# Patient Record
Sex: Male | Born: 1940 | Race: Black or African American | Hispanic: No | Marital: Married | Smoking: Former smoker
Health system: Southern US, Community
[De-identification: ages and names within clinical notes are randomized; demographics above are authoritative.]

## PROBLEM LIST (undated history)

## (undated) DIAGNOSIS — I10 Essential (primary) hypertension: Secondary | ICD-10-CM

## (undated) DIAGNOSIS — Z972 Presence of dental prosthetic device (complete) (partial): Secondary | ICD-10-CM

## (undated) DIAGNOSIS — D649 Anemia, unspecified: Secondary | ICD-10-CM

## (undated) DIAGNOSIS — I739 Peripheral vascular disease, unspecified: Secondary | ICD-10-CM

## (undated) DIAGNOSIS — E785 Hyperlipidemia, unspecified: Secondary | ICD-10-CM

## (undated) DIAGNOSIS — N186 End stage renal disease: Secondary | ICD-10-CM

## (undated) DIAGNOSIS — G709 Myoneural disorder, unspecified: Secondary | ICD-10-CM

## (undated) DIAGNOSIS — M1990 Unspecified osteoarthritis, unspecified site: Secondary | ICD-10-CM

## (undated) DIAGNOSIS — C9110 Chronic lymphocytic leukemia of B-cell type not having achieved remission: Secondary | ICD-10-CM

## (undated) DIAGNOSIS — N189 Chronic kidney disease, unspecified: Secondary | ICD-10-CM

## (undated) HISTORY — PX: COLONOSCOPY: SHX174

## (undated) HISTORY — DX: Chronic kidney disease, unspecified: N18.9

## (undated) HISTORY — DX: Essential (primary) hypertension: I10

## (undated) HISTORY — PX: UPPER GI ENDOSCOPY: SHX6162

## (undated) HISTORY — DX: Hyperlipidemia, unspecified: E78.5

## (undated) HISTORY — PX: FOOT SURGERY: SHX648

---

## 1997-01-11 HISTORY — PX: OTHER SURGICAL HISTORY: SHX169

## 1997-12-04 ENCOUNTER — Ambulatory Visit (HOSPITAL_BASED_OUTPATIENT_CLINIC_OR_DEPARTMENT_OTHER): Admission: RE | Admit: 1997-12-04 | Discharge: 1997-12-04 | Payer: Self-pay | Admitting: *Deleted

## 1998-12-13 ENCOUNTER — Ambulatory Visit (HOSPITAL_COMMUNITY): Admission: RE | Admit: 1998-12-13 | Discharge: 1998-12-13 | Payer: Self-pay | Admitting: Internal Medicine

## 1998-12-13 ENCOUNTER — Encounter (HOSPITAL_BASED_OUTPATIENT_CLINIC_OR_DEPARTMENT_OTHER): Payer: Self-pay | Admitting: Internal Medicine

## 2001-04-24 ENCOUNTER — Encounter: Admission: RE | Admit: 2001-04-24 | Discharge: 2001-07-23 | Payer: Self-pay | Admitting: Internal Medicine

## 2001-08-17 ENCOUNTER — Encounter: Payer: Self-pay | Admitting: Family Medicine

## 2001-08-17 ENCOUNTER — Ambulatory Visit (HOSPITAL_COMMUNITY): Admission: RE | Admit: 2001-08-17 | Discharge: 2001-08-17 | Payer: Self-pay | Admitting: Family Medicine

## 2005-06-09 ENCOUNTER — Emergency Department (HOSPITAL_COMMUNITY): Admission: EM | Admit: 2005-06-09 | Discharge: 2005-06-09 | Payer: Self-pay | Admitting: Emergency Medicine

## 2007-01-12 HISTORY — PX: OTHER SURGICAL HISTORY: SHX169

## 2007-06-08 ENCOUNTER — Observation Stay (HOSPITAL_COMMUNITY): Admission: AD | Admit: 2007-06-08 | Discharge: 2007-06-09 | Payer: Self-pay | Admitting: Orthopedic Surgery

## 2007-06-08 ENCOUNTER — Encounter (INDEPENDENT_AMBULATORY_CARE_PROVIDER_SITE_OTHER): Payer: Self-pay | Admitting: Orthopedic Surgery

## 2008-01-12 HISTORY — PX: TOE AMPUTATION: SHX809

## 2008-11-21 DIAGNOSIS — E785 Hyperlipidemia, unspecified: Secondary | ICD-10-CM | POA: Insufficient documentation

## 2008-11-21 DIAGNOSIS — I10 Essential (primary) hypertension: Secondary | ICD-10-CM | POA: Insufficient documentation

## 2008-11-21 DIAGNOSIS — E1139 Type 2 diabetes mellitus with other diabetic ophthalmic complication: Secondary | ICD-10-CM | POA: Insufficient documentation

## 2008-11-21 DIAGNOSIS — M109 Gout, unspecified: Secondary | ICD-10-CM | POA: Insufficient documentation

## 2010-05-26 NOTE — Op Note (Signed)
NAME:  Thomas Mcguire, Thomas Mcguire             ACCOUNT NO.:  [id number]   MEDICAL RECORD NO.:  [id number]          PATIENT TYPE:  INP   LOCATION:  5036                         FACILITY:  [facility]   PHYSICIAN:  Newt Minion, MD     DATE OF BIRTH:  02-Dec-1940   DATE OF PROCEDURE:  06/08/2007  DATE OF DISCHARGE:                               OPERATIVE REPORT   PREOPERATIVE DIAGNOSIS:  Osteomyelitis and Wagner grade 3 ulcer, left  great toe.   POSTOPERATIVE DIAGNOSIS:  Osteomyelitis and Wagner grade 3 ulcer, left  great toe.   PROCEDURE:  Left great toe amputation at the metatarsophalangeal joint.   SURGEON:  Newt Minion, MD   ANESTHESIA:  General.   ESTIMATED BLOOD LOSS:  Minimal.   ANTIBIOTICS:  Kefzol 1 g.   DRAINS:  None.   COMPLICATIONS:  None.   TOURNIQUET TIME:  None.   DISPOSITION:  To PACU in stable condition.   PROCEDURE:  The patient is a 70 year old gentleman with diabetic  insensate neuropathy with a Wagner grade 3 ulcer and osteomyelitis of  the left great toe, who has failed conservative care with wound care,  p.o. antibiotics, and pressure unloading, and presents at this time for  great toe amputation.  Risks and benefits were discussed including  infection, neurovascular injury, nonhealing of wound, need for  additional surgery, and need for additional amputation.  The patient  states he understands and wished to proceed at this time.   DESCRIPTION OF PROCEDURE:  The patient was brought to OR room-10 and  underwent a general anesthetic.  After adequate level of anesthesia  obtained, the patient's left lower extremity was prepped using DuraPrep  and draped into a sterile field.  An incision was made distal to the MTP  joint of the great toe.  The tissue was reflected and the great toe was  amputated through the MTP joint.  The wound was irrigated with normal  saline.  Electrocautery was used for hemostasis.  The wound was closed  using 2-0 nylon with a modified  vertical mattress  suture.  The local area was infiltrated with total of 10 mL of 0.5%  Marcaine plain.  The wound was covered with Adaptic orthopedic sponges,  Webril, and Coban.  The patient was extubated and taken to PACU in  stable condition.  Plan for overnight observation.      Newt Minion, MD  Electronically Signed     MVD/MEDQ  D:  06/08/2007  T:  06/09/2007  Job:  [id number]

## 2010-08-26 DIAGNOSIS — E669 Obesity, unspecified: Secondary | ICD-10-CM | POA: Insufficient documentation

## 2010-10-07 LAB — BASIC METABOLIC PANEL
BUN: 31 — ABNORMAL HIGH
CO2: 28
Calcium: 8.7
Chloride: 101
Creatinine, Ser: 1.41
GFR calc Af Amer: >60
GFR calc non Af Amer: 50 — ABNORMAL LOW
Glucose, Bld: 92
Potassium: 3.9
Sodium: 136

## 2010-10-07 LAB — CBC
HCT: 37 — ABNORMAL LOW
Hemoglobin: 12 — ABNORMAL LOW
MCHC: 32.6
MCV: 84.6
Platelets: 188
RBC: 4.37
RDW: 15.6 — ABNORMAL HIGH
WBC: 10.7 — ABNORMAL HIGH

## 2010-10-12 ENCOUNTER — Encounter: Payer: Self-pay | Admitting: Gastroenterology

## 2010-10-12 ENCOUNTER — Ambulatory Visit (AMBULATORY_SURGERY_CENTER): Payer: Medicare Other | Admitting: *Deleted

## 2010-10-12 VITALS — Ht 78.0 in | Wt 268.7 lb

## 2010-10-12 DIAGNOSIS — Z1211 Encounter for screening for malignant neoplasm of colon: Secondary | ICD-10-CM

## 2010-10-12 MED ORDER — PEG-KCL-NACL-NASULF-NA ASC-C 100 G PO SOLR: ORAL | Status: DC

## 2010-10-26 ENCOUNTER — Ambulatory Visit (AMBULATORY_SURGERY_CENTER): Payer: Medicare Other | Admitting: Gastroenterology

## 2010-10-26 ENCOUNTER — Encounter: Payer: Self-pay | Admitting: Gastroenterology

## 2010-10-26 VITALS — BP 196/68 | HR 47 | Temp 95.8°F | Resp 18 | Ht 78.0 in | Wt 268.0 lb

## 2010-10-26 DIAGNOSIS — Z1211 Encounter for screening for malignant neoplasm of colon: Secondary | ICD-10-CM

## 2010-10-26 DIAGNOSIS — K5730 Diverticulosis of large intestine without perforation or abscess without bleeding: Secondary | ICD-10-CM

## 2010-10-26 LAB — GLUCOSE, CAPILLARY
Glucose-Capillary: 174 mg/dL — ABNORMAL HIGH (ref 70–99)
Glucose-Capillary: 144 mg/dL — ABNORMAL HIGH (ref 70–99)

## 2010-10-26 MED ORDER — SODIUM CHLORIDE 0.9 % IV SOLN: 500.0000 mL | INTRAVENOUS | Status: DC

## 2010-10-26 NOTE — Patient Instructions (Signed)
Green and blue discharge instructions reviewed with patient and care partner.  Impressions/recommendations:  Diverticulosis (handout given) High Fiber Diet (handout given)  Repeat colonoscopy in 10 years (2022)  Resume medications as you had been taking them prior to your procedure.

## 2010-10-27 ENCOUNTER — Telehealth: Payer: Self-pay | Admitting: *Deleted

## 2010-10-27 NOTE — Telephone Encounter (Signed)

## 2011-08-23 ENCOUNTER — Other Ambulatory Visit (HOSPITAL_COMMUNITY): Payer: Self-pay | Admitting: Orthopedic Surgery

## 2011-08-23 ENCOUNTER — Encounter (HOSPITAL_COMMUNITY): Payer: Self-pay

## 2011-08-23 NOTE — Pre-Procedure Instructions (Signed)
20 Thomas Mcguire  08/23/2011   Your procedure is scheduled on:  August 16th  Report to [facility] at [place].  Call this number if you have problems the morning of surgery: [phone]   Remember:   Do not eat food or drink:After Midnight.  Take these medicines the morning of surgery with A SIP OF WATER: Coreg, allopurinol, vicodin if needed   Do not wear jewelry, make-up or nail polish.  Do not wear lotions, powders, or perfumes.   Do not shave 48 hours prior to surgery. Men may shave face and neck.  Do not bring valuables to the hospital.  Contacts, dentures or bridgework may not be worn into surgery.  Leave suitcase in the car. After surgery it may be brought to your room.  For patients admitted to the hospital, checkout time is 11:00 AM the day of discharge.   Patients discharged the day of surgery will not be allowed to drive home.  Special Instructions: CHG Shower Use Special Wash: 1/2 bottle night before surgery and 1/2 bottle morning of surgery.   Please read over the following fact sheets that you were given: Pain Booklet, Coughing and Deep Breathing, MRSA Information and Surgical Site Infection Prevention

## 2011-08-24 ENCOUNTER — Encounter (HOSPITAL_COMMUNITY)
Admission: RE | Admit: 2011-08-24 | Discharge: 2011-08-24 | Disposition: A | Discharge status: Home / Self Care | Payer: Medicare Other | Source: Ambulatory Visit | Attending: Orthopedic Surgery | Admitting: Orthopedic Surgery

## 2011-08-24 ENCOUNTER — Encounter (HOSPITAL_COMMUNITY): Payer: Self-pay

## 2011-08-24 ENCOUNTER — Ambulatory Visit (HOSPITAL_COMMUNITY)
Admission: RE | Admit: 2011-08-24 | Discharge: 2011-08-24 | Disposition: A | Discharge status: Home / Self Care | Payer: Medicare Other | Source: Ambulatory Visit | Attending: Orthopedic Surgery | Admitting: Orthopedic Surgery

## 2011-08-24 DIAGNOSIS — Z01812 Encounter for preprocedural laboratory examination: Secondary | ICD-10-CM | POA: Insufficient documentation

## 2011-08-24 DIAGNOSIS — Z01810 Encounter for preprocedural cardiovascular examination: Secondary | ICD-10-CM | POA: Insufficient documentation

## 2011-08-24 DIAGNOSIS — Z01818 Encounter for other preprocedural examination: Secondary | ICD-10-CM | POA: Insufficient documentation

## 2011-08-24 HISTORY — DX: Unspecified osteoarthritis, unspecified site: M19.90

## 2011-08-24 HISTORY — DX: Myoneural disorder, unspecified: G70.9

## 2011-08-24 LAB — PROTIME-INR
INR: 1.18 (ref 0.00–1.49)
Prothrombin Time: 15.3 seconds — ABNORMAL HIGH (ref 11.6–15.2)

## 2011-08-24 LAB — SURGICAL PCR SCREEN
MRSA, PCR: NEGATIVE
Staphylococcus aureus: NEGATIVE

## 2011-08-24 LAB — COMPREHENSIVE METABOLIC PANEL
ALT: 16 U/L (ref 0–53)
AST: 18 U/L (ref 0–37)
Albumin: 3.5 g/dL (ref 3.5–5.2)
Alkaline Phosphatase: 59 U/L (ref 39–117)
BUN: 37 mg/dL — ABNORMAL HIGH (ref 6–23)
CO2: 29 mEq/L (ref 19–32)
Calcium: 9.2 mg/dL (ref 8.4–10.5)
Chloride: 105 mEq/L (ref 96–112)
Creatinine, Ser: 1.66 mg/dL — ABNORMAL HIGH (ref 0.50–1.35)
GFR calc Af Amer: 47 mL/min — ABNORMAL LOW (ref >90)
GFR calc non Af Amer: 40 mL/min — ABNORMAL LOW (ref >90)
Glucose, Bld: 143 mg/dL — ABNORMAL HIGH (ref 70–99)
Potassium: 4.6 mEq/L (ref 3.5–5.1)
Sodium: 142 mEq/L (ref 135–145)
Total Bilirubin: 0.4 mg/dL (ref 0.3–1.2)
Total Protein: 6.7 g/dL (ref 6.0–8.3)

## 2011-08-24 LAB — CBC
HCT: 37.6 % — ABNORMAL LOW (ref 39.0–52.0)
Hemoglobin: 12.2 g/dL — ABNORMAL LOW (ref 13.0–17.0)
MCH: 27.2 pg (ref 26.0–34.0)
MCHC: 32.4 g/dL (ref 30.0–36.0)
MCV: 83.7 fL (ref 78.0–100.0)
Platelets: 134 10*3/uL — ABNORMAL LOW (ref 150–400)
RBC: 4.49 MIL/uL (ref 4.22–5.81)
RDW: 16.1 % — ABNORMAL HIGH (ref 11.5–15.5)
WBC: 11 10*3/uL — ABNORMAL HIGH (ref 4.0–10.5)

## 2011-08-24 LAB — APTT: aPTT: 28 seconds (ref 24–37)

## 2011-08-24 NOTE — Consult Note (Addendum)
Anesthesia chart review: Patient is a 71 year old male scheduled for left foot third toe amputation at the  metatarsophalangeal joint on 08/27/2011 by Dr. Sharol Given.  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 PCP is Dr. Bevelyn Buckles.  Labs noted.  BUN/Cr 37/1.66, WBC 11.0, H/H 12.2/37.6, PLT 134, glucose 143, PTT, 28, PT/INR 15.3/1.18.  His last currently available comparison Cr was 1.41 from 06/08/07.) He will get a CBG on arrival.  His coags appear reasonable for this procedure (he is not on anti-coagulation therapy).  His Cr is elevated at 1.66, but he has known chronic RI.  Will not plan to repeat labs preoperatively.     Chest x-ray on 08/24/2011 showed stable mild elevation of the right hemidiaphragm, no active disease.  EKG on 08/24/11 showed SB @ 59, LAE.  Inferior ST abnormality has resolved since May 2009 (see Muse).  Anticipate he can proceed as planned in no significant change in his status.  Myra Gianotti, PA-C

## 2011-08-25 NOTE — Progress Notes (Signed)
Second request to [facility] to see if they have results of Stress Test or know where pt. Had the test done.

## 2011-08-26 MED ORDER — CEFAZOLIN SODIUM-DEXTROSE 2-3 GM-% IV SOLR
2.0000 g | INTRAVENOUS | Status: AC
  Administered 2011-08-27: 2 g via INTRAVENOUS
  Filled 2011-08-26: qty 50

## 2011-08-26 NOTE — Progress Notes (Signed)
Dr. Philip Aspen 's office has no record of any cardiac studies or listing of cardiology physican.

## 2011-08-27 ENCOUNTER — Encounter (HOSPITAL_COMMUNITY)
Admission: RE | Disposition: A | Discharge status: Home / Self Care | Payer: Self-pay | Source: Ambulatory Visit | Attending: Orthopedic Surgery

## 2011-08-27 ENCOUNTER — Encounter (HOSPITAL_COMMUNITY): Payer: Self-pay | Admitting: Vascular Surgery

## 2011-08-27 ENCOUNTER — Ambulatory Visit (HOSPITAL_COMMUNITY): Payer: Medicare Other | Admitting: Vascular Surgery

## 2011-08-27 ENCOUNTER — Ambulatory Visit (HOSPITAL_COMMUNITY)
Admission: RE | Admit: 2011-08-27 | Discharge: 2011-08-27 | Disposition: A | Discharge status: Home / Self Care | Payer: Medicare Other | Source: Ambulatory Visit | Attending: Orthopedic Surgery | Admitting: Orthopedic Surgery

## 2011-08-27 ENCOUNTER — Encounter (HOSPITAL_COMMUNITY): Payer: Self-pay | Admitting: *Deleted

## 2011-08-27 DIAGNOSIS — I129 Hypertensive chronic kidney disease with stage 1 through stage 4 chronic kidney disease, or unspecified chronic kidney disease: Secondary | ICD-10-CM | POA: Insufficient documentation

## 2011-08-27 DIAGNOSIS — M9080 Osteopathy in diseases classified elsewhere, unspecified site: Secondary | ICD-10-CM | POA: Insufficient documentation

## 2011-08-27 DIAGNOSIS — M869 Osteomyelitis, unspecified: Secondary | ICD-10-CM | POA: Insufficient documentation

## 2011-08-27 DIAGNOSIS — N189 Chronic kidney disease, unspecified: Secondary | ICD-10-CM | POA: Insufficient documentation

## 2011-08-27 DIAGNOSIS — E1169 Type 2 diabetes mellitus with other specified complication: Secondary | ICD-10-CM | POA: Insufficient documentation

## 2011-08-27 DIAGNOSIS — E1149 Type 2 diabetes mellitus with other diabetic neurological complication: Secondary | ICD-10-CM | POA: Insufficient documentation

## 2011-08-27 DIAGNOSIS — E1142 Type 2 diabetes mellitus with diabetic polyneuropathy: Secondary | ICD-10-CM | POA: Insufficient documentation

## 2011-08-27 DIAGNOSIS — E785 Hyperlipidemia, unspecified: Secondary | ICD-10-CM | POA: Insufficient documentation

## 2011-08-27 HISTORY — PX: AMPUTATION: SHX166

## 2011-08-27 LAB — GLUCOSE, CAPILLARY
Glucose-Capillary: 138 mg/dL — ABNORMAL HIGH (ref 70–99)
Glucose-Capillary: 152 mg/dL — ABNORMAL HIGH (ref 70–99)

## 2011-08-27 SURGERY — AMPUTATION DIGIT
Anesthesia: Choice | Site: Foot | Laterality: Left | Wound class: Clean Contaminated

## 2011-08-27 MED ORDER — FENTANYL CITRATE 0.05 MG/ML IJ SOLN
INTRAMUSCULAR | Status: DC | PRN
  Administered 2011-08-27: 100 ug via INTRAVENOUS

## 2011-08-27 MED ORDER — PROMETHAZINE HCL 25 MG/ML IJ SOLN: 6.2500 mg | INTRAMUSCULAR | Status: DC | PRN

## 2011-08-27 MED ORDER — HYDROMORPHONE HCL PF 1 MG/ML IJ SOLN
0.2500 mg | INTRAMUSCULAR | Status: DC | PRN
  Administered 2011-08-27 (×2): 0.5 mg via INTRAVENOUS

## 2011-08-27 MED ORDER — 0.9 % SODIUM CHLORIDE (POUR BTL) OPTIME
TOPICAL | Status: DC | PRN
  Administered 2011-08-27: 1000 mL

## 2011-08-27 MED ORDER — MIDAZOLAM HCL 2 MG/2ML IJ SOLN: 1.0000 mg | INTRAMUSCULAR | Status: DC | PRN

## 2011-08-27 MED ORDER — PROPOFOL 10 MG/ML IV EMUL
INTRAVENOUS | Status: DC | PRN
  Administered 2011-08-27: 200 mg via INTRAVENOUS

## 2011-08-27 MED ORDER — HYDROMORPHONE HCL PF 1 MG/ML IJ SOLN
INTRAMUSCULAR | Status: AC
  Filled 2011-08-27: qty 1

## 2011-08-27 MED ORDER — HYDROCODONE-ACETAMINOPHEN 5-500 MG PO TABS: 1.0000 | ORAL_TABLET | Freq: Four times a day (QID) | ORAL | Status: AC | PRN

## 2011-08-27 MED ORDER — LIDOCAINE HCL (CARDIAC) 20 MG/ML IV SOLN
INTRAVENOUS | Status: DC | PRN
  Administered 2011-08-27: 40 mg via INTRAVENOUS

## 2011-08-27 MED ORDER — FENTANYL CITRATE 0.05 MG/ML IJ SOLN: 50.0000 ug | INTRAMUSCULAR | Status: DC | PRN

## 2011-08-27 MED ORDER — LACTATED RINGERS IV SOLN
INTRAVENOUS | Status: DC | PRN
  Administered 2011-08-27: 07:00:00 via INTRAVENOUS

## 2011-08-27 SURGICAL SUPPLY — 48 items
BANDAGE GAUZE 4  KLING STR (GAUZE/BANDAGES/DRESSINGS) IMPLANT
BANDAGE GAUZE ELAST BULKY 4 IN (GAUZE/BANDAGES/DRESSINGS) ×2 IMPLANT
BLADE AVERAGE 25X9 (BLADE) IMPLANT
BLADE MINI RND TIP GREEN BEAV (BLADE) IMPLANT
BNDG COHESIVE 1X5 TAN STRL LF (GAUZE/BANDAGES/DRESSINGS) IMPLANT
BNDG COHESIVE 6X5 TAN STRL LF (GAUZE/BANDAGES/DRESSINGS) ×2 IMPLANT
BNDG ESMARK 4X9 LF (GAUZE/BANDAGES/DRESSINGS) ×2 IMPLANT
BNDG GAUZE STRTCH 6 (GAUZE/BANDAGES/DRESSINGS) IMPLANT
CLOTH BEACON ORANGE TIMEOUT ST (SAFETY) ×2 IMPLANT
CORDS BIPOLAR (ELECTRODE) ×2 IMPLANT
COVER SURGICAL LIGHT HANDLE (MISCELLANEOUS) ×2 IMPLANT
CUFF TOURNIQUET SINGLE 18IN (TOURNIQUET CUFF) IMPLANT
CUFF TOURNIQUET SINGLE 24IN (TOURNIQUET CUFF) IMPLANT
CUFF TOURNIQUET SINGLE 34IN LL (TOURNIQUET CUFF) IMPLANT
CUFF TOURNIQUET SINGLE 44IN (TOURNIQUET CUFF) IMPLANT
DRAPE U-SHAPE 47X51 STRL (DRAPES) ×2 IMPLANT
DRSG ADAPTIC 3X8 NADH LF (GAUZE/BANDAGES/DRESSINGS) ×2 IMPLANT
DRSG PAD ABDOMINAL 8X10 ST (GAUZE/BANDAGES/DRESSINGS) ×2 IMPLANT
DURAPREP 26ML APPLICATOR (WOUND CARE) ×2 IMPLANT
ELECT REM PT RETURN 9FT ADLT (ELECTROSURGICAL) ×2
ELECTRODE REM PT RTRN 9FT ADLT (ELECTROSURGICAL) ×1 IMPLANT
GAUZE SPONGE 2X2 8PLY STRL LF (GAUZE/BANDAGES/DRESSINGS) IMPLANT
GLOVE BIOGEL PI IND STRL 9 (GLOVE) ×1 IMPLANT
GLOVE BIOGEL PI INDICATOR 9 (GLOVE) ×1
GLOVE SURG ORTHO 9.0 STRL STRW (GLOVE) ×2 IMPLANT
GOWN PREVENTION PLUS XLARGE (GOWN DISPOSABLE) ×2 IMPLANT
GOWN SRG XL XLNG 56XLVL 4 (GOWN DISPOSABLE) ×1 IMPLANT
GOWN STRL NON-REIN XL XLG LVL4 (GOWN DISPOSABLE) ×1
KIT BASIN OR (CUSTOM PROCEDURE TRAY) ×2 IMPLANT
KIT ROOM TURNOVER OR (KITS) ×2 IMPLANT
MANIFOLD NEPTUNE II (INSTRUMENTS) IMPLANT
NEEDLE HYPO 25GX1X1/2 BEV (NEEDLE) IMPLANT
NS IRRIG 1000ML POUR BTL (IV SOLUTION) ×2 IMPLANT
PACK ORTHO EXTREMITY (CUSTOM PROCEDURE TRAY) ×2 IMPLANT
PAD ARMBOARD 7.5X6 YLW CONV (MISCELLANEOUS) ×4 IMPLANT
PAD CAST 4YDX4 CTTN HI CHSV (CAST SUPPLIES) IMPLANT
PADDING CAST COTTON 4X4 STRL (CAST SUPPLIES)
SPECIMEN JAR SMALL (MISCELLANEOUS) ×2 IMPLANT
SPONGE GAUZE 2X2 STER 10/PKG (GAUZE/BANDAGES/DRESSINGS)
SPONGE GAUZE 4X4 12PLY (GAUZE/BANDAGES/DRESSINGS) ×2 IMPLANT
SUCTION FRAZIER TIP 10 FR DISP (SUCTIONS) IMPLANT
SUT ETHILON 2 0 PSLX (SUTURE) IMPLANT
SUT VIC AB 2-0 FS1 27 (SUTURE) IMPLANT
SYR CONTROL 10ML LL (SYRINGE) IMPLANT
TOWEL OR 17X24 6PK STRL BLUE (TOWEL DISPOSABLE) ×2 IMPLANT
TOWEL OR 17X26 10 PK STRL BLUE (TOWEL DISPOSABLE) ×2 IMPLANT
TUBE CONNECTING 12X1/4 (SUCTIONS) IMPLANT
WATER STERILE IRR 1000ML POUR (IV SOLUTION) ×2 IMPLANT

## 2011-08-27 NOTE — Op Note (Signed)
OPERATIVE REPORT  DATE OF SURGERY: 08/27/2011  PATIENT:  Thomas Mcguire,  71 y.o. male  PRE-OPERATIVE DIAGNOSIS:  osteomyelitis Left foot 3rd Toe  POST-OPERATIVE DIAGNOSIS:  osteomyelitis Left foot 3rd Toe  PROCEDURE:  Procedure(s): AMPUTATION DIGIT  SURGEON:  Surgeon(s): Newt Minion, MD  ANESTHESIA:   general  EBL:  Minimal ML  SPECIMEN:  Source of Specimen:  Left foot third toe  TOURNIQUET:  * No tourniquets in log *  PROCEDURE DETAILS: Patient is a 71 year old gentleman who is status post great toe and second toe amputation with diabetic neuropathy who presents with osteomyelitis of the left foot third toe he has failed conservative care presents at this time for surgical intervention. Risks and benefits were discussed patient states he understands was to proceed at this time. Description of procedure patient was brought to the OR and underwent a general anesthetic. After adequate levels of anesthesia were obtained patient's left lower extremity was prepped using DuraPrep and draped into a sterile field. A racquet incision was made distal to the MTP joint. The third toe was amputated through the MTP joint. Hemostasis was obtained the wound is irrigated with normal saline the incision was closed using 2-0 nylon. The wound was covered with Adaptic orthopedic sponges AB dressing Kerlix and Coban. Patient was extubated taken to the PACU in stable condition plan for discharge to home.  PLAN OF CARE: Discharge to home after PACU  PATIENT DISPOSITION:  PACU - hemodynamically stable.   Newt Minion, MD 08/27/2011 8:09 AM

## 2011-08-27 NOTE — Progress Notes (Signed)
Orthopedic Tech Progress Note Patient Details:  Thomas Mcguire 09-11-1940 [id number]  Ortho Devices Type of Ortho Device: Postop boot Ortho Device/Splint Interventions: Application   [name] 08/27/2011, 8:35 AM

## 2011-08-27 NOTE — Anesthesia Postprocedure Evaluation (Signed)
  Anesthesia Post-op Note  Patient: Thomas Mcguire  Procedure(s) Performed: Procedure(s) (LRB): AMPUTATION DIGIT (Left)  Patient Location: PACU  Anesthesia Type: General  Level of Consciousness: awake  Airway and Oxygen Therapy: Patient Spontanous Breathing  Post-op Pain: mild  Post-op Assessment: Post-op Vital signs reviewed, Patient's Cardiovascular Status Stable, Respiratory Function Stable, Patent Airway, No signs of Nausea or vomiting and Pain level controlled  Post-op Vital Signs: stable  Complications: No apparent anesthesia complications

## 2011-08-27 NOTE — Anesthesia Procedure Notes (Signed)
Procedure Name: LMA Insertion Date/Time: 08/27/2011 7:53 AM Performed by: Manuela Schwartz B Pre-anesthesia Checklist: Patient identified, Emergency Drugs available, Suction available, Patient being monitored and Timeout performed Patient Re-evaluated:Patient Re-evaluated prior to inductionOxygen Delivery Method: Circle system utilized Preoxygenation: Pre-oxygenation with 100% oxygen Intubation Type: IV induction LMA: LMA inserted LMA Size: 5.0 Number of attempts: 1 Placement Confirmation: positive ETCO2 and breath sounds checked- equal and bilateral Tube secured with: Tape Dental Injury: Teeth and Oropharynx as per pre-operative assessment

## 2011-08-27 NOTE — Anesthesia Preprocedure Evaluation (Addendum)
Anesthesia Evaluation  Patient identified by MRN, date of birth, ID band Patient awake    Reviewed: Allergy & Precautions, H&P , NPO status , Patient's Chart, lab work & pertinent test results, reviewed documented beta blocker date and time   History of Anesthesia Complications Negative for: history of anesthetic complications  Airway Mallampati: II TM Distance: >3 FB Neck ROM: Full    Dental  (+) Edentulous Upper and Edentulous Lower   Pulmonary former smoker (quit 15 years ago),    Pulmonary exam normal       Cardiovascular hypertension, Pt. on medications and Pt. on home beta blockers     Neuro/Psych  Neuromuscular disease    GI/Hepatic   Endo/Other  Type 2, Insulin Dependent  Renal/GU Renal InsufficiencyRenal disease     Musculoskeletal   Abdominal   Peds  Hematology   Anesthesia Other Findings   Reproductive/Obstetrics                         Anesthesia Physical Anesthesia Plan  ASA: III  Anesthesia Plan: General   Post-op Pain Management:    Induction: Intravenous  Airway Management Planned: LMA  Additional Equipment:   Intra-op Plan:   Post-operative Plan:   Informed Consent: I have reviewed the patients History and Physical, chart, labs and discussed the procedure including the risks, benefits and alternatives for the proposed anesthesia with the patient or authorized representative who has indicated his/her understanding and acceptance.     Plan Discussed with: Surgeon and CRNA  Anesthesia Plan Comments:       Anesthesia Quick Evaluation

## 2011-08-27 NOTE — Progress Notes (Signed)
Report given to rebecca rn as caregiver

## 2011-08-27 NOTE — Transfer of Care (Signed)
Immediate Anesthesia Transfer of Care Note  Patient: Thomas Mcguire  Procedure(s) Performed: Procedure(s) (LRB): AMPUTATION DIGIT (Left)  Patient Location: PACU  Anesthesia Type: General  Level of Consciousness: awake, alert  and oriented  Airway & Oxygen Therapy: Patient Spontanous Breathing  Post-op Assessment: Report given to PACU RN and Post -op Vital signs reviewed and stable  Post vital signs: Reviewed and stable  Complications: No apparent anesthesia complications

## 2011-08-27 NOTE — H&P (Signed)
Thomas Mcguire is an 71 y.o. male.   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  Past Medical History  Diagnosis Date  . Gout     Has not had recently- 08/24/11  . Diabetes mellitus   . Hypertension   . Hyperlipidemia   . Chronic renal insufficiency     CHECKED Q3MOS PER PT. NO TREATMENT  . Neuromuscular disorder     Neuropathy  . Arthritis     Past Surgical History  Procedure Date  . Knee arthroscopy     left for torn miniscus tear  . Cyst elbow 1999    right  . Amputation great toe 2009    left  . Toe amputation 2010    left 2nd toe  . Colonoscopy     Family History  Problem Relation Age of Onset  . Colon cancer Neg Hx    Social History:  reports that he quit smoking about 15 years ago. He has never used smokeless tobacco. He reports that he does not drink alcohol or use illicit drugs.  Allergies: No Known Allergies  Medications Prior to Admission  Medication Sig Dispense Refill  . allopurinol (ZYLOPRIM) 300 MG tablet Take 300 mg by mouth daily.        Marland Kitchen aspirin 325 MG tablet Take 325 mg by mouth daily.        . carvedilol (COREG) 25 MG tablet Take 25 mg by mouth 2 (two) times daily with a meal.      . COD LIVER OIL PO Take 1 capsule by mouth daily.       Marland Kitchen doxazosin (CARDURA) 2 MG tablet Take 2 mg by mouth at bedtime.      . furosemide (LASIX) 40 MG tablet Take 80 mg by mouth daily.       Marland Kitchen guanFACINE (TENEX) 2 MG tablet Take 2 mg by mouth every morning.       Marland Kitchen HYDROcodone-acetaminophen (VICODIN) 5-500 MG per tablet Take 1-2 tablets by mouth daily as needed. For pain      . insulin NPH (HUMULIN N,NOVOLIN N) 100 UNIT/ML injection Inject 50 Units into the skin 2 (two) times daily. Inject 50 units in am and 50 units in pm      . insulin  regular (HUMULIN R,NOVOLIN R) 100 units/mL injection Inject 22 Units into the skin 3 (three) times daily before meals.       Marland Kitchen lisinopril (PRINIVIL,ZESTRIL) 20 MG tablet Take 10 mg by mouth 2 (two) times daily.       . pravastatin (PRAVACHOL) 40 MG tablet Take 40 mg by mouth daily.          No results found for this or any previous visit (from the past 48 hour(s)). No results found.  Review of Systems  All other systems reviewed and are negative.    Blood pressure 146/77, pulse 57, temperature 98.4 F (36.9 C), temperature source Oral, resp. rate 18, SpO2 98.00%. Physical Exam  On examination patient has a palpable dorsalis pedis pulse there is a plantar ulcer left foot third toe which probes to bone. Radiograph shows osteomyelitis of the third toe. Her swelling and redness. Assessment/Plan Assessment: Osteomyelitis and ulceration left foot third toe with diabetic insensate neuropathy.  Plan: We will plan for a left foot third toe amputation. Risks and  benefits were discussed including persistent infection nonhealing of the wound need for additional surgery. Patient states he understands and wished to proceed at this time.  DUDA,MARCUS V 08/27/2011, 6:28 AM

## 2011-08-30 ENCOUNTER — Encounter (HOSPITAL_COMMUNITY): Payer: Self-pay | Admitting: Orthopedic Surgery

## 2013-10-09 ENCOUNTER — Other Ambulatory Visit: Payer: Self-pay | Admitting: Physician Assistant

## 2014-04-26 DIAGNOSIS — C61 Malignant neoplasm of prostate: Secondary | ICD-10-CM | POA: Insufficient documentation

## 2014-11-18 ENCOUNTER — Encounter: Payer: Self-pay | Admitting: Gastroenterology

## 2015-04-14 DIAGNOSIS — Z7189 Other specified counseling: Secondary | ICD-10-CM | POA: Insufficient documentation

## 2015-04-14 DIAGNOSIS — Z0000 Encounter for general adult medical examination without abnormal findings: Secondary | ICD-10-CM | POA: Insufficient documentation

## 2016-07-26 DIAGNOSIS — I872 Venous insufficiency (chronic) (peripheral): Secondary | ICD-10-CM | POA: Insufficient documentation

## 2016-11-08 DIAGNOSIS — M25571 Pain in right ankle and joints of right foot: Secondary | ICD-10-CM | POA: Insufficient documentation

## 2016-12-07 ENCOUNTER — Encounter: Payer: Medicare Other | Attending: Internal Medicine | Admitting: Internal Medicine

## 2016-12-07 DIAGNOSIS — Z794 Long term (current) use of insulin: Secondary | ICD-10-CM | POA: Diagnosis not present

## 2016-12-07 DIAGNOSIS — H409 Unspecified glaucoma: Secondary | ICD-10-CM | POA: Insufficient documentation

## 2016-12-07 DIAGNOSIS — I87311 Chronic venous hypertension (idiopathic) with ulcer of right lower extremity: Secondary | ICD-10-CM | POA: Diagnosis not present

## 2016-12-07 DIAGNOSIS — Z87891 Personal history of nicotine dependence: Secondary | ICD-10-CM | POA: Diagnosis not present

## 2016-12-07 DIAGNOSIS — I10 Essential (primary) hypertension: Secondary | ICD-10-CM | POA: Diagnosis not present

## 2016-12-07 DIAGNOSIS — L97211 Non-pressure chronic ulcer of right calf limited to breakdown of skin: Secondary | ICD-10-CM | POA: Diagnosis not present

## 2016-12-07 DIAGNOSIS — M109 Gout, unspecified: Secondary | ICD-10-CM | POA: Insufficient documentation

## 2016-12-07 DIAGNOSIS — E1142 Type 2 diabetes mellitus with diabetic polyneuropathy: Secondary | ICD-10-CM | POA: Insufficient documentation

## 2016-12-07 DIAGNOSIS — E11622 Type 2 diabetes mellitus with other skin ulcer: Secondary | ICD-10-CM | POA: Diagnosis present

## 2016-12-08 NOTE — Progress Notes (Addendum)
Thomas Mcguire ([id number]) Visit Report for 12/07/2016 Chief Complaint Document Details Patient Name: Thomas Mcguire, Thomas Mcguire. Date of Service: 12/07/2016 8:00 AM Medical Record Number: [id number] Patient Account Number: [id number] Date of Birth/Sex: Jun 02, 1940 (76 y.o. Male) Treating RN: Roger Shelter Primary Care Provider: Leanna Battles Other Clinician: Referring Provider: Leanna Battles Treating Provider/Extender: Ricard Dillon Weeks in Treatment: 0 Information Obtained from: Patient Chief Complaint 12/07/16; patient is here for review of a wound on the right lower leg Electronic Signature(s) Signed: 12/07/2016 4:33:39 PM By: Linton Ham MD Entered By: Linton Ham on 12/07/2016 09:34:50 [name] ([id number]) -------------------------------------------------------------------------------- HPI Details Patient Name: Thomas Mcguire. Date of Service: 12/07/2016 8:00 AM Medical Record Number: [id number] Patient Account Number: [id number] Date of Birth/Sex: 25-Nov-1940 (76 y.o. 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 He is here for review of wound on the right lower leg. 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 He has chronic edema in his lower legs and uses 20-30 mm compression stockings which she is compliant with. He is a nonsmoker. Beside his diabetes he has hypertension and hypercholesterolemia and in 2013 he had stage III chronic renal failure. He tells me his primary physician Dr. Valetta Fuller at [facility] follows him every 3 months for his kidney failure. He also states he saw a  nephrologist once. ABIs in this clinic were noncompressible. He tells me he saw Dr. Doren Custard in 6 or 7 years ago at [facility] in [place]. He does not remember what the issue was. It would appear that the patient has had recurrent blistering in the lower extremities as there are several healed areas on the left anterior and right anterior calves Electronic Signature(s) Signed: 12/07/2016 4:33:39 PM By: Linton Ham MD Entered By: Linton Ham on 12/07/2016 09:04:15 [name] ([id number]) -------------------------------------------------------------------------------- Physical Exam Details Patient Name: Thomas Mcguire. Date of Service: 12/07/2016 8:00 AM Medical Record Number: [id number] Patient Account Number: [id number] Date of Birth/Sex: 09/14/1940 (76 y.o. Male) Treating RN: Roger Shelter Primary Care Provider: Leanna Battles Other Clinician: Referring Provider: Leanna Battles Treating Provider/Extender: Ricard Dillon Weeks in Treatment: 0 Constitutional Patient is hypertensive.. Pulse regular and within target range for patient.Marland Kitchen Respirations regular, non-labored and within target range.. Temperature is normal and within the target range for the patient.Marland Kitchen appears in no distress. Eyes Conjunctivae clear. No discharge. Respiratory Respiratory effort is easy and symmetric bilaterally. Rate is normal at rest and on room air.. Bilateral breath sounds are clear and equal in all lobes with no wheezes, rales or rhonchi.. Cardiovascular Heart rhythm and rate regular, without murmur or gallop. JVP is not elevated. Dorsalis pedis pulses are faintly palpable bilaterally. I could not feel his posterior tibial. Popliteal pulses are palpable. Edema present in both extremities. This is 3+ bilaterally and limited to below the knees. Gastrointestinal (GI) No masses. Lymphatic None palpable in the popliteal or inguinal area. Integumentary (Hair, Skin) No rash.  There are changes of chronic venous insufficiency right greater than the left hemosiderin deposition. Neurological Reduced vibration sense in both feet microfilament test also reduced. Psychiatric No evidence of depression, anxiety, or agitation. Calm, cooperative, and communicative. Appropriate interactions and affect.. Notes Wound exam; the  patient has a small dime-sized wound on the right medial lower extremity. Base of the wound looks healthy and there is already some epithelialization. There is several scarred areas on the bilateral anterior legs looks like from previous wounds although the history he gave was somewhat vague. He has 2-3+ pitting edema bilaterally below the knees. No signs of a DVT or cellulitis. The edema is pitting. Do not see an obvious systemic cause for fluid overload i.e. no signs of volume overload Electronic Signature(s) Signed: 12/07/2016 4:33:39 PM By: Linton Ham MD Entered By: Linton Ham on 12/07/2016 09:07:37 [name] ([id number]) -------------------------------------------------------------------------------- Physician Orders Details Patient Name: Thomas Mcguire. Date of Service: 12/07/2016 8:00 AM Medical Record Number: [id number] Patient Account Number: [id number] Date of Birth/Sex: Feb 13, 1940 (76 y.o. Male) Treating RN: Roger Shelter Primary Care Provider: Leanna Battles Other Clinician: Referring Provider: Leanna Battles Treating Provider/Extender: Tito Dine in Treatment: 0 Verbal / Phone Orders: No Diagnosis Coding Wound Cleansing Wound #1 Right,Medial Lower Leg o Clean wound with Normal Saline. Anesthetic Wound #1 Right,Medial Lower Leg o Topical Lidocaine 4% cream applied to wound bed prior to debridement Primary Wound Dressing Wound #1 Right,Medial Lower Leg o Other: - silvercel Secondary Dressing Wound #1 Right,Medial Lower Leg o ABD pad Dressing Change Frequency Wound #1  Right,Medial Lower Leg o Change dressing every week Follow-up Appointments Wound #1 Right,Medial Lower Leg o Return Appointment in 1 week. Edema Control o 3 Layer Compression System - Right Lower Extremity Services and Therapies o Arterial Studies- Bilateral - [facility] Notes Referral made to [facility] for arterial studies of lower extremities bilateral. Electronic Signature(s) Signed: 12/09/2016 4:11:09 PM By: Roger Shelter Signed: 12/21/2016 2:49:40 PM By: Linton Ham MD Previous Signature: 12/07/2016 4:33:39 PM Version By: Linton Ham MD Previous Signature: 12/07/2016 4:40:58 PM Version By: Roger Shelter Entered By: Roger Shelter on 12/09/2016 11:41:19 [name] ([id number]) -------------------------------------------------------------------------------- Problem List Details Patient Name: LATEEF, JUNCAJ. Date of Service: 12/07/2016 8:00 AM Medical Record Number: [id number] Patient Account Number: [id number] Date of Birth/Sex: 1940-05-05 (76 y.o. 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 Date of Service: 12/07/2016 8:00 AM Medical Record Number: [id number] Patient Account Number: [id number] Date of Birth/Sex: October 15, 1940 (76 y.o. 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 He is here for review of wound on the right lower leg. 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 He has chronic edema in his lower legs and uses 20-30  mm compression stockings which she is compliant with. He is a nonsmoker. Beside his diabetes he has hypertension and hypercholesterolemia and in 2013 he had stage III chronic renal failure. He tells me his primary physician Dr. Valetta Fuller at [facility] follows him every 3 months for his kidney failure. He also states he saw a nephrologist once. ABIs in this clinic were noncompressible. He tells me he saw Dr. Doren Custard in 6 or 7 years ago at [facility] vascular in [place]. He does not remember what the issue was. It would appear that the patient has had recurrent blistering in the lower extremities as there are several healed areas on the left anterior and right anterior calves Wound History Patient presents with 1 open wound that has been present for approximately 1 week. Patient has been treating wound in the following manner: 1 week. Laboratory tests have not been performed in the last month. Patient reportedly has not tested positive for an antibiotic resistant organism. Patient reportedly has not tested positive for osteomyelitis.  Patient reportedly has had testing performed to evaluate circulation in the legs. Patient History Information obtained from Patient. Allergies No Known Drug Allergies Family History Diabetes - Mother,Father, Hypertension - Mother, No family history of Cancer, Heart Disease, Kidney Disease, Lung Disease, Seizures, Stroke, Thyroid Problems, Tuberculosis. Social History Former smoker - quit 24 years ago, Marital Status - Married, Alcohol Use - Never, Drug Use - No History, Caffeine Use - Daily. Medical History Eyes Patient has history of Glaucoma Denies history of Cataracts, Optic Neuritis Ear/Nose/Mouth/Throat LORREN, SPLAWN ([id number]) Denies history of Chronic sinus problems/congestion, Middle ear problems Hematologic/Lymphatic Patient has history of Lymphedema Denies history of Anemia, Hemophilia, Human Immunodeficiency Virus, Sickle Cell Disease Respiratory Denies history of Aspiration, Asthma, Chronic Obstructive Pulmonary Disease (COPD), Pneumothorax, Sleep Apnea, Tuberculosis Cardiovascular Patient has history of Hypertension Denies history of Angina, Arrhythmia, Congestive Heart Failure, Coronary Artery Disease, Deep Vein Thrombosis, Myocardial Infarction, Peripheral Arterial Disease, Peripheral Venous Disease, Phlebitis, Vasculitis Gastrointestinal Denies history of Cirrhosis , Colitis, Crohn s, Hepatitis A, Hepatitis B, Hepatitis C Endocrine Patient has history of Type II Diabetes - insulin dependant Genitourinary Denies history of End Stage Renal Disease Immunological Denies history of Lupus Erythematosus, Raynaud s, Scleroderma Integumentary (Skin) Denies history of History of Burn, History of pressure wounds Musculoskeletal Patient has history of Gout Denies history of Rheumatoid Arthritis, Osteoarthritis, Osteomyelitis Neurologic Denies history of Dementia, Neuropathy, Quadriplegia, Paraplegia, Seizure Disorder Oncologic Denies history of Received  Chemotherapy, Received Radiation Psychiatric Denies history of Anorexia/bulimia, Confinement Anxiety Patient is treated with Insulin. Blood sugar is tested. Blood sugar results noted at the following times: Breakfast - 120, Bedtime - 140. Review of Systems (ROS) Constitutional Symptoms (General Health) Denies complaints or symptoms of Fatigue, Fever, Chills, Marked Weight Change. Eyes Complains or has symptoms of Glasses / Contacts - glasses. Denies complaints or symptoms of Dry Eyes. Ear/Nose/Mouth/Throat Denies complaints or symptoms of Difficult clearing ears, Sinusitis. Hematologic/Lymphatic Denies complaints or symptoms of Bleeding / Clotting Disorders, Human Immunodeficiency Virus. Respiratory Denies complaints or symptoms of Chronic or frequent coughs, Shortness of Breath. Cardiovascular Complains or has symptoms of LE edema. Denies complaints or symptoms of Chest pain. Gastrointestinal Denies complaints or symptoms of Frequent diarrhea, Nausea, Vomiting. Endocrine Denies complaints or symptoms of Hepatitis, Thyroid disease, Polydypsia (Excessive Thirst). Genitourinary Denies complaints or symptoms of Kidney failure/ Dialysis, Incontinence/dribbling. Immunological Denies complaints or symptoms of Hives, Itching. Integumentary (Skin) Complains or has symptoms of Wounds, Swelling. Denies complaints or symptoms of  Bleeding or bruising tendency, Breakdown. ZAKIAH, BECKERMAN [place] ([id number]) Musculoskeletal Denies complaints or symptoms of Muscle Pain, Muscle Weakness. Neurologic Denies complaints or symptoms of Numbness/parasthesias, Focal/Weakness. Oncologic The patient has no complaints or symptoms. Psychiatric Denies complaints or symptoms of Anxiety, Claustrophobia. Objective Constitutional Patient is hypertensive.. Pulse regular and within target range for patient.Marland Kitchen Respirations regular, non-labored and within target range.. Temperature is normal and within the target  range for the patient.Marland Kitchen appears in no distress. Vitals Time Taken: 8:13 AM, Height: 79 in, Source: Stated, Weight: 245 lbs, BMI: 27.6, Temperature: 97.9 F, Pulse: 68 bpm, Respiratory Rate: 18 breaths/min, Blood Pressure: 162/62 mmHg. General Notes: manual blood pressure obtained Eyes Conjunctivae clear. No discharge. Respiratory Respiratory effort is easy and symmetric bilaterally. Rate is normal at rest and on room air.. Bilateral breath sounds are clear and equal in all lobes with no wheezes, rales or rhonchi.. Cardiovascular Heart rhythm and rate regular, without murmur or gallop. JVP is not elevated. Dorsalis pedis pulses are faintly palpable bilaterally. I could not feel his posterior tibial. Popliteal pulses are palpable. Edema present in both extremities. This is 3+ bilaterally and limited to below the knees. Gastrointestinal (GI) No masses. Lymphatic None palpable in the popliteal or inguinal area. Neurological Reduced vibration sense in both feet microfilament test also reduced. Psychiatric No evidence of depression, anxiety, or agitation. Calm, cooperative, and communicative. Appropriate interactions and affect.. General Notes: Wound exam; the patient has a small dime-sized wound on the right medial lower extremity. Base of the wound looks healthy and there is already some epithelialization. There is several scarred areas on the bilateral anterior legs looks like from previous wounds although the history he gave was somewhat vague. He has 2-3+ pitting edema bilaterally below the knees. No signs of a DVT or cellulitis. The edema is pitting. Do not see an obvious systemic cause for fluid overload i.e. no signs of volume overload Integumentary (Hair, Skin) MORONI, NESTER. ([id number]) No rash. There are changes of chronic venous insufficiency right greater than the left hemosiderin deposition. Wound #1 status is Open. Original cause of wound was Gradually Appeared. The wound  is located on the Right,Medial Lower Leg. The wound measures 1.5cm length x 1.5cm width x 0.1cm depth; 1.767cm^2 area and 0.177cm^3 volume. There is Fat Layer (Subcutaneous Tissue) Exposed exposed. There is no tunneling or undermining noted. There is a medium amount of serous drainage noted. The wound margin is flat and intact. There is small (1-33%) red, pink granulation within the wound bed. There is a large (67-100%) amount of necrotic tissue within the wound bed including Adherent Slough. The periwound skin appearance exhibited: Induration, Dry/Scaly. The periwound skin appearance did not exhibit: Callus, Crepitus, Excoriation, Rash, Scarring, Maceration, Atrophie Blanche, Cyanosis, Ecchymosis, Hemosiderin Staining, Mottled, Pallor, Rubor, Erythema. The periwound has tenderness on palpation. Assessment Active Problems ICD-10 L97.211 - Non-pressure chronic ulcer of right calf limited to breakdown of skin E11.622 - Type 2 diabetes mellitus with other skin ulcer I87.311 - Chronic venous hypertension (idiopathic) with ulcer of right lower extremity E11.42 - Type 2 diabetes mellitus with diabetic polyneuropathy Plan Wound Cleansing: Wound #1 Right,Medial Lower Leg: Clean wound with Normal Saline. Anesthetic: Wound #1 Right,Medial Lower Leg: Topical Lidocaine 4% cream applied to wound bed prior to debridement Primary Wound Dressing: Wound #1 Right,Medial Lower Leg: Other: - silvercel Secondary Dressing: Wound #1 Right,Medial Lower Leg: ABD pad Dressing Change Frequency: Wound #1 Right,Medial Lower Leg: Change dressing every week Follow-up Appointments: Wound #1 Right,Medial Lower Leg: Return  Appointment in 1 week. Edema Control: 3 Layer Compression System - Right Lower Extremity Services and Therapies ordered were: Arterial Studies- Bilateral - [facility] General Notes: Referral made to [facility] for arterial studies of lower extremities bilateral. ORVIE, CARADINE  ([id number]) #1 we put calcium alginate on the wound gauze and put him in 3 layer compression #2 I think this should be healed by next week #3 patient tells me is 20-30 mm stockings. We may need more compression that noticed #4 I am not completely certain about the pathogenesis of the edema whether this is all related to venous insufficiency. He had stage III chronic renal failure several years ago and he is on diuretics although we don't know the dose #5 he is going to need compression on the right leg, given the noncompressible ABI I'll only use 3 layer. Cautioned him to remove these if he had significant discomfort #6 arterial studies ordered, he might actually benefit from venous reflux studies as well Electronic Signature(s) Signed: 12/31/2016 11:22:08 AM By: Gretta Cool, BSN, RN, CWS, Kim RN, BSN Signed: 01/02/2017 7:29:46 AM By: Linton Ham MD Previous Signature: 12/07/2016 12:53:02 PM Version By: Gretta Cool BSN, RN, CWS, Kim RN, BSN Previous Signature: 12/07/2016 4:33:39 PM Version By: Linton Ham MD Entered By: Gretta Cool, BSN, RN, CWS, Kim on 12/31/2016 11:22:08 HARWOOD, NALL ([id number]) -------------------------------------------------------------------------------- ROS/PFSH Details Patient Name: MUNACHIMSO, PALIN. Date of Service: 12/07/2016 8:00 AM Medical Record Number: [id number] Patient Account Number: [id number] Date of Birth/Sex: 08-Aug-1940 (76 y.o. Male) Treating RN: Roger Shelter Primary Care Provider: Leanna Battles Other Clinician: Referring Provider: Leanna Battles Treating Provider/Extender: Ricard Dillon Weeks in Treatment: 0 Information Obtained From Patient Wound History Do you currently have one or more open woundso Yes How many open wounds do you currently haveo 1 Approximately how long have you had your woundso 1 week How have you been treating your wound(s) until nowo 1 week Has your wound(s) ever healed and then re-openedo No Have you had any  lab work done in the past montho No Have you tested positive for an antibiotic resistant organism (MRSA, VRE)o No Have you tested positive for osteomyelitis (bone infection)o No Have you had any tests for circulation on your legso Yes Who ordered the testo Dr. Scot Dock several years ago Where was the test doneo [place] Constitutional Symptoms ([place]) Complaints and Symptoms: Negative for: Fatigue; Fever; Chills; Marked Weight Change Eyes Complaints and Symptoms: Positive for: Glasses / Contacts - glasses Negative for: Dry Eyes Medical History: Positive for: Glaucoma Negative for: Cataracts; Optic Neuritis Ear/Nose/Mouth/Throat Complaints and Symptoms: Negative for: Difficult clearing ears; Sinusitis Medical History: Negative for: Chronic sinus problems/congestion; Middle ear problems Hematologic/Lymphatic Complaints and Symptoms: Negative for: Bleeding / Clotting Disorders; Human Immunodeficiency Virus Medical History: Positive for: Lymphedema Negative for: Anemia; Hemophilia; Human Immunodeficiency Virus; Sickle Cell Disease Respiratory BREYLON, SHERROW. ([id number]) Complaints and Symptoms: Negative for: Chronic or frequent coughs; Shortness of Breath Medical History: Negative for: Aspiration; Asthma; Chronic Obstructive Pulmonary Disease (COPD); Pneumothorax; Sleep Apnea; Tuberculosis Cardiovascular Complaints and Symptoms: Positive for: LE edema Negative for: Chest pain Medical History: Positive for: Hypertension Negative for: Angina; Arrhythmia; Congestive Heart Failure; Coronary Artery Disease; Deep Vein Thrombosis; Myocardial Infarction; Peripheral Arterial Disease; Peripheral Venous Disease; Phlebitis; Vasculitis Gastrointestinal Complaints and Symptoms: Negative for: Frequent diarrhea; Nausea; Vomiting Medical History: Negative for: Cirrhosis ; Colitis; Crohnos; Hepatitis A; Hepatitis B; Hepatitis C Endocrine Complaints and Symptoms: Negative for:  Hepatitis; Thyroid disease; Polydypsia (Excessive Thirst) Medical History: Positive for: Type II  Diabetes - insulin dependant Time with diabetes: over 20 years Treated with: Insulin Blood sugar tested every day: Yes Tested : 4 to 5 times daily Blood sugar testing results: Breakfast: 120; Bedtime: 140 Genitourinary Complaints and Symptoms: Negative for: Kidney failure/ Dialysis; Incontinence/dribbling Medical History: Negative for: End Stage Renal Disease Immunological Complaints and Symptoms: Negative for: Hives; Itching Medical History: Negative for: Lupus Erythematosus; Raynaudos; Scleroderma Integumentary (Skin) Complaints and Symptoms: Positive for: Wounds; Swelling JAVELL, BLACKBURN ([id number]) Negative for: Bleeding or bruising tendency; Breakdown Medical History: Negative for: History of Burn; History of pressure wounds Musculoskeletal Complaints and Symptoms: Negative for: Muscle Pain; Muscle Weakness Medical History: Positive for: Gout Negative for: Rheumatoid Arthritis; Osteoarthritis; Osteomyelitis Neurologic Complaints and Symptoms: Negative for: Numbness/parasthesias; Focal/Weakness Medical History: Negative for: Dementia; Neuropathy; Quadriplegia; Paraplegia; Seizure Disorder Psychiatric Complaints and Symptoms: Negative for: Anxiety; Claustrophobia Medical History: Negative for: Anorexia/bulimia; Confinement Anxiety Oncologic Complaints and Symptoms: No Complaints or Symptoms Medical History: Negative for: Received Chemotherapy; Received Radiation HBO Extended History Items Eyes: Glaucoma Immunizations Pneumococcal Vaccine: Received Pneumococcal Vaccination: Yes Implantable Devices Family and Social History Cancer: No; Diabetes: Yes - Mother,Father; Heart Disease: No; Hypertension: Yes - Mother; Kidney Disease: No; Lung Disease: No; Seizures: No; Stroke: No; Thyroid Problems: No; Tuberculosis: No; Former smoker - quit 24 years ago;  Marital Status - Married; Alcohol Use: Never; Drug Use: No History; Caffeine Use: Daily; Financial Concerns: No; Food, Clothing or Shelter Needs: No; Support System Lacking: No; Transportation Concerns: No; Advanced Directives: Yes; Living Will: Yes; [place]: Yes Electronic Signature(s) Signed: 12/07/2016 4:33:39 PM By: Linton Ham MD [name] ([id number]) Signed: 12/07/2016 4:40:58 PM By: Roger Shelter Entered By: Roger Shelter on 12/07/2016 08:38:33 DASHEL, GOINES ([id number]) -------------------------------------------------------------------------------- [place] Details Patient Name: KARTER, HELLMER. Date of Service: 12/07/2016 Medical Record Number: [id number] Patient Account Number: [id number] Date of Birth/Sex: 08/31/1940 (76 y.o. Male) Treating RN: Roger Shelter Primary Care Provider: Leanna Battles Other Clinician: Referring Provider: Leanna Battles Treating Provider/Extender: Ricard Dillon Weeks in Treatment: 0 Diagnosis Coding ICD-10 Codes Code Description U72.536 Non-pressure chronic ulcer of right calf limited to breakdown of skin E11.622 Type 2 diabetes mellitus with other skin ulcer I87.311 Chronic venous hypertension (idiopathic) with ulcer of right lower extremity E11.42 Type 2 diabetes mellitus with diabetic polyneuropathy Facility Procedures CPT4 Code: 64403474 Description: 99213 - WOUND CARE VISIT-LEV 3 EST PT Modifier: Quantity: 1 Physician Procedures CPT4 Code: 2595638 Description: 75643 - WC PHYS LEVEL 4 - NEW PT ICD-10 Diagnosis Description 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

## 2016-12-08 NOTE — Progress Notes (Signed)
Thomas, Mcguire ([id number]) Visit Report for 12/07/2016 Abuse/Suicide Risk Screen Details Patient Name: Thomas Mcguire, Thomas Mcguire. Date of Service: 12/07/2016 8:00 AM Medical Record Number: [id number] Patient Account Number: [id number] Date of Birth/Sex: 1940-09-12 (76 y.o. Male) Treating RN: Roger Shelter Primary Care Nesiah Jump: Leanna Battles Other Clinician: Referring Kynadi Dragos: Leanna Battles Treating Clydia Nieves/Extender: Ricard Dillon Weeks in Treatment: 0 Abuse/Suicide Risk Screen Items Answer ABUSE/SUICIDE RISK SCREEN: Has anyone close to you tried to hurt or harm you recentlyo No Do you feel uncomfortable with anyone in your familyo No Has anyone forced you do things that you didnot want to doo No Do you have any thoughts of harming yourselfo No Patient displays signs or symptoms of abuse and/or neglect. No Electronic Signature(s) Signed: 12/07/2016 4:40:58 PM By: Roger Shelter Entered By: Roger Shelter on 12/07/2016 08:38:45 Thomas Mcguire ([id number]) -------------------------------------------------------------------------------- Activities of Daily Living Details Patient Name: Thomas, Mcguire. Date of Service: 12/07/2016 8:00 AM Medical Record Number: [id number] Patient Account Number: [id number] Date of Birth/Sex: February 10, 1940 (75 y.o. Male) Treating RN: Roger Shelter Primary Care Delbert Vu: Leanna Battles Other Clinician: Referring Andreina Outten: Leanna Battles Treating Niamh Rada/Extender: Ricard Dillon Weeks in Treatment: 0 Activities of Daily Living Items Answer Activities of Daily Living (Please select one for each item) Drive Automobile Completely Able Take Medications Completely Able Use Telephone Completely Able Care for Appearance Completely Able Use Toilet Completely Able Bath / Shower Completely Able Dress Self Completely Able Feed Self Completely Able Walk Completely Able Get In / Out Bed Completely Able Housework Completely  Able Prepare Meals Completely Able Handle Money Completely Able Shop for Self Completely Able Electronic Signature(s) Signed: 12/07/2016 4:40:58 PM By: Roger Shelter Entered By: Roger Shelter on 12/07/2016 08:39:14 Thomas Mcguire ([id number]) -------------------------------------------------------------------------------- Education Assessment Details Patient Name: Thomas, Mcguire. Date of Service: 12/07/2016 8:00 AM Medical Record Number: [id number] Patient Account Number: [id number] Date of Birth/Sex: 1940-01-13 (76 y.o. Male) Treating RN: Roger Shelter Primary Care Serafino Burciaga: Leanna Battles Other Clinician: Referring Kinsley Holderman: Leanna Battles Treating Jabez Molner/Extender: Tito Dine in Treatment: 0 Primary Learner Assessed: Patient Learning Preferences/Education Level/Primary Language Learning Preference: Explanation Highest Education Level: College or Above Preferred Language: English Cognitive Barrier Assessment/Beliefs Language Barrier: No Translator Needed: No Memory Deficit: No Emotional Barrier: No Cultural/Religious Beliefs Affecting Medical Care: No Physical Barrier Assessment Impaired Vision: Yes Glasses Impaired Hearing: No Decreased Hand dexterity: No Knowledge/Comprehension Assessment Knowledge Level: High Comprehension Level: High Ability to understand written High instructions: Ability to understand verbal High instructions: Motivation Assessment Anxiety Level: Calm Cooperation: Cooperative Education Importance: Acknowledges Need Interest in Health Problems: Asks Questions Perception: Coherent Willingness to Engage in Self- High Management Activities: Readiness to Engage in Self- High Management Activities: Electronic Signature(s) Signed: 12/07/2016 4:40:58 PM By: Roger Shelter Entered By: Roger Shelter on 12/07/2016 08:39:54 DERELLE, COCKRELL  ([id number]) -------------------------------------------------------------------------------- Fall Risk Assessment Details Patient Name: Thomas Mcguire. Date of Service: 12/07/2016 8:00 AM Medical Record Number: [id number] Patient Account Number: [id number] Date of Birth/Sex: 20-Dec-1940 (76 y.o. Male) Treating RN: Roger Shelter Primary Care Afsa Meany: Leanna Battles Other Clinician: Referring Kalob Bergen: Leanna Battles Treating Kimila Papaleo/Extender: Ricard Dillon Weeks in Treatment: 0 Fall Risk Assessment Items Have you had 2 or more falls in the last 12 monthso 0 No Have you had any fall that resulted in injury in the last 12 monthso 0 No FALL RISK ASSESSMENT: History of falling - immediate or within 3 months 0 No Secondary diagnosis 0 No Ambulatory aid None/bed rest/wheelchair/nurse 0 No Crutches/cane/walker 0 No Furniture  0 No IV Access/Saline Lock 0 No Gait/Training Normal/bed rest/immobile 0 No Weak 0 No Impaired 0 No Mental Status Oriented to own ability 0 No Electronic Signature(s) Signed: 12/07/2016 4:40:58 PM By: Roger Shelter Entered By: Roger Shelter on 12/07/2016 08:40:18 Thomas Mcguire ([id number]) -------------------------------------------------------------------------------- Foot Assessment Details Patient Name: [name], [name]. Date of Service: 12/07/2016 8:00 AM Medical Record Number: [id number] Patient Account Number: [id number] Date of Birth/Sex: [birth year] (76 y.o. Male) Treating RN: Montey Hora Primary Care Gerard Bonus: Leanna Battles Other Clinician: Referring Laneshia Pina: Leanna Battles Treating Lulia Schriner/Extender: Ricard Dillon Weeks in Treatment: 0 Foot Assessment Items Site Locations + = Sensation present, - = Sensation absent, C = Callus, U = Ulcer R = Redness, W = Warmth, M = Maceration, PU = Pre-ulcerative lesion F = Fissure, S = Swelling, D = Dryness Assessment Right: Left: Other Deformity: No No Prior Foot Ulcer:  No No Prior Amputation: No No Charcot Joint: No No Ambulatory Status: Ambulatory Without Help Gait: Steady Electronic Signature(s) Signed: 12/07/2016 8:40:54 AM By: Montey Hora Entered By: Montey Hora on 12/07/2016 08:40:54 Thomas Mcguire ([id number]) -------------------------------------------------------------------------------- Nutrition Risk Assessment Details Patient Name: KENJI, MAPEL. Date of Service: 12/07/2016 8:00 AM Medical Record Number: [id number] Patient Account Number: [id number] Date of Birth/Sex: August 13, 1940 (76 y.o. Male) Treating RN: Roger Shelter Primary Care Edwin Baines: Leanna Battles Other Clinician: Referring Tavaris Eudy: Leanna Battles Treating Dru Laurel/Extender: Ricard Dillon Weeks in Treatment: 0 Height (in): 79 Weight (lbs): 245 Body Mass Index (BMI): 27.6 Nutrition Risk Assessment Items NUTRITION RISK SCREEN: I have an illness or condition that made me change the kind and/or amount of 0 No food I eat I eat fewer than two meals per day 0 No I eat few fruits and vegetables, or milk products 0 No I have three or more drinks of beer, liquor or wine almost every day 0 No I have tooth or mouth problems that make it hard for me to eat 0 No I don't always have enough money to buy the food I need 0 No I eat alone most of the time 0 No I take three or more different prescribed or over-the-counter drugs a day 0 No Without wanting to, I have lost or gained 10 pounds in the last six months 0 No I am not always physically able to shop, cook and/or feed myself 0 No Nutrition Protocols Good Risk Protocol 0 No interventions needed Moderate Risk Protocol Electronic Signature(s) Signed: 12/07/2016 4:40:58 PM By: Roger Shelter Entered By: Roger Shelter on 12/07/2016 08:40:35

## 2016-12-08 NOTE — Progress Notes (Signed)
DECKARD, STUBER ([id number]) Visit Report for 12/07/2016 Allergy List Details Patient Name: Thomas Mcguire, Thomas Mcguire. Date of Service: 12/07/2016 8:00 AM Medical Record Number: [id number] Patient Account Number: [id number] Date of Birth/Sex: 03-Dec-1940 (76 y.o. Male) Treating RN: Roger Shelter Primary Care Delberta Folts: Leanna Battles Other Clinician: Referring Elber Galyean: Leanna Battles Treating Jayni Prescher/Extender: Ricard Dillon Weeks in Treatment: 0 Allergies Active Allergies No Known Drug Allergies Allergy Notes Electronic Signature(s) Signed: 12/07/2016 4:40:58 PM By: Roger Shelter Entered By: Roger Shelter on 12/07/2016 08:27:33 Thomas Mcguire ([id number]) -------------------------------------------------------------------------------- [place] Details Patient Name: Thomas Mcguire, Thomas Mcguire. Date of Service: 12/07/2016 8:00 AM Medical Record Number: [id number] Patient Account Number: [id number] Date of Birth/Sex: 1940-10-16 (76 y.o. Male) Treating RN: Roger Shelter Primary Care Tilman Mcclaren: Leanna Battles Other Clinician: Referring Marny Smethers: Leanna Battles Treating Elwin Tsou/Extender: Tito Dine in Treatment: 0 Visit Information Patient Arrived: Ambulatory Arrival Time: 08:12 Accompanied By: self Transfer Assistance: None Patient Identification Verified: Yes Secondary Verification Process Yes Completed: Patient Requires Transmission-Based No Precautions: Patient Has Alerts: Yes Patient Alerts: ABI [place] BILATERAL Type II diabetes Electronic Signature(s) Signed: 12/07/2016 12:45:19 PM By: Roger Shelter Previous Signature: 12/07/2016 8:45:48 AM Version By: Montey Hora Entered By: Roger Shelter on 12/07/2016 12:45:19 Thomas Mcguire ([id number]) -------------------------------------------------------------------------------- Clinic Level of Care Assessment Details Patient Name: Thomas Mcguire. Date of Service: 12/07/2016  8:00 AM Medical Record Number: [id number] Patient Account Number: [id number] Date of Birth/Sex: 16-Nov-1940 (76 y.o. Male) Treating RN: Roger Shelter Primary Care Donisha Hoch: Leanna Battles Other Clinician: Referring Azzan Butler: Leanna Battles Treating Jassiah Viviano/Extender: Ricard Dillon Weeks in Treatment: 0 Clinic Level of Care Assessment Items TOOL 2 Quantity Score X - Use when only an EandM is performed on the INITIAL visit 1 0 ASSESSMENTS - Nursing Assessment / Reassessment X - General Physical Exam (combine w/ comprehensive assessment (listed just below) when 1 20 performed on new pt. evals) X- 1 25 Comprehensive Assessment (HX, ROS, Risk Assessments, Wounds Hx, etc.) ASSESSMENTS - Wound and Skin Assessment / Reassessment X - Simple Wound Assessment / Reassessment - one wound 1 5 []  - 0 Complex Wound Assessment / Reassessment - multiple wounds []  - 0 Dermatologic / Skin Assessment (not related to wound area) ASSESSMENTS - Ostomy and/or Continence Assessment and Care []  - Incontinence Assessment and Management 0 []  - 0 Ostomy Care Assessment and Management (repouching, etc.) PROCESS - Coordination of Care X - Simple Patient / Family Education for ongoing care 1 15 []  - 0 Complex (extensive) Patient / Family Education for ongoing care []  - 0 Staff obtains Programmer, systems, Records, Test Results / Process Orders []  - 0 Staff telephones HHA, Nursing Homes / Clarify orders / etc []  - 0 Routine Transfer to another Facility (non-emergent condition) []  - 0 Routine Hospital Admission (non-emergent condition) []  - 0 New Admissions / Biomedical engineer / Ordering NPWT, Apligraf, etc. []  - 0 Emergency Hospital Admission (emergent condition) X- 1 10 Simple Discharge Coordination []  - 0 Complex (extensive) Discharge Coordination PROCESS - Special Needs []  - Pediatric / Minor Patient Management 0 []  - 0 Isolation Patient Management Thomas Mcguire, Thomas Mcguire ([id number]) []  -  0 Hearing / Language / Visual special needs []  - 0 Assessment of Community assistance (transportation, D/C planning, etc.) []  - 0 Additional assistance / Altered mentation []  - 0 Support Surface(s) Assessment (bed, cushion, seat, etc.) INTERVENTIONS - Wound Cleansing / Measurement X - Wound Imaging (photographs - any number of wounds) 1 5 []  - 0 Wound Tracing (instead of photographs) X- 1 5 Simple  Wound Measurement - one wound []  - 0 Complex Wound Measurement - multiple wounds X- 1 5 Simple Wound Cleansing - one wound []  - 0 Complex Wound Cleansing - multiple wounds INTERVENTIONS - Wound Dressings X - Small Wound Dressing one or multiple wounds 1 10 []  - 0 Medium Wound Dressing one or multiple wounds []  - 0 Large Wound Dressing one or multiple wounds []  - 0 Application of Medications - injection INTERVENTIONS - Miscellaneous []  - External ear exam 0 []  - 0 Specimen Collection (cultures, biopsies, blood, body fluids, etc.) []  - 0 Specimen(s) / Culture(s) sent or taken to Lab for analysis []  - 0 Patient Transfer (multiple staff / Civil Service fast streamer / Similar devices) []  - 0 Simple Staple / Suture removal (25 or less) []  - 0 Complex Staple / Suture removal (26 or more) []  - 0 Hypo / Hyperglycemic Management (close monitor of Blood Glucose) []  - 0 Ankle / Brachial Index (ABI) - do not check if billed separately Has the patient been seen at the hospital within the last three years: Yes Total Score: 100 Level Of Care: New/Established - Level 3 Electronic Signature(s) Signed: 12/07/2016 4:40:58 PM By: Roger Shelter Entered By: Roger Shelter on 12/07/2016 12:34:47 Thomas Mcguire ([id number]) -------------------------------------------------------------------------------- Encounter Discharge Information Details Patient Name: Thomas Mcguire, Thomas Mcguire. Date of Service: 12/07/2016 8:00 AM Medical Record Number: [id number] Patient Account Number: [id number] Date of Birth/Sex:  02/08/1940 (76 y.o. Male) Treating RN: Roger Shelter Primary Care Kaynen Minner: Leanna Battles Other Clinician: Referring Jaydon Soroka: Leanna Battles Treating Dona Klemann/Extender: Tito Dine in Treatment: 0 Encounter Discharge Information Items Discharge Pain Level: 0 Discharge Condition: Stable Ambulatory Status: Ambulatory Discharge Destination: Home Private Transportation: Auto Accompanied By: self Schedule Follow-up Appointment: Yes Medication Reconciliation completed and provided No to Patient/Care Mayson Mcneish: Clinical Summary of Care: Electronic Signature(s) Signed: 12/07/2016 4:40:58 PM By: Roger Shelter Entered By: Roger Shelter on 12/07/2016 09:16:02 Thomas Mcguire ([id number]) -------------------------------------------------------------------------------- Lower Extremity Assessment Details Patient Name: Thomas Mcguire, Thomas Mcguire. Date of Service: 12/07/2016 8:00 AM Medical Record Number: [id number] Patient Account Number: [id number] Date of Birth/Sex: 1940-11-23 (76 y.o. Male) Treating RN: Montey Hora Primary Care Ruwayda Curet: Leanna Battles Other Clinician: Referring Cymone Yeske: Leanna Battles Treating Saadiya Wilfong/Extender: Ricard Dillon Weeks in Treatment: 0 Edema Assessment Assessed: [Left: No] [Right: No] Edema: [Left: Yes] [Right: Yes] Calf Left: Right: Point of Measurement: 38 cm From Medial Instep 44.2 cm 44.4 cm Ankle Left: Right: Point of Measurement: 13 cm From Medial Instep 31.4 cm 30.8 cm Vascular Assessment Pulses: Dorsalis Pedis Palpable: [Left:Yes] [Right:Yes] Doppler Audible: [Left:Yes] [Right:Yes] Posterior Tibial Palpable: [Left:No] [Right:No] Doppler Audible: [Left:Yes] [Right:Yes] Extremity colors, hair growth, and conditions: Extremity Color: [Left:Normal] [Right:Normal] Hair Growth on Extremity: [Left:No] [Right:No] Temperature of Extremity: [Left:Warm] [Right:Warm] Capillary Refill: [Left:< 3 seconds] [Right:< 3  seconds] Toe Nail Assessment Left: Right: Thick: Yes Yes Discolored: No No Deformed: No No Improper Length and Hygiene: No No Notes ABI [place] BILATERAL - SOUND DID NOT RETURN Electronic Signature(s) Signed: 12/07/2016 8:45:10 AM By: Montey Hora Entered By: Montey Hora on 12/07/2016 08:45:10 Thomas Mcguire ([id number]) -------------------------------------------------------------------------------- Multi Wound Chart Details Patient Name: Thomas Mcguire. Date of Service: 12/07/2016 8:00 AM Medical Record Number: [id number] Patient Account Number: [id number] Date of Birth/Sex: Aug 22, 1940 (76 y.o. Male) Treating RN: Roger Shelter Primary Care Rakesh Dutko: Leanna Battles Other Clinician: Referring Elton Heid: Leanna Battles Treating Lenah Messenger/Extender: Ricard Dillon Weeks in Treatment: 0 Vital Signs Height(in): 79 Pulse(bpm): 68 Weight(lbs): 245 Blood Pressure(mmHg): 162/62 Body Mass Index(BMI): 28 Temperature(F): 97.9 Respiratory  Rate 18 (breaths/min): Photos: [1:No Photos] [N/A:N/A] Wound Location: [1:Right Lower Leg - Medial] [N/A:N/A] Wounding Event: [1:Gradually Appeared] [N/A:N/A] Primary Etiology: [1:Lymphedema] [N/A:N/A] Comorbid History: [1:Glaucoma, Lymphedema, Hypertension, Type II Diabetes, Gout] [N/A:N/A] Date Acquired: [1:11/28/2016] [N/A:N/A] Weeks of Treatment: [1:0] [N/A:N/A] Wound Status: [1:Open] [N/A:N/A] Measurements L x W x D [1:1.5x1.5x0.1] [N/A:N/A] (cm) Area (cm) : [1:1.767] [N/A:N/A] Volume (cm) : [1:0.177] [N/A:N/A] % Reduction in Area: [1:0.00%] [N/A:N/A] % Reduction in Volume: [1:0.00%] [N/A:N/A] Classification: [1:Partial Thickness] [N/A:N/A] Exudate Amount: [1:Medium] [N/A:N/A] Exudate Type: [1:Serous] [N/A:N/A] Exudate Color: [1:amber] [N/A:N/A] Wound Margin: [1:Flat and Intact] [N/A:N/A] Granulation Amount: [1:Small (1-33%)] [N/A:N/A] Granulation Quality: [1:Red, Pink] [N/A:N/A] Necrotic Amount: [1:Large (67-100%)]  [N/A:N/A] Exposed Structures: [1:Fat Layer (Subcutaneous Tissue) Exposed: Yes Fascia: No Tendon: No Muscle: No Joint: No Bone: No] [N/A:N/A] Epithelialization: [1:Small (1-33%)] [N/A:N/A] Periwound Skin Texture: [1:Induration: Yes Excoriation: No Callus: No Crepitus: No] [N/A:N/A] Rash: No Scarring: No Periwound Skin Moisture: Dry/Scaly: Yes N/A N/A Maceration: No Periwound Skin Color: Atrophie Blanche: No N/A N/A Cyanosis: No Ecchymosis: No Erythema: No Hemosiderin Staining: No Mottled: No Pallor: No Rubor: No Tenderness on Palpation: Yes N/A N/A Wound Preparation: Ulcer Cleansing: N/A N/A Rinsed/Irrigated with Saline Topical Anesthetic Applied: Other: lidocaine 4% Treatment Notes Wound #1 (Right, Medial Lower Leg) 1. Cleansed with: Clean wound with Normal Saline 2. Anesthetic Topical Lidocaine 4% cream to wound bed prior to debridement 4. Dressing Applied: Other dressing (specify in notes) 5. Secondary Dressing Applied ABD Pad 7. Secured with 3 Layer Compression System - Right Lower Extremity Notes silvercel Electronic Signature(s) Signed: 12/07/2016 9:45:18 AM By: Roger Shelter Entered By: Roger Shelter on 12/07/2016 09:45:18 Thomas Mcguire ([id number]) -------------------------------------------------------------------------------- [place] Details Patient Name: Thomas Mcguire, Thomas Mcguire. Date of Service: 12/07/2016 8:00 AM Medical Record Number: [id number] Patient Account Number: [id number] Date of Birth/Sex: 06-23-1940 (76 y.o. Male) Treating RN: Roger Shelter Primary Care Antron Seth: Leanna Battles Other Clinician: Referring Artemisia Auvil: Leanna Battles Treating Lyn Deemer/Extender: Ricard Dillon Weeks in Treatment: 0 Active Inactive ` Peripheral Neuropathy Nursing Diagnoses: Knowledge deficit related to disease process and management of peripheral neurovascular dysfunction Goals: Patient/caregiver will verbalize  understanding of disease process and disease management Date Initiated: 12/07/2016 Target Resolution Date: 12/28/2016 Goal Status: Active Interventions: Assess signs and symptoms of neuropathy upon admission and as needed Provide education on Management of Neuropathy and Related Ulcers Notes: ` Wound/Skin Impairment Nursing Diagnoses: Impaired tissue integrity Knowledge deficit related to ulceration/compromised skin integrity Goals: Patient/caregiver will verbalize understanding of skin care regimen Date Initiated: 12/07/2016 Target Resolution Date: 12/28/2016 Goal Status: Active Ulcer/skin breakdown will have a volume reduction of 30% by week 4 Date Initiated: 12/07/2016 Target Resolution Date: 12/28/2016 Goal Status: Active Interventions: Assess patient/caregiver ability to obtain necessary supplies Assess patient/caregiver ability to perform ulcer/skin care regimen upon admission and as needed Assess ulceration(s) every visit Treatment Activities: Skin care regimen initiated : 12/07/2016 Topical wound management initiated : 12/07/2016 Notes: AKI, ABALOS ([id number]) Electronic Signature(s) Signed: 12/07/2016 4:40:58 PM By: Roger Shelter Entered By: Roger Shelter on 12/07/2016 08:44:40 Thomas Mcguire ([id number]) -------------------------------------------------------------------------------- Pain Assessment Details Patient Name: Thomas Mcguire, Thomas Mcguire. Date of Service: 12/07/2016 8:00 AM Medical Record Number: [id number] Patient Account Number: [id number] Date of Birth/Sex: 1940/01/13 (76 y.o. Male) Treating RN: Roger Shelter Primary Care Candace Ramus: Leanna Battles Other Clinician: Referring Josilyn Shippee: Leanna Battles Treating Vonnie Spagnolo/Extender: Ricard Dillon Weeks in Treatment: 0 Active Problems Location of Pain Severity and Description of Pain Patient Has Paino No Site Locations Pain Management and Medication Current Pain  Management: Electronic  Signature(s) Signed: 12/07/2016 4:40:58 PM By: Roger Shelter Entered By: Roger Shelter on 12/07/2016 08:13:13 Thomas Mcguire ([id number]) -------------------------------------------------------------------------------- Patient/Caregiver Education Details Patient Name: Thomas Mcguire, Thomas Mcguire. Date of Service: 12/07/2016 8:00 AM Medical Record Number: [id number] Patient Account Number: [id number] Date of Birth/Gender: 1940/02/28 (76 y.o. Male) Treating RN: Roger Shelter Primary Care Physician: Leanna Battles Other Clinician: Referring Physician: Leanna Battles Treating Physician/Extender: Tito Dine in Treatment: 0 Education Assessment Education Provided To: Patient Education Topics Provided Welcome To [facility]: Handouts: Welcome To [facility] Methods: Explain/Verbal Responses: State content correctly Wound/Skin Impairment: Handouts: Caring for Your Ulcer Methods: Explain/Verbal Responses: State content correctly Electronic Signature(s) Signed: 12/07/2016 4:40:58 PM By: Roger Shelter Entered By: Roger Shelter on 12/07/2016 09:16:43 Thomas Mcguire ([id number]) -------------------------------------------------------------------------------- Wound Assessment Details Patient Name: Thomas Mcguire, Thomas Mcguire. Date of Service: 12/07/2016 8:00 AM Medical Record Number: [id number] Patient Account Number: [id number] Date of Birth/Sex: 1940/07/19 (76 y.o. Male) Treating RN: Roger Shelter Primary Care Yailene Badia: Leanna Battles Other Clinician: Referring Damari Hiltz: Leanna Battles Treating Markie Frith/Extender: Ricard Dillon Weeks in Treatment: 0 Wound Status Wound Number: 1 Primary Lymphedema Etiology: Wound Location: Right Lower Leg - Medial Wound Status: Open Wounding Event: Gradually Appeared Comorbid Glaucoma, Lymphedema, Hypertension, Type Date Acquired: 11/28/2016 History: II Diabetes, Gout Weeks  Of Treatment: 0 Clustered Wound: No Photos Wound Measurements Length: (cm) 1.5 Width: (cm) 1.5 Depth: (cm) 0.1 Area: (cm) 1.767 Volume: (cm) 0.177 % Reduction in Area: 0% % Reduction in Volume: 0% Epithelialization: Small (1-33%) Tunneling: No Undermining: No Wound Description Classification: Partial Thickness Foul Odor Wound Margin: Flat and Intact Slough/Fi Exudate Amount: Medium Exudate Type: Serous Exudate Color: amber After Cleansing: No brino No Wound Bed Granulation Amount: Small (1-33%) Exposed Structure Granulation Quality: Red, Pink Fascia Exposed: No Necrotic Amount: Large (67-100%) Fat Layer (Subcutaneous Tissue) Exposed: Yes Necrotic Quality: Adherent Slough Tendon Exposed: No Muscle Exposed: No Joint Exposed: No Bone Exposed: No Periwound Skin Texture Texture Color No Abnormalities Noted: No No Abnormalities Noted: No Callus: No Atrophie Blanche: No Crepitus: No Cyanosis: No CARDELL, RACHEL ([id number]) Excoriation: No Ecchymosis: No Induration: Yes Erythema: No Rash: No Hemosiderin Staining: No Scarring: No Mottled: No Pallor: No Moisture Rubor: No No Abnormalities Noted: No Dry / Scaly: Yes Temperature / Pain Maceration: No Tenderness on Palpation: Yes Wound Preparation Ulcer Cleansing: Rinsed/Irrigated with Saline Topical Anesthetic Applied: Other: lidocaine 4%, Electronic Signature(s) Signed: 12/07/2016 4:40:58 PM By: Roger Shelter Previous Signature: 12/07/2016 9:44:13 AM Version By: Roger Shelter Entered By: Roger Shelter on 12/07/2016 10:02:26 Thomas Mcguire ([id number]) -------------------------------------------------------------------------------- [place] Details Patient Name: Thomas Mcguire, Thomas Mcguire. Date of Service: 12/07/2016 8:00 AM Medical Record Number: [id number] Patient Account Number: [id number] Date of Birth/Sex: Oct 03, 1940 (76 y.o. Male) Treating RN: Roger Shelter Primary Care Hayly Litsey:  Leanna Battles Other Clinician: Referring Kian Gamarra: Leanna Battles Treating Calvin Jablonowski/Extender: Ricard Dillon Weeks in Treatment: 0 Vital Signs Time Taken: 08:13 Temperature (F): 97.9 Height (in): 79 Pulse (bpm): 68 Source: Stated Respiratory Rate (breaths/min): 18 Weight (lbs): 245 Blood Pressure (mmHg): 162/62 Body Mass Index (BMI): 27.6 Reference Range: 80 - 120 mg / dl Notes manual blood pressure obtained Electronic Signature(s) Signed: 12/07/2016 9:43:30 AM By: Roger Shelter Entered By: Roger Shelter on 12/07/2016 09:43:30

## 2016-12-13 ENCOUNTER — Other Ambulatory Visit: Payer: Self-pay

## 2016-12-13 DIAGNOSIS — L97519 Non-pressure chronic ulcer of other part of right foot with unspecified severity: Secondary | ICD-10-CM

## 2016-12-14 ENCOUNTER — Encounter: Payer: Medicare Other | Attending: Physician Assistant | Admitting: Physician Assistant

## 2016-12-14 DIAGNOSIS — E11622 Type 2 diabetes mellitus with other skin ulcer: Secondary | ICD-10-CM | POA: Insufficient documentation

## 2016-12-14 DIAGNOSIS — E1142 Type 2 diabetes mellitus with diabetic polyneuropathy: Secondary | ICD-10-CM | POA: Insufficient documentation

## 2016-12-14 DIAGNOSIS — H409 Unspecified glaucoma: Secondary | ICD-10-CM | POA: Diagnosis not present

## 2016-12-14 DIAGNOSIS — Z794 Long term (current) use of insulin: Secondary | ICD-10-CM | POA: Insufficient documentation

## 2016-12-14 DIAGNOSIS — Z87891 Personal history of nicotine dependence: Secondary | ICD-10-CM | POA: Diagnosis not present

## 2016-12-14 DIAGNOSIS — L97211 Non-pressure chronic ulcer of right calf limited to breakdown of skin: Secondary | ICD-10-CM | POA: Diagnosis not present

## 2016-12-14 DIAGNOSIS — I87311 Chronic venous hypertension (idiopathic) with ulcer of right lower extremity: Secondary | ICD-10-CM | POA: Insufficient documentation

## 2016-12-14 DIAGNOSIS — I890 Lymphedema, not elsewhere classified: Secondary | ICD-10-CM | POA: Diagnosis not present

## 2016-12-14 DIAGNOSIS — M109 Gout, unspecified: Secondary | ICD-10-CM | POA: Diagnosis not present

## 2016-12-15 NOTE — Progress Notes (Signed)
Thomas Mcguire ([id number]) Visit Report for 12/14/2016 Chief Complaint Document Details Patient Name: Thomas Mcguire, Thomas Mcguire. Date of Service: 12/14/2016 8:15 AM Medical Record Number: [id number] Patient Account Number: [id number] Date of Birth/Sex: [birth year] (76 y.o. Male) Treating RN: Thomas Mcguire Primary Care Provider: Leanna Mcguire Other Clinician: Referring Provider: Leanna Mcguire Treating Provider/Extender: Thomas Mcguire, Thomas Mcguire in Treatment: 1 Information Obtained from: Patient Chief Complaint 12/07/16; patient is here for review of a wound on the right lower leg Electronic Signature(s) Signed: 12/14/2016 5:06:08 PM By: Worthy Keeler PA-C Entered By: Worthy Mcguire on 12/14/2016 08:14:59 Thomas Mcguire ([id number]) -------------------------------------------------------------------------------- HPI Details Patient Name: Thomas Mcguire, Thomas Mcguire. Date of Service: 12/14/2016 8:15 AM Medical Record Number: [id number] Patient Account Number: [id number] Date of Birth/Sex: [birth year] (76 y.o. 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 He is here for review of wound on the right lower leg. 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 He has chronic edema in his lower legs and uses 20-30 mm compression stockings which she is compliant with. He is a nonsmoker. Beside his diabetes he has hypertension and hypercholesterolemia and in 2013 he had stage III chronic renal failure. He tells me his primary physician Thomas Mcguire at [facility] follows him every 3 months for his kidney failure. He also states he saw a  nephrologist once. ABIs in this clinic were noncompressible. He tells me he saw Dr. Doren Mcguire in 6 or 7 years ago at [facility] in [place]. He does not remember what the issue was. It would appear that the patient has had recurrent blistering in the lower extremities as there are several healed areas on the left anterior and right anterior calves 12/14/16 on evaluation today patient appears to be doing very well with the current three layer compression wraps. His right lower extremity is significantly improved as far as the swelling is concerned. He has been tolerating the wraps without any complication. The wounds also appear to be doing better compared to last week's evaluation. Unfortunately he does have a new dramatic wound to the left interior shin but otherwise things are looking up. No fevers chills noted. Patient does have a appointment with [facility] on December 23, 2016 four arterial studies. Electronic Signature(s) Signed: 12/14/2016 5:06:08 PM By: Worthy Keeler PA-C Entered By: Worthy Mcguire on 12/14/2016 09:16:47 Thomas Mcguire ([id number]) -------------------------------------------------------------------------------- Physical Exam Details Patient Name: Thomas Mcguire, Thomas Mcguire. Date of Service: 12/14/2016 8:15 AM Medical Record Number: [id number] Patient Account Number: [id number] Date of Birth/Sex: 1940-02-04 (76 y.o. Male) Treating RN: Thomas Mcguire Primary Care Provider: Leanna Mcguire Other Clinician: Referring Provider: Leanna Mcguire Treating Provider/Extender: Thomas Mcguire Mcguire in Treatment: 1 Constitutional Obese and well-hydrated in no acute distress. Respiratory normal breathing without difficulty. clear to auscultation bilaterally. Cardiovascular regular rate and rhythm with normal S1, S2. 1+ pitting edema of the bilateral lower extremities. Psychiatric this patient is able to make decisions and demonstrates good insight  into disease process. Alert and Oriented x 3. pleasant and cooperative. Notes Patient's wound over the left anterior shin appears to be doing excellent and there's no evidence of infection at this point. He has great epithelialization  noted. The left anterior shin somatic wound does not appear to be in terrible shape and in fact appears to already show signs of healing. No debridement required at the either site. Electronic Signature(s) Signed: 12/14/2016 5:06:08 PM By: Worthy Keeler PA-C Entered By: Worthy Mcguire on 12/14/2016 09:17:48 Thomas Mcguire ([id number]) -------------------------------------------------------------------------------- Physician Orders Details Patient Name: Thomas Mcguire, Thomas Mcguire. Date of Service: 12/14/2016 8:15 AM Medical Record Number: [id number] Patient Account Number: [id number] Date of Birth/Sex: 1940/01/24 (76 y.o. 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 Wound #2 Left,Medial Lower Leg o Clean wound with Normal Saline. Anesthetic Wound #1 Right,Medial Lower Leg o Topical Lidocaine 4% cream applied to wound bed prior to debridement Wound #2 Left,Medial Lower Leg o Topical Lidocaine 4% cream applied to wound bed prior to debridement Primary Wound Dressing Wound #1 Right,Medial Lower Leg o Other: - silvercel Wound #2 Left,Medial Lower Leg o Other: -  silvercel Dressing Change Frequency Wound #1 Right,Medial Lower Leg o Change dressing every week Wound #2 Left,Medial Lower Leg o Change dressing every week Follow-up Appointments Wound #1 Right,Medial Lower Leg o Return Appointment in 1 week. Wound #2 Left,Medial Lower Leg o Return Appointment in 1 week. Thomas Mcguire, Thomas Mcguire ([id number]) Edema Control o 3 Layer Compression System - Bilateral o Elevate legs to the level of the heart and pump ankles as often as possible Electronic Signature(s) Signed: 12/14/2016 1:56:40 PM By: Thomas Mcguire Signed: 12/14/2016 5:06:08 PM By: Worthy Keeler PA-C Entered By: Thomas Mcguire on 12/14/2016 09:16:56 Thomas Mcguire ([id number]) -------------------------------------------------------------------------------- Problem List Details Patient Name: Thomas Mcguire, Thomas Mcguire. Date of Service: 12/14/2016 8:15 AM Medical Record Number: [id number] Patient Account Number: [id number] Date of Birth/Sex: 1940/05/19 (76 y.o. Male) Treating RN: Thomas Mcguire Primary Care Provider: Leanna Mcguire Other Clinician: Referring Provider: Leanna Mcguire Treating Provider/Extender: Thomas Mcguire, Allysen Lazo Mcguire in Treatment: 1 Active Problems ICD-10 Encounter Code Description Active Date Diagnosis L97.211 Non-pressure chronic ulcer of right calf limited to breakdown of skin 12/07/2016 Yes E11.622 Type 2 diabetes mellitus with other skin ulcer 12/07/2016 Yes I87.311 Chronic venous hypertension (idiopathic) with ulcer of right lower 12/07/2016 Yes extremity E11.42 Type 2 diabetes mellitus with diabetic polyneuropathy 12/07/2016 Yes Inactive Problems Resolved Problems Electronic Signature(s) Signed: 12/14/2016 5:06:08 PM By: Worthy Keeler PA-C Entered By: Worthy Mcguire on 12/14/2016 08:14:51 Thomas Mcguire ([id number]) -------------------------------------------------------------------------------- Progress Note Details Patient Name:  Thomas Mcguire. Date of Service: 12/14/2016 8:15 AM Medical Record Number: [id number] Patient Account Number: [id number] Date of Birth/Sex: 1940/09/20 (76 y.o. 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 He is here for review of wound on the right lower leg. 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 He has chronic edema in his lower legs and uses 20-30 mm compression stockings which she is compliant with. He is a nonsmoker. Beside his diabetes he has hypertension and hypercholesterolemia and in 2013 he had stage III chronic renal failure. He tells me his primary physician Thomas Mcguire at [facility] follows him every 3 months for his kidney failure. He also states he saw a nephrologist once. ABIs in this clinic were noncompressible. He tells me he saw Dr. Doren Mcguire in 6 or 7 years ago at [facility] in [place]. He does not remember what the issue was. It would appear that the patient has had recurrent blistering in the lower extremities as there are several healed areas on the left anterior and right anterior calves 12/14/16 on evaluation today patient appears to be doing very well with the current three layer compression wraps. His right lower extremity is significantly improved as far as the swelling is concerned. He has been tolerating the wraps without any complication. The wounds also appear to be doing better compared to last week's evaluation. Unfortunately he does have a new dramatic wound to  the left interior shin but otherwise things are looking up. No fevers chills noted. Patient does have a appointment with [facility] [place] [facility] on December 23, 2016 four arterial studies. Patient History Information obtained from Patient. Family History Diabetes - Mother,Father, Hypertension - Mother, No family history of Cancer, Heart Disease, Kidney Disease, Lung Disease, Seizures, Stroke, Thyroid Problems, Tuberculosis. Social History Former smoker - quit 24 years ago, Marital Status - Married, Alcohol Use - Never, Drug Use - No History, Caffeine Use - Daily. Review of Systems (ROS) Constitutional Symptoms (General Health) Denies complaints or symptoms of Fever, Chills. Respiratory The patient has no complaints or symptoms. Cardiovascular Complains or has symptoms of LE edema. Psychiatric The patient has no complaints or symptoms. Thomas Mcguire, Thomas Mcguire [place] ([id number]) Objective Constitutional Obese and well-hydrated in no acute distress. Vitals Time Taken: 8:14 AM, Height: 79 in, Weight: 245 lbs, BMI: 27.6, Temperature: 98.1 F, Pulse: 57 bpm, Respiratory Rate: 18 breaths/min, Blood Pressure: 182/71 mmHg. Respiratory normal breathing without difficulty. clear to auscultation bilaterally. Cardiovascular regular rate and rhythm with normal S1, S2. 1+ pitting edema of the bilateral lower extremities. Psychiatric this patient is able to make decisions and demonstrates good insight into disease process. Alert and Oriented x 3. pleasant and cooperative. General Notes: Patient's wound over the left anterior shin appears to be doing excellent and there's no evidence of infection at this point. He has great epithelialization noted. The left anterior shin somatic wound does not appear to be in terrible shape and in fact appears to already show signs of healing. No debridement required at the either site. Integumentary (Hair, Skin) Wound #1 status is Open. Original cause of wound was  Gradually Appeared. The wound is located on the Right,Medial Lower Leg. The wound measures 1.2cm length x 1.2cm width x 0.1cm depth; 1.131cm^2 area and 0.113cm^3 volume. There is Fat Layer (Subcutaneous Tissue) Exposed exposed. There is a medium amount of serous drainage noted. The wound margin is flat and intact. There is small (1-33%) red, pink granulation within the wound bed. There is a large (67-100%) amount of necrotic tissue within the wound bed including Adherent Slough. The periwound skin appearance exhibited: Induration, Dry/Scaly. The periwound skin appearance did not exhibit: Callus, Crepitus, Excoriation, Rash, Scarring, Maceration, Atrophie Blanche, Cyanosis, Ecchymosis, Hemosiderin  Staining, Mottled, Pallor, Rubor, Erythema. The periwound has tenderness on palpation. Wound #2 status is Open. Original cause of wound was Gradually Appeared. The wound is located on the Left,Medial Lower Leg. The wound measures 2.2cm length x 0.6cm width x 0.1cm depth; 1.037cm^2 area and 0.104cm^3 volume. There is no tunneling or undermining noted. There is a none present amount of drainage noted. The wound margin is flat and intact. There is small (1-33%) red granulation within the wound bed. There is a large (67-100%) amount of necrotic tissue within the wound bed including Eschar. The periwound skin appearance did not exhibit: Callus, Crepitus, Excoriation, Induration, Rash, Scarring, Dry/Scaly, Maceration, Atrophie Blanche, Cyanosis, Ecchymosis, Hemosiderin Staining, Mottled, Pallor, Rubor, Erythema. Assessment Active Problems ICD-10 Thomas Mcguire, Thomas Mcguire ([id number]) L97.211 - Non-pressure chronic ulcer of right calf limited to breakdown of skin E11.622 - Type 2 diabetes mellitus with other skin ulcer I87.311 - Chronic venous hypertension (idiopathic) with ulcer of right lower extremity E11.42 - Type 2 diabetes mellitus with diabetic polyneuropathy Plan Wound Cleansing: Wound #1 Right,Medial  Lower Leg: Clean wound with Normal Saline. Wound #2 Left,Medial Lower Leg: Clean wound with Normal Saline. Anesthetic: Wound #1 Right,Medial Lower Leg: Topical Lidocaine 4% cream applied to wound bed prior to debridement Wound #2 Left,Medial Lower Leg: Topical Lidocaine 4% cream applied to wound bed prior to debridement Primary Wound Dressing: Wound #1 Right,Medial Lower Leg: Other: - silvercel Wound #2 Left,Medial Lower Leg: Other: - silvercel Dressing Change Frequency: Wound #1 Right,Medial Lower Leg: Change dressing every week Wound #2 Left,Medial Lower Leg: Change dressing every week Follow-up Appointments: Wound #1 Right,Medial Lower Leg: Return Appointment in 1 week. Wound #2 Left,Medial Lower Leg: Return Appointment in 1 week. Edema Control: 3 Layer Compression System - Bilateral Elevate legs to the level of the heart and pump ankles as often as possible I'm going to recommend that we initiate treatment to the left lower extremity with the silver alginate dressing and the three layer compression wrap as we are doing on the right. The wrap has greatly improved his swelling in this extremity. We will see were things stand in one Mcguire time. Please see above for specific wound care orders. We will see patient for re- evaluation in 1 week(s) here in the clinic. If anything worsens or changes patient will contact our office for additional recommendations. Electronic Signature(s) Signed: 12/14/2016 5:06:08 PM By: Worthy Keeler PA-C Entered By: Worthy Mcguire on 12/14/2016 16:39:39 Thomas Mcguire, Thomas Mcguire ([id number]) Thomas Mcguire, Thomas Mcguire ([id number]) -------------------------------------------------------------------------------- ROS/PFSH Details Patient Name: Thomas Mcguire, Thomas Mcguire. Date of Service: 12/14/2016 8:15 AM Medical Record Number: [id number] Patient Account Number: [id number] Date of Birth/Sex: [birth year] (76 y.o. Male) Treating RN: Thomas Mcguire Primary Care Provider:  Leanna Mcguire Other Clinician: Referring Provider: Leanna Mcguire Treating Provider/Extender: Thomas Mcguire, Kelden Lavallee Mcguire in Treatment: 1 Information Obtained From Patient Wound History Do you currently have one or more open woundso Yes How many open wounds do you currently haveo 1 Approximately how long have you had your woundso 1 week How have you been treating your wound(s) until nowo 1 week Has your wound(s) ever healed and then re-openedo No Have you had any lab work done in the past montho No Have you tested positive for an antibiotic resistant organism (MRSA, VRE)o No Have you tested positive for osteomyelitis (bone infection)o No Have you had any tests for circulation on your legso Yes Who ordered the testo Dr. Scot Dock several years ago Where was the test doneo [facility] Constitutional Symptoms ([place])  Complaints and Symptoms: Negative for: Fever; Chills Cardiovascular Complaints and Symptoms: Positive for: LE edema Medical History: Positive for: Hypertension Negative for: Angina; Arrhythmia; Congestive Heart Failure; Coronary Artery Disease; Deep Vein Thrombosis; Myocardial Infarction; Peripheral Arterial Disease; Peripheral Venous Disease; Phlebitis; Vasculitis Eyes Medical History: Positive for: Glaucoma Negative for: Cataracts; Optic Neuritis Ear/Nose/Mouth/Throat Medical History: Negative for: Chronic sinus problems/congestion; Middle ear problems Hematologic/Lymphatic Medical History: Positive for: Lymphedema Negative for: Anemia; Hemophilia; Human Immunodeficiency Virus; Sickle Cell Disease Respiratory Thomas Mcguire, FJELSTAD. ([id number]) Complaints and Symptoms: No Complaints or Symptoms Medical History: Negative for: Aspiration; Asthma; Chronic Obstructive Pulmonary Disease (COPD); Pneumothorax; Sleep Apnea; Tuberculosis Gastrointestinal Medical History: Negative for: Cirrhosis ; Colitis; Crohnos; Hepatitis A; Hepatitis B; Hepatitis  C Endocrine Medical History: Positive for: Type II Diabetes - insulin dependant Time with diabetes: over 20 years Treated with: Insulin Blood sugar tested every day: Yes Tested : 4 to 5 times daily Blood sugar testing results: Breakfast: 120; Bedtime: 140 Genitourinary Medical History: Negative for: End Stage Renal Disease Immunological Medical History: Negative for: Lupus Erythematosus; Raynaudos; Scleroderma Integumentary (Skin) Medical History: Negative for: History of Burn; History of pressure wounds Musculoskeletal Medical History: Positive for: Gout Negative for: Rheumatoid Arthritis; Osteoarthritis; Osteomyelitis Neurologic Medical History: Negative for: Dementia; Neuropathy; Quadriplegia; Paraplegia; Seizure Disorder Oncologic Medical History: Negative for: Received Chemotherapy; Received Radiation Psychiatric Complaints and Symptoms: No Complaints or Symptoms Medical History: ALYN, JURNEY ([id number]) Negative for: Anorexia/bulimia; Confinement Anxiety HBO Extended History Items Eyes: Glaucoma Immunizations Pneumococcal Vaccine: Received Pneumococcal Vaccination: Yes Implantable Devices Family and Social History Cancer: No; Diabetes: Yes - Mother,Father; Heart Disease: No; Hypertension: Yes - Mother; Kidney Disease: No; Lung Disease: No; Seizures: No; Stroke: No; Thyroid Problems: No; Tuberculosis: No; Former smoker - quit 24 years ago; Marital Status - Married; Alcohol Use: Never; Drug Use: No History; Caffeine Use: Daily; Financial Concerns: No; Food, Clothing or Mcguire Needs: No; Support System Lacking: No; Transportation Concerns: No; Advanced Directives: Yes (Not Provided); Patient does not want information on Advanced Directives; Living Will: Yes (Not Provided); Medical Power of Attorney: Yes (Not Provided) Physician Affirmation I have reviewed and agree with the above information. Electronic Signature(s) Signed: 12/14/2016 1:56:40 PM By:  Thomas Mcguire Signed: 12/14/2016 5:06:08 PM By: Worthy Keeler PA-C Entered By: Worthy Mcguire on 12/14/2016 09:17:18 ARTAVIS, COWIE ([id number]) -------------------------------------------------------------------------------- SuperBill Details Patient Name: NAYTHAN, DOUTHIT. Date of Service: 12/14/2016 Medical Record Number: [id number] Patient Account Number: [id number] Date of Birth/Sex: 1940-02-06 (76 y.o. 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 E11.42 Type 2 diabetes mellitus with diabetic polyneuropathy Facility Procedures CPT4 Code: [id number] Description: 60109 - WOUND CARE VISIT-LEV 4 EST PT Modifier: Quantity: 1 Physician Procedures CPT4 Code: [id number] Description: 22025 - WC PHYS LEVEL 3 - EST PT ICD-10 Diagnosis Description L97.211 Non-pressure chronic ulcer of right calf limited to breakdown E11.622 Type 2 diabetes mellitus with other skin ulcer I87.311 Chronic venous hypertension (idiopathic) with  ulcer of right l E11.42 Type 2 diabetes mellitus with diabetic polyneuropathy Modifier: of skin ower extremity Quantity: 1 Electronic Signature(s) Signed: 12/14/2016 5:06:08 PM By: Worthy Keeler PA-C Entered By: Worthy Mcguire on 12/14/2016 09:19:36

## 2016-12-15 NOTE — Progress Notes (Signed)
Thomas Mcguire ([id number]) Visit Report for 12/14/2016 Arrival Information Details Patient Name: Thomas Mcguire, Thomas Mcguire. Date of Service: 12/14/2016 8:15 AM Medical Record Number: [id number] Patient Account Number: [id number] Date of Birth/Sex: October 20, 1940 (76 y.o. Male) Treating RN: Roger Shelter Primary Care Katricia Prehn: Leanna Battles Other Clinician: Referring Dmarco Baldus: Leanna Battles Treating Khi Mcmillen/Extender: Melburn Hake, HOYT Weeks in Treatment: 1 Visit Information History Since Last Visit All ordered tests and consults were completed: No Patient Arrived: Ambulatory Added or deleted any medications: No Arrival Time: 08:11 Any new allergies or adverse reactions: No Accompanied By: wife Had a fall or experienced change in No Transfer Assistance: None activities of daily living that may affect Patient Identification Verified: Yes risk of falls: Secondary Verification Process Yes Signs or symptoms of abuse/neglect since last visito No Completed: Hospitalized since last visit: No Patient Requires Transmission-Based No Pain Present Now: No Precautions: Patient Has Alerts: Yes Patient Alerts: ABI [place] BILATERAL Type II diabetes Electronic Signature(s) Signed: 12/14/2016 1:56:40 PM By: Roger Shelter Entered By: Roger Shelter on 12/14/2016 08:14:34 [name] ([id number]) -------------------------------------------------------------------------------- Clinic Level of Care Assessment Details Patient Name: Thomas Mcguire. Date of Service: 12/14/2016 8:15 AM Medical Record Number: [id number] Patient Account Number: [id number] Date of Birth/Sex: [birth year] (76 y.o. Male) Treating RN: Roger Shelter Primary Care Khizar Fiorella: Leanna Battles Other Clinician: Referring Noami Bove: Leanna Battles Treating Nazeer Romney/Extender: Melburn Hake, HOYT Weeks in Treatment: 1 Clinic Level of Care Assessment Items TOOL 4 Quantity Score []  - Use when only an EandM is performed on  FOLLOW-UP visit 0 ASSESSMENTS - Nursing Assessment / Reassessment []  - Reassessment of Co-morbidities (includes updates in patient status) 0 X- 1 5 Reassessment of Adherence to Treatment Plan ASSESSMENTS - Wound and Skin Assessment / Reassessment []  - Simple Wound Assessment / Reassessment - one wound 0 X- 2 5 Complex Wound Assessment / Reassessment - multiple wounds []  - 0 Dermatologic / Skin Assessment (not related to wound area) ASSESSMENTS - Focused Assessment X - Circumferential Edema Measurements - multi extremities 2 5 []  - 0 Nutritional Assessment / Counseling / Intervention []  - 0 Lower Extremity Assessment (monofilament, tuning fork, pulses) []  - 0 Peripheral Arterial Disease Assessment (using hand held doppler) ASSESSMENTS - Ostomy and/or Continence Assessment and Care []  - Incontinence Assessment and Management 0 []  - 0 Ostomy Care Assessment and Management (repouching, etc.) PROCESS - Coordination of Care []  - Simple Patient / Family Education for ongoing care 0 X- 1 20 Complex (extensive) Patient / Family Education for ongoing care X- 1 10 Staff obtains Programmer, systems, Records, Test Results / Process Orders []  - 0 Staff telephones HHA, Nursing Homes / Clarify orders / etc []  - 0 Routine Transfer to another Facility (non-emergent condition) []  - 0 Routine Hospital Admission (non-emergent condition) []  - 0 New Admissions / Biomedical engineer / Ordering NPWT, Apligraf, etc. []  - 0 Emergency Hospital Admission (emergent condition) []  - 0 Simple Discharge Coordination GIORDANO, GETMAN ([id number]) X- 1 15 Complex (extensive) Discharge Coordination PROCESS - Special Needs []  - Pediatric / Minor Patient Management 0 []  - 0 Isolation Patient Management []  - 0 Hearing / Language / Visual special needs []  - 0 Assessment of Community assistance (transportation, D/C planning, etc.) []  - 0 Additional assistance / Altered mentation []  - 0 Support Surface(s)  Assessment (bed, cushion, seat, etc.) INTERVENTIONS - Wound Cleansing / Measurement []  - Simple Wound Cleansing - one wound 0 X- 2 5 Complex Wound Cleansing - multiple wounds X- 1 5 Wound Imaging (photographs - any number  of wounds) []  - 0 Wound Tracing (instead of photographs) []  - 0 Simple Wound Measurement - one wound X- 2 5 Complex Wound Measurement - multiple wounds INTERVENTIONS - Wound Dressings []  - Small Wound Dressing one or multiple wounds 0 X- 2 15 Medium Wound Dressing one or multiple wounds []  - 0 Large Wound Dressing one or multiple wounds []  - 0 Application of Medications - topical []  - 0 Application of Medications - injection INTERVENTIONS - Miscellaneous []  - External ear exam 0 []  - 0 Specimen Collection (cultures, biopsies, blood, body fluids, etc.) []  - 0 Specimen(s) / Culture(s) sent or taken to Lab for analysis []  - 0 Patient Transfer (multiple staff / Civil Service fast streamer / Similar devices) []  - 0 Simple Staple / Suture removal (25 or less) []  - 0 Complex Staple / Suture removal (26 or more) []  - 0 Hypo / Hyperglycemic Management (close monitor of Blood Glucose) []  - 0 Ankle / Brachial Index (ABI) - do not check if billed separately X- 1 5 Vital Signs JABREEL, CHIMENTO ([id number]) Has the patient been seen at the hospital within the last three years: Yes Total Score: 130 Level Of Care: New/Established - Level 4 Electronic Signature(s) Signed: 12/14/2016 1:56:40 PM By: Roger Shelter Entered By: Roger Shelter on 12/14/2016 09:18:09 [name] ([id number]) -------------------------------------------------------------------------------- Encounter Discharge Information Details Patient Name: Thomas Mcguire. Date of Service: 12/14/2016 8:15 AM Medical Record Number: [id number] Patient Account Number: [id number] Date of Birth/Sex: [birth year] (76 y.o. Male) Treating RN: Roger Shelter Primary Care Melodee Lupe: Leanna Battles Other  Clinician: Referring Jaxton Casale: Leanna Battles Treating Dejion Grillo/Extender: Melburn Hake, HOYT Weeks in Treatment: 1 Encounter Discharge Information Items Discharge Pain Level: 0 Discharge Condition: Stable Ambulatory Status: Ambulatory Discharge Destination: Home Transportation: Private Auto Accompanied By: self Schedule Follow-up Appointment: Yes Medication Reconciliation completed and No provided to Patient/Care Marykathryn Carboni: Patient Clinical Summary of Care: Declined Electronic Signature(s) Signed: 12/14/2016 4:48:37 PM By: Ruthine Dose Entered By: Ruthine Dose on 12/14/2016 09:29:23 [name] ([id number]) -------------------------------------------------------------------------------- Lower Extremity Assessment Details Patient Name: DELOIS, TOLBERT. Date of Service: 12/14/2016 8:15 AM Medical Record Number: [id number] Patient Account Number: [id number] Date of Birth/Sex: [birth year] (76 y.o. Male) Treating RN: Roger Shelter Primary Care Amoni Morales: Leanna Battles Other Clinician: Referring Shaelyn Decarli: Leanna Battles Treating Kaiyana Bedore/Extender: Melburn Hake, HOYT Weeks in Treatment: 1 Edema Assessment Assessed: [Left: No] [Right: No] Edema: [Left: Yes] [Right: Yes] Calf Left: Right: Point of Measurement: 38 cm From Medial Instep 43.6 cm 41.2 cm Ankle Left: Right: Point of Measurement: 13 cm From Medial Instep 31 cm 28.7 cm Vascular Assessment Claudication: Claudication Assessment [Left:None] [Right:None] Pulses: Dorsalis Pedis Palpable: [Left:Yes] [Right:Yes] Posterior Tibial Extremity colors, hair growth, and conditions: Extremity Color: [Left:Normal] [Right:Normal] Hair Growth on Extremity: [Left:No] [Right:No] Temperature of Extremity: [Left:Warm] [Right:Warm] Capillary Refill: [Right:> 3 seconds] Toe Nail Assessment Left: Right: Thick: Yes Discolored: Yes Deformed: No Improper Length and Hygiene: No Electronic Signature(s) Signed: 12/14/2016  1:56:40 PM By: Roger Shelter Entered By: Roger Shelter on 12/14/2016 08:31:41 [name] ([id number]) -------------------------------------------------------------------------------- Multi Wound Chart Details Patient Name: [name]. Date of Service: 12/14/2016 8:15 AM Medical Record Number: [id number] Patient Account Number: [id number] Date of Birth/Sex: 09-05-1940 (76 y.o. Male) Treating RN: Roger Shelter Primary Care Ambriella Kitt: Leanna Battles Other Clinician: Referring Waunita Sandstrom: Leanna Battles Treating Virgilene Stryker/Extender: STONE III, HOYT Weeks in Treatment: 1 Vital Signs Height(in): 79 Pulse(bpm): 46 Weight(lbs): 245 Blood Pressure(mmHg): 182/71 Body Mass Index(BMI): 28 Temperature(F): 98.1 Respiratory Rate 18 (breaths/min): Photos: [1:No Photos] [2:No Photos] [  N/A:N/A] Wound Location: [1:Right, Medial Lower Leg] [2:Left Lower Leg - Medial] [N/A:N/A] Wounding Event: [1:Gradually Appeared] [2:Gradually Appeared] [N/A:N/A] Primary Etiology: [1:Diabetic Wound/Ulcer of the Lower Extremity] [2:Diabetic Wound/Ulcer of the Lower Extremity] [N/A:N/A] Secondary Etiology: [1:Lymphedema] [2:N/A] [N/A:N/A] Comorbid History: [1:N/A] [2:Glaucoma, Lymphedema, Hypertension, Type II Diabetes, Gout] [N/A:N/A] Date Acquired: [1:11/28/2016] [2:12/10/2016] [N/A:N/A] Weeks of Treatment: [1:1] [2:0] [N/A:N/A] Wound Status: [1:Open] [2:Open] [N/A:N/A] Measurements L x W x D [1:1.2x1.2x0.1] [2:2.2x0.6x0.1] [N/A:N/A] (cm) Area (cm) : [1:1.131] [2:1.037] [N/A:N/A] Volume (cm) : [1:0.113] [2:0.104] [N/A:N/A] % Reduction in Area: [1:36.00%] [2:N/A] [N/A:N/A] % Reduction in Volume: [1:36.20%] [2:N/A] [N/A:N/A] Classification: [1:Partial Thickness] [2:Grade 1] [N/A:N/A] Exudate Amount: [1:N/A] [2:None Present] [N/A:N/A] Wound Margin: [1:N/A] [2:Flat and Intact] [N/A:N/A] Granulation Amount: [1:N/A] [2:Small (1-33%)] [N/A:N/A] Granulation Quality: [1:N/A] [2:Red]  [N/A:N/A] Necrotic Amount: [1:N/A] [2:Large (67-100%)] [N/A:N/A] Necrotic Tissue: [1:N/A] [2:Eschar] [N/A:N/A] Epithelialization: [1:N/A] [2:Large (67-100%)] [N/A:N/A] Periwound Skin Texture: [1:No Abnormalities Noted] [2:Excoriation: No Induration: No Callus: No Crepitus: No Rash: No Scarring: No] [N/A:N/A] Periwound Skin Moisture: [1:No Abnormalities Noted] [2:Maceration: No Dry/Scaly: No] [N/A:N/A] Periwound Skin Color: [1:No Abnormalities Noted] [2:Atrophie Blanche: No Cyanosis: No Ecchymosis: No] [N/A:N/A] Erythema: No Hemosiderin Staining: No Mottled: No Pallor: No Rubor: No Tenderness on Palpation: No No N/A Wound Preparation: N/A Ulcer Cleansing: N/A Rinsed/Irrigated with Saline Topical Anesthetic Applied: Other: lidocaine 4% Treatment Notes Electronic Signature(s) Signed: 12/14/2016 1:56:40 PM By: Roger Shelter Entered By: Roger Shelter on 12/14/2016 08:31:57 [name] ([id number]) -------------------------------------------------------------------------------- [place] Details Patient Name: JARQUAVIOUS, FENTRESS. Date of Service: 12/14/2016 8:15 AM Medical Record Number: [id number] Patient Account Number: [id number] Date of Birth/Sex: 1940-03-01 (76 y.o. Male) Treating RN: Roger Shelter Primary Care Tallan Sandoz: Leanna Battles Other Clinician: Referring Karnisha Lefebre: Leanna Battles Treating Brenae Lasecki/Extender: Melburn Hake, HOYT Weeks in Treatment: 1 Active Inactive ` Peripheral Neuropathy Nursing Diagnoses: Knowledge deficit related to disease process and management of peripheral neurovascular dysfunction Goals: Patient/caregiver will verbalize understanding of disease process and disease management Date Initiated: 12/07/2016 Target Resolution Date: 12/28/2016 Goal Status: Active Interventions: Assess signs and symptoms of neuropathy upon admission and as needed Provide education on Management of Neuropathy and Related  Ulcers Notes: ` Wound/Skin Impairment Nursing Diagnoses: Impaired tissue integrity Knowledge deficit related to ulceration/compromised skin integrity Goals: Patient/caregiver will verbalize understanding of skin care regimen Date Initiated: 12/07/2016 Target Resolution Date: 12/28/2016 Goal Status: Active Ulcer/skin breakdown will have a volume reduction of 30% by week 4 Date Initiated: 12/07/2016 Target Resolution Date: 12/28/2016 Goal Status: Active Interventions: Assess patient/caregiver ability to obtain necessary supplies Assess patient/caregiver ability to perform ulcer/skin care regimen upon admission and as needed Assess ulceration(s) every visit Treatment Activities: Skin care regimen initiated : 12/07/2016 Topical wound management initiated : 12/07/2016 Notes: JIBRAN, CROOKSHANKS ([id number]) Electronic Signature(s) Signed: 12/14/2016 1:56:40 PM By: Roger Shelter Entered By: Roger Shelter on 12/14/2016 08:31:47 [name] ([id number]) -------------------------------------------------------------------------------- Pain Assessment Details Patient Name: BRONSEN, SERANO. Date of Service: 12/14/2016 8:15 AM Medical Record Number: [id number] Patient Account Number: [id number] Date of Birth/Sex: 12-19-1940 (76 y.o. Male) Treating RN: Roger Shelter Primary Care Nahal Wanless: Leanna Battles Other Clinician: Referring Alexius Ellington: Leanna Battles Treating Temica Righetti/Extender: Melburn Hake, HOYT Weeks in Treatment: 1 Active Problems Location of Pain Severity and Description of Pain Patient Has Paino No Site Locations Pain Management and Medication Current Pain Management: Electronic Signature(s) Signed: 12/14/2016 1:56:40 PM By: Roger Shelter Entered By: Roger Shelter on 12/14/2016 08:14:42 [name] ([id number]) -------------------------------------------------------------------------------- Patient/Caregiver Education Details Patient Name:  JAKARIE, PEMBER. Date of Service: 12/14/2016 8:15 AM Medical  Record Number: [id number] Patient Account Number: [id number] Date of Birth/Gender: 08-09-1940 (76 y.o. Male) Treating RN: Roger Shelter Primary Care Physician: Leanna Battles Other Clinician: Referring Physician: Leanna Battles Treating Physician/Extender: Sharalyn Ink in Treatment: 1 Education Assessment Education Provided To: Patient and Caregiver Education Topics Provided Wound/Skin Impairment: Methods: Explain/Verbal Responses: State content correctly Electronic Signature(s) Signed: 12/14/2016 1:56:40 PM By: Roger Shelter Entered By: Roger Shelter on 12/14/2016 09:20:01 [name] ([id number]) -------------------------------------------------------------------------------- Wound Assessment Details Patient Name: LOGON, UTTECH. Date of Service: 12/14/2016 8:15 AM Medical Record Number: [id number] Patient Account Number: [id number] Date of Birth/Sex: May 21, 1940 (76 y.o. Male) Treating RN: Roger Shelter Primary Care Keedan Sample: Leanna Battles Other Clinician: Referring Adaijah Endres: Leanna Battles Treating Ameira Alessandrini/Extender: STONE III, HOYT Weeks in Treatment: 1 Wound Status Wound Number: 1 Primary Diabetic Wound/Ulcer of the Lower Etiology: Extremity Wound Location: Right Lower Leg - Medial Secondary Lymphedema Wounding Event: Gradually Appeared Etiology: Date Acquired: 11/28/2016 Wound Status: Open Weeks Of Treatment: 1 Comorbid Glaucoma, Lymphedema, Hypertension, Clustered Wound: No History: Type II Diabetes, Gout Photos Wound Measurements Length: (cm) 1.2 % Reduction in Width: (cm) 1.2 % Reduction in Depth: (cm) 0.1 Epithelializat Area: (cm) 1.131 Volume: (cm) 0.113 Area: 36% Volume: 36.2% ion: Small (1-33%) Wound Description Classification: Grade 1 Foul Odor Afte Wound Margin: Flat and Intact Slough/Fibrino Exudate Amount: Medium Exudate Type: Serous Exudate  Color: amber r Cleansing: No No Wound Bed Granulation Amount: Small (1-33%) Exposed Structure Granulation Quality: Red, Pink Fascia Exposed: No Necrotic Amount: Large (67-100%) Fat Layer (Subcutaneous Tissue) Exposed: Yes Necrotic Quality: Adherent Slough Tendon Exposed: No Muscle Exposed: No Joint Exposed: No Bone Exposed: No Periwound Skin Texture Texture Color ELISEO, WITHERS ([id number]) No Abnormalities Noted: No No Abnormalities Noted: No Callus: No Atrophie Blanche: No Crepitus: No Cyanosis: No Excoriation: No Ecchymosis: No Induration: Yes Erythema: No Rash: No Hemosiderin Staining: No Scarring: No Mottled: No Pallor: No Moisture Rubor: No No Abnormalities Noted: No Dry / Scaly: Yes Temperature / Pain Maceration: No Tenderness on Palpation: Yes Wound Preparation Ulcer Cleansing: Rinsed/Irrigated with Saline Topical Anesthetic Applied: Other: lidocaine 4%, Treatment Notes Wound #1 (Right, Medial Lower Leg) 1. Cleansed with: Cleanse wound with antibacterial soap and water 2. Anesthetic Topical Lidocaine 4% cream to wound bed prior to debridement 4. Dressing Applied: Other dressing (specify in notes) 7. Secured with 3 Layer Compression System - Bilateral Notes silvercell Electronic Signature(s) Signed: 12/14/2016 1:52:53 PM By: Roger Shelter Entered By: Roger Shelter on 12/14/2016 13:52:53 SHOGO, LARKEY ([id number]) -------------------------------------------------------------------------------- Wound Assessment Details Patient Name: DESIDERIO, DOLATA. Date of Service: 12/14/2016 8:15 AM Medical Record Number: [id number] Patient Account Number: [id number] Date of Birth/Sex: December 30, 1940 (76 y.o. Male) Treating RN: Roger Shelter Primary Care Katrena Stehlin: Leanna Battles Other Clinician: Referring Antrone Walla: Leanna Battles Treating Deiontae Rabel/Extender: STONE III, HOYT Weeks in Treatment: 1 Wound Status Wound Number: 2 Primary Diabetic  Wound/Ulcer of the Lower Extremity Etiology: Wound Location: Left Lower Leg - Medial Wound Status: Open Wounding Event: Gradually Appeared Comorbid Glaucoma, Lymphedema, Hypertension, Type Date Acquired: 12/10/2016 History: II Diabetes, Gout Weeks Of Treatment: 0 Clustered Wound: No Photos Wound Measurements Length: (cm) 2.2 Width: (cm) 0.6 Depth: (cm) 0.1 Area: (cm) 1.037 Volume: (cm) 0.104 % Reduction in Area: 0% % Reduction in Volume: 0% Epithelialization: Large (67-100%) Tunneling: No Undermining: No Wound Description Classification: Grade 1 Foul Odor Wound Margin: Flat and Intact Slough/Fi Exudate Amount: None Present After Cleansing: No brino No Wound Bed Granulation Amount: Small (1-33%) Exposed Structure Granulation Quality: Red Fascia Exposed: No  Necrotic Amount: Large (67-100%) Fat Layer (Subcutaneous Tissue) Exposed: No Necrotic Quality: Eschar Tendon Exposed: No Muscle Exposed: No Joint Exposed: No Bone Exposed: No Periwound Skin Texture Texture Color No Abnormalities Noted: No No Abnormalities Noted: No Callus: No Atrophie Blanche: No BLAIR, LUNDEEN ([id number]) Crepitus: No Cyanosis: No Excoriation: No Ecchymosis: No Induration: No Erythema: No Rash: No Hemosiderin Staining: No Scarring: No Mottled: No Pallor: No Moisture Rubor: No No Abnormalities Noted: No Dry / Scaly: No Maceration: No Wound Preparation Ulcer Cleansing: Rinsed/Irrigated with Saline Topical Anesthetic Applied: Other: lidocaine 4%, Treatment Notes Wound #2 (Left, Medial Lower Leg) 1. Cleansed with: Cleanse wound with antibacterial soap and water 2. Anesthetic Topical Lidocaine 4% cream to wound bed prior to debridement 4. Dressing Applied: Other dressing (specify in notes) 7. Secured with 3 Layer Compression System - Bilateral Notes silvercell Electronic Signature(s) Signed: 12/14/2016 1:53:51 PM By: Roger Shelter Entered By: Roger Shelter on  12/14/2016 13:53:51 [name] ([id number]) -------------------------------------------------------------------------------- [place] Details Patient Name: ASIM, GERSTEN. Date of Service: 12/14/2016 8:15 AM Medical Record Number: [id number] Patient Account Number: [id number] Date of Birth/Sex: 1940/04/06 (76 y.o. Male) Treating RN: Roger Shelter Primary Care Corra Kaine: Leanna Battles Other Clinician: Referring Lenord Fralix: Leanna Battles Treating Deja Pisarski/Extender: Melburn Hake, HOYT Weeks in Treatment: 1 Vital Signs Time Taken: 08:14 Temperature (F): 98.1 Height (in): 79 Pulse (bpm): 57 Weight (lbs): 245 Respiratory Rate (breaths/min): 18 Body Mass Index (BMI): 27.6 Blood Pressure (mmHg): 182/71 Reference Range: 80 - 120 mg / dl Electronic Signature(s) Signed: 12/14/2016 1:56:40 PM By: Roger Shelter Entered By: Roger Shelter on 12/14/2016 08:15:50

## 2016-12-21 ENCOUNTER — Ambulatory Visit: Payer: Medicare Other | Admitting: Internal Medicine

## 2016-12-22 ENCOUNTER — Encounter: Payer: Medicare Other | Admitting: Internal Medicine

## 2016-12-22 DIAGNOSIS — E11622 Type 2 diabetes mellitus with other skin ulcer: Secondary | ICD-10-CM | POA: Diagnosis not present

## 2016-12-23 ENCOUNTER — Ambulatory Visit (HOSPITAL_COMMUNITY)
Admission: RE | Admit: 2016-12-23 | Discharge: 2016-12-23 | Disposition: A | Discharge status: Home / Self Care | Payer: Medicare Other | Source: Ambulatory Visit | Attending: Surgery | Admitting: Surgery

## 2016-12-23 DIAGNOSIS — R0989 Other specified symptoms and signs involving the circulatory and respiratory systems: Secondary | ICD-10-CM | POA: Diagnosis present

## 2016-12-23 DIAGNOSIS — I739 Peripheral vascular disease, unspecified: Secondary | ICD-10-CM | POA: Insufficient documentation

## 2016-12-23 DIAGNOSIS — I70203 Unspecified atherosclerosis of native arteries of extremities, bilateral legs: Secondary | ICD-10-CM | POA: Insufficient documentation

## 2016-12-23 DIAGNOSIS — L97519 Non-pressure chronic ulcer of other part of right foot with unspecified severity: Secondary | ICD-10-CM

## 2016-12-23 LAB — VAS US LOWER EXTREMITY ARTERIAL DUPLEX
Left ant tibial distal sys: 12 cm/s
Left super femoral dist sys PSV: 0 cm/s
Left super femoral mid sys PSV: 17 cm/s
Left super femoral prox sys PSV: 59 cm/s
RIGHT POST TIB DIST SYS: 79 cm/s
Right super femoral dist sys PSV: -126 cm/s
Right super femoral mid sys PSV: -125 cm/s
Right super femoral prox sys PSV: -107 cm/s
left post tibial dist sys: -59 cm/s

## 2016-12-24 DIAGNOSIS — E11622 Type 2 diabetes mellitus with other skin ulcer: Secondary | ICD-10-CM | POA: Diagnosis not present

## 2016-12-25 NOTE — Progress Notes (Addendum)
MERYL, Mcguire ([id number]) Visit Report for 12/22/2016 HPI Details Patient Name: Thomas Mcguire, Thomas Mcguire. Date of Service: 12/22/2016 2:45 PM Medical Record Number: [id number] Patient Account Number: [id number] Date of Birth/Sex: [birth year] (76 y.o. 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 He is here for review of wound on the right lower leg. 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 He has chronic edema in his lower legs and uses 20-30 mm compression stockings which she is compliant with. He is a nonsmoker. Beside his diabetes he has hypertension and hypercholesterolemia and in 2013 he had stage III chronic renal failure. He tells me his primary physician Dr. Valetta Fuller at [facility] follows him every 3 months for his kidney failure. He also states he saw a nephrologist once. ABIs in this clinic were noncompressible. He tells me he saw Dr. Doren Custard in 6 or 7 years ago at [facility] in [place]. He does not remember what the issue was. It would appear that the patient has had recurrent blistering in the lower extremities as there are several healed areas on the left anterior and right anterior calves 12/14/16 on evaluation today patient appears to be doing very well with the current three layer compression wraps. His right lower extremity is significantly improved as far as the swelling is concerned. He has been tolerating the wraps without any complication. The wounds also appear to be doing better compared to last week's evaluation. Unfortunately he does have a new dramatic wound to the left interior shin but  otherwise things are looking up. No fevers chills noted. Patient does have a appointment with [facility] [place] on December 23, 2016 four arterial studies. 12/22/16 area x2 on right leg are closed. Still one open area on left leg. arterial studies tomorrow Electronic Signature(s) Signed: 12/22/2016 5:01:55 PM By: Linton Ham MD Entered By: Linton Ham on 12/22/2016 16:28:50 Thomas Mcguire ([id number]) -------------------------------------------------------------------------------- Physical Exam Details Patient Name: Thomas Mcguire, Thomas Mcguire. Date of Service: 12/22/2016 2:45 PM Medical Record Number: [id number] Patient Account Number: [id number] Date of Birth/Sex: [birth year] (76 y.o. Male) Treating RN: Thomas Mcguire Primary Care Provider: Leanna Mcguire Other Clinician: Referring Provider: Leanna Mcguire Treating Provider/Extender: Thomas Mcguire Weeks in Treatment: 2 Constitutional Patient is hypertensive.. Pulse regular and within target range for patient.Marland Kitchen Respirations regular, non-labored and within target range.. Temperature is normal and within the target range for the patient.Marland Kitchen appears in no distress. Eyes Conjunctivae clear. No discharge. Respiratory Respiratory effort is easy and symmetric bilaterally. Rate is normal at rest and on room air.. Cardiovascular Pedal pulses palpable and strong bilaterally.. Edema present in both extremities.. Lymphatic none palpable in the popliteal or inguinal area. Psychiatric No evidence of depression, anxiety, or agitation. Calm, cooperative, and communicative. Appropriate interactions and affect.. Notes wound exam; the area over the left anterior shin as a nice clean healthy-looking base. This appears to be closing down. The 2 areas on the right are both closed. No debridement required. No evidence of surrounding infection Electronic Signature(s) Signed: 12/22/2016 5:01:55 PM By: Linton Ham MD Entered By:  Linton Ham on 12/22/2016 16:31:42 Thomas Mcguire ([id number]) -------------------------------------------------------------------------------- Physician Orders Details Patient Name: Thomas Mcguire, Thomas Mcguire. Date of Service:  12/22/2016 2:45 PM Medical Record Number: [id number] Patient Account Number: [id number] Date of Birth/Sex: [birth year] (76 y.o. Male) Treating RN: Thomas Mcguire Primary Care Provider: Leanna Mcguire Other Clinician: Referring Provider: Leanna Mcguire Treating Provider/Extender: Tito Dine in Treatment: 2 Verbal / Phone Orders: No Diagnosis Coding Wound Cleansing Wound #1 Right,Medial Lower Leg o Clean wound with Normal Saline. Wound #2 Left,Medial Lower Leg o Clean wound with Normal Saline. Anesthetic (add to Medication List) Wound #1 Right,Medial Lower Leg o Topical Lidocaine 4% cream applied to wound bed prior to debridement (In Clinic Only). Wound #2 Left,Medial Lower Leg o Topical Lidocaine 4% cream applied to wound bed prior to debridement (In Clinic Only). Primary Wound Dressing Wound #2 Left,Medial Lower Leg o Other: - silvercel Dressing Change Frequency Wound #1 Right,Medial Lower Leg o Other: - patient to return friday 12/14 for nurse visit for dressing change Wound #2 Left,Medial Lower Leg o Other: - patient to return friday 12/14 for nurse visit for dressing change Edema Control o Patient to wear own compression stockings o Elevate legs to the level of the heart and pump ankles as often as possible Electronic Signature(s) Signed: 12/22/2016 4:37:14 PM By: Thomas Mcguire Signed: 12/22/2016 5:01:55 PM By: Linton Ham MD Entered By: Thomas Mcguire on 12/22/2016 15:38:16 Thomas Mcguire ([id number]) -------------------------------------------------------------------------------- Problem List Details Patient Name: [name], [name]. Date of Service: 12/22/2016 2:45 PM Medical Record Number:  [id number] Patient Account Number: [id number] Date of Birth/Sex: August 16, 1940 (76 y.o. 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 Date of Service: 12/22/2016 2:45 PM Medical Record Number: [id number] Patient Account Number: [id number] Date of Birth/Sex: August 28, 1940 (76 y.o. 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 He is here for review of wound on the right lower leg. 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 He has chronic edema in his lower legs and uses 20-30 mm compression stockings which she is compliant with. He is a nonsmoker. Beside his diabetes he has  hypertension and hypercholesterolemia and in 2013 he had stage III chronic renal failure. He tells me his primary physician Dr. Valetta Fuller at [facility] follows him every 3 months for his kidney failure. He also states he saw a nephrologist once. ABIs in this clinic were noncompressible. He tells me he saw Dr. Doren Custard in 6 or 7 years ago at [facility] in [place]. He does not remember what the issue was. It would appear that the patient has had recurrent blistering in the lower  extremities as there are several healed areas on the left anterior and right anterior calves 12/14/16 on evaluation today patient appears to be doing very well with the current three layer compression wraps. His right lower extremity is significantly improved as far as the swelling is concerned. He has been tolerating the wraps without any complication. The wounds also appear to be doing better compared to last week's evaluation. Unfortunately he does have a new dramatic wound to the left interior shin but otherwise things are looking up. No fevers chills noted. Patient does have a appointment with [facility] on December 23, 2016 four arterial studies. 12/22/16 area x2 on right leg are closed. Still one open area on left leg. arterial studies tomorrow Objective Constitutional Patient is hypertensive.. Pulse regular and within target range for patient.Marland Kitchen Respirations regular, non-labored and within target range.. Temperature is normal and within the target range for the patient.Marland Kitchen appears in no distress. Vitals Time Taken: 2:58 AM, Height: 79 in, Weight: 245 lbs, BMI: 27.6, Temperature: 97.7 F, Pulse: 48 bpm, Respiratory Rate: 18 breaths/min, Blood Pressure: 178/61 mmHg. Eyes Conjunctivae clear. No discharge. Respiratory Respiratory effort is easy and symmetric bilaterally. Rate is normal at rest and on room air.. Cardiovascular Pedal pulses palpable and strong  bilaterally.. Edema present in both extremities.Thomas Mcguire, Thomas Mcguire ([id number]) Lymphatic none palpable in the popliteal or inguinal area. Psychiatric No evidence of depression, anxiety, or agitation. Calm, cooperative, and communicative. Appropriate interactions and affect.. General Notes: wound exam; the area over the left anterior shin as a nice clean healthy-looking base. This appears to be closing down. The 2 areas on the right are both closed. No debridement required. No evidence of surrounding infection Integumentary (Hair, Skin) Wound #1 status is Open. Original cause of wound was Gradually Appeared. The wound is located on the Right,Medial Lower Leg. The wound measures 1.2cm length x 1cm width x 0.1cm depth; 0.942cm^2 area and 0.094cm^3 volume. There is Fat Layer (Subcutaneous Tissue) Exposed exposed. There is no tunneling or undermining noted. There is a none present amount of drainage noted. The wound margin is flat and intact. There is no granulation within the wound bed. There is a large (67- 100%) amount of necrotic tissue within the wound bed including Eschar. The periwound skin appearance exhibited: Induration, Dry/Scaly. The periwound skin appearance did not exhibit: Callus, Crepitus, Excoriation, Rash, Scarring, Maceration, Atrophie Blanche, Cyanosis, Ecchymosis, Hemosiderin Staining, Mottled, Pallor, Rubor, Erythema. The periwound has tenderness on palpation. Wound #2 status is Open. Original cause of wound was Gradually Appeared. The wound is located on the Left,Medial Lower Leg. The wound measures 0.6cm length x 1.2cm width x 0.1cm depth; 0.565cm^2 area and 0.057cm^3 volume. There is Fat Layer (Subcutaneous Tissue) Exposed exposed. There is no tunneling or undermining noted. There is a medium amount of serosanguineous drainage noted. The wound margin is flat and intact. There is large (67-100%) red granulation within the wound bed. There is a small (1-33%) amount of  necrotic tissue within the wound bed including Adherent Slough. The periwound skin appearance exhibited: Dry/Scaly. The periwound skin appearance did not exhibit: Callus, Crepitus, Excoriation, Induration, Rash, Scarring, Maceration, Atrophie Blanche, Cyanosis, Ecchymosis, Hemosiderin Staining, Mottled, Pallor, Rubor, Erythema. Assessment Active Problems ICD-10 L97.211 - Non-pressure chronic ulcer of right calf limited to breakdown of skin E11.622 - Type 2 diabetes mellitus with other skin ulcer I87.311 - Chronic venous hypertension (idiopathic) with ulcer of right lower extremity E11.42 - Type 2 diabetes mellitus with diabetic polyneuropathy L97.221 -  Non-pressure chronic ulcer of left calf limited to breakdown of skin Plan Wound Cleansing: Wound #1 Right,Medial Lower Leg: Clean wound with Normal Saline. Wound #2 Left,Medial Lower Leg: Clean wound with Normal Saline. Anesthetic (add to Medication List): Wound #1 Right,Medial Lower Leg: Topical Lidocaine 4% cream applied to wound bed prior to debridement (In Clinic Only). Thomas Mcguire, Thomas Mcguire ([id number]) Wound #2 Left,Medial Lower Leg: Topical Lidocaine 4% cream applied to wound bed prior to debridement (In Clinic Only). Primary Wound Dressing: Wound #2 Left,Medial Lower Leg: Other: - silvercel Dressing Change Frequency: Wound #1 Right,Medial Lower Leg: Other: - patient to return friday 12/14 for nurse visit for dressing change Wound #2 Left,Medial Lower Leg: Other: - patient to return friday 12/14 for nurse visit for dressing change Edema Control: Patient to wear own compression stockings Elevate legs to the level of the heart and pump ankles as often as possible #1 follow-up vascular studies tomorrow #2 patient will be back on Friday morning to change the dressing #3continue with silver cell to the left anterior lower leg #4 I put compression on the right leg to make sure the surface of 2 small wounds here maintains Electronic  Signature(s) Signed: 12/31/2016 10:54:44 AM By: Gretta Cool, BSN, RN, CWS, Kim RN, BSN Signed: 01/02/2017 7:29:02 AM By: Linton Ham MD Previous Signature: 12/22/2016 5:01:55 PM Version By: Linton Ham MD Entered By: Gretta Cool, BSN, RN, CWS, Kim on 12/31/2016 10:54:43 Thomas Mcguire, Thomas Mcguire ([id number]) -------------------------------------------------------------------------------- [place] Details Patient Name: Thomas Mcguire, Thomas Mcguire. Date of Service: 12/22/2016 Medical Record Number: [id number] Patient Account Number: [id number] Date of Birth/Sex: 29-Apr-1940 (77 y.o. 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 E11.42 Type 2 diabetes mellitus with diabetic polyneuropathy L97.221 Non-pressure chronic ulcer of left calf limited to breakdown of skin Facility Procedures CPT4 Code: [id number] Description: 99213 - WOUND CARE VISIT-LEV 3 EST PT Modifier: Quantity: 1 Physician Procedures CPT4 Code: [id number] Description: 69794 - WC PHYS LEVEL 3 - EST PT ICD-10 Diagnosis Description L97.211 Non-pressure chronic ulcer of right calf limited to breakdown L97.221 Non-pressure chronic ulcer of left calf limited to breakdown Modifier: of skin of skin Quantity: 1 Electronic Signature(s) Signed: 12/22/2016 5:01:55 PM By: Linton Ham MD Entered By: Linton Ham on 12/22/2016 16:33:05

## 2016-12-25 NOTE — Progress Notes (Addendum)
Thomas Mcguire ([id number]) Visit Report for 12/22/2016 Arrival Information Details Patient Name: Thomas Mcguire, Thomas Mcguire. Date of Service: 12/22/2016 2:45 PM Medical Record Number: [id number] Patient Account Number: [id number] Date of Birth/Sex: Feb 26, 1940 (76 y.o. Male) Treating RN: Roger Shelter Primary Care Esteen Delpriore: Leanna Battles Other Clinician: Referring Casy Tavano: Leanna Battles Treating Amen Staszak/Extender: Tito Dine in Treatment: 2 Visit Information History Since Last Visit All ordered tests and consults were completed: No Patient Arrived: Ambulatory Added or deleted any medications: No Arrival Time: 14:57 Any new allergies or adverse reactions: No Accompanied By: wife Had a fall or experienced change in No Transfer Assistance: None activities of daily living that may affect Patient Identification Verified: Yes risk of falls: Secondary Verification Process Yes Signs or symptoms of abuse/neglect since last visito No Completed: Hospitalized since last visit: No Patient Requires Transmission-Based No Has Dressing in Place as Prescribed: Yes Precautions: Pain Present Now: No Patient Has Alerts: Yes Patient Alerts: ABI [place] BILATERAL Type II diabetes Electronic Signature(s) Signed: 12/24/2016 4:53:17 PM By: Alric Quan Previous Signature: 12/22/2016 4:37:14 PM Version By: Roger Shelter Entered By: Alric Quan on 12/24/2016 15:19:23 [name] ([id number]) -------------------------------------------------------------------------------- Clinic Level of Care Assessment Details Patient Name: Thomas Mcguire. Date of Service: 12/22/2016 2:45 PM Medical Record Number: [id number] Patient Account Number: [id number] Date of Birth/Sex: Oct 08, 1940 (76 y.o. Male) Treating RN: Roger Shelter Primary Care Ramonia Mcclaran: Leanna Battles Other Clinician: Referring Kaeleigh Westendorf: Leanna Battles Treating Caius Silbernagel/Extender: Tito Dine  in Treatment: 2 Clinic Level of Care Assessment Items TOOL 4 Quantity Score []  - Use when only an EandM is performed on FOLLOW-UP visit 0 ASSESSMENTS - Nursing Assessment / Reassessment []  - Reassessment of Co-morbidities (includes updates in patient status) 0 X- 1 5 Reassessment of Adherence to Treatment Plan ASSESSMENTS - Wound and Skin Assessment / Reassessment []  - Simple Wound Assessment / Reassessment - one wound 0 X- 2 5 Complex Wound Assessment / Reassessment - multiple wounds []  - 0 Dermatologic / Skin Assessment (not related to wound area) ASSESSMENTS - Focused Assessment []  - Circumferential Edema Measurements - multi extremities 0 []  - 0 Nutritional Assessment / Counseling / Intervention []  - 0 Lower Extremity Assessment (monofilament, tuning fork, pulses) []  - 0 Peripheral Arterial Disease Assessment (using hand held doppler) ASSESSMENTS - Ostomy and/or Continence Assessment and Care []  - Incontinence Assessment and Management 0 []  - 0 Ostomy Care Assessment and Management (repouching, etc.) PROCESS - Coordination of Care []  - Simple Patient / Family Education for ongoing care 0 X- 1 20 Complex (extensive) Patient / Family Education for ongoing care []  - 0 Staff obtains Programmer, systems, Records, Test Results / Process Orders []  - 0 Staff telephones HHA, Nursing Homes / Clarify orders / etc []  - 0 Routine Transfer to another Facility (non-emergent condition) []  - 0 Routine Hospital Admission (non-emergent condition) []  - 0 New Admissions / Biomedical engineer / Ordering NPWT, Apligraf, etc. []  - 0 Emergency Hospital Admission (emergent condition) []  - 0 Simple Discharge Coordination MATHEU, PLOEGER ([id number]) X- 1 15 Complex (extensive) Discharge Coordination PROCESS - Special Needs []  - Pediatric / Minor Patient Management 0 []  - 0 Isolation Patient Management []  - 0 Hearing / Language / Visual special needs []  - 0 Assessment of Community  assistance (transportation, D/C planning, etc.) []  - 0 Additional assistance / Altered mentation []  - 0 Support Surface(s) Assessment (bed, cushion, seat, etc.) INTERVENTIONS - Wound Cleansing / Measurement []  - Simple Wound Cleansing - one wound 0 X- 2 5  Complex Wound Cleansing - multiple wounds X- 1 5 Wound Imaging (photographs - any number of wounds) []  - 0 Wound Tracing (instead of photographs) []  - 0 Simple Wound Measurement - one wound X- 2 5 Complex Wound Measurement - multiple wounds INTERVENTIONS - Wound Dressings []  - Small Wound Dressing one or multiple wounds 0 X- 2 15 Medium Wound Dressing one or multiple wounds []  - 0 Large Wound Dressing one or multiple wounds []  - 0 Application of Medications - topical []  - 0 Application of Medications - injection INTERVENTIONS - Miscellaneous []  - External ear exam 0 []  - 0 Specimen Collection (cultures, biopsies, blood, body fluids, etc.) []  - 0 Specimen(s) / Culture(s) sent or taken to Lab for analysis []  - 0 Patient Transfer (multiple staff / Civil Service fast streamer / Similar devices) []  - 0 Simple Staple / Suture removal (25 or less) []  - 0 Complex Staple / Suture removal (26 or more) []  - 0 Hypo / Hyperglycemic Management (close monitor of Blood Glucose) []  - 0 Ankle / Brachial Index (ABI) - do not check if billed separately X- 1 5 Vital Signs SHOJI, PERTUIT ([id number]) Has the patient been seen at the hospital within the last three years: Yes Total Score: 110 Level Of Care: New/Established - Level 3 Electronic Signature(s) Signed: 12/22/2016 4:37:14 PM By: Roger Shelter Entered By: Roger Shelter on 12/22/2016 15:39:02 [name] ([id number]) -------------------------------------------------------------------------------- Encounter Discharge Information Details Patient Name: Thomas Mcguire. Date of Service: 12/22/2016 2:45 PM Medical Record Number: [id number] Patient Account Number:  [id number] Date of Birth/Sex: 1940-01-14 (76 y.o. Male) Treating RN: Roger Shelter Primary Care Zahria Ding: Leanna Battles Other Clinician: Referring Lavena Loretto: Leanna Battles Treating Joely Losier/Extender: Tito Dine in Treatment: 2 Encounter Discharge Information Items Discharge Pain Level: 0 Discharge Condition: Stable Ambulatory Status: Ambulatory Discharge Destination: Home Transportation: Other Accompanied By: wife Schedule Follow-up Appointment: Yes Medication Reconciliation completed and No provided to Patient/Care Chevelle Durr: Patient Clinical Summary of Care: Declined Electronic Signature(s) Signed: 12/22/2016 4:37:14 PM By: Roger Shelter Entered By: Roger Shelter on 12/22/2016 15:40:32 [name] ([id number]) -------------------------------------------------------------------------------- Lower Extremity Assessment Details Patient Name: BRAYLEN, STALLER. Date of Service: 12/22/2016 2:45 PM Medical Record Number: [id number] Patient Account Number: [id number] Date of Birth/Sex: 05/09/1940 (75 y.o. Male) Treating RN: Roger Shelter Primary Care Caeson Filippi: Leanna Battles Other Clinician: Referring Yahaira Bruski: Leanna Battles Treating Tanairi Cypert/Extender: Ricard Dillon Weeks in Treatment: 2 Edema Assessment Assessed: [Left: No] [Right: No] Edema: [Left: No] [Right: No] Vascular Assessment Claudication: Claudication Assessment [Left:None] [Right:None] Pulses: Dorsalis Pedis Palpable: [Left:Yes] [Right:Yes] Posterior Tibial Extremity colors, hair growth, and conditions: Extremity Color: [Left:Normal] [Right:Normal] Hair Growth on Extremity: [Left:No] [Right:No] Temperature of Extremity: [Left:Cool] [Right:Cool] Capillary Refill: [Left:> 3 seconds] [Right:< 3 seconds] Toe Nail Assessment Left: Right: Thick: No No Discolored: No No Deformed: Yes Yes Improper Length and Hygiene: Yes Yes Electronic Signature(s) Signed: 12/22/2016  4:37:14 PM By: Roger Shelter Entered By: Roger Shelter on 12/22/2016 15:17:02 [name] ([id number]) -------------------------------------------------------------------------------- Multi Wound Chart Details Patient Name: [name]. Date of Service: 12/22/2016 2:45 PM Medical Record Number: [id number] Patient Account Number: [id number] Date of Birth/Sex: 1940/01/29 (76 y.o. Male) Treating RN: Roger Shelter Primary Care Hurley Blevins: Leanna Battles Other Clinician: Referring Jerlisa Diliberto: Leanna Battles Treating Odyn Turko/Extender: Ricard Dillon Weeks in Treatment: 2 Vital Signs Height(in): 79 Pulse(bpm): 48 Weight(lbs): 245 Blood Pressure(mmHg): 178/61 Body Mass Index(BMI): 28 Temperature(F): 97.7 Respiratory Rate 18 (breaths/min): Photos: [1:No Photos] [2:No Photos] [N/A:N/A] Wound Location: [1:Right Lower Leg - Medial] [2:Left  Lower Leg - Medial] [N/A:N/A] Wounding Event: [1:Gradually Appeared] [2:Gradually Appeared] [N/A:N/A] Primary Etiology: [1:Diabetic Wound/Ulcer of the Lower Extremity] [2:Diabetic Wound/Ulcer of the Lower Extremity] [N/A:N/A] Secondary Etiology: [1:Lymphedema] [2:N/A] [N/A:N/A] Comorbid History: [1:Glaucoma, Lymphedema, Hypertension, Type II Diabetes, Gout] [2:Glaucoma, Lymphedema, Hypertension, Type II Diabetes, Gout] [N/A:N/A] Date Acquired: [1:11/28/2016] [2:12/10/2016] [N/A:N/A] Weeks of Treatment: [1:2] [2:1] [N/A:N/A] Wound Status: [1:Open] [2:Open] [N/A:N/A] Measurements L x W x D [1:1.2x1x0.1] [2:0.6x1.2x0.1] [N/A:N/A] (cm) Area (cm) : [1:0.942] [2:0.565] [N/A:N/A] Volume (cm) : [1:0.094] [2:0.057] [N/A:N/A] % Reduction in Area: [1:46.70%] [2:45.50%] [N/A:N/A] % Reduction in Volume: [1:46.90%] [2:45.20%] [N/A:N/A] Classification: [1:Grade 1] [2:Grade 1] [N/A:N/A] Exudate Amount: [1:None Present] [2:Medium] [N/A:N/A] Exudate Type: [1:N/A] [2:Serosanguineous] [N/A:N/A] Exudate Color: [1:N/A] [2:red, brown]  [N/A:N/A] Wound Margin: [1:Flat and Intact] [2:Flat and Intact] [N/A:N/A] Granulation Amount: [1:None Present (0%)] [2:Large (67-100%)] [N/A:N/A] Granulation Quality: [1:N/A] [2:Red] [N/A:N/A] Necrotic Amount: [1:Large (67-100%)] [2:Small (1-33%)] [N/A:N/A] Necrotic Tissue: [1:Eschar] [2:Adherent Slough] [N/A:N/A] Exposed Structures: [1:Fat Layer (Subcutaneous Tissue) Exposed: Yes Fascia: No Tendon: No Muscle: No Joint: No Bone: No] [2:Fat Layer (Subcutaneous Tissue) Exposed: Yes Fascia: No Tendon: No Muscle: No Joint: No Bone: No] [N/A:N/A] Epithelialization: [1:Large (67-100%)] [2:None] [N/A:N/A] Periwound Skin Texture: [1:Induration: Yes Excoriation: No] [2:Excoriation: No Induration: No] [N/A:N/A] Callus: No Callus: No Crepitus: No Crepitus: No Rash: No Rash: No Scarring: No Scarring: No Periwound Skin Moisture: Dry/Scaly: Yes Dry/Scaly: Yes N/A Maceration: No Maceration: No Periwound Skin Color: Atrophie Blanche: No Atrophie Blanche: No N/A Cyanosis: No Cyanosis: No Ecchymosis: No Ecchymosis: No Erythema: No Erythema: No Hemosiderin Staining: No Hemosiderin Staining: No Mottled: No Mottled: No Pallor: No Pallor: No Rubor: No Rubor: No Tenderness on Palpation: Yes No N/A Wound Preparation: Ulcer Cleansing: Ulcer Cleansing: N/A Rinsed/Irrigated with Saline Rinsed/Irrigated with Saline Topical Anesthetic Applied: Topical Anesthetic Applied: Other: lidocaine 4% Other: lidocaine 4% Treatment Notes Wound #1 (Right, Medial Lower Leg) 1. Cleansed with: Clean wound with Normal Saline 2. Anesthetic Topical Lidocaine 4% cream to wound bed prior to debridement 5. Secondary Dressing Applied Bordered Foam Dressing Wound #2 (Left, Medial Lower Leg) 1. Cleansed with: Clean wound with Normal Saline 2. Anesthetic Topical Lidocaine 4% cream to wound bed prior to debridement 4. Dressing Applied: Other dressing (specify in notes) 5. Secondary Dressing Applied Bordered  Foam Dressing Notes [employer] Electronic Signature(s) Signed: 12/22/2016 5:01:55 PM By: Linton Ham MD Entered By: Linton Ham on 12/22/2016 16:27:10 [name] ([id number]) -------------------------------------------------------------------------------- [place] Details Patient Name: AYMEN, WIDRIG. Date of Service: 12/22/2016 2:45 PM Medical Record Number: [id number] Patient Account Number: [id number] Date of Birth/Sex: [birth year] (76 y.o. Male) Treating RN: Roger Shelter Primary Care [place]: Leanna Battles Other Clinician: Referring Aubry Tucholski: Leanna Battles Treating Bailei Buist/Extender: Ricard Dillon Weeks in Treatment: 2 Active Inactive ` Peripheral Neuropathy Nursing Diagnoses: Knowledge deficit related to disease process and management of peripheral neurovascular dysfunction Goals: Patient/caregiver will verbalize understanding of disease process and disease management Date Initiated: 12/07/2016 Target Resolution Date: 12/28/2016 Goal Status: Active Interventions: Assess signs and symptoms of neuropathy upon admission and as needed Provide education on Management of Neuropathy and Related Ulcers Notes: ` Wound/Skin Impairment Nursing Diagnoses: Impaired tissue integrity Knowledge deficit related to ulceration/compromised skin integrity Goals: Patient/caregiver will verbalize understanding of skin care regimen Date Initiated: 12/07/2016 Target Resolution Date: 12/28/2016 Goal Status: Active Ulcer/skin breakdown will have a volume reduction of 30% by week 4 Date Initiated: 12/07/2016 Target Resolution Date: 12/28/2016 Goal Status: Active Interventions: Assess patient/caregiver ability to obtain necessary supplies Assess patient/caregiver ability to perform ulcer/skin care regimen  upon admission and as needed Assess ulceration(s) every visit Treatment Activities: Skin care regimen initiated : 12/07/2016 Topical  wound management initiated : 12/07/2016 Notes: SHNEUR, WHITTENBURG ([id number]) Electronic Signature(s) Signed: 12/22/2016 4:37:14 PM By: Roger Shelter Entered By: Roger Shelter on 12/22/2016 15:17:08 [name] ([id number]) -------------------------------------------------------------------------------- Pain Assessment Details Patient Name: LEANORD, THIBEAU. Date of Service: 12/22/2016 2:45 PM Medical Record Number: [id number] Patient Account Number: [id number] Date of Birth/Sex: 07-25-1940 (76 y.o. Male) Treating RN: Roger Shelter Primary Care Peyton Spengler: Leanna Battles Other Clinician: Referring Taven Strite: Leanna Battles Treating Pebbles Zeiders/Extender: Ricard Dillon Weeks in Treatment: 2 Active Problems Location of Pain Severity and Description of Pain Patient Has Paino No Site Locations Pain Management and Medication Current Pain Management: Electronic Signature(s) Signed: 12/24/2016 4:53:17 PM By: Alric Quan Signed: 12/24/2016 5:04:11 PM By: Roger Shelter Previous Signature: 12/22/2016 4:37:14 PM Version By: Roger Shelter Entered By: Alric Quan on 12/24/2016 15:19:29 [name] ([id number]) -------------------------------------------------------------------------------- Patient/Caregiver Education Details Patient Name: JUELZ, WHITTENBERG. Date of Service: 12/22/2016 2:45 PM Medical Record Number: [id number] Patient Account Number: [id number] Date of Birth/Gender: Jul 30, 1940 (76 y.o. Male) Treating RN: Roger Shelter Primary Care Physician: Leanna Battles Other Clinician: Referring Physician: Leanna Battles Treating Physician/Extender: Tito Dine in Treatment: 2 Education Assessment Education Provided To: Patient Education Topics Provided Wound/Skin Impairment: Handouts: Caring for Your Ulcer Methods: Explain/Verbal Responses: State content correctly Electronic Signature(s) Signed: 12/22/2016 4:37:14 PM  By: Roger Shelter Entered By: Roger Shelter on 12/22/2016 15:40:45 [name] ([id number]) -------------------------------------------------------------------------------- Wound Assessment Details Patient Name: JACQUELYN, ANTONY. Date of Service: 12/22/2016 2:45 PM Medical Record Number: [id number] Patient Account Number: [id number] Date of Birth/Sex: Feb 18, 1940 (76 y.o. Male) Treating RN: Roger Shelter Primary Care Keiondra [place]: Leanna Battles Other Clinician: Referring Iyannah Blake: Leanna Battles Treating Marquell Saenz/Extender: Ricard Dillon Weeks in Treatment: 2 Wound Status Wound Number: 1 Primary Diabetic Wound/Ulcer of the Lower Etiology: Extremity Wound Location: Right Lower Leg - Medial Secondary Lymphedema Wounding Event: Gradually Appeared Etiology: Date Acquired: 11/28/2016 Wound Status: Open Weeks Of Treatment: 2 Comorbid Glaucoma, Lymphedema, Hypertension, Clustered Wound: No History: Type II Diabetes, Gout Photos Wound Measurements Length: (cm) 1.2 Width: (cm) 1 Depth: (cm) 0.1 Area: (cm) 0.942 Volume: (cm) 0.094 % Reduction in Area: 46.7% % Reduction in Volume: 46.9% Epithelialization: Large (67-100%) Tunneling: No Undermining: No Wound Description Classification: Grade 1 Foul Odor Wound Margin: Flat and Intact Slough/Fi Exudate Amount: None Present After Cleansing: No brino Yes Wound Bed Granulation Amount: None Present (0%) Exposed Structure Necrotic Amount: Large (67-100%) Fascia Exposed: No Necrotic Quality: Eschar Fat Layer (Subcutaneous Tissue) Exposed: Yes Tendon Exposed: No Muscle Exposed: No Joint Exposed: No Bone Exposed: No Periwound Skin Texture Texture Color No Abnormalities Noted: No No Abnormalities Noted: No Callus: No Atrophie Blanche: No OMAIR, DETTMER ([id number]) Crepitus: No Cyanosis: No Excoriation: No Ecchymosis: No Induration: Yes Erythema: No Rash: No Hemosiderin Staining: No Scarring:  No Mottled: No Pallor: No Moisture Rubor: No No Abnormalities Noted: No Dry / Scaly: Yes Temperature / Pain Maceration: No Tenderness on Palpation: Yes Wound Preparation Ulcer Cleansing: Rinsed/Irrigated with Saline Topical Anesthetic Applied: Other: lidocaine 4%, Electronic Signature(s) Signed: 12/22/2016 4:36:45 PM By: Roger Shelter Entered By: Roger Shelter on 12/22/2016 16:36:44 BENEDICTO, CAPOZZI ([id number]) -------------------------------------------------------------------------------- Wound Assessment Details Patient Name: JOVANNE, RIGGENBACH. Date of Service: 12/22/2016 2:45 PM Medical Record Number: [id number] Patient Account Number: [id number] Date of Birth/Sex: Mar 10, 1940 (75 y.o. Male) Treating RN: Roger Shelter Primary Care Laylaa Guevarra: Leanna Battles Other Clinician: Referring Kennadi [place]: Leanna Battles  Treating Kelis Plasse/Extender: Ricard Dillon Weeks in Treatment: 2 Wound Status Wound Number: 2 Primary Diabetic Wound/Ulcer of the Lower Extremity Etiology: Wound Location: Left Lower Leg - Medial Wound Status: Open Wounding Event: Gradually Appeared Comorbid Glaucoma, Lymphedema, Hypertension, Type Date Acquired: 12/10/2016 History: II Diabetes, Gout Weeks Of Treatment: 1 Clustered Wound: No Photos Wound Measurements Length: (cm) 0.6 % Reduction in Width: (cm) 1.2 % Reduction in Depth: (cm) 0.1 Epithelializat Area: (cm) 0.565 Tunneling: Volume: (cm) 0.057 Undermining: Area: 45.5% Volume: 45.2% ion: None No No Wound Description Classification: Grade 1 Foul Odor Afte Wound Margin: Flat and Intact Slough/Fibrino Exudate Amount: Medium Exudate Type: Serosanguineous Exudate Color: red, brown r Cleansing: No No Wound Bed Granulation Amount: Large (67-100%) Exposed Structure Granulation Quality: Red Fascia Exposed: No Necrotic Amount: Small (1-33%) Fat Layer (Subcutaneous Tissue) Exposed: Yes Necrotic Quality: Adherent  Slough Tendon Exposed: No Muscle Exposed: No Joint Exposed: No Bone Exposed: No Periwound Skin Texture Texture Color FRANSISCO, MESSMER ([id number]) No Abnormalities Noted: No No Abnormalities Noted: No Callus: No Atrophie Blanche: No Crepitus: No Cyanosis: No Excoriation: No Ecchymosis: No Induration: No Erythema: No Rash: No Hemosiderin Staining: No Scarring: No Mottled: No Pallor: No Moisture Rubor: No No Abnormalities Noted: No Dry / Scaly: Yes Maceration: No Wound Preparation Ulcer Cleansing: Rinsed/Irrigated with Saline Topical Anesthetic Applied: Other: lidocaine 4%, Electronic Signature(s) Signed: 12/22/2016 4:37:02 PM By: Roger Shelter Entered By: Roger Shelter on 12/22/2016 16:37:02 [name] ([id number]) -------------------------------------------------------------------------------- [place] Details Patient Name: BRENN, DEZIEL. Date of Service: 12/22/2016 2:45 PM Medical Record Number: [id number] Patient Account Number: [id number] Date of Birth/Sex: Feb 10, 1940 (76 y.o. Male) Treating RN: Roger Shelter Primary Care Linzie Criss: Leanna Battles Other Clinician: Referring Phares Zaccone: Leanna Battles Treating Elajah Kunsman/Extender: Ricard Dillon Weeks in Treatment: 2 Vital Signs Time Taken: 02:58 Temperature (F): 97.7 Height (in): 79 Pulse (bpm): 48 Weight (lbs): 245 Respiratory Rate (breaths/min): 18 Body Mass Index (BMI): 27.6 Blood Pressure (mmHg): 178/61 Reference Range: 80 - 120 mg / dl Electronic Signature(s) Signed: 12/22/2016 4:37:14 PM By: Roger Shelter Entered By: Roger Shelter on 12/22/2016 15:00:58

## 2016-12-26 NOTE — Progress Notes (Addendum)
ROHEN, KIMES ([id number]) Visit Report for 12/24/2016 Arrival Information Details Patient Name: Thomas Mcguire, Thomas Mcguire. Date of Service: 12/24/2016 3:15 PM Medical Record Number: [id number] Patient Account Number: [id number] Date of Birth/Sex: March 14, 1940 (76 y.o. Male) Treating RN: Ahmed Prima Primary Care Ashani Pumphrey: Leanna Battles Other Clinician: Referring Izael Bessinger: Leanna Battles Treating Gradie Ohm/Extender: Frann Rider in Treatment: 2 Visit Information History Since Last Visit All ordered tests and consults were completed: No Patient Arrived: Ambulatory Added or deleted any medications: No Arrival Time: 15:00 Any new allergies or adverse reactions: No Accompanied By: spouse Had a fall or experienced change in No Transfer Assistance: None activities of daily living that may affect Patient Identification Verified: Yes risk of falls: Secondary Verification Process Yes Signs or symptoms of abuse/neglect since last visito No Completed: Hospitalized since last visit: No Patient Requires Transmission-Based No Has Dressing in Place as Prescribed: Yes Precautions: Pain Present Now: No Patient Has Alerts: Yes Patient Alerts: ABI [place] BILATERAL Type II diabetes Electronic Signature(s) Signed: 12/24/2016 4:36:54 PM By: Alric Quan Entered By: Alric Quan on 12/24/2016 16:36:53 [name] ([id number]) -------------------------------------------------------------------------------- Encounter Discharge Information Details Patient Name: Thomas Mcguire, Thomas Mcguire. Date of Service: 12/24/2016 3:15 PM Medical Record Number: [id number] Patient Account Number: [id number] Date of Birth/Sex: 1940-01-25 (76 y.o. Male) Treating RN: Ahmed Prima Primary Care Liyah Higham: Leanna Battles Other Clinician: Referring Braelon Sprung: Leanna Battles Treating Maire Govan/Extender: Frann Rider in Treatment: 2 Encounter Discharge Information Items Discharge Pain Level:  0 Discharge Condition: Stable Ambulatory Status: Ambulatory Discharge Destination: Home Private Transportation: Auto Accompanied By: wife Schedule Follow-up Appointment: Yes Medication Reconciliation completed and No provided to Patient/Care Aliea Bobe: Clinical Summary of Care: Electronic Signature(s) Signed: 12/24/2016 4:39:11 PM By: Alric Quan Entered By: Alric Quan on 12/24/2016 16:39:11 [name] ([id number]) -------------------------------------------------------------------------------- Patient/Caregiver Education Details Patient Name: Thomas Mcguire, Thomas Mcguire. Date of Service: 12/24/2016 3:15 PM Medical Record Number: [id number] Patient Account Number: [id number] Date of Birth/Gender: Aug 14, 1940 (76 y.o. Male) Treating RN: Ahmed Prima Primary Care Physician: Leanna Battles Other Clinician: Referring Physician: Leanna Battles Treating Physician/Extender: Frann Rider in Treatment: 2 Education Assessment Education Provided To: Patient Education Topics Provided Wound/Skin Impairment: Handouts: Other: do not get wrap wet Methods: Demonstration, Explain/Verbal Responses: State content correctly Electronic Signature(s) Signed: 12/24/2016 4:53:17 PM By: Alric Quan Entered By: Alric Quan on 12/24/2016 16:38:52 [name] ([id number]) -------------------------------------------------------------------------------- Wound Assessment Details Patient Name: Thomas Mcguire, Thomas Mcguire. Date of Service: 12/24/2016 3:15 PM Medical Record Number: [id number] Patient Account Number: [id number] Date of Birth/Sex: Jul 24, 1940 (76 y.o. Male) Treating RN: Carolyne Fiscal, Debi Primary Care Isabella Roemmich: Leanna Battles Other Clinician: Referring Ericca Labra: Leanna Battles Treating Tyrin Herbers/Extender: Frann Rider in Treatment: 2 Wound Status Wound Number: 1 Primary Diabetic Wound/Ulcer of the Lower Etiology: Extremity Wound Location: Right Lower  Leg - Medial Secondary Lymphedema Wounding Event: Gradually Appeared Etiology: Date Acquired: 11/28/2016 Wound Status: Open Weeks Of Treatment: 2 Comorbid Glaucoma, Lymphedema, Hypertension, Clustered Wound: No History: Type II Diabetes, Gout Wound Measurements Length: (cm) 1.2 Width: (cm) 1 Depth: (cm) 0.1 Area: (cm) 0.942 Volume: (cm) 0.094 % Reduction in Area: 46.7% % Reduction in Volume: 46.9% Epithelialization: Large (67-100%) Tunneling: No Undermining: No Wound Description Classification: Grade 1 Foul Od Wound Margin: Flat and Intact Slough/ Exudate Amount: None Present or After Cleansing: No Fibrino Yes Wound Bed Granulation Amount: None Present (0%) Exposed Structure Necrotic Amount: Large (67-100%) Fascia Exposed: No Necrotic Quality: Eschar Fat Layer (Subcutaneous Tissue) Exposed: Yes Tendon Exposed: No Muscle Exposed: No Joint Exposed: No Bone Exposed: No Periwound Skin  Texture Texture Color No Abnormalities Noted: No No Abnormalities Noted: No Callus: No Atrophie Blanche: No Crepitus: No Cyanosis: No Excoriation: No Ecchymosis: No Induration: Yes Erythema: No Rash: No Hemosiderin Staining: No Scarring: No Mottled: No Pallor: No Moisture Rubor: No No Abnormalities Noted: No Dry / Scaly: Yes Temperature / Pain Maceration: No Tenderness on Palpation: Yes Wound Preparation Thomas Mcguire, Thomas Mcguire ([id number]) Ulcer Cleansing: Rinsed/Irrigated with Saline Topical Anesthetic Applied: None Treatment Notes Wound #1 (Right, Medial Lower Leg) 1. Cleansed with: Clean wound with Normal Saline 4. Dressing Applied: Other dressing (specify in notes) 7. Secured with Tape 3 Layer Compression System - Right Lower Extremity Notes [facility]cel Electronic Signature(s) Signed: 12/24/2016 4:37:25 PM By: Alric Quan Entered By: Alric Quan on 12/24/2016 16:37:24 [name]  ([id number]) -------------------------------------------------------------------------------- Wound Assessment Details Patient Name: Thomas Mcguire, Thomas Mcguire. Date of Service: 12/24/2016 3:15 PM Medical Record Number: [id number] Patient Account Number: [id number] Date of Birth/Sex: 1940/09/11 (76 y.o. Male) Treating RN: Carolyne Fiscal, Debi Primary Care Delesa Kawa: Leanna Battles Other Clinician: Referring Augusten Lipkin: Leanna Battles Treating Tishina Lown/Extender: Frann Rider in Treatment: 2 Wound Status Wound Number: 2 Primary Diabetic Wound/Ulcer of the Lower Extremity Etiology: Wound Location: Left Lower Leg - Medial Wound Status: Open Wounding Event: Gradually Appeared Comorbid Glaucoma, Lymphedema, Hypertension, Date Acquired: 12/10/2016 History: Type II Diabetes, Gout Weeks Of Treatment: 1 Clustered Wound: No Wound Measurements Length: (cm) 0.6 Width: (cm) 1.2 Depth: (cm) 0.1 Area: (cm) 0.565 Volume: (cm) 0.057 % Reduction in Area: 45.5% % Reduction in Volume: 45.2% Epithelialization: None Tunneling: No Undermining: No Wound Description Classification: Grade 1 Wound Margin: Flat and Intact Exudate Amount: Medium Exudate Type: Serosanguineous Exudate Color: red, brown Foul Odor After Cleansing: No Slough/Fibrino No Wound Bed Granulation Amount: Large (67-100%) Exposed Structure Granulation Quality: Red Fascia Exposed: No Necrotic Amount: Small (1-33%) Fat Layer (Subcutaneous Tissue) Exposed: Yes Necrotic Quality: Adherent Slough Tendon Exposed: No Muscle Exposed: No Joint Exposed: No Bone Exposed: No Periwound Skin Texture Texture Color No Abnormalities Noted: No No Abnormalities Noted: No Callus: No Atrophie Blanche: No Crepitus: No Cyanosis: No Excoriation: No Ecchymosis: No Induration: No Erythema: No Rash: No Hemosiderin Staining: No Scarring: No Mottled: No Pallor: No Moisture Rubor: No No Abnormalities Noted: No Dry / Scaly:  Yes Maceration: No ERASMO, Thomas Mcguire ([id number]) Wound Preparation Ulcer Cleansing: Rinsed/Irrigated with Saline Topical Anesthetic Applied: None Treatment Notes Wound #2 (Left, Medial Lower Leg) 1. Cleansed with: Clean wound with Normal Saline 4. Dressing Applied: Other dressing (specify in notes) 5. Secondary Dressing Applied Bordered Foam Dressing Notes [employer] Electronic Signature(s) Signed: 12/24/2016 4:37:38 PM By: Alric Quan Entered By: Alric Quan on 12/24/2016 16:37:37

## 2016-12-27 ENCOUNTER — Ambulatory Visit: Payer: Medicare Other | Admitting: Surgery

## 2016-12-27 ENCOUNTER — Encounter: Payer: Self-pay | Admitting: Surgery

## 2016-12-27 VITALS — BP 166/76 | HR 52 | Temp 98.4°F | Resp 20 | Ht 78.0 in | Wt 246.9 lb

## 2016-12-27 DIAGNOSIS — I7025 Atherosclerosis of native arteries of other extremities with ulceration: Secondary | ICD-10-CM | POA: Diagnosis not present

## 2016-12-27 NOTE — Progress Notes (Signed)
[facility] [place]  Patient name: Thomas Mcguire MRN: [id number] DOB: [birth year] Sex: male   REQUESTING PROVIDER:    Dr. Dellia Nims   REASON FOR CONSULT:    Left leg ulcer  HISTORY OF PRESENT ILLNESS:   Thomas Mcguire is a 76 y.o. male, who is referred today for evaluation of ulcers on both feet.  The patient states that have been present for approximately 1 month but that are getting better and that the right one has almost healed.  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  The patient does suffer from diabetes as well as hypercholesterolemia.  He does take a statin.  He is a former smoker.  The patient has seen Dr. Scot Dock in our office in the remote past for claudication.  PAST MEDICAL HISTORY    Past Medical History:  Diagnosis Date  . Arthritis   . Chronic renal insufficiency    CHECKED Q3MOS PER PT. NO TREATMENT  . Diabetes mellitus   . Gout    Has not had recently- 08/24/11  . Hyperlipidemia   . Hypertension   . Neuromuscular disorder ([place])    Neuropathy     FAMILY HISTORY   Family History  Problem Relation Age of Onset  . Colon cancer Neg Hx     SOCIAL HISTORY:   Social History   Socioeconomic History  . Marital status: Married    Spouse name: Not on file  . Number of children: Not on file  . Years of education: Not on file  . Highest education level: Not on file  Social Needs  . Financial resource strain: Not on file  . Food insecurity - worry: Not on file  . Food insecurity - inability: Not on file  . Transportation needs - medical: Not on file  . Transportation needs - non-medical: Not on file  Occupational History  . Not on file  Tobacco Use  . Smoking status: Former Smoker    Last attempt to quit: 03/11/1996    Years since quitting: 20.8  . Smokeless tobacco: Never Used  Substance and Sexual Activity  . Alcohol use: No  . Drug use: No  .  Sexual activity: Not on file  Other Topics Concern  . Not on file  Social History Narrative  . Not on file    ALLERGIES:    No Known Allergies  CURRENT MEDICATIONS:    Current Outpatient Medications  Medication Sig Dispense Refill  . allopurinol (ZYLOPRIM) 300 MG tablet Take 300 mg by mouth daily.      Marland Kitchen aspirin 325 MG tablet Take 325 mg by mouth daily.      . carvedilol (COREG) 25 MG tablet Take 25 mg by mouth 2 (two) times daily with a meal.    . COD LIVER OIL PO Take 1 capsule by mouth daily.     Marland Kitchen doxazosin (CARDURA) 2 MG tablet Take 2 mg by mouth at bedtime.    . furosemide (LASIX) 40 MG tablet Take 80 mg by mouth daily.     Marland Kitchen guanFACINE (TENEX) 2 MG tablet Take 2 mg by mouth every morning.     Marland Kitchen HYDROcodone-acetaminophen (VICODIN) 5-500 MG per tablet Take 1-2 tablets by mouth daily as needed. For pain    . insulin NPH (HUMULIN N,NOVOLIN N) 100 UNIT/ML injection Inject 50 Units into the skin 2 (two) times daily. Inject 50 units in am and 50 units in  pm    . insulin regular (HUMULIN R,NOVOLIN R) 100 units/mL injection Inject 22 Units into the skin 3 (three) times daily before meals.     Marland Kitchen lisinopril (PRINIVIL,ZESTRIL) 20 MG tablet Take 10 mg by mouth 2 (two) times daily.     . pravastatin (PRAVACHOL) 40 MG tablet Take 40 mg by mouth daily.       No current facility-administered medications for this visit.     REVIEW OF SYSTEMS:   [X]  denotes positive finding, [ ]  denotes negative finding Cardiac  Comments:  Chest pain or chest pressure:    Shortness of breath upon exertion:    Short of breath when lying flat:    Irregular heart rhythm:        Vascular    Pain in calf, thigh, or hip brought on by ambulation:    Pain in feet at night that wakes you up from your sleep:     Blood clot in your veins:    Leg swelling:         Pulmonary    Oxygen at home:    Productive cough:     Wheezing:         Neurologic    Sudden weakness in arms or legs:     Sudden numbness  in arms or legs:     Sudden onset of difficulty speaking or slurred speech:    Temporary loss of vision in one eye:     Problems with dizziness:         Gastrointestinal    Blood in stool:      Vomited blood:         Genitourinary    Burning when urinating:     Blood in urine:        Psychiatric    Major depression:         Hematologic    Bleeding problems:    Problems with blood clotting too easily:        Skin    Rashes or ulcers:        Constitutional    Fever or chills:     PHYSICAL EXAM:   Vitals:   12/27/16 1312  BP: (!) 166/76  Pulse: (!) 52  Resp: 20  Temp: 98.4 F (36.9 C)  TempSrc: Oral  SpO2: 97%  Weight: 246 lb 14.4 oz (112 kg)  Height: 6\' 6"  (1.981 m)    GENERAL: The patient is a well-nourished male, in no acute distress. The vital signs are documented above. CARDIAC: There is a regular rate and rhythm.  VASCULAR: Nonpalpable pedal pulses PULMONARY: Nonlabored respirations MUSCULOSKELETAL: There are no major deformities or cyanosis. NEUROLOGIC: No focal weakness or paresthesias are detected. SKIN: Healed wound on right shin.  On the left shin there is a 2 x 1 cm wound.  The patient states that this had a scab on it which came off when the dressing was removed today. PSYCHIATRIC: The patient has a normal affect.  STUDIES:   I have reviewed his vascular lab studies.  The ABI is 0.99 with monophasic waveforms and a toe pressure of 47 on the right.  On the left the ABI is 0.68 with monophasic waveforms and no calculated toe pressure.  Tandem lesions are seen in the right superficial femoral artery.  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  I have decided to have him come back in 3 weeks for a wound check.  If the wound has not completely healed I would plan on proceeding with  angiography to better define the anatomy of both legs and intervene on the left if possible.  The patient understands that if the wound gets worse or if he develops an infection that he needs to contact me sooner.  Otherwise I will see him back in 3 weeks.   Annamarie Major, MD [facility] [phone] Pager [phone]

## 2016-12-28 ENCOUNTER — Encounter: Payer: Medicare Other | Admitting: Internal Medicine

## 2016-12-28 DIAGNOSIS — E11622 Type 2 diabetes mellitus with other skin ulcer: Secondary | ICD-10-CM | POA: Diagnosis not present

## 2016-12-30 NOTE — Progress Notes (Addendum)
TERRIS, GERMANO ([id number]) Visit Report for 12/28/2016 HPI Details Patient Name: Thomas Mcguire, Thomas Mcguire. Date of Service: 12/28/2016 8:45 AM Medical Record Number: [id number] Patient Account Number: [id number] Date of Birth/Sex: [birth year] (76 y.o. 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 He is here for review of wound on the right lower leg. 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 He has chronic edema in his lower legs and uses 20-30 mm compression stockings which she is compliant with. He is a nonsmoker. Beside his diabetes he has hypertension and hypercholesterolemia and in 2013 he had stage III chronic renal failure. He tells me his primary physician Dr. Valetta Fuller at [facility] follows him every 3 months for his kidney failure. He also states he saw a nephrologist once. ABIs in this clinic were noncompressible. He tells me he saw Dr. Doren Custard in 6 or 7 years ago at [facility] in [place]. He does not remember what the issue was. It would appear that the patient has had recurrent blistering in the lower extremities as there are several healed areas on the left anterior and right anterior calves 12/14/16 on evaluation today patient appears to be doing very well with the current three layer compression wraps. His right lower extremity is significantly improved as far as the swelling is concerned. He has been tolerating the wraps without any complication. The wounds also appear to be doing better compared to last week's evaluation. Unfortunately he does have a new dramatic wound to the left interior shin but  otherwise things are looking up. No fevers chills noted. Patient does have a appointment with [facility] [place] on December 23, 2016 four arterial studies. 12/22/16 area x2 on right leg are closed. Still one open area on left leg. arterial studies tomorrow 12/28/16; 2 wounds on the right leg are closed. He still has one open area on the left leg. ARTERIAL studies were done on 12/23/16 D showed an ABI on the right of 0.99 and on the left at 0.68. His toe brachial index was 0.26 on the right. Monophasic waveforms at both the posterior tibial and dorsalis pedis arteries. The patient saw Dr. Trula Slade of vascular surgery yesterday. He stated he would follow him for the remaining wound on the left leg. He has been using silver alginate Electronic Signature(s) Signed: 12/29/2016 8:14:14 AM By: Linton Ham MD Entered By: Linton Ham on 12/28/2016 09:20:15 [name] ([id number]) -------------------------------------------------------------------------------- Physical Exam Details Patient Name: Thomas Mcguire. Date of Service: 12/28/2016 8:45 AM Medical Record Number: [id number] Patient Account Number: [id number] Date of Birth/Sex: Jul 24, 1940 (76 y.o. Male) Treating RN: Ahmed Prima Primary Care Provider: Leanna Battles Other Clinician: Referring Provider: Leanna Battles Treating Provider/Extender: Ricard Dillon Weeks in Treatment: 3 Constitutional Patient is hypertensive.. Pulse regular and within target range for patient.Marland Kitchen Respirations regular, non-labored and within target range.. Temperature is normal and within the target range for the patient.Marland Kitchen appears in no distress. Eyes Conjunctivae clear. No discharge. Respiratory Respiratory effort is easy and symmetric bilaterally. Rate is normal at rest and on room air.. Cardiovascular Faint dorsalis pedis pulses bilaterally. Edema present in both extremities. This is 1-2+.. Integumentary (Hair,  Skin) Changes of chronic  venous insufficiency. Psychiatric No evidence of depression, anxiety, or agitation. Calm, cooperative, and communicative. Appropriate interactions and affect.. Notes Wound exam; the area in question is on the left anterior tibial area. Reasonably healthy-looking wound although I don't have a great sense of the dimensions here which apparently were not done last week. There is no evidence of surrounding infection. He is using TED hose Electronic Signature(s) Signed: 12/29/2016 8:14:14 AM By: Linton Ham MD Entered By: Linton Ham on 12/28/2016 55:37:48 [name] ([id number]) -------------------------------------------------------------------------------- Physician Orders Details Patient Name: Thomas Mcguire. Date of Service: 12/28/2016 8:45 AM Medical Record Number: [id number] Patient Account Number: [id number] Date of Birth/Sex: [birth year] (76 y.o. Male) Treating RN: Carolyne Fiscal, Debi Primary Care Provider: Leanna Battles Other Clinician: Referring Provider: Leanna Battles Treating Provider/Extender: Tito Dine in Treatment: 3 Verbal / Phone Orders: Yes Clinician: Pinkerton, Debi Read Back and Verified: Yes Diagnosis Coding Wound Cleansing Wound #2 Left,Medial Lower Leg o Clean wound with Normal Saline. Anesthetic (add to Medication List) Wound #2 Left,Medial Lower Leg o Topical Lidocaine 4% cream applied to wound bed prior to debridement (In Clinic Only). Primary Wound Dressing Wound #2 Left,Medial Lower Leg o Silvercel Non-Adherent Secondary Dressing Wound #2 Left,Medial Lower Leg o Boardered Foam Dressing Dressing Change Frequency Wound #2 Left,Medial Lower Leg o Change dressing every other day. Follow-up Appointments o Return Appointment in 2 weeks. Edema Control o Patient to wear own compression stockings o Elevate legs to the level of the heart and pump ankles as often as possible o  Other: - order dual layer compression stockings Additional Orders / Instructions Wound #2 Left,Medial Lower Leg o Vitamin A; Vitamin C, Zinc o Increase protein intake. Patient Medications Allergies: No Known Drug Allergies Notifications Medication Indication Start End lidocaine DOSE 1 - topical 4 % cream - 1 cream topical CATO, LIBURD ([id number]) Electronic Signature(s) Signed: 12/28/2016 4:52:40 PM By: Alric Quan Signed: 12/29/2016 8:14:14 AM By: Linton Ham MD Entered By: Alric Quan on 12/28/2016 09:31:45 VASILIS, LUHMAN ([id number]) -------------------------------------------------------------------------------- Prescription 12/28/2016 Patient Name: AHKEEM, GOEDE. Provider: Ricard Dillon MD Date of Birth: 1940-05-31 NPI#: [id number] Sex: Jerilynn Mages DEA#: [id number] Phone #: [phone] License #: [id number] Patient Address: [place] [facility] [place], [place] [address] [address] [place], [place] [address] [phone] Allergies No Known Drug Allergies Medication Medication: Route: Strength: Form: lidocaine topical 4% cream Class: TOPICAL LOCAL ANESTHETICS Dose: Frequency / Time: Indication: 1 1 cream topical Number of Refills: Number of Units: 0 Generic Substitution: Start Date: End Date: Administered at [facility]: No Note to Pharmacy: Signature(s): Date(s): Electronic Signature(s) Signed: 12/28/2016 4:52:40 PM By: Alric Quan Signed: 12/29/2016 8:14:14 AM By: Linton Ham MD Entered By: Alric Quan on 12/28/2016 09:31:45 [name] ([id number]) --------------------------------------------------------------------------------  Problem List Details Patient Name: KINCAID, TIGER. Date of Service: 12/28/2016 8:45 AM Medical Record Number: [id number] Patient Account Number: [id number] Date  of Birth/Sex: 08/29/1940 (76 y.o. Male) Treating RN: Ahmed Prima Primary Care Provider: Leanna Battles Other Clinician: Referring Provider: Leanna Battles Treating Provider/Extender: Ricard Dillon Weeks in Treatment: 3 Active Problems ICD-10 Encounter Code Description Active Date Diagnosis L97.211 Non-pressure chronic ulcer of right calf limited to breakdown of skin 12/07/2016 Yes E11.622 Type 2 diabetes mellitus with other skin ulcer 12/07/2016 Yes I87.311 Chronic venous hypertension (idiopathic) with ulcer of right lower 12/07/2016 Yes extremity E11.42 Type 2 diabetes mellitus with diabetic polyneuropathy 12/07/2016 Yes L97.221 Non-pressure chronic  ulcer of left calf limited to breakdown of skin 12/22/2016 Yes Inactive Problems Resolved Problems Electronic Signature(s) Signed: 12/29/2016 8:14:14 AM By: Linton Ham MD Entered By: Linton Ham on 12/28/2016 09:16:56 [name] ([id number]) -------------------------------------------------------------------------------- Progress Note Details Patient Name: OVID, WITMAN. Date of Service: 12/28/2016 8:45 AM Medical Record Number: [id number] Patient Account Number: [id number] Date of Birth/Sex: 1940/04/08 (76 y.o. 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 He is here for review of wound on the right lower leg. 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 He has chronic edema in his lower legs and uses 20-30 mm compression stockings which she is compliant with. He is a nonsmoker. Beside his diabetes he has hypertension and hypercholesterolemia and in 2013 he  had stage III chronic renal failure. He tells me his primary physician Dr. Valetta Fuller at [facility] follows him every 3 months for his kidney failure. He also states he saw a nephrologist once. ABIs in this clinic were noncompressible. He tells me he saw Dr. Doren Custard in 6 or 7 years ago at [facility] in [place]. He does not remember what the issue was. It would appear that the patient has had recurrent blistering in the lower extremities as there are several healed areas on the left anterior and right anterior calves 12/14/16 on evaluation today patient appears to be doing very well with the current three layer compression wraps. His right lower extremity is significantly improved as far as the swelling is concerned. He has been tolerating the wraps without any complication. The wounds also appear to be doing better compared to last week's evaluation. Unfortunately he does have a new dramatic wound to the left interior shin but otherwise things are looking up. No fevers chills noted. Patient does have a appointment with [facility] on December 23, 2016 four arterial studies. 12/22/16 area x2 on right leg are closed. Still one open area on left leg. arterial studies tomorrow 12/28/16; 2 wounds on the right leg are closed. He still has one open area on the left leg. ARTERIAL studies were done on 12/23/16 D showed an ABI on the right of 0.99 and on the left at 0.68. His toe brachial index was 0.26 on the right. Monophasic waveforms at both the posterior tibial and dorsalis pedis arteries. The patient saw Dr. Trula Slade of vascular surgery yesterday. He stated he would follow him for the remaining wound on the left leg. He has been using silver alginate Objective Constitutional Patient is hypertensive.. Pulse regular and within target range for patient.Marland Kitchen Respirations regular, non-labored and within target range.. Temperature is normal and within the target  range for the patient.Marland Kitchen appears in no distress. Vitals Time Taken: 8:44 AM, Height: 79 in, Weight: 245 lbs, BMI: 27.6, Temperature: 97.9 F, Pulse: 55 bpm, Respiratory Rate: 18 breaths/min, Blood Pressure: 179/62 mmHg. Eyes Conjunctivae clear. No discharge. Respiratory Respiratory effort is easy and symmetric bilaterally. Rate is normal at rest and on room air.[place] ([id number]) Cardiovascular Faint dorsalis pedis pulses bilaterally. Edema present in both extremities. This is 1-2+.Marland Kitchen Psychiatric No evidence of depression, anxiety, or agitation. Calm, cooperative, and communicative. Appropriate interactions and affect.. General Notes: Wound  exam; the area in question is on the left anterior tibial area. Reasonably healthy-looking wound although I don't have a great sense of the dimensions here which apparently were not done last week. There is no evidence of surrounding infection. He is using TED hose Integumentary (Hair, Skin) Changes of chronic venous insufficiency. Wound #1 status is Healed - Epithelialized. Original cause of wound was Gradually Appeared. The wound is located on the Right,Medial Lower Leg. The wound measures 0cm length x 0cm width x 0cm depth; 0cm^2 area and 0cm^3 volume. There is Fat Layer (Subcutaneous Tissue) Exposed exposed. There is no tunneling or undermining noted. There is a none present amount of drainage noted. The wound margin is flat and intact. There is no granulation within the wound bed. There is no necrotic tissue within the wound bed. The periwound skin appearance exhibited: Dry/Scaly. The periwound skin appearance did not exhibit: Callus, Crepitus, Excoriation, Induration, Rash, Scarring, Maceration, Atrophie Blanche, Cyanosis, Ecchymosis, Hemosiderin Staining, Mottled, Pallor, Rubor, Erythema. The periwound has tenderness on palpation. Wound #2 status is Open. Original cause of wound was Gradually Appeared. The wound is located on the  Left,Medial Lower Leg. The wound measures 0.8cm length x 1.8cm width x 0.1cm depth; 1.131cm^2 area and 0.113cm^3 volume. There is Fat Layer (Subcutaneous Tissue) Exposed exposed. There is no tunneling or undermining noted. There is a large amount of serosanguineous drainage noted. The wound margin is flat and intact. There is large (67-100%) red granulation within the wound bed. There is a small (1-33%) amount of necrotic tissue within the wound bed including Adherent Slough. The periwound skin appearance exhibited: Maceration. The periwound skin appearance did not exhibit: Callus, Crepitus, Excoriation, Induration, Rash, Scarring, Dry/Scaly, Atrophie Blanche, Cyanosis, Ecchymosis, Hemosiderin Staining, Mottled, Pallor, Rubor, Erythema. Periwound temperature was noted as No Abnormality. The periwound has tenderness on palpation. Assessment Active Problems ICD-10 L97.211 - Non-pressure chronic ulcer of right calf limited to breakdown of skin E11.622 - Type 2 diabetes mellitus with other skin ulcer I87.311 - Chronic venous hypertension (idiopathic) with ulcer of right lower extremity E11.42 - Type 2 diabetes mellitus with diabetic polyneuropathy L97.221 - Non-pressure chronic ulcer of left calf limited to breakdown of skin Plan Wound Cleansing: Wound #2 Left,Medial Lower Leg: Clean wound with Normal Saline. Anesthetic (add to Medication List): Wound #2 Left,Medial Lower Leg: YANDIEL, BERGUM ([id number]) Topical Lidocaine 4% cream applied to wound bed prior to debridement (In Clinic Only). Primary Wound Dressing: Wound #2 Left,Medial Lower Leg: Silvercel Non-Adherent Secondary Dressing: Wound #2 Left,Medial Lower Leg: Boardered Foam Dressing Dressing Change Frequency: Wound #2 Left,Medial Lower Leg: Change dressing every other day. Follow-up Appointments: Return Appointment in 2 weeks. Edema Control: Patient to wear own compression stockings Elevate legs to the level of the heart  and pump ankles as often as possible Other: - order dual layer compression stockings Additional Orders / Instructions: Wound #2 Left,Medial Lower Leg: Vitamin A; Vitamin C, Zinc Increase protein intake. The following medication(s) was prescribed: lidocaine topical 4 % cream 1 1 cream topical o #1 the patient saw Dr. Trula Slade yesterday. According to patient he feels he has a distal femoral obstruction which is mild and that he would follow him in 4 weeks. The patient does not have any suggestion of claudication still walks on a treadmill and uses an exercise bike without limitation #2 he also has chronic venous insufficiency and this may actually have more to do with the wounds then his arterial limitations. He does not have good edema control #3  where using silver alginate border foam and is on TED stockings although we will order him more compression stockings #4 follow-up in 2 weeks Electronic Signature(s) Signed: 12/31/2016 10:56:38 AM By: Gretta Cool, BSN, RN, CWS, Kim RN, BSN Signed: 01/02/2017 7:28:22 AM By: Linton Ham MD Previous Signature: 12/29/2016 8:14:14 AM Version By: Linton Ham MD Entered By: Gretta Cool, BSN, RN, CWS, Kim on 12/31/2016 10:56:38 JEDAIAH, RATHBUN ([id number]) -------------------------------------------------------------------------------- Welcome Details Patient Name: HALLIE, ISHIDA. Date of Service: 12/28/2016 Medical Record Number: [id number] Patient Account Number: [id number] Date of Birth/Sex: [birth year] (76 y.o. Male) Treating RN: Ahmed Prima Primary Care Provider: Leanna Battles Other Clinician: Referring Provider: Leanna Battles Treating Provider/Extender: Ricard Dillon Weeks in Treatment: 3 Diagnosis Coding ICD-10 Codes Code Description X09.407 Non-pressure chronic ulcer of right calf limited to breakdown of skin E11.622 Type 2 diabetes mellitus with other skin ulcer I87.311 Chronic venous hypertension (idiopathic) with ulcer  of right lower extremity E11.42 Type 2 diabetes mellitus with diabetic polyneuropathy L97.221 Non-pressure chronic ulcer of left calf limited to breakdown of skin Facility Procedures CPT4 Code: 68088110 Description: [place] VISIT-LEV 3 EST PT Modifier: Quantity: 1 Physician Procedures CPT4 Code: 3159458 Description: 59292 - WC PHYS LEVEL 3 - EST PT ICD-10 Diagnosis Description L97.221 Non-pressure chronic ulcer of left calf limited to breakdown E11.622 Type 2 diabetes mellitus with other skin ulcer Modifier: of skin Quantity: 1 Electronic Signature(s) Signed: 12/28/2016 9:32:31 AM By: Alric Quan Signed: 12/29/2016 8:14:14 AM By: Linton Ham MD Entered By: Alric Quan on 12/28/2016 09:32:30

## 2016-12-30 NOTE — Progress Notes (Signed)
Thomas Mcguire, Thomas Mcguire ([id number]) Visit Report for 12/28/2016 Arrival Information Details Patient Name: Thomas Mcguire, Thomas Mcguire. Date of Service: 12/28/2016 8:45 AM Medical Record Number: [id number] Patient Account Number: [id number] Date of Birth/Sex: 1940/02/12 (76 y.o. Male) Treating RN: Ahmed Prima Primary Care Zong Mcquarrie: Leanna Battles Other Clinician: Referring Kailea Dannemiller: Leanna Battles Treating [place] Kappes/Extender: Tito Dine in Treatment: 3 Visit Information History Since Last Visit All ordered tests and consults were completed: No Patient Arrived: Ambulatory Added or deleted any medications: No Arrival Time: 08:44 Any new allergies or adverse reactions: No Accompanied By: self Had a fall or experienced change in No Transfer Assistance: None activities of daily living that may affect Patient Identification Verified: Yes risk of falls: Secondary Verification Process Yes Signs or symptoms of abuse/neglect since last visito No Completed: Hospitalized since last visit: No Patient Requires Transmission-Based No Has Dressing in Place as Prescribed: Yes Precautions: Has Compression in Place as Prescribed: Yes Patient Has Alerts: Yes Pain Present Now: No Patient Alerts: ABI [place] BILATERAL Type II diabetes Electronic Signature(s) Signed: 12/28/2016 4:52:40 PM By: Alric Quan Entered By: Alric Quan on 12/28/2016 08:44:29 Thomas Mcguire ([id number]) -------------------------------------------------------------------------------- Clinic Level of Care Assessment Details Patient Name: Thomas Mcguire. Date of Service: 12/28/2016 8:45 AM Medical Record Number: [id number] Patient Account Number: [id number] Date of Birth/Sex: 1940-07-16 (76 y.o. Male) Treating RN: Carolyne Fiscal, Debi Primary Care Kearstin Learn: Leanna Battles Other Clinician: Referring Emara Lichter: Leanna Battles Treating Jcion Buddenhagen/Extender: Tito Dine in Treatment: 3 Clinic Level  of Care Assessment Items TOOL 4 Quantity Score X - Use when only an EandM is performed on FOLLOW-UP visit 1 0 ASSESSMENTS - Nursing Assessment / Reassessment X - Reassessment of Co-morbidities (includes updates in patient status) 1 10 X- 1 5 Reassessment of Adherence to Treatment Plan ASSESSMENTS - Wound and Skin Assessment / Reassessment []  - Simple Wound Assessment / Reassessment - one wound 0 X- 2 5 Complex Wound Assessment / Reassessment - multiple wounds []  - 0 Dermatologic / Skin Assessment (not related to wound area) ASSESSMENTS - Focused Assessment []  - Circumferential Edema Measurements - multi extremities 0 []  - 0 Nutritional Assessment / Counseling / Intervention []  - 0 Lower Extremity Assessment (monofilament, tuning fork, pulses) []  - 0 Peripheral Arterial Disease Assessment (using hand held doppler) ASSESSMENTS - Ostomy and/or Continence Assessment and Care []  - Incontinence Assessment and Management 0 []  - 0 Ostomy Care Assessment and Management (repouching, etc.) PROCESS - Coordination of Care X - Simple Patient / Family Education for ongoing care 1 15 []  - 0 Complex (extensive) Patient / Family Education for ongoing care []  - 0 Staff obtains Programmer, systems, Records, Test Results / Process Orders []  - 0 Staff telephones HHA, Nursing Homes / Clarify orders / etc []  - 0 Routine Transfer to another Facility (non-emergent condition) []  - 0 Routine Hospital Admission (non-emergent condition) []  - 0 New Admissions / Biomedical engineer / Ordering NPWT, Apligraf, etc. []  - 0 Emergency Hospital Admission (emergent condition) X- 1 10 Simple Discharge Coordination Thomas Mcguire, Thomas Mcguire ([id number]) []  - 0 Complex (extensive) Discharge Coordination PROCESS - Special Needs []  - Pediatric / Minor Patient Management 0 []  - 0 Isolation Patient Management []  - 0 Hearing / Language / Visual special needs []  - 0 Assessment of Community assistance (transportation, D/C  planning, etc.) []  - 0 Additional assistance / Altered mentation []  - 0 Support Surface(s) Assessment (bed, cushion, seat, etc.) INTERVENTIONS - Wound Cleansing / Measurement []  - Simple Wound Cleansing - one wound 0 X- 2  5 Complex Wound Cleansing - multiple wounds X- 1 5 Wound Imaging (photographs - any number of wounds) []  - 0 Wound Tracing (instead of photographs) X- 1 5 Simple Wound Measurement - one wound []  - 0 Complex Wound Measurement - multiple wounds INTERVENTIONS - Wound Dressings X - Small Wound Dressing one or multiple wounds 1 10 []  - 0 Medium Wound Dressing one or multiple wounds []  - 0 Large Wound Dressing one or multiple wounds X- 1 5 Application of Medications - topical []  - 0 Application of Medications - injection INTERVENTIONS - Miscellaneous []  - External ear exam 0 []  - 0 Specimen Collection (cultures, biopsies, blood, body fluids, etc.) []  - 0 Specimen(s) / Culture(s) sent or taken to Lab for analysis []  - 0 Patient Transfer (multiple staff / Civil Service fast streamer / Similar devices) []  - 0 Simple Staple / Suture removal (25 or less) []  - 0 Complex Staple / Suture removal (26 or more) []  - 0 Hypo / Hyperglycemic Management (close monitor of Blood Glucose) []  - 0 Ankle / Brachial Index (ABI) - do not check if billed separately X- 1 5 Vital Signs Thomas Mcguire, Thomas Mcguire ([id number]) Has the patient been seen at the hospital within the last three years: Yes Total Score: 90 Level Of Care: New/Established - Level 3 Electronic Signature(s) Signed: 12/28/2016 4:52:40 PM By: Alric Quan Entered By: Alric Quan on 12/28/2016 09:32:21 Thomas Mcguire ([id number]) -------------------------------------------------------------------------------- Encounter Discharge Information Details Patient Name: Thomas Mcguire, Thomas Mcguire. Date of Service: 12/28/2016 8:45 AM Medical Record Number: [id number] Patient Account Number: [id number] Date of Birth/Sex: Apr 04, 1940 (76  y.o. Male) Treating RN: Ahmed Prima Primary Care Rhonda Linan: Leanna Battles Other Clinician: Referring Shaft Corigliano: Leanna Battles Treating Jahdiel Krol/Extender: Tito Dine in Treatment: 3 Encounter Discharge Information Items Discharge Pain Level: 0 Discharge Condition: Stable Ambulatory Status: Ambulatory Discharge Destination: Home Transportation: Private Auto Accompanied By: self Schedule Follow-up Appointment: Yes Medication Reconciliation completed and No provided to Patient/Care Collyn Selk: Provided on Clinical Summary of Care: 12/28/2016 Form Type Recipient Paper Patient TA Electronic Signature(s) Signed: 12/28/2016 11:15:46 AM By: Ruthine Dose Entered By: Ruthine Dose on 12/28/2016 09:24:21 Thomas Mcguire ([id number]) -------------------------------------------------------------------------------- Lower Extremity Assessment Details Patient Name: Thomas Mcguire, Thomas Mcguire. Date of Service: 12/28/2016 8:45 AM Medical Record Number: [id number] Patient Account Number: [id number] Date of Birth/Sex: 08-Dec-1940 (76 y.o. Male) Treating RN: Carolyne Fiscal, Debi Primary Care Ameah Chanda: Leanna Battles Other Clinician: Referring Virdell Hoiland: Leanna Battles Treating Tyvon Eggenberger/Extender: Ricard Dillon Weeks in Treatment: 3 Edema Assessment Assessed: [Left: No] [Right: No] Edema: [Left: Ye] [Right: s] Calf Left: Right: Point of Measurement: 38 cm From Medial Instep 42.9 cm cm Ankle Left: Right: Point of Measurement: 13 cm From Medial Instep 25.5 cm cm Vascular Assessment Pulses: Dorsalis Pedis Palpable: [Left:Yes] [Right:Yes] Posterior Tibial Extremity colors, hair growth, and conditions: Extremity Color: [Left:Normal] [Right:Normal] Temperature of Extremity: [Left:Cool] [Right:Cool] Capillary Refill: [Left:> 3 seconds] [Right:< 3 seconds] Toe Nail Assessment Left: Right: Thick: No No Discolored: No No Deformed: Yes Yes Improper Length and Hygiene: Yes  Yes Notes heel to knee 50cm Electronic Signature(s) Signed: 12/28/2016 4:52:40 PM By: Alric Quan Entered By: Alric Quan on 12/28/2016 09:14:04 Thomas Mcguire ([id number]) -------------------------------------------------------------------------------- Multi Wound Chart Details Patient Name: Thomas Mcguire. Date of Service: 12/28/2016 8:45 AM Medical Record Number: [id number] Patient Account Number: [id number] Date of Birth/Sex: 06/16/1940 (76 y.o. Male) Treating RN: Ahmed Prima Primary Care Jazmyn Offner: Leanna Battles Other Clinician: Referring Mari Battaglia: Leanna Battles Treating Cesareo Vickrey/Extender: Ricard Dillon Weeks in Treatment: 3 Vital Signs Height(in):  79 Pulse(bpm): 55 Weight(lbs): 245 Blood Pressure(mmHg): 179/62 Body Mass Index(BMI): 28 Temperature(F): 97.9 Respiratory Rate 18 (breaths/min): Photos: [1:No Photos] [2:No Photos] [N/A:N/A] Wound Location: [1:Right Lower Leg - Medial] [2:Left Lower Leg - Medial] [N/A:N/A] Wounding Event: [1:Gradually Appeared] [2:Gradually Appeared] [N/A:N/A] Primary Etiology: [1:Diabetic Wound/Ulcer of the Lower Extremity] [2:Diabetic Wound/Ulcer of the Lower Extremity] [N/A:N/A] Secondary Etiology: [1:Lymphedema] [2:N/A] [N/A:N/A] Comorbid History: [1:Glaucoma, Lymphedema, Hypertension, Type II Diabetes, Gout] [2:Glaucoma, Lymphedema, Hypertension, Type II Diabetes, Gout] [N/A:N/A] Date Acquired: [1:11/28/2016] [2:12/10/2016] [N/A:N/A] Weeks of Treatment: [1:3] [2:2] [N/A:N/A] Wound Status: [1:Healed - Epithelialized] [2:Open] [N/A:N/A] Measurements L x W x D [1:0x0x0] [2:0.8x1.8x0.1] [N/A:N/A] (cm) Area (cm) : [1:0] [2:1.131] [N/A:N/A] Volume (cm) : [1:0] [2:0.113] [N/A:N/A] % Reduction in Area: [1:100.00%] [2:-9.10%] [N/A:N/A] % Reduction in Volume: [1:100.00%] [2:-8.70%] [N/A:N/A] Classification: [1:Grade 1] [2:Grade 1] [N/A:N/A] Exudate Amount: [1:None Present] [2:Large] [N/A:N/A] Exudate Type:  [1:N/A] [2:Serosanguineous] [N/A:N/A] Exudate Color: [1:N/A] [2:red, brown] [N/A:N/A] Wound Margin: [1:Flat and Intact] [2:Flat and Intact] [N/A:N/A] Granulation Amount: [1:None Present (0%)] [2:Large (67-100%)] [N/A:N/A] Granulation Quality: [1:N/A] [2:Red] [N/A:N/A] Necrotic Amount: [1:None Present (0%)] [2:Small (1-33%)] [N/A:N/A] Exposed Structures: [1:Fat Layer (Subcutaneous Tissue) Exposed: Yes Fascia: No Tendon: No Muscle: No Joint: No Bone: No] [2:Fat Layer (Subcutaneous Tissue) Exposed: Yes Fascia: No Tendon: No Muscle: No Joint: No Bone: No] [N/A:N/A] Epithelialization: [1:Large (67-100%)] [2:None] [N/A:N/A] Periwound Skin Texture: [1:Excoriation: No Induration: No Callus: No] [2:Excoriation: No Induration: No Callus: No] [N/A:N/A] Crepitus: No Crepitus: No Rash: No Rash: No Scarring: No Scarring: No Periwound Skin Moisture: Dry/Scaly: Yes Maceration: Yes N/A Maceration: No Dry/Scaly: No Periwound Skin Color: Atrophie Blanche: No Atrophie Blanche: No N/A Cyanosis: No Cyanosis: No Ecchymosis: No Ecchymosis: No Erythema: No Erythema: No Hemosiderin Staining: No Hemosiderin Staining: No Mottled: No Mottled: No Pallor: No Pallor: No Rubor: No Rubor: No Temperature: N/A No Abnormality N/A Tenderness on Palpation: Yes Yes N/A Wound Preparation: Ulcer Cleansing: Ulcer Cleansing: N/A Rinsed/Irrigated with Saline Rinsed/Irrigated with Saline Topical Anesthetic Applied: Topical Anesthetic Applied: None Other: lidocaine 4% Treatment Notes Wound #2 (Left, Medial Lower Leg) 1. Cleansed with: Clean wound with Normal Saline 2. Anesthetic Topical Lidocaine 4% cream to wound bed prior to debridement 4. Dressing Applied: Other dressing (specify in notes) 5. Secondary Dressing Applied Bordered Foam Dressing Notes silvercel Electronic Signature(s) Signed: 12/29/2016 8:14:14 AM By: Linton Ham MD Entered By: Linton Ham on 12/28/2016 09:17:38 Thomas Mcguire ([id number]) -------------------------------------------------------------------------------- [place] Details Patient Name: Thomas Mcguire, Thomas Mcguire. Date of Service: 12/28/2016 8:45 AM Medical Record Number: [id number] Patient Account Number: [id number] Date of Birth/Sex: 04/07/1940 (76 y.o. Male) Treating RN: Ahmed Prima Primary Care Madhavi Hamblen: Leanna Battles Other Clinician: Referring Guenther Dunshee: Leanna Battles Treating Karlina Suares/Extender: Ricard Dillon Weeks in Treatment: 3 Active Inactive ` Peripheral Neuropathy Nursing Diagnoses: Knowledge deficit related to disease process and management of peripheral neurovascular dysfunction Goals: Patient/caregiver will verbalize understanding of disease process and disease management Date Initiated: 12/07/2016 Target Resolution Date: 12/28/2016 Goal Status: Active Interventions: Assess signs and symptoms of neuropathy upon admission and as needed Provide education on Management of Neuropathy and Related Ulcers Notes: ` Wound/Skin Impairment Nursing Diagnoses: Impaired tissue integrity Knowledge deficit related to ulceration/compromised skin integrity Goals: Patient/caregiver will verbalize understanding of skin care regimen Date Initiated: 12/07/2016 Target Resolution Date: 12/28/2016 Goal Status: Active Ulcer/skin breakdown will have a volume reduction of 30% by week 4 Date Initiated: 12/07/2016 Target Resolution Date: 12/28/2016 Goal Status: Active Interventions: Assess patient/caregiver ability to obtain necessary supplies Assess patient/caregiver ability to perform ulcer/skin care regimen upon  admission and as needed Assess ulceration(s) every visit Treatment Activities: Skin care regimen initiated : 12/07/2016 Topical wound management initiated : 12/07/2016 Notes: IRVINE, GLORIOSO ([id number]) Electronic Signature(s) Signed: 12/28/2016 4:52:40 PM By: Alric Quan Entered By:  Alric Quan on 12/28/2016 08:56:31 XYLON, CROOM ([id number]) -------------------------------------------------------------------------------- Pain Assessment Details Patient Name: Thomas Mcguire, Thomas Mcguire. Date of Service: 12/28/2016 8:45 AM Medical Record Number: [id number] Patient Account Number: [id number] Date of Birth/Sex: Aug 03, 1940 (76 y.o. Male) Treating RN: Ahmed Prima Primary Care Laiyah Exline: Leanna Battles Other Clinician: Referring Cullan Launer: Leanna Battles Treating Bueford Arp/Extender: Ricard Dillon Weeks in Treatment: 3 Active Problems Location of Pain Severity and Description of Pain Patient Has Paino No Site Locations Pain Management and Medication Current Pain Management: Electronic Signature(s) Signed: 12/28/2016 4:52:40 PM By: Alric Quan Entered By: Alric Quan on 12/28/2016 08:44:34 Thomas Mcguire ([id number]) -------------------------------------------------------------------------------- Patient/Caregiver Education Details Patient Name: Thomas Mcguire, Thomas Mcguire. Date of Service: 12/28/2016 8:45 AM Medical Record Number: [id number] Patient Account Number: [id number] Date of Birth/Gender: March 09, 1940 (76 y.o. Male) Treating RN: Ahmed Prima Primary Care Physician: Leanna Battles Other Clinician: Referring Physician: Leanna Battles Treating Physician/Extender: Tito Dine in Treatment: 3 Education Assessment Education Provided To: Patient Education Topics Provided Wound/Skin Impairment: Handouts: Other: change dressing as ordered Methods: Demonstration, Explain/Verbal Responses: State content correctly Electronic Signature(s) Signed: 12/28/2016 4:52:40 PM By: Alric Quan Entered By: Alric Quan on 12/28/2016 09:11:01 Thomas Mcguire ([id number]) -------------------------------------------------------------------------------- Wound Assessment Details Patient Name: Thomas Mcguire, Thomas Mcguire. Date of Service:  12/28/2016 8:45 AM Medical Record Number: [id number] Patient Account Number: [id number] Date of Birth/Sex: 07/25/1940 (76 y.o. Male) Treating RN: Carolyne Fiscal, Debi Primary Care Emilea Goga: Leanna Battles Other Clinician: Referring Munirah Doerner: Leanna Battles Treating Orville Mena/Extender: Ricard Dillon Weeks in Treatment: 3 Wound Status Wound Number: 1 Primary Diabetic Wound/Ulcer of the Lower Etiology: Extremity Wound Location: Right Lower Leg - Medial Secondary Lymphedema Wounding Event: Gradually Appeared Etiology: Date Acquired: 11/28/2016 Wound Status: Healed - Epithelialized Weeks Of Treatment: 3 Comorbid Glaucoma, Lymphedema, Hypertension, Clustered Wound: No History: Type II Diabetes, Gout Photos Photo Uploaded By: Alric Quan on 12/28/2016 14:34:57 Wound Measurements Length: (cm) 0 Width: (cm) 0 Depth: (cm) 0 Area: (cm) 0 Volume: (cm) 0 % Reduction in Area: 100% % Reduction in Volume: 100% Epithelialization: Large (67-100%) Tunneling: No Undermining: No Wound Description Classification: Grade 1 Wound Margin: Flat and Intact Exudate Amount: None Present Foul Odor After Cleansing: No Slough/Fibrino No Wound Bed Granulation Amount: None Present (0%) Exposed Structure Necrotic Amount: None Present (0%) Fascia Exposed: No Fat Layer (Subcutaneous Tissue) Exposed: Yes Tendon Exposed: No Muscle Exposed: No Joint Exposed: No Bone Exposed: No Periwound Skin Texture Texture Color No Abnormalities Noted: No No Abnormalities Noted: No Thomas Mcguire, Thomas Mcguire ([id number]) Callus: No Atrophie Blanche: No Crepitus: No Cyanosis: No Excoriation: No Ecchymosis: No Induration: No Erythema: No Rash: No Hemosiderin Staining: No Scarring: No Mottled: No Pallor: No Moisture Rubor: No No Abnormalities Noted: No Dry / Scaly: Yes Temperature / Pain Maceration: No Tenderness on Palpation: Yes Wound Preparation Ulcer Cleansing: Rinsed/Irrigated with  Saline Topical Anesthetic Applied: None Electronic Signature(s) Signed: 12/28/2016 4:52:40 PM By: Alric Quan Entered By: Alric Quan on 12/28/2016 09:08:30 Thomas Mcguire ([id number]) -------------------------------------------------------------------------------- Wound Assessment Details Patient Name: Thomas Mcguire. Date of Service: 12/28/2016 8:45 AM Medical Record Number: [id number] Patient Account Number: [id number] Date of Birth/Sex: September 16, 1940 (76 y.o. Male) Treating RN: Ahmed Prima Primary Care Dasia Guerrier: Leanna Battles Other Clinician: Referring Shambhavi Salley: Leanna Battles Treating Mechele Kittleson/Extender: Ricard Dillon Weeks in Treatment: 3 Wound  Status Wound Number: 2 Primary Diabetic Wound/Ulcer of the Lower Extremity Etiology: Wound Location: Left Lower Leg - Medial Wound Status: Open Wounding Event: Gradually Appeared Comorbid Glaucoma, Lymphedema, Hypertension, Type Date Acquired: 12/10/2016 History: II Diabetes, Gout Weeks Of Treatment: 2 Clustered Wound: No Photos Photo Uploaded By: Alric Quan on 12/28/2016 14:34:57 Wound Measurements Length: (cm) 0.8 Width: (cm) 1.8 Depth: (cm) 0.1 Area: (cm) 1.131 Volume: (cm) 0.113 % Reduction in Area: -9.1% % Reduction in Volume: -8.7% Epithelialization: None Tunneling: No Undermining: No Wound Description Classification: Grade 1 Wound Margin: Flat and Intact Exudate Amount: Large Exudate Type: Serosanguineous Exudate Color: red, brown Foul Odor After Cleansing: No Slough/Fibrino No Wound Bed Granulation Amount: Large (67-100%) Exposed Structure Granulation Quality: Red Fascia Exposed: No Necrotic Amount: Small (1-33%) Fat Layer (Subcutaneous Tissue) Exposed: Yes Necrotic Quality: Adherent Slough Tendon Exposed: No Muscle Exposed: No Joint Exposed: No Bone Exposed: No Periwound Skin Texture ISIAC, BREIGHNER ([id number]) Texture Color No Abnormalities Noted: No No  Abnormalities Noted: No Callus: No Atrophie Blanche: No Crepitus: No Cyanosis: No Excoriation: No Ecchymosis: No Induration: No Erythema: No Rash: No Hemosiderin Staining: No Scarring: No Mottled: No Pallor: No Moisture Rubor: No No Abnormalities Noted: No Dry / Scaly: No Temperature / Pain Maceration: Yes Temperature: No Abnormality Tenderness on Palpation: Yes Wound Preparation Ulcer Cleansing: Rinsed/Irrigated with Saline Topical Anesthetic Applied: Other: lidocaine 4%, Treatment Notes Wound #2 (Left, Medial Lower Leg) 1. Cleansed with: Clean wound with Normal Saline 2. Anesthetic Topical Lidocaine 4% cream to wound bed prior to debridement 4. Dressing Applied: Other dressing (specify in notes) 5. Secondary Dressing Applied Bordered Foam Dressing Notes [employer]cel Electronic Signature(s) Signed: 12/28/2016 4:52:40 PM By: Alric Quan Entered By: Alric Quan on 12/28/2016 08:51:47 Thomas Mcguire ([id number]) -------------------------------------------------------------------------------- [place] Details Patient Name: EWELL, BENASSI. Date of Service: 12/28/2016 8:45 AM Medical Record Number: [id number] Patient Account Number: [id number] Date of Birth/Sex: March 31, 1940 (76 y.o. Male) Treating RN: Carolyne Fiscal, Debi Primary Care Adriel Kessen: Leanna Battles Other Clinician: Referring Mckenna Gamm: Leanna Battles Treating Shamell Hittle/Extender: Ricard Dillon Weeks in Treatment: 3 Vital Signs Time Taken: 08:44 Temperature (F): 97.9 Height (in): 79 Pulse (bpm): 55 Weight (lbs): 245 Respiratory Rate (breaths/min): 18 Body Mass Index (BMI): 27.6 Blood Pressure (mmHg): 179/62 Reference Range: 80 - 120 mg / dl Electronic Signature(s) Signed: 12/28/2016 4:52:40 PM By: Alric Quan Entered By: Alric Quan on 12/28/2016 08:50:04

## 2017-01-12 ENCOUNTER — Encounter: Payer: Medicare Other | Attending: Internal Medicine | Admitting: Internal Medicine

## 2017-01-12 DIAGNOSIS — L97221 Non-pressure chronic ulcer of left calf limited to breakdown of skin: Secondary | ICD-10-CM | POA: Insufficient documentation

## 2017-01-12 DIAGNOSIS — I87311 Chronic venous hypertension (idiopathic) with ulcer of right lower extremity: Secondary | ICD-10-CM | POA: Diagnosis not present

## 2017-01-12 DIAGNOSIS — E11622 Type 2 diabetes mellitus with other skin ulcer: Secondary | ICD-10-CM | POA: Insufficient documentation

## 2017-01-12 DIAGNOSIS — E7800 Pure hypercholesterolemia, unspecified: Secondary | ICD-10-CM | POA: Diagnosis not present

## 2017-01-12 DIAGNOSIS — E1122 Type 2 diabetes mellitus with diabetic chronic kidney disease: Secondary | ICD-10-CM | POA: Insufficient documentation

## 2017-01-12 DIAGNOSIS — E1142 Type 2 diabetes mellitus with diabetic polyneuropathy: Secondary | ICD-10-CM | POA: Insufficient documentation

## 2017-01-12 DIAGNOSIS — N183 Chronic kidney disease, stage 3 (moderate): Secondary | ICD-10-CM | POA: Insufficient documentation

## 2017-01-12 DIAGNOSIS — I129 Hypertensive chronic kidney disease with stage 1 through stage 4 chronic kidney disease, or unspecified chronic kidney disease: Secondary | ICD-10-CM | POA: Insufficient documentation

## 2017-01-12 DIAGNOSIS — L97211 Non-pressure chronic ulcer of right calf limited to breakdown of skin: Secondary | ICD-10-CM | POA: Insufficient documentation

## 2017-01-13 NOTE — Progress Notes (Signed)
RYER, ASATO ([id number]) Visit Report for 01/12/2017 HPI Details Patient Name: WASIL, WOLKE. Date of Service: 01/12/2017 8:45 AM Medical Record Number: [id number] Patient Account Number: [id number] Date of Birth/Sex: 1940-06-24 (77 y.o. 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 He is here for review of wound on the right lower leg. 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 He has chronic edema in his lower legs and uses 20-30 mm compression stockings which she is compliant with. He is a nonsmoker. Beside his diabetes he has hypertension and hypercholesterolemia and in 2013 he had stage III chronic renal failure. He tells me his primary physician Dr. Valetta Fuller at [place] follows him every 3 months for his kidney failure. He also states he saw a nephrologist once. ABIs in this clinic were noncompressible. He tells me he saw Dr. Doren Custard in 6 or 7 years ago at [facility] in [place]. He does not remember what the issue was. It would appear that the patient has had recurrent blistering in the lower extremities as there are several healed areas on the left anterior and right anterior calves 12/14/16 on evaluation today patient appears to be doing very well with the current three layer compression wraps. His right lower extremity is significantly improved as far as the swelling is concerned. He has been tolerating the wraps without any complication. The wounds also appear to be doing better compared to last week's evaluation. Unfortunately he does have a new dramatic wound to the left interior shin but otherwise  things are looking up. No fevers chills noted. Patient does have a appointment with [facility] [place] on December 23, 2016 four arterial studies. 12/22/16 area x2 on right leg are closed. Still one open area on left leg. arterial studies tomorrow 12/28/16; 2 wounds on the right leg are closed. He still has one open area on the left leg. ARTERIAL studies were done on 12/23/16 D showed an ABI on the right of 0.99 and on the left at 0.68. His toe brachial index was 0.26 on the right. Monophasic waveforms at both the posterior tibial and dorsalis pedis arteries. The patient saw Dr. Trula Slade of vascular surgery yesterday. He stated he would follow him for the remaining wound on the left leg. He has been using silver alginate 01/12/17; the area on the left leg is closed. His original wounds on the right leg are closed as well. He has 20-30 mm compression stockings. He is following with Dr. Lita Mains ham for his arterial issues however his wounds are closed and he is tolerating 20-30 mm stockings Electronic Signature(s) Signed: 01/12/2017 5:48:27 PM By: Linton Ham MD Entered By: Linton Ham on 01/12/2017 09:37:05 [name] ([id number]) -------------------------------------------------------------------------------- Physical Exam Details Patient Name: ARDIT, DANH. Date of Service: 01/12/2017 8:45 AM Medical Record Number: [id number] Patient Account Number: [id number] Date of Birth/Sex: [birth year] (77 y.o. Male) Treating RN: Carolyne Fiscal, Debi Primary Care Provider: Leanna Battles Other Clinician: Referring Provider: Leanna Battles Treating Provider/Extender: Ricard Dillon Weeks in Treatment: 5 Notes wound exam; the area on question on the left anterior tibial area is closed. Reasonable edema control is noted. There is no open area on either leg.  He has 20-30 mm stockings Electronic Signature(s) Signed: 01/12/2017 5:48:27 PM By: Linton Ham MD Entered By:  Linton Ham on 01/12/2017 09:37:59 [name] ([id number]) -------------------------------------------------------------------------------- Physician Orders Details Patient Name: ELMAR, ANTIGUA. Date of Service: 01/12/2017 8:45 AM Medical Record Number: [id number] Patient Account Number: [id number] Date of Birth/Sex: 06-May-1940 (77 y.o. Male) Treating RN: Ahmed Prima Primary Care Provider: Leanna Battles Other Clinician: Referring Provider: Leanna Battles Treating Provider/Extender: Tito Dine in Treatment: 5 Verbal / Phone Orders: Yes Clinician: Carolyne Fiscal, Debi Read Back and Verified: Yes Diagnosis Coding Discharge From [facility] Services o Discharge from [place] area clean and dry. Keep legs lubricated. Please call our office if you have any questions or concerns. Electronic Signature(s) Signed: 01/12/2017 5:16:05 PM By: Alric Quan Signed: 01/12/2017 5:48:27 PM By: Linton Ham MD Entered By: Alric Quan on 01/12/2017 09:11:10 [name] ([id number]) -------------------------------------------------------------------------------- Problem List Details Patient Name: DEMONTAE, ANTUNES. Date of Service: 01/12/2017 8:45 AM Medical Record Number: [id number] Patient Account Number: [id number] Date of Birth/Sex: 1940/09/05 (77 y.o. 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 Date of Service: 01/12/2017 8:45 AM Medical Record Number: [id number] Patient Account Number: [id number] Date of Birth/Sex: 01-10-1941 (77 y.o. 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 He is here for review of wound on the right lower leg. 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 He has chronic edema in his lower legs and uses 20-30 mm compression stockings which she is compliant with. He is a nonsmoker. Beside his diabetes he has hypertension and hypercholesterolemia and in 2013 he had stage III chronic renal failure. He tells me his primary physician Dr. Valetta Fuller at [facility] follows him every 3 months for his kidney failure. He also states he saw a nephrologist once. ABIs in this clinic were noncompressible. He tells me he saw Dr. Doren Custard in 6 or 7 years ago at [facility] in [place]. He does not remember what the issue was. It would appear that the patient has had recurrent blistering in the lower extremities as there are several healed areas on the left anterior and right anterior calves 12/14/16 on evaluation today patient appears to be doing very well with the current three  layer compression wraps. His right lower extremity is significantly improved as far as the swelling is concerned. He has been tolerating the wraps without any complication. The wounds also appear to be doing better compared to last week's evaluation. Unfortunately  he does have a new dramatic wound to the left interior shin but otherwise things are looking up. No fevers chills noted. Patient does have a appointment with [facility] [place] on December 23, 2016 four arterial studies. 12/22/16 area x2 on right leg are closed. Still one open area on left leg. arterial studies tomorrow 12/28/16; 2 wounds on the right leg are closed. He still has one open area on the left leg. ARTERIAL studies were done on 12/23/16 D showed an ABI on the right of 0.99 and on the left at 0.68. His toe brachial index was 0.26 on the right. Monophasic waveforms at both the posterior tibial and dorsalis pedis arteries. The patient saw Dr. Trula Slade of vascular surgery yesterday. He stated he would follow him for the remaining wound on the left leg. He has been using silver alginate 01/12/17; the area on the left leg is closed. His original wounds on the right leg are closed as well. He has 20-30 mm compression stockings. He is following with Dr. Lita Mains ham for his arterial issues however his wounds are closed and he is tolerating 20-30 mm stockings Objective Constitutional Vitals Time Taken: 8:43 AM, Height: 79 in, Weight: 245 lbs, BMI: 27.6, Temperature: 97.9 F, Pulse: 62 bpm, Respiratory Rate: 18 breaths/min, Blood Pressure: 190/68 mmHg. General Notes: Made Dr. Dellia Nims aware of BP. BP was taken manual. Pt states that he was in a rush to get here. Pt also states that he did not take his BP medication this morning, he states that he will take it when he gets home and recheck his BP. Integumentary (Hair, Skin) GRYFFIN, ALTICE ([id number]) Wound #2 status is Healed - Epithelialized. Original cause of wound was  Gradually Appeared. The wound is located on the Left,Medial Lower Leg. The wound measures 0cm length x 0cm width x 0cm depth; 0cm^2 area and 0cm^3 volume. There is Fat Layer (Subcutaneous Tissue) Exposed exposed. There is no tunneling or undermining noted. There is a none present amount of drainage noted. The wound margin is flat and intact. There is no granulation within the wound bed. There is no necrotic tissue within the wound bed. The periwound skin appearance did not exhibit: Callus, Crepitus, Excoriation, Induration, Rash, Scarring, Dry/Scaly, Maceration, Atrophie Blanche, Cyanosis, Ecchymosis, Hemosiderin Staining, Mottled, Pallor, Rubor, Erythema. Periwound temperature was noted as No Abnormality. Assessment Active Problems ICD-10 L97.211 - Non-pressure chronic ulcer of right calf limited to breakdown of skin E11.622 - Type 2 diabetes mellitus with other skin ulcer I87.311 - Chronic venous hypertension (idiopathic) with ulcer of right lower extremity E11.42 - Type 2 diabetes mellitus with diabetic polyneuropathy L97.221 - Non-pressure chronic ulcer of left calf limited to breakdown of skin Plan Discharge From [facility] Services: Discharge from [place] area clean and dry. Keep legs lubricated. Please call our office if you have any questions or concerns. #1 the patient can be discharged from the clinic to his own 20-30 mm stockings #2 he has follow-up with [facility] in [facility] Dr. Trula Slade with regards to his arterial issues. Electronic Signature(s) Signed: 01/12/2017 5:48:27 PM By: Linton Ham MD Entered By: Linton Ham on 01/12/2017 09:38:34 [name] ([id number]) -------------------------------------------------------------------------------- [place] Details Patient Name: MAGGIE, DWORKIN. Date of Service: 01/12/2017 Medical Record Number: [id number] Patient Account Number: [id number] Date of Birth/Sex: 1941/01/08 (77 y.o. 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

## 2017-01-14 NOTE — Progress Notes (Signed)
Thomas, Mcguire ([id number]) Visit Report for 01/12/2017 Arrival Information Details Patient Name: Thomas Mcguire, Thomas Mcguire. Date of Service: 01/12/2017 8:45 AM Medical Record Number: [id number] Patient Account Number: [id number] Date of Birth/Sex: 03-13-1940 (77 y.o. Male) Treating RN: Ahmed Prima Primary Care Aryianna Earwood: Leanna Battles Other Clinician: Referring Otilia Kareem: Leanna Battles Treating Garlin Batdorf/Extender: Tito Dine in Treatment: 5 Visit Information History Since Last Visit All ordered tests and consults were completed: No Patient Arrived: Ambulatory Added or deleted any medications: No Arrival Time: 08:43 Any new allergies or adverse reactions: No Accompanied By: self Had a fall or experienced change in No Transfer Assistance: None activities of daily living that may affect Patient Identification Verified: Yes risk of falls: Secondary Verification Process Yes Signs or symptoms of abuse/neglect since last visito No Completed: Hospitalized since last visit: No Patient Requires Transmission-Based No Has Dressing in Place as Prescribed: Yes Precautions: Has Compression in Place as Prescribed: Yes Patient Has Alerts: Yes Pain Present Now: No Patient Alerts: ABI [place] BILATERAL Type II diabetes Electronic Signature(s) Signed: 01/12/2017 5:16:05 PM By: Alric Quan Entered By: Alric Quan on 01/12/2017 08:43:34 Thomas Mcguire ([id number]) -------------------------------------------------------------------------------- Clinic Level of Care Assessment Details Patient Name: Thomas Mcguire. Date of Service: 01/12/2017 8:45 AM Medical Record Number: [id number] Patient Account Number: [id number] Date of Birth/Sex: [birth year] (77 y.o. Male) Treating RN: Carolyne Fiscal, Debi Primary Care Ludean Duhart: Leanna Battles Other Clinician: Referring Cherell Colvin: Leanna Battles Treating Ameliya Nicotra/Extender: Tito Dine in Treatment: 5 Clinic Level of Care  Assessment Items TOOL 4 Quantity Score X - Use when only an EandM is performed on FOLLOW-UP visit 1 0 ASSESSMENTS - Nursing Assessment / Reassessment X - Reassessment of Co-morbidities (includes updates in patient status) 1 10 X- 1 5 Reassessment of Adherence to Treatment Plan ASSESSMENTS - Wound and Skin Assessment / Reassessment X - Simple Wound Assessment / Reassessment - one wound 1 5 []  - 0 Complex Wound Assessment / Reassessment - multiple wounds []  - 0 Dermatologic / Skin Assessment (not related to wound area) ASSESSMENTS - Focused Assessment []  - Circumferential Edema Measurements - multi extremities 0 []  - 0 Nutritional Assessment / Counseling / Intervention []  - 0 Lower Extremity Assessment (monofilament, tuning fork, pulses) []  - 0 Peripheral Arterial Disease Assessment (using hand held doppler) ASSESSMENTS - Ostomy and/or Continence Assessment and Care []  - Incontinence Assessment and Management 0 []  - 0 Ostomy Care Assessment and Management (repouching, etc.) PROCESS - Coordination of Care X - Simple Patient / Family Education for ongoing care 1 15 []  - 0 Complex (extensive) Patient / Family Education for ongoing care []  - 0 Staff obtains Programmer, systems, Records, Test Results / Process Orders []  - 0 Staff telephones HHA, Nursing Homes / Clarify orders / etc []  - 0 Routine Transfer to another Facility (non-emergent condition) []  - 0 Routine Hospital Admission (non-emergent condition) []  - 0 New Admissions / Biomedical engineer / Ordering NPWT, Apligraf, etc. []  - 0 Emergency Hospital Admission (emergent condition) X- 1 10 Simple Discharge Coordination EDY, BELT ([id number]) []  - 0 Complex (extensive) Discharge Coordination PROCESS - Special Needs []  - Pediatric / Minor Patient Management 0 []  - 0 Isolation Patient Management []  - 0 Hearing / Language / Visual special needs []  - 0 Assessment of Community assistance (transportation, D/C planning,  etc.) []  - 0 Additional assistance / Altered mentation []  - 0 Support Surface(s) Assessment (bed, cushion, seat, etc.) INTERVENTIONS - Wound Cleansing / Measurement X - Simple Wound Cleansing - one wound 1 5 []  -  0 Complex Wound Cleansing - multiple wounds X- 1 5 Wound Imaging (photographs - any number of wounds) []  - 0 Wound Tracing (instead of photographs) []  - 0 Simple Wound Measurement - one wound []  - 0 Complex Wound Measurement - multiple wounds INTERVENTIONS - Wound Dressings []  - Small Wound Dressing one or multiple wounds 0 []  - 0 Medium Wound Dressing one or multiple wounds []  - 0 Large Wound Dressing one or multiple wounds []  - 0 Application of Medications - topical []  - 0 Application of Medications - injection INTERVENTIONS - Miscellaneous []  - External ear exam 0 []  - 0 Specimen Collection (cultures, biopsies, blood, body fluids, etc.) []  - 0 Specimen(s) / Culture(s) sent or taken to Lab for analysis []  - 0 Patient Transfer (multiple staff / Civil Service fast streamer / Similar devices) []  - 0 Simple Staple / Suture removal (25 or less) []  - 0 Complex Staple / Suture removal (26 or more) []  - 0 Hypo / Hyperglycemic Management (close monitor of Blood Glucose) []  - 0 Ankle / Brachial Index (ABI) - do not check if billed separately X- 1 5 Vital Signs JAMORRIS, NDIAYE ([id number]) Has the patient been seen at the hospital within the last three years: Yes Total Score: 60 Level Of Care: New/Established - Level 2 Electronic Signature(s) Signed: 01/12/2017 5:16:05 PM By: Alric Quan Entered By: Alric Quan on 01/12/2017 09:41:57 Thomas Mcguire ([id number]) -------------------------------------------------------------------------------- Encounter Discharge Information Details Patient Name: Thomas, Mcguire. Date of Service: 01/12/2017 8:45 AM Medical Record Number: [id number] Patient Account Number: [id number] Date of Birth/Sex: 04-07-1940 (77 y.o.  Male) Treating RN: Ahmed Prima Primary Care Jewelene Mairena: Leanna Battles Other Clinician: Referring Kharisma Glasner: Leanna Battles Treating Rylan Bernard/Extender: Tito Dine in Treatment: 5 Encounter Discharge Information Items Discharge Pain Level: 0 Discharge Condition: Stable Ambulatory Status: Ambulatory Discharge Destination: Home Transportation: Private Auto Accompanied By: self Schedule Follow-up Appointment: No Medication Reconciliation completed and No provided to Patient/Care Saga Balthazar: Provided on Clinical Summary of Care: 01/12/2017 Form Type Recipient Paper Patient TA Electronic Signature(s) Signed: 01/14/2017 8:42:55 AM By: Ruthine Dose Previous Signature: 01/12/2017 8:58:30 AM Version By: Alric Quan Entered By: Ruthine Dose on 01/12/2017 09:13:16 Thomas Mcguire ([id number]) -------------------------------------------------------------------------------- Lower Extremity Assessment Details Patient Name: CREIGHTON, LONGLEY. Date of Service: 01/12/2017 8:45 AM Medical Record Number: [id number] Patient Account Number: [id number] Date of Birth/Sex: 04-30-1940 (77 y.o. Male) Treating RN: Ahmed Prima Primary Care Herby Amick: Leanna Battles Other Clinician: Referring Donnis Phaneuf: Leanna Battles Treating Niyah Mamaril/Extender: Ricard Dillon Weeks in Treatment: 5 Vascular Assessment Pulses: Dorsalis Pedis Palpable: [Left:Yes] Posterior Tibial Extremity colors, hair growth, and conditions: Extremity Color: [Left:Normal] Temperature of Extremity: [Left:Cool] Capillary Refill: [Left:> 3 seconds] Toe Nail Assessment Left: Right: Thick: No Discolored: No Deformed: Yes Improper Length and Hygiene: Yes Electronic Signature(s) Signed: 01/12/2017 8:57:59 AM By: Alric Quan Entered By: Alric Quan on 01/12/2017 08:57:59 Thomas Mcguire ([id number]) -------------------------------------------------------------------------------- Multi Wound  Chart Details Patient Name: Thomas Mcguire. Date of Service: 01/12/2017 8:45 AM Medical Record Number: [id number] Patient Account Number: [id number] Date of Birth/Sex: [birth year] (77 y.o. Male) Treating RN: Ahmed Prima Primary Care Delyle Weider: Leanna Battles Other Clinician: Referring Avonne Berkery: Leanna Battles Treating Frances Ambrosino/Extender: Ricard Dillon Weeks in Treatment: 5 Vital Signs Height(in): 79 Pulse(bpm): 76 Weight(lbs): 245 Blood Pressure(mmHg): 190/68 Body Mass Index(BMI): 28 Temperature(F): 97.9 Respiratory Rate 18 (breaths/min): Photos: [2:No Photos] [N/A:N/A] Wound Location: [2:Left Lower Leg - Medial] [N/A:N/A] Wounding Event: [2:Gradually Appeared] [N/A:N/A] Primary Etiology: [2:Diabetic Wound/Ulcer of the Lower Extremity] [N/A:N/A] Comorbid History: [2:Glaucoma,  Lymphedema, Hypertension, Type II Diabetes, Gout] [N/A:N/A] Date Acquired: [2:12/10/2016] [N/A:N/A] Weeks of Treatment: [2:4] [N/A:N/A] Wound Status: [2:Healed - Epithelialized] [N/A:N/A] Measurements L x W x D [2:0x0x0] [N/A:N/A] (cm) Area (cm) : [2:0] [N/A:N/A] Volume (cm) : [2:0] [N/A:N/A] % Reduction in Area: [2:100.00%] [N/A:N/A] % Reduction in Volume: [2:100.00%] [N/A:N/A] Classification: [2:Grade 1] [N/A:N/A] Exudate Amount: [2:None Present] [N/A:N/A] Wound Margin: [2:Flat and Intact] [N/A:N/A] Granulation Amount: [2:None Present (0%)] [N/A:N/A] Necrotic Amount: [2:None Present (0%)] [N/A:N/A] Exposed Structures: [2:Fat Layer (Subcutaneous Tissue) Exposed: Yes Fascia: No Tendon: No Muscle: No Joint: No Bone: No] [N/A:N/A] Epithelialization: [2:None] [N/A:N/A] Periwound Skin Texture: [2:Excoriation: No Induration: No Callus: No Crepitus: No Rash: No Scarring: No] [N/A:N/A] Periwound Skin Moisture: [N/A:N/A] Maceration: No Dry/Scaly: No Periwound Skin Color: Atrophie Blanche: No N/A N/A Cyanosis: No Ecchymosis: No Erythema: No Hemosiderin Staining: No Mottled: No Pallor:  No Rubor: No Temperature: No Abnormality N/A N/A Tenderness on Palpation: No N/A N/A Wound Preparation: Ulcer Cleansing: N/A N/A Rinsed/Irrigated with Saline Topical Anesthetic Applied: None Treatment Notes Electronic Signature(s) Signed: 01/12/2017 5:48:27 PM By: Linton Ham MD Previous Signature: 01/12/2017 8:58:08 AM Version By: Alric Quan Entered By: Linton Ham on 01/12/2017 09:36:10 Thomas Mcguire ([id number]) -------------------------------------------------------------------------------- Multi-Disciplinary Care Plan Details Patient Name: AJAMU, MAXON. Date of Service: 01/12/2017 8:45 AM Medical Record Number: [id number] Patient Account Number: [id number] Date of Birth/Sex: Jan 20, 1940 (77 y.o. Male) Treating RN: Ahmed Prima Primary Care Bristal Steffy: Leanna Battles Other Clinician: Referring Mallorey Odonell: Leanna Battles Treating Leighanna Kirn/Extender: Ricard Dillon Weeks in Treatment: 5 Active Inactive Electronic Signature(s) Signed: 01/12/2017 5:16:05 PM By: Alric Quan Previous Signature: 01/12/2017 8:58:03 AM Version By: Alric Quan Entered By: Alric Quan on 01/12/2017 09:10:06 Thomas Mcguire ([id number]) -------------------------------------------------------------------------------- Pain Assessment Details Patient Name: BRAXEN, DOBEK. Date of Service: 01/12/2017 8:45 AM Medical Record Number: [id number] Patient Account Number: [id number] Date of Birth/Sex: 05/02/1940 (77 y.o. Male) Treating RN: Ahmed Prima Primary Care Brycelynn Stampley: Leanna Battles Other Clinician: Referring Keeyon Privitera: Leanna Battles Treating Manaal Mandala/Extender: Ricard Dillon Weeks in Treatment: 5 Active Problems Location of Pain Severity and Description of Pain Patient Has Paino No Site Locations Pain Management and Medication Current Pain Management: Electronic Signature(s) Signed: 01/12/2017 5:16:05 PM By: Alric Quan Entered By: Alric Quan on 01/12/2017 08:43:40 Thomas Mcguire ([id number]) -------------------------------------------------------------------------------- Patient/Caregiver Education Details Patient Name: RIEN, MARLAND. Date of Service: 01/12/2017 8:45 AM Medical Record Number: [id number] Patient Account Number: [id number] Date of Birth/Gender: 02-27-1940 (77 y.o. Male) Treating RN: Carolyne Fiscal, Debi Primary Care Physician: Leanna Battles Other Clinician: Referring Physician: Leanna Battles Treating Physician/Extender: Tito Dine in Treatment: 5 Education Assessment Education Provided To: Patient Education Topics Provided Wound/Skin Impairment: Handouts: Other: Please call our office if you have any questions or concerns. Methods: Explain/Verbal Responses: State content correctly Electronic Signature(s) Signed: 01/12/2017 5:16:05 PM By: Alric Quan Entered By: Alric Quan on 01/12/2017 09:11:26 Thomas Mcguire ([id number]) -------------------------------------------------------------------------------- Wound Assessment Details Patient Name: JAHKI, WITHAM. Date of Service: 01/12/2017 8:45 AM Medical Record Number: [id number] Patient Account Number: [id number] Date of Birth/Sex: [birth year] (77 y.o. Male) Treating RN: Ahmed Prima Primary Care Harlo Fabela: Leanna Battles Other Clinician: Referring Demiya Magno: Leanna Battles Treating Rose-Marie Hickling/Extender: Ricard Dillon Weeks in Treatment: 5 Wound Status Wound Number: 2 Primary Diabetic Wound/Ulcer of the Lower Extremity Etiology: Wound Location: Left Lower Leg - Medial Wound Status: Healed - Epithelialized Wounding Event: Gradually Appeared Comorbid Glaucoma, Lymphedema, Hypertension, Type Date Acquired: 12/10/2016 History: II Diabetes, Gout Weeks Of Treatment: 4 Clustered Wound: No Photos Photo Uploaded By:  Alric Quan on 01/12/2017 17:10:12 Wound Measurements Length: (cm) 0 Width: (cm)  0 Depth: (cm) 0 Area: (cm) 0 Volume: (cm) 0 % Reduction in Area: 100% % Reduction in Volume: 100% Epithelialization: None Tunneling: No Undermining: No Wound Description Classification: Grade 1 Wound Margin: Flat and Intact Exudate Amount: None Present Foul Odor After Cleansing: No Slough/Fibrino No Wound Bed Granulation Amount: None Present (0%) Exposed Structure Necrotic Amount: None Present (0%) Fascia Exposed: No Fat Layer (Subcutaneous Tissue) Exposed: Yes Tendon Exposed: No Muscle Exposed: No Joint Exposed: No Bone Exposed: No Periwound Skin Texture Texture Color No Abnormalities Noted: No No Abnormalities Noted: No IVAR, DOMANGUE ([id number]) Callus: No Atrophie Blanche: No Crepitus: No Cyanosis: No Excoriation: No Ecchymosis: No Induration: No Erythema: No Rash: No Hemosiderin Staining: No Scarring: No Mottled: No Pallor: No Moisture Rubor: No No Abnormalities Noted: No Dry / Scaly: No Temperature / Pain Maceration: No Temperature: No Abnormality Wound Preparation Ulcer Cleansing: Rinsed/Irrigated with Saline Topical Anesthetic Applied: None Electronic Signature(s) Signed: 01/12/2017 5:16:05 PM By: Alric Quan Entered By: Alric Quan on 01/12/2017 09:09:49 Thomas Mcguire ([id number]) -------------------------------------------------------------------------------- Vitals Details Patient Name: Thomas Mcguire. Date of Service: 01/12/2017 8:45 AM Medical Record Number: [id number] Patient Account Number: [id number] Date of Birth/Sex: February 01, 1940 (77 y.o. Male) Treating RN: Carolyne Fiscal, Debi Primary Care Bralynn Donado: Leanna Battles Other Clinician: Referring Danniell Rotundo: Leanna Battles Treating Alaysiah Browder/Extender: Ricard Dillon Weeks in Treatment: 5 Vital Signs Time Taken: 08:43 Temperature (F): 97.9 Height (in): 79 Pulse (bpm): 62 Weight (lbs): 245 Respiratory Rate (breaths/min): 18 Body Mass Index (BMI): 27.6 Blood  Pressure (mmHg): 190/68 Reference Range: 80 - 120 mg / dl Notes Made Dr. Dellia Nims aware of BP. BP was taken manual. Pt states that he was in a rush to get here. Pt also states that he did not take his BP medication this morning, he states that he will take it when he gets home and recheck his BP. Electronic Signature(s) Signed: 01/12/2017 5:16:05 PM By: Alric Quan Entered By: Alric Quan on 01/12/2017 08:52:38

## 2017-01-24 ENCOUNTER — Ambulatory Visit (INDEPENDENT_AMBULATORY_CARE_PROVIDER_SITE_OTHER): Payer: Medicare Other | Admitting: Surgery

## 2017-01-24 ENCOUNTER — Encounter: Payer: Self-pay | Admitting: Surgery

## 2017-01-24 VITALS — BP 186/72 | HR 55 | Temp 97.6°F | Resp 20 | Ht 78.0 in | Wt 252.0 lb

## 2017-01-24 DIAGNOSIS — I7025 Atherosclerosis of native arteries of other extremities with ulceration: Secondary | ICD-10-CM | POA: Diagnosis not present

## 2017-01-24 NOTE — Progress Notes (Signed)
[facility] [place]  Patient name: Thomas Mcguire MRN: [id number] DOB: February 11, 1940 Sex: male   REASON FOR VISIT:    Follow up  HISOTRY OF PRESENT ILLNESS:    Thomas Mcguire is a 77 y.o. male who I saw in December 2018 for evaluation of bilateral leg ulcers which have been present for 1 month but were getting better.  In fact the right one has almost healed.  I decided to have him come back for a wound check today.  I felt some of his wounds may be secondary to venous insufficiency and leg edema.  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  He is a diabetic.  He is a former smoker.  He takes a statin for hypercholesterolemia.  He has seen Dr. Scot Dock in our office in the remote past for claudication.  No intervention has been performed.  The patient comes in stating that his wounds have healed.  He does not complain of any symptoms of claudication.  He has no new wounds.  He does wear his compression stockings for his leg swelling.   PAST MEDICAL HISTORY:   Past Medical History:  Diagnosis Date  . Arthritis   . Chronic renal insufficiency    CHECKED Q3MOS PER PT. NO TREATMENT  . Diabetes mellitus   . Gout    Has not had recently- 08/24/11  . Hyperlipidemia   . Hypertension   . Neuromuscular disorder ([place])    Neuropathy     FAMILY HISTORY:   Family History  Problem Relation Age of Onset  . Colon cancer Neg Hx     SOCIAL HISTORY:   Social History   Tobacco Use  . Smoking status: Former Smoker    Last attempt to quit: 03/11/1996    Years since quitting: 20.8  . Smokeless tobacco: Never Used  Substance Use Topics  . Alcohol use: No     ALLERGIES:   No Known Allergies   CURRENT MEDICATIONS:   Current Outpatient Medications  Medication Sig Dispense Refill  . allopurinol (ZYLOPRIM) 300 MG tablet Take 300 mg by mouth daily.      Marland Kitchen aspirin 325 MG tablet Take  325 mg by mouth daily.      . carvedilol (COREG) 25 MG tablet Take 25 mg by mouth 2 (two) times daily with a meal.    . COD LIVER OIL PO Take 1 capsule by mouth daily.     Marland Kitchen doxazosin (CARDURA) 2 MG tablet Take 2 mg by mouth at bedtime.    . furosemide (LASIX) 40 MG tablet Take 80 mg by mouth daily.     Marland Kitchen guanFACINE (TENEX) 2 MG tablet Take 2 mg by mouth every morning.     Marland Kitchen HYDROcodone-acetaminophen (VICODIN) 5-500 MG per tablet Take 1-2 tablets by mouth daily as needed. For pain    . insulin NPH (HUMULIN N,NOVOLIN N) 100 UNIT/ML injection Inject 50 Units into the skin 2 (two) times daily. Inject 50 units in am and 50 units in pm    . insulin regular (HUMULIN R,NOVOLIN R) 100 units/mL injection Inject 22 Units into the skin 3 (three) times daily before meals.     Marland Kitchen lisinopril (PRINIVIL,ZESTRIL) 20 MG tablet Take 10 mg by mouth 2 (two) times daily.     . pravastatin (PRAVACHOL) 40 MG tablet Take 40 mg by mouth daily.       No current facility-administered medications for this  visit.     REVIEW OF SYSTEMS:   [X]  denotes positive finding, [ ]  denotes negative finding Cardiac  Comments:  Chest pain or chest pressure:    Shortness of breath upon exertion:    Short of breath when lying flat:    Irregular heart rhythm:        Vascular    Pain in calf, thigh, or hip brought on by ambulation:    Pain in feet at night that wakes you up from your sleep:     Blood clot in your veins:    Leg swelling:  x       Pulmonary    Oxygen at home:    Productive cough:     Wheezing:         Neurologic    Sudden weakness in arms or legs:     Sudden numbness in arms or legs:     Sudden onset of difficulty speaking or slurred speech:    Temporary loss of vision in one eye:     Problems with dizziness:         Gastrointestinal    Blood in stool:     Vomited blood:         Genitourinary    Burning when urinating:     Blood in urine:        Psychiatric    Major depression:           Hematologic    Bleeding problems:    Problems with blood clotting too easily:        Skin    Rashes or ulcers: x       Constitutional    Fever or chills:      PHYSICAL EXAM:   Vitals:   01/24/17 1104 01/24/17 1106  BP: (!) 179/73 (!) 186/72  Pulse: (!) 55   Resp: 20   Temp: 97.6 F (36.4 C)   TempSrc: Oral   SpO2: 100%   Weight: 252 lb (114.3 kg)   Height: 6\' 6"  (1.981 m)     GENERAL: The patient is a well-nourished male, in no acute distress. The vital signs are documented above. CARDIAC: There is a regular rate and rhythm.  VASCULAR: Nonpalpable pedal pulses.  Bilateral pitting edema PULMONARY: Non-labored respirations MUSCULOSKELETAL: There are no major deformities or cyanosis. NEUROLOGIC: No focal weakness or paresthesias are detected. SKIN: Both wounds on the anterior side of the legs bilaterally have healed PSYCHIATRIC: The patient has a normal affect.  STUDIES:   None  MEDICAL ISSUES:   Both wounds for the patient has healed.  No vascular intervention is recommended at this time.  I discussed with the patient that should he develop cramping when he walks, pain that wakes him up at night, or new wounds on his legs that are not healing, that he would need to contact me.  We also discussed the importance of wearing compression stockings.  I have him scheduled for follow-up in 1 year with repeat ABIs.  I will also get a venous insufficiency ultrasound.    Annamarie Major, MD [facility] [phone] Pager [phone]

## 2017-08-01 DIAGNOSIS — L03115 Cellulitis of right lower limb: Secondary | ICD-10-CM | POA: Insufficient documentation

## 2017-08-01 DIAGNOSIS — L97909 Non-pressure chronic ulcer of unspecified part of unspecified lower leg with unspecified severity: Secondary | ICD-10-CM | POA: Insufficient documentation

## 2017-08-01 DIAGNOSIS — I87319 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: Secondary | ICD-10-CM | POA: Insufficient documentation

## 2017-10-12 DIAGNOSIS — D631 Anemia in chronic kidney disease: Secondary | ICD-10-CM | POA: Insufficient documentation

## 2017-10-13 ENCOUNTER — Telehealth: Payer: Self-pay | Admitting: Hematology and Oncology

## 2017-10-13 NOTE — Telephone Encounter (Signed)
New referral received from Dr. Philip Aspen at [facility] has been scheduled to see Dr. Audelia Hives on 10/4 at [place]. Pt aware to arrive 30 minutes early. Letter mailed.

## 2017-10-14 ENCOUNTER — Encounter: Payer: Self-pay | Admitting: Hematology and Oncology

## 2017-10-14 ENCOUNTER — Telehealth: Payer: Self-pay | Admitting: Emergency Medicine

## 2017-10-14 ENCOUNTER — Other Ambulatory Visit: Payer: Self-pay | Admitting: Nephrology

## 2017-10-14 ENCOUNTER — Other Ambulatory Visit: Payer: Self-pay

## 2017-10-14 ENCOUNTER — Telehealth: Payer: Self-pay | Admitting: Hematology and Oncology

## 2017-10-14 ENCOUNTER — Inpatient Hospital Stay: Payer: Medicare Other

## 2017-10-14 ENCOUNTER — Inpatient Hospital Stay: Payer: Medicare Other | Attending: Hematology and Oncology | Admitting: Hematology and Oncology

## 2017-10-14 VITALS — BP 166/59 | HR 59 | Temp 98.1°F | Resp 18 | Ht 78.0 in | Wt 254.4 lb

## 2017-10-14 DIAGNOSIS — E1122 Type 2 diabetes mellitus with diabetic chronic kidney disease: Secondary | ICD-10-CM | POA: Insufficient documentation

## 2017-10-14 DIAGNOSIS — N184 Chronic kidney disease, stage 4 (severe): Secondary | ICD-10-CM | POA: Insufficient documentation

## 2017-10-14 DIAGNOSIS — I129 Hypertensive chronic kidney disease with stage 1 through stage 4 chronic kidney disease, or unspecified chronic kidney disease: Secondary | ICD-10-CM | POA: Insufficient documentation

## 2017-10-14 DIAGNOSIS — D649 Anemia, unspecified: Secondary | ICD-10-CM | POA: Insufficient documentation

## 2017-10-14 DIAGNOSIS — D72829 Elevated white blood cell count, unspecified: Secondary | ICD-10-CM

## 2017-10-14 DIAGNOSIS — Z8546 Personal history of malignant neoplasm of prostate: Secondary | ICD-10-CM | POA: Insufficient documentation

## 2017-10-14 DIAGNOSIS — D72820 Lymphocytosis (symptomatic): Secondary | ICD-10-CM | POA: Diagnosis not present

## 2017-10-14 DIAGNOSIS — D696 Thrombocytopenia, unspecified: Secondary | ICD-10-CM

## 2017-10-14 LAB — RETICULOCYTES
RBC.: 4.46 MIL/uL (ref 4.20–5.82)
Retic Count, Absolute: 44.6 10*3/uL (ref 34.8–93.9)
Retic Ct Pct: 1 % (ref 0.8–1.8)

## 2017-10-14 LAB — VITAMIN B12: Vitamin B-12: 162 pg/mL — ABNORMAL LOW (ref 180–914)

## 2017-10-14 LAB — CBC WITH DIFFERENTIAL (CANCER CENTER ONLY)
Basophils Absolute: 0.1 10*3/uL (ref 0.0–0.1)
Basophils Relative: 0 %
Eosinophils Absolute: 0.2 10*3/uL (ref 0.0–0.5)
Eosinophils Relative: 1 %
HCT: 37.4 % — ABNORMAL LOW (ref 38.4–49.9)
Hemoglobin: 11.8 g/dL — ABNORMAL LOW (ref 13.0–17.1)
Lymphocytes Relative: 69 %
Lymphs Abs: 13.7 10*3/uL — ABNORMAL HIGH (ref 0.9–3.3)
MCH: 26.2 pg — ABNORMAL LOW (ref 27.2–33.4)
MCHC: 31.5 g/dL — ABNORMAL LOW (ref 32.0–36.0)
MCV: 83.1 fL (ref 79.3–98.0)
Monocytes Absolute: 0.7 10*3/uL (ref 0.1–0.9)
Monocytes Relative: 4 %
Neutro Abs: 5.1 10*3/uL (ref 1.5–6.5)
Neutrophils Relative %: 26 %
Platelet Count: 147 10*3/uL (ref 140–400)
RBC: 4.5 MIL/uL (ref 4.20–5.82)
RDW: 17.8 % — ABNORMAL HIGH (ref 11.0–14.6)
WBC Count: 19.7 10*3/uL — ABNORMAL HIGH (ref 4.0–10.3)

## 2017-10-14 LAB — FOLATE: Folate: 10.8 ng/mL (ref >5.9)

## 2017-10-14 LAB — COMPREHENSIVE METABOLIC PANEL
ALT: 19 U/L (ref 0–44)
AST: 19 U/L (ref 15–41)
Albumin: 4 g/dL (ref 3.5–5.0)
Alkaline Phosphatase: 82 U/L (ref 38–126)
Anion gap: 11 (ref 5–15)
BUN: 59 mg/dL — ABNORMAL HIGH (ref 8–23)
CO2: 24 mmol/L (ref 22–32)
Calcium: 9.1 mg/dL (ref 8.9–10.3)
Chloride: 107 mmol/L (ref 98–111)
Creatinine, Ser: 3.77 mg/dL (ref 0.61–1.24)
GFR calc Af Amer: 17 mL/min — ABNORMAL LOW (ref >60)
GFR calc non Af Amer: 14 mL/min — ABNORMAL LOW (ref >60)
Glucose, Bld: 108 mg/dL — ABNORMAL HIGH (ref 70–99)
Potassium: 4.5 mmol/L (ref 3.5–5.1)
Sodium: 142 mmol/L (ref 135–145)
Total Bilirubin: 0.5 mg/dL (ref 0.3–1.2)
Total Protein: 7.6 g/dL (ref 6.5–8.1)

## 2017-10-14 LAB — IRON AND TIBC
Iron: 48 ug/dL (ref 42–163)
Saturation Ratios: 16 % — ABNORMAL LOW (ref 42–163)
TIBC: 305 ug/dL (ref 202–409)
UIBC: 257 ug/dL

## 2017-10-14 LAB — C-REACTIVE PROTEIN: CRP: <0.8 mg/dL (ref <1.0)

## 2017-10-14 LAB — DIRECT ANTIGLOBULIN TEST (NOT AT ARMC)
DAT, IgG: NEGATIVE
DAT, complement: NEGATIVE

## 2017-10-14 LAB — FERRITIN: Ferritin: 59 ng/mL (ref 24–336)

## 2017-10-14 LAB — TSH: TSH: 1.578 u[IU]/mL (ref 0.320–4.118)

## 2017-10-14 LAB — LACTATE DEHYDROGENASE: LDH: 269 U/L — ABNORMAL HIGH (ref 98–192)

## 2017-10-14 NOTE — Progress Notes (Signed)
[facility] Initial Consultation  Patient Name:  Thomas Mcguire  DOB: 1940-12-13   Date of Service: October 14, 2017  Referring Provider: Leanna Battles, [address], [place] [address]   Consulting Physician: Henreitta Leber, MD Hematology/Oncology  Patient Care Team: Patient Care Team: Leanna Battles, MD as PCP - General (Internal Medicine) Ricard Dillon, MD as Consulting Physician (Internal Medicine)   Reason for Referral: In the setting of leukocytosis/lymphocytosis and mild anemia over an indefinite period of time without obvious etiology, he presents now for further diagnostic and therapeutic recommendations.  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  His primary care physician is Dr. Leanna Battles.  He is alone at this first visit.  He denies any recent infection, surgery, intense physical or emotional stress, new medication, or family history of any blood disorder.  On September 13, 2017 a complete blood count showed hemoglobin 10.1 MCV 85.7 MCH 25.6 WBC 19.23; platelets 122,000.  On October 11, 2017 a complete blood count showed hemoglobin 11.1 hematocrit 35.1 MCV 83 MCH 26.3 WBC 20.4 with 25% neutrophils 52% lymphocytes 22% monocytes 1% eosinophil; platelets 142,000.  Serum iron 52 TIBC 263 iron saturation 20% serum ferritin 78 intact PTH 273.  He was seen and evaluated most recently by Dr. Lawson Radar, nephrology, for stage IV chronic kidney injury.  He has  no history of coronary artery disease, angina, or myocardial infarction.  He denies seizure disorder or stroke syndrome.  There is no thyroid disease.  He denies gastroesophageal reflux or peptic ulcer disease.  He has no viral hepatitis, inflammatory bowel disease, or symptomatic diverticulosis.  His most recent colonoscopy was 3 years earlier reportedly normal.  He has chronic swelling of his legs and wears support stockings to reduce the swelling.  He has no prior history of blood disorder, bleeding tendency, or easy bruisability.  He reports no venous thromboembolic disease or peripheral arterial disease.  Thomas Mcguire has no appetite or weight deficit.  He has no rash or itching.  He has no unusual headache, dizziness, lightheadedness, syncope, or near syncopal episodes.  He denies new visual changes or hearing deficit.  He reports no unusual cough, sore throat, or orthopnea.  He denies dyspnea either at rest or on minimal exertion.  There is no pain or difficulty in swallowing.  No fever, shaking chills, sweats, or flulike symptoms are reported.  He has no heartburn or indigestion.  He has no chest or abdominal pain.  There is no nausea, vomiting, diarrhea, or constipation.  He moves his bowels once daily.  He denies melena or bright red blood per rectum.  No urinary frequency, urgency, hematuria, or dysuria are evident.  There are no new focal areas of bone, joint, muscle pain.  He has paresthesias involving the lower extremities consistent with diabetic neuropathy/polyneuropathy.  It is with this background he presents now for further diagnostic and therapeutic recommendations in the setting of leukocytosis without obvious etiology.  Past Medical History:  Diagnosis Date  . Arthritis   . Chronic renal insufficiency    CHECKED Q3MOS PER PT. NO TREATMENT  . Diabetes  mellitus   . Gout    Has not had recently- 08/24/11  . Hyperlipidemia   . Hypertension   . Neuromuscular disorder ([place])    Neuropathy    Past Surgical History:  Procedure Laterality Date  . AMPUTATION  08/27/2011   Procedure: AMPUTATION DIGIT;  Surgeon: Newt Minion, MD;  Location: [facility];  Service: Orthopedics;  Laterality: Left;  Left Foot 3rd toe Amputation MTP joint  . amputation great toe  2009   left  . COLONOSCOPY    . cyst elbow  1999   right  . TOE AMPUTATION  2010   left 2nd toe  Bilateral ligament repair 12 years ago Right rotator cuff surgery 3 years ago  Family History  Problem Relation Age of Onset  . Colon cancer Neg Hx   Mother: Deceased: Age 13 years: Diabetes mellitus. Father: Deceased: Age 16 years: Diabetes mellitus. He has no brothers or sisters.  Social History   Socioeconomic History  . Marital status: Married    Spouse name: Thomas Mcguire  . Number of children: Not on file  . Years of education: Not on file  . Highest education level: Not on file  Occupational History  . Not on file  Social Needs  . Financial resource strain: Not on file  . Food insecurity:    Worry: Not on file    Inability: Not on file  . Transportation needs:    Medical: Not on file    Non-medical: Not on file  Tobacco Use  . Smoking status: Former Smoker    Last attempt to quit: 03/11/1996    Years since quitting: 21.6  . Smokeless tobacco: Never Used  Substance and Sexual Activity  . Alcohol use: No  . Drug use: No  . Sexual activity: Not Currently  Lifestyle  . Physical activity:    Days per week: Not on file    Minutes per session: Not on file  . Stress: Not on file  Relationships  . Social connections:    Talks on phone: Not on file    Gets together: Not on file    Attends religious service: Not on file    Active member of club or organization: Not on file    Attends meetings of clubs or organizations: Not on file    Relationship status: Not on file  . Intimate partner violence:    Fear of current or ex partner: Not on file    Emotionally abused: Not on file    Physically abused: Not on file     Forced sexual activity: Not on file  Other Topics Concern  . Not on file  Social History Narrative  . Not on file  He is a retired Administrator. He has been married for the past 30 years (second marriage). He has 1 child without major medical problems at age 73 years. He began smoking at the age of 6 years. He smoked 1 pack of cigarettes daily but stopped at age 86 years. He has no significant use of pipe, cigars, or chewing tobacco. His alcohol intake is infrequent in the past, never heavy. He has not consumed any alcoholic beverages since [age]. He reports no recreational drug use.  Transfusion History: No prior transfusion  Exposure History: There is no known exposure to toxic chemicals, radiation, or pesticides.  Allergies:  He has no known medical allergies He has no food or seasonal allergies  Current Outpatient Medications on File Prior to Visit  Medication Sig  .  allopurinol (ZYLOPRIM) 300 MG tablet Take 300 mg by mouth daily.    Marland Kitchen aspirin 325 MG tablet Take 325 mg by mouth daily.    . carvedilol (COREG) 25 MG tablet Take 25 mg by mouth 2 (two) times daily with a meal.  . COD LIVER OIL PO Take 1 capsule by mouth daily.   . furosemide (LASIX) 40 MG tablet Take 80 mg by mouth daily.   Marland Kitchen guanFACINE (TENEX) 2 MG tablet Take 2 mg by mouth every morning.   . insulin NPH (HUMULIN N,NOVOLIN N) 100 UNIT/ML injection Inject 50 Units into the skin 2 (two) times daily. Inject 50 units in am and 50 units in pm  . insulin regular (HUMULIN R,NOVOLIN R) 100 units/mL injection Inject 22 Units into the skin 3 (three) times daily before meals.   . pravastatin (PRAVACHOL) 40 MG tablet Take 40 mg by mouth daily.    Marland Kitchen doxazosin (CARDURA) 2 MG tablet Take 2 mg by mouth at bedtime.  Marland Kitchen HYDROcodone-acetaminophen (VICODIN) 5-500 MG per tablet Take 1-2 tablets by mouth daily as needed. For pain  . lisinopril (PRINIVIL,ZESTRIL) 20 MG tablet Take 10 mg by mouth 2 (two) times daily.    No current  facility-administered medications on file prior to visit.     Review of Systems: Constitutional: No fever, sweats, or shaking chills.  No appetite or weight deficit. Skin: No rash, scaling, sores, lumps, or jaundice. HEENT: No visual changes or hearing deficit.  No allergic rhinitis or sinusitis. Pulmonary: No unusual cough, sore throat, or orthopnea.  No DOE or COPD. Cardiovascular: No coronary artery disease, angina, or myocardial infarction.  No cardiac dysrhythmia, essential hypertension, or dyslipidemia. Gastrointestinal: No indigestion, dysphagia, abdominal pain, diarrhea, or constipation.  No change in bowel habits.  No nausea or vomiting.  No melena or bright red blood per rectum. Genitourinary: No urinary frequency, urgency, hematuria, or dysuria; early stage prostate cancer on active surveillance. Musculoskeletal: No arthralgias or myalgias; no joint swelling, pain, or instability. Hematologic: No bleeding tendency or easy bruisability. Endocrine: No intolerance to heat or cold; no thyroid disease; insulin requiring diabetes mellitus. Vascular: No peripheral arterial or venous thromboembolic disease. Psychological: No anxiety, depression, or mood changes; no mental health illnesses. Neurological: No dizziness, lightheadedness, syncope, or near syncopal episodes; paresthesia and tense edema involving the lower extremities.  Physical Examination: Vital Signs: Body surface area is 2.52 meters squared.  Vitals:   10/14/17 0851 10/14/17 0854  BP: (!) 172/69 (!) 166/59  Pulse: (!) 59   Resp: 18   Temp: 98.1 F (36.7 C)   SpO2: 100%   BP 153/64 (repeat)   HR 55   WT 254   Ht 71.1"    BSA 2.43 m    BMI 31.71     ECOG PERFORMANCE STATUS: 1 Constitutional:  Thomas Mcguire is a fully nourished and developed African-American.  He looks age appropriate.  He is friendly and cooperative without respiratory compromise at rest. Skin: No rashes, scaling, dryness, jaundice, or itching. HEENT:  Head is normocephalic and atraumatic.  Pupils are equal round and reactive to light and accommodation.  Sclerae are anicteric.  Conjunctivae are pale.  Bilateral arcus senilis.  No sinus tenderness nor oropharyngeal lesions.  Lips without cracking or peeling; tongue without mass, inflammation, or nodularity.  Mucous membranes are moist.  He has dentures. Neck: Supple and symmetric.  No jugular venous distention or thyromegaly.  Trachea is midline. Lymphatics: No cervical or supraclavicular lymphadenopathy.  No epitrochlear, axillary, or  inguinal lymphadenopathy is appreciated. Respiratory/chest: Thorax is symmetrical.  Breath sounds are clear to auscultation and percussion.  Normal excursion and respiratory effort. Back: Symmetric without deformity or tenderness. Cardiovascular: Heart rate and rhythm are regular without murmurs, gallops, or rubs. Gastrointestinal: Abdomen is soft, nontender; no organomegaly.  Bowel sounds are normoactive.  No masses are appreciated. Genitourinary: Normal external male genitalia. Rectal examination: Not performed. Extremities: In the lower extremities, there is no asymmetric swelling, erythema, tenderness, or cord formation.  He has bilateral tense pitting edema (wearing support stockings).  No clubbing or cyanosis, Hematologic: No petechiae, hematomas, or ecchymoses. Psychological:  He is oriented to person, place, and time; normal affect. Neurological: There are no gross neurologic deficits.  Laboratory Results: I have reviewed the data as listed:  Ref Range & Units 10:37 64yr ago 20yr ago  WBC Count 4.0 - 10.3 K/uL 19.7High   11.0High  R 10.7High  R  RBC 4.20 - 5.82 MIL/uL 4.50  4.49 R 4.37 R  Hemoglobin 13.0 - 17.1 g/dL 11.8Low   12.2Low  R 12.0Low  R  HCT 38.4 - 49.9 % 37.4Low   37.6Low  R 37.0Low  R  MCV 79.3 - 98.0 fL 83.1  83.7 R 84.6 R  MCH 27.2 - 33.4 pg 26.2Low   27.2 R   MCHC 32.0 - 36.0 g/dL 31.5Low   32.4 R 32.6 R  RDW 11.0 - 14.6 % 17.8High    16.1High  R 15.6High  R  Platelet Count 140 - 400 K/uL 147  134Low  R 188 R  Smear Review  Few shistocytes     Neutrophils Relative % % 26     Neutro Abs 1.5 - 6.5 K/uL 5.1     Lymphocytes Relative % 69     Lymphs Abs 0.9 - 3.3 K/uL 13.7High      Monocytes Relative % 4     Monocytes Absolute 0.1 - 0.9 K/uL 0.7     Eosinophils Relative % 1     Eosinophils Absolute 0.0 - 0.5 K/uL 0.2     Basophils Relative % 0     Basophils Absolute 0.0 - 0.1 K/uL       Ref Range & Units 10:37 43yr ago 83yr ago  Sodium 135 - 145 mmol/L 142  142 R 136 R  Potassium 3.5 - 5.1 mmol/L 4.5  4.6 R 3.9 R  Chloride 98 - 111 mmol/L 107  105 R 101 R  CO2 22 - 32 mmol/L 24  29 R 28 R  Glucose, Bld 70 - 99 mg/dL 108High   143High   92 R  BUN 8 - 23 mg/dL 59High   37High  R 31High  R  Creatinine, Ser 0.61 - 1.24 mg/dL 3.77High Panic   1.66High  R 1.41 R  Comment: CRITICAL RESULT CALLED TO, READ BACK BY AND VERIFIED WITH: Dr. Ladona Ridgel at 1146. RQ   Calcium 8.9 - 10.3 mg/dL 9.1  9.2 R 8.7 R  Total Protein 6.5 - 8.1 g/dL 7.6  6.7 R   Albumin 3.5 - 5.0 g/dL 4.0  3.5 R   AST 15 - 41 U/L 19  18 R   ALT 0 - 44 U/L 19  16 R   Alkaline Phosphatase 38 - 126 U/L 82  59 R   Total Bilirubin 0.3 - 1.2 mg/dL 0.5  0.4    GFR calc non Af Amer >60 mL/min 14Low   40Low  R 50Low  R  GFR calc Af Amer >  60 mL/min 17Low   47Low  R,        Summary/Assessment: In the setting of leukocytosis/lymphocytosis and mild anemia over an indefinite period of time without obvious etiology, he presents now for further diagnostic and therapeutic recommendations.  He denies any recent infection, surgery, intense physical or emotional stress, new medication, or family history of any blood disorder.  Limited records and labs were available for review at the time of presentation.  On September 13, 2017 a complete blood count showed hemoglobin 10.1 MCV 85.7 MCH 25.6 WBC 19.23; platelets 122,000.  On October 11, 2017 a complete blood count showed  hemoglobin 11.1 hematocrit 35.1 MCV 83 MCH 26.3 WBC 20.4 with 25% neutrophils 52% lymphocytes 22% monocytes 1% eosinophil; platelets 142,000.  Serum iron 52 TIBC 263 iron saturation 20% serum ferritin 78 intact PTH 273.  He was seen and evaluated most recently by Dr. Lawson Radar, nephrology, for stage IV chronic kidney injury.  He has no history of coronary artery disease, angina, or myocardial infarction.  He denies seizure disorder or stroke syndrome.  There is no thyroid disease.  He denies gastroesophageal reflux or peptic ulcer disease.  He has no viral hepatitis, inflammatory bowel disease, or symptomatic diverticulosis.  His most recent colonoscopy was 3 years earlier reportedly normal.  He has chronic swelling of his legs and wears support stockings to reduce the swelling.  He has no prior history of blood disorder, bleeding tendency, or easy bruisability.  He reports no venous thromboembolic disease or peripheral arterial disease.  Thomas Mcguire has no appetite or weight deficit.  He has no rash or itching.  He has no unusual headache, dizziness, lightheadedness, syncope, or near syncopal episodes.  He denies new visual changes or hearing deficit.  He reports no unusual cough, sore throat, or orthopnea.  He denies dyspnea either at rest or on minimal exertion.  There is no pain or difficulty in swallowing.  No fever, shaking chills, sweats, or flulike symptoms are reported.  He has no heartburn or indigestion.  He has no chest or abdominal pain.  There is no nausea, vomiting, diarrhea, or constipation.  He moves his bowels once daily.  He denies melena or bright red blood per rectum.  No urinary frequency, urgency, hematuria, or dysuria are evident.  There are no new focal areas of bone, joint, muscle pain.  He has paresthesias involving the lower extremities consistent with diabetic neuropathy/polyneuropathy.    Recommendation/Plan: We discussed in detail the results of his most recent laboratory studies  from early October which revealed leukocytosis/lymphocytosis, anemia, and a transiently low platelet count.  A battery of laboratory studies were obtained to exclude an underlying myeloproliferative disease, paraproteinemia, metabolic anomaly, iron deficiency, hemolytic phenomenon, nutritional deficiency, infectious process, or hemoglobinopathy.  Those results were not available at the time of discharge.  His peripheral blood smear will be reviewed officially by hematopathology.  If indeed he does have small mature lymphocytes and smudge cells in his peripheral blood smear, then peripheral blood for immunophenotypic flow cytometry will be obtained to exclude an underlying lymphoproliferative disorder such as B-cell chronic lymphocytic leukemia. On physical examination there is no peripheral lymphadenopathy identified.  Barring any unforeseen complications, his next scheduled doctor visit is on October 23 to discuss the above results with recommendations.  He was advised to call us in the interim should any new or untoward problems arise.  The total time spent discussing the prior laboratory studies, methodology for evaluating leukocytosis/lymphocytosis,  preliminary considerations with recommendations  was 60 minutes.  At least 50% of that time was spent in discussion, reviewing outside records, laboratory evaluation, counseling, and answering questions. All questions were answered to his satisfaction.  He knows to call the Clinic with any problems, questions, or concerns.  This note was dictated using voice activated technology/software.  Unfortunately, typographical errors are not uncommon, and transcription is subject to mistakes and regrettably misinterpretation.  If necessary, clarification of the above information can be discussed with me at any time.  Thank you Dr. Philip Aspen for allowing my participation in the care of Thomas Mcguire. I will keep you closely informed as the results of his  preliminary laboratory data become available.  Please do not hesitate to call should any questions arise regarding this initial consultation and discussion.  FOLLOW UP: AS DIRECTED   cc:      Leanna Battles, MD            Lawson Radar, MD  Henreitta Leber, MD  Hematology/Oncology [facility] [address] [place], [place] [address] Office: [phone] CMKL: [phone]

## 2017-10-14 NOTE — Telephone Encounter (Signed)
Appts scheduled avs/calendar printed per 10/4 los °

## 2017-10-14 NOTE — Telephone Encounter (Signed)
Called [facility] for medical records on pt.  Received fax with most recent visit information for MD [facility].  Called [facility] for medical records on pt.  Received fax with most recent visit information for MD [facility].

## 2017-10-14 NOTE — Patient Instructions (Signed)
Thomas Mcguire October 14, 2017  We discussed in detail the results of his most recent laboratory studies from early October which reveal an elevated white blood cell count, anemia (low hemoglobin), and a low platelet count.  Laboratory studies were obtained today to identify a primary versus a reactive explanation for the above abnormalities.  Those studies are to exclude an underlying iron deficit, abnormal protein in the blood, underlying bone marrow disorder, nutritional deficiency, premature destruction of cells, decreased red blood cell production, metabolic abnormality, chronic infection, or chronic inflammatory condition.   The results of those laboratory studies will be discussed in detail at the time of your next visit on October 23.  Please do not hesitate to call if any questions arise in the interim.  Thank you!  Ladona Ridgel, MD Hematology/Oncology [phone]

## 2017-10-15 LAB — ERYTHROPOIETIN: Erythropoietin: 9.1 m[IU]/mL (ref 2.6–18.5)

## 2017-10-15 LAB — IGG, IGA, IGM
IgA: 163 mg/dL (ref 61–437)
IgG (Immunoglobin G), Serum: 1094 mg/dL (ref 700–1600)
IgM (Immunoglobulin M), Srm: 27 mg/dL (ref 15–143)

## 2017-10-15 LAB — HAPTOGLOBIN: Haptoglobin: 96 mg/dL (ref 34–200)

## 2017-10-15 LAB — HEPATITIS C ANTIBODY: HCV Ab: <0.1 s/co ratio (ref 0.0–0.9)

## 2017-10-17 ENCOUNTER — Telehealth: Payer: Self-pay | Admitting: Emergency Medicine

## 2017-10-17 LAB — PROTEIN ELECTROPHORESIS, SERUM
A/G Ratio: 1.3 (ref 0.7–1.7)
Albumin ELP: 3.9 g/dL (ref 2.9–4.4)
Alpha-1-Globulin: 0.2 g/dL (ref 0.0–0.4)
Alpha-2-Globulin: 0.8 g/dL (ref 0.4–1.0)
Beta Globulin: 0.9 g/dL (ref 0.7–1.3)
Gamma Globulin: 1 g/dL (ref 0.4–1.8)
Globulin, Total: 2.9 g/dL (ref 2.2–3.9)
M-Spike, %: 0.4 g/dL — ABNORMAL HIGH
Total Protein ELP: 6.8 g/dL (ref 6.0–8.5)

## 2017-10-17 LAB — HEMOGLOBINOPATHY EVALUATION
Hgb A2 Quant: 2.2 % (ref 1.8–3.2)
Hgb A: 97.8 % (ref 96.4–98.8)
Hgb C: 0 %
Hgb F Quant: 0 % (ref 0.0–2.0)
Hgb S Quant: 0 %
Hgb Variant: 0 %

## 2017-10-17 LAB — IMMUNOFIXATION ELECTROPHORESIS
IgA: 165 mg/dL (ref 61–437)
IgG (Immunoglobin G), Serum: 1089 mg/dL (ref 700–1600)
IgM (Immunoglobulin M), Srm: 28 mg/dL (ref 15–143)
Total Protein ELP: 6.8 g/dL (ref 6.0–8.5)

## 2017-10-17 LAB — KAPPA/LAMBDA LIGHT CHAINS
Kappa free light chain: 137.1 mg/L — ABNORMAL HIGH (ref 3.3–19.4)
Kappa, lambda light chain ratio: 2.64 — ABNORMAL HIGH (ref 0.26–1.65)
Lambda free light chains: 52 mg/L — ABNORMAL HIGH (ref 5.7–26.3)

## 2017-10-17 LAB — PATHOLOGIST SMEAR REVIEW

## 2017-10-17 NOTE — Telephone Encounter (Signed)
Faxed 10/14/17 visit information for MD Audelia Hives to MD Philip Aspen and MD Carolin Sicks.  All faxes received.

## 2017-10-18 ENCOUNTER — Encounter: Payer: Self-pay | Admitting: Hematology and Oncology

## 2017-10-18 LAB — LEUKOCYTE ALKALINE PHOSPHATASE: Leukocyte Alkaline  Phos Stain: 21 — ABNORMAL LOW (ref 25–130)

## 2017-10-18 NOTE — Progress Notes (Unsigned)
Please excuse

## 2017-10-25 ENCOUNTER — Ambulatory Visit
Admission: RE | Admit: 2017-10-25 | Discharge: 2017-10-25 | Disposition: A | Discharge status: Home / Self Care | Payer: Medicare Other | Source: Ambulatory Visit | Attending: Nephrology | Admitting: Nephrology

## 2017-10-25 DIAGNOSIS — N184 Chronic kidney disease, stage 4 (severe): Secondary | ICD-10-CM

## 2017-10-25 DIAGNOSIS — I129 Hypertensive chronic kidney disease with stage 1 through stage 4 chronic kidney disease, or unspecified chronic kidney disease: Secondary | ICD-10-CM

## 2017-10-28 LAB — BCR ABL1 FISH (GENPATH)

## 2017-11-01 ENCOUNTER — Other Ambulatory Visit: Payer: Self-pay | Admitting: Urology

## 2017-11-01 DIAGNOSIS — C61 Malignant neoplasm of prostate: Secondary | ICD-10-CM

## 2017-11-01 NOTE — Progress Notes (Signed)
[facility]ology/Oncology Outpatient Progress Note  Patient Name:  Laney Bagshaw  DOB: 1940-11-09   Date of Service: November 02, 2017  Referring Provider: Leanna Battles, MD  [address] [place], [place] [address]   Consulting Physician: Henreitta Leber, MD Hematology/Oncology  Reason for Visit: In the setting of leukocytosis/lymphocytosis and mild anemia over an indefinite period of time without obvious etiology, he presents now to discuss the results of his preliminary evaluation with recommendations.  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  His primary care physician is Dr. Leanna Battles.  He is alone at this visit.  He denies any recent infection, surgery, intense physical or emotional stress, new medication, or family history of any blood disorder.  On September 13, 2017 a complete blood count showed hemoglobin 10.1 MCV 85.7 MCH 25.6 WBC 19.23; platelets 122,000.  On October 11, 2017 a complete blood count showed hemoglobin 11.1 hematocrit 35.1 MCV 83 MCH 26.3 WBC 20.4 with 25% neutrophils 52% lymphocytes 22% monocytes 1% eosinophil; platelets 142,000.  Serum iron 52 TIBC 263 iron saturation 20% serum ferritin 78 intact PTH 273.  He was seen and evaluated most recently by Dr. Lawson Radar, nephrology, for stage IV chronic kidney injury. His most recent colonoscopy was 3 years earlier reportedly normal.  He has chronic swelling of his legs and wears support stockings to reduce the swelling.   At the time of  his initial visit on October 4, we discussed in detail the results of his most recent laboratory studies from October which revealed leukocytosis/lymphocytosis, anemia, and a transiently low platelet count.  A battery of laboratory studies were obtained to exclude an underlying myeloproliferative disease, lymphoproliferative disorder, paraproteinemia, metabolic anomaly, iron deficiency, hemolytic phenomenon, nutritional deficiency, infectious process, or hemoglobinopathy. It is with this background he presents now for further diagnostic and therapeutic recommendations in the setting of leukocytosis/lymphocytosis as outlined above.  Interval History: In the interim since he denies any appetite or weight deficit.  He has no rash or itching.  He has no unusual headache, dizziness, lightheadedness, syncope, or near syncopal episodes.  He denies new visual changes or hearing deficit.  He reports no unusual cough, sore throat, or orthopnea.  He denies dyspnea either at rest or on minimal exertion.  There is no pain or difficulty in swallowing.  No fever, shaking chills, sweats, or flulike symptoms are reported.  He has no heartburn or indigestion.  He has no chest or abdominal pain. There is no nausea, vomiting, diarrhea, or constipation.  He moves his bowels once daily.  He denies melena or bright red blood per rectum.  No urinary frequency, urgency, hematuria, or dysuria are evident. There are no new focal areas of bone, joint, muscle pain.  He has paresthesias involving the lower extremities consistent with diabetic neuropathy/polyneuropathy.    Past Medical History Reviewed        Family History Reviewed       Social History Reviewed  Past Medical History:  Diagnosis Date  . Arthritis   . Chronic renal insufficiency    CHECKED Q3MOS PER PT. NO TREATMENT  . Diabetes mellitus   .  Gout    Has not had recently- 08/24/11  . Hyperlipidemia   . Hypertension   . Neuromuscular disorder ([place])     Neuropathy   Allergies:  He has no known medical allergies He has no food or seasonal allergies  Current Outpatient Medications on File Prior to Visit  Medication Sig  . allopurinol (ZYLOPRIM) 300 MG tablet Take 300 mg by mouth daily.    Marland Kitchen aspirin 325 MG tablet Take 325 mg by mouth daily.    . carvedilol (COREG) 25 MG tablet Take 25 mg by mouth 2 (two) times daily with a meal.  . COD LIVER OIL PO Take 1 capsule by mouth daily.   Marland Kitchen doxazosin (CARDURA) 2 MG tablet Take 2 mg by mouth at bedtime.  . furosemide (LASIX) 40 MG tablet Take 80 mg by mouth daily.   Marland Kitchen HYDROcodone-acetaminophen (VICODIN) 5-500 MG per tablet Take 1-2 tablets by mouth daily as needed. For pain  . insulin NPH (HUMULIN N,NOVOLIN N) 100 UNIT/ML injection Inject 50 Units into the skin 2 (two) times daily. Inject 50 units in am and 50 units in pm  . insulin regular (HUMULIN R,NOVOLIN R) 100 units/mL injection Inject 22 Units into the skin 3 (three) times daily before meals.   . pravastatin (PRAVACHOL) 40 MG tablet Take 40 mg by mouth daily.    Marland Kitchen guanFACINE (TENEX) 2 MG tablet Take 2 mg by mouth every morning.    No current facility-administered medications on file prior to visit.     Review of Systems: Constitutional: No fever, sweats, or shaking chills.  No appetite or weight deficit. Skin: No rash, scaling, sores, lumps, or jaundice. HEENT: No visual changes or hearing deficit.  No allergic rhinitis or sinusitis. Pulmonary: No unusual cough, sore throat, or orthopnea.  No DOE or COPD. Cardiovascular: No coronary artery disease, angina, or myocardial infarction.  No cardiac dysrhythmia, essential hypertension, or dyslipidemia. Gastrointestinal: No indigestion, dysphagia, abdominal pain, diarrhea, or constipation.  No change in bowel habits.  No nausea or vomiting.  No melena or bright red blood per rectum. Genitourinary: No urinary frequency, urgency, hematuria, or dysuria; early stage prostate cancer on active  surveillance. Musculoskeletal: No arthralgias or myalgias; no joint swelling, pain, or instability. Hematologic: No bleeding tendency or easy bruisability. Endocrine: No intolerance to heat or cold; no thyroid disease; insulin requiring diabetes mellitus. Vascular: No peripheral arterial or venous thromboembolic disease. Psychological: No anxiety, depression, or mood changes; no mental health illnesses. Neurological: No dizziness, lightheadedness, syncope, or near syncopal episodes; paresthesia and tense edema involving the lower extremities.  Physical Examination: Vital Signs: Body surface area is 2.59 meters squared.  Vitals:   11/02/17 0828  BP: (!) 151/56  Pulse: 61  Resp: 17  Temp: 98.4 F (36.9 C)  SpO2: 100%    Filed Weights   11/02/17 0828  Weight: 268 lb 14.4 oz (122 kg)  Body mass index is 31.07 kg/m. ECOG PERFORMANCE STATUS: 1 Constitutional:  Halton Neas is a fully nourished and developed African-American.  He looks age appropriate.  He is friendly and cooperative without respiratory compromise at rest. Skin: No rashes, scaling, dryness, jaundice, or itching. HEENT: Head is normocephalic and atraumatic.  Pupils are equal round and reactive to light and accommodation.  Sclerae are anicteric.  Conjunctivae are pale.  Bilateral arcus senilis.  No sinus tenderness nor oropharyngeal lesions.  Lips without cracking or peeling; tongue without mass, inflammation, or nodularity.  Mucous membranes are moist.  He has dentures. Neck: Supple  and symmetric.  No jugular venous distention or thyromegaly.  Trachea is midline. Lymphatics: No cervical or supraclavicular lymphadenopathy.  No epitrochlear, axillary, or inguinal lymphadenopathy is appreciated. Respiratory/chest: Thorax is symmetrical.  Breath sounds are clear to auscultation and percussion.  Normal excursion and respiratory effort. Back: Symmetric without deformity or tenderness. Cardiovascular: Heart rate and rhythm are  regular without murmurs, gallops, or rubs. Gastrointestinal: Abdomen is soft, nontender; no organomegaly.  Bowel sounds are normoactive.  No masses are appreciated. Genitourinary: Normal external male genitalia. Rectal examination: Not performed. Extremities: In the lower extremities, there is no asymmetric swelling, erythema, tenderness, or cord formation.  He has bilateral tense pitting edema (wearing support stockings).  No clubbing or cyanosis, Hematologic: No petechiae, hematomas, or ecchymoses. Psychological:  He is oriented to person, place, and time; normal affect. Neurological: There are no gross neurologic deficits.  Laboratory Results: October 14, 2017  Ref Range & Units 2wk ago 52yr ago 86yr ago  WBC Count 4.0 - 10.3 K/uL 19.7High   11.0High  R 10.7High  R  RBC 4.20 - 5.82 MIL/uL 4.50  4.49 R 4.37 R  Hemoglobin 13.0 - 17.1 g/dL 11.8Low   12.2Low  R 12.0Low  R  HCT 38.4 - 49.9 % 37.4Low   37.6Low  R 37.0Low  R  MCV 79.3 - 98.0 fL 83.1  83.7 R 84.6 R  MCH 27.2 - 33.4 pg 26.2Low   27.2 R   MCHC 32.0 - 36.0 g/dL 31.5Low   32.4 R 32.6 R  RDW 11.0 - 14.6 % 17.8High   16.1High  R 15.6High  R  Platelet Count 140 - 400 K/uL 147  134Low  R 188 R  Smear Review  Few shistocytes     Neutrophils Relative % % 26     Neutro Abs 1.5 - 6.5 K/uL 5.1     Lymphocytes Relative % 69     Lymphs Abs 0.9 - 3.3 K/uL 13.7High      Monocytes Relative % 4     Monocytes Absolute 0.1 - 0.9 K/uL 0.7     Eosinophils Relative % 1     Eosinophils Absolute 0.0 - 0.5 K/uL 0.2     Basophils Relative % 0     Basophils Absolute 0.0 - 0.1 K/uL 0.1       Ref Range & Units 2wk ago 39yr ago 62yr ago  Sodium 135 - 145 mmol/L 142  142 R 136 R  Potassium 3.5 - 5.1 mmol/L 4.5  4.6 R 3.9 R  Chloride 98 - 111 mmol/L 107  105 R 101 R  CO2 22 - 32 mmol/L 24  29 R 28 R  Glucose, Bld 70 - 99 mg/dL 108High   143High   92 R  BUN 8 - 23 mg/dL 59High   37High  R 31High  R  Creatinine, Ser 0.61 - 1.24 mg/dL 3.77High Panic    1.66High  R 1.41 R  Comment: CRITICAL RESULT CALLED TO, READ BACK BY AND VERIFIED WITH: Dr. Ladona Ridgel at 1146. RQ   Calcium 8.9 - 10.3 mg/dL 9.1  9.2 R 8.7 R  Total Protein 6.5 - 8.1 g/dL 7.6  6.7 R   Albumin 3.5 - 5.0 g/dL 4.0  3.5 R   AST 15 - 41 U/L 19  18 R   ALT 0 - 44 U/L 19  16 R   Alkaline Phosphatase 38 - 126 U/L 82  59 R   Total Bilirubin 0.3 - 1.2 mg/dL 0.5  0.4  GFR calc non Af Amer >60 mL/min 14Low   40Low  R 50Low  R  GFR calc Af Amer >60 mL/min 17Low      Reticulocyte count 1% Erythropoietin 9.1 LDH 269 Haptoglobin 96 Hemoglobin electrophoresis: Normal adult hemoglobin Vitamin B12 162 (180-914) BCR-ABL 1 balance translocation: Not detected Serum protein electrophoresis, immunofixation, and light chain analysis: There is a faint band in the gamma region suspicious for a monoclonal immunoglobulin. Quantitative immunoglobulins:  IgG 1089 IgA 165 IgM 28 Free kappa light chain 137.1 Free lambda light chain 52.0 Free kappa/lambda light chain ratio: 2.64 (0.26-1.65). Component 2wk ago  Path Review Reviewed by Arrie Aran L. Melina Copa, M.D.   Comment: 10.4.19  ABSOLUTE LYMPHOCYTOSIS AND NORMOCYTIC ANEMIA. FLOW CYTOMETRY PENDING.  Performed at [facility], [place] [address] [place], [place] [address]   Hepatitis C antibody: <0.1: Negative.  Summary/Assessment: In the setting of leukocytosis/lymphocytosis and mild anemia over an indefinite period of time without obvious etiology, he presents now to discuss the results of his preliminary evaluation with recommendations.  On September 13, 2017 a complete blood count showed hemoglobin 10.1 MCV 85.7 MCH 25.6 WBC 19.23; platelets 122,000.  On October 11, 2017 a complete blood count showed hemoglobin 11.1 hematocrit 35.1 MCV 83 MCH 26.3 WBC 20.4 with 25% neutrophils 52% lymphocytes 22% monocytes 1% eosinophil; platelets 142,000.  Serum iron 52 TIBC 263 iron saturation 20% serum ferritin 78 intact PTH 273.  He was seen  and evaluated most recently by Dr. Lawson Radar, nephrology, for stage IV chronic kidney injury. His most recent colonoscopy was 3 years earlier reportedly normal. He denies any recent infection, surgery, intense physical or emotional stress, new medication, or family history of any blood disorder. He has chronic swelling of his legs and wears support stockings to reduce the swelling.   At the time of his initial visit on October 4, we discussed in detail the results of his most recent laboratory studies from October which revealed leukocytosis/lymphocytosis, anemia, and a transiently low platelet count.  A battery of laboratory studies were obtained to exclude an underlying myeloproliferative disease, paraproteinemia, metabolic anomaly, iron deficiency, hemolytic phenomenon, nutritional deficiency, infectious process, or hemoglobinopathy.  Those results are detailed below.  Since his last visit, he was started on sodium bicarbonate twice daily and Calcitrol.    In the interim since he denies any appetite or weight deficit.  He has no rash or itching.  He has no unusual headache, dizziness, lightheadedness, syncope, or near syncopal episodes.  He denies new visual changes or hearing deficit.  He reports no unusual cough, sore throat, or orthopnea.  He denies dyspnea either at rest or on minimal exertion.  There is no pain or difficulty in swallowing.  No fever, shaking chills, sweats, or flulike symptoms are reported.  He has no heartburn or indigestion.  He has no chest or abdominal pain. There is no nausea, vomiting, diarrhea, or constipation.  He moves his bowels once daily.  He denies melena or bright red blood per rectum.  No urinary frequency, urgency, hematuria, or dysuria are evident. There are no new focal areas of bone, joint, muscle pain.  He has paresthesias involving the lower extremities consistent with diabetic neuropathy/polyneuropathy.   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   Recommendation/Plan: We discussed in detail the results of his laboratory studies from October 4.  The results of his complete blood count show a persistently elevated white blood cell count with lymphocytic shift.  He is mildly anemic, 11.8 g/dL.  His platelet count is normal 147,000.  There was a faint band in the gamma region of  serum protein electrophoresis.  His immunofixation pattern however was normal.  His quantitative immunoglobulins were normal.  The kappa/lambda light chain analysis showed a combined elevation of both kappa and lambda free light chains.  The kappa/lambda light chain ratio was elevated additionally.  vitamin B12 was low 162 (180-914).  The folic acid levels was normal.  A hemoglobin electrophoresis revealed a normal adult hemoglobin pattern. There was no evidence of hemolysis. His serum creatinine was 3.77.  Those results are detailed above.  His laboratory studies from today were discussed with Dr. Carolin Sicks, nephrology.  Apparently, his baseline creatinine is 4.0.  He has stage IV renal disease.  His liver function and electrolytes were normal.  A serum erythropoietin level was 9.1.  His iron studies were somewhat difficult to interpret since ferritin is an acute phase reactant.  Ferritin was 59.  Iron 48 TIBC 305 iron saturation 16%.  This is more consistent with poor iron utilization as in chronic disease as opposed to iron deficiency.  Because his vitamin B12 level was low, the metabolites of vitamin E56 and folic acid, methylmalonic acid and serum homocystine have been requested.  Those results are pending.  The results of  immunophenotypic flow cytometry on peripheral blood is pending.  If an underlying lymphoproliferative disorder such as B-cell CLL is not verified by flow cytometry, then a bone marrow aspiration biopsy is indicated.  A repeat complete blood count and comprehensive metabolic panel were requested for today.  Barring any unforeseen complications, his next scheduled doctor visit is on November 4 to discuss the results of today's laboratory studies and recommendations.  Vasiliy was advised to call us in the interim should any new or untoward problems arise.  The total time spent discussing the detailed results of the above laboratory evaluation, importance of obtaining flow cytometry from peripheral blood, and the implications of possible vitamin B12 deficiency was 30 minutes. At least 50% of that time was spent in face-to-face discussion, reviewing laboratory evaluation, counseling, and answering questions.  This note was dictated using voice activated technology/software.  Unfortunately, typographical errors are not uncommon, and transcription is subject to mistakes and regrettably misinterpretation.  If necessary, clarification of the above information can be discussed with me at any time.  FOLLOW UP: AS DIRECTED   cc:      Leanna Battles, MD            Lawson Radar, MD    Henreitta Leber, MD  Hematology/Oncology [facility] [address] [place], [place] [address] Office: [phone] OYDX: [phone]

## 2017-11-02 ENCOUNTER — Inpatient Hospital Stay: Payer: Medicare Other | Admitting: Hematology and Oncology

## 2017-11-02 ENCOUNTER — Telehealth: Payer: Self-pay | Admitting: Hematology and Oncology

## 2017-11-02 ENCOUNTER — Encounter: Payer: Self-pay | Admitting: Hematology and Oncology

## 2017-11-02 ENCOUNTER — Inpatient Hospital Stay: Payer: Medicare Other

## 2017-11-02 ENCOUNTER — Ambulatory Visit: Payer: Medicare Other

## 2017-11-02 VITALS — BP 151/56 | HR 61 | Temp 98.4°F | Resp 17 | Ht 78.0 in | Wt 268.9 lb

## 2017-11-02 DIAGNOSIS — D72820 Lymphocytosis (symptomatic): Secondary | ICD-10-CM

## 2017-11-02 DIAGNOSIS — E538 Deficiency of other specified B group vitamins: Secondary | ICD-10-CM | POA: Diagnosis not present

## 2017-11-02 DIAGNOSIS — R894 Abnormal immunological findings in specimens from other organs, systems and tissues: Secondary | ICD-10-CM | POA: Diagnosis not present

## 2017-11-02 DIAGNOSIS — D649 Anemia, unspecified: Secondary | ICD-10-CM | POA: Diagnosis not present

## 2017-11-02 DIAGNOSIS — N184 Chronic kidney disease, stage 4 (severe): Secondary | ICD-10-CM

## 2017-11-02 DIAGNOSIS — D72829 Elevated white blood cell count, unspecified: Secondary | ICD-10-CM | POA: Diagnosis not present

## 2017-11-02 LAB — CBC WITH DIFFERENTIAL (CANCER CENTER ONLY)
Abs Immature Granulocytes: 0.04 10*3/uL (ref 0.00–0.07)
Basophils Absolute: 0 10*3/uL (ref 0.0–0.1)
Basophils Relative: 0 %
Eosinophils Absolute: 0.2 10*3/uL (ref 0.0–0.5)
Eosinophils Relative: 1 %
HCT: 34.1 % — ABNORMAL LOW (ref 39.0–52.0)
Hemoglobin: 10.8 g/dL — ABNORMAL LOW (ref 13.0–17.0)
Immature Granulocytes: 0 %
Lymphocytes Relative: 51 %
Lymphs Abs: 10.6 10*3/uL — ABNORMAL HIGH (ref 0.7–4.0)
MCH: 26.4 pg (ref 26.0–34.0)
MCHC: 31.7 g/dL (ref 30.0–36.0)
MCV: 83.4 fL (ref 80.0–100.0)
Monocytes Absolute: 4 10*3/uL — ABNORMAL HIGH (ref 0.1–1.0)
Monocytes Relative: 19 %
Neutro Abs: 6.2 10*3/uL (ref 1.7–7.7)
Neutrophils Relative %: 29 %
Platelet Count: 140 10*3/uL — ABNORMAL LOW (ref 150–400)
RBC: 4.09 MIL/uL — ABNORMAL LOW (ref 4.22–5.81)
RDW: 16.4 % — ABNORMAL HIGH (ref 11.5–15.5)
WBC Count: 21 10*3/uL — ABNORMAL HIGH (ref 4.0–10.5)
nRBC: 0 % (ref 0.0–0.2)

## 2017-11-02 LAB — COMPREHENSIVE METABOLIC PANEL
ALT: 14 U/L (ref 0–44)
AST: 20 U/L (ref 15–41)
Albumin: 3.5 g/dL (ref 3.5–5.0)
Alkaline Phosphatase: 74 U/L (ref 38–126)
Anion gap: 12 (ref 5–15)
BUN: 58 mg/dL — ABNORMAL HIGH (ref 8–23)
CO2: 22 mmol/L (ref 22–32)
Calcium: 8.7 mg/dL — ABNORMAL LOW (ref 8.9–10.3)
Chloride: 108 mmol/L (ref 98–111)
Creatinine, Ser: 3.98 mg/dL (ref 0.61–1.24)
GFR calc Af Amer: 16 mL/min — ABNORMAL LOW (ref >60)
GFR calc non Af Amer: 13 mL/min — ABNORMAL LOW (ref >60)
Glucose, Bld: 115 mg/dL — ABNORMAL HIGH (ref 70–99)
Potassium: 4.5 mmol/L (ref 3.5–5.1)
Sodium: 142 mmol/L (ref 135–145)
Total Bilirubin: 0.6 mg/dL (ref 0.3–1.2)
Total Protein: 6.7 g/dL (ref 6.5–8.1)

## 2017-11-02 LAB — URIC ACID: Uric Acid, Serum: 5.8 mg/dL (ref 3.7–8.6)

## 2017-11-02 NOTE — Patient Instructions (Signed)
We discussed in detail the results of your laboratory studies from 2 weeks ago.  Those results suggest that the white blood cell count is elevated with a predominance of lymphocytes.  In addition, the red blood cell count is low.  In order to determine whether or not these lymphocytes are of an abnormal clone, flow cytometry (fingerprinting) of these lymphocytes will be done to determine whether or not they are normal or abnormal.    Other important findings are a worsening of your overall kidney function.  That test will be repeated today.  You will be called with the results.  In addition, it appears that your vitamin B12 level is low.  The metabolite of I94 and folic acid is a more sensitive test which will be obtained today.  This will determine whether or not you need vitamin replacement.  At the present time we do not know  There was no evidence of iron deficiency, premature destruction of cells, hepatitis C, or an abnormal protein in the blood responsible for your elevated white blood cell count.  Barring any unforeseen complications, your neck scheduled doctor visit is on November 4.  Please do not hesitate to call should any questions arise in the interim.  Thank you! Ladona Ridgel, MD

## 2017-11-02 NOTE — Progress Notes (Signed)
Dr. Audelia Hives spoke with Dr. Lawson Radar regarding patient's creatinine level of 3.98.  Dr. Lawson Radar instructed for patient to discontinue Lisinopril and await further instructions from provider.  Dr. Audelia Hives relayed information to patient.  Patient voiced understanding and agreement.  Will await follow up instructions from Dr. Carolin Sicks.

## 2017-11-02 NOTE — Telephone Encounter (Signed)
Appts scheduled avs/calendar printed per 10/23 los °

## 2017-11-03 LAB — HOMOCYSTEINE: Homocysteine: 31.9 umol/L — ABNORMAL HIGH (ref 0.0–15.0)

## 2017-11-04 LAB — METHYLMALONIC ACID, SERUM: Methylmalonic Acid, Quantitative: 560 nmol/L — ABNORMAL HIGH (ref 0–378)

## 2017-11-07 ENCOUNTER — Telehealth: Payer: Self-pay

## 2017-11-07 DIAGNOSIS — R0602 Shortness of breath: Secondary | ICD-10-CM | POA: Insufficient documentation

## 2017-11-07 NOTE — Telephone Encounter (Signed)
Returned pt call regarding SOB that started last Thursday. SOB associated with activity. Resolves after sitting for a few minutes. If pt walks slowly, SOB does not occur. Dr. Audelia Hives reviewed pt chart. Recommendation for pt to f/u with PCP. Called pt back with this assessment, pt plan to call PCP to f/u.

## 2017-11-13 ENCOUNTER — Other Ambulatory Visit: Payer: Self-pay | Admitting: Hematology and Oncology

## 2017-11-13 DIAGNOSIS — C9110 Chronic lymphocytic leukemia of B-cell type not having achieved remission: Secondary | ICD-10-CM

## 2017-11-14 ENCOUNTER — Inpatient Hospital Stay: Payer: Medicare Other | Admitting: Hematology and Oncology

## 2017-11-15 ENCOUNTER — Inpatient Hospital Stay: Payer: Medicare Other | Attending: Hematology and Oncology | Admitting: Hematology and Oncology

## 2017-11-15 ENCOUNTER — Ambulatory Visit
Admission: RE | Admit: 2017-11-15 | Discharge: 2017-11-15 | Disposition: A | Discharge status: Home / Self Care | Payer: Medicare Other | Source: Ambulatory Visit | Attending: Urology | Admitting: Urology

## 2017-11-15 ENCOUNTER — Other Ambulatory Visit: Payer: Self-pay | Admitting: Hematology and Oncology

## 2017-11-15 ENCOUNTER — Encounter: Payer: Self-pay | Admitting: Hematology and Oncology

## 2017-11-15 VITALS — BP 159/56 | HR 63 | Temp 98.4°F | Resp 18 | Ht 78.0 in | Wt 259.4 lb

## 2017-11-15 DIAGNOSIS — Z7982 Long term (current) use of aspirin: Secondary | ICD-10-CM | POA: Diagnosis not present

## 2017-11-15 DIAGNOSIS — C9110 Chronic lymphocytic leukemia of B-cell type not having achieved remission: Secondary | ICD-10-CM

## 2017-11-15 DIAGNOSIS — E538 Deficiency of other specified B group vitamins: Secondary | ICD-10-CM

## 2017-11-15 DIAGNOSIS — E119 Type 2 diabetes mellitus without complications: Secondary | ICD-10-CM | POA: Diagnosis not present

## 2017-11-15 DIAGNOSIS — D696 Thrombocytopenia, unspecified: Secondary | ICD-10-CM

## 2017-11-15 DIAGNOSIS — C61 Malignant neoplasm of prostate: Secondary | ICD-10-CM

## 2017-11-15 DIAGNOSIS — Z79899 Other long term (current) drug therapy: Secondary | ICD-10-CM | POA: Diagnosis not present

## 2017-11-15 DIAGNOSIS — I10 Essential (primary) hypertension: Secondary | ICD-10-CM | POA: Diagnosis not present

## 2017-11-15 DIAGNOSIS — D649 Anemia, unspecified: Secondary | ICD-10-CM

## 2017-11-15 DIAGNOSIS — Z794 Long term (current) use of insulin: Secondary | ICD-10-CM

## 2017-11-15 MED ORDER — FOLIC ACID 1 MG PO TABS: 1.0000 mg | ORAL_TABLET | Freq: Every day | ORAL | 3 refills | Status: AC

## 2017-11-15 MED ORDER — CYANOCOBALAMIN 1000 MCG/ML IJ SOLN: INTRAMUSCULAR | 1 refills | Status: DC

## 2017-11-15 NOTE — Patient Instructions (Signed)
We discussed in detail the results of your laboratory studies which reveal vitamin A55 and folic acid deficiency.  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  Literature was given regarding the natural history and behavior of chronic lymphocytic leukemia.  A prescription for vitamin B12: 1000 mcg SQ once daily for 7 days; followed by once weekly for 4 weeks; followed by once monthly thereafter was written.  A prescription for folic acid: 1 mg once daily with food was sent to your pharmacy directly.  Barring any unforeseen complications, your next scheduled doctor visit with laboratory studies is on November 19.  If not already obtained, stay up-to-date with all of your vaccinations including seasonal influenza, and pneumonia vaccines (2).  Please do not hesitate to call should any new or untoward problems arise.

## 2017-11-15 NOTE — Progress Notes (Signed)
Hematology/Oncology Outpatient Progress Note  Patient Name:  Thomas Mcguire  DOB: 12/01/1940   Date of Service: November 15, 2017  Referring Provider: Leanna Battles, MD  [address] [place], [place] [address]   Consulting Physician: Thomas Leber, MD Hematology/Oncology  Reason for Visit: In the setting of leukocytosis/lymphocytosis and anemia over an indefinite period of time, he presents now to discuss the results of of his preliminary laboratory studies and recommendations.  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 His primary care physician is Dr. Leanna Mcguire. He is alone at this visit.  He denies any recent infection, surgery, intense physical or emotional stress, new medication, or family history of any blood disorder. On September 13, 2017 a complete blood count showed hemoglobin 10.1 MCV 85.7 MCH 25.6 WBC 19.23; platelets 122,000. On October 11, 2017 a complete blood count showed hemoglobin 11.1 hematocrit 35.1 MCV 83 MCH 26.3 WBC 20.4 with 25% neutrophils 52% lymphocytes 22% monocytes 1% eosinophil; platelets 142,000. Serum iron 52 TIBC 263 iron saturation 20% serum ferritin 78 intact PTH 273.  He was seen and evaluated previously by Dr. Duncan Mcguire, for stage IV chronic kidney injury. His most recent colonoscopy was 3 years earlier reportedly normal. He has chronic swelling of his legs and wears support stockings to reduce the swelling. At the time of his initial visit on October 4, we discussed in detail the  results of his most recent laboratory studies from October which revealedleukocytosis/lymphocytosis, anemia, and a transiently low platelet count.  We discussed previously in the results of his laboratory studies from October 4.   The results of his complete blood count show a persistently elevated white blood cell count with lymphocytic shift.  He is mildly anemic, 11.8 g/dL. His platelet count is normal 147,000. There was a faint band in the gamma region of  serum protein electrophoresis.  His immunofixation pattern however was normal.  His quantitative immunoglobulins were normal.  The kappa/lambda light chain analysis showed a combined elevation of both kappa and lambda free light chains. The kappa/lambda light chain ratio was elevated additionally.  vitamin B12 was low 162 (180-914).  The folic acid levels was normal.  A hemoglobin electrophoresis revealed a normal adult hemoglobin pattern. There was no evidence of hemolysis. His serum creatinine was 3.77.   His baseline creatinine is 4.0.  He has stage IV renal disease.  His liver function and electrolytes were normal.  A serum erythropoietin level was 9.1.  His iron studies were somewhat difficult to interpret since ferritin is an acute phase reactant.  Ferritin was 59.  Iron 48 TIBC 305 iron saturation 16%. They are more consistent with poor iron utilization as in chronic disease as opposed to iron deficiency.  On October 23 a repeat complete blood count was performed.  The results of that study revealed hemoglobin 10.8 hematocrit 34.1 MCV 83.4 MCH 26.4 RDW 16.4 WBC 21.0 with 29% neutrophils 51% lymphocytes 19% monocytes 1% eosinophil; platelets 140,000.  Both Homocysteine and methylmalonic acid were markedly elevated consistent with vitamin Z66 and folic acid deficiency.  BCR-ABL balanced gene translocation by FISH was not identified.  On peripheral blood, immunophenotypic flow cytometry revealed  CD5 positive monoclonal B-cell population with kappa  restricted B cells that expressed CD19, CD20, CD5, and CD23 which comprise 82% of all lymphocytes.  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  FISH studies for molecular profiling and t(11;14) translocation was requested to exclude an atypical mantle cell lymphoma. It is with this background he presents now for further discussion in the setting of a mild multifactorial anemia, associated with mild thrombocytopenia and leukocytosis/lymphocytosis with evidence of B-cell chronic lymphocytic leukemia, stage IV chronic kidney injury, vitamin N39 and folic acid deficiency as outlined above.  Interval History: In the interim since he denies any appetite or weight deficit. He has no rash or itching. He has no unusual headache, dizziness, lightheadedness, syncope, or near syncopal episodes. He denies newvisual changes or hearing deficit. He reports no rash or itching.  He reports no unusual cough, sore throat, or orthopnea. He denies dyspnea either at rest or on minimal exertion. There is no pain or difficulty in swallowing. No fever, shaking chills, sweats, or flulike symptoms are reported. He has no heartburn or indigestion. He has no chest or abdominal pain. There is no nausea, vomiting, diarrhea, or constipation. He moves his bowels once daily. He denies melena or bright red blood per rectum. No urinary frequency, urgency, hematuria, or dysuria are evident. There are no new focal areas of bone, joint, muscle pain. He has paresthesias involving the lower extremities consistent with diabetic neuropathy/polyneuropathy.   Past Medical History:  Diagnosis Date  . Arthritis   . Chronic renal insufficiency    CHECKED Q3MOS PER PT. NO TREATMENT  . Diabetes mellitus   . Gout    Has not had recently- 08/24/11  . Hyperlipidemia   . Hypertension   . Neuromuscular disorder ([place])    Neuropathy   Past Medical History Reviewed         Family History Reviewed       Social History [place]  Allergies:  He has no known medical allergies He has no food or seasonal allergies  Current Outpatient Medications on File Prior to Visit  Medication Sig  . allopurinol (ZYLOPRIM) 300 MG tablet Take 300 mg by mouth daily.    Marland Kitchen aspirin 325 MG tablet Take 325 mg by mouth daily.    . carvedilol (COREG) 25 MG tablet Take 25 mg by mouth 2 (two) times daily with a meal.  . COD LIVER OIL PO Take 1 capsule by mouth daily.   Marland Kitchen doxazosin (CARDURA) 2 MG tablet Take 2 mg by mouth at bedtime.  . furosemide (LASIX) 40 MG tablet Take 80 mg by mouth daily.   Marland Kitchen guanFACINE (TENEX) 2 MG tablet Take 2 mg by mouth every morning.   Marland Kitchen HYDROcodone-acetaminophen (VICODIN) 5-500 MG per tablet Take 1-2 tablets by mouth daily as needed. For pain  . insulin NPH (HUMULIN N,NOVOLIN N) 100 UNIT/ML injection Inject 50 Units into the skin 2 (two) times daily. Inject 50 units in am and 50 units in pm  . insulin regular (HUMULIN R,NOVOLIN R) 100 units/mL injection Inject 22 Units into the skin 3 (three) times daily before meals.   . pravastatin (PRAVACHOL) 40 MG tablet Take 40 mg by mouth daily.     No current facility-administered medications on file prior to visit.     Review of Systems: Constitutional: No fever, sweats, or shaking chills. No appetite or weight deficit. Skin: No rash, scaling, sores, lumps, or jaundice. HEENT: No visual changes or hearing  deficit.No allergic rhinitis or sinusitis. Pulmonary: No unusual cough, sore throat, or orthopnea.No DOE or COPD. Cardiovascular: No coronary artery disease, angina, or myocardial infarction. No cardiac dysrhythmia, essential hypertension, or dyslipidemia. Gastrointestinal: No indigestion, dysphagia, abdominal pain, diarrhea, or constipation.No change in bowel habits.No nausea or vomiting. No melena or bright red blood per rectum. Genitourinary: Nourinaryfrequency, urgency, hematuria, or dysuria;early  stage prostate cancer on active surveillance. Musculoskeletal: No arthralgias or myalgias; no joint swelling, pain, or instability. Hematologic: No bleeding tendency or easy bruisability. Endocrine: No intolerance to heat or cold; no thyroid disease;insulin requiring diabetes mellitus. Vascular: No peripheral arterial or venous thromboembolic disease. Psychological: No anxiety, depression, or mood changes; no mental health illnesses. Neurological: No dizziness, lightheadedness, syncope, or near syncopal episodes;paresthesia and tense edema involving the lower extremities..  Physical Examination: Vital Signs: Body surface area is 2.55 meters squared.  Vitals:   11/15/17 1615  BP: (!) 159/56  Pulse: 63  Resp: 18  Temp: 98.4 F (36.9 C)  SpO2: 99%    Filed Weights   11/15/17 1615  Weight: 259 lb 6.4 oz (117.7 kg)  Body mass index is 29.98 kg/m.  ECOG PERFORMANCE STATUS:1 Constitutional:[name] afully nourished and developedAfrican-American. He looks age appropriate. Heis friendly and cooperative without respiratory compromise at rest. Skin: No rashes, scaling, dryness, jaundice, or itching. HEENT: Head is normocephalic and atraumatic. Pupils are equal round and reactive to light and accommodation. Sclerae are anicteric. Conjunctivae are pale.Bilateral arcus senilis.No sinus tenderness nor oropharyngeal lesions. Lips without cracking or peeling; tongue without mass, inflammation, or nodularity. Mucous membranes are moist. He has dentures. Neck: Supple and symmetric. No jugular venous distention or thyromegaly. Trachea is midline. Lymphatics: No cervical or supraclavicular lymphadenopathy. No epitrochlear, axillary, or inguinal lymphadenopathy is appreciated. Respiratory/chest: Thorax is symmetrical. Breath sounds are clear to auscultation and percussion. Normal excursion and respiratory effort. Back: Symmetric without deformity or  tenderness. Cardiovascular: Heart rate and rhythm are regular without murmurs, gallops, or rubs. Gastrointestinal: Abdomen is soft, nontender; no organomegaly. Bowel sounds are normoactive. No masses are appreciated. Extremities: In the lower extremities, there is no asymmetricswelling,erythema, tenderness, or cord formation.He has bilateral tense pitting edema (wearing support stockings). No clubbingorcyanosis, Hematologic: No petechiae, hematomas, or ecchymoses. Psychological: He is oriented to person, place, and time; normalaffect. Neurological:There are no gross neurologic deficits.  Laboratory Results: November 02, 2017  Ref Range & Units 13d ago 11mo ago 61yr ago 95yr ago  WBC Count 4.0 - 10.5 K/uL 21.0High   19.7High  R 11.0High   10.7High  R  RBC 4.22 - 5.81 MIL/uL 4.09Low   4.50 R 4.49  4.37 R  Hemoglobin 13.0 - 17.0 g/dL 10.8Low   11.8Low  R 12.2Low   12.0Low  R  HCT 39.0 - 52.0 % 34.1Low   37.4Low  R 37.6Low   37.0Low  R  MCV 80.0 - 100.0 fL 83.4  83.1 R 83.7 R 84.6 R  MCH 26.0 - 34.0 pg 26.4  26.2Low  R 27.2    MCHC 30.0 - 36.0 g/dL 31.7  31.5Low  R 32.4  32.6 R  RDW 11.5 - 15.5 % 16.4High   17.8High  R 16.1High   15.6High  R  Platelet Count 150 - 400 K/uL 140Low   147 R 134Low   188 R  nRBC 0.0 - 0.2 % 0.0      Neutrophils Relative % % 29  26     Neutro Abs 1.7 - 7.7 K/uL 6.2  5.1 R    Lymphocytes  Relative % 51  69     Comment: ATYP SEEN  Lymphs Abs 0.7 - 4.0 K/uL 10.6High   13.7High  R    Monocytes Relative % 19  4     Monocytes Absolute 0.1 - 1.0 K/uL 4.0High   0.7 R    Eosinophils Relative % 1  1     Eosinophils Absolute 0.0 - 0.5 K/uL 0.2  0.2     Basophils Relative % 0  0     Basophils Absolute 0.0 - 0.1 K/uL 0.0  0.1 CM    Immature Granulocytes % 0      Abs Immature Granulocytes 0.00 - 0.07 K/uL 0.04      Smudge Cells  PRESENT        Ref Range & Units 13d ago 34mo ago 51yr ago 26yr ago  Sodium 135 - 145 mmol/L 142  142  142 R 136 R  Potassium 3.5 - 5.1  mmol/L 4.5  4.5  4.6 R 3.9 R  Chloride 98 - 111 mmol/L 108  107  105 R 101 R  CO2 22 - 32 mmol/L 22  24  29  R 28 R  Glucose, Bld 70 - 99 mg/dL 115High   108High   143High   92 R  BUN 8 - 23 mg/dL 58High   59High   37High  R 31High  R  Creatinine, Ser 0.61 - 1.24 mg/dL 3.98High Panic   3.77High Panic  CM 1.66High  R 1.41 R  Comment: REPEATED TO VERIFY  CRITICAL RESULT CALLED TO, READ BACK BY AND VERIFIED WITH: DR. Shenica Holzheimer@[email or web address]  L [place]   Calcium 8.9 - 10.3 mg/dL 8.7Low   9.1  9.2 R 8.7 R  Total Protein 6.5 - 8.1 g/dL 6.7  7.6  6.7 R   Albumin 3.5 - 5.0 g/dL 3.5  4.0  3.5 R   AST 15 - 41 U/L 20  19  18  R   ALT 0 - 44 U/L 14  19  16  R   Alkaline Phosphatase 38 - 126 U/L 74  82  59 R   Total Bilirubin 0.3 - 1.2 mg/dL 0.6  0.5  0.4    GFR calc non Af Amer >60 mL/min 13Low   14Low   40Low  R 50Low  R  GFR calc Af Amer >60 mL/min 16Low   17Low  CM 47Low  R, CM >60     Uric acid 5.8 Homocysteine 31.9 (0-15.0) Methylmalonic acid 560 (0-378)  October 14, 2017  Ref Range & Units 2wk ago 79yr ago 78yr ago  WBC Count 4.0 - 10.3 K/uL 19.7High   11.0High  R 10.7High  R  RBC 4.20 - 5.82 MIL/uL 4.50  4.49 R 4.37 R  Hemoglobin 13.0 - 17.1 g/dL 11.8Low   12.2Low  R 12.0Low  R  HCT 38.4 - 49.9 % 37.4Low   37.6Low  R 37.0Low  R  MCV 79.3 - 98.0 fL 83.1  83.7 R 84.6 R  MCH 27.2 - 33.4 pg 26.2Low   27.2 R   MCHC 32.0 - 36.0 g/dL 31.5Low   32.4 R 32.6 R  RDW 11.0 - 14.6 % 17.8High   16.1High  R 15.6High  R  Platelet Count 140 - 400 K/uL 147  134Low  R 188 R  Smear Review  Few shistocytes     Neutrophils Relative % % 26     Neutro Abs 1.5 - 6.5 K/uL 5.1     Lymphocytes Relative % 69  Lymphs Abs 0.9 - 3.3 K/uL 13.7High      Monocytes Relative % 4     Monocytes Absolute 0.1 - 0.9 K/uL 0.7     Eosinophils Relative % 1     Eosinophils Absolute 0.0 - 0.5 K/uL 0.2     Basophils Relative % 0     Basophils Absolute 0.0 - 0.1 K/uL 0.1       Ref Range & Units 2wk ago 81yr ago  66yr ago  Sodium 135 - 145 mmol/L 142  142 R 136 R  Potassium 3.5 - 5.1 mmol/L 4.5  4.6 R 3.9 R  Chloride 98 - 111 mmol/L 107  105 R 101 R  CO2 22 - 32 mmol/L 24  29 R 28 R  Glucose, Bld 70 - 99 mg/dL 108High   143High   92 R  BUN 8 - 23 mg/dL 59High   37High  R 31High  R  Creatinine, Ser 0.61 - 1.24 mg/dL 3.77High Panic   1.66High  R 1.41 R  Comment: CRITICAL RESULT CALLED TO, READ BACK BY AND VERIFIED WITH: Dr. Ladona Ridgel at 1146. RQ   Calcium 8.9 - 10.3 mg/dL 9.1  9.2 R 8.7 R  Total Protein 6.5 - 8.1 g/dL 7.6  6.7 R   Albumin 3.5 - 5.0 g/dL 4.0  3.5 R   AST 15 - 41 U/L 19  18 R   ALT 0 - 44 U/L 19  16 R   Alkaline Phosphatase 38 - 126 U/L 82  59 R   Total Bilirubin 0.3 - 1.2 mg/dL 0.5  0.4    GFR calc non Af Amer >60 mL/min 14Low   40Low  R 50Low  R  GFR calc Af Amer >60 mL/min 17Low      Reticulocyte count 1% Erythropoietin 9.1 LDH 269 Haptoglobin 96 Hemoglobin electrophoresis: Normal adult hemoglobin Vitamin B12 162 (180-914) BCR-ABL 1 balance translocation: Not detected Serum protein electrophoresis, immunofixation, and light chain analysis: There is a faint band in the gamma region suspicious for a monoclonal immunoglobulin. Quantitative immunoglobulins:  IgG 1089 IgA 165 IgM 28 Free kappa light chain 137.1 Free lambda light chain 52.0 Free kappa/lambda light chain ratio: 2.64 (0.26-1.65). Component 2wk ago  Path Review Reviewed by Arrie Aran L. Melina Copa, M.D.   Comment: 10.4.19  ABSOLUTE LYMPHOCYTOSIS AND NORMOCYTIC ANEMIA. FLOW CYTOMETRY PENDING.  Performed at [facility], [place] [address], [place], [place] [address]   Hepatitis C antibody: <0.1: Negative.  Summary/Assessment: In the setting of multifactorial anemia and mild thrombocytopenia, with leukocytosis/lymphocytosis likely secondary to chronic kidney injury, vitamin Y07 and folic acid deficiency, and B-cell chronic lymphocytic leukemia, he presents now to discuss the results of his  recent laboratory studies and recommendations.    He has no recent infection, surgery, intense physical or emotional stress, new medication, or family history of any blood disorder. On September 13, 2017 a complete blood count showed hemoglobin 10.1 MCV 85.7 MCH 25.6 WBC 19.23; platelets 122,000. On October 11, 2017 a complete blood count showed hemoglobin 11.1 hematocrit 35.1 MCV 83 MCH 26.3 WBC 20.4 with 25% neutrophils 52% lymphocytes 22% monocytes 1% eosinophil; platelets 142,000. Serum iron 52 TIBC 263 iron saturation 20% serum ferritin 78 intact PTH 273.  He was seen and evaluated previously by Dr. Duncan Mcguire, for stage IV chronic kidney injury. His most recent colonoscopy was 3 years earlier reportedly normal. He has chronic swelling of his legs and wears support stockings to reduce the swelling. At the time  of his initial visit on October 4, we discussed in detail the results of his most recent laboratory studies from October which revealedleukocytosis/lymphocytosis, anemia, and a transiently low platelet count. The results of his complete blood count show a persistently elevated white blood cell count with lymphocytic shift.  He is mildly anemic, 11.8 g/dL. His platelet count is normal 147,000.   There was a faint band in the gamma region of  serum protein electrophoresis.  His immunofixation pattern however was normal.  His quantitative immunoglobulins were normal. The kappa/lambda light chain analysis showed a combined elevation of both kappa and lambda free light chains. The kappa/lambda light chain ratio was elevated additionally.    Vitamin B12 was low 162 (180-914).  The folic acid levels was normal.  A hemoglobin electrophoresis revealed a normal adult hemoglobin pattern. There was no evidence of hemolysis. His serum creatinine was 3.77.   His baseline creatinine is 4.0.  He has stage IV renal disease.  His liver function and electrolytes were normal.  A serum  erythropoietin level was 9.1.  His iron studies were somewhat difficult to interpret since ferritin is an acute phase reactant. Ferritin was 59.  Iron 48 TIBC 305 iron saturation 16%. They are more consistent with poor iron utilization as in chronic disease as opposed to iron deficiency.    On October 23 a repeat complete blood count was performed.  The results of that study revealed hemoglobin 10.8 hematocrit 34.1 MCV 83.4 MCH 26.4 RDW 16.4 WBC 21.0 with 29% neutrophils 51% lymphocytes 19% monocytes 1% eosinophil; platelets 140,000.  Both Homocysteine and methylmalonic acid were markedly elevated consistent with vitamin H37 and folic acid deficiency.  BCR-ABL balanced gene translocation by FISH was not identified.  On peripheral blood, immunophenotypic flow cytometry revealed CD5 positive monoclonal B-cell population with kappa restricted B cells that expressed CD19, CD20, CD5, and CD23 which comprise 82% of all lymphocytes. 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  FISH studies for molecular profiling and t(11;14) translocation was requested to exclude an atypical mantle cell lymphoma.   In the interim since he denies any appetite or weight deficit. He has no rash or itching. He has no unusual headache, dizziness, lightheadedness, syncope, or near syncopal episodes. He denies newvisual changes or hearing deficit. He reports no rash or itching. He reports no unusual cough, sore throat, or orthopnea.He denies dyspnea either at rest or on minimal exertion. There is no pain or difficulty in swallowing. No fever, shaking chills, sweats, or flulike symptoms are reported. He has no heartburn or indigestion. He has no chest or abdominal pain. There is no nausea, vomiting, diarrhea, or constipation. He moves his bowels once daily. He denies melena or bright red blood per rectum. No urinary frequency, urgency, hematuria, or dysuria are evident.  There are no new focal areas of bone, joint, muscle pain. He has paresthesias involving the lower extremities consistent with diabetic neuropathy/polyneuropathy.   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   Recommendation/Plan: I discussed in lengthy detail the results of his immunophenotypic flow cytometry on peripheral blood, methylmalonic acid, and serum homocysteine.  As discussed above, the immunophenotypic profile on peripheral blood suggested a  CD5 positive B-cell chronic lymphocytic leukemia.  Previously his vitamin B12 level was low.  Both methylmalonic acid and serum homocysteine were significantly elevated consistent with vitamin P49 and folic acid deficiency.  Because of his worsening anemia, and chronic stable thrombocytopenia, in the setting of chronic kidney injury (stage IV), it was felt that aggressive vitamin I26 and folic acid replacement be initiated.  Literature was given regarding the natural history and behavior of B-cell chronic lymphocytic leukemia.  He was given a prescription for vitamin B12: 1000 mcg subcu once daily for 7 days; followed by vitamin B12: 1000 mcg subcu once weekly for 4 weeks; followed by once monthly thereafter.  He was given also a calendar to facilitate vitamin B12 administration.  A prescription for folic acid: 1 mg once daily with food was written.  He was advised to start immediately.  Although he has received the seasonal influenza vaccine, he was reminded to stay current with both pneumococcal vaccines (2) if not already administered through his primary care office.  Barring any  unforeseen complications, his next scheduled doctor visit with laboratory studies is on November 29, 2017.  He was advised to call us in the interim should any new or untoward problems arise.  The total time spent discussing the results of his most recent laboratory evaluation including flow cytometry, vitamin E15 and folic acid levels, methylmalonic acid and homocysteine levels, role and rationale for both vitamin A30 and folic acid replacement, and natural history and behavior of B-cell chronic lymphocytic leukemia was 45 minutes. At least 50% of that time was spent in face-to-face discussion, reviewing laboratory studies, counseling, and answering questions. There was ample time was allotted to answer all questions.  This note was dictated using voice activated technology/software.  Unfortunately, typographical errors are not uncommon, and transcription is subject to mistakes and regrettably misinterpretation.  If necessary, clarification of the above information can be discussed with me at any time.  FOLLOW UP: AS DIRECTED   NM:[id number] Philip Aspen, MD Dron Bhandari,MD   Thomas Leber, MD  Hematology/Oncology [facility] [address] [place],  [address] Office: [phone] WKMQ: [phone]

## 2017-11-16 ENCOUNTER — Telehealth: Payer: Self-pay | Admitting: Hematology and Oncology

## 2017-11-16 NOTE — Telephone Encounter (Signed)
Appts scheduled and I left message on VM for patient with date/time per 11/5 los

## 2017-11-30 ENCOUNTER — Encounter: Payer: Self-pay | Admitting: Hematology and Oncology

## 2017-11-30 ENCOUNTER — Inpatient Hospital Stay: Payer: Medicare Other

## 2017-11-30 ENCOUNTER — Inpatient Hospital Stay (HOSPITAL_BASED_OUTPATIENT_CLINIC_OR_DEPARTMENT_OTHER): Payer: Medicare Other | Admitting: Hematology and Oncology

## 2017-11-30 ENCOUNTER — Telehealth: Payer: Self-pay | Admitting: Hematology and Oncology

## 2017-11-30 VITALS — BP 166/57 | HR 64 | Temp 97.8°F | Resp 18 | Ht 78.0 in | Wt 241.0 lb

## 2017-11-30 DIAGNOSIS — E538 Deficiency of other specified B group vitamins: Secondary | ICD-10-CM | POA: Diagnosis not present

## 2017-11-30 DIAGNOSIS — D649 Anemia, unspecified: Secondary | ICD-10-CM | POA: Diagnosis not present

## 2017-11-30 DIAGNOSIS — D696 Thrombocytopenia, unspecified: Secondary | ICD-10-CM

## 2017-11-30 DIAGNOSIS — C9110 Chronic lymphocytic leukemia of B-cell type not having achieved remission: Secondary | ICD-10-CM | POA: Diagnosis not present

## 2017-11-30 DIAGNOSIS — N184 Chronic kidney disease, stage 4 (severe): Secondary | ICD-10-CM | POA: Diagnosis not present

## 2017-11-30 LAB — BASIC METABOLIC PANEL - CANCER CENTER ONLY
Anion gap: 15 (ref 5–15)
BUN: 83 mg/dL — ABNORMAL HIGH (ref 8–23)
CO2: 28 mmol/L (ref 22–32)
Calcium: 9.1 mg/dL (ref 8.9–10.3)
Chloride: 96 mmol/L — ABNORMAL LOW (ref 98–111)
Creatinine: 5.38 mg/dL (ref 0.61–1.24)
GFR, Est AFR Am: 11 mL/min — ABNORMAL LOW (ref >60)
GFR, Estimated: 9 mL/min — ABNORMAL LOW (ref >60)
Glucose, Bld: 109 mg/dL — ABNORMAL HIGH (ref 70–99)
Potassium: 3.8 mmol/L (ref 3.5–5.1)
Sodium: 139 mmol/L (ref 135–145)

## 2017-11-30 LAB — CBC WITH DIFFERENTIAL (CANCER CENTER ONLY)
Abs Immature Granulocytes: 0.05 10*3/uL (ref 0.00–0.07)
Basophils Absolute: 0 10*3/uL (ref 0.0–0.1)
Basophils Relative: 0 %
Eosinophils Absolute: 0.1 10*3/uL (ref 0.0–0.5)
Eosinophils Relative: 1 %
HCT: 35.3 % — ABNORMAL LOW (ref 39.0–52.0)
Hemoglobin: 11.1 g/dL — ABNORMAL LOW (ref 13.0–17.0)
Immature Granulocytes: 0 %
Lymphocytes Relative: 57 %
Lymphs Abs: 13.1 10*3/uL — ABNORMAL HIGH (ref 0.7–4.0)
MCH: 26.3 pg (ref 26.0–34.0)
MCHC: 31.4 g/dL (ref 30.0–36.0)
MCV: 83.6 fL (ref 80.0–100.0)
Monocytes Absolute: 3.8 10*3/uL — ABNORMAL HIGH (ref 0.1–1.0)
Monocytes Relative: 17 %
Neutro Abs: 5.7 10*3/uL (ref 1.7–7.7)
Neutrophils Relative %: 25 %
Platelet Count: 164 10*3/uL (ref 150–400)
RBC: 4.22 MIL/uL (ref 4.22–5.81)
RDW: 16 % — ABNORMAL HIGH (ref 11.5–15.5)
WBC Count: 22.9 10*3/uL — ABNORMAL HIGH (ref 4.0–10.5)
nRBC: 0 % (ref 0.0–0.2)

## 2017-11-30 NOTE — Telephone Encounter (Signed)
Gave pt avs and calendar  °

## 2017-11-30 NOTE — Patient Instructions (Addendum)
Continue vitamin Q65 and folic acid as previously recommended.  Lab work and office visit with Dr. Johnanna Schneiders on December 18.  Please do not hesitate to call in the interim should any new or untoward problems arise.  Thank you! God bless you! Happy happy Thanksgiving!! Chronic Lymphocytic Leukemia Chronic lymphocytic leukemia (CLL) is a type of cancer of the blood cells and soft tissue inside bones (bone marrow). CLL happens when your bone marrow makes too many abnormal white blood cells. The cells, called leukemia cells, do not function normally and accumulate in the blood. Eventually they crowd out other healthy blood cells. CLL usually gets worse slowly. It can cause complications in your organs, such as in your spleen. It can also weaken your immune system and lead to conditions in which your immune system attacks your body (autoimmune conditions). What are the causes? The cause of this condition is not known. What increases the risk? You are more likely to develop this condition if:  You are older than 50 years.  You are white.  You are male.  You have a family history of CLL or other cancers of the lymph system.  You are of [place] or [place] descent.  You have been exposed to certain chemicals, such as: ? Insecticides. ? Herbicides. These include Agent Orange, a herbicide used in the [place] war.  What are the signs or symptoms? At first, there may be no symptoms. After a while, symptoms may include:  Feeling more tired than usual, even after rest.  Unplanned weight loss.  Heavy sweating at night.  Fever.  Shortness of breath.  Paleness.  Painless, swollen lymph nodes.  A feeling of fullness in the upper left part of the abdomen.  Easy bruising or bleeding.  Frequent infections.  How is this diagnosed? This condition is diagnosed based on:  A physical exam to check for an enlarged spleen, liver, or lymph nodes.  Blood and bone  marrow tests to check for leukemia cells. Tests may include: ? A complete blood count. ? Flow cytometry. This method uses light sensors and dyes to figure out the number of cells as well as their size, structure, and general health. ? Immunophenotyping. This method is used to diagnose leukemia by identifying specific antibodies found in white blood cells. The test is used when a complete blood count shows the presence of immature cells or a high number of white blood cells. ? Fluorescence in situ hybridization (FISH). This test is used to examine defects in chromosomes and how those defects affect the functioning of the cell. Results from a [facility] test will be used to determine treatment and assess the outcome of that treatment.  A CT scan to check for swelling or anything abnormal in your spleen, liver, and lymph nodes.  How is this treated? Treatment for this condition depends on the stage of the leukemia and whether you have symptoms. Treatment may include:  Observation.  Targeted drugs. These are medicines that interfere with the way leukemia cells grow and multiply. They identify and attack specific leukemia cells without harming normal cells.  Chemotherapy drugs. These are medicines that kill leukemia cells that are multiplying quickly.  Radiation.  Surgery to remove the spleen.  Biological therapy (immunotherapy). This treatment boosts the ability of your immune system to fight the leukemia cells.  Bone marrow or peripheral blood stem cell transplant. This treatment replaces your own bone marrow or stem cells with bone marrow or stem cells from a donor.  This treatment may be done after you receive very high doses of chemotherapy or radiation that kill your stem cells and bone marrow.  New treatments through clinical trials.  Additional medicines may be needed to help manage symptoms. Follow these instructions at home: Medicines  Take over-the-counter and prescription medicines  only as told by your health care provider.  If you were prescribed an antibiotic medicine, take it as told by your health care provider. Do not stop taking the antibiotic even if you start to feel better. If you are on chemotherapy:  Wash your hands often, especially before meals, after being outside, and after using the toilet. Have visitors do the same.  Keep your teeth and gums clean and well cared for. Use soft toothbrushes.  Protect your skin from the sun by using sunscreen and wearing protective clothing. General instructions  Avoid contact sports or other rough activities. Ask your health care provider what activities are safe for you.  Avoid crowded places and people who are sick.  Tell your cancer care team if you develop side effects. They may be able to recommend ways to relieve them.  Try to eat regular, healthy meals. Some of your treatments might affect your appetite.  Find healthy ways of coping with stress, such as by doing yoga or meditation or by joining a support group.  Keep all follow-up visits as told by your health care provider. This is important. Where to find more information:  American Cancer Society: www.cancer.org  Leukemia and Lymphoma Society: PreviewPal.pl  National Cancer Institute ([place]): www.cancer.gov Contact a health care provider if:  You have pain in your abdomen.  You develop new bruises that are getting bigger.  You have painful or more swollen lymph nodes.  You develop bleeding from your gums or nose.  You cannot eat or drink without vomiting.  You feel lightheaded. Get help right away if:  You have a fever or chills.  You develop chest pain.  You have trouble breathing or feel short of breath.  You faint.  There is blood in your urine or stool.  You have excessive bleeding.  You have any symptoms that are severe or uncontrolled. Summary  Chronic lymphocytic leukemia (CLL) is a type of cancer of the blood cells and  bone marrow.  This condition can cause an enlarged spleen, swollen lymph nodes, a weakened immune system, low red blood cell and platelets counts, and autoimmune conditions.  Treatment for this condition depends on the stage of the cancer and whether you have symptoms.  Chemotherapy, radiation, surgery to remove the spleen, and bone marrow transplant are some of the ways to treat CLL. This information is not intended to replace advice given to you by your health care provider. Make sure you discuss any questions you have with your health care provider. Document Released: 05/16/2008 Document Revised: 12/10/2015 Document Reviewed: 12/10/2015 Elsevier Interactive Patient Education  [employer].

## 2017-11-30 NOTE — Progress Notes (Signed)
Hematology/Oncology Outpatient Progress Note  Patient Name:  Thomas Mcguire  DOB: [birth year]   Date of Service: November 30, 2017  Referring Provider: Leanna Battles, Mcguire  [address] [place], [place] [address]   Consulting Physician: Thomas Mcguire Hematology/Oncology  Reason for Visit: In the setting of leukocytosis/lymphocytosis and anemia over an indefinite period of time, consistent with vitamin 75 and folic acid deficiency, now on replacement with parenteral vitamin B12/folic acid, and chronic kidney disease, he presents now to discuss the results of of his laboratory studies from today and recommendations.  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 His primary care physician is Dr. Leanna Mcguire. He is with his wife at this visit.  He denies any recent infection, surgery, intense physical or emotional stress, new medication, or family history of any blood disorder. On September 13, 2017 a complete blood count showed hemoglobin 10.1 MCV 85.7 MCH 25.6 WBC 19.23; platelets 122,000. On October 11, 2017 a complete blood count showed hemoglobin 11.1 hematocrit 35.1 MCV 83 MCH 26.3 WBC 20.4 with 25% neutrophils 52% lymphocytes 22% monocytes 1% eosinophil; platelets 142,000. Serum iron 52 TIBC 263 iron saturation 20% serum ferritin 78 intact PTH 273.  He was seen and evaluated previously by Dr. Duncan Mcguire, for stage IV chronic kidney injury. His most recent colonoscopy was 3 years earlier reportedly normal. He has  chronic swelling of his legs and wears support stockings to reduce the swelling. At the time of his initial visit on October 4, we discussed in detail the results of his most recent laboratory studies from October which revealedleukocytosis/lymphocytosis, anemia, and a transiently low platelet count.  We discussed previously in the results of his laboratory studies from October 4.   The results of his complete blood count show a persistently elevated white blood cell count with lymphocytic shift.  He is mildly anemic, 11.8 g/dL. His platelet count is normal 147,000. There was a faint band in the gamma region of  serum protein electrophoresis.  His immunofixation pattern however was normal.  His quantitative immunoglobulins were normal.  The kappa/lambda light chain analysis showed a combined elevation of both kappa and lambda free light chains. The kappa/lambda light chain ratio was elevated additionally.  vitamin B12 was low 162 (180-914).  The folic acid levels was normal.  A hemoglobin electrophoresis revealed a normal adult hemoglobin pattern. There was no evidence of hemolysis. His serum creatinine was 3.77.   His baseline creatinine is 4.0.  He has stage IV renal disease.  His liver function and electrolytes were normal.  A serum erythropoietin level was 9.1.  His iron studies were somewhat difficult to interpret since ferritin is an acute phase reactant.  Ferritin was 59.  Iron 48 TIBC 305 iron saturation 16%. They are more consistent with poor iron utilization as in chronic disease as opposed to iron deficiency.  On October 23 a repeat complete blood count was performed.  The results of that study revealed hemoglobin 10.8 hematocrit 34.1 MCV 83.4 MCH 26.4 RDW 16.4 WBC 21.0 with 29% neutrophils 51% lymphocytes 19% monocytes 1% eosinophil; platelets 140,000.  Both Homocysteine and methylmalonic acid were markedly elevated consistent with vitamin 75 and folic acid deficiency.  BCR-ABL balanced gene  translocation by FISH was not identified.  On peripheral blood, immunophenotypic flow cytometry revealed CD5 positive monoclonal B-cell population with kappa restricted B cells that expressed CD19, CD20, CD5, and CD23 which comprise 82% of all lymphocytes.  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  FISH studies for molecular profiling and t(11;14) translocation was requested to exclude an atypical mantle cell lymphoma.   Previously his vitamin B12 level was low.  Both methylmalonic acid and serum homocysteine were significantly elevated consistent with vitamin 75 and folic acid deficiency.  Because of his worsening anemia, and chronic stable thrombocytopenia, in the setting of chronic kidney injury (stage IV), it was felt that aggressive vitamin 75 and folic acid replacement be initiated. Literature was given regarding the natural history and behavior of B-cell chronic lymphocytic leukemia.  He was given a prescription for vitamin B12: 1000 mcg subcu once daily for 7 days; followed by vitamin B12: 1000 mcg subcu once weekly for 4 weeks; followed by once monthly thereafter. He was given also a calendar to facilitate vitamin B12 administration. A prescription for folic acid: 1 mg once daily with food was written. 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  Interval History: In the interim since he denies any appetite or weight deficit. He has no rash or itching. He has no unusual headache, dizziness, lightheadedness, syncope, or near syncopal episodes. He denies newvisual changes or hearing deficit.  He reports no rash or itching.  He reports no unusual cough, sore throat, or orthopnea. He denies dyspnea either at rest or on minimal exertion. There is no pain or difficulty in swallowing. No fever, shaking chills, sweats, or flulike symptoms are reported. He has no heartburn or indigestion. He has no chest or abdominal pain. There is no nausea, vomiting, diarrhea, or constipation. He moves his bowels once daily. He denies melena or bright red blood per rectum. No urinary frequency, urgency, hematuria, or dysuria are evident. There are no new focal areas of bone, joint, muscle pain. He has paresthesias involving the lower extremities consistent with diabetic neuropathy/polyneuropathy.   Past Medical History:  Diagnosis Date  . Arthritis   . Chronic renal insufficiency    CHECKED Q3MOS PER PT. NO TREATMENT  . Diabetes mellitus   . Gout    Has not had recently- 08/24/11  . Hyperlipidemia   . Hypertension   . Neuromuscular disorder ([place])    Neuropathy   Past Medical History Reviewed        Family History Reviewed       Social History Reviewe  Allergies:  He has no known medical allergies He has no food or seasonal allergies  Current Outpatient Medications on File Prior to Visit  Medication Sig  . allopurinol (ZYLOPRIM) 300 MG tablet Take 300 mg by mouth daily.    Marland Kitchen aspirin 325 MG tablet Take 325 mg by mouth daily.    . carvedilol (COREG) 25 MG tablet Take 25 mg by mouth 2 (two) times daily with a meal.  . COD LIVER OIL PO Take 1 capsule by mouth daily.   . cyanocobalamin (,VITAMIN B-12,) 1000 MCG/ML injection Vitamin B12  injection: 1000 mcg (1 mL) by subcu injection once daily for 7 days; then once weekly for 4 weeks; then once monthly thereafter.  7 07/15/2010  . doxazosin (CARDURA) 2 MG tablet Take 2 mg by mouth at bedtime.  . folic acid (FOLVITE) 1 MG tablet Take 1 tablet (1 mg total) by mouth daily.  . furosemide (LASIX) 40 MG tablet Take 80 mg by mouth daily.   Marland Kitchen  guanFACINE (TENEX) 2 MG tablet Take 2 mg by mouth every morning.   Marland Kitchen HYDROcodone-acetaminophen (VICODIN) 5-500 MG per tablet Take 1-2 tablets by mouth daily as needed. For pain  . insulin NPH (HUMULIN N,NOVOLIN N) 100 UNIT/ML injection Inject 50 Units into the skin 2 (two) times daily. Inject 50 units in am and 50 units in pm  . insulin regular (HUMULIN R,NOVOLIN R) 100 units/mL injection Inject 22 Units into the skin 3 (three) times daily before meals.   . pravastatin (PRAVACHOL) 40 MG tablet Take 40 mg by mouth daily.     No current facility-administered medications on file prior to visit.     Review of Systems: Constitutional: No fever, sweats, or shaking chills. No appetite or weight deficit. Skin: No rash, scaling, sores, lumps, or jaundice. HEENT: No visual changes or hearing deficit.No allergic rhinitis or sinusitis. Pulmonary: No unusual cough, sore throat, or orthopnea.No DOE or COPD. Cardiovascular: No coronary artery disease, angina, or myocardial infarction. No cardiac dysrhythmia, essential hypertension, or dyslipidemia. Gastrointestinal: No indigestion, dysphagia, abdominal pain, diarrhea, or constipation.No change in bowel habits.No nausea or vomiting. No melena or bright red blood per rectum. Genitourinary: Nourinaryfrequency, urgency, hematuria, or dysuria;early stage prostate cancer on active surveillance. Musculoskeletal: No arthralgias or myalgias; no joint swelling, pain, or instability. Hematologic: No bleeding tendency or easy bruisability. Endocrine: No intolerance to heat or cold; no thyroid disease;insulin requiring diabetes mellitus. Vascular: No peripheral arterial or venous thromboembolic disease. Psychological: No anxiety, depression, or mood changes; no mental health illnesses. Neurological: No dizziness, lightheadedness, syncope, or near syncopal episodes;paresthesia and tense edema involving the lower extremities..  Physical Examination: Vital  Signs: Body surface area is 2.45 meters squared.  Vitals:   11/30/17 1415  BP: (!) 166/57  Pulse: 64  Resp: 18  Temp: 97.8 F (36.6 C)  SpO2: 99%    Filed Weights   11/30/17 1415  Weight: 241 lb (109.3 kg)  Body mass index is 27.85 kg/m.  ECOG PERFORMANCE STATUS:1 Constitutional:Thomas Mcguire afully nourished and developedAfrican-American. He looks age appropriate. Heis friendly and cooperative without respiratory compromise at rest. Skin: No rashes, scaling, dryness, jaundice, or itching. HEENT: Head is normocephalic and atraumatic. Pupils are equal round and reactive to light and accommodation. Sclerae are anicteric. Conjunctivae are pale.Bilateral arcus senilis.No sinus tenderness nor oropharyngeal lesions. Lips without cracking or peeling; tongue without mass, inflammation, or nodularity. Mucous membranes are moist. He has dentures. Neck: Supple and symmetric. No jugular venous distention or thyromegaly. Trachea is midline. Lymphatics: No cervical or supraclavicular lymphadenopathy. No epitrochlear, axillary, or inguinal lymphadenopathy is appreciated. Respiratory/chest: Thorax is symmetrical. Breath sounds are clear to auscultation and percussion. Normal excursion and respiratory effort. Back: Symmetric without deformity or tenderness. Cardiovascular: Heart rate and rhythm are regular without murmurs, gallops, or rubs. Gastrointestinal: Abdomen is soft, nontender; no organomegaly. Bowel sounds are normoactive. No masses are appreciated. Extremities: In the lower extremities, there is no asymmetricswelling,erythema, tenderness, or cord formation.He has bilateral tense pitting edema (wearing support stockings). No clubbingorcyanosis, Hematologic: No petechiae, hematomas, or ecchymoses. Psychological: He is oriented to person,  place, and time; normalaffect. Neurological:There are no gross neurologic deficits.  Laboratory Results: November 30, 2017  Ref Range & Units 13:18 4wk ago  WBC Count 4.0 - 10.5 K/uL 22.9High   21.0High    RBC 4.22 - 5.81 MIL/uL 4.22  4.09Low    Hemoglobin 13.0 - 17.0 g/dL 11.1Low   10.8Low    HCT 39.0 - 52.0 % 35.3Low   34.1Low    MCV 80.0 - 100.0 fL 83.6  83.4   MCH 26.0 - 34.0 pg 26.3  26.4   MCHC 30.0 - 36.0 g/dL 31.4  31.7   RDW 11.5 - 15.5 % 16.0High   16.4High    Platelet Count 150 - 400 K/uL 164  140Low    nRBC 0.0 - 0.2 % 0.0  0.0   Neutrophils Relative % % 25  29   Neutro Abs 1.7 - 7.7 K/uL 5.7  6.2   Lymphocytes Relative % 57  51 CM  Comment: atyp lymph seen  Lymphs Abs 0.7 - 4.0 K/uL 13.1High   10.6High    Monocytes Relative % 17  19   Monocytes Absolute 0.1 - 1.0 K/uL 3.8High   4.0High    Eosinophils Relative % 1  1   Eosinophils Absolute 0.0 - 0.5 K/uL 0.1  0.2   Basophils Relative % 0  0   Basophils Absolute 0.0 - 0.1 K/uL 0.0  0.0   Immature Granulocytes % 0  0   Abs Immature Granulocytes 0.00 - 0.07 K/uL 0.05  0.04   Smudge Cells  PRESENT  PRESENT CM        Ref Range & Units 13:18 4wk ago 52mo ago 75yr ago 10yr ago  Sodium 135 - 145 mmol/L 139  142  142  142 R 136 R  Potassium 3.5 - 5.1 mmol/L 3.8  4.5  4.5  4.6 R 3.9 R  Chloride 98 - 111 mmol/L 96Low   108  107  105 R 101 R  CO2 22 - 32 mmol/L 28  22  24  29  R 28 R  Glucose, Bld 70 - 99 mg/dL 109High   115High   108High   143High   92 R  BUN 8 - 23 mg/dL 83High   58High   59High   37High  R 31High  R  Creatinine 0.61 - 1.24 mg/dL 5.38High Panic   3.98High Panic  CM 3.77High Panic  CM 1.66High  R 1.41 R  Comment: CRITICAL RESULT CALLED TO, READ BACK BY AND VERIFIED WITH: ROZ WINSTON,RN   Calcium 8.9 - 10.3 mg/dL 9.1  8.7Low   9.1  9.2 R 8.7 R  GFR, Est Non Af Am >60 mL/min 9Low   13Low   14Low   40Low  R 50Low  R  GFR, Est AFR Am >60 mL/min 11Low   16Low  CM 17Low  CM 47Low  R,      November 02, 2017  Ref Range & Units 13d ago 17mo ago 71yr ago 107yr ago  WBC Count 4.0 - 10.5 K/uL 21.0High   19.7High  R 11.0High   10.7High  R    RBC 4.22 - 5.81 MIL/uL 4.09Low   4.50 R 4.49  4.37 R  Hemoglobin 13.0 - 17.0 g/dL 10.8Low   11.8Low  R 12.2Low   12.0Low  R  HCT 39.0 - 52.0 % 34.1Low   37.4Low  R 37.6Low   37.0Low  R  MCV 80.0 - 100.0 fL 83.4  83.1 R 83.7 R 84.6  R  MCH 26.0 - 34.0 pg 26.4  26.2Low  R 27.2    MCHC 30.0 - 36.0 g/dL 31.7  31.5Low  R 32.4  32.6 R  RDW 11.5 - 15.5 % 16.4High   17.8High  R 16.1High   15.6High  R  Platelet Count 150 - 400 K/uL 140Low   147 R 134Low   188 R  nRBC 0.0 - 0.2 % 0.0      Neutrophils Relative % % 29  26     Neutro Abs 1.7 - 7.7 K/uL 6.2  5.1 R    Lymphocytes Relative % 51  69     Comment: ATYP SEEN  Lymphs Abs 0.7 - 4.0 K/uL 10.6High   13.7High  R    Monocytes Relative % 19  4     Monocytes Absolute 0.1 - 1.0 K/uL 4.0High   0.7 R    Eosinophils Relative % 1  1     Eosinophils Absolute 0.0 - 0.5 K/uL 0.2  0.2     Basophils Relative % 0  0     Basophils Absolute 0.0 - 0.1 K/uL 0.0  0.1 CM    Immature Granulocytes % 0      Abs Immature Granulocytes 0.00 - 0.07 K/uL 0.04      Smudge Cells  PRESENT        Ref Range & Units 13d ago 48mo ago 66yr ago 37yr ago  Sodium 135 - 145 mmol/L 142  142  142 R 136 R  Potassium 3.5 - 5.1 mmol/L 4.5  4.5  4.6 R 3.9 R  Chloride 98 - 111 mmol/L 108  107  105 R 101 R  CO2 22 - 32 mmol/L 22  24  29  R 28 R  Glucose, Bld 70 - 99 mg/dL 115High   108High   143High   92 R  BUN 8 - 23 mg/dL 58High   59High   37High  R 31High  R  Creatinine, Ser 0.61 - 1.24 mg/dL 3.98High Panic   3.77High Panic  CM 1.66High  R 1.41 R  Comment: REPEATED TO VERIFY  CRITICAL RESULT CALLED TO, READ BACK BY AND VERIFIED WITH: DR. [email or web address]  L [place]   Calcium 8.9 - 10.3 mg/dL 8.7Low   9.1  9.2 R 8.7 R  Total Protein 6.5 - 8.1 g/dL 6.7  7.6  6.7 R   Albumin 3.5 - 5.0 g/dL 3.5  4.0  3.5 R   AST 15 - 41 U/L 20  19  18  R   ALT 0 - 44 U/L 14  19  16  R   Alkaline Phosphatase 38 - 126 U/L 74  82  59 R   Total Bilirubin 0.3 - 1.2 mg/dL 0.6  0.5  0.4    GFR calc non Af Amer  >60 mL/min 13Low   14Low   40Low  R 50Low  R  GFR calc Af Amer >60 mL/min 16Low   17Low  CM 47Low  R, CM >60     Uric acid 5.8 Homocysteine 31.9 (0-15.0) Methylmalonic acid 560 (0-378)  October 14, 2017  Ref Range & Units 2wk ago 95yr ago 76yr ago  WBC Count 4.0 - 10.3 K/uL 19.7High   11.0High  R 10.7High  R  RBC 4.20 - 5.82 MIL/uL 4.50  4.49 R 4.37 R  Hemoglobin 13.0 - 17.1 g/dL 11.8Low   12.2Low  R 12.0Low  R  HCT 38.4 - 49.9 % 37.4Low   37.6Low  R 37.0Low  R  MCV 79.3 - 98.0 fL 83.1  83.7 R 84.6 R  MCH 27.2 - 33.4 pg 26.2Low   27.2 R   MCHC 32.0 - 36.0 g/dL 31.5Low   32.4 R 32.6 R  RDW 11.0 - 14.6 % 17.8High   16.1High  R 15.6High  R  Platelet Count 140 - 400 K/uL 147  134Low  R 188 R  Smear Review  Few shistocytes     Neutrophils Relative % % 26     Neutro Abs 1.5 - 6.5 K/uL 5.1     Lymphocytes Relative % 69     Lymphs Abs 0.9 - 3.3 K/uL 13.7High      Monocytes Relative % 4     Monocytes Absolute 0.1 - 0.9 K/uL 0.7     Eosinophils Relative % 1     Eosinophils Absolute 0.0 - 0.5 K/uL 0.2     Basophils Relative % 0     Basophils Absolute 0.0 - 0.1 K/uL 0.1       Ref Range & Units 2wk ago 61yr ago 42yr ago  Sodium 135 - 145 mmol/L 142  142 R 136 R  Potassium 3.5 - 5.1 mmol/L 4.5  4.6 R 3.9 R  Chloride 98 - 111 mmol/L 107  105 R 101 R  CO2 22 - 32 mmol/L 24  29 R 28 R  Glucose, Bld 70 - 99 mg/dL 108High   143High   92 R  BUN 8 - 23 mg/dL 59High   37High  R 31High  R  Creatinine, Ser 0.61 - 1.24 mg/dL 3.77High Panic   1.66High  R 1.41 R  Comment: CRITICAL RESULT CALLED TO, READ BACK BY AND VERIFIED WITH: Dr. Ladona Ridgel at 1146. RQ   Calcium 8.9 - 10.3 mg/dL 9.1  9.2 R 8.7 R  Total Protein 6.5 - 8.1 g/dL 7.6  6.7 R   Albumin 3.5 - 5.0 g/dL 4.0  3.5 R   AST 15 - 41 U/L 19  18 R   ALT 0 - 44 U/L 19  16 R   Alkaline Phosphatase 38 - 126 U/L 82  59 R   Total Bilirubin 0.3 - 1.2 mg/dL 0.5  0.4    GFR calc non Af Amer >60 mL/min 14Low   40Low  R  50Low  R  GFR calc Af Amer >60 mL/min 17Low      Reticulocyte count 1% Erythropoietin 9.1 LDH 269 Haptoglobin 96 Hemoglobin electrophoresis: Normal adult hemoglobin Vitamin B12 162 (180-914) BCR-ABL 1 balance translocation: Not detected Serum protein electrophoresis, immunofixation, and light chain analysis: There is a faint band in the gamma region suspicious for a monoclonal immunoglobulin. Quantitative immunoglobulins:  IgG 1089 IgA 165 IgM 28 Free kappa light chain 137.1 Free lambda light chain 52.0 Free kappa/lambda light chain ratio: 2.64 (0.26-1.65). Component 2wk ago  Path Review Reviewed by Arrie Aran L. Melina Copa, M.D.   Comment: 10.4.19  ABSOLUTE LYMPHOCYTOSIS AND NORMOCYTIC ANEMIA. FLOW CYTOMETRY PENDING.  Performed at [facility], [place] [address], [place], [place] [address]   Hepatitis C antibody: <0.1: Negative.  Summary/Assessment: In the setting of leukocytosis/lymphocytosis and anemia over an indefinite period of time, consistent with vitamin B01 and folic acid deficiency, now on replacement with parenteral vitamin B12/folic acid, and chronic kidney disease, he presents now to discuss the results of of his laboratory studies from today and recommendations.  He denies any recent infection, surgery, intense physical or emotional stress, new medication, or family history of any blood  disorder. On September 13, 2017 a complete blood count showed hemoglobin 10.1 MCV 85.7 MCH 25.6 WBC 19.23; platelets 122,000. On October 11, 2017 a complete blood count showed hemoglobin 11.1 hematocrit 35.1 MCV 83 MCH 26.3 WBC 20.4 with 25% neutrophils 52% lymphocytes 22% monocytes 1% eosinophil; platelets 142,000. Serum iron 52 TIBC 263 iron saturation 20% serum ferritin 78 intact PTH 273.  He was seen and evaluated previously by Dr. Duncan Mcguire, for stage IV chronic kidney injury. His most recent colonoscopy was 3 years earlier reportedly normal. He has chronic  swelling of his legs and wears support stockings to reduce the swelling. At the time of his initial visit on October 4, we discussed in detail the results of his most recent laboratory studies from October which revealed leukocytosis/lymphocytosis, anemia, and a transiently low platelet count.  We discussed previously in the results of his laboratory studies from October 4.   The results of his complete blood count show a persistently elevated white blood cell count with lymphocytic shift.  He is mildly anemic, 11.8 g/dL. His platelet count is normal 147,000. There was a faint band in the gamma region of  serum protein electrophoresis.  His immunofixation pattern however was normal.  His quantitative immunoglobulins were normal.  The kappa/lambda light chain analysis showed a combined elevation of both kappa and lambda free light chains. The kappa/lambda light chain ratio was elevated additionally.  vitamin B12 was low 162 (180-914).  The folic acid levels was normal.  A hemoglobin electrophoresis revealed a normal adult hemoglobin pattern. There was no evidence of hemolysis. His serum creatinine was 3.77.   His baseline creatinine is 4.0.  He has stage IV renal disease.  His liver function and electrolytes were normal.  A serum erythropoietin level was 9.1.  His iron studies were somewhat difficult to interpret since ferritin is an acute phase reactant.  Ferritin was 59.  Iron 48 TIBC 305 iron saturation 16%. They are more consistent with poor iron utilization as in chronic disease as opposed to iron deficiency.    On October 23 a repeat complete blood count was performed.  The results of that study revealed hemoglobin 10.8 hematocrit 34.1 MCV 83.4 MCH 26.4 RDW 16.4 WBC 21.0 with 29% neutrophils 51% lymphocytes 19% monocytes 1% eosinophil; platelets 140,000.  Both Homocysteine and methylmalonic acid were markedly elevated consistent with vitamin Z76 and folic acid deficiency.  BCR-ABL balanced gene  translocation by FISH was not identified.  On peripheral blood, immunophenotypic flow cytometry revealed CD5 positive monoclonal B-cell population with kappa restricted B cells that expressed CD19, CD20, CD5, and CD23 which comprise 82% of all lymphocytes.  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 FISH studies for molecular profiling and t(11;14) translocation was requested.  Previously his vitamin B12 level was low.  Both methylmalonic acid and serum homocysteine were significantly elevated consistent with vitamin B34 and folic acid deficiency.  Because of his worsening anemia, and chronic stable thrombocytopenia, in the setting of chronic kidney injury (stage IV), it was felt that aggressive vitamin L93 and folic acid replacement be initiated. Literature was given regarding the natural history and behavior of B-cell chronic lymphocytic leukemia.  At the time of his last visit, he was given a prescription for vitamin B12: 1000 mcg subcu once daily for 7 days; followed by vitamin B12: 1000 mcg subcu once weekly for 4 weeks; followed by once monthly thereafter. He was given also a calendar to facilitate vitamin B12  administration. A prescription for folic acid: 1 mg once daily with food was written.  In the interim since he denies any appetite or weight deficit. He has no rash or itching. He has no unusual headache, dizziness, lightheadedness, syncope, or near syncopal episodes. He denies newvisual changes or hearing deficit. He reports no rash or itching.  He reports no unusual cough, sore throat, or orthopnea. He denies dyspnea either at rest or on minimal exertion. There is no pain or difficulty in swallowing. No fever, shaking chills, sweats, or flulike symptoms are reported. He has no heartburn or indigestion. He has no chest or abdominal pain. There is no nausea, vomiting, diarrhea, or constipation. He moves his bowels once daily.  He denies melena or bright red blood per rectum. No urinary frequency, urgency, hematuria, or dysuria are evident. There are no new focal areas of bone, joint, muscle pain. He has paresthesias involving the lower extremities consistent with diabetic neuropathy/polyneuropathy.   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   Recommendation/Plan: The results of his laboratory studies from today were reviewed and discussed in detail.  Those results are outlined above.  His platelet count is now normal.  His hemoglobin is increasing appropriately.  In the setting of B-cell chronic lymphocytic leukemia, his absolute white blood cell count is stable.  Because of his worsening renal failure, he was recommended that he continue close monitoring and follow-up with Dr. Lawson Radar, nephrology.  He was advised to continue parenteral vitamin M22 and folic acid as previously outlined and arranged.  His treatment will likely be indefinite.  Although he has received the seasonal influenza vaccine, he was reminded to stay current with both pneumococcal vaccines (2) if not already administered through his primary care office.  The molecular profiling on peripheral blood by FISH is still pending.  Barring any unforeseen complications, his next scheduled doctor visit with laboratory studies is on December 18 with Dr. Julieanne Manson since I am leaving the practice.  He was advised to call us in the interim should any new or untoward problems arise.  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 At least 50% of that time was spent in face-to-face discussion, reviewing laboratory studies, counseling, and answering questions. There was ample time was allotted to answer all questions.  This note was dictated using voice activated technology/software.  Unfortunately, typographical errors are not uncommon, and transcription is subject to mistakes and regrettably misinterpretation.  If necessary, clarification of the above information can be discussed with me at any time.  FOLLOW UP: AS DIRECTED   [id number] Philip Aspen, Mcguire Dron Bhandari,Mcguire   Thomas Mcguire  Hematology/Oncology [facility] [address] [place], [place] [address] Office: [phone] BWIO: [phone]

## 2017-12-28 ENCOUNTER — Inpatient Hospital Stay: Payer: Medicare Other | Attending: Hematology and Oncology

## 2017-12-28 ENCOUNTER — Inpatient Hospital Stay (HOSPITAL_BASED_OUTPATIENT_CLINIC_OR_DEPARTMENT_OTHER): Payer: Medicare Other | Admitting: Oncology

## 2017-12-28 ENCOUNTER — Telehealth: Payer: Self-pay

## 2017-12-28 VITALS — BP 139/47 | HR 61 | Temp 98.0°F | Resp 18 | Ht 78.0 in | Wt 235.5 lb

## 2017-12-28 DIAGNOSIS — C9110 Chronic lymphocytic leukemia of B-cell type not having achieved remission: Secondary | ICD-10-CM | POA: Insufficient documentation

## 2017-12-28 DIAGNOSIS — Z23 Encounter for immunization: Secondary | ICD-10-CM | POA: Insufficient documentation

## 2017-12-28 DIAGNOSIS — E119 Type 2 diabetes mellitus without complications: Secondary | ICD-10-CM

## 2017-12-28 DIAGNOSIS — D649 Anemia, unspecified: Secondary | ICD-10-CM | POA: Insufficient documentation

## 2017-12-28 DIAGNOSIS — I10 Essential (primary) hypertension: Secondary | ICD-10-CM | POA: Diagnosis not present

## 2017-12-28 DIAGNOSIS — N189 Chronic kidney disease, unspecified: Secondary | ICD-10-CM

## 2017-12-28 LAB — CBC WITH DIFFERENTIAL (CANCER CENTER ONLY)
Abs Immature Granulocytes: 0.05 10*3/uL (ref 0.00–0.07)
Basophils Absolute: 0 10*3/uL (ref 0.0–0.1)
Basophils Relative: 0 %
Eosinophils Absolute: 0.2 10*3/uL (ref 0.0–0.5)
Eosinophils Relative: 1 %
HCT: 38.2 % — ABNORMAL LOW (ref 39.0–52.0)
Hemoglobin: 11.8 g/dL — ABNORMAL LOW (ref 13.0–17.0)
Immature Granulocytes: 0 %
Lymphocytes Relative: 60 %
Lymphs Abs: 14.4 10*3/uL — ABNORMAL HIGH (ref 0.7–4.0)
MCH: 25.8 pg — ABNORMAL LOW (ref 26.0–34.0)
MCHC: 30.9 g/dL (ref 30.0–36.0)
MCV: 83.4 fL (ref 80.0–100.0)
Monocytes Absolute: 3.5 10*3/uL — ABNORMAL HIGH (ref 0.1–1.0)
Monocytes Relative: 14 %
Neutro Abs: 6.1 10*3/uL (ref 1.7–7.7)
Neutrophils Relative %: 25 %
Platelet Count: 174 10*3/uL (ref 150–400)
RBC: 4.58 MIL/uL (ref 4.22–5.81)
RDW: 15.9 % — ABNORMAL HIGH (ref 11.5–15.5)
WBC Count: 24.3 10*3/uL — ABNORMAL HIGH (ref 4.0–10.5)
nRBC: 0 % (ref 0.0–0.2)

## 2017-12-28 LAB — BASIC METABOLIC PANEL - CANCER CENTER ONLY
Anion gap: 15 (ref 5–15)
BUN: 88 mg/dL — ABNORMAL HIGH (ref 8–23)
CO2: 33 mmol/L — ABNORMAL HIGH (ref 22–32)
Calcium: 9.3 mg/dL (ref 8.9–10.3)
Chloride: 91 mmol/L — ABNORMAL LOW (ref 98–111)
Creatinine: 5.72 mg/dL (ref 0.61–1.24)
GFR, Est AFR Am: 10 mL/min — ABNORMAL LOW (ref >60)
GFR, Estimated: 9 mL/min — ABNORMAL LOW (ref >60)
Glucose, Bld: 73 mg/dL (ref 70–99)
Potassium: 3.3 mmol/L — ABNORMAL LOW (ref 3.5–5.1)
Sodium: 139 mmol/L (ref 135–145)

## 2017-12-28 MED ORDER — PNEUMOCOCCAL VAC POLYVALENT 25 MCG/0.5ML IJ INJ
0.5000 mL | INJECTION | Freq: Once | INTRAMUSCULAR | Status: AC
  Administered 2017-12-28: 0.5 mL via INTRAMUSCULAR
  Filled 2017-12-28: qty 0.5

## 2017-12-28 NOTE — Progress Notes (Signed)
  [facility] OFFICE PROGRESS NOTE   Diagnosis: Anemia, lymphocytosis  INTERVAL HISTORY:   Mr. Hannen was evaluated by Dr. Audelia Hives for anemia and lymphocytosis.  He is referred to me to continue hematology care. Mr. Mcneel has no complaint.  He reports improvement in edema with diuresis.  He relates weight loss to diuresis. Evaluation by Dr. Audelia Hives included a normal serum immunofixation, combined elevation of kappa and lambda free light chains, and a low vitamin B12 level.  Peripheral blood flow cytometry confirmed a CD5 kappa restricted monoclonal B-cell population with expression of CD19, CD20, CD5, and CD23.  The immunophenotype is consistent with chronic lymphocytic leukemia. Mr. Mielke is maintained on vitamin D22 and folic acid replacement.  Review of systems: No fever, night sweats, recent infections, or anorexia Objective:  Vital signs in last 24 hours:  Blood pressure (!) 139/47, pulse 61, temperature 98 F (36.7 C), temperature source Oral, resp. rate 18, height 6\' 6"  (1.981 m), weight 235 lb 8 oz (106.8 kg), SpO2 99 %.    HEENT: Neck without mass Lymphatics: Pea-sized right scalene node, 1 cm soft mobile left axillary node, no other cervical, supraclavicular, axillary, or inguinal nodes Resp: Lungs clear bilaterally Cardio: Regular rate and rhythm GI: No hepatosplenomegaly Vascular: No leg edema     Lab Results:  Lab Results  Component Value Date   WBC 24.3 (H) 12/28/2017   HGB 11.8 (L) 12/28/2017   HCT 38.2 (L) 12/28/2017   MCV 83.4 12/28/2017   PLT 174 12/28/2017   NEUTROABS 6.1 12/28/2017   Blood smear: Few ovalocytes, target cells, and teardrop cells.  The platelets appear normal in number.  There are increased numbers of mature appearing lymphocytes CMP  Lab Results  Component Value Date   NA 139 12/28/2017   K 3.3 (L) 12/28/2017   CL 91 (L) 12/28/2017   CO2 33 (H) 12/28/2017   GLUCOSE 73 12/28/2017   BUN 88 (H) 12/28/2017   CREATININE 5.72 (HH) 12/28/2017   CALCIUM 9.3 12/28/2017   PROT 6.7 11/02/2017   ALBUMIN 3.5 11/02/2017   AST 20 11/02/2017   ALT 14 11/02/2017   ALKPHOS 74 11/02/2017   BILITOT 0.6 11/02/2017   GFRNONAA 9 (L) 12/28/2017   GFRAA 10 (L) 12/28/2017     Medications: I have reviewed the patient's current medications.   Assessment/Plan:  1.  Chronic lymphocytic leukemia 2.  Anemia 3.  Chronic renal failure 4.  Vitamin G25, folic acid?  Deficiency 5.  Diabetes 6.  Hypertension  Disposition: Mr. Welshans has been diagnosed with chronic lymphocytic leukemia.  He is asymptomatic from the CLL.  There is no indication for treatment.  He understands the increased risk for infections in the setting of CLL.  He will seek medical attention for symptoms of an infection.  He will remain up-to-date on the influenza vaccine.  We repeated the 23 valent pneumococcal vaccine today as he last received this in 2010  He will continue vitamin K27 and folic acid replacement.  It is okay to switch the vitamin B12 to oral therapy if he prefers this route.  He will continue follow-up with Dr. Sharlett Iles and nephrology for management of diabetes, renal failure, and hypertension.  He will return for an office visit and CBC in 4 months.  25 minutes were spent with the patient today.  The majority of the time was used for counseling and coordination of care.  Betsy Coder, MD  12/28/2017  11:15 AM

## 2017-12-28 NOTE — Patient Instructions (Signed)
Please provide copy of your Advanced Directive/Living Will at next visit to scan into chart

## 2017-12-28 NOTE — Telephone Encounter (Signed)
Printed avs and calender of upcoming appointment. Per 12/189 los

## 2018-01-19 LAB — FISH,CLL PROGNOSTIC PANEL

## 2018-02-03 ENCOUNTER — Other Ambulatory Visit: Payer: Self-pay

## 2018-02-03 DIAGNOSIS — N185 Chronic kidney disease, stage 5: Secondary | ICD-10-CM

## 2018-03-04 ENCOUNTER — Other Ambulatory Visit: Payer: Self-pay | Admitting: Hematology and Oncology

## 2018-03-04 DIAGNOSIS — C9110 Chronic lymphocytic leukemia of B-cell type not having achieved remission: Secondary | ICD-10-CM

## 2018-03-04 DIAGNOSIS — E538 Deficiency of other specified B group vitamins: Secondary | ICD-10-CM

## 2018-03-04 DIAGNOSIS — D649 Anemia, unspecified: Secondary | ICD-10-CM

## 2018-03-08 ENCOUNTER — Other Ambulatory Visit: Payer: Self-pay

## 2018-03-08 ENCOUNTER — Ambulatory Visit: Payer: Medicare Other | Admitting: Vascular Surgery

## 2018-03-08 ENCOUNTER — Other Ambulatory Visit: Payer: Self-pay | Admitting: *Deleted

## 2018-03-08 ENCOUNTER — Encounter: Payer: Self-pay | Admitting: Vascular Surgery

## 2018-03-08 ENCOUNTER — Ambulatory Visit (INDEPENDENT_AMBULATORY_CARE_PROVIDER_SITE_OTHER)
Admission: RE | Admit: 2018-03-08 | Discharge: 2018-03-08 | Disposition: A | Discharge status: Home / Self Care | Payer: Medicare Other | Source: Ambulatory Visit | Attending: Vascular Surgery | Admitting: Vascular Surgery

## 2018-03-08 ENCOUNTER — Ambulatory Visit (HOSPITAL_COMMUNITY)
Admission: RE | Admit: 2018-03-08 | Discharge: 2018-03-08 | Disposition: A | Discharge status: Home / Self Care | Payer: Medicare Other | Source: Ambulatory Visit | Attending: Vascular Surgery | Admitting: Vascular Surgery

## 2018-03-08 ENCOUNTER — Other Ambulatory Visit (HOSPITAL_COMMUNITY): Payer: Medicare Other

## 2018-03-08 ENCOUNTER — Encounter: Payer: Self-pay | Admitting: *Deleted

## 2018-03-08 VITALS — BP 133/68 | HR 62 | Resp 18 | Ht 78.0 in | Wt 217.9 lb

## 2018-03-08 DIAGNOSIS — N185 Chronic kidney disease, stage 5: Secondary | ICD-10-CM | POA: Diagnosis present

## 2018-03-08 DIAGNOSIS — N184 Chronic kidney disease, stage 4 (severe): Secondary | ICD-10-CM | POA: Diagnosis not present

## 2018-03-08 NOTE — Progress Notes (Signed)
REASON FOR CONSULT:    Needs hemodialysis access ASAP.  The consult is requested by Dr. Carolin Sicks.  ASSESSMENT & PLAN:   STAGE IV CHRONIC KIDNEY DISEASE: This patient presents for new hemodialysis access.  We are asked to only place a fistula not place a graft if the fistula was not possible.  Based on his vein map, I think his best chance for a fistula would be a right radiocephalic fistula or right brachiocephalic fistula.  I have discussed the indications for placement of a fistula.  We have also discussed the potential complications including, but not limited to, failure of the fistula to mature, the need for interventions, or the need to place new access if this does not work.  We also discussed steal symptoms, arm swelling, and wound healing problems.  He is not yet on dialysis.  His surgery has been scheduled for 04/03/2018.  Deitra Mayo, MD, FACS [facility] [phone] Office: [phone]   HPI:   Thomas Mcguire is a pleasant 78 y.o. male, who is referred for evaluation for hemodialysis access.  I reviewed the notes from the referring office.  The patient was seen on 01/25/2018.  He has chronic kidney disease secondary to hypertension and diabetes.  He has a history of prostate cancer that is now in remission.  His creatinine has been gradually worsening.  At the time of this visit he was noted to have stage IV chronic kidney disease.  His GFR at that time was 12.  He also has secondary hyperparathyroidism.  Of note, we were asked to place an AV fistula.  If an AV fistula was not possible we were to wait before placing an AV graft.  On my history, the patient denies any uremic symptoms.  Specifically, he denies nausea, vomiting, fatigue, anorexia, or palpitations.  He is not on any blood thinners except for aspirin.  Past Medical History:  Diagnosis Date  . Arthritis   . Chronic renal insufficiency    CHECKED Q3MOS PER PT. NO TREATMENT  . Diabetes mellitus   . Gout    Has not  had recently- 08/24/11  . Hyperlipidemia   . Hypertension   . Neuromuscular disorder ([place])    Neuropathy    Family History  Problem Relation Age of Onset  . Colon cancer Neg Hx     SOCIAL HISTORY: Social History   Socioeconomic History  . Marital status: Married    Spouse name: [name]  . Number of children: Not on file  . Years of education: Not on file  . Highest education level: Not on file  Occupational History  . Not on file  Social Needs  . Financial resource strain: Not on file  . Food insecurity:    Worry: Not on file    Inability: Not on file  . Transportation needs:    Medical: Not on file    Non-medical: Not on file  Tobacco Use  . Smoking status: Former Smoker    Last attempt to quit: 03/11/1996    Years since quitting: 22.0  . Smokeless tobacco: Never Used  Substance and Sexual Activity  . Alcohol use: No  . Drug use: No  . Sexual activity: Not Currently  Lifestyle  . Physical activity:    Days per week: Not on file    Minutes per session: Not on file  . Stress: Not on file  Relationships  . Social connections:    Talks on phone: Not on file    Gets together:  Not on file    Attends religious service: Not on file    Active member of club or organization: Not on file    Attends meetings of clubs or organizations: Not on file    Relationship status: Not on file  . Intimate partner violence:    Fear of current or ex partner: Not on file    Emotionally abused: Not on file    Physically abused: Not on file    Forced sexual activity: Not on file  Other Topics Concern  . Not on file  Social History Narrative  . Not on file    No Known Allergies  Current Outpatient Medications  Medication Sig Dispense Refill  . allopurinol (ZYLOPRIM) 300 MG tablet Take 300 mg by mouth daily.      Marland Kitchen amLODipine (NORVASC) 5 MG tablet Take 5 mg by mouth 2 (two) times daily.  4  . aspirin 81 MG tablet Take 81 mg by mouth daily.    . calcitRIOL (ROCALTROL) 0.25 MCG  capsule Take 1 capsule by mouth daily.  5  . carvedilol (COREG) 25 MG tablet Take 12.5 mg by mouth 2 (two) times daily with a meal.     . COD LIVER OIL PO Take 1 capsule by mouth daily.     . cyanocobalamin (,VITAMIN B-12,) 1000 MCG/ML injection Vitamin B12 injection: 1000 mcg (1 mL) by subcu injection once daily for 7 days; then once weekly for 4 weeks; then once monthly thereafter.  7 07/15/2010 12 mL 1  . doxazosin (CARDURA) 2 MG tablet Take 1 mg by mouth at bedtime.     . ferrous sulfate 325 (65 FE) MG tablet Take 325 mg by mouth 2 (two) times daily with a meal.    . folic acid (FOLVITE) 1 MG tablet TAKE 1 TABLET BY MOUTH EVERY DAY 90 tablet 0  . furosemide (LASIX) 40 MG tablet Take 80 mg by mouth 2 (two) times daily.     Marland Kitchen guanFACINE (TENEX) 2 MG tablet Take 2 mg by mouth every morning.     . hydrochlorothiazide (HYDRODIURIL) 25 MG tablet Take 25 mg by mouth daily.  3  . HYDROcodone-acetaminophen (VICODIN) 5-500 MG per tablet Take 1-2 tablets by mouth daily as needed. For pain    . insulin NPH (HUMULIN N,NOVOLIN N) 100 UNIT/ML injection Inject 50 Units into the skin 2 (two) times daily. Inject 50 units in am and 50 units in pm    . insulin regular (HUMULIN R,NOVOLIN R) 100 units/mL injection Inject 22 Units into the skin 3 (three) times daily before meals.     . isosorbide mononitrate (IMDUR) 30 MG 24 hr tablet Take 30 mg by mouth daily.  6  . potassium chloride (K-DUR) 10 MEQ tablet Take 10 mEq by mouth daily.  3  . pravastatin (PRAVACHOL) 40 MG tablet Take 40 mg by mouth daily.      . sodium bicarbonate 650 MG tablet Take 650 mg by mouth 2 (two) times daily.  5   No current facility-administered medications for this visit.     REVIEW OF SYSTEMS:  [X]  denotes positive finding, [ ]  denotes negative finding Cardiac  Comments:  Chest pain or chest pressure:    Shortness of breath upon exertion:    Short of breath when lying flat:    Irregular heart rhythm:        Vascular    Pain in  calf, thigh, or hip brought on by ambulation:    Pain in  feet at night that wakes you up from your sleep:     Blood clot in your veins:    Leg swelling:         Pulmonary    Oxygen at home:    Productive cough:     Wheezing:         Neurologic    Sudden weakness in arms or legs:     Sudden numbness in arms or legs:     Sudden onset of difficulty speaking or slurred speech:    Temporary loss of vision in one eye:     Problems with dizziness:         Gastrointestinal    Blood in stool:     Vomited blood:         Genitourinary    Burning when urinating:     Blood in urine:        Psychiatric    Major depression:         Hematologic    Bleeding problems:    Problems with blood clotting too easily:        Skin    Rashes or ulcers:        Constitutional    Fever or chills:     PHYSICAL EXAM:   Vitals:   03/08/18 1527  BP: 133/68  Pulse: 62  Resp: 18  SpO2: 98%  Weight: 217 lb 14.8 oz (98.9 kg)  Height: 6\' 6"  (1.981 m)   GENERAL: The patient is a well-nourished male, in no acute distress. The vital signs are documented above. CARDIAC: There is a regular rate and rhythm.  VASCULAR: I do not detect carotid bruits. He has palpable brachial and radial pulses bilaterally. On exam, he does appear to have a reasonable forearm cephalic vein on the right and upper arm cephalic vein on the right. PULMONARY: There is good air exchange bilaterally without wheezing or rales. ABDOMEN: Soft and non-tender with normal pitched bowel sounds.  MUSCULOSKELETAL: There are no major deformities or cyanosis. NEUROLOGIC: No focal weakness or paresthesias are detected. SKIN: There are no ulcers or rashes noted. PSYCHIATRIC: The patient has a normal affect.  DATA:    ARTERIAL DUPLEX: I have independently interpreted his arterial duplex scan today.  On the left side there is a triphasic radial and ulnar waveform.  The brachial artery measures 0.48 cm in diameter.  On the right side  there is a triphasic radial and ulnar waveform.  The brachial artery measures 0.45 cm in diameter.  VEIN MAP: I have independently interpreted his vein map today.  On the left side, the forearm cephalic vein is quite small.  The upper arm cephalic vein is also small.  The basilic vein on the left looks reasonable in size.  On the right side the forearm cephalic vein looks reasonable in size.  The upper arm cephalic vein is only small at the shoulder.  The basilic vein on the right looks reasonable in size.

## 2018-03-09 DIAGNOSIS — M5134 Other intervertebral disc degeneration, thoracic region: Secondary | ICD-10-CM | POA: Insufficient documentation

## 2018-03-09 DIAGNOSIS — M546 Pain in thoracic spine: Secondary | ICD-10-CM | POA: Insufficient documentation

## 2018-03-25 ENCOUNTER — Other Ambulatory Visit: Payer: Self-pay | Admitting: Hematology and Oncology

## 2018-03-28 NOTE — Telephone Encounter (Addendum)
Dr. Benay Spice please advise refill.  Currently monthly injections.   Correction for 04-06-2018 Quicknote. Medication requested for Cyanocobalamin or Vitamin B12 injections was previously ordered by Dr. Audelia Hives.  Patient does not take Lovenox.

## 2018-03-29 ENCOUNTER — Telehealth: Payer: Self-pay | Admitting: Vascular Surgery

## 2018-03-29 NOTE — Telephone Encounter (Signed)
I called patient to cancel upcoming surgery with Dr. Scot Dock on 3/23.  No answer.  Left voice message to return call.   Thurston Hole., LPN

## 2018-03-30 NOTE — Telephone Encounter (Signed)
I called and spoke to patient's wife.  She verbalized understanding and will make pt aware.  Thurston Hole., LPN

## 2018-04-03 ENCOUNTER — Ambulatory Visit: Admit: 2018-04-03 | Payer: Medicare Other | Admitting: Vascular Surgery

## 2018-04-03 SURGERY — ARTERIOVENOUS (AV) FISTULA CREATION
Anesthesia: Monitor Anesthesia Care | Laterality: Right

## 2018-04-07 NOTE — Telephone Encounter (Addendum)
Verbal order received and read back from Dr. Benay Spice for Thomas Mcguire to contact PCP in reference to refill request.  Dr.  Benay Spice recommends continue B12 injections monthly or change to oral equivalent to injections: Cyanocobalamin 1000 mcg by mouth daily.  Called patient providing order instructions at this time.  "Thomas Mcguire [phone]).  I have enough to last the rest of the year.  Did not ask [facility] to refill.  Self inject B12 first Monday of every month.  I'm diabetic with no problems giving injections.  Could you let my PCP know before my telephone appointment next week."  Denies further questions or needs at this time.

## 2018-04-25 ENCOUNTER — Telehealth: Payer: Self-pay | Admitting: Oncology

## 2018-04-25 NOTE — Telephone Encounter (Signed)
Scheduled June appt per sch msg. Mailed printout  °

## 2018-04-27 ENCOUNTER — Ambulatory Visit: Payer: Medicare Other | Admitting: Oncology

## 2018-04-27 ENCOUNTER — Other Ambulatory Visit: Payer: Medicare Other

## 2018-05-03 ENCOUNTER — Other Ambulatory Visit: Payer: Self-pay | Admitting: *Deleted

## 2018-05-03 ENCOUNTER — Telehealth: Payer: Self-pay | Admitting: *Deleted

## 2018-05-03 NOTE — Telephone Encounter (Signed)
Spoke with Shaquina at [facility] and was advised that patient now has CKD Stage 5. Creatine with labs was 4.51 and GFR 14%. Advised to procede with surgery schedule.    Spoke with patients wife Vira Agar and arranged for Right AFV Creation to be done on 05/23/18. Was advised on the time that he would need to be at hospital.

## 2018-05-22 ENCOUNTER — Other Ambulatory Visit: Payer: Self-pay

## 2018-05-22 ENCOUNTER — Other Ambulatory Visit (HOSPITAL_COMMUNITY)
Admission: RE | Admit: 2018-05-22 | Discharge: 2018-05-22 | Disposition: A | Discharge status: Home / Self Care | Payer: Medicare Other | Source: Ambulatory Visit | Attending: Vascular Surgery | Admitting: Vascular Surgery

## 2018-05-22 ENCOUNTER — Encounter (HOSPITAL_COMMUNITY): Payer: Self-pay | Admitting: *Deleted

## 2018-05-22 DIAGNOSIS — Z1159 Encounter for screening for other viral diseases: Secondary | ICD-10-CM | POA: Diagnosis present

## 2018-05-22 NOTE — Progress Notes (Signed)
Spoke with pt for pre-op call. Pt denies cardiac history. Pt is a type 2 diabetic. Last A1C was 6.1 a month ago per pt. Pt states his fasting blood sugar is usually between 93-110. Pt instructed to take 1/2 of his regular dose of Humulin N this evening, he will take 25 units. Pt instructed to do the same in the AM prior to surgery. Instructed pt to check his blood sugar when he gets up and every 2 hours until he leaves for the hospital. If blood sugar is >220 take 1/2 of usual correction dose of Humulin R insulin. If blood sugar is 70 or below, treat with 1/2 cup of clear juice (apple or cranberry) and recheck blood sugar 15 minutes after drinking juice. If blood sugar continues to be 70 or below, call the Short Stay department and ask to speak to a nurse. Pt voiced understanding.  Pt had Covid 19 testing done this afternoon, pt knows to self quarantine.    Coronavirus Screening  Have you experienced the following symptoms:  Cough NO Fever (>100.59F)  NO Runny nose NO Sore throat NO  Difficulty breathing/shortness of breath  NO  Have you or a family member traveled in the last 14 days and where? NO    Patient reminded that hospital visitation restrictions are in effect and the importance of the restrictions.

## 2018-05-23 ENCOUNTER — Encounter (HOSPITAL_COMMUNITY): Payer: Self-pay

## 2018-05-23 ENCOUNTER — Ambulatory Visit (HOSPITAL_COMMUNITY)
Admission: RE | Admit: 2018-05-23 | Discharge: 2018-05-23 | Disposition: A | Discharge status: Home / Self Care | Payer: Medicare Other | Attending: Vascular Surgery | Admitting: Vascular Surgery

## 2018-05-23 ENCOUNTER — Ambulatory Visit (HOSPITAL_COMMUNITY): Payer: Medicare Other | Admitting: Anesthesiology

## 2018-05-23 ENCOUNTER — Encounter (HOSPITAL_COMMUNITY)
Admission: RE | Disposition: A | Discharge status: Home / Self Care | Payer: Self-pay | Source: Home / Self Care | Attending: Vascular Surgery

## 2018-05-23 ENCOUNTER — Other Ambulatory Visit: Payer: Self-pay

## 2018-05-23 DIAGNOSIS — M109 Gout, unspecified: Secondary | ICD-10-CM | POA: Insufficient documentation

## 2018-05-23 DIAGNOSIS — M1990 Unspecified osteoarthritis, unspecified site: Secondary | ICD-10-CM | POA: Insufficient documentation

## 2018-05-23 DIAGNOSIS — E1122 Type 2 diabetes mellitus with diabetic chronic kidney disease: Secondary | ICD-10-CM | POA: Insufficient documentation

## 2018-05-23 DIAGNOSIS — I129 Hypertensive chronic kidney disease with stage 1 through stage 4 chronic kidney disease, or unspecified chronic kidney disease: Secondary | ICD-10-CM | POA: Diagnosis present

## 2018-05-23 DIAGNOSIS — Z87891 Personal history of nicotine dependence: Secondary | ICD-10-CM | POA: Diagnosis not present

## 2018-05-23 DIAGNOSIS — Z794 Long term (current) use of insulin: Secondary | ICD-10-CM | POA: Diagnosis not present

## 2018-05-23 DIAGNOSIS — N184 Chronic kidney disease, stage 4 (severe): Secondary | ICD-10-CM

## 2018-05-23 DIAGNOSIS — Z1159 Encounter for screening for other viral diseases: Secondary | ICD-10-CM | POA: Insufficient documentation

## 2018-05-23 DIAGNOSIS — N2581 Secondary hyperparathyroidism of renal origin: Secondary | ICD-10-CM | POA: Insufficient documentation

## 2018-05-23 DIAGNOSIS — E1140 Type 2 diabetes mellitus with diabetic neuropathy, unspecified: Secondary | ICD-10-CM | POA: Diagnosis not present

## 2018-05-23 DIAGNOSIS — I447 Left bundle-branch block, unspecified: Secondary | ICD-10-CM | POA: Insufficient documentation

## 2018-05-23 DIAGNOSIS — Z8546 Personal history of malignant neoplasm of prostate: Secondary | ICD-10-CM | POA: Insufficient documentation

## 2018-05-23 DIAGNOSIS — Z7982 Long term (current) use of aspirin: Secondary | ICD-10-CM | POA: Diagnosis not present

## 2018-05-23 DIAGNOSIS — Z79899 Other long term (current) drug therapy: Secondary | ICD-10-CM | POA: Diagnosis not present

## 2018-05-23 DIAGNOSIS — G709 Myoneural disorder, unspecified: Secondary | ICD-10-CM | POA: Insufficient documentation

## 2018-05-23 DIAGNOSIS — E785 Hyperlipidemia, unspecified: Secondary | ICD-10-CM | POA: Insufficient documentation

## 2018-05-23 HISTORY — DX: Anemia, unspecified: D64.9

## 2018-05-23 HISTORY — PX: AV FISTULA PLACEMENT: SHX1204

## 2018-05-23 LAB — GLUCOSE, CAPILLARY
Glucose-Capillary: 93 mg/dL (ref 70–99)
Glucose-Capillary: 83 mg/dL (ref 70–99)
Glucose-Capillary: 84 mg/dL (ref 70–99)
Glucose-Capillary: 69 mg/dL — ABNORMAL LOW (ref 70–99)

## 2018-05-23 LAB — POCT I-STAT 4, (NA,K, GLUC, HGB,HCT)
Glucose, Bld: 84 mg/dL (ref 70–99)
HCT: 41 % (ref 39.0–52.0)
Hemoglobin: 13.9 g/dL (ref 13.0–17.0)
Potassium: 3.4 mmol/L — ABNORMAL LOW (ref 3.5–5.1)
Sodium: 140 mmol/L (ref 135–145)

## 2018-05-23 LAB — NOVEL CORONAVIRUS, NAA (HOSP ORDER, SEND-OUT TO REF LAB; TAT 18-24 HRS): SARS-CoV-2, NAA: NOT DETECTED

## 2018-05-23 LAB — SARS CORONAVIRUS 2 BY RT PCR (HOSPITAL ORDER, PERFORMED IN ~~LOC~~ HOSPITAL LAB): SARS Coronavirus 2: NEGATIVE

## 2018-05-23 SURGERY — ARTERIOVENOUS (AV) FISTULA CREATION
Anesthesia: Monitor Anesthesia Care | Site: Arm Lower | Laterality: Right

## 2018-05-23 MED ORDER — SODIUM CHLORIDE 0.9 % IV SOLN
INTRAVENOUS | Status: DC | PRN
  Administered 2018-05-23: 500 mL

## 2018-05-23 MED ORDER — SODIUM CHLORIDE 0.9 % IV SOLN
INTRAVENOUS | Status: DC
  Administered 2018-05-23 (×2): via INTRAVENOUS

## 2018-05-23 MED ORDER — ONDANSETRON HCL 4 MG/2ML IJ SOLN
INTRAMUSCULAR | Status: DC | PRN
  Administered 2018-05-23: 4 mg via INTRAVENOUS

## 2018-05-23 MED ORDER — MIDAZOLAM HCL 2 MG/2ML IJ SOLN
INTRAMUSCULAR | Status: AC
  Filled 2018-05-23: qty 2

## 2018-05-23 MED ORDER — FENTANYL CITRATE (PF) 250 MCG/5ML IJ SOLN
INTRAMUSCULAR | Status: AC
  Filled 2018-05-23: qty 5

## 2018-05-23 MED ORDER — PROPOFOL 500 MG/50ML IV EMUL
INTRAVENOUS | Status: DC | PRN
  Administered 2018-05-23: 50 ug/kg/min via INTRAVENOUS

## 2018-05-23 MED ORDER — MIDAZOLAM HCL 5 MG/5ML IJ SOLN
INTRAMUSCULAR | Status: DC | PRN
  Administered 2018-05-23 (×2): 1 mg via INTRAVENOUS

## 2018-05-23 MED ORDER — PROTAMINE SULFATE 10 MG/ML IV SOLN
INTRAVENOUS | Status: DC | PRN
  Administered 2018-05-23 (×2): 10 mg via INTRAVENOUS
  Administered 2018-05-23: 20 mg via INTRAVENOUS
  Administered 2018-05-23: 10 mg via INTRAVENOUS

## 2018-05-23 MED ORDER — PHENYLEPHRINE HCL (PRESSORS) 10 MG/ML IV SOLN
INTRAVENOUS | Status: DC | PRN
  Administered 2018-05-23: 80 ug via INTRAVENOUS

## 2018-05-23 MED ORDER — PAPAVERINE HCL 30 MG/ML IJ SOLN
INTRAMUSCULAR | Status: AC
  Filled 2018-05-23: qty 2

## 2018-05-23 MED ORDER — FENTANYL CITRATE (PF) 100 MCG/2ML IJ SOLN
INTRAMUSCULAR | Status: DC | PRN
  Administered 2018-05-23: 50 ug via INTRAVENOUS

## 2018-05-23 MED ORDER — LIDOCAINE-EPINEPHRINE (PF) 1 %-1:200000 IJ SOLN
INTRAMUSCULAR | Status: AC
  Filled 2018-05-23: qty 30

## 2018-05-23 MED ORDER — CEFAZOLIN SODIUM-DEXTROSE 2-4 GM/100ML-% IV SOLN
2.0000 g | INTRAVENOUS | Status: AC
  Administered 2018-05-23: 2 g via INTRAVENOUS
  Filled 2018-05-23: qty 100

## 2018-05-23 MED ORDER — HEPARIN SODIUM (PORCINE) 1000 UNIT/ML IJ SOLN
INTRAMUSCULAR | Status: DC | PRN
  Administered 2018-05-23: 9000 [IU] via INTRAVENOUS

## 2018-05-23 MED ORDER — LIDOCAINE HCL (CARDIAC) PF 100 MG/5ML IV SOSY
PREFILLED_SYRINGE | INTRAVENOUS | Status: DC | PRN
  Administered 2018-05-23: 20 mg via INTRATRACHEAL

## 2018-05-23 MED ORDER — SODIUM CHLORIDE 0.9 % IV SOLN
INTRAVENOUS | Status: AC
  Filled 2018-05-23: qty 1.2

## 2018-05-23 MED ORDER — PAPAVERINE HCL 30 MG/ML IJ SOLN
INTRAMUSCULAR | Status: DC | PRN
  Administered 2018-05-23: 60 mg via INTRAVENOUS

## 2018-05-23 MED ORDER — HYDROCODONE-ACETAMINOPHEN 5-325 MG PO TABS: 1.0000 | ORAL_TABLET | Freq: Four times a day (QID) | ORAL | 0 refills | Status: DC | PRN

## 2018-05-23 MED ORDER — ACETAMINOPHEN 500 MG PO TABS: 1000.0000 mg | ORAL_TABLET | Freq: Once | ORAL | Status: DC

## 2018-05-23 MED ORDER — 0.9 % SODIUM CHLORIDE (POUR BTL) OPTIME
TOPICAL | Status: DC | PRN
  Administered 2018-05-23: 1000 mL

## 2018-05-23 MED ORDER — LIDOCAINE 2% (20 MG/ML) 5 ML SYRINGE
INTRAMUSCULAR | Status: AC
  Filled 2018-05-23: qty 5

## 2018-05-23 MED ORDER — LIDOCAINE HCL (PF) 1 % IJ SOLN
INTRAMUSCULAR | Status: DC | PRN
  Administered 2018-05-23: 10 mL

## 2018-05-23 MED ORDER — LIDOCAINE HCL (PF) 1 % IJ SOLN
INTRAMUSCULAR | Status: AC
  Filled 2018-05-23: qty 30

## 2018-05-23 SURGICAL SUPPLY — 33 items
ARMBAND PINK RESTRICT EXTREMIT (MISCELLANEOUS) ×2 IMPLANT
CANISTER SUCT 3000ML PPV (MISCELLANEOUS) ×2 IMPLANT
CANNULA VESSEL 3MM 2 BLNT TIP (CANNULA) ×4 IMPLANT
CLIP VESOCCLUDE MED 6/CT (CLIP) ×2 IMPLANT
CLIP VESOCCLUDE SM WIDE 6/CT (CLIP) ×8 IMPLANT
COVER PROBE W GEL 5X96 (DRAPES) IMPLANT
COVER WAND RF STERILE (DRAPES) ×2 IMPLANT
DECANTER SPIKE VIAL GLASS SM (MISCELLANEOUS) ×2 IMPLANT
DERMABOND ADVANCED (GAUZE/BANDAGES/DRESSINGS) ×1
DERMABOND ADVANCED .7 DNX12 (GAUZE/BANDAGES/DRESSINGS) ×1 IMPLANT
ELECT REM PT RETURN 9FT ADLT (ELECTROSURGICAL) ×2
ELECTRODE REM PT RTRN 9FT ADLT (ELECTROSURGICAL) ×1 IMPLANT
GLOVE BIO SURGEON STRL SZ7.5 (GLOVE) ×4 IMPLANT
GLOVE BIOGEL PI IND STRL 6.5 (GLOVE) ×2 IMPLANT
GLOVE BIOGEL PI IND STRL 8 (GLOVE) ×2 IMPLANT
GLOVE BIOGEL PI INDICATOR 6.5 (GLOVE) ×2
GLOVE BIOGEL PI INDICATOR 8 (GLOVE) ×2
GOWN STRL REUS W/ TWL LRG LVL3 (GOWN DISPOSABLE) ×3 IMPLANT
GOWN STRL REUS W/TWL 2XL LVL3 (GOWN DISPOSABLE) ×2 IMPLANT
GOWN STRL REUS W/TWL LRG LVL3 (GOWN DISPOSABLE) ×3
KIT BASIN OR (CUSTOM PROCEDURE TRAY) ×2 IMPLANT
KIT TURNOVER KIT B (KITS) ×2 IMPLANT
NS IRRIG 1000ML POUR BTL (IV SOLUTION) ×2 IMPLANT
PACK CV ACCESS (CUSTOM PROCEDURE TRAY) ×2 IMPLANT
PAD ARMBOARD 7.5X6 YLW CONV (MISCELLANEOUS) ×4 IMPLANT
SPONGE SURGIFOAM ABS GEL 100 (HEMOSTASIS) IMPLANT
SUT PROLENE 6 0 BV (SUTURE) ×4 IMPLANT
SUT VIC AB 3-0 SH 27 (SUTURE) ×1
SUT VIC AB 3-0 SH 27X BRD (SUTURE) ×1 IMPLANT
SUT VICRYL 4-0 PS2 18IN ABS (SUTURE) ×4 IMPLANT
TOWEL GREEN STERILE (TOWEL DISPOSABLE) ×2 IMPLANT
UNDERPAD 30X30 (UNDERPADS AND DIAPERS) ×2 IMPLANT
WATER STERILE IRR 1000ML POUR (IV SOLUTION) ×2 IMPLANT

## 2018-05-23 NOTE — Anesthesia Procedure Notes (Signed)
Procedure Name: MAC Date/Time: 05/23/2018 1:15 PM Performed by: Lavell Luster, CRNA Pre-anesthesia Checklist: Patient identified, Emergency Drugs available, Suction available, Patient being monitored and Timeout performed Patient Re-evaluated:Patient Re-evaluated prior to induction Oxygen Delivery Method: Simple face mask and Nasal cannula Induction Type: IV induction Placement Confirmation: breath sounds checked- equal and bilateral and positive ETCO2 Dental Injury: Teeth and Oropharynx as per pre-operative assessment

## 2018-05-23 NOTE — Op Note (Signed)
    NAME: Thomas Mcguire    MRN: [id number] DOB: 06-11-1940    DATE OF OPERATION: 05/23/2018  PREOP DIAGNOSIS:    Stage IV chronic knee disease                    POSTOP DIAGNOSIS:    Same  PROCEDURE:    1.  Right radiocephalic AV fistula 2.  Ligation of competing branch  SURGEON: Judeth Cornfield. Scot Dock, MD, FACS  ASSIST: Arlee Muslim, PA  ANESTHESIA: Local with sedation  EBL: Minimal  INDICATIONS:    Thomas Mcguire is a 78 y.o. male who is not yet on dialysis.  He presents for access.  FINDINGS:   The forearm cephalic vein look reasonable in size.  The radial artery was small however.  The upper arm cephalic vein was smaller and therefore placed a radiocephalic fistula.  The alternative if this fails would be a basilic vein transposition.  TECHNIQUE:   The patient was taken to the operating room and was sedated by anesthesia.  The right upper extremity was prepped and draped in usual sterile fashion.  After the skin was anesthetized with 1% lidocaine a longitudinal incision was made over the cephalic vein at the wrist.  This was ligated distally and then irrigated up with heparinized saline.  A separate longitudinal incision was made over the radial artery.  This was quite small.  I irrigated with papaverine.  It did take a 1.5 mm dilator.  The vein was spatulated after the patient was heparinized.  The radial artery was clamped proximally distally and a longitudinal arteriotomy was made.  The vein was sewn end-to-side to the artery using continuous 6-0 Prolene suture.  At the completion was a good thrill in the fistula.  There was 1 very large competing branch in the mid forearm and I made a separate longitudinal incision over this area and this was ligated with a 3-0 silk tie and divided.  Each of the wounds was closed with a deep layer of 3-0 Vicryl and the skin closed with 4-0 Vicryl.  Dermabond was applied.  The patient tolerated the procedure well and was transferred to  the recovery room in stable condition.  All needle and sponge counts were correct.  Deitra Mayo, MD, FACS [facility]  DATE OF DICTATION:   05/23/2018

## 2018-05-23 NOTE — H&P (Signed)
REASON FOR CONSULT:    Needs hemodialysis access ASAP.  The consult is requested by Dr. Carolin Sicks.  ASSESSMENT & PLAN:   STAGE IV CHRONIC KIDNEY DISEASE: This patient presents for new hemodialysis access.  We are asked to only place a fistula not place a graft if the fistula was not possible.  Based on his vein map, I think his best chance for a fistula would be a right radiocephalic fistula or right brachiocephalic fistula.  I have discussed the indications for placement of a fistula.  We have also discussed the potential complications including, but not limited to, failure of the fistula to mature, the need for interventions, or the need to place new access if this does not work.  We also discussed steal symptoms, arm swelling, and wound healing problems.  He is not yet on dialysis.  His surgery has been scheduled for 04/03/2018.  Thomas Mayo, MD, FACS Beeper [phone] Office: [phone]   HPI:   Thomas Mcguire is a pleasant 78 y.o. male, who is referred for evaluation for hemodialysis access.  I reviewed the notes from the referring office.  The patient was seen on 01/25/2018.  He has chronic kidney disease secondary to hypertension and diabetes.  He has a history of prostate cancer that is now in remission.  His creatinine has been gradually worsening.  At the time of this visit he was noted to have stage IV chronic kidney disease.  His GFR at that time was 12.  He also has secondary hyperparathyroidism.  Of note, we were asked to place an AV fistula.  If an AV fistula was not possible we were to wait before placing an AV graft.  On my history, the patient denies any uremic symptoms.  Specifically, he denies nausea, vomiting, fatigue, anorexia, or palpitations.  He is not on any blood thinners except for aspirin.  Past Medical History:  Diagnosis Date  . Arthritis   . Chronic renal insufficiency    CHECKED Q3MOS PER PT. NO TREATMENT  . Diabetes mellitus   . Gout     Has not had recently- 08/24/11  . Hyperlipidemia   . Hypertension   . Neuromuscular disorder ([place])    Neuropathy         Family History  Problem Relation Age of Onset  . Colon cancer Neg Hx     SOCIAL HISTORY: Social History        Socioeconomic History  . Marital status: Married    Spouse name: Thomas Mcguire  . Number of children: Not on file  . Years of education: Not on file  . Highest education level: Not on file  Occupational History  . Not on file  Social Needs  . Financial resource strain: Not on file  . Food insecurity:    Worry: Not on file    Inability: Not on file  . Transportation needs:    Medical: Not on file    Non-medical: Not on file  Tobacco Use  . Smoking status: Former Smoker    Last attempt to quit: 03/11/1996    Years since quitting: 22.0  . Smokeless tobacco: Never Used  Substance and Sexual Activity  . Alcohol use: No  . Drug use: No  . Sexual activity: Not Currently  Lifestyle  . Physical activity:    Days per week: Not on file    Minutes per session: Not on file  . Stress: Not on file  Relationships  . Social connections:    Talks on  phone: Not on file    Gets together: Not on file    Attends religious service: Not on file    Active member of club or organization: Not on file    Attends meetings of clubs or organizations: Not on file    Relationship status: Not on file  . Intimate partner violence:    Fear of current or ex partner: Not on file    Emotionally abused: Not on file    Physically abused: Not on file    Forced sexual activity: Not on file  Other Topics Concern  . Not on file  Social History Narrative  . Not on file    No Known Allergies        Current Outpatient Medications  Medication Sig Dispense Refill  . allopurinol (ZYLOPRIM) 300 MG tablet Take 300 mg by mouth daily.      Marland Kitchen amLODipine (NORVASC) 5 MG tablet Take 5 mg by mouth 2 (two) times daily.  4  .  aspirin 81 MG tablet Take 81 mg by mouth daily.    . calcitRIOL (ROCALTROL) 0.25 MCG capsule Take 1 capsule by mouth daily.  5  . carvedilol (COREG) 25 MG tablet Take 12.5 mg by mouth 2 (two) times daily with a meal.     . COD LIVER OIL PO Take 1 capsule by mouth daily.     . cyanocobalamin (,VITAMIN B-12,) 1000 MCG/ML injection Vitamin B12 injection: 1000 mcg (1 mL) by subcu injection once daily for 7 days; then once weekly for 4 weeks; then once monthly thereafter.  7 07/15/2010 12 mL 1  . doxazosin (CARDURA) 2 MG tablet Take 1 mg by mouth at bedtime.     . ferrous sulfate 325 (65 FE) MG tablet Take 325 mg by mouth 2 (two) times daily with a meal.    . folic acid (FOLVITE) 1 MG tablet TAKE 1 TABLET BY MOUTH EVERY DAY 90 tablet 0  . furosemide (LASIX) 40 MG tablet Take 80 mg by mouth 2 (two) times daily.     Marland Kitchen guanFACINE (TENEX) 2 MG tablet Take 2 mg by mouth every morning.     . hydrochlorothiazide (HYDRODIURIL) 25 MG tablet Take 25 mg by mouth daily.  3  . HYDROcodone-acetaminophen (VICODIN) 5-500 MG per tablet Take 1-2 tablets by mouth daily as needed. For pain    . insulin NPH (HUMULIN N,NOVOLIN N) 100 UNIT/ML injection Inject 50 Units into the skin 2 (two) times daily. Inject 50 units in am and 50 units in pm    . insulin regular (HUMULIN R,NOVOLIN R) 100 units/mL injection Inject 22 Units into the skin 3 (three) times daily before meals.     . isosorbide mononitrate (IMDUR) 30 MG 24 hr tablet Take 30 mg by mouth daily.  6  . potassium chloride (K-DUR) 10 MEQ tablet Take 10 mEq by mouth daily.  3  . pravastatin (PRAVACHOL) 40 MG tablet Take 40 mg by mouth daily.      . sodium bicarbonate 650 MG tablet Take 650 mg by mouth 2 (two) times daily.  5   No current facility-administered medications for this visit.     REVIEW OF SYSTEMS:  [X]  denotes positive finding, [ ]  denotes negative finding Cardiac  Comments:  Chest pain or chest pressure:    Shortness of  breath upon exertion:    Short of breath when lying flat:    Irregular heart rhythm:        Vascular  Pain in calf, thigh, or hip brought on by ambulation:    Pain in feet at night that wakes you up from your sleep:     Blood clot in your veins:    Leg swelling:         Pulmonary    Oxygen at home:    Productive cough:     Wheezing:         Neurologic    Sudden weakness in arms or legs:     Sudden numbness in arms or legs:     Sudden onset of difficulty speaking or slurred speech:    Temporary loss of vision in one eye:     Problems with dizziness:         Gastrointestinal    Blood in stool:     Vomited blood:         Genitourinary    Burning when urinating:     Blood in urine:        Psychiatric    Major depression:         Hematologic    Bleeding problems:    Problems with blood clotting too easily:        Skin    Rashes or ulcers:        Constitutional    Fever or chills:     PHYSICAL EXAM:      Vitals:   03/08/18 1527  BP: 133/68  Pulse: 62  Resp: 18  SpO2: 98%  Weight: 217 lb 14.8 oz (98.9 kg)  Height: 6\' 6"  (1.981 m)   GENERAL: The patient is a well-nourished male, in no acute distress. The vital signs are documented above. CARDIAC: There is a regular rate and rhythm.  VASCULAR: I do not detect carotid bruits. He has palpable brachial and radial pulses bilaterally. On exam, he does appear to have a reasonable forearm cephalic vein on the right and upper arm cephalic vein on the right. PULMONARY: There is good air exchange bilaterally without wheezing or rales. ABDOMEN: Soft and non-tender with normal pitched bowel sounds.  MUSCULOSKELETAL: There are no major deformities or cyanosis. NEUROLOGIC: No focal weakness or paresthesias are detected. SKIN: There are no ulcers or rashes noted. PSYCHIATRIC: The patient has a normal affect.  DATA:     ARTERIAL DUPLEX: I have independently interpreted his arterial duplex scan today.  On the left side there is a triphasic radial and ulnar waveform.  The brachial artery measures 0.48 cm in diameter.  On the right side there is a triphasic radial and ulnar waveform.  The brachial artery measures 0.45 cm in diameter.  VEIN MAP: I have independently interpreted his vein map today.  On the left side, the forearm cephalic vein is quite small.  The upper arm cephalic vein is also small.  The basilic vein on the left looks reasonable in size.  On the right side the forearm cephalic vein looks reasonable in size.  The upper arm cephalic vein is only small at the shoulder.  The basilic vein on the right looks reasonable in size.

## 2018-05-23 NOTE — Anesthesia Preprocedure Evaluation (Addendum)
Anesthesia Evaluation  Patient identified by MRN, date of birth, ID band Patient awake    Reviewed: Allergy & Precautions, H&P , NPO status , Patient's Chart, lab work & pertinent test results, reviewed documented beta blocker date and time   Airway Mallampati: II  TM Distance: >3 FB Neck ROM: Full    Dental no notable dental hx. (+) Teeth Intact, Dental Advisory Given   Pulmonary neg pulmonary ROS, former smoker,    Pulmonary exam normal breath sounds clear to auscultation       Cardiovascular hypertension, Pt. on medications and Pt. on home beta blockers  Rhythm:Regular Rate:Normal     Neuro/Psych negative neurological ROS  negative psych ROS   GI/Hepatic negative GI ROS, Neg liver ROS,   Endo/Other  diabetes, Type 1, Insulin Dependent  Renal/GU Renal InsufficiencyRenal disease  negative genitourinary   Musculoskeletal  (+) Arthritis , Osteoarthritis,    Abdominal   Peds  Hematology  (+) Blood dyscrasia, anemia ,   Anesthesia Other Findings   Reproductive/Obstetrics negative OB ROS                            Anesthesia Physical Anesthesia Plan  ASA: III  Anesthesia Plan: MAC   Post-op Pain Management:    Induction: Intravenous  PONV Risk Score and Plan: 2 and Propofol infusion and Ondansetron  Airway Management Planned: Simple Face Mask  Additional Equipment:   Intra-op Plan:   Post-operative Plan:   Informed Consent: I have reviewed the patients History and Physical, chart, labs and discussed the procedure including the risks, benefits and alternatives for the proposed anesthesia with the patient or authorized representative who has indicated his/her understanding and acceptance.     Dental advisory given  Plan Discussed with: CRNA  Anesthesia Plan Comments:         Anesthesia Quick Evaluation

## 2018-05-23 NOTE — Transfer of Care (Signed)
Immediate Anesthesia Transfer of Care Note  Patient: Thomas Mcguire  Procedure(s) Performed: RADIAL- CEPHALIC ARTERIOVENOUS (AV) FISTULA CREATION RIGHT ARM AND LIGATION OF COMPETING BRANCH. (Right Arm Lower)  Patient Location: PACU  Anesthesia Type:MAC  Level of Consciousness: awake, alert , oriented and patient cooperative  Airway & Oxygen Therapy: Patient Spontanous Breathing and Patient connected to nasal cannula oxygen  Post-op Assessment: Report given to RN and Post -op Vital signs reviewed and stable  Post vital signs: Reviewed and stable  Last Vitals:  Vitals Value Taken Time  BP 137/50 05/23/2018  2:55 PM  Temp    Pulse 64 05/23/2018  2:56 PM  Resp 15 05/23/2018  2:56 PM  SpO2 100 % 05/23/2018  2:56 PM  Vitals shown include unvalidated device data.  Last Pain:  Vitals:   05/23/18 0822  TempSrc:   PainSc: 0-No pain         Complications: No apparent anesthesia complications

## 2018-05-23 NOTE — Anesthesia Postprocedure Evaluation (Signed)
Anesthesia Post Note  Patient: DIQUAN KASSIS  Procedure(s) Performed: RADIAL- CEPHALIC ARTERIOVENOUS (AV) FISTULA CREATION RIGHT ARM AND LIGATION OF COMPETING BRANCH. (Right Arm Lower)     Patient location during evaluation: PACU Anesthesia Type: MAC Level of consciousness: awake and alert Pain management: pain level controlled Vital Signs Assessment: post-procedure vital signs reviewed and stable Respiratory status: spontaneous breathing, nonlabored ventilation and respiratory function stable Cardiovascular status: stable and blood pressure returned to baseline Postop Assessment: no apparent nausea or vomiting Anesthetic complications: no    Last Vitals:  Vitals:   05/23/18 1510 05/23/18 1527  BP: (!) 147/62 135/73  Pulse: 64 64  Resp: 12 16  Temp:  (!) 36.3 C  SpO2: 96% 98%    Last Pain:  Vitals:   05/23/18 1455  TempSrc:   PainSc: 3                  Kelle Ruppert,W. EDMOND

## 2018-05-23 NOTE — Discharge Instructions (Signed)
° °  [facility] ° °Discharge Instructions ° °AV Fistula or Graft Surgery for Dialysis Access ° °Please refer to the following instructions for your post-procedure care. Your surgeon or physician assistant will discuss any changes with you. ° °Activity ° °You may drive the day following your surgery, if you are comfortable and no longer taking prescription pain medication. Resume full activity as the soreness in your incision resolves. ° °Bathing/Showering ° °You may shower after you go home. Keep your incision dry for 48 hours. Do not soak in a bathtub, hot tub, or swim until the incision heals completely. You may not shower if you have a hemodialysis catheter. ° °Incision Care ° °Clean your incision with mild soap and water after 48 hours. Pat the area dry with a clean towel. You do not need a bandage unless otherwise instructed. Do not apply any ointments or creams to your incision. You may have skin glue on your incision. Do not peel it off. It will come off on its own in about one week. Your arm may swell a bit after surgery. To reduce swelling use pillows to elevate your arm so it is above your heart. Your doctor will tell you if you need to lightly wrap your arm with an ACE bandage. ° °Diet ° °Resume your normal diet. There are not special food restrictions following this procedure. In order to heal from your surgery, it is CRITICAL to get adequate nutrition. Your body requires vitamins, minerals, and protein. Vegetables are the best source of vitamins and minerals. Vegetables also provide the perfect balance of protein. Processed food has little nutritional value, so try to avoid this. ° °Medications ° °Resume taking all of your medications. If your incision is causing pain, you may take over-the counter pain relievers such as acetaminophen (Tylenol). If you were prescribed a stronger pain medication, please be aware these medications can cause nausea and constipation. Prevent  nausea by taking the medication with a snack or meal. Avoid constipation by drinking plenty of fluids and eating foods with high amount of fiber, such as fruits, vegetables, and grains. Do not take Tylenol if you are taking prescription pain medications. ° ° ° ° °Follow up °Your surgeon may want to see you in the office following your access surgery. If so, this will be arranged at the time of your surgery. ° °Please call us immediately for any of the following conditions: ° °Increased pain, redness, drainage (pus) from your incision site °Fever of 101 degrees or higher °Severe or worsening pain at your incision site °Hand pain or numbness. ° °Reduce your risk of vascular disease: ° °Stop smoking. If you would like help, call [facility] at 1-800-QUIT-NOW (1-800-784-8669) or [place] at 336-586-4000 ° °Manage your cholesterol °Maintain a desired weight °Control your diabetes °Keep your blood pressure down ° °Dialysis ° °It will take several weeks to several months for your new dialysis access to be ready for use. Your surgeon will determine when it is OK to use it. Your nephrologist will continue to direct your dialysis. You can continue to use your Permcath until your new access is ready for use. ° °If you have any questions, please call the office at [phone]. ° °

## 2018-05-24 ENCOUNTER — Encounter (HOSPITAL_COMMUNITY): Payer: Self-pay | Admitting: Vascular Surgery

## 2018-05-26 ENCOUNTER — Telehealth: Payer: Self-pay | Admitting: Vascular Surgery

## 2018-05-26 NOTE — Telephone Encounter (Signed)
sch appt [facility]tr 07/05/2018 9am Dialysis Duplex 940am p/o MD

## 2018-05-26 NOTE — Telephone Encounter (Signed)
-----   Message from Dagoberto Ligas, PA-C sent at 05/23/2018  2:43 PM EDT -----  Can you schedule a fistula duplex in 6 weeks to see Dr. Scot Dock.  PO R radiocephalic fistula. Thanks, [facility]

## 2018-06-06 ENCOUNTER — Other Ambulatory Visit: Payer: Self-pay | Admitting: Hematology and Oncology

## 2018-06-06 DIAGNOSIS — C9110 Chronic lymphocytic leukemia of B-cell type not having achieved remission: Secondary | ICD-10-CM

## 2018-06-06 DIAGNOSIS — D649 Anemia, unspecified: Secondary | ICD-10-CM

## 2018-06-06 DIAGNOSIS — E538 Deficiency of other specified B group vitamins: Secondary | ICD-10-CM

## 2018-06-09 ENCOUNTER — Other Ambulatory Visit: Payer: Self-pay | Admitting: *Deleted

## 2018-06-09 DIAGNOSIS — E538 Deficiency of other specified B group vitamins: Secondary | ICD-10-CM

## 2018-06-09 DIAGNOSIS — D649 Anemia, unspecified: Secondary | ICD-10-CM

## 2018-06-09 DIAGNOSIS — C9110 Chronic lymphocytic leukemia of B-cell type not having achieved remission: Secondary | ICD-10-CM

## 2018-06-09 MED ORDER — FOLIC ACID 1 MG PO TABS: 1.0000 mg | ORAL_TABLET | Freq: Every day | ORAL | 0 refills | Status: DC

## 2018-06-19 ENCOUNTER — Encounter: Payer: Medicare Other | Attending: Physician Assistant | Admitting: Physician Assistant

## 2018-06-19 ENCOUNTER — Other Ambulatory Visit: Payer: Self-pay

## 2018-06-19 DIAGNOSIS — N183 Chronic kidney disease, stage 3 (moderate): Secondary | ICD-10-CM | POA: Insufficient documentation

## 2018-06-19 DIAGNOSIS — L97512 Non-pressure chronic ulcer of other part of right foot with fat layer exposed: Secondary | ICD-10-CM | POA: Diagnosis present

## 2018-06-19 DIAGNOSIS — E11621 Type 2 diabetes mellitus with foot ulcer: Secondary | ICD-10-CM | POA: Diagnosis not present

## 2018-06-19 DIAGNOSIS — Z87891 Personal history of nicotine dependence: Secondary | ICD-10-CM | POA: Insufficient documentation

## 2018-06-19 DIAGNOSIS — E1122 Type 2 diabetes mellitus with diabetic chronic kidney disease: Secondary | ICD-10-CM | POA: Diagnosis not present

## 2018-06-19 DIAGNOSIS — E1142 Type 2 diabetes mellitus with diabetic polyneuropathy: Secondary | ICD-10-CM | POA: Insufficient documentation

## 2018-06-19 DIAGNOSIS — Z794 Long term (current) use of insulin: Secondary | ICD-10-CM | POA: Insufficient documentation

## 2018-06-19 DIAGNOSIS — I129 Hypertensive chronic kidney disease with stage 1 through stage 4 chronic kidney disease, or unspecified chronic kidney disease: Secondary | ICD-10-CM | POA: Insufficient documentation

## 2018-06-19 DIAGNOSIS — E7800 Pure hypercholesterolemia, unspecified: Secondary | ICD-10-CM | POA: Insufficient documentation

## 2018-06-19 DIAGNOSIS — I872 Venous insufficiency (chronic) (peripheral): Secondary | ICD-10-CM | POA: Diagnosis not present

## 2018-06-19 NOTE — Progress Notes (Signed)
IZAAK, SAHR ([id number]) Visit Report for 06/19/2018 Abuse/Suicide Risk Screen Details Patient Name: Thomas Mcguire, Thomas Mcguire. Date of Service: 06/19/2018 2:45 PM Medical Record Number: [id number] Patient Account Number: [id number] Date of Birth/Sex: [birth year] (78 y.o. M) Treating RN: Army Melia Primary Care Drue Harr: Leanna Battles Other Clinician: Referring Merrell Rettinger: Referral, Self Treating Naftali Carchi/Extender: STONE III, HOYT Weeks in Treatment: 0 Abuse/Suicide Risk Screen Items Answer ABUSE RISK SCREEN: Has anyone close to you tried to hurt or harm you recentlyo No Do you feel uncomfortable with anyone in your familyo No Has anyone forced you do things that you didnot want to doo No Electronic Signature(s) Signed: 06/19/2018 3:30:44 PM By: Army Melia Entered By: Army Melia on 06/19/2018 15:02:02 Thomas Mcguire ([id number]) -------------------------------------------------------------------------------- Activities of Daily Living Details Patient Name: Thomas Mcguire, Thomas Mcguire. Date of Service: 06/19/2018 2:45 PM Medical Record Number: [id number] Patient Account Number: [id number] Date of Birth/Sex: 09/07/1940 (78 y.o. M) Treating RN: Army Melia Primary Care Devarion Mcclanahan: Leanna Battles Other Clinician: Referring Giavonni Fonder: Referral, Self Treating Stephanie Littman/Extender: STONE III, HOYT Weeks in Treatment: 0 Activities of Daily Living Items Answer Activities of Daily Living (Please select one for each item) Drive Automobile Completely Able Take Medications Completely Able Use Telephone Completely Able Care for Appearance Completely Able Use Toilet Completely Able Bath / Shower Completely Able Dress Self Completely Able Feed Self Completely Able Walk Completely Able Get In / Out Bed Completely Able Housework Completely Able Prepare Meals Completely Able Handle Money Completely Able Shop for Self Completely Able Electronic Signature(s) Signed: 06/19/2018 3:30:44 PM By: Army Melia Entered By: Army Melia on 06/19/2018 15:02:14 Thomas Mcguire ([id number]) -------------------------------------------------------------------------------- Education Screening Details Patient Name: Thomas Mcguire, Thomas Mcguire. Date of Service: 06/19/2018 2:45 PM Medical Record Number: [id number] Patient Account Number: [id number] Date of Birth/Sex: April 25, 1940 (78 y.o. M) Treating RN: Army Melia Primary Care Wrenley Sayed: Leanna Battles Other Clinician: Referring Jearlean Demauro: Referral, Self Treating Chenelle Benning/Extender: Melburn Hake, HOYT Weeks in Treatment: 0 Primary Learner Assessed: Patient Learning Preferences/Education Level/Primary Language Learning Preference: Explanation, Demonstration Highest Education Level: College or Above Preferred Language: English Cognitive Barrier Language Barrier: No Translator Needed: No Memory Deficit: No Emotional Barrier: No Cultural/Religious Beliefs Affecting Medical Care: No Physical Barrier Impaired Vision: No Impaired Hearing: No Decreased Hand dexterity: No Knowledge/Comprehension Knowledge Level: High Comprehension Level: High Ability to understand written High instructions: Ability to understand verbal High instructions: Motivation Anxiety Level: Calm Cooperation: Cooperative Education Importance: Acknowledges Need Interest in Health Problems: Asks Questions Perception: Coherent Willingness to Engage in Self- High Management Activities: Readiness to Engage in Self- High Management Activities: Electronic Signature(s) Signed: 06/19/2018 3:30:44 PM By: Army Melia Entered By: Army Melia on 06/19/2018 15:02:39 Thomas Mcguire, Thomas Mcguire ([id number]) -------------------------------------------------------------------------------- Fall Risk Assessment Details Patient Name: Thomas Mcguire. Date of Service: 06/19/2018 2:45 PM Medical Record Number: [id number] Patient Account Number: [id number] Date of Birth/Sex: [birth year] (78 y.o.  M) Treating RN: Army Melia Primary Care Westin Knotts: Leanna Battles Other Clinician: Referring Brinley Treanor: Referral, Self Treating Emilyrose Darrah/Extender: STONE III, HOYT Weeks in Treatment: 0 Fall Risk Assessment Items Have you had 2 or more falls in the last 12 monthso 0 No Have you had any fall that resulted in injury in the last 12 monthso 0 No FALLS RISK SCREEN History of falling - immediate or within 3 months 0 No Secondary diagnosis (Do you have 2 or more medical diagnoseso) 0 No Ambulatory aid None/bed rest/wheelchair/nurse 0 No Crutches/cane/walker 0 No Furniture 0 No Intravenous therapy Access/Saline/Heparin Lock 0 No Gait/Transferring Normal/ bed rest/ wheelchair 0 No  Weak (short steps with or without shuffle, stooped but able to lift head while 0 No walking, may seek support from furniture) Impaired (short steps with shuffle, may have difficulty arising from chair, head 0 No down, impaired balance) Mental Status Oriented to own ability 0 No Electronic Signature(s) Signed: 06/19/2018 3:30:44 PM By: Army Melia Entered By: Army Melia on 06/19/2018 15:02:50 Thomas Mcguire ([id number]) -------------------------------------------------------------------------------- Foot Assessment Details Patient Name: Thomas Mcguire, Thomas Mcguire. Date of Service: 06/19/2018 2:45 PM Medical Record Number: [id number] Patient Account Number: [id number] Date of Birth/Sex: [birth year] (78 y.o. 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 Site Locations + = Sensation present, - = Sensation absent, C = Callus, U = Ulcer R = Redness, W = Warmth, M = Maceration, PU = Pre-ulcerative lesion F = Fissure, S = Swelling, D = Dryness Assessment Right: Left: Other Deformity: No Prior  Foot Ulcer: No Prior Amputation: No Charcot Joint: No Ambulatory Status: Ambulatory Without Help Gait: Steady Electronic Signature(s) Signed: 06/19/2018 3:30:44 PM By: Army Melia Entered By: Army Melia on 06/19/2018 15:06:23 Thomas Mcguire ([id number]) -------------------------------------------------------------------------------- Nutrition Risk Screening Details Patient Name: Thomas Mcguire, Thomas Mcguire. Date of Service: 06/19/2018 2:45 PM Medical Record Number: [id number] Patient Account Number: [id number] Date of Birth/Sex: 06/06/1940 (78 y.o. M) Treating RN: Army Melia Primary Care Vladislav Axelson: Leanna Battles Other Clinician: Referring Gilda Abboud: Referral, Self Treating Dacey Milberger/Extender: STONE III, HOYT Weeks in Treatment: 0 Height (in): 77 Weight (lbs): 205 Body Mass Index (BMI): 24.3 Nutrition Risk Screening Items Score Screening NUTRITION RISK SCREEN: I have an illness or condition that made me change the kind and/or amount of 0 No food I eat I eat fewer than two meals per day 0 No I eat few fruits and vegetables, or milk products 0 No I have three or more drinks of beer, liquor or wine almost every day 0 No I have tooth or mouth problems that make it hard for me to eat 0 No I don't always have enough money to buy the food I need 0 No I eat alone most of the time 0 No I take three or more different prescribed or over-the-counter drugs a day 0 No Without wanting to, I have lost or gained 10 pounds in the last six months 0 No I am not always physically able to shop, cook and/or feed myself 0 No Nutrition Protocols Good Risk Protocol 0 No interventions needed Moderate Risk Protocol High Risk Proctocol Risk Level: Good Risk Score: 0 Electronic Signature(s) Signed: 06/19/2018 3:30:44 PM By: Army Melia Entered By: Army Melia on 06/19/2018 15:02:58

## 2018-06-19 NOTE — Progress Notes (Signed)
EDINSON, DOMEIER ([id number]) Visit Report for 06/19/2018 Chief Complaint Document Details Patient Name: Thomas Mcguire, Thomas Mcguire. Date of Service: 06/19/2018 2:45 PM Medical Record Number: [id number] Patient Account Number: [id number] Date of Birth/Sex: [birth year] (78 y.o. M) Treating RN: Montey Hora Primary Care Provider: Leanna Battles Other Clinician: Referring Provider: Referral, Self Treating Provider/Extender: Melburn Hake, HOYT Weeks in Treatment: 0 Information Obtained from: Patient Chief Complaint Right great toe ulcer Electronic Signature(s) Signed: 06/19/2018 4:26:55 PM By: Worthy Keeler PA-C Entered By: Worthy Keeler on 06/19/2018 15:21:41 [name] ([id number]) -------------------------------------------------------------------------------- Debridement Details Patient Name: Thomas Mcguire, Thomas Mcguire. Date of Service: 06/19/2018 2:45 PM Medical Record Number: [id number] Patient Account Number: [id number] Date of Birth/Sex: Mar 02, 1940 (78 y.o. M) Treating RN: Montey Hora Primary Care Provider: Leanna Battles Other Clinician: Referring Provider: Referral, Self Treating Provider/Extender: STONE III, HOYT Weeks in Treatment: 0 Debridement Performed for Wound #3 Right,Lateral Toe Great Assessment: Performed By: Physician STONE III, HOYT E., PA-C Debridement Type: Debridement Severity of Tissue Pre Fat layer exposed Debridement: Level of Consciousness (Pre- Awake and Alert procedure): Pre-procedure Verification/Time Yes - 15:27 Out Taken: Start Time: 15:27 Pain Control: Lidocaine 4% Topical Solution Total Area Debrided (L x W): 1.5 (cm) x 3 (cm) = 4.5 (cm) Tissue and other material Viable, Non-Viable, Callus, Slough, Subcutaneous, Slough debrided: Level: Skin/Subcutaneous Tissue Debridement Description: Excisional Instrument: Curette Bleeding: Minimum Hemostasis Achieved: Pressure End Time: 15:32 Procedural Pain: 0 Post Procedural Pain: 0 Response to  Treatment: Procedure was tolerated well Level of Consciousness Awake and Alert (Post-procedure): Post Debridement Measurements of Total Wound Length: (cm) 1.5 Width: (cm) 3 Depth: (cm) 0.2 Volume: (cm) 0.707 Character of Wound/Ulcer Post Debridement: Improved Severity of Tissue Post Debridement: Fat layer exposed Post Procedure Diagnosis Same as Pre-procedure Electronic Signature(s) Signed: 06/19/2018 4:19:49 PM By: Montey Hora Signed: 06/19/2018 4:26:55 PM By: Worthy Keeler PA-C Entered By: Montey Hora on 06/19/2018 15:34:38 [name] ([id number]) -------------------------------------------------------------------------------- HPI Details Patient Name: Thomas Mcguire, Thomas Mcguire. Date of Service: 06/19/2018 2:45 PM Medical Record Number: [id number] Patient Account Number: [id number] Date of Birth/Sex: Jan 08, 1941 (78 y.o. 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 He is here for review of wound on the right lower leg. 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 He has chronic edema in his lower legs and uses 20-30 mm compression stockings which she is compliant with. He is a nonsmoker. Beside his diabetes he has hypertension and hypercholesterolemia and in 2013 he had stage III chronic renal failure. He tells me his primary physician Dr. Valetta Fuller at [facility] follows him every 3 months for his kidney failure. He also states he saw a nephrologist once. ABIs in this clinic were noncompressible. He tells me he saw Dr. Doren Custard in 6 or 7 years ago at [facility] in [place]. He does not remember what the issue  was. It would appear that the patient has had recurrent blistering in the lower extremities as there are several healed areas on the left anterior and right anterior calves 12/14/16 on evaluation today patient appears to be doing very well with the current three layer compression wraps. His right lower extremity is significantly improved as far as the swelling is  concerned. He has been tolerating the wraps without any complication. The wounds also appear to be doing better compared to last week's evaluation. Unfortunately he does have a new dramatic wound to the left interior shin but otherwise things are looking up. No fevers chills noted. Patient does have a appointment with [facility] on December 23, 2016 four arterial studies. 12/22/16 area x2 on right leg are closed. Still one open area on left leg. arterial studies tomorrow 12/28/16; 2 wounds on the right leg are closed. He still has one open area on the left leg. ARTERIAL studies were done on 12/23/16 D showed an ABI on the right of 0.99 and on the left at 0.68. His toe brachial index was 0.26 on the right. Monophasic waveforms at both the posterior tibial and dorsalis pedis arteries. The patient saw Dr. Trula Slade of vascular surgery yesterday. He stated he would follow him for the remaining wound on the left leg. He has been using silver alginate 01/12/17; the area on the left leg is closed. His original wounds on the right leg are closed as well. He has 20-30 mm compression stockings. He is following with Dr. Lita Mains ham for his arterial issues however his wounds are closed and he is tolerating 20-30 mm stockings Readmission: 06/19/18 patient presents today for readmission into our clinic concerning issues having with his right great toe which unfortunately started as a blister just the past week. He notes is not having any pain on that he does have neuropathy this is likely the big reason that he is not experiencing any  discomfort in particular. Fortunately there's no signs of active infection at this time. No fevers, chills, nausea, or vomiting noted at this time. Patient's wound bed currently showed signs of necrotic tissue on the surface of the planned where he does have some blistering skin at the site where he has a callous. Fortunately there's no signs of systemic infection although he does have a red streak that is extending up from the great toe I'm unsure if this is related directly to an active infection at this time with this is something that should normally there the patient is unsure. He has previously undergone vascular testing but I'm unsure as to whether or not this has been repeated in since we last saw him. Fortunately he does still see [facility] in [place] and it appears they may have an arterial study ordered for him will be looking epic for later this month although we are gonna call and confirm this to be certain. I think that is something that we really do need to do. Electronic Signature(s) Signed: 06/19/2018 4:26:55 PM By: Worthy Keeler PA-C Entered By: Worthy Keeler on 06/19/2018 16:17:24 Thomas Mcguire, Thomas Mcguire ([id number]) Thomas Mcguire, Thomas Mcguire ([id number]) -------------------------------------------------------------------------------- Physical Exam Details Patient Name: Thomas Mcguire, Thomas Mcguire. Date of Service: 06/19/2018 2:45 PM Medical Record Number: [id number] Patient Account Number: [id number] Date of Birth/Sex: Oct 10, 1940 (78 y.o. M) Treating RN: Montey Hora Primary Care Provider: Leanna Battles Other Clinician: Referring Provider: Referral, Self Treating Provider/Extender: STONE III, HOYT Weeks in Treatment: 0 Constitutional patient is hypertensive.. pulse regular and within target range for patient.Marland Kitchen respirations regular, non-labored and within target range for patient.Marland Kitchen temperature within target range for patient.. Well-nourished and well-hydrated in no  acute distress. Eyes conjunctiva clear no eyelid edema noted. pupils equal round and reactive to light and accommodation. Ears, Nose, Mouth, and Throat no gross abnormality of ear auricles or external auditory canals. normal  hearing noted during conversation. mucus membranes moist. Respiratory normal breathing without difficulty. clear to auscultation bilaterally. Cardiovascular regular rate and rhythm with normal S1, S2. Absent posterior tibial and dorsalis pedis pulses bilateral lower extremities. no clubbing, cyanosis, significant edema, <3 sec cap refill. Gastrointestinal (GI) soft, non-tender, non-distended, +BS. no ventral hernia noted. Musculoskeletal normal gait and posture. no significant deformity or arthritic changes, no loss or range of motion, no clubbing. Psychiatric this patient is able to make decisions and demonstrates good insight into disease process. Alert and Oriented x 3. pleasant and cooperative. Notes During the evaluation today I did perform as light as possible of the surgical debridement in order to clear away some of the necrotic tissue which was mainly blistered tissue although there was some callous and subcutaneous tissue that had to be removed in order to clean this away to try to help the wound to heal. I did not attempt to be too aggressive there may still be some additional necrotic tissue that needs to be removed but again I would not want to be that aggressive the last week confirm that he has sufficient blood flow to support an aggressive debridement. Patient tolerated this today without complication post debridement wound bed appears to be doing rather well. He did not have any pain. Electronic Signature(s) Signed: 06/19/2018 4:26:55 PM By: Worthy Keeler PA-C Entered By: Worthy Keeler on 06/19/2018 16:18:27 [name] ([id number]) -------------------------------------------------------------------------------- Physician Orders  Details Patient Name: Thomas Mcguire, Thomas Mcguire. Date of Service: 06/19/2018 2:45 PM Medical Record Number: [id number] Patient Account Number: [id number] Date of Birth/Sex: 1940/04/02 (78 y.o. M) Treating RN: Montey Hora Primary Care Provider: Leanna Battles Other Clinician: Referring Provider: Referral, Self Treating Provider/Extender: Melburn Hake, HOYT Weeks in Treatment: 0 Verbal / Phone Orders: No Diagnosis Coding ICD-10 Coding Code Description E11.622 Type 2 diabetes mellitus with other skin ulcer E11.42 Type 2 diabetes mellitus with diabetic polyneuropathy I87.2 Venous insufficiency (chronic) (peripheral) L97.512 Non-pressure chronic ulcer of other part of right foot with fat layer exposed Wound Cleansing Wound #3 Right,Lateral Toe Great o Clean wound with Normal Saline. o Dial antibacterial soap, wash wounds, rinse and pat dry prior to dressing wounds Anesthetic (add to Medication List) Wound #3 Right,Lateral Toe Great o Topical Lidocaine 4% cream applied to wound bed prior to debridement (In Clinic Only). Primary Wound Dressing Wound #3 Right,Lateral Toe Great o Silver Alginate Secondary Dressing Wound #3 Right,Lateral Toe Great o Conform/Kerlix o Foam Dressing Change Frequency Wound #3 Right,Lateral Toe Great o Change dressing every day. Follow-up Appointments Wound #3 Right,Lateral Toe Great o Return Appointment in 1 week. Services and Therapies o Ankle Brachial Index (ABI) Patient Medications Allergies: No Known Drug Allergies Notifications Medication Indication Start End FARMER, MCCAHILL ([id number]) Notifications Medication Indication Start End Bactrim DS 06/19/2018 DOSE 1 - oral 800 mg-160 mg tablet - 1 tablet oral taken 2 times a day for 14 days. Do not take potassium while taking this medication Electronic Signature(s) Signed: 06/19/2018 4:21:24 PM By: Worthy Keeler PA-C Previous Signature: 06/19/2018 4:19:49 PM Version By: Montey Hora Entered By: Worthy Keeler on 06/19/2018 16:21:24 [name] ([id number]) -------------------------------------------------------------------------------- Problem List Details Patient Name: Thomas Mcguire, Thomas Mcguire. Date of Service: 06/19/2018 2:45 PM Medical Record Number: [id number] Patient Account Number: [id number] Date of Birth/Sex: January 03, 1941 (78 y.o. M) Treating RN: Montey Hora Primary Care Provider: Leanna Battles Other Clinician: Referring Provider: Referral, Self Treating Provider/Extender: STONE III, HOYT Weeks in Treatment: 0 Active Problems ICD-10 Evaluated Encounter Code  Description Active Date Today Diagnosis E11.622 Type 2 diabetes mellitus with other skin ulcer 06/19/2018 No Yes E11.42 Type 2 diabetes mellitus with diabetic polyneuropathy 06/19/2018 No Yes I87.2 Venous insufficiency (chronic) (peripheral) 06/19/2018 No Yes L97.512 Non-pressure chronic ulcer of other part of right foot with fat 06/19/2018 No Yes layer exposed Inactive Problems Resolved Problems Electronic Signature(s) Signed: 06/19/2018 4:26:55 PM By: Worthy Keeler PA-C Entered By: Worthy Keeler on 06/19/2018 15:21:20 [name] ([id number]) -------------------------------------------------------------------------------- Progress Note Details Patient Name: [name]. Date of Service: 06/19/2018 2:45 PM Medical Record Number: [id number] Patient Account Number: [id number] Date of Birth/Sex: 04/14/1940 (78 y.o. 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 He is here for review of wound on the right lower leg. 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 He has chronic edema in his lower legs and uses 20-30 mm compression stockings which she is compliant with. He is a nonsmoker. Beside his diabetes he has hypertension and hypercholesterolemia and in 2013 he had stage III chronic renal failure. He tells me his primary physician Dr. Valetta Fuller at [facility] follows him every 3 months for his kidney failure. He also states he saw a nephrologist once. ABIs in this clinic were noncompressible. He tells me he saw Dr. Doren Custard in 6 or 7 years ago at [facility] vascular in [place]. He does not remember what the issue was. It would appear that the patient has had recurrent blistering in the lower extremities as there are several healed areas on the left anterior and right anterior calves 12/14/16 on evaluation today patient appears to be doing very well with the current three layer compression wraps. His right lower extremity is significantly improved as far as the swelling is concerned. He has been tolerating the wraps without any complication. The wounds also appear to be doing better compared to last week's evaluation. Unfortunately he does have a new dramatic wound to the left interior shin but otherwise things are looking up. No fevers chills noted. Patient does have a appointment with [facility] [place] on December 23, 2016 four arterial studies. 12/22/16 area x2 on right leg are closed. Still one open area on left leg. arterial studies tomorrow 12/28/16; 2 wounds on the right leg are closed. He still has one open area on the left leg. ARTERIAL studies were done on 12/23/16 D showed an ABI on the right of 0.99 and on the left at 0.68. His toe brachial index was 0.26 on the right. Monophasic waveforms at both the posterior tibial and dorsalis pedis arteries. The patient saw Dr. Trula Slade of vascular surgery yesterday. He stated he would  follow him for the remaining wound on the left leg. He has been using silver alginate 01/12/17; the area on the left leg is closed. His original wounds on the right leg are closed as well. He has 20-30 mm compression stockings. He is following with Dr. Lita Mains ham for his arterial issues however his wounds are closed and he is tolerating 20-30 mm stockings Readmission: 06/19/18 patient presents today for readmission into our clinic concerning issues having with his right great toe  which unfortunately started as a blister just the past week. He notes is not having any pain on that he does have neuropathy this is likely the big reason that he is not experiencing any discomfort in particular. Fortunately there's no signs of active infection at this time. No fevers, chills, nausea, or vomiting noted at this time. Patient's wound bed currently showed signs of necrotic tissue on the surface of the planned where he does have some blistering skin at the site where he has a callous. Fortunately there's no signs of systemic infection although he does have a red streak that is extending up from the great toe I'm unsure if this is related directly to an active infection at this time with this is something that should normally there the patient is unsure. He has previously undergone vascular testing but I'm unsure as to whether or not this has been repeated in since we last saw him. Fortunately he does still see [facility] specialist in [place] and it appears they may have an arterial study ordered for him will be looking epic for later this month although we are gonna call and confirm this to be certain. I think that is something that we really do need to do. Thomas Mcguire, Thomas Mcguire ([id number]) Patient History Information obtained from Patient. Allergies No Known Drug Allergies Family History Diabetes - Mother,Father, Hypertension - Mother, No family history of Cancer, Heart Disease, Kidney Disease, Lung  Disease, Seizures, Stroke, Thyroid Problems, Tuberculosis. Social History Former smoker - quit 24 years ago, Marital Status - Married, Alcohol Use - Never, Drug Use - No History, Caffeine Use - Daily. Medical History Eyes Patient has history of Glaucoma Denies history of Cataracts, Optic Neuritis Ear/Nose/Mouth/Throat Denies history of Chronic sinus problems/congestion, Middle ear problems Hematologic/Lymphatic Patient has history of Lymphedema Denies history of Anemia, Hemophilia, Human Immunodeficiency Virus, Sickle Cell Disease Respiratory Denies history of Aspiration, Asthma, Chronic Obstructive Pulmonary Disease (COPD), Pneumothorax, Sleep Apnea, Tuberculosis Cardiovascular Patient has history of Hypertension Denies history of Angina, Arrhythmia, Congestive Heart Failure, Coronary Artery Disease, Deep Vein Thrombosis, Myocardial Infarction, Peripheral Arterial Disease, Peripheral Venous Disease, Phlebitis, Vasculitis Gastrointestinal Denies history of Cirrhosis , Colitis, Crohn s, Hepatitis A, Hepatitis B, Hepatitis C Endocrine Patient has history of Type II Diabetes - insulin dependant Genitourinary Denies history of End Stage Renal Disease Immunological Denies history of Lupus Erythematosus, Raynaud s, Scleroderma Integumentary (Skin) Denies history of History of Burn, History of pressure wounds Musculoskeletal Patient has history of Gout Denies history of Rheumatoid Arthritis, Osteoarthritis, Osteomyelitis Neurologic Denies history of Dementia, Neuropathy, Quadriplegia, Paraplegia, Seizure Disorder Oncologic Denies history of Received Chemotherapy, Received Radiation Psychiatric Denies history of Anorexia/bulimia, Confinement Anxiety Hospitalization/Surgery History - shut placement at [facility] 3 weeks ago. Review of Systems (ROS) Eyes Denies complaints or symptoms of Dry Eyes, Vision Changes, Glasses / Contacts. Ear/Nose/Mouth/Throat Denies complaints or  symptoms of Difficult clearing ears, Sinusitis. Thomas Mcguire, Thomas Mcguire ([id number]) Hematologic/Lymphatic Denies complaints or symptoms of Bleeding / Clotting Disorders, Human Immunodeficiency Virus. Respiratory Denies complaints or symptoms of Chronic or frequent coughs, Shortness of Breath. Cardiovascular Denies complaints or symptoms of Chest pain, LE edema. Gastrointestinal Denies complaints or symptoms of Frequent diarrhea, Nausea, Vomiting. Endocrine Denies complaints or symptoms of Hepatitis, Thyroid disease, Polydypsia (Excessive Thirst). Genitourinary Complains or has symptoms of Kidney failure/ Dialysis - stage 4 kidney disease. Denies complaints or symptoms of Incontinence/dribbling. Immunological Denies complaints or symptoms of Hives, Itching. Integumentary (Skin) Complains or has symptoms of Wounds - great right toe. Denies  complaints or symptoms of Bleeding or bruising tendency, Breakdown, Swelling. Musculoskeletal Denies complaints or symptoms of Muscle Pain, Muscle Weakness. Neurologic Denies complaints or symptoms of Numbness/parasthesias, Focal/Weakness. Psychiatric Denies complaints or symptoms of Anxiety, Claustrophobia. Objective Constitutional patient is hypertensive.. pulse regular and within target range for patient.Marland Kitchen respirations regular, non-labored and within target range for patient.Marland Kitchen temperature within target range for patient.. Well-nourished and well-hydrated in no acute distress. Vitals Time Taken: 2:57 PM, Height: 77 in, Source: Stated, Weight: 205 lbs, Source: Stated, BMI: 24.3, Temperature: 98.3 F, Pulse: 66 bpm, Respiratory Rate: 16 breaths/min, Blood Pressure: 141/59 mmHg. Eyes conjunctiva clear no eyelid edema noted. pupils equal round and reactive to light and accommodation. Ears, Nose, Mouth, and Throat no gross abnormality of ear auricles or external auditory canals. normal hearing noted during conversation. mucus membranes  moist. Respiratory normal breathing without difficulty. clear to auscultation bilaterally. Cardiovascular regular rate and rhythm with normal S1, S2. Absent posterior tibial and dorsalis pedis pulses bilateral lower extremities. no clubbing, cyanosis, significant edema, Gastrointestinal (GI) soft, non-tender, non-distended, +BS. no ventral hernia noted. Musculoskeletal Thomas Mcguire, Thomas Mcguire ([id number]) normal gait and posture. no significant deformity or arthritic changes, no loss or range of motion, no clubbing. Psychiatric this patient is able to make decisions and demonstrates good insight into disease process. Alert and Oriented x 3. pleasant and cooperative. General Notes: During the evaluation today I did perform as light as possible of the surgical debridement in order to clear away some of the necrotic tissue which was mainly blistered tissue although there was some callous and subcutaneous tissue that had to be removed in order to clean this away to try to help the wound to heal. I did not attempt to be too aggressive there may still be some additional necrotic tissue that needs to be removed but again I would not want to be that aggressive the last week confirm that he has sufficient blood flow to support an aggressive debridement. Patient tolerated this today without complication post debridement wound bed appears to be doing rather well. He did not have any pain. Integumentary (Hair, Skin) Wound #3 status is Open. Original cause of wound was Blister. The wound is located on the [employer] The wound measures 1.5cm length x 3cm width x 0.1cm depth; 3.534cm^2 area and 0.353cm^3 volume. The wound is limited to skin breakdown. There is no tunneling or undermining noted. There is a medium amount of serosanguineous drainage noted. The wound margin is flat and intact. There is no granulation within the wound bed. There is a medium (34-66%) amount of necrotic tissue within the  wound bed including Eschar. Assessment Active Problems ICD-10 Type 2 diabetes mellitus with other skin ulcer Type 2 diabetes mellitus with diabetic polyneuropathy Venous insufficiency (chronic) (peripheral) Non-pressure chronic ulcer of other part of right foot with fat layer exposed Procedures Wound #3 Pre-procedure diagnosis of Wound #3 is a Diabetic Wound/Ulcer of the Lower Extremity located on the Right,Lateral Toe Great .Severity of Tissue Pre Debridement is: Fat layer exposed. There was a Excisional Skin/Subcutaneous Tissue Debridement with a total area of 4.5 sq cm performed by STONE III, HOYT E., PA-C. With the following instrument(s): Curette to remove Viable and Non-Viable tissue/material. Material removed includes Callus, Subcutaneous Tissue, and Slough after achieving pain control using Lidocaine 4% Topical Solution. No specimens were taken. A time out was conducted at 15:27, prior to the start of the procedure. A Minimum amount of bleeding was controlled with Pressure. The procedure was tolerated well  with a pain level of 0 throughout and a pain level of 0 following the procedure. Post Debridement Measurements: 1.5cm length x 3cm width x 0.2cm depth; 0.707cm^3 volume. Character of Wound/Ulcer Post Debridement is improved. Severity of Tissue Post Debridement is: Fat layer exposed. Post procedure Diagnosis Wound #3: Same as Pre-Procedure Thomas Mcguire, Thomas Mcguire ([id number]) Plan Wound Cleansing: Wound #3 Right,Lateral Toe Great: Clean wound with Normal Saline. Dial antibacterial soap, wash wounds, rinse and pat dry prior to dressing wounds Anesthetic (add to Medication List): Wound #3 Right,Lateral Toe Great: Topical Lidocaine 4% cream applied to wound bed prior to debridement (In Clinic Only). Primary Wound Dressing: Wound #3 Right,Lateral Toe Great: Silver Alginate Secondary Dressing: Wound #3 Right,Lateral Toe Great: Conform/Kerlix Foam Dressing Change Frequency: Wound  #3 Right,Lateral Toe Great: Change dressing every day. Follow-up Appointments: Wound #3 Right,Lateral Toe Great: Return Appointment in 1 week. Services and Therapies ordered were: Ankle Brachial Index (ABI) The following medication(s) was prescribed: Bactrim DS oral 800 mg-160 mg tablet 1 1 tablet oral taken 2 times a day for 14 days. Do not take potassium while taking this medication starting 06/19/2018 My suggestion at this point is gonna be that we continue with the above wound care measures for the next week and the patient is in agreement with that plan. We will subsequently see were things stand at follow-up. If anything changes or worsens meantime I did go ahead and recommend the arterial studies with ABI and TBI for him if this is not already scheduled with any vascular that I would recommend we get them to schedule this for further evaluation. The patient is in agreement with plan. If anything changes worsens meantime he will contact the office and let me know. Please see above for specific wound care orders. We will see patient for re-evaluation in 1 week(s) here in the clinic. If anything worsens or changes patient will contact our office for additional recommendations. Electronic Signature(s) Signed: 06/19/2018 4:26:55 PM By: Worthy Keeler PA-C Entered By: Worthy Keeler on 06/19/2018 16:21:34 [name] ([id number]) -------------------------------------------------------------------------------- ROS/PFSH Details Patient Name: BOWYN, MERCIER. Date of Service: 06/19/2018 2:45 PM Medical Record Number: [id number] Patient Account Number: [id number] Date of Birth/Sex: May 09, 1940 (78 y.o. M) Treating RN: Army Melia Primary Care Provider: Leanna Battles Other Clinician: Referring Provider: Referral, Self Treating Provider/Extender: STONE III, HOYT Weeks in Treatment: 0 Information Obtained From Patient Eyes Complaints and Symptoms: Negative for: Dry Eyes; Vision  Changes; Glasses / Contacts Medical History: Positive for: Glaucoma Negative for: Cataracts; Optic Neuritis Ear/Nose/Mouth/Throat Complaints and Symptoms: Negative for: Difficult clearing ears; Sinusitis Medical History: Negative for: Chronic sinus problems/congestion; Middle ear problems Hematologic/Lymphatic Complaints and Symptoms: Negative for: Bleeding / Clotting Disorders; Human Immunodeficiency Virus Medical History: Positive for: Lymphedema Negative for: Anemia; Hemophilia; Human Immunodeficiency Virus; Sickle Cell Disease Respiratory Complaints and Symptoms: Negative for: Chronic or frequent coughs; Shortness of Breath Medical History: Negative for: Aspiration; Asthma; Chronic Obstructive Pulmonary Disease (COPD); Pneumothorax; Sleep Apnea; Tuberculosis Cardiovascular Complaints and Symptoms: Negative for: Chest pain; LE edema Medical History: Positive for: Hypertension Negative for: Angina; Arrhythmia; Congestive Heart Failure; Coronary Artery Disease; Deep Vein Thrombosis; Myocardial Infarction; Peripheral Arterial Disease; Peripheral Venous Disease; Phlebitis; Vasculitis Gastrointestinal JAHVON, GOSLINE ([id number]) Complaints and Symptoms: Negative for: Frequent diarrhea; Nausea; Vomiting Medical History: Negative for: Cirrhosis ; Colitis; Crohnos; Hepatitis A; Hepatitis B; Hepatitis C Endocrine Complaints and Symptoms: Negative for: Hepatitis; Thyroid disease; Polydypsia (Excessive Thirst) Medical History: Positive for: Type II Diabetes - insulin dependant Time  with diabetes: over 20 years Treated with: Insulin Blood sugar tested every day: Yes Tested : 4 to 5 times daily Blood sugar testing results: Breakfast: 120; Bedtime: 140 Genitourinary Complaints and Symptoms: 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 Negative for: Incontinence/dribbling Medical History: Negative for: End Stage Renal Disease Immunological Complaints and  Symptoms: Negative for: Hives; Itching Medical History: Negative for: Lupus Erythematosus; Raynaudos; Scleroderma Integumentary (Skin) Complaints and Symptoms: Positive for: Wounds - great right toe Negative for: Bleeding or bruising tendency; Breakdown; Swelling Medical History: Negative for: History of Burn; History of pressure wounds Musculoskeletal Complaints and Symptoms: Negative for: Muscle Pain; Muscle Weakness Medical History: Positive for: Gout Negative for: Rheumatoid Arthritis; Osteoarthritis; Osteomyelitis Neurologic Complaints and Symptoms: Negative for: Numbness/parasthesias; Focal/Weakness JATINDER, MCDONAGH [place] ([id number]) Medical History: Negative for: Dementia; Neuropathy; Quadriplegia; Paraplegia; Seizure Disorder Psychiatric Complaints and Symptoms: Negative for: Anxiety; Claustrophobia Medical History: Negative for: Anorexia/bulimia; Confinement Anxiety Oncologic Medical History: Negative for: Received Chemotherapy; Received Radiation HBO Extended History Items Eyes: Glaucoma Immunizations Pneumococcal Vaccine: Received Pneumococcal Vaccination: Yes Implantable Devices No devices added Hospitalization / Surgery History Type of Hospitalization/Surgery shut placement at [facility] 3 weeks ago Family and Social History Cancer: No; Diabetes: Yes - Mother,Father; Heart Disease: No; Hypertension: Yes - Mother; Kidney Disease: No; Lung Disease: No; Seizures: No; Stroke: No; Thyroid Problems: No; Tuberculosis: No; Former smoker - quit 24 years ago; Marital Status - Married; Alcohol Use: Never; Drug Use: No History; Caffeine Use: Daily; Financial Concerns: No; Food, Clothing or Shelter Needs: No; Support System Lacking: No; Transportation Concerns: No Electronic Signature(s) Signed: 06/19/2018 3:30:44 PM By: Army Melia Signed: 06/19/2018 4:26:55 PM By: Worthy Keeler PA-C Entered By: Army Melia on 06/19/2018 15:01:54 [name]  ([id number]) -------------------------------------------------------------------------------- [place] Details Patient Name: BOWEN, KIA. Date of Service: 06/19/2018 Medical Record Number: [id number] Patient Account Number: [id number] Date of Birth/Sex: [birth year] (78 y.o. M) Treating RN: Montey Hora Primary Care Provider: Leanna Battles Other Clinician: Referring Provider: Referral, Self Treating Provider/Extender: Melburn Hake, HOYT Weeks in Treatment: 0 Diagnosis Coding ICD-10 Codes Code Description E11.622 Type 2 diabetes mellitus with other skin ulcer E11.42 Type 2 diabetes mellitus with diabetic polyneuropathy I87.2 Venous insufficiency (chronic) (peripheral) L97.512 Non-pressure chronic ulcer of other part of right foot with fat layer exposed Facility Procedures CPT4 Code: 72094709 Description: [place] VISIT-LEV 3 EST PT Modifier: Quantity: 1 CPT4 Code: 62836629 Description: 47654 - DEB SUBQ TISSUE 20 SQ CM/< ICD-10 Diagnosis Description L97.512 Non-pressure chronic ulcer of other part of right foot with fat Modifier: layer exposed Quantity: 1 Physician Procedures CPT4 Code: 6503546 Description: 56812 - WC PHYS LEVEL 4 - EST PT ICD-10 Diagnosis Description E11.622 Type 2 diabetes mellitus with other skin ulcer E11.42 Type 2 diabetes mellitus with diabetic polyneuropathy I87.2 Venous insufficiency (chronic) (peripheral) L97.512  Non-pressure chronic ulcer of other part of right foot with fat Modifier: 25 layer exposed Quantity: 1 CPT4 Code: 7517001 Description: 74944 - WC PHYS SUBQ TISS 20 SQ CM ICD-10 Diagnosis Description L97.512 Non-pressure chronic ulcer of other part of right foot with fat Modifier: layer exposed Quantity: 1 Electronic Signature(s) Signed: 06/19/2018 4:26:55 PM By: Worthy Keeler PA-C Entered By: Worthy Keeler on 06/19/2018 16:19:46

## 2018-06-19 NOTE — Progress Notes (Signed)
OKIE, BOGACZ ([id number]) Visit Report for 06/19/2018 Allergy List Details Patient Name: Thomas Mcguire, Thomas Mcguire. Date of Service: 06/19/2018 2:45 PM Medical Record Number: [id number] Patient Account Number: [id number] Date of Birth/Sex: [birth year] (78 y.o. M) Treating RN: Army Melia Primary Care Hamp Moreland: Leanna Battles Other Clinician: Referring Shandi Godfrey: Referral, Self Treating Nathin Saran/Extender: STONE III, HOYT Weeks in Treatment: 0 Allergies Active Allergies No Known Drug Allergies Allergy Notes Electronic Signature(s) Signed: 06/19/2018 3:30:44 PM By: Army Melia Entered By: Army Melia on 06/19/2018 14:58:41 [name] ([id number]) -------------------------------------------------------------------------------- Arrival Information Details Patient Name: ANASTASIO, WOGAN. Date of Service: 06/19/2018 2:45 PM Medical Record Number: [id number] Patient Account Number: [id number] Date of Birth/Sex: Oct 30, 1940 (78 y.o. M) Treating RN: Army Melia Primary Care Ladislaus Repsher: Leanna Battles Other Clinician: Referring Cherre Kothari: Referral, Self Treating Everhett Bozard/Extender: Melburn Hake, HOYT Weeks in Treatment: 0 Visit Information Patient Arrived: Ambulatory Arrival Time: 14:56 Accompanied By: self Transfer Assistance: None History Since Last Visit Added or deleted any medications: No Any new allergies or adverse reactions: No Had a fall or experienced change in activities of daily living that may affect risk of falls: No Signs or symptoms of abuse/neglect since last visito No Hospitalized since last visit: No Electronic Signature(s) Signed: 06/19/2018 3:30:44 PM By: Army Melia Entered By: Army Melia on 06/19/2018 14:57:07 [name] ([id number]) -------------------------------------------------------------------------------- Clinic Level of Care Assessment Details Patient Name: [name]. Date of Service: 06/19/2018 2:45 PM Medical Record Number:  [id number] Patient Account Number: [id number] Date of Birth/Sex: 06/21/1940 (78 y.o. M) Treating RN: Montey Hora Primary Care Prentiss Hammett: Leanna Battles Other Clinician: Referring Yadir Zentner: Referral, Self Treating Jenesys Casseus/Extender: STONE III, HOYT Weeks in Treatment: 0 Clinic Level of Care Assessment Items TOOL 1 Quantity Score []  - Use when EandM and Procedure is performed on INITIAL visit 0 ASSESSMENTS - Nursing Assessment / Reassessment X - General Physical Exam (combine w/ comprehensive assessment (listed just below) when 1 20 performed on new pt. evals) X- 1 25 Comprehensive Assessment (HX, ROS, Risk Assessments, Wounds Hx, etc.) ASSESSMENTS - Wound and Skin Assessment / Reassessment []  - Dermatologic / Skin Assessment (not related to wound area) 0 ASSESSMENTS - Ostomy and/or Continence Assessment and Care []  - Incontinence Assessment and Management 0 []  - 0 Ostomy Care Assessment and Management (repouching, etc.) PROCESS - Coordination of Care X - Simple Patient / Family Education for ongoing care 1 15 []  - 0 Complex (extensive) Patient / Family Education for ongoing care X- 1 10 Staff obtains Programmer, systems, Records, Test Results / Process Orders []  - 0 Staff telephones HHA, Nursing Homes / Clarify orders / etc []  - 0 Routine Transfer to another Facility (non-emergent condition) []  - 0 Routine Hospital Admission (non-emergent condition) X- 1 15 New Admissions / Biomedical engineer / Ordering NPWT, Apligraf, etc. []  - 0 Emergency Hospital Admission (emergent condition) PROCESS - Special Needs []  - Pediatric / Minor Patient Management 0 []  - 0 Isolation Patient Management []  - 0 Hearing / Language / Visual special needs []  - 0 Assessment of Community assistance (transportation, D/C planning, etc.) []  - 0 Additional assistance / Altered mentation []  - 0 Support Surface(s) Assessment (bed, cushion, seat, etc.) JIA, MOHAMED ([id number]) INTERVENTIONS -  Miscellaneous []  - External ear exam 0 []  - 0 Patient Transfer (multiple staff / Civil Service fast streamer / Similar devices) []  - 0 Simple Staple / Suture removal (25 or less) []  - 0 Complex Staple / Suture removal (26 or more) []  - 0 Hypo/Hyperglycemic Management (do not check if billed  separately) X- 1 15 Ankle / Brachial Index (ABI) - do not check if billed separately Has the patient been seen at the hospital within the last three years: Yes Total Score: 100 Level Of Care: New/Established - Level 3 Electronic Signature(s) Signed: 06/19/2018 4:19:49 PM By: Montey Hora Entered By: Montey Hora on 06/19/2018 16:08:42 [name] ([id number]) -------------------------------------------------------------------------------- Lower Extremity Assessment Details Patient Name: DESSIE, DELCARLO. Date of Service: 06/19/2018 2:45 PM Medical Record Number: [id number] Patient Account Number: [id number] Date of Birth/Sex: 06-Jul-1940 (78 y.o. M) Treating RN: Army Melia Primary Care Jacai Kipp: Leanna Battles Other Clinician: Referring Delcia Spitzley: Referral, Self Treating Jenina Moening/Extender: STONE III, HOYT Weeks in Treatment: 0 Edema Assessment Assessed: [Left: No] [Right: No] Edema: [Left: N] [Right: o] Calf Left: Right: Point of Measurement: 34 cm From Medial Instep 31 cm cm Ankle Left: Right: Point of Measurement: 8 cm From Medial Instep 25 cm cm Vascular Assessment Pulses: Dorsalis Pedis Palpable: [Left:No] Electronic Signature(s) Signed: 06/19/2018 3:30:44 PM By: Army Melia Entered By: Army Melia on 06/19/2018 15:18:53 [name] ([id number]) -------------------------------------------------------------------------------- Multi Wound Chart Details Patient Name: [name]. Date of Service: 06/19/2018 2:45 PM Medical Record Number: [id number] Patient Account Number: [id number] Date of Birth/Sex: 09-Sep-1940 (78 y.o. M) Treating RN: Montey Hora Primary Care Tighe Gitto:  Leanna Battles Other Clinician: Referring Chiniqua Kilcrease: Referral, Self Treating Jasamine Pottinger/Extender: STONE III, HOYT Weeks in Treatment: 0 Vital Signs Height(in): 77 Pulse(bpm): 66 Weight(lbs): 205 Blood Pressure(mmHg): 141/59 Body Mass Index(BMI): 24 Temperature(F): 98.3 Respiratory Rate 16 (breaths/min): Photos: [N/A:N/A] Wound Location: Right Toe Great - Lateral N/A N/A Wounding Event: Blister N/A N/A Primary Etiology: Diabetic Wound/Ulcer of the N/A N/A Lower Extremity Comorbid History: Glaucoma, Lymphedema, N/A N/A Hypertension, Type II Diabetes, Gout Date Acquired: 06/13/2018 N/A N/A Weeks of Treatment: 0 N/A N/A Wound Status: Open N/A N/A Measurements L x W x D 1.5x3x0.1 N/A N/A (cm) Area (cm) : 3.534 N/A N/A Volume (cm) : 0.353 N/A N/A % Reduction in Area: 0.00% N/A N/A % Reduction in Volume: 0.00% N/A N/A Classification: Grade 1 N/A N/A Exudate Amount: Medium N/A N/A Exudate Type: Serosanguineous N/A N/A Exudate Color: red, brown N/A N/A Wound Margin: Flat and Intact N/A N/A Granulation Amount: None Present (0%) N/A N/A Necrotic Amount: Medium (34-66%) N/A N/A Necrotic Tissue: Eschar N/A N/A Exposed Structures: Fascia: No N/A N/A Fat Layer (Subcutaneous Tissue) Exposed: No Tendon: No Muscle: No SIRE, POET ([id number]) Joint: No Bone: No Limited to Skin Breakdown Epithelialization: None N/A N/A Debridement: Debridement - Excisional N/A N/A Pre-procedure 15:27 N/A N/A Verification/Time Out Taken: Pain Control: Lidocaine 4% Topical Solution N/A N/A Tissue Debrided: Callus, Subcutaneous, Slough N/A N/A Level: Skin/Subcutaneous Tissue N/A N/A Debridement Area (sq cm): 4.5 N/A N/A Instrument: Curette N/A N/A Bleeding: Minimum N/A N/A Hemostasis Achieved: Pressure N/A N/A Procedural Pain: 0 N/A N/A Post Procedural Pain: 0 N/A N/A Debridement Treatment Procedure was tolerated well N/A N/A Response: Post Debridement 1.5x3x0.2 N/A  N/A Measurements L x W x D (cm) Post Debridement Volume: 0.707 N/A N/A (cm) Procedures Performed: Debridement N/A N/A Treatment Notes Electronic Signature(s) Signed: 06/19/2018 4:08:18 PM By: Montey Hora Entered By: Montey Hora on 06/19/2018 16:08:17 [name] ([id number]) -------------------------------------------------------------------------------- Multi-Disciplinary Care Plan Details Patient Name: SEQUOIA, MINCEY. Date of Service: 06/19/2018 2:45 PM Medical Record Number: [id number] Patient Account Number: [id number] Date of Birth/Sex: 1940-04-25 (78 y.o. M) Treating RN: Montey Hora Primary Care Katelan Hirt: Leanna Battles Other Clinician: Referring Nitya Cauthon: Referral, Self Treating Tema Alire/Extender: STONE III, HOYT Weeks in Treatment:  0 Active Inactive Abuse / Safety / Falls / Self Care Management Nursing Diagnoses: Potential for falls Goals: Patient will not experience any injury related to falls Date Initiated: 06/19/2018 Target Resolution Date: 09/16/2018 Goal Status: Active Interventions: Assess fall risk on admission and as needed Notes: Orientation to the Wound Care Program Nursing Diagnoses: Knowledge deficit related to the wound healing center program Goals: Patient/caregiver will verbalize understanding of the [place] Program Date Initiated: 06/19/2018 Target Resolution Date: 09/16/2018 Goal Status: Active Interventions: Provide education on orientation to the wound center Notes: Pain, Acute or Chronic Nursing Diagnoses: Pain, acute or chronic: actual or potential Goals: Patient will verbalize adequate pain control and receive pain control interventions during procedures as needed Date Initiated: 06/19/2018 Target Resolution Date: 09/16/2018 Goal Status: Active Interventions: Assess comfort goal upon admission KHALEEM, BURCHILL ([id number]) Notes: Wound/Skin Impairment Nursing Diagnoses: Impaired tissue  integrity Goals: Ulcer/skin breakdown will heal within 14 weeks Date Initiated: 06/19/2018 Target Resolution Date: 09/16/2018 Goal Status: Active Interventions: Assess patient/caregiver ability to obtain necessary supplies Assess patient/caregiver ability to perform ulcer/skin care regimen upon admission and as needed Assess ulceration(s) every visit Notes: Electronic Signature(s) Signed: 06/19/2018 4:08:05 PM By: Montey Hora Entered By: Montey Hora on 06/19/2018 16:08:05 [name] ([id number]) -------------------------------------------------------------------------------- Pain Assessment Details Patient Name: KALADIN, NOSEWORTHY. Date of Service: 06/19/2018 2:45 PM Medical Record Number: [id number] Patient Account Number: [id number] Date of Birth/Sex: 1940/12/18 (78 y.o. M) Treating RN: Army Melia Primary Care Bridey Brookover: Leanna Battles Other Clinician: Referring Jalonda Antigua: Referral, Self Treating Lyla Jasek/Extender: STONE III, HOYT Weeks in Treatment: 0 Active Problems Location of Pain Severity and Description of Pain Patient Has Paino No Site Locations Pain Management and Medication Current Pain Management: Electronic Signature(s) Signed: 06/19/2018 3:30:44 PM By: Army Melia Entered By: Army Melia on 06/19/2018 14:57:16 [name] ([id number]) -------------------------------------------------------------------------------- Patient/Caregiver Education Details Patient Name: JAYDIEN, PANEPINTO. Date of Service: 06/19/2018 2:45 PM Medical Record Number: [id number] Patient Account Number: [id number] Date of Birth/Gender: 09-14-1940 (78 y.o. M) Treating RN: Montey Hora Primary Care Physician: Leanna Battles Other Clinician: Referring Physician: Referral, Self Treating Physician/Extender: Melburn Hake, HOYT Weeks in Treatment: 0 Education Assessment Education Provided To: Patient Education Topics Provided Wound/Skin Impairment: Handouts: Other: wound care  as ordered Methods: Demonstration, Explain/Verbal Responses: State content correctly Electronic Signature(s) Signed: 06/19/2018 4:19:49 PM By: Montey Hora Entered By: Montey Hora on 06/19/2018 16:09:04 [name] ([id number]) -------------------------------------------------------------------------------- Wound Assessment Details Patient Name: BRYNE, LINDON. Date of Service: 06/19/2018 2:45 PM Medical Record Number: [id number] Patient Account Number: [id number] Date of Birth/Sex: September 24, 1940 (78 y.o. M) Treating RN: Army Melia Primary Care Wynell Halberg: Leanna Battles Other Clinician: Referring Walter Grima: Referral, Self Treating Ulyses Panico/Extender: STONE III, HOYT Weeks in Treatment: 0 Wound Status Wound Number: 3 Primary Diabetic Wound/Ulcer of the Lower Extremity Etiology: Wound Location: Right Toe Great - Lateral Wound Status: Open Wounding Event: Blister Comorbid Glaucoma, Lymphedema, Hypertension, Type Date Acquired: 06/13/2018 History: II Diabetes, Gout Weeks Of Treatment: 0 Clustered Wound: No Photos Photo Uploaded By: Army Melia on 06/19/2018 15:28:54 Wound Measurements Length: (cm) 1.5 Width: (cm) 3 Depth: (cm) 0.1 Area: (cm) 3.534 Volume: (cm) 0.353 % Reduction in Area: 0% % Reduction in Volume: 0% Epithelialization: None Tunneling: No Undermining: No Wound Description Classification: Grade 1 Foul Odo Wound Margin: Flat and Intact Slough/F Exudate Amount: Medium Exudate Type: Serosanguineous Exudate Color: red, brown r After Cleansing: No ibrino Yes Wound Bed Granulation Amount: None Present (0%) Exposed Structure Necrotic Amount: Medium (34-66%) Fascia Exposed: No Necrotic  Quality: Eschar Fat Layer (Subcutaneous Tissue) Exposed: No Tendon Exposed: No Muscle Exposed: No Joint Exposed: No Bone Exposed: No Limited to Skin Breakdown EUCLID, CASSETTA ([id number]) Treatment Notes Wound #3 (Right, Lateral Toe Great) Notes silvercel,  foam, conform Electronic Signature(s) Signed: 06/19/2018 3:30:44 PM By: Army Melia Signed: 06/19/2018 4:26:55 PM By: Worthy Keeler PA-C Entered By: Worthy Keeler on 06/19/2018 15:23:48 RAJEEV, ESCUE ([id number]) -------------------------------------------------------------------------------- [place] Details Patient Name: ROMANI, WILBON. Date of Service: 06/19/2018 2:45 PM Medical Record Number: [id number] Patient Account Number: [id number] Date of Birth/Sex: April 21, 1940 (78 y.o. M) Treating RN: Army Melia Primary Care Imanol Bihl: Leanna Battles Other Clinician: Referring Jevaun Strick: Referral, Self Treating Myeasha Ballowe/Extender: STONE III, HOYT Weeks in Treatment: 0 Vital Signs Time Taken: 14:57 Temperature (F): 98.3 Height (in): 77 Pulse (bpm): 66 Source: Stated Respiratory Rate (breaths/min): 16 Weight (lbs): 205 Blood Pressure (mmHg): 141/59 Source: Stated Reference Range: 80 - 120 mg / dl Body Mass Index (BMI): 24.3 Electronic Signature(s) Signed: 06/19/2018 3:30:44 PM By: Army Melia Entered By: Army Melia on 06/19/2018 14:57:52

## 2018-06-22 ENCOUNTER — Other Ambulatory Visit: Payer: Self-pay

## 2018-06-22 ENCOUNTER — Inpatient Hospital Stay: Payer: Medicare Other | Attending: Oncology | Admitting: Oncology

## 2018-06-22 ENCOUNTER — Inpatient Hospital Stay: Payer: Medicare Other

## 2018-06-22 ENCOUNTER — Encounter: Payer: Self-pay | Admitting: Oncology

## 2018-06-22 ENCOUNTER — Telehealth: Payer: Self-pay | Admitting: Oncology

## 2018-06-22 VITALS — BP 138/58 | HR 64 | Temp 98.9°F | Resp 17 | Ht 78.0 in | Wt 204.5 lb

## 2018-06-22 DIAGNOSIS — D649 Anemia, unspecified: Secondary | ICD-10-CM

## 2018-06-22 DIAGNOSIS — E1122 Type 2 diabetes mellitus with diabetic chronic kidney disease: Secondary | ICD-10-CM | POA: Diagnosis not present

## 2018-06-22 DIAGNOSIS — N189 Chronic kidney disease, unspecified: Secondary | ICD-10-CM

## 2018-06-22 DIAGNOSIS — C9110 Chronic lymphocytic leukemia of B-cell type not having achieved remission: Secondary | ICD-10-CM | POA: Diagnosis present

## 2018-06-22 DIAGNOSIS — I129 Hypertensive chronic kidney disease with stage 1 through stage 4 chronic kidney disease, or unspecified chronic kidney disease: Secondary | ICD-10-CM | POA: Diagnosis not present

## 2018-06-22 LAB — CBC WITH DIFFERENTIAL (CANCER CENTER ONLY)
Abs Immature Granulocytes: 0.06 10*3/uL (ref 0.00–0.07)
Basophils Absolute: 0.1 10*3/uL (ref 0.0–0.1)
Basophils Relative: 0 %
Eosinophils Absolute: 0.1 10*3/uL (ref 0.0–0.5)
Eosinophils Relative: 1 %
HCT: 38 % — ABNORMAL LOW (ref 39.0–52.0)
Hemoglobin: 12.4 g/dL — ABNORMAL LOW (ref 13.0–17.0)
Immature Granulocytes: 0 %
Lymphocytes Relative: 64 %
Lymphs Abs: 15.3 10*3/uL — ABNORMAL HIGH (ref 0.7–4.0)
MCH: 26.8 pg (ref 26.0–34.0)
MCHC: 32.6 g/dL (ref 30.0–36.0)
MCV: 82.1 fL (ref 80.0–100.0)
Monocytes Absolute: 3 10*3/uL — ABNORMAL HIGH (ref 0.1–1.0)
Monocytes Relative: 12 %
Neutro Abs: 5.5 10*3/uL (ref 1.7–7.7)
Neutrophils Relative %: 23 %
Platelet Count: 145 10*3/uL — ABNORMAL LOW (ref 150–400)
RBC: 4.63 MIL/uL (ref 4.22–5.81)
RDW: 15.8 % — ABNORMAL HIGH (ref 11.5–15.5)
WBC Count: 24 10*3/uL — ABNORMAL HIGH (ref 4.0–10.5)
nRBC: 0 % (ref 0.0–0.2)

## 2018-06-22 NOTE — Telephone Encounter (Signed)
Scheduled appt per 6/11 los. A calendar will be mailed out. °

## 2018-06-22 NOTE — Progress Notes (Addendum)
  [place] OFFICE PROGRESS NOTE   Diagnosis: CLL  INTERVAL HISTORY:   Thomas Mcguire returns as scheduled.  He overall feels well.  No interim illnesses or infections.  No fevers or sweats.  He reports a good appetite.  He has lost weight since his last visit.  He reports the weight loss is intentional.  He attributes the weight loss to a change in his diet.  He has not noticed any enlarged lymph nodes.  No fever, cough, shortness of breath.  He continues monthly B12 injections.  Objective:  Vital signs in last 24 hours:  Blood pressure (!) 138/58, pulse 64, temperature 98.9 F (37.2 C), temperature source Oral, resp. rate 17, height 6\' 6"  (1.981 m), weight 204 lb 8 oz (92.8 kg), SpO2 100 %.    Lymphatics: Tiny palpable right scalene node.  1 cm soft mobile left axillary node.  No other palpable cervical, supraclavicular, axillary or inguinal lymph nodes. GI: Abdomen soft and nontender.  No hepatosplenomegaly. Vascular: No significant leg edema.    Lab Results:  Lab Results  Component Value Date   WBC 24.0 (H) 06/22/2018   HGB 12.4 (L) 06/22/2018   HCT 38.0 (L) 06/22/2018   MCV 82.1 06/22/2018   PLT 145 (L) 06/22/2018   NEUTROABS 5.5 06/22/2018    Imaging:  No results found.  Medications: I have reviewed the patient's current medications.  Assessment/Plan: 1.  Chronic lymphocytic leukemia 2.  Anemia 3.  Chronic renal failure 4.  Vitamin H60, folic acid?  Deficiency 5.  Diabetes 6.  Hypertension  Disposition: Thomas Mcguire has CLL.  He appears asymptomatic.  He remains stable from a hematologic standpoint.  There is no indication for treatment.  He understands the increased risk for infections related to CLL.  He will seek medical attention should he develop symptoms of an infection.  He understands the importance of remaining up-to-date on vaccines and will obtain the influenza vaccine when available later this year.  He has lost weight since his last  visit.  It appears the weight loss is intentional.  He will return for a CBC and follow-up visit in 6 months.  He will contact the office in the interim with any problems.  Patient seen with Dr. Benay Spice.    Masaichi Kracht ANP/GNP-BC   06/22/2018  3:30 PM  This was a shared visit with [name].  Mr. Mcquitty appears unchanged.  He has early stage CLL.  He will return for an office visit and CBC in 6 months.  Julieanne Manson, MD

## 2018-06-26 ENCOUNTER — Encounter: Payer: Medicare Other | Admitting: Physician Assistant

## 2018-06-26 ENCOUNTER — Other Ambulatory Visit: Payer: Self-pay

## 2018-06-26 DIAGNOSIS — E11621 Type 2 diabetes mellitus with foot ulcer: Secondary | ICD-10-CM | POA: Diagnosis not present

## 2018-06-27 ENCOUNTER — Telehealth (HOSPITAL_COMMUNITY): Payer: Self-pay | Admitting: Rehabilitation

## 2018-06-27 NOTE — Telephone Encounter (Signed)
The above patient or their representative was contacted and gave the following answers to these questions:         Do you have any of the following symptoms? No Fever                    Cough                   Shortness of breath  Do  you have any of the following other symptoms? No  muscle pain         vomiting,        diarrhea        rash         weakness        red eye        abdominal pain         bruising         bleeding              joint pain           severe headache  Have you been in contact with someone who was or has been sick in the past 2 weeks? No Yes                 Unsure                         Unable to assess   Does the person that you were in contact with have any of the following symptoms?  Cough         shortness of breath           muscle pain         vomiting,            diarrhea            rash            weakness           fever            red eye           abdominal pain          bruising  or  bleeding                joint pain                severe headache             Have you  or someone you have been in contact with traveled internationally in the last month?  No      If yes, which countries?  Have you  or someone you have been in contact with traveled outside [place] in the last month?  No      If yes, which state and city?  COMMENTS OR ACTION PLAN FOR THIS PATIENT:    

## 2018-06-28 ENCOUNTER — Ambulatory Visit: Payer: Medicare Other | Admitting: Vascular Surgery

## 2018-06-28 ENCOUNTER — Encounter: Payer: Self-pay | Admitting: Vascular Surgery

## 2018-06-28 ENCOUNTER — Other Ambulatory Visit: Payer: Self-pay | Admitting: *Deleted

## 2018-06-28 ENCOUNTER — Encounter: Payer: Self-pay | Admitting: *Deleted

## 2018-06-28 ENCOUNTER — Other Ambulatory Visit: Payer: Self-pay

## 2018-06-28 VITALS — BP 135/65 | HR 59 | Temp 97.2°F | Resp 20 | Ht 78.0 in | Wt 210.0 lb

## 2018-06-28 DIAGNOSIS — I70261 Atherosclerosis of native arteries of extremities with gangrene, right leg: Secondary | ICD-10-CM

## 2018-06-28 NOTE — Progress Notes (Signed)
REASON FOR CONSULT:    Black toe.  The consult is requested by Dr. Carolin Sicks.  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  He has a barely audible dorsalis pedis and posterior tibial signal with the Doppler on the right.  This is clearly a limb threatening situation.  I recommend that we proceed with arteriography to assess any options for revascularization.  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  If he has disease amenable to angioplasty this would potentially be done at the same time.  I would not be able to do his arteriogram until 07/07/18 and therefore I have scheduled it with Dr. Donzetta Matters this coming Monday.  He does have some mild cellulitis and is on po Bactrim.  I have reviewed with the patient the indications for arteriography. In addition, I have reviewed the potential complications of arteriography including but not limited to: Bleeding, arterial injury, arterial thrombosis, dye action, renal insufficiency, or other unpredictable medical problems. I have explained to the patient that if we find disease amenable to angioplasty we could potentially address this at the same time. I have discussed the potential complications of angioplasty and stenting, including but not limited to: Bleeding, arterial thrombosis, arterial injury, dissection, or the need for surgical intervention.  STAGE V CHRONIC KIDNEY DISEASE: This patient has a right radiocephalic fistula.  The fistula does have a weak thrill and an audible bruit.  He is only 1 month postop.  He is scheduled for a follow-up duplex in the next month.  BILATERAL CAROTID BRUITS: I did detect soft carotid bruits.  When he comes in for his office visit after his  arteriogram we will obtain a carotid duplex scan.  I have discussed this with him.  Deitra Mayo, MD, FACS Beeper [phone] Office: [phone]   HPI:   Thomas Mcguire is a pleasant 78 y.o. male, who is seen as a new patient today with a black toe.  He states that he developed a blister on his right second toe 2 weeks ago which progressed to where he now has a black eschar over the medial aspect of his right great toe.  I do not get any history of trauma to the toe.  He does not describe symptoms consistent with claudication although I think his activity is very limited.  He denies rest pain.  His risk factors for peripheral vascular disease include diabetes and hypertension.  According to the records, he also has hyperlipidemia.  He denies any family history of premature cardiovascular disease.  He quit smoking in 1998.  He denies any recent chills or fever.  This patient has stage V chronic kidney disease.  He had a right radiocephalic fistula placed on 05/23/2018.  He is not yet on dialysis.  I did review his most recent note from the cancer center.  The patient has a history of chronic lymphocytic leukemia.  He denies any history of stroke, TIAs, expressive or receptive aphasia, or amaurosis fugax.  Past Medical History:  Diagnosis Date  . Anemia    low iron  . Arthritis   . Chronic renal insufficiency    CHECKED Q3MOS PER PT. Stage 4 as of 05/22/2018 per pt.  . Diabetes mellitus   .  Gout    Has not had recently- 08/24/11  . Hyperlipidemia   . Hypertension   . Neuromuscular disorder ([place])    Neuropathy    Family History  Problem Relation Age of Onset  . Colon cancer Neg Hx     SOCIAL HISTORY: Social History   Socioeconomic History  . Marital status: Married    Spouse name: [name]  . Number of children: Not on file  . Years of education: Not on file  . Highest education level: Not on file  Occupational History  . Not on file  Social Needs  . Financial  resource strain: Not on file  . Food insecurity    Worry: Not on file    Inability: Not on file  . Transportation needs    Medical: Not on file    Non-medical: Not on file  Tobacco Use  . Smoking status: Former Smoker    Quit date: 03/11/1996    Years since quitting: 22.3  . Smokeless tobacco: Never Used  Substance and Sexual Activity  . Alcohol use: No  . Drug use: No  . Sexual activity: Not Currently  Lifestyle  . Physical activity    Days per week: Not on file    Minutes per session: Not on file  . Stress: Not on file  Relationships  . Social Herbalist on phone: Not on file    Gets together: Not on file    Attends religious service: Not on file    Active member of club or organization: Not on file    Attends meetings of clubs or organizations: Not on file    Relationship status: Not on file  . Intimate partner violence    Fear of current or ex partner: Not on file    Emotionally abused: Not on file    Physically abused: Not on file    Forced sexual activity: Not on file  Other Topics Concern  . Not on file  Social History Narrative  . Not on file    No Known Allergies  Current Outpatient Medications  Medication Sig Dispense Refill  . allopurinol (ZYLOPRIM) 300 MG tablet Take 300 mg by mouth daily.      Marland Kitchen amLODipine (NORVASC) 5 MG tablet Take 5 mg by mouth daily.     Marland Kitchen aspirin 81 MG tablet Take 81 mg by mouth daily.    Marland Kitchen BACTRIM DS 800-160 MG tablet Take 1 tablet by mouth 2 (two) times a day.    . calcitRIOL (ROCALTROL) 0.25 MCG capsule Take 0.25 mcg by mouth daily.   5  . carvedilol (COREG) 25 MG tablet Take 12.5 mg by mouth 2 (two) times daily with a meal.     . COD LIVER OIL PO Take 1 capsule by mouth 2 (two) times daily.     . cyanocobalamin (,VITAMIN B-12,) 1000 MCG/ML injection Vitamin B12 injection: 1000 mcg (1 mL) by subcu injection once daily for 7 days; then once weekly for 4 weeks; then once monthly thereafter.  7 07/15/2010 (Patient taking  differently: Inject 1,000 mcg into the muscle every 30 (thirty) days. ) 12 mL 1  . doxazosin (CARDURA) 2 MG tablet Take 2 mg by mouth every morning.     . ferrous sulfate 325 (65 FE) MG tablet Take 325 mg by mouth 2 (two) times daily with a meal.     . folic acid (FOLVITE) 1 MG tablet Take 1 tablet (1 mg total) by mouth daily. 90 tablet  0  . furosemide (LASIX) 40 MG tablet Take 40 mg by mouth 2 (two) times daily.     Marland Kitchen HYDROcodone-acetaminophen (NORCO/VICODIN) 5-325 MG tablet Take 1 tablet by mouth every 6 (six) hours as needed (pain.). (Patient not taking: Reported on 06/22/2018) 10 tablet 0  . insulin NPH (HUMULIN N,NOVOLIN N) 100 UNIT/ML injection Inject 50 Units into the skin 2 (two) times daily.     . insulin regular (HUMULIN R,NOVOLIN R) 100 units/mL injection Inject 22-25 Units into the skin 3 (three) times daily before meals. Sliding Scale    . isosorbide mononitrate (IMDUR) 30 MG 24 hr tablet Take 30 mg by mouth daily.  6  . potassium chloride (K-DUR) 10 MEQ tablet Take 10 mEq by mouth daily.  3  . pravastatin (PRAVACHOL) 40 MG tablet Take 40 mg by mouth at bedtime.      No current facility-administered medications for this visit.     REVIEW OF SYSTEMS:  [X]  denotes positive finding, [ ]  denotes negative finding Cardiac  Comments:  Chest pain or chest pressure:    Shortness of breath upon exertion: x   Short of breath when lying flat:    Irregular heart rhythm:        Vascular    Pain in calf, thigh, or hip brought on by ambulation:    Pain in feet at night that wakes you up from your sleep:     Blood clot in your veins:    Leg swelling:         Pulmonary    Oxygen at home:    Productive cough:     Wheezing:         Neurologic    Sudden weakness in arms or legs:     Sudden numbness in arms or legs:     Sudden onset of difficulty speaking or slurred speech:    Temporary loss of vision in one eye:     Problems with dizziness:         Gastrointestinal    Blood in stool:      Vomited blood:         Genitourinary    Burning when urinating:     Blood in urine:        Psychiatric    Major depression:         Hematologic    Bleeding problems:    Problems with blood clotting too easily:        Skin    Rashes or ulcers:        Constitutional    Fever or chills:     PHYSICAL EXAM:   Vitals:   06/28/18 1116  BP: 135/65  Pulse: (!) 59  Resp: 20  Temp: (!) 97.2 F (36.2 C)  SpO2: 100%  Weight: 210 lb (95.3 kg)  Height: 6\' 6"  (1.981 m)    GENERAL: The patient is a well-nourished male, in no acute distress. The vital signs are documented above. CARDIAC: There is a regular rate and rhythm.  VASCULAR: He has soft bilateral carotid bruits. On the right side he has a palpable femoral pulse.  I cannot palpate a popliteal or pedal pulses.  He has a dampened monophasic posterior tibial and dorsalis pedis signal on the right with the Doppler. On the left side he has a palpable femoral pulse.  I cannot palpate a popliteal pulse although he does have his jeans on.  I cannot palpate pedal pulses.  He does have a monophasic  dorsalis pedis and posterior tibial signal on the left.  Doppler signals on the left are much brisker. He has no significant lower extremity swelling. PULMONARY: There is good air exchange bilaterally without wheezing or rales. ABDOMEN: Soft and non-tender with normal pitched bowel sounds.  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    NEUROLOGIC: No focal weakness or paresthesias are detected. SKIN: He has dry gangrene of the right great toe as documented below.  There is some mild cellulitis associated with this.    PSYCHIATRIC: The patient has a normal affect.  DATA:    ARTERIAL DOPPLER STUDY: Most recent arterial Doppler study that I can find was in December 2018.  At that time, on the right side he had a monophasic dorsalis pedis and posterior tibial signal with an ABI of 99% although this was  likely falsely elevated.  Toe pressure was 47 mmHg.  On the left side he had a monophasic dorsalis pedis and posterior tibial signal.  ABI was 68%.  Toe pressure could not be obtained.  His arteries were noted to be calcified and noncompressible.  LABS: I reviewed his labs from 06/22/2018.  He had a white blood cell count of 24,000.  Hemoglobin 12.4.  Platelets 145,000.  His GFR on 12/28/2017 was 10.

## 2018-06-28 NOTE — Progress Notes (Addendum)
Thomas Mcguire, Thomas Mcguire ([id number]) Visit Report for 06/26/2018 Arrival Information Details Patient Name: Thomas Mcguire, Thomas Mcguire. Date of Service: 06/26/2018 10:30 AM Medical Record Number: [id number] Patient Account Number: [id number] Date of Birth/Sex: 05-12-1940 (78 y.o. M) Treating RN: Harold Barban Primary Care Lawerence Dery: Leanna Battles Other Clinician: Referring Vernia Teem: Leanna Battles Treating Tava Peery/Extender: Melburn Hake, HOYT Weeks in Treatment: 1 Visit Information History Since Last Visit Added or deleted any medications: No Patient Arrived: Ambulatory Any new allergies or adverse reactions: No Arrival Time: 10:34 Had a fall or experienced change in No Accompanied By: self activities of daily living that may affect Transfer Assistance: None risk of falls: Patient Identification Verified: Yes Signs or symptoms of abuse/neglect since last visito No Secondary Verification Process Completed: Yes Hospitalized since last visit: No Implantable device outside of the clinic excluding No cellular tissue based products placed in the center since last visit: Has Dressing in Place as Prescribed: Yes Pain Present Now: No Electronic Signature(s) Signed: 06/26/2018 1:31:57 PM By: Lorine Bears RCP, RRT, CHT Entered By: Lorine Bears on 06/26/2018 10:34:52 Thomas Mcguire ([id number]) -------------------------------------------------------------------------------- Clinic Level of Care Assessment Details Patient Name: Thomas Mcguire, Thomas Mcguire. Date of Service: 06/26/2018 10:30 AM Medical Record Number: [id number] Patient Account Number: [id number] Date of Birth/Sex: 1940/07/07 (78 y.o. M) Treating RN: Harold Barban Primary Care Briannon Boggio: Leanna Battles Other Clinician: Referring Lajada Janes: Leanna Battles Treating Penn Grissett/Extender: Melburn Hake, HOYT Weeks in Treatment: 1 Clinic Level of Care Assessment Items TOOL 4 Quantity Score []  - Use when only an EandM  is performed on FOLLOW-UP visit 0 ASSESSMENTS - Nursing Assessment / Reassessment X - Reassessment of Co-morbidities (includes updates in patient status) 1 10 X- 1 5 Reassessment of Adherence to Treatment Plan ASSESSMENTS - Wound and Skin Assessment / Reassessment X - Simple Wound Assessment / Reassessment - one wound 1 5 []  - 0 Complex Wound Assessment / Reassessment - multiple wounds []  - 0 Dermatologic / Skin Assessment (not related to wound area) ASSESSMENTS - Focused Assessment []  - Circumferential Edema Measurements - multi extremities 0 []  - 0 Nutritional Assessment / Counseling / Intervention []  - 0 Lower Extremity Assessment (monofilament, tuning fork, pulses) []  - 0 Peripheral Arterial Disease Assessment (using hand held doppler) ASSESSMENTS - Ostomy and/or Continence Assessment and Care []  - Incontinence Assessment and Management 0 []  - 0 Ostomy Care Assessment and Management (repouching, etc.) PROCESS - Coordination of Care X - Simple Patient / Family Education for ongoing care 1 15 []  - 0 Complex (extensive) Patient / Family Education for ongoing care []  - 0 Staff obtains Programmer, systems, Records, Test Results / Process Orders []  - 0 Staff telephones HHA, Nursing Homes / Clarify orders / etc []  - 0 Routine Transfer to another Facility (non-emergent condition) []  - 0 Routine Hospital Admission (non-emergent condition) []  - 0 New Admissions / Biomedical engineer / Ordering NPWT, Apligraf, etc. []  - 0 Emergency Hospital Admission (emergent condition) X- 1 10 Simple Discharge Coordination Thomas Mcguire, Thomas Mcguire ([id number]) []  - 0 Complex (extensive) Discharge Coordination PROCESS - Special Needs []  - Pediatric / Minor Patient Management 0 []  - 0 Isolation Patient Management []  - 0 Hearing / Language / Visual special needs []  - 0 Assessment of Community assistance (transportation, D/C planning, etc.) []  - 0 Additional assistance / Altered mentation []  -  0 Support Surface(s) Assessment (bed, cushion, seat, etc.) INTERVENTIONS - Wound Cleansing / Measurement X - Simple Wound Cleansing - one wound 1 5 []  - 0 Complex Wound Cleansing - multiple wounds  X- 1 5 Wound Imaging (photographs - any number of wounds) []  - 0 Wound Tracing (instead of photographs) X- 1 5 Simple Wound Measurement - one wound []  - 0 Complex Wound Measurement - multiple wounds INTERVENTIONS - Wound Dressings X - Small Wound Dressing one or multiple wounds 1 10 []  - 0 Medium Wound Dressing one or multiple wounds []  - 0 Large Wound Dressing one or multiple wounds []  - 0 Application of Medications - topical []  - 0 Application of Medications - injection INTERVENTIONS - Miscellaneous []  - External ear exam 0 []  - 0 Specimen Collection (cultures, biopsies, blood, body fluids, etc.) []  - 0 Specimen(s) / Culture(s) sent or taken to Lab for analysis []  - 0 Patient Transfer (multiple staff / Civil Service fast streamer / Similar devices) []  - 0 Simple Staple / Suture removal (25 or less) []  - 0 Complex Staple / Suture removal (26 or more) []  - 0 Hypo / Hyperglycemic Management (close monitor of Blood Glucose) []  - 0 Ankle / Brachial Index (ABI) - do not check if billed separately X- 1 5 Vital Signs Thomas Mcguire, Thomas Mcguire ([id number]) Has the patient been seen at the hospital within the last three years: Yes Total Score: 75 Level Of Care: New/Established - Level 2 Electronic Signature(s) Signed: 06/26/2018 3:14:44 PM By: Harold Barban Entered By: Harold Barban on 06/26/2018 11:05:43 Thomas Mcguire ([id number]) -------------------------------------------------------------------------------- Encounter Discharge Information Details Patient Name: Thomas Mcguire, Thomas Mcguire. Date of Service: 06/26/2018 10:30 AM Medical Record Number: [id number] Patient Account Number: [id number] Date of Birth/Sex: Dec 11, 1940 (78 y.o. M) Treating RN: Harold Barban Primary Care Alfonsa Vaile: Leanna Battles Other Clinician: Referring Loralei Radcliffe: Leanna Battles Treating Emberlin Verner/Extender: Melburn Hake, HOYT Weeks in Treatment: 1 Encounter Discharge Information Items Discharge Condition: Stable Ambulatory Status: Ambulatory Discharge Destination: Home Transportation: Private Auto Accompanied By: wife Schedule Follow-up Appointment: Yes Clinical Summary of Care: Electronic Signature(s) Signed: 06/26/2018 3:14:44 PM By: Harold Barban Entered By: Harold Barban on 06/26/2018 11:09:49 Thomas Mcguire ([id number]) -------------------------------------------------------------------------------- Lower Extremity Assessment Details Patient Name: Thomas Mcguire, Thomas Mcguire. Date of Service: 06/26/2018 10:30 AM Medical Record Number: [id number] Patient Account Number: [id number] Date of Birth/Sex: November 11, 1940 (78 y.o. M) Treating RN: Cornell Barman Primary Care Osborn Pullin: Leanna Battles Other Clinician: Referring Gamble Enderle: Leanna Battles Treating Carrah Eppolito/Extender: Melburn Hake, HOYT Weeks in Treatment: 1 Edema Assessment Assessed: [Left: No] [Right: Yes] Edema: [Left: N] [Right: o] Vascular Assessment Pulses: Dorsalis Pedis Palpable: [Right:No] Doppler Audible: [Right:Inaudible] Posterior Tibial Palpable: [Right:No Inaudible] Electronic Signature(s) Signed: 06/26/2018 4:30:53 PM By: Gretta Cool, BSN, RN, CWS, Kim RN, BSN Entered By: Gretta Cool, BSN, RN, CWS, Kim on 06/26/2018 10:46:46 Thomas Mcguire ([id number]) -------------------------------------------------------------------------------- Multi Wound Chart Details Patient Name: Thomas Mcguire, Thomas Mcguire. Date of Service: 06/26/2018 10:30 AM Medical Record Number: [id number] Patient Account Number: [id number] Date of Birth/Sex: Apr 30, 1940 (78 y.o. M) Treating RN: Harold Barban Primary Care Makenzi Bannister: Leanna Battles Other Clinician: Referring Jakera Beaupre: Leanna Battles Treating Anay Walter/Extender: STONE III, HOYT Weeks in Treatment: 1 Vital  Signs Height(in): 77 Pulse(bpm): 61 Weight(lbs): 205 Blood Pressure(mmHg): 155/49 Body Mass Index(BMI): 24 Temperature(F): 98.4 Respiratory Rate 16 (breaths/min): Photos: [N/A:N/A] Wound Location: Right Toe Great - Lateral N/A N/A Wounding Event: Blister N/A N/A Primary Etiology: Diabetic Wound/Ulcer of the N/A N/A Lower Extremity Comorbid History: Glaucoma, Lymphedema, N/A N/A Hypertension, Type II Diabetes, Gout Date Acquired: 06/13/2018 N/A N/A Weeks of Treatment: 1 N/A N/A Wound Status: Open N/A N/A Measurements L x W x D 1.6x2x0.1 N/A N/A (cm) Area (cm) : 2.513 N/A N/A Volume (cm) : 0.251 N/A  N/A % Reduction in Area: 28.90% N/A N/A % Reduction in Volume: 28.90% N/A N/A Classification: Grade 1 N/A N/A Exudate Amount: Medium N/A N/A Exudate Type: Serosanguineous N/A N/A Exudate Color: red, brown N/A N/A Wound Margin: Flat and Intact N/A N/A Granulation Amount: None Present (0%) N/A N/A Necrotic Amount: Large (67-100%) N/A N/A Necrotic Tissue: Eschar N/A N/A Exposed Structures: Fat Layer (Subcutaneous N/A N/A Tissue) Exposed: Yes Fascia: No Tendon: No Muscle: No Thomas Mcguire, Thomas Mcguire ([id number]) Joint: No Bone: No Epithelialization: None N/A N/A Treatment Notes Electronic Signature(s) Signed: 06/26/2018 3:14:44 PM By: Harold Barban Entered By: Harold Barban on 06/26/2018 11:03:52 Thomas Mcguire ([id number]) -------------------------------------------------------------------------------- Multi-Disciplinary Care Plan Details Patient Name: Thomas Mcguire, Thomas Mcguire. Date of Service: 06/26/2018 10:30 AM Medical Record Number: [id number] Patient Account Number: [id number] Date of Birth/Sex: Jun 02, 1940 (78 y.o. M) Treating RN: Harold Barban Primary Care Maddalynn Barnard: Leanna Battles Other Clinician: Referring Sun Kihn: Leanna Battles Treating Hovanes Hymas/Extender: Melburn Hake, HOYT Weeks in Treatment: 1 Active Inactive Electronic Signature(s) Signed: 07/12/2018  6:05:03 PM By: Gretta Cool, BSN, RN, CWS, Kim RN, BSN Signed: 09/08/2018 1:06:11 PM By: Harold Barban Previous Signature: 06/26/2018 3:14:44 PM Version By: Harold Barban Entered By: Gretta Cool BSN, RN, CWS, Kim on 07/12/2018 18:05:02 Thomas Mcguire, Thomas Mcguire ([id number]) -------------------------------------------------------------------------------- Pain Assessment Details Patient Name: Thomas Mcguire, HEFF[place]. Date of Service: 06/26/2018 10:30 AM Medical Record Number: [id number] Patient Account Number: [id number] Date of Birth/Sex: 12-11-1940 (78 y.o. M) Treating RN: Harold Barban Primary Care Kinisha Soper: Leanna Battles Other Clinician: Referring Jaterrius Ricketson: Leanna Battles Treating Rhodie Cienfuegos/Extender: Melburn Hake, HOYT Weeks in Treatment: 1 Active Problems Location of Pain Severity and Description of Pain Patient Has Paino No Site Locations Pain Management and Medication Current Pain Management: Electronic Signature(s) Signed: 06/26/2018 1:31:57 PM By: Lorine Bears RCP, RRT, CHT Signed: 06/26/2018 3:14:44 PM By: Harold Barban Entered By: Lorine Bears on 06/26/2018 10:35:08 Thomas Mcguire ([id number]) -------------------------------------------------------------------------------- Patient/Caregiver Education Details Patient Name: LUCIAN, BASWELL. Date of Service: 06/26/2018 10:30 AM Medical Record Number: [id number] Patient Account Number: [id number] Date of Birth/Gender: 12-09-1940 (78 y.o. M) Treating RN: Harold Barban Primary Care Physician: Leanna Battles Other Clinician: Referring Physician: Leanna Battles Treating Physician/Extender: Sharalyn Ink in Treatment: 1 Education Assessment Education Provided To: Patient Education Topics Provided Wound/Skin Impairment: Handouts: Caring for Your Ulcer Methods: Demonstration, Explain/Verbal Responses: State content correctly Electronic Signature(s) Signed: 06/26/2018 3:14:44 PM By: Harold Barban Entered By: Harold Barban on 06/26/2018 11:04:09 Thomas Mcguire ([id number]) -------------------------------------------------------------------------------- Wound Assessment Details Patient Name: TAHMIR, KLECKNER. Date of Service: 06/26/2018 10:30 AM Medical Record Number: [id number] Patient Account Number: [id number] Date of Birth/Sex: 08-31-1940 (78 y.o. M) Treating RN: Cornell Barman Primary Care Leniya Breit: Leanna Battles Other Clinician: Referring Bitania Shankland: Leanna Battles Treating Dmetrius Ambs/Extender: Melburn Hake, HOYT Weeks in Treatment: 1 Wound Status Wound Number: 3 Primary Diabetic Wound/Ulcer of the Lower Extremity Etiology: Wound Location: Right Toe Great - Lateral Wound Status: Open Wounding Event: Blister Comorbid Glaucoma, Lymphedema, Hypertension, Type Date Acquired: 06/13/2018 History: II Diabetes, Gout Weeks Of Treatment: 1 Clustered Wound: No Photos Wound Measurements Length: (cm) 1.6 % Reduction i Width: (cm) 2 % Reduction i Depth: (cm) 0.1 Epithelializa Area: (cm) 2.513 Tunneling: Volume: (cm) 0.251 Undermining: n Area: 28.9% n Volume: 28.9% tion: None No No Wound Description Classification: Grade 1 Foul Odor Aft Wound Margin: Flat and Intact Slough/Fibrin Exudate Amount: Medium Exudate Type: Serosanguineous Exudate Color: red, brown er Cleansing: No o No Wound Bed Granulation Amount: None Present (0%) Exposed Structure Necrotic Amount: Large (67-100%) Fascia Exposed:  No Necrotic Quality: Eschar Fat Layer (Subcutaneous Tissue) Exposed: Yes Tendon Exposed: No Muscle Exposed: No Joint Exposed: No Bone Exposed: No Electronic Signature(s) DESHONE, LYSSY ([id number]) Signed: 06/26/2018 4:30:53 PM By: Gretta Cool, BSN, RN, CWS, Kim RN, BSN Entered By: Gretta Cool, BSN, RN, CWS, Kim on 06/26/2018 10:40:22 ERRIC, MACHNIK ([id number]) -------------------------------------------------------------------------------- [place] Details Patient  Name: ANDRAS, GRUNEWALD. Date of Service: 06/26/2018 10:30 AM Medical Record Number: [id number] Patient Account Number: [id number] Date of Birth/Sex: [birth year] (78 y.o. M) Treating RN: Harold Barban Primary Care Jora Galluzzo: Leanna Battles Other Clinician: Referring Lue [place]: Leanna Battles Treating Codi Folkerts/Extender: STONE III, HOYT Weeks in Treatment: 1 Vital Signs Time Taken: 10:35 Temperature (F): 98.4 Height (in): 77 Pulse (bpm): 61 Weight (lbs): 205 Respiratory Rate (breaths/min): 16 Body Mass Index (BMI): 24.3 Blood Pressure (mmHg): 155/49 Reference Range: 80 - 120 mg / dl Electronic Signature(s) Signed: 06/26/2018 1:31:57 PM By: Lorine Bears RCP, RRT, CHT Entered By: Lorine Bears on 06/26/2018 10:37:50

## 2018-06-28 NOTE — H&P (View-Only) (Signed)
REASON FOR CONSULT:    Black toe.  The consult is requested by Dr. Carolin Sicks.  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  He has a barely audible dorsalis pedis and posterior tibial signal with the Doppler on the right.  This is clearly a limb threatening situation.  I recommend that we proceed with arteriography to assess any options for revascularization.  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  If he has disease amenable to angioplasty this would potentially be done at the same time.  I would not be able to do his arteriogram until 07/07/18 and therefore I have scheduled it with Dr. Donzetta Matters this coming Monday.  He does have some mild cellulitis and is on po Bactrim.  I have reviewed with the patient the indications for arteriography. In addition, I have reviewed the potential complications of arteriography including but not limited to: Bleeding, arterial injury, arterial thrombosis, dye action, renal insufficiency, or other unpredictable medical problems. I have explained to the patient that if we find disease amenable to angioplasty we could potentially address this at the same time. I have discussed the potential complications of angioplasty and stenting, including but not limited to: Bleeding, arterial thrombosis, arterial injury, dissection, or the need for surgical intervention.  STAGE V CHRONIC KIDNEY DISEASE: This patient has a right radiocephalic fistula.  The fistula does have a weak thrill and an audible bruit.  He is only 1 month postop.  He is scheduled for a follow-up duplex in the next month.  BILATERAL CAROTID BRUITS: I did detect soft carotid bruits.  When he comes in for his office visit after his  arteriogram we will obtain a carotid duplex scan.  I have discussed this with him.  Deitra Mayo, MD, FACS Beeper [phone] Office: [phone]   HPI:   Thomas Mcguire is a pleasant 78 y.o. male, who is seen as a new patient today with a black toe.  He states that he developed a blister on his right second toe 2 weeks ago which progressed to where he now has a black eschar over the medial aspect of his right great toe.  I do not get any history of trauma to the toe.  He does not describe symptoms consistent with claudication although I think his activity is very limited.  He denies rest pain.  His risk factors for peripheral vascular disease include diabetes and hypertension.  According to the records, he also has hyperlipidemia.  He denies any family history of premature cardiovascular disease.  He quit smoking in 1998.  He denies any recent chills or fever.  This patient has stage V chronic kidney disease.  He had a right radiocephalic fistula placed on 05/23/2018.  He is not yet on dialysis.  I did review his most recent note from the cancer center.  The patient has a history of chronic lymphocytic leukemia.  He denies any history of stroke, TIAs, expressive or receptive aphasia, or amaurosis fugax.  Past Medical History:  Diagnosis Date  . Anemia    low iron  . Arthritis   . Chronic renal insufficiency    CHECKED Q3MOS PER PT. Stage 4 as of 05/22/2018 per pt.  . Diabetes mellitus   .  Gout    Has not had recently- 08/24/11  . Hyperlipidemia   . Hypertension   . Neuromuscular disorder ([place])    Neuropathy    Family History  Problem Relation Age of Onset  . Colon cancer Neg Hx     SOCIAL HISTORY: Social History   Socioeconomic History  . Marital status: Married    Spouse name: [name]  . Number of children: Not on file  . Years of education: Not on file  . Highest education level: Not on file  Occupational History  . Not on file  Social Needs  . Financial  resource strain: Not on file  . Food insecurity    Worry: Not on file    Inability: Not on file  . Transportation needs    Medical: Not on file    Non-medical: Not on file  Tobacco Use  . Smoking status: Former Smoker    Quit date: 03/11/1996    Years since quitting: 22.3  . Smokeless tobacco: Never Used  Substance and Sexual Activity  . Alcohol use: No  . Drug use: No  . Sexual activity: Not Currently  Lifestyle  . Physical activity    Days per week: Not on file    Minutes per session: Not on file  . Stress: Not on file  Relationships  . Social Herbalist on phone: Not on file    Gets together: Not on file    Attends religious service: Not on file    Active member of club or organization: Not on file    Attends meetings of clubs or organizations: Not on file    Relationship status: Not on file  . Intimate partner violence    Fear of current or ex partner: Not on file    Emotionally abused: Not on file    Physically abused: Not on file    Forced sexual activity: Not on file  Other Topics Concern  . Not on file  Social History Narrative  . Not on file    No Known Allergies  Current Outpatient Medications  Medication Sig Dispense Refill  . allopurinol (ZYLOPRIM) 300 MG tablet Take 300 mg by mouth daily.      Marland Kitchen amLODipine (NORVASC) 5 MG tablet Take 5 mg by mouth daily.     Marland Kitchen aspirin 81 MG tablet Take 81 mg by mouth daily.    Marland Kitchen BACTRIM DS 800-160 MG tablet Take 1 tablet by mouth 2 (two) times a day.    . calcitRIOL (ROCALTROL) 0.25 MCG capsule Take 0.25 mcg by mouth daily.   5  . carvedilol (COREG) 25 MG tablet Take 12.5 mg by mouth 2 (two) times daily with a meal.     . COD LIVER OIL PO Take 1 capsule by mouth 2 (two) times daily.     . cyanocobalamin (,VITAMIN B-12,) 1000 MCG/ML injection Vitamin B12 injection: 1000 mcg (1 mL) by subcu injection once daily for 7 days; then once weekly for 4 weeks; then once monthly thereafter.  7 07/15/2010 (Patient taking  differently: Inject 1,000 mcg into the muscle every 30 (thirty) days. ) 12 mL 1  . doxazosin (CARDURA) 2 MG tablet Take 2 mg by mouth every morning.     . ferrous sulfate 325 (65 FE) MG tablet Take 325 mg by mouth 2 (two) times daily with a meal.     . folic acid (FOLVITE) 1 MG tablet Take 1 tablet (1 mg total) by mouth daily. 90 tablet  0  . furosemide (LASIX) 40 MG tablet Take 40 mg by mouth 2 (two) times daily.     Marland Kitchen HYDROcodone-acetaminophen (NORCO/VICODIN) 5-325 MG tablet Take 1 tablet by mouth every 6 (six) hours as needed (pain.). (Patient not taking: Reported on 06/22/2018) 10 tablet 0  . insulin NPH (HUMULIN N,NOVOLIN N) 100 UNIT/ML injection Inject 50 Units into the skin 2 (two) times daily.     . insulin regular (HUMULIN R,NOVOLIN R) 100 units/mL injection Inject 22-25 Units into the skin 3 (three) times daily before meals. Sliding Scale    . isosorbide mononitrate (IMDUR) 30 MG 24 hr tablet Take 30 mg by mouth daily.  6  . potassium chloride (K-DUR) 10 MEQ tablet Take 10 mEq by mouth daily.  3  . pravastatin (PRAVACHOL) 40 MG tablet Take 40 mg by mouth at bedtime.      No current facility-administered medications for this visit.     REVIEW OF SYSTEMS:  [X]  denotes positive finding, [ ]  denotes negative finding Cardiac  Comments:  Chest pain or chest pressure:    Shortness of breath upon exertion: x   Short of breath when lying flat:    Irregular heart rhythm:        Vascular    Pain in calf, thigh, or hip brought on by ambulation:    Pain in feet at night that wakes you up from your sleep:     Blood clot in your veins:    Leg swelling:         Pulmonary    Oxygen at home:    Productive cough:     Wheezing:         Neurologic    Sudden weakness in arms or legs:     Sudden numbness in arms or legs:     Sudden onset of difficulty speaking or slurred speech:    Temporary loss of vision in one eye:     Problems with dizziness:         Gastrointestinal    Blood in stool:      Vomited blood:         Genitourinary    Burning when urinating:     Blood in urine:        Psychiatric    Major depression:         Hematologic    Bleeding problems:    Problems with blood clotting too easily:        Skin    Rashes or ulcers:        Constitutional    Fever or chills:     PHYSICAL EXAM:   Vitals:   06/28/18 1116  BP: 135/65  Pulse: (!) 59  Resp: 20  Temp: (!) 97.2 F (36.2 C)  SpO2: 100%  Weight: 210 lb (95.3 kg)  Height: 6\' 6"  (1.981 m)    GENERAL: The patient is a well-nourished male, in no acute distress. The vital signs are documented above. CARDIAC: There is a regular rate and rhythm.  VASCULAR: He has soft bilateral carotid bruits. On the right side he has a palpable femoral pulse.  I cannot palpate a popliteal or pedal pulses.  He has a dampened monophasic posterior tibial and dorsalis pedis signal on the right with the Doppler. On the left side he has a palpable femoral pulse.  I cannot palpate a popliteal pulse although he does have his jeans on.  I cannot palpate pedal pulses.  He does have a monophasic  dorsalis pedis and posterior tibial signal on the left.  Doppler signals on the left are much brisker. He has no significant lower extremity swelling. PULMONARY: There is good air exchange bilaterally without wheezing or rales. ABDOMEN: Soft and non-tender with normal pitched bowel sounds.  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    NEUROLOGIC: No focal weakness or paresthesias are detected. SKIN: He has dry gangrene of the right great toe as documented below.  There is some mild cellulitis associated with this.    PSYCHIATRIC: The patient has a normal affect.  DATA:    ARTERIAL DOPPLER STUDY: Most recent arterial Doppler study that I can find was in December 2018.  At that time, on the right side he had a monophasic dorsalis pedis and posterior tibial signal with an ABI of 99% although this was  likely falsely elevated.  Toe pressure was 47 mmHg.  On the left side he had a monophasic dorsalis pedis and posterior tibial signal.  ABI was 68%.  Toe pressure could not be obtained.  His arteries were noted to be calcified and noncompressible.  LABS: I reviewed his labs from 06/22/2018.  He had a white blood cell count of 24,000.  Hemoglobin 12.4.  Platelets 145,000.  His GFR on 12/28/2017 was 10.

## 2018-06-29 ENCOUNTER — Other Ambulatory Visit
Admission: RE | Admit: 2018-06-29 | Discharge: 2018-06-29 | Disposition: A | Discharge status: Home / Self Care | Payer: Medicare Other | Source: Ambulatory Visit | Attending: Vascular Surgery | Admitting: Vascular Surgery

## 2018-06-29 DIAGNOSIS — Z1159 Encounter for screening for other viral diseases: Secondary | ICD-10-CM | POA: Insufficient documentation

## 2018-06-29 NOTE — Progress Notes (Signed)
OSEIAS, HORSEY ([id number]) Visit Report for 06/26/2018 Chief Complaint Document Details Patient Name: Thomas Mcguire, Thomas Mcguire. Date of Service: 06/26/2018 10:30 AM Medical Record Number: [id number] Patient Account Number: [id number] Date of Birth/Sex: [birth year] (78 y.o. M) Treating RN: Harold Barban Primary Care Provider: Leanna Battles Other Clinician: Referring Provider: Leanna Battles Treating Provider/Extender: Melburn Hake, HOYT Weeks in Treatment: 1 Information Obtained from: Patient Chief Complaint Right great toe ulcer Electronic Signature(s) Signed: 06/28/2018 6:29:07 AM By: Worthy Keeler PA-C Entered By: Worthy Keeler on 06/26/2018 11:11:07 [name] ([id number]) -------------------------------------------------------------------------------- HPI Details Patient Name: MANNING, LUNA. Date of Service: 06/26/2018 10:30 AM Medical Record Number: [id number] Patient Account Number: [id number] Date of Birth/Sex: 1940/07/20 (78 y.o. 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 He is here for review of wound on the right lower leg. 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 He has chronic edema in his lower legs and uses 20-30 mm compression stockings which she is compliant with. He is a nonsmoker. Beside his diabetes he has hypertension and hypercholesterolemia and in 2013 he had stage III chronic renal failure. He tells me his primary physician Dr. Valetta Fuller at [facility] follows him every 3 months for his kidney failure. He also states he saw a nephrologist once. ABIs in this clinic were  noncompressible. He tells me he saw Dr. Doren Custard in 6 or 7 years ago at [facility] in [place]. He does not remember what the issue was. It would appear that the patient has had recurrent blistering in the lower extremities as there are several healed areas on the left anterior and right anterior calves 12/14/16 on evaluation today patient appears to be doing very well with the current three layer compression wraps. His right lower extremity is significantly improved as far as the swelling is concerned. He has been tolerating the wraps without any complication. The wounds also appear to be doing better compared to last week's evaluation. Unfortunately he does have a new dramatic wound to the left interior shin but otherwise things are looking up. No fevers chills noted. Patient does have a appointment with [facility] on December 23, 2016 four arterial studies. 12/22/16 area x2 on right leg are closed. Still one open area on left leg. arterial studies tomorrow 12/28/16; 2 wounds on the right leg are closed. He still has one open area on the left leg. ARTERIAL studies were done on 12/23/16 D showed an ABI on the right of 0.99 and on the left at 0.68. His toe brachial index was 0.26 on the right. Monophasic waveforms at both the posterior tibial and dorsalis pedis arteries. The patient saw Dr. Trula Slade of vascular surgery yesterday. He stated he would follow him for the remaining wound on the left leg. He has been using silver alginate 01/12/17; the area on the left leg is closed. His original wounds on the right leg are closed as well. He has 20-30 mm compression stockings. He is following with Dr. Lita Mains ham for his arterial issues however his wounds are closed and he is tolerating 20-30 mm stockings Readmission: 06/19/18 patient presents today for readmission into our clinic concerning issues having with his  right great toe which unfortunately started as a blister just the  past week. He notes is not having any pain on that he does have neuropathy this is likely the big reason that he is not experiencing any discomfort in particular. Fortunately there's no signs of active infection at this time. No fevers, chills, nausea, or vomiting noted at this time. Patient's wound bed currently showed signs of necrotic tissue on the surface of the planned where he does have some blistering skin at the site where he has a callous. Fortunately there's no signs of systemic infection although he does have a red streak that is extending up from the great toe I'm unsure if this is related directly to an active infection at this time with this is something that should normally there the patient is unsure. He has previously undergone vascular testing but I'm unsure as to whether or not this has been repeated in since we last saw him. Fortunately he does still see [facility] in [place] and it appears they may have an arterial study ordered for him will be looking epic for later this month although we are gonna call and confirm this to be certain. I think that is something that we really do need to do. 06/26/18 on evaluation today patient actually appears to be doing poorly in the doses toe ulcer. Unfortunately this seems to be a dry SR/skin cream type situation that is developing. I am concerned. He has an appointment with vascular on the 25th of this month but I believe that this may need to be moved up. We will contact the office and try to get this moved up sooner. No fevers, chills, nausea, or vomiting noted at this time. YEHUDAH, STANDING ([id number]) Electronic Signature(s) Signed: 06/28/2018 6:29:07 AM By: Worthy Keeler PA-C Entered By: Worthy Keeler on 06/26/2018 23:43:04 [name] ([id number]) -------------------------------------------------------------------------------- Physical Exam Details Patient Name: JULIAN, MEDINA. Date of Service:  06/26/2018 10:30 AM Medical Record Number: [id number] Patient Account Number: [id number] Date of Birth/Sex: June 17, 1940 (78 y.o. M) Treating RN: Harold Barban Primary Care Provider: Leanna Battles Other Clinician: Referring Provider: Leanna Battles Treating Provider/Extender: STONE III, HOYT Weeks in Treatment: 1 Constitutional Well-nourished and well-hydrated in no acute distress. Respiratory normal breathing without difficulty. clear to auscultation bilaterally. Cardiovascular regular rate and rhythm with normal S1, S2. Psychiatric this patient is able to make decisions and demonstrates good insight into disease process. Alert and Oriented x 3. pleasant and cooperative. Notes Patient's wound bed actually showed signs of dry eschar at this point although there's no signs of wet being green active infection that is good news. Nonetheless I think that this is a vascular issue that needs to be addressed sooner rather than later. Electronic Signature(s) Signed: 06/28/2018 6:29:07 AM By: Worthy Keeler PA-C Entered By: Worthy Keeler on 06/26/2018 23:43:55 [name] ([id number]) -------------------------------------------------------------------------------- Physician Orders Details Patient Name: AKEEN, LEDYARD. Date of Service: 06/26/2018 10:30 AM Medical Record Number: [id number] Patient Account Number: [id number] Date of Birth/Sex: 1940/12/27 (78 y.o. M) Treating RN: Harold Barban Primary Care Provider: Leanna Battles Other Clinician: Referring Provider: Leanna Battles Treating Provider/Extender: Melburn Hake, HOYT Weeks in Treatment: 1 Verbal / Phone Orders: No Diagnosis Coding Wound Cleansing Wound #3 Right,Lateral Toe Great o Clean wound with Normal Saline. o Dial antibacterial soap, wash wounds, rinse and pat dry prior to dressing wounds Anesthetic (add to Medication List) Wound #3 Right,Lateral Toe Great o Topical Lidocaine 4% cream applied to  wound bed prior to debridement (In Clinic Only). Primary Wound Dressing Wound #3 Right,Lateral Toe Great o Other: - Apply Betadine to wound area daily Secondary Dressing Wound #3 Right,Lateral Toe Great o Conform/Kerlix o Foam Dressing Change Frequency Wound #3 Right,Lateral Toe Great o Change dressing every day. Follow-up Appointments Wound #3 Right,Lateral Toe Great o Return Appointment in 1 week. Electronic Signature(s) Signed: 06/26/2018 3:14:44 PM By: Harold Barban Signed: 06/28/2018 6:29:07 AM By: Worthy Keeler PA-C Entered By: Harold Barban on 06/26/2018 11:08:19 [name] ([id number]) -------------------------------------------------------------------------------- Problem List Details Patient Name: ANTWUAN, ECKLEY. Date of Service: 06/26/2018 10:30 AM Medical Record Number: [id number] Patient Account Number: [id number] Date of Birth/Sex: July 13, 1940 (78 y.o. 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 Date of Service: 06/26/2018 10:30 AM Medical Record Number:  [id number] Patient Account Number: [id number] Date of Birth/Sex: 29-Jul-1940 (78 y.o. 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 He is here for review of wound on the right lower leg. 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 He has chronic edema in his lower legs and uses 20-30 mm compression stockings which she is compliant with. He is a nonsmoker. Beside his diabetes he has hypertension and hypercholesterolemia and in 2013 he had stage III chronic renal failure. He tells me his primary physician Dr. Valetta Fuller at [facility] follows him every 3 months for his kidney failure. He also states he saw a nephrologist once. ABIs in this clinic were noncompressible. He tells me he saw Dr. Doren Custard in 6 or 7 years ago at [facility] in [place]. He does not remember what the issue was. It would appear that the patient has had recurrent blistering in the lower extremities as there are several healed areas on the left anterior and right anterior calves 12/14/16 on evaluation today patient appears to be doing very well with the current three layer compression wraps. His right lower extremity is significantly improved as far as the swelling is concerned. He has been tolerating the wraps without any complication. The wounds also appear to be doing better compared to last week's evaluation. Unfortunately he does have a new dramatic wound to the left interior shin but otherwise things are looking up. No fevers chills noted. Patient does have a appointment with [facility] [place] on December 23, 2016 four arterial studies. 12/22/16 area x2 on right leg are closed. Still one open area on left leg. arterial studies tomorrow 12/28/16; 2 wounds on the  right leg are closed. He still has one open area on the left leg. ARTERIAL studies were done on 12/23/16 D showed an ABI on the right of 0.99 and on the left at 0.68. His toe brachial index was 0.26 on the right. Monophasic waveforms at both the posterior tibial and dorsalis pedis arteries. The patient saw Dr. Trula Slade of vascular surgery yesterday. He stated he would follow him for the remaining wound on the left leg. He has been using silver alginate 01/12/17; the area on the left leg is closed. His original wounds on the right leg are closed as well. He has 20-30 mm compression stockings. He is following with Dr. Lita Mains ham for his arterial issues however his wounds are closed and he is tolerating 20-30 mm stockings Readmission: 06/19/18 patient presents today for readmission into our clinic concerning issues having with his right great toe which unfortunately started as a blister just the past week. He notes is not having any pain on that he does have neuropathy this is likely the big reason that he is not experiencing any discomfort in particular. Fortunately there's no signs of active infection at this time. No fevers, chills, nausea, or vomiting noted at this time. Patient's wound bed currently showed signs of necrotic tissue on the surface of the planned where he does have some blistering skin at the site where he has a callous. Fortunately there's no signs of systemic infection although he does have a red streak that is extending up from the great toe I'm unsure if this is related directly to an active infection at this time with this is something that should normally there the patient is unsure. He has previously undergone vascular testing but I'm unsure as to whether or not this has been repeated in  since we last saw him. Fortunately he does still see [facility] vascular specialist in [place] and it appears they may have an arterial study ordered for him will be looking epic for later this month although we are gonna call and confirm this to be certain. I think that is something that we really do need to do. DONJUAN, ROBISON ([id number]) 06/26/18 on evaluation today patient actually appears to be doing poorly in the doses toe ulcer. Unfortunately this seems to be a dry SR/skin cream type situation that is developing. I am concerned. He has an appointment with vascular on the 25th of this month but I believe that this may need to be moved up. We will contact the office and try to get this moved up sooner. No fevers, chills, nausea, or vomiting noted at this time. Patient History Information obtained from Patient. Family History Diabetes - Mother,Father, Hypertension - Mother, No family history of Cancer, Heart Disease, Kidney Disease, Lung Disease, Seizures, Stroke, Thyroid Problems, Tuberculosis. Social History Former smoker - quit 24 years ago, Marital Status - Married, Alcohol Use - Never, Drug Use - No History, Caffeine Use - Daily. Medical History Eyes Patient has history of Glaucoma Denies history of Cataracts, Optic Neuritis Ear/Nose/Mouth/Throat Denies history of Chronic sinus problems/congestion, Middle ear problems Hematologic/Lymphatic Patient has history of Lymphedema Denies history of Anemia, Hemophilia, Human Immunodeficiency Virus, Sickle Cell Disease Respiratory Denies history of Aspiration, Asthma, Chronic Obstructive Pulmonary Disease (COPD), Pneumothorax, Sleep Apnea, Tuberculosis Cardiovascular Patient has history of Hypertension Denies history of Angina, Arrhythmia, Congestive Heart Failure, Coronary Artery Disease, Deep Vein Thrombosis, Myocardial Infarction, Peripheral Arterial Disease, Peripheral Venous Disease, Phlebitis,  Vasculitis Gastrointestinal Denies history of Cirrhosis , Colitis, Crohn s,  Hepatitis A, Hepatitis B, Hepatitis C Endocrine Patient has history of Type II Diabetes - insulin dependant Genitourinary Denies history of End Stage Renal Disease Immunological Denies history of Lupus Erythematosus, Raynaud s, Scleroderma Integumentary (Skin) Denies history of History of Burn, History of pressure wounds Musculoskeletal Patient has history of Gout Denies history of Rheumatoid Arthritis, Osteoarthritis, Osteomyelitis Neurologic Denies history of Dementia, Neuropathy, Quadriplegia, Paraplegia, Seizure Disorder Oncologic Denies history of Received Chemotherapy, Received Radiation Psychiatric Denies history of Anorexia/bulimia, Confinement Anxiety Hospitalization/Surgery History - shut placement at [facility] 3 weeks ago. Review of Systems (ROS) Constitutional Symptoms (General Health) Denies complaints or symptoms of Fatigue, Fever, Chills, Marked Weight Change. Respiratory SKYLOR, SCHNAPP ([id number]) Denies complaints or symptoms of Chronic or frequent coughs, Shortness of Breath. Cardiovascular Denies complaints or symptoms of Chest pain, LE edema. Psychiatric Denies complaints or symptoms of Anxiety, Claustrophobia. Objective Constitutional Well-nourished and well-hydrated in no acute distress. Vitals Time Taken: 10:35 AM, Height: 77 in, Weight: 205 lbs, BMI: 24.3, Temperature: 98.4 F, Pulse: 61 bpm, Respiratory Rate: 16 breaths/min, Blood Pressure: 155/49 mmHg. Respiratory normal breathing without difficulty. clear to auscultation bilaterally. Cardiovascular regular rate and rhythm with normal S1, S2. Psychiatric this patient is able to make decisions and demonstrates good insight into disease process. Alert and Oriented x 3. pleasant and cooperative. General Notes: Patient's wound bed actually showed signs of dry eschar at this point although there's no signs of wet  being green active infection that is good news. Nonetheless I think that this is a vascular issue that needs to be addressed sooner rather than later. Integumentary (Hair, Skin) Wound #3 status is Open. Original cause of wound was Blister. The wound is located on the [employer] The wound measures 1.6cm length x 2cm width x 0.1cm depth; 2.513cm^2 area and 0.251cm^3 volume. There is Fat Layer (Subcutaneous Tissue) Exposed exposed. There is no tunneling or undermining noted. There is a medium amount of serosanguineous drainage noted. The wound margin is flat and intact. There is no granulation within the wound bed. There is a large (67-100%) amount of necrotic tissue within the wound bed including Eschar. Assessment Active Problems ICD-10 Type 2 diabetes mellitus with other skin ulcer Type 2 diabetes mellitus with diabetic polyneuropathy Venous insufficiency (chronic) (peripheral) Non-pressure chronic ulcer of other part of right foot with fat layer exposed KENITH, TRICKEL ([id number]) Plan Wound Cleansing: Wound #3 Right,Lateral Toe Great: Clean wound with Normal Saline. Dial antibacterial soap, wash wounds, rinse and pat dry prior to dressing wounds Anesthetic (add to Medication List): Wound #3 Right,Lateral Toe Great: Topical Lidocaine 4% cream applied to wound bed prior to debridement (In Clinic Only). Primary Wound Dressing: Wound #3 Right,Lateral Toe Great: Other: - Apply Betadine to wound area daily Secondary Dressing: Wound #3 Right,Lateral Toe Great: Conform/Kerlix Foam Dressing Change Frequency: Wound #3 Right,Lateral Toe Great: Change dressing every day. Follow-up Appointments: Wound #3 Right,Lateral Toe Great: Return Appointment in 1 week. I'm gonna suggest currently that we go ahead and initiate the above wound to measures for the next week and the patient is in agreement with plan. We will subsequently see were things that at follow-up. If anything  changes worsens meantime patient will contact the office let me know. Otherwise I think we need to try to get the vascular appointment moved up there work on that today if you do not hear anything from the office as we had leave a message for me will definitely contact them again tomorrow. Please see above  for specific wound care orders. We will see patient for re-evaluation in 1 week(s) here in the clinic. If anything worsens or changes patient will contact our office for additional recommendations. Electronic Signature(s) Signed: 06/28/2018 6:29:07 AM By: Worthy Keeler PA-C Entered By: Worthy Keeler on 06/26/2018 23:44:06 [name] ([id number]) -------------------------------------------------------------------------------- ROS/PFSH Details Patient Name: BRICYN, LABRADA. Date of Service: 06/26/2018 10:30 AM Medical Record Number: [id number] Patient Account Number: [id number] Date of Birth/Sex: 1940/10/11 (78 y.o. M) Treating RN: Harold Barban Primary Care Provider: Leanna Battles Other Clinician: Referring Provider: Leanna Battles Treating Provider/Extender: Melburn Hake, HOYT Weeks in Treatment: 1 Information Obtained From Patient Constitutional Symptoms (General Health) Complaints and Symptoms: Negative for: Fatigue; Fever; Chills; Marked Weight Change Respiratory Complaints and Symptoms: Negative for: Chronic or frequent coughs; Shortness of Breath Medical History: Negative for: Aspiration; Asthma; Chronic Obstructive Pulmonary Disease (COPD); Pneumothorax; Sleep Apnea; Tuberculosis Cardiovascular Complaints and Symptoms: Negative for: Chest pain; LE edema Medical History: Positive for: Hypertension Negative for: Angina; Arrhythmia; Congestive Heart Failure; Coronary Artery Disease; Deep Vein Thrombosis; Myocardial Infarction; Peripheral Arterial Disease; Peripheral Venous Disease; Phlebitis; Vasculitis Psychiatric Complaints and Symptoms: Negative for:  Anxiety; Claustrophobia Medical History: Negative for: Anorexia/bulimia; Confinement Anxiety Eyes Medical History: Positive for: Glaucoma Negative for: Cataracts; Optic Neuritis Ear/Nose/Mouth/Throat Medical History: Negative for: Chronic sinus problems/congestion; Middle ear problems Hematologic/Lymphatic Medical History: Positive for: Lymphedema CHARLE, CLEAR ([id number]) Negative for: Anemia; Hemophilia; Human Immunodeficiency Virus; Sickle Cell Disease Gastrointestinal Medical History: Negative for: Cirrhosis ; Colitis; Crohnos; Hepatitis A; Hepatitis B; Hepatitis C Endocrine Medical History: Positive for: Type II Diabetes - insulin dependant Time with diabetes: over 20 years Treated with: Insulin Blood sugar tested every day: Yes Tested : 4 to 5 times daily Blood sugar testing results: Breakfast: 120; Bedtime: 140 Genitourinary Medical History: Negative for: End Stage Renal Disease Immunological Medical History: Negative for: Lupus Erythematosus; Raynaudos; Scleroderma Integumentary (Skin) Medical History: Negative for: History of Burn; History of pressure wounds Musculoskeletal Medical History: Positive for: Gout Negative for: Rheumatoid Arthritis; Osteoarthritis; Osteomyelitis Neurologic Medical History: Negative for: Dementia; Neuropathy; Quadriplegia; Paraplegia; Seizure Disorder Oncologic Medical History: Negative for: Received Chemotherapy; Received Radiation HBO Extended History Items Eyes: Glaucoma Immunizations Pneumococcal Vaccine: Received Pneumococcal Vaccination: Yes Implantable Devices FORTINO, HAAG ([id number]) No devices added Hospitalization / Surgery History Type of Hospitalization/Surgery shut placement at [facility] 3 weeks ago Family and Social History Cancer: No; Diabetes: Yes - Mother,Father; Heart Disease: No; Hypertension: Yes - Mother; Kidney Disease: No; Lung Disease: No; Seizures: No; Stroke: No; Thyroid Problems:  No; Tuberculosis: No; Former smoker - quit 24 years ago; Marital Status - Married; Alcohol Use: Never; Drug Use: No History; Caffeine Use: Daily; Financial Concerns: No; Food, Clothing or Shelter Needs: No; Support System Lacking: No; Transportation Concerns: No Physician Affirmation I have reviewed and agree with the above information. Electronic Signature(s) Signed: 06/28/2018 6:29:07 AM By: Worthy Keeler PA-C Signed: 06/28/2018 4:39:36 PM By: Harold Barban Entered By: Worthy Keeler on 06/26/2018 23:43:27 [name] ([id number]) -------------------------------------------------------------------------------- [place] Details Patient Name: BRYAN, GOIN. Date of Service: 06/26/2018 Medical Record Number: [id number] Patient Account Number: [id number] Date of Birth/Sex: 10/02/1940 (78 y.o. M) Treating RN: Harold Barban Primary Care Provider: Leanna Battles Other Clinician: Referring Provider: Leanna Battles Treating Provider/Extender: Melburn Hake, HOYT Weeks in Treatment: 1 Diagnosis Coding ICD-10 Codes Code Description E11.622 Type 2 diabetes mellitus with other skin ulcer E11.42 Type 2 diabetes mellitus with diabetic polyneuropathy I87.2 Venous insufficiency (chronic) (peripheral) L97.512 Non-pressure chronic ulcer  of other part of right foot with fat layer exposed Facility Procedures CPT4 Code: 20990689 Description: [phone] - WOUND CARE VISIT-LEV 2 EST PT Modifier: Quantity: 1 Physician Procedures CPT4 Code: 4033533 Description: 17409 - WC PHYS LEVEL 4 - EST PT ICD-10 Diagnosis Description E11.622 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

## 2018-06-30 LAB — NOVEL CORONAVIRUS, NAA (HOSP ORDER, SEND-OUT TO REF LAB; TAT 18-24 HRS): SARS-CoV-2, NAA: NOT DETECTED

## 2018-07-03 ENCOUNTER — Ambulatory Visit: Payer: Medicare Other | Admitting: Physician Assistant

## 2018-07-03 ENCOUNTER — Encounter (HOSPITAL_COMMUNITY)
Admission: RE | Disposition: A | Discharge status: Home / Self Care | Payer: Self-pay | Source: Home / Self Care | Attending: Vascular Surgery

## 2018-07-03 ENCOUNTER — Encounter (HOSPITAL_COMMUNITY): Payer: Self-pay | Admitting: Vascular Surgery

## 2018-07-03 ENCOUNTER — Ambulatory Visit (HOSPITAL_COMMUNITY)
Admission: RE | Admit: 2018-07-03 | Discharge: 2018-07-03 | Disposition: A | Discharge status: Home / Self Care | Payer: Medicare Other | Attending: Vascular Surgery | Admitting: Vascular Surgery

## 2018-07-03 ENCOUNTER — Other Ambulatory Visit: Payer: Self-pay

## 2018-07-03 ENCOUNTER — Ambulatory Visit (HOSPITAL_BASED_OUTPATIENT_CLINIC_OR_DEPARTMENT_OTHER): Payer: Medicare Other

## 2018-07-03 DIAGNOSIS — Z01810 Encounter for preprocedural cardiovascular examination: Secondary | ICD-10-CM | POA: Diagnosis not present

## 2018-07-03 DIAGNOSIS — N185 Chronic kidney disease, stage 5: Secondary | ICD-10-CM | POA: Insufficient documentation

## 2018-07-03 DIAGNOSIS — E11621 Type 2 diabetes mellitus with foot ulcer: Secondary | ICD-10-CM | POA: Diagnosis not present

## 2018-07-03 DIAGNOSIS — Z7982 Long term (current) use of aspirin: Secondary | ICD-10-CM | POA: Insufficient documentation

## 2018-07-03 DIAGNOSIS — Z794 Long term (current) use of insulin: Secondary | ICD-10-CM | POA: Diagnosis not present

## 2018-07-03 DIAGNOSIS — D649 Anemia, unspecified: Secondary | ICD-10-CM | POA: Diagnosis not present

## 2018-07-03 DIAGNOSIS — E785 Hyperlipidemia, unspecified: Secondary | ICD-10-CM | POA: Insufficient documentation

## 2018-07-03 DIAGNOSIS — Z87891 Personal history of nicotine dependence: Secondary | ICD-10-CM | POA: Diagnosis not present

## 2018-07-03 DIAGNOSIS — Z89422 Acquired absence of other left toe(s): Secondary | ICD-10-CM | POA: Diagnosis not present

## 2018-07-03 DIAGNOSIS — E1122 Type 2 diabetes mellitus with diabetic chronic kidney disease: Secondary | ICD-10-CM | POA: Diagnosis not present

## 2018-07-03 DIAGNOSIS — I120 Hypertensive chronic kidney disease with stage 5 chronic kidney disease or end stage renal disease: Secondary | ICD-10-CM | POA: Diagnosis not present

## 2018-07-03 DIAGNOSIS — I70235 Atherosclerosis of native arteries of right leg with ulceration of other part of foot: Secondary | ICD-10-CM

## 2018-07-03 DIAGNOSIS — L97519 Non-pressure chronic ulcer of other part of right foot with unspecified severity: Secondary | ICD-10-CM | POA: Insufficient documentation

## 2018-07-03 DIAGNOSIS — M1990 Unspecified osteoarthritis, unspecified site: Secondary | ICD-10-CM | POA: Insufficient documentation

## 2018-07-03 DIAGNOSIS — M109 Gout, unspecified: Secondary | ICD-10-CM | POA: Insufficient documentation

## 2018-07-03 DIAGNOSIS — Z79899 Other long term (current) drug therapy: Secondary | ICD-10-CM | POA: Insufficient documentation

## 2018-07-03 DIAGNOSIS — I70261 Atherosclerosis of native arteries of extremities with gangrene, right leg: Secondary | ICD-10-CM | POA: Diagnosis not present

## 2018-07-03 DIAGNOSIS — E1140 Type 2 diabetes mellitus with diabetic neuropathy, unspecified: Secondary | ICD-10-CM | POA: Diagnosis not present

## 2018-07-03 HISTORY — PX: ABDOMINAL AORTOGRAM W/LOWER EXTREMITY: CATH118223

## 2018-07-03 LAB — POCT I-STAT, CHEM 8
BUN: 60 mg/dL — ABNORMAL HIGH (ref 8–23)
Calcium, Ion: 1.18 mmol/L (ref 1.15–1.40)
Chloride: 103 mmol/L (ref 98–111)
Creatinine, Ser: 6.3 mg/dL — ABNORMAL HIGH (ref 0.61–1.24)
Glucose, Bld: 107 mg/dL — ABNORMAL HIGH (ref 70–99)
HCT: 36 % — ABNORMAL LOW (ref 39.0–52.0)
Hemoglobin: 12.2 g/dL — ABNORMAL LOW (ref 13.0–17.0)
Potassium: 4.1 mmol/L (ref 3.5–5.1)
Sodium: 136 mmol/L (ref 135–145)
TCO2: 23 mmol/L (ref 22–32)

## 2018-07-03 LAB — GLUCOSE, CAPILLARY
Glucose-Capillary: 102 mg/dL — ABNORMAL HIGH (ref 70–99)
Glucose-Capillary: 98 mg/dL (ref 70–99)

## 2018-07-03 SURGERY — ABDOMINAL AORTOGRAM W/LOWER EXTREMITY
Anesthesia: LOCAL | Laterality: Right

## 2018-07-03 MED ORDER — SODIUM CHLORIDE 0.9 % IV SOLN: 250.0000 mL | INTRAVENOUS | Status: DC | PRN

## 2018-07-03 MED ORDER — MIDAZOLAM HCL 2 MG/2ML IJ SOLN
INTRAMUSCULAR | Status: AC
  Filled 2018-07-03: qty 2

## 2018-07-03 MED ORDER — SODIUM CHLORIDE 0.9 % IV SOLN: INTRAVENOUS | Status: DC

## 2018-07-03 MED ORDER — MORPHINE SULFATE (PF) 10 MG/ML IV SOLN: 2.0000 mg | INTRAVENOUS | Status: DC | PRN

## 2018-07-03 MED ORDER — FENTANYL CITRATE (PF) 100 MCG/2ML IJ SOLN
INTRAMUSCULAR | Status: AC
  Filled 2018-07-03: qty 2

## 2018-07-03 MED ORDER — SODIUM CHLORIDE 0.9% FLUSH: 3.0000 mL | INTRAVENOUS | Status: DC | PRN

## 2018-07-03 MED ORDER — LIDOCAINE HCL (PF) 1 % IJ SOLN
INTRAMUSCULAR | Status: AC
  Filled 2018-07-03: qty 30

## 2018-07-03 MED ORDER — HEPARIN (PORCINE) IN NACL 1000-0.9 UT/500ML-% IV SOLN
INTRAVENOUS | Status: DC | PRN
  Administered 2018-07-03 (×2): 500 mL

## 2018-07-03 MED ORDER — LIDOCAINE HCL (PF) 1 % IJ SOLN
INTRAMUSCULAR | Status: DC | PRN
  Administered 2018-07-03: 10 mL

## 2018-07-03 MED ORDER — FENTANYL CITRATE (PF) 100 MCG/2ML IJ SOLN
INTRAMUSCULAR | Status: DC | PRN
  Administered 2018-07-03: 25 ug via INTRAVENOUS

## 2018-07-03 MED ORDER — HEPARIN (PORCINE) IN NACL 1000-0.9 UT/500ML-% IV SOLN
INTRAVENOUS | Status: AC
  Filled 2018-07-03: qty 1000

## 2018-07-03 MED ORDER — SODIUM CHLORIDE 0.9% FLUSH: 3.0000 mL | Freq: Two times a day (BID) | INTRAVENOUS | Status: DC

## 2018-07-03 MED ORDER — OXYCODONE HCL 5 MG PO TABS: 5.0000 mg | ORAL_TABLET | ORAL | Status: DC | PRN

## 2018-07-03 MED ORDER — LABETALOL HCL 5 MG/ML IV SOLN: 10.0000 mg | INTRAVENOUS | Status: DC | PRN

## 2018-07-03 MED ORDER — MIDAZOLAM HCL 2 MG/2ML IJ SOLN
INTRAMUSCULAR | Status: DC | PRN
  Administered 2018-07-03: 1 mg via INTRAVENOUS

## 2018-07-03 MED ORDER — ONDANSETRON HCL 4 MG/2ML IJ SOLN: 4.0000 mg | Freq: Four times a day (QID) | INTRAMUSCULAR | Status: DC | PRN

## 2018-07-03 MED ORDER — ACETAMINOPHEN 325 MG PO TABS: 650.0000 mg | ORAL_TABLET | ORAL | Status: DC | PRN

## 2018-07-03 MED ORDER — HYDRALAZINE HCL 20 MG/ML IJ SOLN: 5.0000 mg | INTRAMUSCULAR | Status: DC | PRN

## 2018-07-03 SURGICAL SUPPLY — 13 items
CATH OMNI FLUSH 5F 65CM (CATHETERS) ×1 IMPLANT
CLOSURE MYNX CONTROL 5F (Vascular Products) ×1 IMPLANT
FILTER CO2 0.2 MICRON (VASCULAR PRODUCTS) ×1 IMPLANT
KIT MICROPUNCTURE NIT STIFF (SHEATH) ×1 IMPLANT
KIT PV (KITS) ×2 IMPLANT
RESERVOIR CO2 (VASCULAR PRODUCTS) ×1 IMPLANT
SET FLUSH CO2 (MISCELLANEOUS) ×1 IMPLANT
SHEATH PINNACLE 5F 10CM (SHEATH) ×1 IMPLANT
SHEATH PROBE COVER 6X72 (BAG) ×1 IMPLANT
SYR MEDRAD MARK V 150ML (SYRINGE) ×1 IMPLANT
TRANSDUCER W/STOPCOCK (MISCELLANEOUS) ×2 IMPLANT
TRAY PV CATH (CUSTOM PROCEDURE TRAY) ×2 IMPLANT
WIRE BENTSON .035X145CM (WIRE) ×1 IMPLANT

## 2018-07-03 NOTE — Discharge Instructions (Signed)
Femoral Site Care °This sheet gives you information about how to care for yourself after your procedure. Your health care provider may also give you more specific instructions. If you have problems or questions, contact your health care provider. °What can I expect after the procedure? °After the procedure, it is common to have: °· Bruising that usually fades within 1-2 weeks. °· Tenderness at the site. °Follow these instructions at home: °Wound care °· Follow instructions from your health care provider about how to take care of your insertion site. Make sure you: °? Wash your hands with soap and water before you change your bandage (dressing). If soap and water are not available, use hand sanitizer. °? Change your dressing as told by your health care provider. °? Leave stitches (sutures), skin glue, or adhesive strips in place. These skin closures may need to stay in place for 2 weeks or longer. If adhesive strip edges start to loosen and curl up, you may trim the loose edges. Do not remove adhesive strips completely unless your health care provider tells you to do that. °· Do not take baths, swim, or use a hot tub until your health care provider approves. °· You may shower 24-48 hours after the procedure or as told by your health care provider. °? Gently wash the site with plain soap and water. °? Pat the area dry with a clean towel. °? Do not rub the site. This may cause bleeding. °· Do not apply powder or lotion to the site. Keep the site clean and dry. °· Check your femoral site every day for signs of infection. Check for: °? Redness, swelling, or pain. °? Fluid or blood. °? Warmth. °? Pus or a bad smell. °Activity °· For the first 2-3 days after your procedure, or as long as directed: °? Avoid climbing stairs as much as possible. °? Do not squat. °· Do not lift anything that is heavier than 10 lb (4.5 kg), or the limit that you are told, until your health care provider says that it is safe. °· Rest as  directed. °? Avoid sitting for a long time without moving. Get up to take short walks every 1-2 hours. °· Do not drive for 24 hours if you were given a medicine to help you relax (sedative). °General instructions °· Take over-the-counter and prescription medicines only as told by your health care provider. °· Keep all follow-up visits as told by your health care provider. This is important. °Contact a health care provider if you have: °· A fever or chills. °· You have redness, swelling, or pain around your insertion site. °Get help right away if: °· The catheter insertion area swells very fast. °· You pass out. °· You suddenly start to sweat or your skin gets clammy. °· The catheter insertion area is bleeding, and the bleeding does not stop when you hold steady pressure on the area. °· The area near or just beyond the catheter insertion site becomes pale, cool, tingly, or numb. °These symptoms may represent a serious problem that is an emergency. Do not wait to see if the symptoms will go away. Get medical help right away. Call your local emergency services (911 in the [place] Do not drive yourself to the hospital. °Summary °· After the procedure, it is common to have bruising that usually fades within 1-2 weeks. °· Check your femoral site every day for signs of infection. °· Do not lift anything that is heavier than 10 lb (4.5 kg), or the   limit that you are told, until your health care provider says that it is safe. °This information is not intended to replace advice given to you by your health care provider. Make sure you discuss any questions you have with your health care provider. °Document Released: 08/31/2013 Document Revised: 01/10/2017 Document Reviewed: 01/10/2017 °Elsevier Interactive Patient Education © 2019 [employer] ° °Moderate Conscious Sedation, Adult, Care After °These instructions provide you with information about caring for yourself after your procedure. Your health care provider may also give  you more specific instructions. Your treatment has been planned according to current medical practices, but problems sometimes occur. Call your health care provider if you have any problems or questions after your procedure. °What can I expect after the procedure? °After your procedure, it is common: °· To feel sleepy for several hours. °· To feel clumsy and have poor balance for several hours. °· To have poor judgment for several hours. °· To vomit if you eat too soon. °Follow these instructions at home: °For at least 24 hours after the procedure: ° °· Do not: °? Participate in activities where you could fall or become injured. °? Drive. °? Use heavy machinery. °? Drink alcohol. °? Take sleeping pills or medicines that cause drowsiness. °? Make important decisions or sign legal documents. °? Take care of children on your own. °· Rest. °Eating and drinking °· Follow the diet recommended by your health care provider. °· If you vomit: °? Drink water, juice, or soup when you can drink without vomiting. °? Make sure you have little or no nausea before eating solid foods. °General instructions °· Have a responsible adult stay with you until you are awake and alert. °· Take over-the-counter and prescription medicines only as told by your health care provider. °· If you smoke, do not smoke without supervision. °· Keep all follow-up visits as told by your health care provider. This is important. °Contact a health care provider if: °· You keep feeling nauseous or you keep vomiting. °· You feel light-headed. °· You develop a rash. °· You have a fever. °Get help right away if: °· You have trouble breathing. °This information is not intended to replace advice given to you by your health care provider. Make sure you discuss any questions you have with your health care provider. °Document Released: 10/18/2012 Document Revised: 06/02/2015 Document Reviewed: 04/19/2015 °Elsevier Interactive Patient Education © 2019 Elsevier  Inc. ° °

## 2018-07-03 NOTE — Op Note (Signed)
    Patient name: EMANNUEL VISE MRN: [id number] DOB: 28-Oct-1940 Sex: male  07/03/2018 Pre-operative Diagnosis: critical right lower extremity ischemia with great toe ulceration Post-operative diagnosis:  Same Surgeon:  Erlene Quan C. Donzetta Matters, MD Procedure Performed: 1.  Ultrasound-guided cannulation left common femoral artery 2.  CO2 aortogram  3.  Selection of right common femoral artery and right lower extremity angiogram 4.  Moderate sedation with fentanyl and versed for 5.  mynx device closure left common femoral artery for 23 minutes  Indications: 78 year old male with history of chronic kidney disease now with right great toe ulceration.  He is indicated for angiogram with possible intervention.  Findings: He does have calcification of his aorta and bilateral common and external iliac arteries but there does not appear to be any flow limitation and bilateral common femoral pulses are palpable.  On the right side he appears to occlude his SFA just after the takeoff and does not reconstitute healthy-appearing artery into the below-knee popliteal artery.  He has single-vessel runoff via the peroneal which gives rise to a posterior tibial artery at the level of the ankle.  Given the length of the occlusion we will get vein mapping today and have him scheduled for outpatient right common femoral to below-knee popliteal artery bypass with vein versus graft and possible first toe amputation.   Procedure:  The patient was identified in the holding area and taken to room 8.  The patient was then placed supine on the table and prepped and draped in the usual sterile fashion.  A time out was called.  Ultrasound was used to evaluate the left common femoral artery which was noted to be patent.  The area was anesthetized 1% lidocaine cannulated with micropuncture needle followed by wire and sheath.  At the same time fentanyl and Versed were administered and his blood pressure, heart rate, O2 saturations were  monitored throughout this case.  A 5 French sheath was placed in an Omni catheter level of L1 and CO2 aortogram performed.  We then used Bentson wire and Omni catheter to cross to the level the common femoral artery on the right and right lower extremity angiography was performed.  With the above findings I elected patient will need bypass.  Catheter was removed over wire.  Minx device was deployed in the groin.  He tolerated procedure well without immediate complication.  Contrast: 360 cc CO2   Omunique Pederson C. Donzetta Matters, MD [facility] Office: [phone] Pager: [phone]

## 2018-07-03 NOTE — Progress Notes (Signed)
RLE vein mapping       has been completed. Preliminary results can be found under CV proc through chart review. June Leap, BS, RDMS, RVT

## 2018-07-03 NOTE — H&P (Addendum)
   History and Physical Update  The patient was interviewed and re-examined.  The patient's previous History and Physical has been reviewed and is unchanged from recent evaluation by Dr. Scot Dock.  Plan will be for CO2 aortogram and right lower extremity angiogram with possible intervention.  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  This correlates to previous duplex he had performed a year and a half ago in our office demonstrating occluded SFA on the right.  We will need to limit contrast given advanced chronic kidney disease.  I discussed with him possible intervention today versus need for bypass and he demonstrates good understanding.  Thomas Mcguire C. Donzetta Matters, MD [facility] Office: [phone] Pager: [phone]   07/03/2018, 8:33 AM

## 2018-07-05 ENCOUNTER — Encounter: Payer: Self-pay | Admitting: *Deleted

## 2018-07-05 ENCOUNTER — Other Ambulatory Visit: Payer: Self-pay

## 2018-07-05 ENCOUNTER — Encounter (HOSPITAL_COMMUNITY): Payer: Medicare Other

## 2018-07-05 ENCOUNTER — Encounter: Payer: Medicare Other | Admitting: Vascular Surgery

## 2018-07-05 DIAGNOSIS — I7025 Atherosclerosis of native arteries of other extremities with ulceration: Secondary | ICD-10-CM

## 2018-07-07 ENCOUNTER — Ambulatory Visit (HOSPITAL_COMMUNITY)
Admission: RE | Admit: 2018-07-07 | Discharge: 2018-07-07 | Disposition: A | Discharge status: Home / Self Care | Payer: Medicare Other | Source: Ambulatory Visit | Attending: Family | Admitting: Family

## 2018-07-07 ENCOUNTER — Other Ambulatory Visit (HOSPITAL_COMMUNITY)
Admission: RE | Admit: 2018-07-07 | Discharge: 2018-07-07 | Disposition: A | Discharge status: Home / Self Care | Payer: Medicare Other | Source: Ambulatory Visit | Attending: Vascular Surgery | Admitting: Vascular Surgery

## 2018-07-07 ENCOUNTER — Other Ambulatory Visit: Payer: Self-pay

## 2018-07-07 DIAGNOSIS — Z1159 Encounter for screening for other viral diseases: Secondary | ICD-10-CM | POA: Insufficient documentation

## 2018-07-07 DIAGNOSIS — I7025 Atherosclerosis of native arteries of other extremities with ulceration: Secondary | ICD-10-CM | POA: Insufficient documentation

## 2018-07-07 LAB — SARS CORONAVIRUS 2 (TAT 6-24 HRS): SARS Coronavirus 2: NEGATIVE

## 2018-07-10 ENCOUNTER — Other Ambulatory Visit: Payer: Self-pay

## 2018-07-10 ENCOUNTER — Encounter (HOSPITAL_COMMUNITY): Payer: Self-pay | Admitting: *Deleted

## 2018-07-10 ENCOUNTER — Other Ambulatory Visit: Payer: Self-pay | Admitting: *Deleted

## 2018-07-10 NOTE — Progress Notes (Addendum)
Patient informed of the [place] that is currently in effect.  Patient verbalized understanding.  Patient denies shortness of breath, fever, cough and chest pain at PAT appointment  PCP - Dr Bevelyn Buckles- [facility] - Denies Urologist Dr Karsten Ro  Chest x-ray - Denies EKG - 05/23/18 Stress Test - Denies ECHO - 12/2001 Cardiac Cath - Denies  Fasting Blood Sugar  95-110 Checks Blood Sugar _4_ times a day  Aspirin Instructions: No on DOS per MD  Anesthesia review: yes  STOP now taking any Aspirin (unless otherwise instructed by your surgeon), Aleve, Naproxen, Ibuprofen, Motrin, Advil, Goody's, BC's, all herbal medications, fish oil, and all vitamins.  Coronavirus Screening Have you or your wife experienced the following symptoms:  Cough yes/no: No Fever (>100.44F)  yes/no: No Runny nose yes/no: No Sore throat yes/no: No Difficulty breathing/shortness of breath  yes/no: No  Have you or your family traveled in the last 14 days and where? yes/no: No

## 2018-07-10 NOTE — Anesthesia Preprocedure Evaluation (Addendum)
Anesthesia Evaluation  Patient identified by MRN, date of birth, ID band Patient awake    Reviewed: Allergy & Precautions, NPO status , Patient's Chart, lab work & pertinent test results, reviewed documented beta blocker date and time   History of Anesthesia Complications Negative for: history of anesthetic complications  Airway Mallampati: I  TM Distance: >3 FB Neck ROM: Full    Dental  (+) Edentulous Upper, Edentulous Lower   Pulmonary neg pulmonary ROS, former smoker,    Pulmonary exam normal        Cardiovascular hypertension, Pt. on medications and Pt. on home beta blockers + Peripheral Vascular Disease  Normal cardiovascular exam     Neuro/Psych negative neurological ROS  negative psych ROS   GI/Hepatic negative GI ROS, Neg liver ROS,   Endo/Other  diabetes, Type 2, Insulin Dependent  Renal/GU Renal InsufficiencyRenal disease (stage V CKD)  negative genitourinary   Musculoskeletal negative musculoskeletal ROS (+)   Abdominal   Peds  Hematology negative hematology ROS (+) anemia ,   Anesthesia Other Findings   Reproductive/Obstetrics                           Anesthesia Physical Anesthesia Plan  ASA: III  Anesthesia Plan: General   Post-op Pain Management:    Induction: Intravenous  PONV Risk Score and Plan: 2 and Ondansetron, Dexamethasone and Treatment may vary due to age or medical condition  Airway Management Planned: Oral ETT  Additional Equipment: None  Intra-op Plan:   Post-operative Plan: Extubation in OR  Informed Consent: I have reviewed the patients History and Physical, chart, labs and discussed the procedure including the risks, benefits and alternatives for the proposed anesthesia with the patient or authorized representative who has indicated his/her understanding and acceptance.     Dental advisory given  Plan Discussed with:   Anesthesia Plan  Comments: (Stage V chronic kidney disease followed by Dr. Carolin Sicks.  He had a right radiocephalic fistula placed on 05/23/2018.  He is not yet on dialysis.)     Anesthesia Quick Evaluation

## 2018-07-11 ENCOUNTER — Inpatient Hospital Stay (HOSPITAL_COMMUNITY): Payer: Medicare Other | Admitting: Physician Assistant

## 2018-07-11 ENCOUNTER — Encounter (HOSPITAL_COMMUNITY): Payer: Self-pay

## 2018-07-11 ENCOUNTER — Encounter (HOSPITAL_COMMUNITY)
Admission: RE | Disposition: A | Discharge status: Home Health | Payer: Self-pay | Source: Home / Self Care | Attending: Vascular Surgery

## 2018-07-11 ENCOUNTER — Inpatient Hospital Stay (HOSPITAL_COMMUNITY)
Admission: RE | Admit: 2018-07-11 | Discharge: 2018-07-14 | DRG: 253 | Disposition: A | Discharge status: Home Health | Payer: Medicare Other | Attending: Vascular Surgery | Admitting: Vascular Surgery

## 2018-07-11 DIAGNOSIS — E1122 Type 2 diabetes mellitus with diabetic chronic kidney disease: Secondary | ICD-10-CM | POA: Diagnosis present

## 2018-07-11 DIAGNOSIS — L03031 Cellulitis of right toe: Secondary | ICD-10-CM | POA: Diagnosis present

## 2018-07-11 DIAGNOSIS — Z794 Long term (current) use of insulin: Secondary | ICD-10-CM

## 2018-07-11 DIAGNOSIS — Z89422 Acquired absence of other left toe(s): Secondary | ICD-10-CM | POA: Diagnosis not present

## 2018-07-11 DIAGNOSIS — Z856 Personal history of leukemia: Secondary | ICD-10-CM | POA: Diagnosis not present

## 2018-07-11 DIAGNOSIS — E1140 Type 2 diabetes mellitus with diabetic neuropathy, unspecified: Secondary | ICD-10-CM | POA: Diagnosis present

## 2018-07-11 DIAGNOSIS — Z87891 Personal history of nicotine dependence: Secondary | ICD-10-CM | POA: Diagnosis not present

## 2018-07-11 DIAGNOSIS — E1152 Type 2 diabetes mellitus with diabetic peripheral angiopathy with gangrene: Principal | ICD-10-CM | POA: Diagnosis present

## 2018-07-11 DIAGNOSIS — E785 Hyperlipidemia, unspecified: Secondary | ICD-10-CM | POA: Diagnosis present

## 2018-07-11 DIAGNOSIS — I96 Gangrene, not elsewhere classified: Secondary | ICD-10-CM

## 2018-07-11 DIAGNOSIS — I70291 Other atherosclerosis of native arteries of extremities, right leg: Secondary | ICD-10-CM | POA: Diagnosis present

## 2018-07-11 DIAGNOSIS — I120 Hypertensive chronic kidney disease with stage 5 chronic kidney disease or end stage renal disease: Secondary | ICD-10-CM | POA: Diagnosis present

## 2018-07-11 DIAGNOSIS — I70235 Atherosclerosis of native arteries of right leg with ulceration of other part of foot: Secondary | ICD-10-CM

## 2018-07-11 DIAGNOSIS — N185 Chronic kidney disease, stage 5: Secondary | ICD-10-CM | POA: Diagnosis present

## 2018-07-11 DIAGNOSIS — Z7982 Long term (current) use of aspirin: Secondary | ICD-10-CM

## 2018-07-11 DIAGNOSIS — Z79899 Other long term (current) drug therapy: Secondary | ICD-10-CM | POA: Diagnosis not present

## 2018-07-11 DIAGNOSIS — Z1159 Encounter for screening for other viral diseases: Secondary | ICD-10-CM | POA: Diagnosis not present

## 2018-07-11 DIAGNOSIS — I739 Peripheral vascular disease, unspecified: Secondary | ICD-10-CM | POA: Diagnosis present

## 2018-07-11 HISTORY — DX: Peripheral vascular disease, unspecified: I73.9

## 2018-07-11 HISTORY — DX: Presence of dental prosthetic device (complete) (partial): Z97.2

## 2018-07-11 HISTORY — PX: FEMORAL-POPLITEAL BYPASS GRAFT: SHX937

## 2018-07-11 HISTORY — PX: AMPUTATION: SHX166

## 2018-07-11 LAB — URINALYSIS, ROUTINE W REFLEX MICROSCOPIC
Bacteria, UA: NONE SEEN
Bilirubin Urine: NEGATIVE
Glucose, UA: NEGATIVE mg/dL
Hgb urine dipstick: NEGATIVE
Ketones, ur: NEGATIVE mg/dL
Leukocytes,Ua: NEGATIVE
Nitrite: NEGATIVE
Protein, ur: 100 mg/dL — AB
Specific Gravity, Urine: 1.009 (ref 1.005–1.030)
pH: 6 (ref 5.0–8.0)

## 2018-07-11 LAB — COMPREHENSIVE METABOLIC PANEL
ALT: 10 U/L (ref 0–44)
AST: 17 U/L (ref 15–41)
Albumin: 3.8 g/dL (ref 3.5–5.0)
Alkaline Phosphatase: 58 U/L (ref 38–126)
Anion gap: 10 (ref 5–15)
BUN: 43 mg/dL — ABNORMAL HIGH (ref 8–23)
CO2: 21 mmol/L — ABNORMAL LOW (ref 22–32)
Calcium: 9.2 mg/dL (ref 8.9–10.3)
Chloride: 105 mmol/L (ref 98–111)
Creatinine, Ser: 4.33 mg/dL — ABNORMAL HIGH (ref 0.61–1.24)
GFR calc Af Amer: 14 mL/min — ABNORMAL LOW (ref >60)
GFR calc non Af Amer: 12 mL/min — ABNORMAL LOW (ref >60)
Glucose, Bld: 111 mg/dL — ABNORMAL HIGH (ref 70–99)
Potassium: 4.5 mmol/L (ref 3.5–5.1)
Sodium: 136 mmol/L (ref 135–145)
Total Bilirubin: 0.7 mg/dL (ref 0.3–1.2)
Total Protein: 7 g/dL (ref 6.5–8.1)

## 2018-07-11 LAB — HEMOGLOBIN A1C
Hgb A1c MFr Bld: 6.8 % — ABNORMAL HIGH (ref 4.8–5.6)
Mean Plasma Glucose: 148.46 mg/dL

## 2018-07-11 LAB — APTT: aPTT: 31 seconds (ref 24–36)

## 2018-07-11 LAB — CBC
HCT: 38.4 % — ABNORMAL LOW (ref 39.0–52.0)
Hemoglobin: 11.9 g/dL — ABNORMAL LOW (ref 13.0–17.0)
MCH: 26.8 pg (ref 26.0–34.0)
MCHC: 31 g/dL (ref 30.0–36.0)
MCV: 86.5 fL (ref 80.0–100.0)
Platelets: 149 10*3/uL — ABNORMAL LOW (ref 150–400)
RBC: 4.44 MIL/uL (ref 4.22–5.81)
RDW: 16 % — ABNORMAL HIGH (ref 11.5–15.5)
WBC: 22.3 10*3/uL — ABNORMAL HIGH (ref 4.0–10.5)
nRBC: 0 % (ref 0.0–0.2)

## 2018-07-11 LAB — PROTIME-INR
INR: 1.2 (ref 0.8–1.2)
Prothrombin Time: 14.8 seconds (ref 11.4–15.2)

## 2018-07-11 LAB — TYPE AND SCREEN
ABO/RH(D): O POS
Antibody Screen: NEGATIVE

## 2018-07-11 LAB — ABO/RH: ABO/RH(D): O POS

## 2018-07-11 LAB — GLUCOSE, CAPILLARY
Glucose-Capillary: 123 mg/dL — ABNORMAL HIGH (ref 70–99)
Glucose-Capillary: 143 mg/dL — ABNORMAL HIGH (ref 70–99)
Glucose-Capillary: 165 mg/dL — ABNORMAL HIGH (ref 70–99)

## 2018-07-11 SURGERY — BYPASS GRAFT FEMORAL-POPLITEAL ARTERY
Anesthesia: General | Site: Toe | Laterality: Right

## 2018-07-11 MED ORDER — CALCITRIOL 0.25 MCG PO CAPS
0.2500 ug | ORAL_CAPSULE | Freq: Every day | ORAL | Status: DC
  Administered 2018-07-12 – 2018-07-14 (×3): 0.25 ug via ORAL
  Filled 2018-07-11 (×3): qty 1

## 2018-07-11 MED ORDER — EPHEDRINE 5 MG/ML INJ
INTRAVENOUS | Status: AC
  Filled 2018-07-11: qty 10

## 2018-07-11 MED ORDER — SODIUM CHLORIDE 0.9 % IV SOLN
INTRAVENOUS | Status: DC
  Administered 2018-07-11 (×2): via INTRAVENOUS

## 2018-07-11 MED ORDER — PAPAVERINE HCL 30 MG/ML IJ SOLN
INTRAMUSCULAR | Status: AC
  Filled 2018-07-11: qty 2

## 2018-07-11 MED ORDER — INSULIN ASPART 100 UNIT/ML ~~LOC~~ SOLN
0.0000 [IU] | SUBCUTANEOUS | Status: DC
  Administered 2018-07-11: 2 [IU] via SUBCUTANEOUS
  Administered 2018-07-11: 1 [IU] via SUBCUTANEOUS
  Administered 2018-07-12 (×3): 2 [IU] via SUBCUTANEOUS
  Administered 2018-07-12: 1 [IU] via SUBCUTANEOUS
  Administered 2018-07-12: 2 [IU] via SUBCUTANEOUS

## 2018-07-11 MED ORDER — DEXMEDETOMIDINE HCL IN NACL 200 MCG/50ML IV SOLN
INTRAVENOUS | Status: AC
  Filled 2018-07-11: qty 50

## 2018-07-11 MED ORDER — INSULIN NPH (HUMAN) (ISOPHANE) 100 UNIT/ML ~~LOC~~ SUSP
50.0000 [IU] | Freq: Two times a day (BID) | SUBCUTANEOUS | Status: DC
  Filled 2018-07-11: qty 10

## 2018-07-11 MED ORDER — METOPROLOL TARTRATE 5 MG/5ML IV SOLN: 2.0000 mg | INTRAVENOUS | Status: DC | PRN

## 2018-07-11 MED ORDER — BISACODYL 5 MG PO TBEC: 5.0000 mg | DELAYED_RELEASE_TABLET | Freq: Every day | ORAL | Status: DC | PRN

## 2018-07-11 MED ORDER — HEPARIN SODIUM (PORCINE) 5000 UNIT/ML IJ SOLN
5000.0000 [IU] | Freq: Three times a day (TID) | INTRAMUSCULAR | Status: DC
  Administered 2018-07-11 – 2018-07-14 (×8): 5000 [IU] via SUBCUTANEOUS
  Filled 2018-07-11 (×6): qty 1

## 2018-07-11 MED ORDER — FENTANYL CITRATE (PF) 100 MCG/2ML IJ SOLN: 25.0000 ug | INTRAMUSCULAR | Status: DC | PRN

## 2018-07-11 MED ORDER — ASPIRIN EC 81 MG PO TBEC
81.0000 mg | DELAYED_RELEASE_TABLET | Freq: Every day | ORAL | Status: DC
  Administered 2018-07-12 – 2018-07-14 (×3): 81 mg via ORAL
  Filled 2018-07-11 (×3): qty 1

## 2018-07-11 MED ORDER — ONDANSETRON HCL 4 MG/2ML IJ SOLN: 4.0000 mg | Freq: Four times a day (QID) | INTRAMUSCULAR | Status: DC | PRN

## 2018-07-11 MED ORDER — FENTANYL CITRATE (PF) 100 MCG/2ML IJ SOLN
INTRAMUSCULAR | Status: DC | PRN
  Administered 2018-07-11 (×6): 50 ug via INTRAVENOUS

## 2018-07-11 MED ORDER — FOLIC ACID 1 MG PO TABS
1.0000 mg | ORAL_TABLET | Freq: Every day | ORAL | Status: DC
  Administered 2018-07-12 – 2018-07-14 (×3): 1 mg via ORAL
  Filled 2018-07-11 (×3): qty 1

## 2018-07-11 MED ORDER — LABETALOL HCL 5 MG/ML IV SOLN: 10.0000 mg | INTRAVENOUS | Status: DC | PRN

## 2018-07-11 MED ORDER — DEXAMETHASONE SODIUM PHOSPHATE 10 MG/ML IJ SOLN
INTRAMUSCULAR | Status: AC
  Filled 2018-07-11: qty 1

## 2018-07-11 MED ORDER — HYDRALAZINE HCL 20 MG/ML IJ SOLN: 5.0000 mg | INTRAMUSCULAR | Status: DC | PRN

## 2018-07-11 MED ORDER — HEPARIN SODIUM (PORCINE) 1000 UNIT/ML IJ SOLN
INTRAMUSCULAR | Status: DC | PRN
  Administered 2018-07-11: 10000 [IU] via INTRAVENOUS

## 2018-07-11 MED ORDER — SODIUM CHLORIDE 0.9 % IV SOLN
INTRAVENOUS | Status: AC
  Filled 2018-07-11: qty 1.2

## 2018-07-11 MED ORDER — CEFAZOLIN SODIUM-DEXTROSE 2-4 GM/100ML-% IV SOLN
INTRAVENOUS | Status: AC
  Filled 2018-07-11: qty 100

## 2018-07-11 MED ORDER — DEXAMETHASONE SODIUM PHOSPHATE 10 MG/ML IJ SOLN
INTRAMUSCULAR | Status: DC | PRN
  Administered 2018-07-11: 5 mg via INTRAVENOUS

## 2018-07-11 MED ORDER — CEFAZOLIN SODIUM-DEXTROSE 2-4 GM/100ML-% IV SOLN
2.0000 g | Freq: Two times a day (BID) | INTRAVENOUS | Status: AC
  Administered 2018-07-11 – 2018-07-12 (×2): 2 g via INTRAVENOUS
  Filled 2018-07-11 (×2): qty 100

## 2018-07-11 MED ORDER — PROPOFOL 10 MG/ML IV BOLUS
INTRAVENOUS | Status: DC | PRN
  Administered 2018-07-11: 130 mg via INTRAVENOUS

## 2018-07-11 MED ORDER — SODIUM CHLORIDE 0.9 % IV SOLN: 500.0000 mL | Freq: Once | INTRAVENOUS | Status: DC | PRN

## 2018-07-11 MED ORDER — ROCURONIUM BROMIDE 100 MG/10ML IV SOLN
INTRAVENOUS | Status: DC | PRN
  Administered 2018-07-11 (×2): 10 mg via INTRAVENOUS
  Administered 2018-07-11: 60 mg via INTRAVENOUS
  Administered 2018-07-11 (×2): 10 mg via INTRAVENOUS
  Administered 2018-07-11: 20 mg via INTRAVENOUS
  Administered 2018-07-11 (×2): 10 mg via INTRAVENOUS

## 2018-07-11 MED ORDER — FENTANYL CITRATE (PF) 250 MCG/5ML IJ SOLN
INTRAMUSCULAR | Status: AC
  Filled 2018-07-11: qty 5

## 2018-07-11 MED ORDER — DOXAZOSIN MESYLATE 2 MG PO TABS
2.0000 mg | ORAL_TABLET | ORAL | Status: DC
  Administered 2018-07-12 – 2018-07-14 (×3): 2 mg via ORAL
  Filled 2018-07-11 (×3): qty 1

## 2018-07-11 MED ORDER — ROCURONIUM BROMIDE 10 MG/ML (PF) SYRINGE
PREFILLED_SYRINGE | INTRAVENOUS | Status: AC
  Filled 2018-07-11: qty 30

## 2018-07-11 MED ORDER — THROMBIN (RECOMBINANT) 20000 UNITS EX SOLR
CUTANEOUS | Status: AC
  Filled 2018-07-11: qty 20000

## 2018-07-11 MED ORDER — DOCUSATE SODIUM 100 MG PO CAPS
100.0000 mg | ORAL_CAPSULE | Freq: Every day | ORAL | Status: DC
  Administered 2018-07-12 – 2018-07-14 (×2): 100 mg via ORAL
  Filled 2018-07-11 (×3): qty 1

## 2018-07-11 MED ORDER — CEFAZOLIN SODIUM-DEXTROSE 2-4 GM/100ML-% IV SOLN
2.0000 g | INTRAVENOUS | Status: AC
  Administered 2018-07-11: 2 g via INTRAVENOUS

## 2018-07-11 MED ORDER — LIDOCAINE 2% (20 MG/ML) 5 ML SYRINGE
INTRAMUSCULAR | Status: AC
  Filled 2018-07-11: qty 15

## 2018-07-11 MED ORDER — ONDANSETRON HCL 4 MG/2ML IJ SOLN: 4.0000 mg | Freq: Once | INTRAMUSCULAR | Status: DC | PRN

## 2018-07-11 MED ORDER — SUGAMMADEX SODIUM 200 MG/2ML IV SOLN
INTRAVENOUS | Status: DC | PRN
  Administered 2018-07-11: 200 mg via INTRAVENOUS

## 2018-07-11 MED ORDER — AMLODIPINE BESYLATE 5 MG PO TABS
5.0000 mg | ORAL_TABLET | Freq: Every day | ORAL | Status: DC
  Administered 2018-07-12 – 2018-07-14 (×3): 5 mg via ORAL
  Filled 2018-07-11 (×3): qty 1

## 2018-07-11 MED ORDER — ESMOLOL HCL 100 MG/10ML IV SOLN
INTRAVENOUS | Status: AC
  Filled 2018-07-11: qty 10

## 2018-07-11 MED ORDER — PRAVASTATIN SODIUM 40 MG PO TABS
40.0000 mg | ORAL_TABLET | Freq: Every day | ORAL | Status: DC
  Administered 2018-07-11 – 2018-07-13 (×3): 40 mg via ORAL
  Filled 2018-07-11 (×3): qty 1

## 2018-07-11 MED ORDER — ONDANSETRON HCL 4 MG/2ML IJ SOLN
INTRAMUSCULAR | Status: AC
  Filled 2018-07-11: qty 2

## 2018-07-11 MED ORDER — PROTAMINE SULFATE 10 MG/ML IV SOLN
INTRAVENOUS | Status: AC
  Filled 2018-07-11: qty 5

## 2018-07-11 MED ORDER — PHENOL 1.4 % MT LIQD: 1.0000 | OROMUCOSAL | Status: DC | PRN

## 2018-07-11 MED ORDER — MORPHINE SULFATE (PF) 2 MG/ML IV SOLN
1.0000 mg | INTRAVENOUS | Status: DC | PRN
  Administered 2018-07-14: 2 mg via INTRAVENOUS
  Filled 2018-07-11: qty 1

## 2018-07-11 MED ORDER — ALLOPURINOL 300 MG PO TABS
300.0000 mg | ORAL_TABLET | Freq: Every day | ORAL | Status: DC
  Administered 2018-07-12 – 2018-07-14 (×3): 300 mg via ORAL
  Filled 2018-07-11 (×3): qty 1

## 2018-07-11 MED ORDER — BACITRACIN ZINC 500 UNIT/GM EX OINT
TOPICAL_OINTMENT | CUTANEOUS | Status: AC
  Filled 2018-07-11: qty 28.35

## 2018-07-11 MED ORDER — ALUM & MAG HYDROXIDE-SIMETH 200-200-20 MG/5ML PO SUSP: 15.0000 mL | ORAL | Status: DC | PRN

## 2018-07-11 MED ORDER — OXYCODONE HCL 5 MG/5ML PO SOLN: 5.0000 mg | Freq: Once | ORAL | Status: AC | PRN

## 2018-07-11 MED ORDER — INSULIN ASPART 100 UNIT/ML ~~LOC~~ SOLN: 20.0000 [IU] | Freq: Three times a day (TID) | SUBCUTANEOUS | Status: DC

## 2018-07-11 MED ORDER — SODIUM CHLORIDE 0.9 % IV SOLN
INTRAVENOUS | Status: DC | PRN
  Administered 2018-07-11: 500 mL

## 2018-07-11 MED ORDER — MUPIROCIN 2 % EX OINT
TOPICAL_OINTMENT | CUTANEOUS | Status: AC
  Filled 2018-07-11: qty 22

## 2018-07-11 MED ORDER — CARVEDILOL 12.5 MG PO TABS
12.5000 mg | ORAL_TABLET | Freq: Two times a day (BID) | ORAL | Status: DC
  Administered 2018-07-11 – 2018-07-14 (×6): 12.5 mg via ORAL
  Filled 2018-07-11 (×6): qty 1

## 2018-07-11 MED ORDER — SENNOSIDES-DOCUSATE SODIUM 8.6-50 MG PO TABS
1.0000 | ORAL_TABLET | Freq: Every evening | ORAL | Status: DC | PRN
  Filled 2018-07-11: qty 1

## 2018-07-11 MED ORDER — FERROUS SULFATE 325 (65 FE) MG PO TABS
325.0000 mg | ORAL_TABLET | Freq: Two times a day (BID) | ORAL | Status: DC
  Administered 2018-07-11 – 2018-07-14 (×6): 325 mg via ORAL
  Filled 2018-07-11 (×6): qty 1

## 2018-07-11 MED ORDER — VECURONIUM BROMIDE 10 MG IV SOLR
INTRAVENOUS | Status: AC
  Filled 2018-07-11: qty 10

## 2018-07-11 MED ORDER — CHLORHEXIDINE GLUCONATE CLOTH 2 % EX PADS: 6.0000 | MEDICATED_PAD | Freq: Once | CUTANEOUS | Status: DC

## 2018-07-11 MED ORDER — SODIUM CHLORIDE 0.9 % IV SOLN
INTRAVENOUS | Status: DC
  Administered 2018-07-11 – 2018-07-12 (×2): via INTRAVENOUS

## 2018-07-11 MED ORDER — OXYCODONE HCL 5 MG PO TABS
ORAL_TABLET | ORAL | Status: AC
  Filled 2018-07-11: qty 1

## 2018-07-11 MED ORDER — OXYCODONE HCL 5 MG PO TABS
5.0000 mg | ORAL_TABLET | Freq: Once | ORAL | Status: AC | PRN
  Administered 2018-07-11: 5 mg via ORAL

## 2018-07-11 MED ORDER — ISOSORBIDE MONONITRATE ER 30 MG PO TB24
30.0000 mg | ORAL_TABLET | Freq: Every day | ORAL | Status: DC
  Administered 2018-07-12 – 2018-07-14 (×3): 30 mg via ORAL
  Filled 2018-07-11 (×3): qty 1

## 2018-07-11 MED ORDER — FUROSEMIDE 40 MG PO TABS
40.0000 mg | ORAL_TABLET | Freq: Two times a day (BID) | ORAL | Status: DC
  Administered 2018-07-11 – 2018-07-14 (×6): 40 mg via ORAL
  Filled 2018-07-11 (×6): qty 1

## 2018-07-11 MED ORDER — EPHEDRINE SULFATE-NACL 50-0.9 MG/10ML-% IV SOSY
PREFILLED_SYRINGE | INTRAVENOUS | Status: DC | PRN
  Administered 2018-07-11 (×5): 5 mg via INTRAVENOUS

## 2018-07-11 MED ORDER — CALCIUM CHLORIDE 10 % IV SOLN
INTRAVENOUS | Status: AC
  Filled 2018-07-11: qty 10

## 2018-07-11 MED ORDER — 0.9 % SODIUM CHLORIDE (POUR BTL) OPTIME
TOPICAL | Status: DC | PRN
  Administered 2018-07-11: 11:00:00 2000 mL

## 2018-07-11 MED ORDER — POTASSIUM CHLORIDE CRYS ER 20 MEQ PO TBCR: 20.0000 meq | EXTENDED_RELEASE_TABLET | Freq: Every day | ORAL | Status: DC | PRN

## 2018-07-11 MED ORDER — ONDANSETRON HCL 4 MG/2ML IJ SOLN
INTRAMUSCULAR | Status: DC | PRN
  Administered 2018-07-11: 4 mg via INTRAVENOUS

## 2018-07-11 MED ORDER — ACETAMINOPHEN 325 MG PO TABS: 325.0000 mg | ORAL_TABLET | ORAL | Status: DC | PRN

## 2018-07-11 MED ORDER — PAPAVERINE HCL 30 MG/ML IJ SOLN
INTRAMUSCULAR | Status: DC | PRN
  Administered 2018-07-11: 2 mL via INTRAVENOUS

## 2018-07-11 MED ORDER — ACETAMINOPHEN 325 MG RE SUPP: 325.0000 mg | RECTAL | Status: DC | PRN

## 2018-07-11 MED ORDER — GUAIFENESIN-DM 100-10 MG/5ML PO SYRP: 15.0000 mL | ORAL_SOLUTION | ORAL | Status: DC | PRN

## 2018-07-11 MED ORDER — PANTOPRAZOLE SODIUM 40 MG PO TBEC
40.0000 mg | DELAYED_RELEASE_TABLET | Freq: Every day | ORAL | Status: DC
  Administered 2018-07-11 – 2018-07-14 (×3): 40 mg via ORAL
  Filled 2018-07-11 (×4): qty 1

## 2018-07-11 MED ORDER — PROTAMINE SULFATE 10 MG/ML IV SOLN
INTRAVENOUS | Status: DC | PRN
  Administered 2018-07-11: 50 mg via INTRAVENOUS

## 2018-07-11 MED ORDER — POTASSIUM CHLORIDE ER 10 MEQ PO TBCR
10.0000 meq | EXTENDED_RELEASE_TABLET | Freq: Every day | ORAL | Status: DC
  Administered 2018-07-12 – 2018-07-14 (×3): 10 meq via ORAL
  Filled 2018-07-11 (×8): qty 1

## 2018-07-11 MED ORDER — LIDOCAINE 2% (20 MG/ML) 5 ML SYRINGE
INTRAMUSCULAR | Status: DC | PRN
  Administered 2018-07-11: 100 mg via INTRAVENOUS

## 2018-07-11 MED ORDER — HEPARIN SODIUM (PORCINE) 1000 UNIT/ML IJ SOLN
INTRAMUSCULAR | Status: AC
  Filled 2018-07-11: qty 1

## 2018-07-11 MED ORDER — MAGNESIUM SULFATE 2 GM/50ML IV SOLN: 2.0000 g | Freq: Every day | INTRAVENOUS | Status: DC | PRN

## 2018-07-11 MED ORDER — HYDROCODONE-ACETAMINOPHEN 5-325 MG PO TABS
1.0000 | ORAL_TABLET | ORAL | Status: DC | PRN
  Administered 2018-07-11 – 2018-07-12 (×4): 2 via ORAL
  Administered 2018-07-12: 1 via ORAL
  Administered 2018-07-13 – 2018-07-14 (×5): 2 via ORAL
  Filled 2018-07-11: qty 2
  Filled 2018-07-11: qty 1
  Filled 2018-07-11 (×8): qty 2

## 2018-07-11 SURGICAL SUPPLY — 77 items
BANDAGE ACE 4X5 VEL STRL LF (GAUZE/BANDAGES/DRESSINGS) ×4 IMPLANT
BANDAGE ESMARK 6X9 LF (GAUZE/BANDAGES/DRESSINGS) IMPLANT
BLADE AVERAGE 25X9 (BLADE) ×1 IMPLANT
BLADE SURG 10 STRL SS (BLADE) ×1 IMPLANT
BNDG ESMARK 6X9 LF (GAUZE/BANDAGES/DRESSINGS) ×3
BNDG GAUZE ELAST 4 BULKY (GAUZE/BANDAGES/DRESSINGS) ×3 IMPLANT
BNDG STRETCH 4X75 NS LF (GAUZE/BANDAGES/DRESSINGS) ×1 IMPLANT
CANISTER SUCT 3000ML PPV (MISCELLANEOUS) ×3 IMPLANT
CANNULA VESSEL 3MM 2 BLNT TIP (CANNULA) ×6 IMPLANT
CLIP VESOCCLUDE MED 24/CT (CLIP) ×4 IMPLANT
CLIP VESOCCLUDE SM WIDE 24/CT (CLIP) ×5 IMPLANT
COVER PROBE W GEL 5X96 (DRAPES) IMPLANT
COVER SURGICAL LIGHT HANDLE (MISCELLANEOUS) ×2 IMPLANT
COVER WAND RF STERILE (DRAPES) ×2 IMPLANT
CUFF TOURN SGL QUICK 24 (TOURNIQUET CUFF) ×1
CUFF TOURN SGL QUICK 34 (TOURNIQUET CUFF)
CUFF TOURNIQUET SINGLE 44IN (TOURNIQUET CUFF) IMPLANT
CUFF TRNQT CYL 24X4X16.5-23 (TOURNIQUET CUFF) IMPLANT
CUFF TRNQT CYL 34X4.125X (TOURNIQUET CUFF) IMPLANT
DERMABOND ADVANCED (GAUZE/BANDAGES/DRESSINGS) ×3
DERMABOND ADVANCED .7 DNX12 (GAUZE/BANDAGES/DRESSINGS) ×2 IMPLANT
DRAIN CHANNEL 15F RND FF W/TCR (WOUND CARE) IMPLANT
DRAPE EXTREMITY T 121X128X90 (DISPOSABLE) ×2 IMPLANT
DRAPE HALF SHEET 40X57 (DRAPES) ×3 IMPLANT
DRAPE X-RAY CASS 24X20 (DRAPES) IMPLANT
ELECT REM PT RETURN 9FT ADLT (ELECTROSURGICAL) ×3
ELECTRODE REM PT RTRN 9FT ADLT (ELECTROSURGICAL) ×2 IMPLANT
EVACUATOR SILICONE 100CC (DRAIN) IMPLANT
GAUZE SPONGE 4X4 12PLY STRL (GAUZE/BANDAGES/DRESSINGS) ×3 IMPLANT
GLOVE BIO SURGEON STRL SZ 6.5 (GLOVE) ×1 IMPLANT
GLOVE BIO SURGEON STRL SZ7.5 (GLOVE) ×4 IMPLANT
GLOVE BIOGEL M 7.0 STRL (GLOVE) ×1 IMPLANT
GLOVE BIOGEL PI IND STRL 6.5 (GLOVE) IMPLANT
GLOVE BIOGEL PI IND STRL 7.5 (GLOVE) IMPLANT
GLOVE BIOGEL PI IND STRL 8 (GLOVE) ×2 IMPLANT
GLOVE BIOGEL PI INDICATOR 6.5 (GLOVE) ×1
GLOVE BIOGEL PI INDICATOR 7.5 (GLOVE) ×1
GLOVE BIOGEL PI INDICATOR 8 (GLOVE) ×3
GLOVE BIOGEL PI ORTHO PRO 7.5 (GLOVE) ×1
GLOVE ECLIPSE 7.0 STRL STRAW (GLOVE) ×1 IMPLANT
GLOVE ECLIPSE 8.0 STRL XLNG CF (GLOVE) ×1 IMPLANT
GLOVE PI ORTHO PRO STRL 7.5 (GLOVE) IMPLANT
GLOVE SURG SS PI 6.5 STRL IVOR (GLOVE) ×1 IMPLANT
GOWN SPEC L3 XXLG W/TWL (GOWN DISPOSABLE) ×1 IMPLANT
GOWN STRL NON-REIN LRG LVL3 (GOWN DISPOSABLE) ×1 IMPLANT
GOWN STRL REUS W/ TWL LRG LVL3 (GOWN DISPOSABLE) ×6 IMPLANT
GOWN STRL REUS W/TWL 2XL LVL3 (GOWN DISPOSABLE) ×1 IMPLANT
GOWN STRL REUS W/TWL LRG LVL3 (GOWN DISPOSABLE) ×3
KIT BASIN OR (CUSTOM PROCEDURE TRAY) ×3 IMPLANT
KIT TURNOVER KIT B (KITS) ×3 IMPLANT
MARKER GRAFT CORONARY BYPASS (MISCELLANEOUS) ×1 IMPLANT
NS IRRIG 1000ML POUR BTL (IV SOLUTION) ×6 IMPLANT
PACK GENERAL/GYN (CUSTOM PROCEDURE TRAY) ×2 IMPLANT
PACK PERIPHERAL VASCULAR (CUSTOM PROCEDURE TRAY) ×3 IMPLANT
PAD ARMBOARD 7.5X6 YLW CONV (MISCELLANEOUS) ×6 IMPLANT
SET COLLECT BLD 21X3/4 12 (NEEDLE) IMPLANT
SPONGE LAP 18X18 RF (DISPOSABLE) ×1 IMPLANT
SPONGE SURGIFOAM ABS GEL 100 (HEMOSTASIS) IMPLANT
STOPCOCK 4 WAY LG BORE MALE ST (IV SETS) IMPLANT
SUT ETHILON 3 0 PS 1 (SUTURE) ×4 IMPLANT
SUT PROLENE 5 0 C 1 24 (SUTURE) ×8 IMPLANT
SUT PROLENE 6 0 BV (SUTURE) ×6 IMPLANT
SUT SILK 2 0 SH (SUTURE) ×3 IMPLANT
SUT SILK 3 0 (SUTURE) ×2
SUT SILK 3-0 18XBRD TIE 12 (SUTURE) IMPLANT
SUT VIC AB 2-0 CT1 27 (SUTURE) ×1
SUT VIC AB 2-0 CT1 TAPERPNT 27 (SUTURE) IMPLANT
SUT VIC AB 2-0 CTB1 (SUTURE) ×5 IMPLANT
SUT VIC AB 3-0 SH 27 (SUTURE) ×3
SUT VIC AB 3-0 SH 27X BRD (SUTURE) ×4 IMPLANT
SUT VICRYL 4-0 PS2 18IN ABS (SUTURE) ×9 IMPLANT
TOWEL GREEN STERILE (TOWEL DISPOSABLE) ×6 IMPLANT
TOWEL GREEN STERILE FF (TOWEL DISPOSABLE) ×3 IMPLANT
TRAY FOLEY MTR SLVR 16FR STAT (SET/KITS/TRAYS/PACK) ×3 IMPLANT
TUBING EXTENTION W/L.L. (IV SETS) IMPLANT
UNDERPAD 30X30 (UNDERPADS AND DIAPERS) ×3 IMPLANT
WATER STERILE IRR 1000ML POUR (IV SOLUTION) ×3 IMPLANT

## 2018-07-11 NOTE — Anesthesia Procedure Notes (Signed)
Procedure Name: Intubation Date/Time: 07/11/2018 10:14 AM Performed by: Gwyndolyn Saxon, CRNA Pre-anesthesia Checklist: Patient identified, Emergency Drugs available, Suction available and Patient being monitored Patient Re-evaluated:Patient Re-evaluated prior to induction Oxygen Delivery Method: Circle system utilized Preoxygenation: Pre-oxygenation with 100% oxygen Induction Type: IV induction Ventilation: Mask ventilation without difficulty and Oral airway inserted - appropriate to patient size Laryngoscope Size: Mac and 4 Grade View: Grade I Tube type: Oral Tube size: 7.5 mm Number of attempts: 1 Airway Equipment and Method: Patient positioned with wedge pillow and Stylet Placement Confirmation: ETT inserted through vocal cords under direct vision,  positive ETCO2 and breath sounds checked- equal and bilateral Secured at: 23 cm Tube secured with: Tape Dental Injury: Teeth and Oropharynx as per pre-operative assessment

## 2018-07-11 NOTE — Plan of Care (Signed)
Poc progressing.  

## 2018-07-11 NOTE — Op Note (Signed)
NAME: Thomas Mcguire    MRN: [id number] DOB: 29-Jan-1940    DATE OF OPERATION: 07/11/2018  PREOP DIAGNOSIS:    Critical limb ischemia right lower extremity  POSTOP DIAGNOSIS:    Same  PROCEDURE:    1.  Right femoral to below-knee popliteal artery bypass with non-reversed translocated saphenous vein graft 2.  Amputation of right great toe  SURGEON: Judeth Cornfield. Scot Dock, MD, FACS  ASSIST: Laurence Slate, PA  ANESTHESIA: General  EBL: Minimal  INDICATIONS:    LORANCE PICKERAL is a 78 y.o. male who presented with a nonhealing gangrenous right great toe wound.  He had severe infrainguinal arterial occlusive disease.  He was not a candidate for endovascular approach.  He had occlusion of the superficial femoral artery with reconstitution of the below-knee popliteal artery.  His only runoff was the peroneal artery.  This was felt to be his only chance for limb salvage.  FINDINGS:   Reasonable bleeding at the toe amputation site.  An intraoperative arteriogram was not obtained because of his advanced chronic kidney disease.  He has peroneal runoff only.  TECHNIQUE:   The patient was taken to the operating room and received a general anesthetic.  The entire right lower extremity was prepped and draped in usual sterile fashion.  I looked at the right great saphenous vein myself with the SonoSite and this appeared to be a reasonable vein.  A longitudinal incision was made in the right groin.  Through this incision the common femoral, superficial femoral, and deep femoral arteries were dissected free.  There was a posterior branch well above the deep femoral artery which was injured and repaired with a 5-0 Prolene suture.  In order to clamp proximally had to go well under the inguinal ligament as it was some plaque that extended fairly high.  Next using multiple incisions along the medial aspect of the right leg leaving skin bridges between these incisions the great saphenous vein was  harvested to the mid calf.  Branches were divided between clips and 3-0 silk ties.  Through the distal incision the below-knee popliteal artery was exposed.  A tunnel was created from this incision to the groin incision.  Next the saphenofemoral junction was clamped and the vein excised from the saphenofemoral junction.  Femoral vein was oversewn with a 5-0 Prolene suture.  The proximal valve was sharply excised.  The proximal vein was spatulated.  Patient had been heparinized.  Clamps were placed on the external iliac artery, the deep femoral artery, and the superficial femoral artery.  A longitudinal arteriotomy was made in the common femoral artery over a soft spot adhered the artery was widely patent.  I did not perform endarterectomy as this would have been a very extensive endarterectomy and the plaque here did not appear to be flow-limiting.  The vein was spatulated and sewn into side to the common femoral artery using continuous 5-0 Prolene suture.  A radiopaque marker was placed around the proximal anastomosis.  Of note, prior to completing the anastomosis, the artery was backbled and flushed and there was excellent inflow.  The retrograde Farrel Demark was used to lyse the valves.  Excellent flow was established to the vein graft which was then marked to prevent twisting.  Was flushed with heparinized saline and clipped distally.  It was then brought to the previously created tunnel for anastomosis to the below-knee popliteal artery.  Tourniquet was placed on the thigh and the leg exsanguinated with an Esmarch bandage.  The tourniquet was inflated to 300 mmHg.  Under tourniquet control the below-knee popliteal artery was opened longitudinally.  The vein graft cut the appropriate length spatulated and sewn end to side to the below-knee popliteal artery using continuous 6-0 Prolene suture.  Prior to completing this anastomosis the artery was backbled and flushed appropriately and the anastomosis  completed.  Flow was reestablished to the right leg.  At this point there was a peroneal signal with the Doppler.  The heparin was partially reversed with protamine.  The groin incision was closed with a deep layer of 2-0 Vicryl, a subcutaneous layer with 3-0 Vicryl and the skin closed with 4-0 Vicryl.  The distal incision was closed with a deep layer of 2-0 Vicryl, a subcutaneous layer with 3-0 Vicryl and the skin closed with 4-0 Vicryl.  Remaining incisions were closed with a deep layer 3-0 Vicryl and the skin closed with 4-0 Vicryl.  Dermabond was applied.  Next attention was turned to the right foot.  A fishmouth incision was made encompassing the right great toe which was gangrenous.  The dissection carried down to the proximal phalanx.  This was divided using the CD 4 saw.  Hemostasis was obtained using electrocautery.  The wound was irrigated.  The skin was closed with interrupted 3-0 nylons.  The patient was transferred to the recovery room in stable condition.  All needle and sponge counts were correct.  Deitra Mayo, MD, FACS [facility]  DATE OF DICTATION:   07/11/2018

## 2018-07-11 NOTE — Progress Notes (Signed)
Inpatient Diabetes Program Recommendations  AACE/ADA: New Consensus Statement on Inpatient Glycemic Control (2015)  Target Ranges:  Prepandial:   less than 140 mg/dL      Peak postprandial:   less than 180 mg/dL (1-2 hours)      Critically ill patients:  140 - 180 mg/dL   Lab Results  Component Value Date   GLUCAP 98 07/03/2018   HGBA1C 6.8 (H) 07/11/2018    Review of Glycemic Control Results for Thomas Mcguire, Thomas Mcguire (MRN [id number]) as of 07/11/2018 15:56  Ref. Range 05/23/2018 15:01 07/03/2018 07:14 07/03/2018 11:03  Glucose-Capillary Latest Ref Range: 70 - 99 mg/dL 69 (L) 102 (H) 98   Diabetes history: Type 2 DM Outpatient Diabetes medications: NPH 50 units BID, Novolin R 20 units TID Current orders for Inpatient glycemic control: none Decadron 5 mg x1   Inpatient Diabetes Program Recommendations:   Would anticipate glucose trends to increase in the setting of steroids, however, would not recommend starting at home dose.  Consider the following if patient to remain NPO:  - Decrease NPH to 12 units BID (50% of home dose) -Add Novolog 0-9 units Q4H - Discontinue Novolog 20 units TID   Thanks, Bronson Curb, MSN, RNC-OB Diabetes Coordinator [phone] (8a-5p)

## 2018-07-11 NOTE — Progress Notes (Signed)
     Right LE incision healing well, right groin without hematoma Doppler signal peroneal and AT   S/P right fem pop bypass with GSV  Roxy Horseman PA-C

## 2018-07-11 NOTE — Anesthesia Postprocedure Evaluation (Signed)
Anesthesia Post Note  Patient: Thomas Mcguire  Procedure(s) Performed: BYPASS GRAFT FEMORAL-POPLITEAL ARTERY (Right Thigh) AMPUTATION RIGHT GREAT TOE (Right Toe)     Patient location during evaluation: PACU Anesthesia Type: General Level of consciousness: awake and alert Pain management: pain level controlled Vital Signs Assessment: post-procedure vital signs reviewed and stable Respiratory status: spontaneous breathing, nonlabored ventilation and respiratory function stable Cardiovascular status: blood pressure returned to baseline and stable Postop Assessment: no apparent nausea or vomiting Anesthetic complications: no    Last Vitals:  Vitals:   07/11/18 1535 07/11/18 1550  BP: (!) 126/43 (!) 126/43  Pulse: 62 65  Resp: 18 16  Temp:  36.6 C  SpO2: 96% 97%    Last Pain:  Vitals:   07/11/18 1535  TempSrc:   PainSc: 3                  Lidia Collum

## 2018-07-11 NOTE — Transfer of Care (Signed)
Immediate Anesthesia Transfer of Care Note  Patient: Thomas Mcguire  Procedure(s) Performed: BYPASS GRAFT FEMORAL-POPLITEAL ARTERY (Right Thigh) AMPUTATION RIGHT GREAT TOE (Right Toe)  Patient Location: PACU  Anesthesia Type:General  Level of Consciousness: drowsy  Airway & Oxygen Therapy: Patient Spontanous Breathing and Patient connected to face mask oxygen  Post-op Assessment: Report given to RN and Post -op Vital signs reviewed and stable  Post vital signs: Reviewed and stable  Last Vitals:  Vitals Value Taken Time  BP    Temp    Pulse    Resp    SpO2      Last Pain:  Vitals:   07/11/18 0817  TempSrc: Oral  PainSc:          Complications: No apparent anesthesia complications

## 2018-07-11 NOTE — Interval H&P Note (Signed)
History and Physical Interval Note:  07/11/2018 9:09 AM  Thomas Mcguire  has presented today for surgery, with the diagnosis of right great toe ischemia.  The various methods of treatment have been discussed with the patient and family. After consideration of risks, benefits and other options for treatment, the patient has consented to  Procedure(s): BYPASS GRAFT FEMORAL-POPLITEAL ARTERY (Right) AMPUTATION RIGHT GREAT TOE (Right) as a surgical intervention.  The patient's history has been reviewed, patient examined, no change in status, stable for surgery.  I have reviewed the patient's chart and labs.  Questions were answered to the patient's satisfaction.     Deitra Mayo

## 2018-07-12 ENCOUNTER — Inpatient Hospital Stay (HOSPITAL_COMMUNITY): Payer: Medicare Other

## 2018-07-12 ENCOUNTER — Encounter (HOSPITAL_COMMUNITY): Payer: Self-pay | Admitting: Vascular Surgery

## 2018-07-12 DIAGNOSIS — I739 Peripheral vascular disease, unspecified: Secondary | ICD-10-CM

## 2018-07-12 LAB — BASIC METABOLIC PANEL
Anion gap: 13 (ref 5–15)
BUN: 48 mg/dL — ABNORMAL HIGH (ref 8–23)
CO2: 20 mmol/L — ABNORMAL LOW (ref 22–32)
Calcium: 8.5 mg/dL — ABNORMAL LOW (ref 8.9–10.3)
Chloride: 104 mmol/L (ref 98–111)
Creatinine, Ser: 4.48 mg/dL — ABNORMAL HIGH (ref 0.61–1.24)
GFR calc Af Amer: 14 mL/min — ABNORMAL LOW (ref >60)
GFR calc non Af Amer: 12 mL/min — ABNORMAL LOW (ref >60)
Glucose, Bld: 167 mg/dL — ABNORMAL HIGH (ref 70–99)
Potassium: 4.8 mmol/L (ref 3.5–5.1)
Sodium: 137 mmol/L (ref 135–145)

## 2018-07-12 LAB — GLUCOSE, CAPILLARY
Glucose-Capillary: 131 mg/dL — ABNORMAL HIGH (ref 70–99)
Glucose-Capillary: 147 mg/dL — ABNORMAL HIGH (ref 70–99)
Glucose-Capillary: 152 mg/dL — ABNORMAL HIGH (ref 70–99)
Glucose-Capillary: 162 mg/dL — ABNORMAL HIGH (ref 70–99)
Glucose-Capillary: 164 mg/dL — ABNORMAL HIGH (ref 70–99)
Glucose-Capillary: 169 mg/dL — ABNORMAL HIGH (ref 70–99)

## 2018-07-12 LAB — CBC
HCT: 33.4 % — ABNORMAL LOW (ref 39.0–52.0)
Hemoglobin: 10.5 g/dL — ABNORMAL LOW (ref 13.0–17.0)
MCH: 27.1 pg (ref 26.0–34.0)
MCHC: 31.4 g/dL (ref 30.0–36.0)
MCV: 86.3 fL (ref 80.0–100.0)
Platelets: 129 10*3/uL — ABNORMAL LOW (ref 150–400)
RBC: 3.87 MIL/uL — ABNORMAL LOW (ref 4.22–5.81)
RDW: 16 % — ABNORMAL HIGH (ref 11.5–15.5)
WBC: 20.8 10*3/uL — ABNORMAL HIGH (ref 4.0–10.5)
nRBC: 0 % (ref 0.0–0.2)

## 2018-07-12 MED ORDER — INSULIN ASPART 100 UNIT/ML ~~LOC~~ SOLN
0.0000 [IU] | Freq: Three times a day (TID) | SUBCUTANEOUS | Status: DC
  Administered 2018-07-13: 06:00:00 1 [IU] via SUBCUTANEOUS
  Administered 2018-07-13 – 2018-07-14 (×2): 2 [IU] via SUBCUTANEOUS

## 2018-07-12 NOTE — Progress Notes (Signed)
Ortho tech paged for darko shoe.

## 2018-07-12 NOTE — Progress Notes (Signed)
Orthopedic Tech Progress Note Patient Details:  Thomas Mcguire [birth year] [id number]  Ortho Devices Type of Ortho Device: Darco shoe Ortho Device/Splint Location: LRE Ortho Device/Splint Interventions: Adjustment, Application, Ordered   Post Interventions Patient Tolerated: Well Instructions Provided: Care of device, Adjustment of device   Janit Pagan 07/12/2018, 8:38 AM

## 2018-07-12 NOTE — Evaluation (Signed)
Physical Therapy Evaluation Patient Details Name: Thomas Mcguire MRN: [id number] DOB: 07-Jun-1940 Today's Date: 07/12/2018   History of Present Illness  Pt is 78 yo male. 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  He had a right radiocephalic fistula placed on 05/23/2018.  He is not yet on dialysis. Pt is now s/p right femoral below-knee pop bypass with vein and right great toe amputation.   Clinical Impression  Pt admitted with above diagnosis. Pt currently with functional limitations due to the deficits listed below (see PT Problem List). Pt was able to ambualte with RW with min guard assist with good safety with RW.  Should progress and be able to go home with HHPT.  Pt will benefit from skilled PT to increase their independence and safety with mobility to allow discharge to the venue listed below.      Follow Up Recommendations Home health PT;Supervision - Intermittent    Equipment Recommendations  None recommended by PT    Recommendations for Other Services       Precautions / Restrictions Precautions Precautions: Fall Required Braces or Orthoses: (Darco shoe) Restrictions Weight Bearing Restrictions: Yes RLE Weight Bearing: Partial weight bearing RLE Partial Weight Bearing Percentage or Pounds: through heel only      Mobility  Bed Mobility Overal bed mobility: Modified Independent             General bed mobility comments: in chair on arrival  Transfers Overall transfer level: Needs assistance Equipment used: Rolling walker (2 wheeled) Transfers: Sit to/from Stand Sit to Stand: Min guard;From elevated surface         General transfer comment:  min guard for safety. extra time and effort.   Ambulation/Gait Ambulation/Gait assistance: Min guard Gait Distance (Feet): 250 Feet Assistive device: Rolling walker (2 wheeled) Gait Pattern/deviations:  Step-to pattern;Decreased stride length;Antalgic;Wide base of support   Gait velocity interpretation: 1.31 - 2.62 ft/sec, indicative of limited community ambulator General Gait Details: Pt safe with RW with gait.  No LOB.  Stairs            Wheelchair Mobility    Modified Rankin (Stroke Patients Only)       Balance Overall balance assessment: Needs assistance Sitting-balance support: No upper extremity supported;Feet supported Sitting balance-Leahy Scale: Good     Standing balance support: Bilateral upper extremity supported Standing balance-Leahy Scale: Poor Standing balance comment: fair static standing, poor dynamic standing without UE support                             Pertinent Vitals/Pain Pain Assessment: Faces Faces Pain Scale: Hurts little more Pain Location: RLE, incisional sites Pain Descriptors / Indicators: Sore Pain Intervention(s): Limited activity within patient's tolerance;Monitored during session;Repositioned    Home Living Family/patient expects to be discharged to:: Private residence Living Arrangements: Spouse/significant other Available Help at Discharge: Family Type of Home: House Home Access: Level entry     Home Layout: One level Home Equipment: Clinical cytogeneticist - 2 wheels(states he has a RW he can use)      Prior Function Level of Independence: Independent               Hand Dominance        Extremity/Trunk Assessment   Upper Extremity Assessment Upper Extremity Assessment: Defer to OT evaluation    Lower Extremity Assessment Lower Extremity Assessment: Generalized weakness  Cervical / Trunk Assessment Cervical / Trunk Assessment: Normal  Communication   Communication: No difficulties  Cognition Arousal/Alertness: Awake/alert Behavior During Therapy: WFL for tasks assessed/performed Overall Cognitive Status: Within Functional Limits for tasks assessed                                         General Comments      Exercises General Exercises - Lower Extremity Ankle Circles/Pumps: AROM;Both;10 reps;Supine Long Arc Quad: AROM;Both;10 reps;Seated   Assessment/Plan    PT Assessment Patient needs continued PT services  PT Problem List Decreased activity tolerance;Decreased balance;Decreased mobility;Decreased knowledge of use of DME;Decreased safety awareness;Decreased knowledge of precautions;Cardiopulmonary status limiting activity;Pain       PT Treatment Interventions DME instruction;Gait training;Functional mobility training;Therapeutic activities;Therapeutic exercise;Balance training;Patient/family education    PT Goals (Current goals can be found in the Care Plan section)  Acute Rehab PT Goals Patient Stated Goal: to gohome PT Goal Formulation: With patient Time For Goal Achievement: 07/26/18 Potential to Achieve Goals: Good    Frequency Min 3X/week   Barriers to discharge        Co-evaluation               AM-PAC PT "6 Clicks" Mobility  Outcome Measure Help needed turning from your back to your side while in a flat bed without using bedrails?: None Help needed moving from lying on your back to sitting on the side of a flat bed without using bedrails?: None Help needed moving to and from a bed to a chair (including a wheelchair)?: A Little Help needed standing up from a chair using your arms (e.g., wheelchair or bedside chair)?: A Little Help needed to walk in hospital room?: A Little Help needed climbing 3-5 steps with a railing? : A Little 6 Click Score: 20    End of Session Equipment Utilized During Treatment: Gait belt Activity Tolerance: Patient limited by fatigue Patient left: with call bell/phone within reach;in bed;with bed alarm set Nurse Communication: Mobility status PT Visit Diagnosis: Muscle weakness (generalized) (M62.81);Unsteadiness on feet (R26.81)    Time: 1165-7903 PT Time Calculation (min) (ACUTE ONLY): 12  min   Charges:   PT Evaluation $PT Eval Moderate Complexity: 1 Mod          Doil Kamara,PT Acute Rehabilitation Services Pager:  [phone]  Office:  [phone]    Denice Paradise 07/12/2018, 1:33 PM

## 2018-07-12 NOTE — Progress Notes (Signed)
Inpatient Diabetes Program Recommendations  AACE/ADA: New Consensus Statement on Inpatient Glycemic Control (2015)  Target Ranges:  Prepandial:   less than 140 mg/dL      Peak postprandial:   less than 180 mg/dL (1-2 hours)      Critically ill patients:  140 - 180 mg/dL   Lab Results  Component Value Date   GLUCAP 152 (H) 07/12/2018   HGBA1C 6.8 (H) 07/11/2018    Review of Glycemic Control Results for Thomas Mcguire, Thomas Mcguire (MRN [id number]) as of 07/12/2018 12:50  Ref. Range 07/12/2018 04:25 07/12/2018 08:20 07/12/2018 12:05  Glucose-Capillary Latest Ref Range: 70 - 99 mg/dL 162 (H) 131 (H) 152 (H)   Diabetes history: Type 2 DM Outpatient Diabetes medications: NPH 50 units BID, Novolin R 20 units TID Current orders for Inpatient glycemic control: Novolog 0-9 units Q4H Decadron 5 mg x1   Inpatient Diabetes Program Recommendations:    Now that patient has diet order, consider adding:   -NPH 10 units BID.  - Switch correction to Novolog 0-9 units TID and Novolog 0-5 units QHS  Thanks, Bronson Curb, MSN, RNC-OB Diabetes Coordinator [phone] (8a-5p)

## 2018-07-12 NOTE — Progress Notes (Signed)
ABI's have been completed. Preliminary results can be found in CV Proc through chart review.   07/12/18 9:51 AM Thomas Mcguire RVT

## 2018-07-12 NOTE — Progress Notes (Signed)
   VASCULAR SURGERY ASSESSMENT & PLAN:   POD 1 -right femoral below-knee pop bypass with vein and right great toe amp: Good Doppler signals in the right peroneal and posterior tibial positions.  He has peroneal runoff only.  So far the toe amp looks okay.  Daily dressing changes.  Physical therapy, partial weightbearing right foot heel only.  I have ordered a Darko shoe.   DVT PROPHYLAXIS: He is on subcu heparin  VASCULAR QUALITY INITIATIVE: He is on aspirin and a statin   SUBJECTIVE:   No complaints.  PHYSICAL EXAM:   Vitals:   07/11/18 1550 07/11/18 2032 07/12/18 0020 07/12/18 0414  BP: (!) 126/43 (!) 147/61 (!) 154/68 (!) 143/59  Pulse: 65 (!) 57 67 66  Resp: 16 14 11 17   Temp: 97.9 F (36.6 C) 98.3 F (36.8 C) 98.2 F (36.8 C) 97.8 F (36.6 C)  TempSrc:  Oral Oral Oral  SpO2: 97% 95% 95% 95%  Weight:      Height:       Right great toe amputation site so far looks good.  No significant drainage. Brisk right peroneal and posterior tibial signal with the Doppler. His incisions look fine.  LABS:   Lab Results  Component Value Date   WBC 20.8 (H) 07/12/2018   HGB 10.5 (L) 07/12/2018   HCT 33.4 (L) 07/12/2018   MCV 86.3 07/12/2018   PLT 129 (L) 07/12/2018   Lab Results  Component Value Date   CREATININE 4.48 (H) 07/12/2018   CBG (last 3)  Recent Labs    07/11/18 2026 07/12/18 0016 07/12/18 0425  GLUCAP 165* 169* 162*    PROBLEM LIST:    Active Problems:   PAD (peripheral artery disease) (HCC)   CURRENT MEDS:   . allopurinol  300 mg Oral Daily  . amLODipine  5 mg Oral Daily  . aspirin EC  81 mg Oral Daily  . calcitRIOL  0.25 mcg Oral Daily  . carvedilol  12.5 mg Oral BID WC  . docusate sodium  100 mg Oral Daily  . doxazosin  2 mg Oral BH-q7a  . ferrous sulfate  325 mg Oral BID WC  . folic acid  1 mg Oral Daily  . furosemide  40 mg Oral BID  . heparin  5,000 Units Subcutaneous Q8H  . insulin aspart  0-9 Units Subcutaneous Q4H  . isosorbide  mononitrate  30 mg Oral Daily  . pantoprazole  40 mg Oral Daily  . potassium chloride  10 mEq Oral Daily  . pravastatin  40 mg Oral QHS    Deitra Mayo Beeper: [phone] Office: [phone] 07/12/2018

## 2018-07-12 NOTE — Evaluation (Signed)
Occupational Therapy Evaluation Patient Details Name: Thomas Mcguire MRN: [id number] DOB: July 28, 1940 Today's Date: 07/12/2018    History of Present Illness Pt is 78 yo male. 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  He had a right radiocephalic fistula placed on 05/23/2018.  He is not yet on dialysis. Pt is now s/p right femoral below-knee pop bypass with vein and right great toe amputation.    Clinical Impression   Pt admitted with the above diagnoses and presents with below problem list. Pt will benefit from continued acute OT to address the below listed deficits and maximize independence with basic ADLs prior to d/c to venue below. PTA pt was independent with ADLs. Pt is currently min guard with LB ADLs and functional transfers/mobility. Pt tolerated session well.      Follow Up Recommendations  Home health OT;Supervision - Intermittent    Equipment Recommendations  3 in 1 bedside commode    Recommendations for Other Services PT consult     Precautions / Restrictions Precautions Precautions: Fall Required Braces or Orthoses: (Darco shoe) Restrictions Weight Bearing Restrictions: Yes RLE Weight Bearing: Partial weight bearing RLE Partial Weight Bearing Percentage or Pounds: through heel only      Mobility Bed Mobility Overal bed mobility: Modified Independent                Transfers Overall transfer level: Needs assistance Equipment used: Rolling walker (2 wheeled) Transfers: Sit to/from Stand Sit to Stand: Min guard;From elevated surface         General transfer comment: From elevated bed height per pt request. min guard for safety. extra time and effort. From EOB to recliner with pillow in seat to boost height    Balance Overall balance assessment: Needs assistance Sitting-balance support: No upper extremity supported;Feet supported Sitting  balance-Leahy Scale: Good     Standing balance support: Bilateral upper extremity supported Standing balance-Leahy Scale: Poor Standing balance comment: fair static standing, poor dynamic standing                           ADL either performed or assessed with clinical judgement   ADL Overall ADL's : Needs assistance/impaired Eating/Feeding: Set up;Sitting   Grooming: Set up;Sitting   Upper Body Bathing: Set up;Sitting   Lower Body Bathing: Min guard;Sit to/from stand   Upper Body Dressing : Set up;Sitting   Lower Body Dressing: Min guard;Sit to/from stand   Toilet Transfer: Min guard;Comfort height toilet;RW;Grab bars   Toileting- Water quality scientist and Hygiene: Min guard;Sit to/from stand   Tub/ Shower Transfer: Min guard;Ambulation;Shower seat;Rolling walker   Functional mobility during ADLs: Min guard;Rolling walker General ADL Comments: Pt completed bed mobility, in room functional mobility, in recliner at end of session.     Vision         Perception     Praxis      Pertinent Vitals/Pain Pain Assessment: Faces Faces Pain Scale: Hurts little more Pain Location: RLE, incisional sites Pain Descriptors / Indicators: Sore Pain Intervention(s): Limited activity within patient's tolerance;Monitored during session;Repositioned;Patient requesting pain meds-RN notified     Hand Dominance     Extremity/Trunk Assessment Upper Extremity Assessment Upper Extremity Assessment: Overall WFL for tasks assessed   Lower Extremity Assessment Lower Extremity Assessment: Defer to PT evaluation       Communication Communication Communication: No difficulties   Cognition Arousal/Alertness: Awake/alert Behavior During Therapy:  WFL for tasks assessed/performed Overall Cognitive Status: Within Functional Limits for tasks assessed                                     General Comments       Exercises     Shoulder Instructions      Home  Living Family/patient expects to be discharged to:: Private residence Living Arrangements: Spouse/significant other Available Help at Discharge: Family Type of Home: House Home Access: Level entry     Home Layout: One level     Bathroom Shower/Tub: Tub/shower unit         Home Equipment: Shower seat          Prior Functioning/Environment Level of Independence: Independent                 OT Problem List: Impaired balance (sitting and/or standing);Decreased knowledge of use of DME or AE;Decreased knowledge of precautions;Pain      OT Treatment/Interventions: Self-care/ADL training;DME and/or AE instruction;Therapeutic activities;Patient/family education;Balance training    OT Goals(Current goals can be found in the care plan section) Acute Rehab OT Goals OT Goal Formulation: With patient Time For Goal Achievement: 07/19/18 Potential to Achieve Goals: Good ADL Goals Pt Will Perform Lower Body Bathing: with modified independence;sit to/from stand Pt Will Perform Lower Body Dressing: with modified independence;sit to/from stand Pt Will Transfer to Toilet: with modified independence;ambulating Pt Will Perform Toileting - Clothing Manipulation and hygiene: with modified independence;sit to/from stand Pt Will Perform Tub/Shower Transfer: Tub transfer;with supervision;ambulating;shower seat;3 in 1;rolling walker  OT Frequency: Min 2X/week   Barriers to D/C:            Co-evaluation              AM-PAC OT "6 Clicks" Daily Activity     Outcome Measure Help from another person eating meals?: None Help from another person taking care of personal grooming?: None Help from another person toileting, which includes using toliet, bedpan, or urinal?: None Help from another person bathing (including washing, rinsing, drying)?: A Little Help from another person to put on and taking off regular upper body clothing?: None Help from another person to put on and taking off  regular lower body clothing?: None 6 Click Score: 23   End of Session Equipment Utilized During Treatment: Rolling walker Nurse Communication: Patient requests pain meds  Activity Tolerance: Patient tolerated treatment well Patient left: in chair;with call bell/phone within reach  OT Visit Diagnosis: Unsteadiness on feet (R26.81);Pain Pain - Right/Left: Right Pain - part of body: Leg;Ankle and joints of foot                Time: 1007-1031 OT Time Calculation (min): 24 min Charges:  OT General Charges $OT Visit: 1 Visit OT Evaluation $OT Eval Low Complexity: 1 Low OT Treatments $Self Care/Home Management : 8-22 mins  Tyrone Schimke, OT Acute Rehabilitation Services Pager: [phone] Office: [phone]   Hortencia Pilar 07/12/2018, 10:52 AM

## 2018-07-13 LAB — GLUCOSE, CAPILLARY
Glucose-Capillary: 143 mg/dL — ABNORMAL HIGH (ref 70–99)
Glucose-Capillary: 128 mg/dL — ABNORMAL HIGH (ref 70–99)
Glucose-Capillary: 176 mg/dL — ABNORMAL HIGH (ref 70–99)
Glucose-Capillary: 136 mg/dL — ABNORMAL HIGH (ref 70–99)

## 2018-07-13 NOTE — Care Management Important Message (Signed)
Important Message  Patient Details  Name: Thomas Mcguire MRN: [id number] Date of Birth: September 08, 1940   Medicare Important Message Given:  Yes     Shelda Altes 07/13/2018, 2:30 PM

## 2018-07-13 NOTE — Progress Notes (Addendum)
   VASCULAR SURGERY ASSESSMENT & PLAN:   POD 2 -right femoral below-knee pop bypass with vein and right great toe amp: Good Doppler signals right foot.  He has peroneal runoff only.  So far the toe amp looks okay.  Continueaily dressing changes.  He will need to continue dry dressing changes at home daily to protect the wound.  Physical therapy, partial weightbearing right foot heel only.  PHYSICAL THERAPY HAS RECOMMENDED HOME HEALTH PHYSICAL THERAPY.  DVT PROPHYLAXIS: He is on subcu heparin.  VASCULAR QUALITY INITIATIVE: He is on aspirin and a statin.    SUBJECTIVE:   Needs another day of physical therapy.  PHYSICAL EXAM:   Vitals:   07/12/18 1627 07/12/18 2100 07/13/18 0040 07/13/18 0417  BP: (!) 146/60 (!) 148/60 (!) 147/68 (!) 156/58  Pulse: (!) 58 (!) 55 62 64  Resp: 12 14 12 14   Temp: 97.9 F (36.6 C) 97.7 F (36.5 C) 97.8 F (36.6 C) 98 F (36.7 C)  TempSrc: Oral Oral Oral Oral  SpO2: 97% 96% 95% 96%  Weight:      Height:       Brisk peroneal, dorsalis pedis, and posterior tibial signal with the Doppler. So far the right great toe amputation site is healing adequately.  I changed his dressing. All of his incisions look fine.  LABS:   Lab Results  Component Value Date   WBC 20.8 (H) 07/12/2018   HGB 10.5 (L) 07/12/2018   HCT 33.4 (L) 07/12/2018   MCV 86.3 07/12/2018   PLT 129 (L) 07/12/2018   Lab Results  Component Value Date   CREATININE 4.48 (H) 07/12/2018    CBG (last 3)  Recent Labs    07/12/18 1632 07/12/18 2220 07/13/18 0619  GLUCAP 164* 147* 143*    PROBLEM LIST:    Active Problems:   PAD (peripheral artery disease) (HCC)   CURRENT MEDS:   . allopurinol  300 mg Oral Daily  . amLODipine  5 mg Oral Daily  . aspirin EC  81 mg Oral Daily  . calcitRIOL  0.25 mcg Oral Daily  . carvedilol  12.5 mg Oral BID WC  . docusate sodium  100 mg Oral Daily  . doxazosin  2 mg Oral BH-q7a  . ferrous sulfate  325 mg Oral BID WC  . folic  acid  1 mg Oral Daily  . furosemide  40 mg Oral BID  . heparin  5,000 Units Subcutaneous Q8H  . insulin aspart  0-9 Units Subcutaneous TID WC  . isosorbide mononitrate  30 mg Oral Daily  . pantoprazole  40 mg Oral Daily  . potassium chloride  10 mEq Oral Daily  . pravastatin  40 mg Oral QHS    Deitra Mayo Beeper: [phone] Office: [phone] 07/13/2018

## 2018-07-13 NOTE — Progress Notes (Signed)
Physical Therapy Treatment Patient Details Name: Thomas Mcguire MRN: [id number] DOB: [birth year] Today's Date: 07/13/2018    History of Present Illness Pt is 78 yo male. 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  He had a right radiocephalic fistula placed on 05/23/2018.  He is not yet on dialysis. Pt is now s/p right femoral below-knee pop bypass with vein and right great toe amputation.     PT Comments    Pt making good progress with mobility. He is hopeful for DC tomorrow. Do not see any issues with mobility that would prevent this. I have answered all patient's question regarding PT and mobility.  I have encouraged the patient to gradually increase activity daily to tolerance once at home.     Follow Up Recommendations        Equipment Recommendations       Recommendations for Other Services       Precautions / Restrictions Precautions Precautions: Fall Restrictions Weight Bearing Restrictions: Yes RLE Weight Bearing: Partial weight bearing RLE Partial Weight Bearing Percentage or Pounds: through heel only Other Position/Activity Restrictions: Use Darco shoe    Mobility  Bed Mobility Overal bed mobility: Modified Independent                Transfers Overall transfer level: Needs assistance Equipment used: Rolling walker (2 wheeled) Transfers: Sit to/from Stand Sit to Stand: Supervision         General transfer comment: demonstrated proper and safe technique  Ambulation/Gait Ambulation/Gait assistance: Supervision Gait Distance (Feet): 300 Feet Assistive device: Rolling walker (2 wheeled) Gait Pattern/deviations: Step-to pattern;Decreased stride length;Antalgic;Wide base of support     General Gait Details: no balance losses. Using arms to keep some weight off RLE(used Darco shoe)   Stairs             Wheelchair Mobility     Modified Rankin (Stroke Patients Only)       Balance                                            Cognition Arousal/Alertness: Awake/alert Behavior During Therapy: WFL for tasks assessed/performed Overall Cognitive Status: Within Functional Limits for tasks assessed                                        Exercises General Exercises - Lower Extremity Ankle Circles/Pumps: AROM;Both;5 reps    General Comments General comments (skin integrity, edema, etc.): pt able to open door to room, able to stnad and remove both hands from walker to don face mask. Pt wore face mask while ambulating in hallway      Pertinent Vitals/Pain Pain Assessment: 0-10 Pain Score: 6  Pain Location: RLE, incisional sites, especially in the groin Pain Descriptors / Indicators: Sore;Tightness Pain Intervention(s): Limited activity within patient's tolerance;Premedicated before session    Home Living                      Prior Function            PT Goals (current goals can now be found in the care plan section) Acute Rehab PT Goals Patient Stated Goal: to go home, likely tomorrow Progress towards PT goals:  Progressing toward goals    Frequency           PT Plan Current plan remains appropriate    Co-evaluation              AM-PAC PT "6 Clicks" Mobility   Outcome Measure  Help needed turning from your back to your side while in a flat bed without using bedrails?: None Help needed moving from lying on your back to sitting on the side of a flat bed without using bedrails?: None Help needed moving to and from a bed to a chair (including a wheelchair)?: None Help needed standing up from a chair using your arms (e.g., wheelchair or bedside chair)?: A Little Help needed to walk in hospital room?: A Little Help needed climbing 3-5 steps with a railing? : A Little 6 Click Score: 21    End of Session Equipment Utilized During Treatment: Gait  belt Activity Tolerance: Patient tolerated treatment well Patient left: in chair;with call bell/phone within reach Nurse Communication: Mobility status PT Visit Diagnosis: Muscle weakness (generalized) (M62.81);Unsteadiness on feet (R26.81)     Time: 3382-5053 PT Time Calculation (min) (ACUTE ONLY): 21 min  Charges:  $Gait Training: 8-22 mins                     Lavonia Dana, PT   Acute Rehabilitation Services  Pager [phone] Office [phone] 07/13/2018    [name] 07/13/2018, 12:24 PM

## 2018-07-14 LAB — GLUCOSE, CAPILLARY: Glucose-Capillary: 154 mg/dL — ABNORMAL HIGH (ref 70–99)

## 2018-07-14 MED ORDER — OXYCODONE HCL 5 MG PO TABS: 5.0000 mg | ORAL_TABLET | ORAL | 0 refills | Status: DC | PRN

## 2018-07-14 MED ORDER — OXYCODONE HCL 5 MG PO TABS
5.0000 mg | ORAL_TABLET | ORAL | Status: DC | PRN
  Administered 2018-07-14: 10 mg via ORAL
  Filled 2018-07-14: qty 2

## 2018-07-14 NOTE — Progress Notes (Signed)
Occupational Therapy Treatment Patient Details Name: Thomas Mcguire MRN: [id number] DOB: [birth year] Today's Date: 07/14/2018    History of present illness Pt is 78 yo male. 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  He had a right radiocephalic fistula placed on 05/23/2018.  He is not yet on dialysis. Pt is now s/p right femoral below-knee pop bypass with vein and right great toe amputation.    OT comments  Pt at supervision level with LB ADLs and functional mobility with RW within room.  Therapist educated pt on home safety awareness strategies during ADLs and with functional mobility. Pt anticipates discharge home later today which therapist continues to recommend as discharge plan.  Will continue to follow acutely.  Follow Up Recommendations  Home health OT;Supervision - Intermittent    Equipment Recommendations  3 in 1 bedside commode    Recommendations for Other Services      Precautions / Restrictions Precautions Precautions: Fall Restrictions Weight Bearing Restrictions: Yes RLE Weight Bearing: Partial weight bearing RLE Partial Weight Bearing Percentage or Pounds: through heel only       Mobility Bed Mobility Overal bed mobility: Modified Independent                Transfers Overall transfer level: Needs assistance Equipment used: Rolling walker (2 wheeled) Transfers: Sit to/from Stand Sit to Stand: Supervision         General transfer comment: independent with correct hand placement    Balance                                           ADL either performed or assessed with clinical judgement   ADL Overall ADL's : Needs assistance/impaired                     Lower Body Dressing: Supervision/safety;Sit to/from stand   Toilet Transfer: Supervision/safety;RW;Ambulation Armed forces technical officer Details (indicate cue type  and reason): simulated         Functional mobility during ADLs: Supervision/safety;Rolling walker General ADL Comments: Pt dressed in prep for discharge upon OT arrival. Therapist reviewed LB dressing techniques and pt verbalized understanding. Pt abel to demonstrate donning socks for therapist. Therapist educated pt on home safety awareness techniques/strategies, especially with use of RW.     Vision       Perception     Praxis      Cognition Arousal/Alertness: Awake/alert Behavior During Therapy: WFL for tasks assessed/performed Overall Cognitive Status: Within Functional Limits for tasks assessed                                          Exercises     Shoulder Instructions       General Comments      Pertinent Vitals/ Pain       Pain Assessment: Faces Faces Pain Scale: Hurts a little bit Pain Location: RLE, incisional sites, especially in the groin Pain Descriptors / Indicators: Sore;Tightness Pain Intervention(s): Limited activity within patient's tolerance;Monitored during session  Home Living  Prior Functioning/Environment              Frequency  Min 2X/week        Progress Toward Goals  OT Goals(current goals can now be found in the care plan section)  Progress towards OT goals: Progressing toward goals  Acute Rehab OT Goals Patient Stated Goal: to go home, likely tomorrow OT Goal Formulation: With patient Time For Goal Achievement: 07/19/18 Potential to Achieve Goals: Good ADL Goals Pt Will Perform Lower Body Bathing: with modified independence;sit to/from stand Pt Will Perform Lower Body Dressing: with modified independence;sit to/from stand Pt Will Transfer to Toilet: with modified independence;ambulating Pt Will Perform Toileting - Clothing Manipulation and hygiene: with modified independence;sit to/from stand Pt Will Perform Tub/Shower Transfer: Tub transfer;with  supervision;ambulating;shower seat;3 in 1;rolling walker  Plan Discharge plan remains appropriate    Co-evaluation                 AM-PAC OT "6 Clicks" Daily Activity     Outcome Measure   Help from another person eating meals?: None Help from another person taking care of personal grooming?: None Help from another person toileting, which includes using toliet, bedpan, or urinal?: None Help from another person bathing (including washing, rinsing, drying)?: None Help from another person to put on and taking off regular upper body clothing?: None Help from another person to put on and taking off regular lower body clothing?: None 6 Click Score: 24    End of Session Equipment Utilized During Treatment: Rolling walker  OT Visit Diagnosis: Unsteadiness on feet (R26.81);Pain Pain - Right/Left: Right Pain - part of body: Leg;Ankle and joints of foot   Activity Tolerance Patient tolerated treatment well   Patient Left in bed;with call bell/phone within reach   Nurse Communication Mobility status        Time: 3612-2449 OT Time Calculation (min): 12 min  Charges: OT General Charges $OT Visit: 1 Visit OT Treatments $Self Care/Home Management : 8-22 mins    Darrol Jump OTR/L 07/14/2018, 12:06 PM

## 2018-07-14 NOTE — Discharge Instructions (Signed)
 [facility] [place]  Discharge instructions  Lower Extremity Bypass Surgery  Please refer to the following instruction for your post-procedure care. Your surgeon or physician assistant will discuss any changes with you.  Activity  You are encouraged to walk as much as you can. You can slowly return to normal activities during the month after your surgery. Avoid strenuous activity and heavy lifting until your doctor tells you it's OK. Avoid activities such as vacuuming or swinging a golf club. Do not drive until your doctor give the OK and you are no longer taking prescription pain medications. It is also normal to have difficulty with sleep habits, eating and bowel movement after surgery. These will go away with time.  Bathing/Showering  You may shower after you go home. Do not soak in a bathtub, hot tub, or swim until the incision heals completely.  Incision Care  Clean your incision with mild soap and water. Shower every day. Pat the area dry with a clean towel. You do not need a bandage unless otherwise instructed. Do not apply any ointments or creams to your incision. If you have open wounds you will be instructed how to care for them or a visiting nurse may be arranged for you. If you have staples or sutures along your incision they will be removed at your post-op appointment. You may have skin glue on your incision. Do not peel it off. It will come off on its own in about one week. If you have a great deal of moisture in your groin, use a gauze help keep this area dry.  Diet  Resume your normal diet. There are no special food restrictions following this procedure. A low fat/ low cholesterol diet is recommended for all patients with vascular disease. In order to heal from your surgery, it is CRITICAL to get adequate nutrition. Your body requires vitamins, minerals, and protein. Vegetables are the best source of vitamins and minerals. Vegetables also provide the  perfect balance of protein. Processed food has little nutritional value, so try to avoid this.  Medications  Resume taking all your medications unless your doctor or nurse practitioner tells you not to. If your incision is causing pain, you may take over-the-counter pain relievers such as acetaminophen (Tylenol). If you were prescribed a stronger pain medication, please aware these medication can cause nausea and constipation. Prevent nausea by taking the medication with a snack or meal. Avoid constipation by drinking plenty of fluids and eating foods with high amount of fiber, such as fruits, vegetables, and grains. Take Colase 100 mg (an over-the-counter stool softener) twice a day as needed for constipation. Do not take Tylenol if you are taking prescription pain medications.  Follow Up  Our office will schedule a follow up appointment 2-3 weeks following discharge.  Please call us immediately for any of the following conditions  Severe or worsening pain in your legs or feet while at rest or while walking Increase pain, redness, warmth, or drainage (pus) from your incision site(s) Fever of 101 degree or higher The swelling in your leg with the bypass suddenly worsens and becomes more painful than when you were in the hospital If you have been instructed to feel your graft pulse then you should do so every day. If you can no longer feel this pulse, call the office immediately. Not all patients are given this instruction.  Leg swelling is common after leg bypass surgery.  The swelling should improve over a few months   following surgery. To improve the swelling, you may elevate your legs above the level of your heart while you are sitting or resting. Your surgeon or physician assistant may ask you to apply an ACE wrap or wear compression (TED) stockings to help to reduce swelling.  Reduce your risk of vascular disease  Stop smoking. If you would like help call [facility] at 1-800-QUIT-NOW  (1-800-784-8669) or [place] at [phone].  Manage your cholesterol Maintain a desired weight Control your diabetes weight Control your diabetes Keep your blood pressure down  If you have any questions, please call the office at [phone]   

## 2018-07-14 NOTE — TOC Transition Note (Signed)
Transition of Care [facility]) - CM/SW Discharge Note Marvetta Gibbons RN, BSN Transitions of Care Unit 4E- RN Case Manager [phone]   Patient Details  Name: Thomas Mcguire MRN: [id number] Date of Birth: 01-27-1940  Transition of Care [facility]) CM/SW Contact:  Dawayne Patricia, RN Phone Number: 07/14/2018, 10:57 AM   Clinical Narrative:    Pt admitted s/p fempop bypass, stable for transition home today, CM was notified by Tiffany with [facility] they have pre-op referral for [facility] needs- order written for HHPT. CM spoke with pt regarding referral and HH choice- pt states he has already heard from [facility] and is ok with using them for his [facility] services. Notified Tiffany with [facility] of pt's discharge home today they will f/u for [facility] needs. Pt reports his already has needed DME at home including shower chair and RW.    Final next level of care: [place] Barriers to Discharge: No Barriers Identified   Patient Goals and CMS Choice Patient states their goals for this hospitalization and ongoing recovery are:: "to get better" CMS Medicare.gov Compare Post Acute Care list provided to:: Patient Choice offered to / list presented to : Patient  Discharge Placement  Home with [facility]                     Discharge Plan and Services                DME Arranged: N/A DME Agency: NA       HH Arranged: PT HH Agency: [facility] Date HH Agency Contacted: 07/14/18 Time [place]: 0712 Representative spoke with at [place]: [place] ([place]) Interventions     Readmission Risk Interventions Readmission Risk Prevention Plan 07/14/2018  Transportation Screening Complete  PCP or Specialist Appt within 5-7 Days Complete  Home Care Screening Complete  Medication Review (RN CM) Complete  Some recent data might be hidden

## 2018-07-14 NOTE — Progress Notes (Addendum)
     Subjective  - Doing well and is ready to go home.   Objective (!) 157/56 68 98.7 F (37.1 C) (Oral) 18 100%  Intake/Output Summary (Last 24 hours) at 07/14/2018 0833 Last data filed at 07/14/2018 0750 Gross per 24 hour  Intake 222 ml  Output 1025 ml  Net -803 ml    Right doppler signals intact Incisions healing well, toe amputation site healing well dry dressing as needed. Lungs non labored breathing  Assessment/Planning: POD # 3 right femoral below-knee pop bypass with vein and right great toe amp  Continue dry dressing changes at home daily to protect the wound. Continue aspirin and a statin. Stable for discharge today.  HH PT ordered. F/U with Dr. Scot Dock in 2-3 weeks.  Roxy Horseman 07/14/2018 8:33 AM --  Laboratory Lab Results: Recent Labs    07/12/18 0444  WBC 20.8*  HGB 10.5*  HCT 33.4*  PLT 129*   BMET Recent Labs    07/12/18 0444  NA 137  K 4.8  CL 104  CO2 20*  GLUCOSE 167*  BUN 48*  CREATININE 4.48*  CALCIUM 8.5*    COAG Lab Results  Component Value Date   INR 1.2 07/11/2018   INR 1.18 08/24/2011   No results found for: PTT    I have interviewed and examined patient with PA and agree with assessment and plan above.   Katja Blue C. Donzetta Matters, MD [facility] [place] Office: [phone] Pager: [phone]

## 2018-07-26 ENCOUNTER — Other Ambulatory Visit: Payer: Self-pay

## 2018-07-26 DIAGNOSIS — N185 Chronic kidney disease, stage 5: Secondary | ICD-10-CM

## 2018-07-26 DIAGNOSIS — R0989 Other specified symptoms and signs involving the circulatory and respiratory systems: Secondary | ICD-10-CM

## 2018-07-31 NOTE — Discharge Summary (Signed)
[facility] Discharge Summary   Patient ID:  Thomas Mcguire MRN: [id number] DOB/AGE: Dec 06, 1940 78 y.o.  Admit date: 07/11/2018 Discharge date: 07/14/2018 Date of Surgery: 07/11/2018 Surgeon: Surgeon(s): Angelia Mould, MD  Admission Diagnosis: right great toe ischemia  Discharge Diagnoses:  right great toe ischemia  Secondary Diagnoses: Past Medical History:  Diagnosis Date  . Anemia    low iron  . Arthritis   . Chronic renal insufficiency    CHECKED Q3MOS PER PT. Stage 4 as of 05/22/2018 per pt.  . Diabetes mellitus    type 2  . Gout    Has not had recently- 08/24/11  . Hyperlipidemia   . Hypertension   . 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 PAD (peripheral artery disease) ([place])   . 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  He has a barely audible dorsalis pedis and posterior tibial signal with the Doppler on the right.  This is clearly a limb threatening situation.  At that time, on the right side he had a monophasic dorsalis pedis and posterior tibial signal with an ABI of 99% although this was likely falsely elevated.  Toe pressure was 47 mmHg.  On the left side he had a monophasic dorsalis pedis and posterior tibial signal.  ABI was 68%.  Toe pressure could not be obtained.  His arteries were noted to be calcified and noncompressible.   After consideration of risks, benefits and other options for treatment, the patient has consented to  Procedure(s): BYPASS GRAFT FEMORAL-POPLITEAL ARTERY (Right) AMPUTATION RIGHT GREAT TOE (Right) as a surgical intervention.   Hospital Course:  Thomas Mcguire is a 78 y.o. male is S/P Right Procedure(s): BYPASS  GRAFT FEMORAL-POPLITEAL ARTERY AMPUTATION RIGHT GREAT TOE  Gained peroneal signal post op.  Dry dressing changes daily to toe amputation site. Continue aspirin and a statin. Stable for discharge today.  HH PT ordered. F/U with Dr. Scot Dock in 2-3 weeks.   Consults:  Treatment Team:  Angelia Mould, MD  Significant Diagnostic Studies: CBC Lab Results  Component Value Date   WBC 20.8 (H) 07/12/2018   HGB 10.5 (L) 07/12/2018   HCT 33.4 (L) 07/12/2018   MCV 86.3 07/12/2018   PLT 129 (L) 07/12/2018    BMET    Component Value Date/Time   NA 137 07/12/2018 0444   K 4.8 07/12/2018 0444   CL 104 07/12/2018 0444   CO2 20 (L) 07/12/2018 0444   GLUCOSE 167 (H) 07/12/2018 0444   BUN 48 (H) 07/12/2018 0444   CREATININE 4.48 (H) 07/12/2018 0444   CREATININE 5.72 (HH) 12/28/2017 1009   CALCIUM 8.5 (L) 07/12/2018 0444   GFRNONAA 12 (L) 07/12/2018 0444   GFRNONAA 9 (L) 12/28/2017 1009   GFRAA 14 (L) 07/12/2018 0444   GFRAA 10 (L) 12/28/2017 1009   COAG Lab Results  Component Value Date   INR 1.2 07/11/2018   INR 1.18 08/24/2011     Disposition:  Discharge to :[place] use --- Instructions: Press F2 to tab through selections.  Delete question if not applicable.   Post-op:  Wound infection: No  Graft infection: No  Transfusion: No  If yes, 0  units given New Arrhythmia: No Ipsilateral  amputation: [ ]  no, [x ] Minor, [ ]  BKA, [ ]  AKA Discharge patency: [x ] Primary, [ ]  Primary assisted, [ ]  Secondary, [ ]  Occluded Patency judged by: [x ] Dopper only, [ ]  Palpable graft pulse, [ ]  Palpable distal pulse, [ ]  ABI inc. > 0.15, [ ]  Duplex Discharge ABI: R >1, L >1 Discharge TBI: R 0, L 0 D/C Ambulatory Status: Ambulatory with Assistance  Complications: MI: [x ] No, [ ]  Troponin only, [ ]  EKG or Clinical CHF: No Resp failure: [x ] none, [ ]  Pneumonia, [ ]  Ventilator Chg in renal function: [x ] none, [ ]  Inc. Cr > 0.5, [ ]  Temp. Dialysis, [ ]  Permanent  dialysis Stroke: [x ] None, [ ]  Minor, [ ]  Major Return to OR: No  Reason for return to OR: [ ]  Bleeding, [ ]  Infection, [ ]  Thrombosis, [ ]  Revision  Discharge medications: Statin use:  Yes ASA use:  Yes Plavix use:  No  for medical reason   Beta blocker use: Yes Coumadin use: No  for medical reason     Discharge Instructions    Call MD for:  redness, tenderness, or signs of infection (pain, swelling, bleeding, redness, odor or green/yellow discharge around incision site)   Complete by: As directed    Call MD for:  severe or increased pain, loss or decreased feeling  in affected limb(s)   Complete by: As directed    Call MD for:  temperature >100.5   Complete by: As directed    Discharge instructions   Complete by: As directed    Dry dressing as needed to great toe and incisions on the leg.   Resume previous diet   Complete by: As directed      Allergies as of 07/14/2018   No Known Allergies     Medication List    STOP taking these medications   HYDROcodone-acetaminophen 5-325 MG tablet Commonly known as: NORCO/VICODIN     TAKE these medications   allopurinol 300 MG tablet Commonly known as: ZYLOPRIM Take 300 mg by mouth daily.   amLODipine 5 MG tablet Commonly known as: NORVASC Take 5 mg by mouth daily.   aspirin 81 MG tablet Take 81 mg by mouth daily.   Bactrim DS 800-160 MG tablet Generic drug: sulfamethoxazole-trimethoprim Take 1 tablet by mouth 2 (two) times a day.   calcitRIOL 0.25 MCG capsule Commonly known as: ROCALTROL Take 0.25 mcg by mouth daily.   carvedilol 25 MG tablet Commonly known as: COREG Take 12.5 mg by mouth 2 (two) times daily with a meal.   COD LIVER OIL PO Take 1 capsule by mouth 2 (two) times daily.   cyanocobalamin 1000 MCG/ML injection Commonly known as: (VITAMIN B-12) Vitamin B12 injection: 1000 mcg (1 mL) by subcu injection once daily for 7 days; then once weekly for 4 weeks; then once monthly thereafter.  7 07/15/2010 What  changed:   how much to take  how to take this  when to take this  additional instructions   doxazosin 2 MG tablet Commonly known as: CARDURA Take 2 mg by mouth every morning.   ferrous sulfate 325 (65 FE) MG tablet Take 325 mg by mouth 2 (two) times daily with a meal.   folic acid 1 MG tablet Commonly known as: FOLVITE Take 1 tablet (1 mg total) by mouth daily.   furosemide 40 MG tablet Commonly known as: LASIX Take 40 mg by mouth 2 (two) times daily.  insulin NPH Human 100 UNIT/ML injection Commonly known as: NOVOLIN N Inject 50 Units into the skin 2 (two) times daily.   insulin regular 100 units/mL injection Commonly known as: NOVOLIN R Inject 20-35 Units into the skin 3 (three) times daily before meals. Sliding Scale   isosorbide mononitrate 30 MG 24 hr tablet Commonly known as: IMDUR Take 30 mg by mouth daily.   oxyCODONE 5 MG immediate release tablet Commonly known as: Oxy IR/ROXICODONE Take 1-2 tablets (5-10 mg total) by mouth every 4 (four) hours as needed for moderate pain.   potassium chloride 10 MEQ tablet Commonly known as: K-DUR Take 10 mEq by mouth daily.   pravastatin 40 MG tablet Commonly known as: PRAVACHOL Take 40 mg by mouth at bedtime.      Verbal and written Discharge instructions given to the patient. Wound care per Discharge AVS Follow-up Information    Angelia Mould, MD Follow up in 2 week(s).   Specialties: Vascular Surgery, Cardiology Why: office will call Contact information: [place] [address] [phone]        [facility] Follow up.   Specialty: [place] Why: HHPT - pre op referral - they will f/u with you post discharge Contact information: [place] [address] [phone]           Signed: Roxy Horseman 07/31/2018, 10:22 AM

## 2018-08-01 ENCOUNTER — Telehealth (HOSPITAL_COMMUNITY): Payer: Self-pay | Admitting: *Deleted

## 2018-08-01 NOTE — Telephone Encounter (Signed)
The above patient or their representative was contacted and gave the following answers to these questions:         Do you have any of the following symptoms?n  Fever                    Cough                   Shortness of breath  Do  you have any of the following other symptoms? n   muscle pain         vomiting,        diarrhea        rash         weakness        red eye        abdominal pain         bruising          bruising or bleeding              joint pain           severe headache    Have you been in contact with someone who was or has been sick in the past 2 weeks?n  Yes                 Unsure                         Unable to assess   Does the person that you were in contact with have any of the following symptoms?   Cough         shortness of breath           muscle pain         vomiting,            diarrhea            rash            weakness           fever            red eye           abdominal pain           bruising  or  bleeding                joint pain                severe headache               Have you  or someone you have been in contact with traveled internationally in th last month?   n      If yes, which countries?   Have you  or someone you have been in contact with traveled outside [place] in th last month?   n      If yes, which state and city?   COMMENTS OR ACTION PLAN FOR THIS PATIENT:         

## 2018-08-02 ENCOUNTER — Encounter: Payer: Self-pay | Admitting: Vascular Surgery

## 2018-08-02 ENCOUNTER — Encounter (HOSPITAL_COMMUNITY): Payer: Medicare Other

## 2018-08-02 ENCOUNTER — Other Ambulatory Visit: Payer: Self-pay | Admitting: *Deleted

## 2018-08-02 ENCOUNTER — Ambulatory Visit (HOSPITAL_COMMUNITY)
Admission: RE | Admit: 2018-08-02 | Discharge: 2018-08-02 | Disposition: A | Discharge status: Home / Self Care | Payer: Medicare Other | Source: Ambulatory Visit | Attending: Vascular Surgery | Admitting: Vascular Surgery

## 2018-08-02 ENCOUNTER — Ambulatory Visit (INDEPENDENT_AMBULATORY_CARE_PROVIDER_SITE_OTHER): Payer: Medicare Other | Admitting: Vascular Surgery

## 2018-08-02 ENCOUNTER — Ambulatory Visit: Payer: Medicare Other | Admitting: Vascular Surgery

## 2018-08-02 ENCOUNTER — Other Ambulatory Visit: Payer: Self-pay

## 2018-08-02 ENCOUNTER — Encounter: Payer: Self-pay | Admitting: *Deleted

## 2018-08-02 VITALS — BP 151/66 | HR 55 | Temp 97.9°F | Resp 20 | Ht 77.0 in | Wt 215.0 lb

## 2018-08-02 DIAGNOSIS — N185 Chronic kidney disease, stage 5: Secondary | ICD-10-CM

## 2018-08-02 DIAGNOSIS — I7025 Atherosclerosis of native arteries of other extremities with ulceration: Secondary | ICD-10-CM

## 2018-08-02 DIAGNOSIS — R0989 Other specified symptoms and signs involving the circulatory and respiratory systems: Secondary | ICD-10-CM | POA: Insufficient documentation

## 2018-08-02 NOTE — Progress Notes (Signed)
Patient name: Thomas Mcguire MRN: [id number] DOB: 21-May-1940 Sex: male  REASON FOR VISIT:   Follow-up after right femoropopliteal bypass and also follow-up of right AV fistula  HPI:   Thomas Mcguire is a pleasant 78 y.o. male who I performed a right femoral to below-knee popliteal artery bypass with a vein graft and amputation of the right great toe on 07/11/2018.  His only runoff is the peroneal artery.  Previously, on 05/23/2018 I had performed a right radiocephalic AV fistula.  With respect to his chronic kidney disease he is not on dialysis.  He denies any uremic symptoms.  With respect to his femoropopliteal bypass he is now approximately 3 weeks postop and his sutures are still in place.  He denies drainage fever or chills.  Current Outpatient Medications  Medication Sig Dispense Refill  . allopurinol (ZYLOPRIM) 300 MG tablet Take 300 mg by mouth daily.      Marland Kitchen amLODipine (NORVASC) 5 MG tablet Take 5 mg by mouth daily.     Marland Kitchen aspirin 81 MG tablet Take 81 mg by mouth daily.    . calcitRIOL (ROCALTROL) 0.25 MCG capsule Take 0.25 mcg by mouth daily.   5  . carvedilol (COREG) 25 MG tablet Take 12.5 mg by mouth 2 (two) times daily with a meal.     . COD LIVER OIL PO Take 1 capsule by mouth 2 (two) times daily.     . cyanocobalamin (,VITAMIN B-12,) 1000 MCG/ML injection Vitamin B12 injection: 1000 mcg (1 mL) by subcu injection once daily for 7 days; then once weekly for 4 weeks; then once monthly thereafter.  7 07/15/2010 (Patient taking differently: Inject 1,000 mcg into the muscle every 30 (thirty) days. ) 12 mL 1  . doxazosin (CARDURA) 2 MG tablet Take 2 mg by mouth every morning.     . ferrous sulfate 325 (65 FE) MG tablet Take 325 mg by mouth 2 (two) times daily with a meal.     . folic acid (FOLVITE) 1 MG tablet Take 1 tablet (1 mg total) by mouth daily. 90 tablet 0  . furosemide (LASIX) 40 MG tablet Take 40 mg by mouth 2 (two) times daily.     . insulin NPH (HUMULIN N,NOVOLIN N)  100 UNIT/ML injection Inject 50 Units into the skin 2 (two) times daily.     . insulin regular (HUMULIN R,NOVOLIN R) 100 units/mL injection Inject 20-35 Units into the skin 3 (three) times daily before meals. Sliding Scale    . isosorbide mononitrate (IMDUR) 30 MG 24 hr tablet Take 30 mg by mouth daily.  6  . potassium chloride (K-DUR) 10 MEQ tablet Take 10 mEq by mouth daily.  3  . pravastatin (PRAVACHOL) 40 MG tablet Take 40 mg by mouth at bedtime.     Marland Kitchen oxyCODONE (OXY IR/ROXICODONE) 5 MG immediate release tablet Take 1-2 tablets (5-10 mg total) by mouth every 4 (four) hours as needed for moderate pain. (Patient not taking: Reported on 08/02/2018) 30 tablet 0   No current facility-administered medications for this visit.     REVIEW OF SYSTEMS:  [X]  denotes positive finding, [ ]  denotes negative finding Vascular    Leg swelling    Cardiac    Chest pain or chest pressure:    Shortness of breath upon exertion:    Short of breath when lying flat:    Irregular heart rhythm:    Constitutional    Fever or chills:     PHYSICAL EXAM:  Vitals:   08/02/18 0927  BP: (!) 151/66  Pulse: (!) 55  Resp: 20  Temp: 97.9 F (36.6 C)  TempSrc: Temporal  SpO2: 100%  Weight: 215 lb (97.5 kg)  Height: 6\' 5"  (1.956 m)    GENERAL: The patient is a well-nourished male, in no acute distress. The vital signs are documented above. CARDIOVASCULAR: There is a regular rate and rhythm. PULMONARY: There is good air exchange bilaterally without wheezing or rales. VASCULAR: He has a reasonable thrill in his right radiocephalic fistula however the vein is small and not maturing adequately. His right foot is warm and well-perfused and the toe amp site appears to be healing adequately.  He has peroneal runoff only on the right.  DATA:   DUPLEX AV FISTULA: I have independently interpreted the duplex of his right radiocephalic AV fistula.  No specific problems are identified however the vein is still quite  small.  Diameters range from 0.34-0.46 cm.   MEDICAL ISSUES:   STATUS POST RIGHT FEMOROPOPLITEAL BYPASS: The patient is now 3 weeks status post right femoropopliteal bypass.  This was to the below-knee popliteal artery with vein.  He also had a right great toe amputation site.  This appears to be healing adequately.  He has peroneal runoff only.  We removed his staples in the office today.  I have scheduled a graft duplex and ABIs in 3 months and I will see him back at that time.  He knows to call sooner if he has problems.  Of note he is on aspirin and is on a statin.  STAGE IV CHRONIC KIDNEY DISEASE: His fistula does not appear to be maturing adequately..  For this reason I recommended that we proceed with a fistulogram.  This has been scheduled for 08/11/2018.  We will obviously limit contrast.  Deitra Mayo [facility] [phone]

## 2018-08-02 NOTE — H&P (View-Only) (Signed)
Patient name: Thomas Mcguire MRN: [id number] DOB: 1940/11/11 Sex: male  REASON FOR VISIT:   Follow-up after right femoropopliteal bypass and also follow-up of right AV fistula  HPI:   Thomas Mcguire is a pleasant 78 y.o. male who I performed a right femoral to below-knee popliteal artery bypass with a vein graft and amputation of the right great toe on 07/11/2018.  His only runoff is the peroneal artery.  Previously, on 05/23/2018 I had performed a right radiocephalic AV fistula.  With respect to his chronic kidney disease he is not on dialysis.  He denies any uremic symptoms.  With respect to his femoropopliteal bypass he is now approximately 3 weeks postop and his sutures are still in place.  He denies drainage fever or chills.  Current Outpatient Medications  Medication Sig Dispense Refill  . allopurinol (ZYLOPRIM) 300 MG tablet Take 300 mg by mouth daily.      Marland Kitchen amLODipine (NORVASC) 5 MG tablet Take 5 mg by mouth daily.     Marland Kitchen aspirin 81 MG tablet Take 81 mg by mouth daily.    . calcitRIOL (ROCALTROL) 0.25 MCG capsule Take 0.25 mcg by mouth daily.   5  . carvedilol (COREG) 25 MG tablet Take 12.5 mg by mouth 2 (two) times daily with a meal.     . COD LIVER OIL PO Take 1 capsule by mouth 2 (two) times daily.     . cyanocobalamin (,VITAMIN B-12,) 1000 MCG/ML injection Vitamin B12 injection: 1000 mcg (1 mL) by subcu injection once daily for 7 days; then once weekly for 4 weeks; then once monthly thereafter.  7 07/15/2010 (Patient taking differently: Inject 1,000 mcg into the muscle every 30 (thirty) days. ) 12 mL 1  . doxazosin (CARDURA) 2 MG tablet Take 2 mg by mouth every morning.     . ferrous sulfate 325 (65 FE) MG tablet Take 325 mg by mouth 2 (two) times daily with a meal.     . folic acid (FOLVITE) 1 MG tablet Take 1 tablet (1 mg total) by mouth daily. 90 tablet 0  . furosemide (LASIX) 40 MG tablet Take 40 mg by mouth 2 (two) times daily.     . insulin NPH (HUMULIN N,NOVOLIN N)  100 UNIT/ML injection Inject 50 Units into the skin 2 (two) times daily.     . insulin regular (HUMULIN R,NOVOLIN R) 100 units/mL injection Inject 20-35 Units into the skin 3 (three) times daily before meals. Sliding Scale    . isosorbide mononitrate (IMDUR) 30 MG 24 hr tablet Take 30 mg by mouth daily.  6  . potassium chloride (K-DUR) 10 MEQ tablet Take 10 mEq by mouth daily.  3  . pravastatin (PRAVACHOL) 40 MG tablet Take 40 mg by mouth at bedtime.     Marland Kitchen oxyCODONE (OXY IR/ROXICODONE) 5 MG immediate release tablet Take 1-2 tablets (5-10 mg total) by mouth every 4 (four) hours as needed for moderate pain. (Patient not taking: Reported on 08/02/2018) 30 tablet 0   No current facility-administered medications for this visit.     REVIEW OF SYSTEMS:  [X]  denotes positive finding, [ ]  denotes negative finding Vascular    Leg swelling    Cardiac    Chest pain or chest pressure:    Shortness of breath upon exertion:    Short of breath when lying flat:    Irregular heart rhythm:    Constitutional    Fever or chills:     PHYSICAL EXAM:  Vitals:   08/02/18 0927  BP: (!) 151/66  Pulse: (!) 55  Resp: 20  Temp: 97.9 F (36.6 C)  TempSrc: Temporal  SpO2: 100%  Weight: 215 lb (97.5 kg)  Height: 6\' 5"  (1.956 m)    GENERAL: The patient is a well-nourished male, in no acute distress. The vital signs are documented above. CARDIOVASCULAR: There is a regular rate and rhythm. PULMONARY: There is good air exchange bilaterally without wheezing or rales. VASCULAR: He has a reasonable thrill in his right radiocephalic fistula however the vein is small and not maturing adequately. His right foot is warm and well-perfused and the toe amp site appears to be healing adequately.  He has peroneal runoff only on the right.  DATA:   DUPLEX AV FISTULA: I have independently interpreted the duplex of his right radiocephalic AV fistula.  No specific problems are identified however the vein is still quite  small.  Diameters range from 0.34-0.46 cm.   MEDICAL ISSUES:   STATUS POST RIGHT FEMOROPOPLITEAL BYPASS: The patient is now 3 weeks status post right femoropopliteal bypass.  This was to the below-knee popliteal artery with vein.  He also had a right great toe amputation site.  This appears to be healing adequately.  He has peroneal runoff only.  We removed his staples in the office today.  I have scheduled a graft duplex and ABIs in 3 months and I will see him back at that time.  He knows to call sooner if he has problems.  Of note he is on aspirin and is on a statin.  STAGE IV CHRONIC KIDNEY DISEASE: His fistula does not appear to be maturing adequately..  For this reason I recommended that we proceed with a fistulogram.  This has been scheduled for 08/11/2018.  We will obviously limit contrast.  Deitra Mayo [facility] [phone]

## 2018-08-08 ENCOUNTER — Other Ambulatory Visit (HOSPITAL_COMMUNITY)
Admission: RE | Admit: 2018-08-08 | Discharge: 2018-08-08 | Disposition: A | Discharge status: Home / Self Care | Payer: Medicare Other | Source: Ambulatory Visit | Attending: Vascular Surgery | Admitting: Vascular Surgery

## 2018-08-08 DIAGNOSIS — Z20828 Contact with and (suspected) exposure to other viral communicable diseases: Secondary | ICD-10-CM | POA: Insufficient documentation

## 2018-08-08 LAB — SARS CORONAVIRUS 2 (TAT 6-24 HRS): SARS Coronavirus 2: NEGATIVE

## 2018-08-11 ENCOUNTER — Other Ambulatory Visit: Payer: Self-pay

## 2018-08-11 ENCOUNTER — Ambulatory Visit (HOSPITAL_COMMUNITY)
Admission: RE | Admit: 2018-08-11 | Discharge: 2018-08-11 | Disposition: A | Discharge status: Home / Self Care | Payer: Medicare Other | Attending: Vascular Surgery | Admitting: Vascular Surgery

## 2018-08-11 ENCOUNTER — Ambulatory Visit (HOSPITAL_COMMUNITY)
Admission: RE | Disposition: A | Discharge status: Home / Self Care | Payer: Self-pay | Source: Home / Self Care | Attending: Vascular Surgery

## 2018-08-11 DIAGNOSIS — Z7982 Long term (current) use of aspirin: Secondary | ICD-10-CM | POA: Diagnosis not present

## 2018-08-11 DIAGNOSIS — Z89411 Acquired absence of right great toe: Secondary | ICD-10-CM | POA: Insufficient documentation

## 2018-08-11 DIAGNOSIS — Z79899 Other long term (current) drug therapy: Secondary | ICD-10-CM | POA: Insufficient documentation

## 2018-08-11 DIAGNOSIS — N185 Chronic kidney disease, stage 5: Secondary | ICD-10-CM

## 2018-08-11 DIAGNOSIS — N184 Chronic kidney disease, stage 4 (severe): Secondary | ICD-10-CM | POA: Diagnosis present

## 2018-08-11 DIAGNOSIS — T82898A Other specified complication of vascular prosthetic devices, implants and grafts, initial encounter: Secondary | ICD-10-CM

## 2018-08-11 DIAGNOSIS — Z794 Long term (current) use of insulin: Secondary | ICD-10-CM | POA: Insufficient documentation

## 2018-08-11 HISTORY — PX: A/V FISTULAGRAM: CATH118298

## 2018-08-11 LAB — POCT I-STAT, CHEM 8
BUN: 53 mg/dL — ABNORMAL HIGH (ref 8–23)
Calcium, Ion: 1.19 mmol/L (ref 1.15–1.40)
Chloride: 106 mmol/L (ref 98–111)
Creatinine, Ser: 3.7 mg/dL — ABNORMAL HIGH (ref 0.61–1.24)
Glucose, Bld: 93 mg/dL (ref 70–99)
HCT: 34 % — ABNORMAL LOW (ref 39.0–52.0)
Hemoglobin: 11.6 g/dL — ABNORMAL LOW (ref 13.0–17.0)
Potassium: 3.6 mmol/L (ref 3.5–5.1)
Sodium: 142 mmol/L (ref 135–145)
TCO2: 25 mmol/L (ref 22–32)

## 2018-08-11 LAB — GLUCOSE, CAPILLARY
Glucose-Capillary: 91 mg/dL (ref 70–99)
Glucose-Capillary: 86 mg/dL (ref 70–99)

## 2018-08-11 SURGERY — A/V FISTULAGRAM
Anesthesia: LOCAL

## 2018-08-11 MED ORDER — SODIUM CHLORIDE 0.9% FLUSH: 3.0000 mL | INTRAVENOUS | Status: DC | PRN

## 2018-08-11 MED ORDER — SODIUM CHLORIDE 0.9 % IV SOLN: 250.0000 mL | INTRAVENOUS | Status: DC | PRN

## 2018-08-11 MED ORDER — LIDOCAINE HCL (PF) 1 % IJ SOLN
INTRAMUSCULAR | Status: DC | PRN
  Administered 2018-08-11: 2 mL via INTRADERMAL

## 2018-08-11 MED ORDER — HEPARIN (PORCINE) IN NACL 1000-0.9 UT/500ML-% IV SOLN
INTRAVENOUS | Status: AC
  Filled 2018-08-11: qty 500

## 2018-08-11 MED ORDER — IODIXANOL 320 MG/ML IV SOLN
INTRAVENOUS | Status: DC | PRN
  Administered 2018-08-11: 10 mL via INTRAVENOUS

## 2018-08-11 MED ORDER — LIDOCAINE HCL (PF) 1 % IJ SOLN
INTRAMUSCULAR | Status: AC
  Filled 2018-08-11: qty 30

## 2018-08-11 MED ORDER — SODIUM CHLORIDE 0.9% FLUSH: 3.0000 mL | Freq: Two times a day (BID) | INTRAVENOUS | Status: DC

## 2018-08-11 SURGICAL SUPPLY — 9 items
BAG SNAP BAND KOVER 36X36 (MISCELLANEOUS) ×2 IMPLANT
COVER DOME SNAP 22 D (MISCELLANEOUS) ×2 IMPLANT
KIT MICROPUNCTURE NIT STIFF (SHEATH) ×2 IMPLANT
PROTECTION STATION PRESSURIZED (MISCELLANEOUS) ×2
SHEATH PROBE COVER 6X72 (BAG) ×4 IMPLANT
STATION PROTECTION PRESSURIZED (MISCELLANEOUS) ×1 IMPLANT
STOPCOCK MORSE 400PSI 3WAY (MISCELLANEOUS) ×2 IMPLANT
TRAY PV CATH (CUSTOM PROCEDURE TRAY) ×2 IMPLANT
TUBING CIL FLEX 10 FLL-RA (TUBING) ×2 IMPLANT

## 2018-08-11 NOTE — Op Note (Signed)
   PATIENT: Thomas Mcguire      MRN: [id number] DOB: 1940-05-29    DATE OF PROCEDURE: 08/11/2018  INDICATIONS:    [name] is a 78 y.o. male who had a right radiocephalic fistula created on 05/23/2018.  He is not yet on dialysis.  On his most recent duplex the vein was small with no specific problems identified.  He presents for a fistulogram.  PROCEDURE:    1.  Ultrasound-guided access to the right radiocephalic fistula 2.  Fistulogram right radiocephalic fistula  SURGEON: Judeth Cornfield. Scot Dock, MD, FACS  ANESTHESIA: Local  EBL: Minimal  TECHNIQUE: The patient was brought to the peripheral vascular lab.  The right arm was prepped and draped in usual sterile fashion.  Under ultrasound guidance, after the skin was anesthetized, I cannulated the proximal fistula with a micropuncture needle and a micropuncture sheath was introduced over the wire.  By ultrasound the proximal fistula was patent.  A real-time image was sent to the server.  2 injections were made through the micropuncture sheath with half-strength contrast.  A total of 10 cc of contrast were used as the patient is not yet on dialysis.  No specific problems were identified with the fistula.  The cephalic vein empties into the basilic system at the antecubital level.  There is no areas of stenosis identified.  FINDINGS:   Patent right radiocephalic fistula which empties into the basilic system.  No specific problems identified.  CLINICAL NOTE: I will obtain a follow-up duplex scan in 6 weeks.  If the fistula fails to mature the only real option would be to convert this to a basilic vein transposition.  Deitra Mayo, MD, FACS [facility]  DATE OF DICTATION:   08/11/2018

## 2018-08-11 NOTE — Discharge Instructions (Signed)
AV Fistula Placement, Care After  This sheet gives you information about how to care for yourself after your procedure. Your health care provider may also give you more specific instructions. If you have problems or questions, contact your health care provider.  What can I expect after the procedure?  After the procedure, it is common to:  · Feel sore.  · Feel a vibration (thrill) over the fistula.  Follow these instructions at home:  Incision care         · Follow instructions from your health care provider about how to take care of your incision. Make sure you:  ? Wash your hands with soap and water before and after you change your bandage (dressing). If soap and water are not available, use hand sanitizer.  ? Change your dressing as told by your health care provider.  ? Leave stitches (sutures), skin glue, or adhesive strips in place. These skin closures may need to stay in place for 2 weeks or longer. If adhesive strip edges start to loosen and curl up, you may trim the loose edges. Do not remove adhesive strips completely unless your health care provider tells you to do that.  Fistula care  · Check your fistula site every day to make sure the thrill feels the same.  · Check your fistula site every day for signs of infection. Check for:  ? Redness, swelling, or pain.  ? Fluid or blood.  ? Warmth.  ? Pus or a bad smell.  · Raise (elevate) the affected area above the level of your heart while you are sitting or lying down.  · Do not lift anything that is heavier than 10 lb (4.5 kg), or the limit that you are told, until your health care provider says that it is safe.  · Do not lie down on your fistula arm.  · Do not let anyone draw blood or take a blood pressure reading on your fistula arm. This is important.  · Do not wear tight jewelry or clothing over your fistula arm.  Bathing  · Do not take baths, swim, or use a hot tub until your health care provider approves. Ask your health care provider if you may take  showers. You may only be allowed to take sponge baths.  · Keep the area around your incision clean and dry.  Medicines  · Take over-the-counter and prescription medicines only as told by your health care provider.  · Ask your health care provider if any medicine prescribed to you can cause constipation. You may need to take steps to prevent or treat constipation, such as:  ? Drink enough fluid to keep your urine pale yellow.  ? Take over-the-counter or prescription medicines.  ? Eat foods that are high in fiber, such as beans, whole grains, and fresh fruits and vegetables.  ? Limit foods that are high in fat and processed sugars, such as fried or sweet foods.  General instructions  · Rest at home for a day or two.  · Return to your normal activities as told by your health care provider. Ask your health care provider what activities are safe for you.  · Keep all follow-up visits as told by your health care provider. This is important.  Contact a health care provider if:  · You have redness, swelling, or pain around your fistula site.  · Your fistula site feels warm to the touch.  · You have pus or a bad smell coming from your   pain.  Have trouble breathing. Summary  Follow instructions from your health care provider about how to take care of your incision.  Do not let anyone draw blood or take a blood pressure reading on your fistula arm. This is important.  Return to your normal activities as told by your health care provider. Ask your health care provider what activities are safe for you.  Contact a health care provider if you have a change in the thrill or have any signs of infection at your fistula site.  Keep all follow-up visits as told by your health care provider. This is  important. This information is not intended to replace advice given to you by your health care provider. Make sure you discuss any questions you have with your health care provider. Document Released: 12/28/2004 Document Revised: 07/04/2017 Document Reviewed: 07/04/2017 Elsevier Patient Education  2020 [employer].

## 2018-08-11 NOTE — Progress Notes (Signed)
Pt discharge instruction given per MD order.  Pt able to verbalize instruction.

## 2018-08-11 NOTE — Interval H&P Note (Signed)
History and Physical Interval Note:  08/11/2018 8:27 AM  Thomas Mcguire  has presented today for surgery, with the diagnosis of poor flow in fistula.  The various methods of treatment have been discussed with the patient and family. After consideration of risks, benefits and other options for treatment, the patient has consented to  Procedure(s): A/V FISTULAGRAM - Right Arm (N/A) as a surgical intervention.  The patient's history has been reviewed, patient examined, no change in status, stable for surgery.  I have reviewed the patient's chart and labs.  Questions were answered to the patient's satisfaction.     Deitra Mayo

## 2018-08-14 ENCOUNTER — Encounter (HOSPITAL_COMMUNITY): Payer: Self-pay | Admitting: Vascular Surgery

## 2018-08-14 DIAGNOSIS — S98119A Complete traumatic amputation of unspecified great toe, initial encounter: Secondary | ICD-10-CM | POA: Insufficient documentation

## 2018-08-14 MED FILL — Heparin Sod (Porcine)-NaCl IV Soln 1000 Unit/500ML-0.9%: INTRAVENOUS | Qty: 500 | Status: AC

## 2018-09-01 ENCOUNTER — Other Ambulatory Visit: Payer: Self-pay | Admitting: Oncology

## 2018-09-01 DIAGNOSIS — E538 Deficiency of other specified B group vitamins: Secondary | ICD-10-CM

## 2018-09-01 DIAGNOSIS — D649 Anemia, unspecified: Secondary | ICD-10-CM

## 2018-09-01 DIAGNOSIS — C9110 Chronic lymphocytic leukemia of B-cell type not having achieved remission: Secondary | ICD-10-CM

## 2018-09-15 ENCOUNTER — Other Ambulatory Visit: Payer: Self-pay

## 2018-09-15 DIAGNOSIS — I7025 Atherosclerosis of native arteries of other extremities with ulceration: Secondary | ICD-10-CM

## 2018-09-15 DIAGNOSIS — N185 Chronic kidney disease, stage 5: Secondary | ICD-10-CM

## 2018-09-20 ENCOUNTER — Emergency Department (HOSPITAL_COMMUNITY)
Admission: EM | Admit: 2018-09-20 | Discharge: 2018-09-20 | Disposition: A | Discharge status: Home / Self Care | Payer: Medicare Other | Attending: Emergency Medicine | Admitting: Emergency Medicine

## 2018-09-20 ENCOUNTER — Encounter: Payer: Medicare Other | Admitting: Family

## 2018-09-20 ENCOUNTER — Other Ambulatory Visit: Payer: Self-pay

## 2018-09-20 ENCOUNTER — Encounter (HOSPITAL_COMMUNITY): Payer: Medicare Other

## 2018-09-20 ENCOUNTER — Telehealth: Payer: Self-pay | Admitting: Oncology

## 2018-09-20 ENCOUNTER — Emergency Department (HOSPITAL_COMMUNITY): Payer: Medicare Other

## 2018-09-20 ENCOUNTER — Encounter (HOSPITAL_COMMUNITY): Payer: Self-pay

## 2018-09-20 DIAGNOSIS — Z7982 Long term (current) use of aspirin: Secondary | ICD-10-CM | POA: Insufficient documentation

## 2018-09-20 DIAGNOSIS — J810 Acute pulmonary edema: Secondary | ICD-10-CM

## 2018-09-20 DIAGNOSIS — Z79899 Other long term (current) drug therapy: Secondary | ICD-10-CM | POA: Diagnosis not present

## 2018-09-20 DIAGNOSIS — Z794 Long term (current) use of insulin: Secondary | ICD-10-CM | POA: Diagnosis not present

## 2018-09-20 DIAGNOSIS — Z87891 Personal history of nicotine dependence: Secondary | ICD-10-CM | POA: Diagnosis not present

## 2018-09-20 DIAGNOSIS — E119 Type 2 diabetes mellitus without complications: Secondary | ICD-10-CM | POA: Insufficient documentation

## 2018-09-20 DIAGNOSIS — Z20828 Contact with and (suspected) exposure to other viral communicable diseases: Secondary | ICD-10-CM | POA: Diagnosis not present

## 2018-09-20 DIAGNOSIS — I10 Essential (primary) hypertension: Secondary | ICD-10-CM | POA: Diagnosis not present

## 2018-09-20 DIAGNOSIS — N289 Disorder of kidney and ureter, unspecified: Secondary | ICD-10-CM | POA: Insufficient documentation

## 2018-09-20 DIAGNOSIS — R0602 Shortness of breath: Secondary | ICD-10-CM | POA: Diagnosis present

## 2018-09-20 LAB — CBC WITH DIFFERENTIAL/PLATELET
Abs Immature Granulocytes: 0.03 10*3/uL (ref 0.00–0.07)
Basophils Absolute: 0 10*3/uL (ref 0.0–0.1)
Basophils Relative: 0 %
Eosinophils Absolute: 0.2 10*3/uL (ref 0.0–0.5)
Eosinophils Relative: 1 %
HCT: 32.9 % — ABNORMAL LOW (ref 39.0–52.0)
Hemoglobin: 10.2 g/dL — ABNORMAL LOW (ref 13.0–17.0)
Immature Granulocytes: 0 %
Lymphocytes Relative: 59 %
Lymphs Abs: 11 10*3/uL — ABNORMAL HIGH (ref 0.7–4.0)
MCH: 27.9 pg (ref 26.0–34.0)
MCHC: 31 g/dL (ref 30.0–36.0)
MCV: 89.9 fL (ref 80.0–100.0)
Monocytes Absolute: 2.5 10*3/uL — ABNORMAL HIGH (ref 0.1–1.0)
Monocytes Relative: 14 %
Neutro Abs: 4.8 10*3/uL (ref 1.7–7.7)
Neutrophils Relative %: 26 %
Platelets: 154 10*3/uL (ref 150–400)
RBC: 3.66 MIL/uL — ABNORMAL LOW (ref 4.22–5.81)
RDW: 17.1 % — ABNORMAL HIGH (ref 11.5–15.5)
WBC: 18.6 10*3/uL — ABNORMAL HIGH (ref 4.0–10.5)
nRBC: 0 % (ref 0.0–0.2)

## 2018-09-20 LAB — BASIC METABOLIC PANEL
Anion gap: 8 (ref 5–15)
BUN: 77 mg/dL — ABNORMAL HIGH (ref 8–23)
CO2: 21 mmol/L — ABNORMAL LOW (ref 22–32)
Calcium: 9 mg/dL (ref 8.9–10.3)
Chloride: 109 mmol/L (ref 98–111)
Creatinine, Ser: 4.88 mg/dL — ABNORMAL HIGH (ref 0.61–1.24)
GFR calc Af Amer: 12 mL/min — ABNORMAL LOW (ref >60)
GFR calc non Af Amer: 11 mL/min — ABNORMAL LOW (ref >60)
Glucose, Bld: 97 mg/dL (ref 70–99)
Potassium: 4 mmol/L (ref 3.5–5.1)
Sodium: 138 mmol/L (ref 135–145)

## 2018-09-20 LAB — SARS CORONAVIRUS 2 BY RT PCR (HOSPITAL ORDER, PERFORMED IN ~~LOC~~ HOSPITAL LAB): SARS Coronavirus 2: NEGATIVE

## 2018-09-20 LAB — BRAIN NATRIURETIC PEPTIDE: B Natriuretic Peptide: 928.6 pg/mL — ABNORMAL HIGH (ref 0.0–100.0)

## 2018-09-20 MED ORDER — FUROSEMIDE 80 MG PO TABS: 80.0000 mg | ORAL_TABLET | Freq: Two times a day (BID) | ORAL | 0 refills | Status: DC

## 2018-09-20 MED ORDER — FUROSEMIDE 10 MG/ML IJ SOLN
80.0000 mg | Freq: Once | INTRAMUSCULAR | Status: AC
  Administered 2018-09-20: 12:00:00 80 mg via INTRAVENOUS
  Filled 2018-09-20: qty 8

## 2018-09-20 NOTE — Discharge Instructions (Addendum)
It appears that you have a mild case of pulmonary edema, which in your case has been aggravated by your renal insufficiency.  We are increasing her Lasix to 80 mg, twice a day to improve your urinary output.  This should tend to improve your shortness of breath when you are lying down, over the next few days.  Make sure that you continue to watch her salt intake, and follow-up with your primary care doctor for checkup in a week or 2.  Return here, if needed, for problems.

## 2018-09-20 NOTE — ED Provider Notes (Signed)
[facility] DEPT Provider Note   CSN: [id number] Arrival date & time: 09/20/18  9562     History   Chief Complaint Chief Complaint  Patient presents with  . Shortness of Breath  . Leg Swelling    HPI Thomas Mcguire is a 78 y.o. male.     HPI   He presents for evaluation of shortness of breath.  He has chronic renal disease, and is preparing for potential dialysis having recently been given a right upper arm fistula.  Fistulogram performed, 08/11/2018, at that time fistula was not yet mature.  He states he has had shortness of breath primarily when lying down, for 2 weeks.  He also complains of leg swelling, left greater than right.  He denies chest pain, fever, cough, sneezing, nausea, vomiting, weakness or dizziness.  There are no other known modifying factors.  Past Medical History:  Diagnosis Date  . Anemia    low iron  . Arthritis   . Chronic renal insufficiency    CHECKED Q3MOS PER PT. Stage 4 as of 05/22/2018 per pt.  . Diabetes mellitus    type 2  . Gout    Has not had recently- 08/24/11  . Hyperlipidemia   . Hypertension   . 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 PAD (peripheral artery disease) ([place])   . Wears dentures     Patient Active Problem List   Diagnosis Date Noted  . PAD (peripheral artery disease) ([place]) 07/11/2018  . B12 deficiency 11/30/2017  . Folic acid deficiency 13/08/6576  . 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 A/V FISTULAGRAM N/A 08/11/2018   Procedure: A/V FISTULAGRAM - Right Arm;  Surgeon: Angelia Mould, MD;  Location: [facility];  Service: Cardiovascular;  Laterality: N/A;  . ABDOMINAL AORTOGRAM W/LOWER EXTREMITY Right 07/03/2018   Procedure: ABDOMINAL AORTOGRAM W/LOWER EXTREMITY;  Surgeon: Waynetta Sandy, MD;  Location: [facility];  Service: Cardiovascular;  Laterality:  Right;  . AMPUTATION  08/27/2011   Procedure: AMPUTATION DIGIT;  Surgeon: Newt Minion, MD;  Location: [facility];  Service: Orthopedics;  Laterality: Left;  Left Foot 3rd toe Amputation MTP joint  . AMPUTATION Right 07/11/2018   Procedure: AMPUTATION RIGHT GREAT TOE;  Surgeon: Angelia Mould, MD;  Location: [facility];  Service: Vascular;  Laterality: Right;  . amputation great toe  2009   left  . AV FISTULA PLACEMENT Right 05/23/2018   Procedure: RADIAL- CEPHALIC ARTERIOVENOUS (AV) FISTULA CREATION RIGHT ARM AND LIGATION OF COMPETING BRANCH.;  Surgeon: Angelia Mould, MD;  Location: [facility];  Service: Vascular;  Laterality: Right;  . COLONOSCOPY    . cyst elbow  1999   right  . FEMORAL-POPLITEAL BYPASS GRAFT Right 07/11/2018   Procedure: BYPASS GRAFT FEMORAL-POPLITEAL ARTERY;  Surgeon: Angelia Mould, MD;  Location: [facility];  Service: Vascular;  Laterality: Right;  . FOOT SURGERY     both feet for something that was torn  . TOE AMPUTATION  2010   left 2nd toe  . UPPER GI ENDOSCOPY          Home Medications    Prior to Admission medications   Medication Sig Start Date End Date Taking? Authorizing Provider  allopurinol (ZYLOPRIM) 300 MG tablet Take 300 mg by mouth daily.     Yes [provider]  amLODipine (NORVASC) 5 MG tablet Take 5  mg by mouth daily.    Yes [provider]  aspirin 81 MG tablet Take 81 mg by mouth daily.   Yes [provider]  calcitRIOL (ROCALTROL) 0.25 MCG capsule Take 0.25 mcg by mouth daily.  10/27/17  Yes [provider]  carvedilol (COREG) 25 MG tablet Take 12.5 mg by mouth 2 (two) times daily with a meal.    Yes [provider]  COD LIVER OIL PO Take 1 capsule by mouth 2 (two) times daily.    Yes [provider]  cyanocobalamin (,VITAMIN B-12,) 1000 MCG/ML injection Vitamin B12 injection: 1000 mcg (1 mL) by subcu injection once daily for 7 days; then once weekly for 4 weeks; then once monthly  thereafter.  7 07/15/2010 11/15/17  Yes Henreitta Leber, MD  doxazosin (CARDURA) 2 MG tablet Take 2 mg by mouth every morning.    Yes [provider]  ferrous sulfate 325 (65 FE) MG tablet Take 325 mg by mouth 2 (two) times daily with a meal.    Yes [provider]  folic acid (FOLVITE) 1 MG tablet TAKE 1 TABLET BY MOUTH EVERY DAY Patient taking differently: Take 1 mg by mouth daily.  09/01/18  Yes Ladell Pier, MD  insulin NPH (HUMULIN N,NOVOLIN N) 100 UNIT/ML injection Inject 50 Units into the skin 2 (two) times daily.    Yes [provider]  insulin regular (HUMULIN R,NOVOLIN R) 100 units/mL injection Inject 20-25 Units into the skin 3 (three) times daily before meals. Sliding Scale   Yes [provider]  isosorbide mononitrate (IMDUR) 30 MG 24 hr tablet Take 30 mg by mouth daily. 11/07/17  Yes [provider]  potassium chloride (K-DUR) 10 MEQ tablet Take 10 mEq by mouth daily. 11/02/17  Yes [provider]  pravastatin (PRAVACHOL) 40 MG tablet Take 40 mg by mouth at bedtime.    Yes [provider]  furosemide (LASIX) 80 MG tablet Take 1 tablet (80 mg total) by mouth 2 (two) times daily. 09/20/18   Daleen Bo, MD  oxyCODONE (OXY IR/ROXICODONE) 5 MG immediate release tablet Take 1-2 tablets (5-10 mg total) by mouth every 4 (four) hours as needed for moderate pain. Patient not taking: Reported on 08/02/2018 07/14/18   Ulyses Amor, PA-C    Family History Family History  Problem Relation Age of Onset  . Colon cancer Neg Hx     Social History Social History   Tobacco Use  . Smoking status: Former Smoker    Types: Cigarettes    Quit date: 03/11/1996    Years since quitting: 22.5  . Smokeless tobacco: Never Used  Substance Use Topics  . Alcohol use: No  . Drug use: No     Allergies   Patient has no known allergies.   Review of Systems Review of Systems  All other systems reviewed and are negative.    Physical  Exam Updated Vital Signs BP (!) 156/62 (BP Location: Left Arm)   Pulse 69   Temp 98.1 F (36.7 C) (Oral)   Resp 18   Ht 6\' 5"  (1.956 m)   Wt 97.5 kg   SpO2 97%   BMI 25.49 kg/m   Physical Exam Vitals signs and nursing note reviewed.  Constitutional:      Thomas: He is not in acute distress.    Appearance: He is well-developed. He is not ill-appearing, toxic-appearing or diaphoretic.  HENT:     Head: Normocephalic and atraumatic.  Right Ear: External ear normal.     Left Ear: External ear normal.  Eyes:     Conjunctiva/sclera: Conjunctivae normal.     Pupils: Pupils are equal, round, and reactive to light.  Neck:     Musculoskeletal: Normal range of motion and neck supple.     Trachea: Phonation normal.  Cardiovascular:     Rate and Rhythm: Normal rate and regular rhythm.     Heart sounds: Normal heart sounds.  Pulmonary:     Effort: Pulmonary effort is normal.     Breath sounds: Examination of the right-middle field reveals rales. Examination of the left-middle field reveals rales. Examination of the right-lower field reveals rales. Examination of the left-lower field reveals rales. Decreased breath sounds and rales present. No wheezing or rhonchi.  Chest:     Chest wall: No mass, deformity, tenderness or crepitus.  Abdominal:     Palpations: Abdomen is soft.     Tenderness: There is no abdominal tenderness.  Musculoskeletal: Normal range of motion.     Right lower leg: He exhibits no tenderness. Edema (3+) present.     Left lower leg: He exhibits no tenderness. Edema (4+) present.  Skin:    Thomas: Skin is warm and dry.     Coloration: Skin is not cyanotic or pale.  Neurological:     Mental Status: He is alert and oriented to person, place, and time.     Cranial Nerves: No cranial nerve deficit.     Sensory: No sensory deficit.     Motor: No weakness or abnormal muscle tone.     Coordination: Coordination normal.  Psychiatric:        Mood and Affect: Mood  normal.        Behavior: Behavior normal.        Thought Content: Thought content normal.        Judgment: Judgment normal.      ED Treatments / Results  Labs (all labs ordered are listed, but only abnormal results are displayed) Labs Reviewed  BRAIN NATRIURETIC PEPTIDE - Abnormal; Notable for the following components:      Result Value   B Natriuretic Peptide 928.6 (*)    All other components within normal limits  BASIC METABOLIC PANEL - Abnormal; Notable for the following components:   CO2 21 (*)    BUN 77 (*)    Creatinine, Ser 4.88 (*)    GFR calc non Af Amer 11 (*)    GFR calc Af Amer 12 (*)    All other components within normal limits  CBC WITH DIFFERENTIAL/PLATELET - Abnormal; Notable for the following components:   WBC 18.6 (*)    RBC 3.66 (*)    Hemoglobin 10.2 (*)    HCT 32.9 (*)    RDW 17.1 (*)    Lymphs Abs 11.0 (*)    Monocytes Absolute 2.5 (*)    All other components within normal limits  SARS CORONAVIRUS 2 (HOSPITAL ORDER, [facility])    EKG None  Radiology Dg Chest 2 View  Result Date: 09/20/2018 CLINICAL DATA:  Shortness of breath and bilateral lower extremity swelling worsening over the past 2 weeks. EXAM: CHEST - 2 VIEW COMPARISON:  Chest x-ray 08/24/2011 FINDINGS: The heart is upper limits of normal in size. There is mild tortuosity and calcification of the thoracic aorta. The pulmonary hila appear grossly normal. Peribronchial thickening, increased interstitial markings and Kerley B lines consistent with interstitial pulmonary edema. There are  associated small bilateral pleural effusions and bibasilar atelectasis. No definite infiltrates and no worrisome pulmonary lesions. The bony thorax is intact. IMPRESSION: Interstitial pulmonary edema with small bilateral pleural effusions and bibasilar atelectasis. Electronically Signed   By: Marijo Sanes M.D.   On: 09/20/2018 10:28    Procedures Procedures (including critical care  time)  Medications Ordered in ED Medications  furosemide (LASIX) injection 80 mg (80 mg Intravenous Given 09/20/18 1133)     Initial Impression / Assessment and Plan / ED Course  I have reviewed the triage vital signs and the nursing notes.  Pertinent labs & imaging results that were available during my care of the patient were reviewed by me and considered in my medical decision making (see chart for details).  Clinical Course as of Sep 20 1451  Wed Sep 20, 2018  1133 Normal except white count high, hemoglobin low  CBC with Differential(!) [EW]  1133 Elevated  Brain natriuretic peptide(!) [EW]  1133 Normal except BUN high, creatinine high, GFR low  Basic metabolic panel(!) [EW]  0347 Pulmonary edema with effusions, no focal infiltrates.  Images reviewed by me  DG Chest 2 View [EW]  1445 Normal  SARS Coronavirus 2 [facility] order, Performed in [facility] hospital lab) Nasopharyngeal Nasopharyngeal Swab [EW]    Clinical Course User Index [EW] Daleen Bo, MD        Patient Vitals for the past 24 hrs:  BP Temp Temp src Pulse Resp SpO2 Height Weight  09/20/18 1436 (!) 156/62 - - 69 18 97 % - -  09/20/18 1300 (!) 145/65 - - (!) 56 15 95 % - -  09/20/18 1246 (!) 152/63 - - 60 18 96 % - -  09/20/18 1230 (!) 152/63 - - 75 18 95 % - -  09/20/18 1200 (!) 151/75 - - 63 (!) 25 94 % - -  09/20/18 1130 (!) 151/60 - - 67 17 94 % - -  09/20/18 1100 (!) 149/62 - - 68 16 93 % - -  09/20/18 1030 (!) 151/63 - - 64 13 97 % - -  09/20/18 1010 - - - - - - 6\' 5"  (1.956 m) 97.5 kg  09/20/18 1007 (!) 156/57 98.1 F (36.7 C) Oral 73 16 99 % - -    2:46 PM Reevaluation with update and discussion. After initial assessment and treatment, an updated evaluation reveals patient is comfortable he has urinated about 500 cc of clear yellow urine.  He feels like he is comfortable enough to go home.  Findings discussed and questions answered. Daleen Bo   Medical Decision Making: Shortness of  breath with pulmonary edema and effusions, and elevated BNP.  Creatinine marginally higher than baseline, with chronic renal insufficiency.  He is on relatively low-dose Lasix, currently.  Oxygenation initially 99% on room air, without tachypnea.  Suspect volume overload, requiring higher dose of Lasix.    CRITICAL CARE-no Performed by: Daleen Bo  Nursing Notes Reviewed/ Care Coordinated Applicable Imaging Reviewed Interpretation of Laboratory Data incorporated into ED treatment  The patient appears reasonably screened and/or stabilized for discharge and I doubt any other medical condition or other [facility] requiring further screening, evaluation, or treatment in the ED at this time prior to discharge.  Plan: Home Medications-increase Lasix to 80 mg twice a day.; Home Treatments-sleep sitting up if needed, and watch salt intake.; return here if the recommended treatment, does not improve the symptoms; Recommended follow up-PCP follow-up 1 to 2 weeks.   Final Clinical  Impressions(s) / ED Diagnoses   Final diagnoses:  Acute pulmonary edema ([place])  Renal insufficiency    ED Discharge Orders         Ordered    furosemide (LASIX) 80 MG tablet  2 times daily     09/20/18 1452           Daleen Bo, MD 09/20/18 1454

## 2018-09-20 NOTE — Telephone Encounter (Signed)
Returned patient's phone call regarding rescheduling an appointment, left a voicemail. 

## 2018-09-20 NOTE — ED Triage Notes (Signed)
Pt c/o SOB and BLE swelling x 2 weeks.  Denies pain.  Pt reports previously having fluid around lungs and feels the same.    Pt had fistula placed around 2 months ago.

## 2018-09-20 NOTE — ED Notes (Signed)
Patient transported to X-ray 

## 2018-10-05 ENCOUNTER — Other Ambulatory Visit: Payer: Self-pay

## 2018-10-05 DIAGNOSIS — N185 Chronic kidney disease, stage 5: Secondary | ICD-10-CM

## 2018-10-06 ENCOUNTER — Other Ambulatory Visit: Payer: Self-pay

## 2018-10-06 ENCOUNTER — Ambulatory Visit (INDEPENDENT_AMBULATORY_CARE_PROVIDER_SITE_OTHER): Payer: Medicare Other | Admitting: Physician Assistant

## 2018-10-06 ENCOUNTER — Ambulatory Visit (HOSPITAL_COMMUNITY)
Admission: RE | Admit: 2018-10-06 | Discharge: 2018-10-06 | Disposition: A | Discharge status: Home / Self Care | Payer: Medicare Other | Source: Ambulatory Visit | Attending: Family | Admitting: Family

## 2018-10-06 DIAGNOSIS — N185 Chronic kidney disease, stage 5: Secondary | ICD-10-CM

## 2018-10-06 DIAGNOSIS — N189 Chronic kidney disease, unspecified: Secondary | ICD-10-CM | POA: Insufficient documentation

## 2018-10-06 NOTE — Progress Notes (Signed)
Established Dialysis Access   History of Present Illness   Thomas Mcguire is a 78 y.o. (1940/01/19) male who presents for re-evaluation for permanent access.  He had a right radiocephalic fistula created by Dr. Scot Dock in May of this year.  This fistula was slow to mature so he underwent fistulogram without intervention 08/04/2018.  Plan at that time was to convert to basilic vein transposition and upper arm if duplex in 6 weeks did not show any further maturation.  Patient states he is still not yet on hemodialysis and is still seeing his nephrologist about every 2 months.  Patient states they have not indicated to him that his kidney function is worsening.  It is also worth noting that patient is status post right femoral to below the knee popliteal bypass with vein and great toe amputation by Dr. Scot Dock on 07/11/2018.  He has since healed toe amputation and bypass incisions.  He also states his leg feels much better with ambulation.  The patient's PMH, PSH, SH, and FamHx were reviewed and are unchanged from prior visit.  Current Outpatient Medications  Medication Sig Dispense Refill  . allopurinol (ZYLOPRIM) 100 MG tablet TAKE 1 TABLET BY MOUTH EVERY MONDAY/WEDNESDAY/FRIDAY    . amLODipine (NORVASC) 5 MG tablet Take 5 mg by mouth daily.     Marland Kitchen aspirin 81 MG tablet Take 81 mg by mouth daily.    . calcitRIOL (ROCALTROL) 0.25 MCG capsule Take 0.25 mcg by mouth daily.   5  . carvedilol (COREG) 25 MG tablet Take 12.5 mg by mouth 2 (two) times daily with a meal.     . COD LIVER OIL PO Take 1 capsule by mouth 2 (two) times daily.     . cyanocobalamin (,VITAMIN B-12,) 1000 MCG/ML injection Vitamin B12 injection: 1000 mcg (1 mL) by subcu injection once daily for 7 days; then once weekly for 4 weeks; then once monthly thereafter.  7 07/15/2010 12 mL 1  . doxazosin (CARDURA) 2 MG tablet Take 2 mg by mouth every morning.     . ferrous sulfate 325 (65 FE) MG tablet Take 325 mg by mouth 2 (two) times  daily with a meal.     . folic acid (FOLVITE) 1 MG tablet TAKE 1 TABLET BY MOUTH EVERY DAY (Patient taking differently: Take 1 mg by mouth daily. ) 90 tablet 1  . furosemide (LASIX) 80 MG tablet Take 1 tablet (80 mg total) by mouth 2 (two) times daily. 60 tablet 0  . insulin NPH (HUMULIN N,NOVOLIN N) 100 UNIT/ML injection Inject 50 Units into the skin 2 (two) times daily.     . insulin regular (HUMULIN R,NOVOLIN R) 100 units/mL injection Inject 20-25 Units into the skin 3 (three) times daily before meals. Sliding Scale    . isosorbide mononitrate (IMDUR) 30 MG 24 hr tablet Take 30 mg by mouth daily.  6  . oxyCODONE (OXY IR/ROXICODONE) 5 MG immediate release tablet Take 1-2 tablets (5-10 mg total) by mouth every 4 (four) hours as needed for moderate pain. 30 tablet 0  . potassium chloride (K-DUR) 10 MEQ tablet Take 10 mEq by mouth daily.  3  . pravastatin (PRAVACHOL) 40 MG tablet Take 40 mg by mouth at bedtime.     . metolazone (ZAROXOLYN) 5 MG tablet TAKE 1 (ONE) TABLET MONDAY AND FRIDAY    . sodium bicarbonate 650 MG tablet Take 650 mg by mouth 2 (two) times daily.     No current facility-administered medications for  this visit.     On ROS today: 10 system ROS is negative unless otherwise noted in HPI   Physical Examination   Vitals:   10/06/18 0947  BP: (!) 131/57  Pulse: 66  Resp: 20  Temp: 97.9 F (36.6 C)  SpO2: 98%  Weight: 217 lb (98.4 kg)  Height: 6\' 5"  (1.956 m)   Body mass index is 25.73 kg/m.  General Alert, O x 3, WD, NAD  Pulmonary Sym exp, good B air movt  Cardiac RRR, Nl S1, S2  Vascular Vessel Right Left  Radial Palpable Palpable  Brachial Palpable Palpable  Ulnar Not palpable Not palpable    Musculo- skeletal M/S 5/5 throughout  , Palpable thrill right radiocephalic fistula especially near anastomosis; more difficult to feel thrill and fistula itself as he moved closer to the elbow; grip strength intact right hand    Neurologic A&O; CN grossly intact      Non-invasive Vascular Imaging   right Arm Access Duplex  (10/06/18):   Unchanged from July study demonstrating about 3-1/2 to 4 mm diameter; inflow radial artery of small caliber    Medical Decision Making   Thomas Mcguire is a 78 y.o. male who presents with chronic kidney disease stage V not yet requiring hemodialysis.    Based on fistula duplex, diameter of fistula is essentially unchanged from duplex performed in July  On physical exam there appears to only be a small area near the anastomosis that can be reliably used for hemodialysis  Offered patient conversion to basilic vein transposition in upper arm however patient would like to wait a little bit longer to see if fistula will mature  I discussed with the patient that in the event he will require initiation of hemodialysis, we would likely have to place a temporary dialysis catheter.  Furthermore if he decides to consent to basilic vein fistula we will need nearly 3 more months until patient potentially has a functioning AV fistula ready for use during hemodialysis  Patient already has a ABI follow-up with Dr. Scot Dock in about 1 month.  I will add a fistula duplex to the visit.   Dagoberto Ligas PA-C [facility] Office: [phone]  Clinic MD: Dr. Donnetta Hutching

## 2018-11-06 ENCOUNTER — Other Ambulatory Visit: Payer: Self-pay

## 2018-11-06 DIAGNOSIS — N185 Chronic kidney disease, stage 5: Secondary | ICD-10-CM

## 2018-11-08 ENCOUNTER — Encounter: Payer: Self-pay | Admitting: Vascular Surgery

## 2018-11-08 ENCOUNTER — Ambulatory Visit (HOSPITAL_COMMUNITY)
Admission: RE | Admit: 2018-11-08 | Discharge: 2018-11-08 | Disposition: A | Discharge status: Home / Self Care | Payer: Medicare Other | Source: Ambulatory Visit | Attending: Vascular Surgery | Admitting: Vascular Surgery

## 2018-11-08 ENCOUNTER — Ambulatory Visit (INDEPENDENT_AMBULATORY_CARE_PROVIDER_SITE_OTHER)
Admission: RE | Admit: 2018-11-08 | Discharge: 2018-11-08 | Disposition: A | Discharge status: Home / Self Care | Payer: Medicare Other | Source: Ambulatory Visit | Attending: Vascular Surgery | Admitting: Vascular Surgery

## 2018-11-08 ENCOUNTER — Other Ambulatory Visit: Payer: Self-pay

## 2018-11-08 ENCOUNTER — Ambulatory Visit (INDEPENDENT_AMBULATORY_CARE_PROVIDER_SITE_OTHER): Payer: Medicare Other | Admitting: Vascular Surgery

## 2018-11-08 VITALS — BP 149/56 | HR 55 | Temp 97.9°F | Resp 20 | Ht 77.0 in | Wt 194.0 lb

## 2018-11-08 DIAGNOSIS — N185 Chronic kidney disease, stage 5: Secondary | ICD-10-CM

## 2018-11-08 DIAGNOSIS — I7025 Atherosclerosis of native arteries of other extremities with ulceration: Secondary | ICD-10-CM

## 2018-11-08 DIAGNOSIS — Z48812 Encounter for surgical aftercare following surgery on the circulatory system: Secondary | ICD-10-CM | POA: Diagnosis not present

## 2018-11-08 NOTE — Progress Notes (Signed)
Patient name: Thomas Mcguire MRN: [id number] DOB: February 19, 1940 Sex: male  REASON FOR VISIT:   Follow-up of peripheral vascular disease.  HPI:   Thomas Mcguire is a pleasant 78 y.o. male who underwent a right femoral to below-knee popliteal artery bypass with a vein graft in June of this year.  He also had a right great toe amputation and amputation of the first second and third toes on the left foot.  Since I saw him last he has no specific complaints.  He denies claudication although I think his activity is fairly limited.  He denies any rest pain or nonhealing ulcers.  He has a right radiocephalic fistula.  He is not on dialysis.  He denies any recent uremic symptoms.  Specifically, he denies nausea, vomiting, fatigue, anorexia, or palpitations.  Past Medical History:  Diagnosis Date  . Anemia    low iron  . Arthritis   . Chronic renal insufficiency    CHECKED Q3MOS PER PT. Stage 4 as of 05/22/2018 per pt.  . Diabetes mellitus    type 2  . Gout    Has not had recently- 08/24/11  . Hyperlipidemia   . Hypertension   . 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 PAD (peripheral artery disease) ([place])   . Wears dentures     Family History  Problem Relation Age of Onset  . Colon cancer Neg Hx     SOCIAL HISTORY: Social History   Tobacco Use  . Smoking status: Former Smoker    Types: Cigarettes    Quit date: 03/11/1996    Years since quitting: 22.6  . Smokeless tobacco: Never Used  Substance Use Topics  . Alcohol use: No    No Known Allergies  Current Outpatient Medications  Medication Sig Dispense Refill  . allopurinol (ZYLOPRIM) 100 MG tablet TAKE 1 TABLET BY MOUTH EVERY MONDAY/WEDNESDAY/FRIDAY    . amLODipine (NORVASC) 5 MG tablet Take 5 mg by mouth daily.     Marland Kitchen aspirin 81 MG tablet Take 81 mg by mouth daily.    . calcitRIOL (ROCALTROL) 0.25 MCG capsule Take 0.25 mcg by mouth daily.   5  . carvedilol (COREG) 25 MG tablet Take 12.5 mg by mouth  2 (two) times daily with a meal.     . COD LIVER OIL PO Take 1 capsule by mouth 2 (two) times daily.     . cyanocobalamin (,VITAMIN B-12,) 1000 MCG/ML injection Vitamin B12 injection: 1000 mcg (1 mL) by subcu injection once daily for 7 days; then once weekly for 4 weeks; then once monthly thereafter.  7 07/15/2010 12 mL 1  . doxazosin (CARDURA) 2 MG tablet Take 2 mg by mouth every morning.     . ferrous sulfate 325 (65 FE) MG tablet Take 325 mg by mouth 2 (two) times daily with a meal.     . folic acid (FOLVITE) 1 MG tablet TAKE 1 TABLET BY MOUTH EVERY DAY (Patient taking differently: Take 1 mg by mouth daily. ) 90 tablet 1  . furosemide (LASIX) 80 MG tablet Take 1 tablet (80 mg total) by mouth 2 (two) times daily. 60 tablet 0  . insulin NPH (HUMULIN N,NOVOLIN N) 100 UNIT/ML injection Inject 50 Units into the skin 2 (two) times daily.     . insulin regular (HUMULIN R,NOVOLIN R) 100 units/mL injection Inject 20-25 Units into the skin 3 (three) times daily before meals. Sliding Scale    . isosorbide mononitrate (IMDUR)  30 MG 24 hr tablet Take 30 mg by mouth daily.  6  . metolazone (ZAROXOLYN) 5 MG tablet TAKE 1 (ONE) TABLET MONDAY AND FRIDAY    . oxyCODONE (OXY IR/ROXICODONE) 5 MG immediate release tablet Take 1-2 tablets (5-10 mg total) by mouth every 4 (four) hours as needed for moderate pain. 30 tablet 0  . potassium chloride (K-DUR) 10 MEQ tablet Take 10 mEq by mouth daily.  3  . pravastatin (PRAVACHOL) 40 MG tablet Take 40 mg by mouth at bedtime.     . sodium bicarbonate 650 MG tablet Take 650 mg by mouth 2 (two) times daily.     No current facility-administered medications for this visit.     REVIEW OF SYSTEMS:  [X]  denotes positive finding, [ ]  denotes negative finding Cardiac  Comments:  Chest pain or chest pressure:    Shortness of breath upon exertion:    Short of breath when lying flat:    Irregular heart rhythm:        Vascular    Pain in calf, thigh, or hip brought on by  ambulation:    Pain in feet at night that wakes you up from your sleep:     Blood clot in your veins:    Leg swelling:         Pulmonary    Oxygen at home:    Productive cough:     Wheezing:         Neurologic    Sudden weakness in arms or legs:     Sudden numbness in arms or legs:     Sudden onset of difficulty speaking or slurred speech:    Temporary loss of vision in one eye:     Problems with dizziness:         Gastrointestinal    Blood in stool:     Vomited blood:         Genitourinary    Burning when urinating:     Blood in urine:        Psychiatric    Major depression:         Hematologic    Bleeding problems:    Problems with blood clotting too easily:        Skin    Rashes or ulcers:        Constitutional    Fever or chills:     PHYSICAL EXAM:   Vitals:   11/08/18 0940  BP: (!) 149/56  Pulse: (!) 55  Resp: 20  Temp: 97.9 F (36.6 C)  SpO2: 100%  Weight: 194 lb (88 kg)  Height: 6\' 5"  (1.956 m)    GENERAL: The patient is a well-nourished male, in no acute distress. The vital signs are documented above. CARDIAC: There is a regular rate and rhythm.  VASCULAR: I do not detect carotid bruits. He has palpable femoral pulses. On the right side where he has a bypass he has a palpable popliteal pulse.  I cannot palpate a popliteal pulse on the left.  I cannot palpate pedal pulses.  He has no significant lower extremity swelling. PULMONARY: There is good air exchange bilaterally without wheezing or rales. ABDOMEN: Soft and non-tender with normal pitched bowel sounds.  MUSCULOSKELETAL: His right great toe amputation site and left first second and third toe amputations sites of all completely healed. NEUROLOGIC: No focal weakness or paresthesias are detected. SKIN: There are no ulcers or rashes noted. PSYCHIATRIC: The patient has a normal affect.  DATA:  GRAFT DUPLEX: I have independently interpreted his graft duplex today.  He has biphasic flow  proximal to the graft of the proximal anastomosis.  There is monophasic flow distal to that.  There are elevated velocities in the distal graft suggesting a greater than 50% stenosis.  ARTERIAL DOPPLER STUDY: I have independently interpreted his arterial Doppler study today.  On the right side, which is the side with the bypass, there is a monophasic dorsalis pedis and posterior tibial signal.  ABIs cannot be obtained as the arteries are calcified.  On the left side there is a monophasic dorsalis pedis and posterior tibial signal.  ABIs are not reliable as the arteries are calcified.  DIALYSIS ACCESS DUPLEX: I minimal interpreted his right upper extremity duplex to evaluate his radiocephalic fistula.  This shows low flow volumes throughout.  The diameters of the vein ranged from 0.33-0.44 cm.  MEDICAL ISSUES:   PERIPHERAL VASCULAR DISEASE: Patient's bypass graft is patent.  He has a right femoral below-knee pop bypass with vein.  He has some elevated velocities in the distal graft that we will need to keep an eye on.  I have ordered a follow-up duplex scan in 6 months and I will see him back at that time.  If the stenosis progresses we will need to consider arteriography.  However he has stage 5 chronic kidney disease.  STAGE V CHRONIC KIDNEY DISEASE: His right radiocephalic fistula is not adequate in size.  However is not yet on dialysis.  I am reluctant to give him contrast.  However if he is nearing dialysis I think we would need to proceed with a fistulogram to see if there is anything we can do with a right radiocephalic fistula to help it continue to mature.  Deitra Mayo [facility] [phone]

## 2018-11-13 ENCOUNTER — Telehealth: Payer: Self-pay | Admitting: Oncology

## 2018-11-13 NOTE — Telephone Encounter (Signed)
Returned patient's phone call regarding rescheduling 12/11 appointment, per patient's request appointment has moved to 12/10.

## 2018-12-13 ENCOUNTER — Ambulatory Visit (INDEPENDENT_AMBULATORY_CARE_PROVIDER_SITE_OTHER): Payer: Medicare Other | Admitting: Vascular Surgery

## 2018-12-13 ENCOUNTER — Encounter: Payer: Self-pay | Admitting: Vascular Surgery

## 2018-12-13 ENCOUNTER — Other Ambulatory Visit: Payer: Self-pay

## 2018-12-13 VITALS — BP 150/64 | HR 55 | Temp 97.2°F | Resp 20 | Ht 77.0 in | Wt 212.6 lb

## 2018-12-13 DIAGNOSIS — N184 Chronic kidney disease, stage 4 (severe): Secondary | ICD-10-CM

## 2018-12-13 NOTE — Progress Notes (Signed)
Patient name: Thomas Mcguire MRN: [id number] DOB: 1940/05/20 Sex: male  REASON FOR VISIT:   Poorly maturing right radiocephalic fistula  HPI:   Thomas Mcguire is a pleasant 78 y.o. male who is not yet on dialysis.  He had a right radiocephalic fistula placed in May of this year.  This has been slow to mature.  On 08/11/2018 he had a fistulogram which showed no significant problems with the fistula.  There were no areas of stenosis and no significant competing branches.  The forearm cephalic vein empties into the basilic system.  My feeling was that this did not continue to enlarge we could consider a right basilic vein transposition.  Of note in the interim on 07/11/2018 he underwent a right femoral to below-knee pop bypass with a vein graft to the right great toe amputation.  He is not yet on dialysis.  He denies any recent uremic symptoms.  Past Medical History:  Diagnosis Date  . Anemia    low iron  . Arthritis   . Chronic renal insufficiency    CHECKED Q3MOS PER PT. Stage 4 as of 05/22/2018 per pt.  . Diabetes mellitus    type 2  . Gout    Has not had recently- 08/24/11  . Hyperlipidemia   . Hypertension   . 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 PAD (peripheral artery disease) ([place])   . Wears dentures     Family History  Problem Relation Age of Onset  . Colon cancer Neg Hx     SOCIAL HISTORY: Social History   Tobacco Use  . Smoking status: Former Smoker    Types: Cigarettes    Quit date: 03/11/1996    Years since quitting: 22.7  . Smokeless tobacco: Never Used  Substance Use Topics  . Alcohol use: No    No Known Allergies  Current Outpatient Medications  Medication Sig Dispense Refill  . allopurinol (ZYLOPRIM) 100 MG tablet TAKE 1 TABLET BY MOUTH EVERY MONDAY/WEDNESDAY/FRIDAY    . amLODipine (NORVASC) 5 MG tablet Take 5 mg by mouth daily.     Marland Kitchen aspirin 81 MG tablet Take 81 mg by mouth daily.    . calcitRIOL (ROCALTROL) 0.25 MCG  capsule Take 0.25 mcg by mouth daily.   5  . calcium acetate (PHOSLO) 667 MG capsule Take 667 mg by mouth 3 (three) times daily.    . carvedilol (COREG) 25 MG tablet Take 12.5 mg by mouth 2 (two) times daily with a meal.     . COD LIVER OIL PO Take 1 capsule by mouth 2 (two) times daily.     . cyanocobalamin (,VITAMIN B-12,) 1000 MCG/ML injection Vitamin B12 injection: 1000 mcg (1 mL) by subcu injection once daily for 7 days; then once weekly for 4 weeks; then once monthly thereafter.  7 07/15/2010 12 mL 1  . doxazosin (CARDURA) 2 MG tablet Take 2 mg by mouth every morning.     . ferrous sulfate 325 (65 FE) MG tablet Take 325 mg by mouth 2 (two) times daily with a meal.     . folic acid (FOLVITE) 1 MG tablet TAKE 1 TABLET BY MOUTH EVERY DAY (Patient taking differently: Take 1 mg by mouth daily. ) 90 tablet 1  . furosemide (LASIX) 80 MG tablet Take 1 tablet (80 mg total) by mouth 2 (two) times daily. 60 tablet 0  . insulin NPH (HUMULIN N,NOVOLIN N) 100 UNIT/ML injection Inject 50 Units into the  skin 2 (two) times daily.     . insulin regular (HUMULIN R,NOVOLIN R) 100 units/mL injection Inject 20-25 Units into the skin 3 (three) times daily before meals. Sliding Scale    . isosorbide mononitrate (IMDUR) 30 MG 24 hr tablet Take 30 mg by mouth daily.  6  . metolazone (ZAROXOLYN) 5 MG tablet TAKE 1 (ONE) TABLET MONDAY AND FRIDAY    . oxyCODONE (OXY IR/ROXICODONE) 5 MG immediate release tablet Take 1-2 tablets (5-10 mg total) by mouth every 4 (four) hours as needed for moderate pain. 30 tablet 0  . potassium chloride (K-DUR) 10 MEQ tablet Take 10 mEq by mouth daily.  3  . pravastatin (PRAVACHOL) 40 MG tablet Take 40 mg by mouth at bedtime.     . sodium bicarbonate 650 MG tablet Take 650 mg by mouth 2 (two) times daily.     No current facility-administered medications for this visit.     REVIEW OF SYSTEMS:  [X]  denotes positive finding, [ ]  denotes negative finding Cardiac  Comments:  Chest pain or  chest pressure:    Shortness of breath upon exertion:    Short of breath when lying flat:    Irregular heart rhythm:        Vascular    Pain in calf, thigh, or hip brought on by ambulation:    Pain in feet at night that wakes you up from your sleep:     Blood clot in your veins:    Leg swelling:         Pulmonary    Oxygen at home:    Productive cough:     Wheezing:         Neurologic    Sudden weakness in arms or legs:     Sudden numbness in arms or legs:     Sudden onset of difficulty speaking or slurred speech:    Temporary loss of vision in one eye:     Problems with dizziness:         Gastrointestinal    Blood in stool:     Vomited blood:         Genitourinary    Burning when urinating:     Blood in urine:        Psychiatric    Major depression:         Hematologic    Bleeding problems:    Problems with blood clotting too easily:        Skin    Rashes or ulcers:        Constitutional    Fever or chills:     PHYSICAL EXAM:   Vitals:   12/13/18 1448  BP: (!) 150/64  Pulse: (!) 55  Resp: 20  Temp: (!) 97.2 F (36.2 C)  SpO2: 100%  Weight: 212 lb 9.6 oz (96.4 kg)  Height: 6\' 5"  (1.956 m)    GENERAL: The patient is a well-nourished male, in no acute distress. The vital signs are documented above. CARDIAC: There is a regular rate and rhythm.  VASCULAR: His fistula has a palpable thrill although somewhat weak. PULMONARY: There is good air exchange bilaterally without wheezing or rales. ABDOMEN: Soft and non-tender with normal pitched bowel sounds.  MUSCULOSKELETAL: There are no major deformities or cyanosis. NEUROLOGIC: No focal weakness or paresthesias are detected. SKIN: There are no ulcers or rashes noted. PSYCHIATRIC: The patient has a normal affect.  DATA:    No new data  MEDICAL ISSUES:   STAGE  IV CHRONIC KIDNEY DISEASE: The patient's right radiocephalic fistula does not appear to be maturing adequately.  I have suggested that we proceed  with placement of a basilic vein transposition on the right.  This is a more involved procedure and that it involves removal of the entire upper arm basilic vein and retunneling.  I reviewed all his previous studies and do not see any way to assist in maturation of his forearm fistula.  He would like to think about this more before scheduling surgery.  If he is agreeable to proceed I think we will proceed with a right basilic vein transposition likely in 1 stage.  I explained that if he does not feel comfortable proceeding and certainly if he required dialysis he could have a dialysis catheter placed in the basilic vein transposition done at that time.  He will let us know if he decides to proceed with surgery.  Deitra Mayo [facility] [phone]

## 2018-12-15 DIAGNOSIS — I509 Heart failure, unspecified: Secondary | ICD-10-CM | POA: Insufficient documentation

## 2018-12-20 ENCOUNTER — Telehealth: Payer: Self-pay | Admitting: Oncology

## 2018-12-20 NOTE — Telephone Encounter (Signed)
Called patient per providers request, spoke with patient's wife and she will notify him regarding 12/10 appointments. Phone visit.

## 2018-12-21 ENCOUNTER — Inpatient Hospital Stay: Payer: Medicare Other | Admitting: Oncology

## 2018-12-21 ENCOUNTER — Other Ambulatory Visit: Payer: Self-pay

## 2018-12-21 ENCOUNTER — Inpatient Hospital Stay: Payer: Medicare Other | Attending: Oncology

## 2018-12-21 DIAGNOSIS — C9110 Chronic lymphocytic leukemia of B-cell type not having achieved remission: Secondary | ICD-10-CM

## 2018-12-21 DIAGNOSIS — Z23 Encounter for immunization: Secondary | ICD-10-CM | POA: Diagnosis not present

## 2018-12-21 LAB — CBC WITH DIFFERENTIAL (CANCER CENTER ONLY)
Abs Immature Granulocytes: 0.04 10*3/uL (ref 0.00–0.07)
Basophils Absolute: 0.1 10*3/uL (ref 0.0–0.1)
Basophils Relative: 0 %
Eosinophils Absolute: 0.2 10*3/uL (ref 0.0–0.5)
Eosinophils Relative: 1 %
HCT: 31.9 % — ABNORMAL LOW (ref 39.0–52.0)
Hemoglobin: 10.2 g/dL — ABNORMAL LOW (ref 13.0–17.0)
Immature Granulocytes: 0 %
Lymphocytes Relative: 53 %
Lymphs Abs: 10.9 10*3/uL — ABNORMAL HIGH (ref 0.7–4.0)
MCH: 26.7 pg (ref 26.0–34.0)
MCHC: 32 g/dL (ref 30.0–36.0)
MCV: 83.5 fL (ref 80.0–100.0)
Monocytes Absolute: 4 10*3/uL — ABNORMAL HIGH (ref 0.1–1.0)
Monocytes Relative: 19 %
Neutro Abs: 5.6 10*3/uL (ref 1.7–7.7)
Neutrophils Relative %: 27 %
Platelet Count: 103 10*3/uL — ABNORMAL LOW (ref 150–400)
RBC: 3.82 MIL/uL — ABNORMAL LOW (ref 4.22–5.81)
RDW: 15.7 % — ABNORMAL HIGH (ref 11.5–15.5)
WBC Count: 20.8 10*3/uL — ABNORMAL HIGH (ref 4.0–10.5)
nRBC: 0 % (ref 0.0–0.2)

## 2018-12-21 MED ORDER — PNEUMOCOCCAL 13-VAL CONJ VACC IM SUSP: 0.5000 mL | Freq: Once | INTRAMUSCULAR | Status: DC

## 2018-12-21 MED ORDER — CYANOCOBALAMIN 1000 MCG/ML IJ SOLN: INTRAMUSCULAR | 1 refills | Status: DC

## 2018-12-21 NOTE — Progress Notes (Signed)
  [place] OFFICE VISIT PROGRESS NOTE  I connected with Thomas Mcguire on 12/21/18 at 12:30 AM EST by telephone and verified that I am speaking with the correct person using two identifiers.   I discussed the limitations, risks, security and privacy concerns of performing an evaluation and management service by telemedicine and the availability of in-person appointments. I also discussed with the patient that there may be a patient responsible charge related to this service. The patient expressed understanding and agreed to proceed.  Patient's location: Home Provider's location: Office    Diagnosis: CLL  INTERVAL HISTORY:   Thomas Mcguire is seen today for a telehealth visit secondary to the Covid pandemic.  He reports feeling well.  No fever, night sweats, anorexia, or enlarging lymph nodes.  He underwent a right femoral to below-knee popliteal artery bypass and amputation of the right great toe on 07/11/2018.   He reports feeling better after his diuretic regimen was adjusted by nephrology.  Objective:    Lab Results:  Lab Results  Component Value Date   WBC 20.8 (H) 12/21/2018   HGB 10.2 (L) 12/21/2018   HCT 31.9 (L) 12/21/2018   MCV 83.5 12/21/2018   PLT 103 (L) 12/21/2018   NEUTROABS 5.6 12/21/2018    Medications: I have reviewed the patient's current medications.  Assessment/Plan: 1.Chronic lymphocytic leukemia 2.Anemia 3.Chronic renal failure 4.Vitamin O97, folic acid? Deficiency 5.Diabetes 6.Hypertension 7.  Thrombocytopenia  Disposition: Thomas Mcguire has chronic lymphocytic leukemia.  He appears asymptomatic from the CLL.  He has mild anemia and thrombocytopenia.  The anemia is likely in part secondary to CLL and chronic renal failure.  He will call for symptoms of anemia.  There is no indication for treating the CLL at present.  Thomas Mcguire will return for an office and lab visit in 4 months.   He will receive a 13 Valent pneumococcal vaccine when he returns in 4 months.  He was diagnosed with vitamin B12 deficiency last year.  He continues monthly vitamin B12 injection therapy.  We discussed switching to oral vitamin B12.  He prefers continuing the monthly injection.   I discussed the assessment and treatment plan with the patient. The patient was provided an opportunity to ask questions and all were answered. The patient agreed with the plan and demonstrated an understanding of the instructions.   The patient was advised to call back or seek an in-person evaluation if the symptoms worsen or if the condition fails to improve as anticipated.  I provided 20 minutes of telephone, chart review, and documentation time during this encounter, and > 50% was spent counseling as documented under my assessment & plan.  Betsy Coder ANP/GNP-BC   12/21/2018 12:36 PM

## 2018-12-21 NOTE — Addendum Note (Signed)
Addended by: Tania Ade on: 12/21/2018 01:57 PM   Modules accepted: Orders

## 2018-12-22 ENCOUNTER — Other Ambulatory Visit: Payer: Medicare Other

## 2018-12-22 ENCOUNTER — Ambulatory Visit: Payer: Medicare Other | Admitting: Oncology

## 2018-12-22 ENCOUNTER — Telehealth: Payer: Self-pay | Admitting: Oncology

## 2018-12-22 NOTE — Telephone Encounter (Signed)
Scheduled per los. Called and left msg. Mailed printout  °

## 2019-01-17 ENCOUNTER — Other Ambulatory Visit: Payer: Self-pay

## 2019-01-17 DIAGNOSIS — Z20822 Contact with and (suspected) exposure to covid-19: Secondary | ICD-10-CM

## 2019-01-18 LAB — NOVEL CORONAVIRUS, NAA: SARS-CoV-2, NAA: NOT DETECTED

## 2019-02-20 ENCOUNTER — Other Ambulatory Visit: Payer: Self-pay | Admitting: Oncology

## 2019-02-20 DIAGNOSIS — E538 Deficiency of other specified B group vitamins: Secondary | ICD-10-CM

## 2019-02-20 DIAGNOSIS — D649 Anemia, unspecified: Secondary | ICD-10-CM

## 2019-02-20 DIAGNOSIS — C9110 Chronic lymphocytic leukemia of B-cell type not having achieved remission: Secondary | ICD-10-CM

## 2019-04-23 ENCOUNTER — Inpatient Hospital Stay: Payer: Medicare Other

## 2019-04-23 ENCOUNTER — Encounter: Payer: Self-pay | Admitting: Nurse Practitioner

## 2019-04-23 ENCOUNTER — Other Ambulatory Visit: Payer: Self-pay

## 2019-04-23 ENCOUNTER — Inpatient Hospital Stay: Payer: Medicare Other | Attending: Nurse Practitioner | Admitting: Nurse Practitioner

## 2019-04-23 VITALS — BP 159/55 | HR 54 | Temp 99.1°F | Resp 18 | Ht 77.0 in | Wt 217.7 lb

## 2019-04-23 DIAGNOSIS — Z23 Encounter for immunization: Secondary | ICD-10-CM | POA: Diagnosis not present

## 2019-04-23 DIAGNOSIS — E538 Deficiency of other specified B group vitamins: Secondary | ICD-10-CM | POA: Diagnosis not present

## 2019-04-23 DIAGNOSIS — E1122 Type 2 diabetes mellitus with diabetic chronic kidney disease: Secondary | ICD-10-CM | POA: Diagnosis not present

## 2019-04-23 DIAGNOSIS — C9110 Chronic lymphocytic leukemia of B-cell type not having achieved remission: Secondary | ICD-10-CM | POA: Diagnosis present

## 2019-04-23 DIAGNOSIS — D649 Anemia, unspecified: Secondary | ICD-10-CM | POA: Diagnosis not present

## 2019-04-23 DIAGNOSIS — D696 Thrombocytopenia, unspecified: Secondary | ICD-10-CM | POA: Insufficient documentation

## 2019-04-23 DIAGNOSIS — N189 Chronic kidney disease, unspecified: Secondary | ICD-10-CM | POA: Insufficient documentation

## 2019-04-23 DIAGNOSIS — I129 Hypertensive chronic kidney disease with stage 1 through stage 4 chronic kidney disease, or unspecified chronic kidney disease: Secondary | ICD-10-CM | POA: Insufficient documentation

## 2019-04-23 LAB — CBC WITH DIFFERENTIAL (CANCER CENTER ONLY)
Abs Immature Granulocytes: 0.05 10*3/uL (ref 0.00–0.07)
Basophils Absolute: 0.1 10*3/uL (ref 0.0–0.1)
Basophils Relative: 0 %
Eosinophils Absolute: 0.2 10*3/uL (ref 0.0–0.5)
Eosinophils Relative: 1 %
HCT: 33.4 % — ABNORMAL LOW (ref 39.0–52.0)
Hemoglobin: 10.6 g/dL — ABNORMAL LOW (ref 13.0–17.0)
Immature Granulocytes: 0 %
Lymphocytes Relative: 70 %
Lymphs Abs: 15.4 10*3/uL — ABNORMAL HIGH (ref 0.7–4.0)
MCH: 27.4 pg (ref 26.0–34.0)
MCHC: 31.7 g/dL (ref 30.0–36.0)
MCV: 86.3 fL (ref 80.0–100.0)
Monocytes Absolute: 1.9 10*3/uL — ABNORMAL HIGH (ref 0.1–1.0)
Monocytes Relative: 9 %
Neutro Abs: 4.4 10*3/uL (ref 1.7–7.7)
Neutrophils Relative %: 20 %
Platelet Count: 120 10*3/uL — ABNORMAL LOW (ref 150–400)
RBC: 3.87 MIL/uL — ABNORMAL LOW (ref 4.22–5.81)
RDW: 15.2 % (ref 11.5–15.5)
WBC Count: 21.9 10*3/uL — ABNORMAL HIGH (ref 4.0–10.5)
nRBC: 0 % (ref 0.0–0.2)

## 2019-04-23 MED ORDER — PNEUMOCOCCAL 13-VAL CONJ VACC IM SUSP
0.5000 mL | Freq: Once | INTRAMUSCULAR | Status: AC
  Administered 2019-04-23: 0.5 mL via INTRAMUSCULAR
  Filled 2019-04-23: qty 0.5

## 2019-04-23 NOTE — Patient Instructions (Signed)
Pneumococcal Conjugate Vaccine suspension for injection What is this medicine? PNEUMOCOCCAL VACCINE (NEU mo KOK al vak SEEN) is a vaccine used to prevent pneumococcus bacterial infections. These bacteria can cause serious infections like pneumonia, meningitis, and blood infections. This vaccine will lower your chance of getting pneumonia. If you do get pneumonia, it can make your symptoms milder and your illness shorter. This vaccine will not treat an infection and will not cause infection. This vaccine is recommended for infants and young children, adults with certain medical conditions, and adults 65 years or older. This medicine may be used for other purposes; ask your health care provider or pharmacist if you have questions. COMMON BRAND NAME(S): Prevnar, Prevnar 13 What should I tell my health care provider before I take this medicine? They need to know if you have any of these conditions:  bleeding problems  fever  immune system problems  an unusual or allergic reaction to pneumococcal vaccine, diphtheria toxoid, other vaccines, latex, other medicines, foods, dyes, or preservatives  pregnant or trying to get pregnant  breast-feeding How should I use this medicine? This vaccine is for injection into a muscle. It is given by a health care professional. A copy of Vaccine Information Statements will be given before each vaccination. Read this sheet carefully each time. The sheet may change frequently. Talk to your pediatrician regarding the use of this medicine in children. While this drug may be prescribed for children as young as 6 weeks old for selected conditions, precautions do apply. Overdosage: If you think you have taken too much of this medicine contact a poison control center or emergency room at once. NOTE: This medicine is only for you. Do not share this medicine with others. What if I miss a dose? It is important not to miss your dose. Call your doctor or health care  professional if you are unable to keep an appointment. What may interact with this medicine?  medicines for cancer chemotherapy  medicines that suppress your immune function  steroid medicines like prednisone or cortisone This list may not describe all possible interactions. Give your health care provider a list of all the medicines, herbs, non-prescription drugs, or dietary supplements you use. Also tell them if you smoke, drink alcohol, or use illegal drugs. Some items may interact with your medicine. What should I watch for while using this medicine? Mild fever and pain should go away in 3 days or less. Report any unusual symptoms to your doctor or health care professional. What side effects may I notice from receiving this medicine? Side effects that you should report to your doctor or health care professional as soon as possible:  allergic reactions like skin rash, itching or hives, swelling of the face, lips, or tongue  breathing problems  confused  fast or irregular heartbeat  fever over 102 degrees F  seizures  unusual bleeding or bruising  unusual muscle weakness Side effects that usually do not require medical attention (report to your doctor or health care professional if they continue or are bothersome):  aches and pains  diarrhea  fever of 102 degrees F or less  headache  irritable  loss of appetite  pain, tender at site where injected  trouble sleeping This list may not describe all possible side effects. Call your doctor for medical advice about side effects. You may report side effects to FDA at 1-800-FDA-1088. Where should I keep my medicine? This does not apply. This vaccine is given in a clinic, pharmacy, doctor's office,   or other health care setting and will not be stored at home. NOTE: This sheet is a summary. It may not cover all possible information. If you have questions about this medicine, talk to your doctor, pharmacist, or health care  provider.  2020 Elsevier/Gold Standard (2013-10-04 10:27:27)  

## 2019-04-23 NOTE — Progress Notes (Signed)
  [place] OFFICE PROGRESS NOTE   Diagnosis: CLL  INTERVAL HISTORY:   Thomas Mcguire returns as scheduled.  Overall feels well.  No interim illnesses or infections.  No fevers or sweats.  He has good appetite.  No weight loss.  He has not noticed any enlarged lymph nodes.  Objective:  Vital signs in last 24 hours:  Blood pressure (!) 159/55, pulse (!) 54, temperature 99.1 F (37.3 C), temperature source Temporal, resp. rate 18, height 6\' 5"  (1.956 m), weight 217 lb 11.2 oz (98.7 kg), SpO2 100 %.    HEENT: No thrush or ulcers. Lymphatics: Tiny right and left posterior cervical lymph nodes, tiny bilateral low neck/scalene lymph nodes; approximate 1 cm left axillary lymph node. Resp: Lungs clear bilaterally. Cardio: Regular rate and rhythm. GI: Abdomen soft and nontender.  No hepatosplenomegaly. Vascular: No leg edema.  Lab Results:  Lab Results  Component Value Date   WBC 21.9 (H) 04/23/2019   HGB 10.6 (L) 04/23/2019   HCT 33.4 (L) 04/23/2019   MCV 86.3 04/23/2019   PLT 120 (L) 04/23/2019   NEUTROABS 4.4 04/23/2019    Imaging:  No results found.  Medications: I have reviewed the patient's current medications.  Assessment/Plan: 1.Chronic lymphocytic leukemia 2.Anemia 3.Chronic renal failure 4.Vitamin O87, folic acid? Deficiency 5.Diabetes 6.Hypertension 7.  Thrombocytopenia  Disposition: Thomas Mcguire has CLL.  He appears asymptomatic.  He remains stable from a hematologic standpoint.  There is no indication for treatment.  Plan to continue to follow with observation.  He understands to seek evaluation for symptoms of an infection. He understands the importance of remaining up-to-date on vaccines.  He will receive the 13 Valent pneumococcal vaccine today.  He has completed both COVID-19 vaccine injections.    He will return for a CBC and follow-up visit in 4 months.  Blayze Haen ANP/GNP-BC   04/23/2019  3:23 PM

## 2019-04-24 ENCOUNTER — Telehealth: Payer: Self-pay | Admitting: Oncology

## 2019-04-24 NOTE — Telephone Encounter (Signed)
Scheduled per los. Called and left msg. Mailed printout  °

## 2019-06-06 ENCOUNTER — Other Ambulatory Visit: Payer: Self-pay | Admitting: Oncology

## 2019-06-06 DIAGNOSIS — C9110 Chronic lymphocytic leukemia of B-cell type not having achieved remission: Secondary | ICD-10-CM

## 2019-06-06 DIAGNOSIS — E538 Deficiency of other specified B group vitamins: Secondary | ICD-10-CM

## 2019-06-06 DIAGNOSIS — D649 Anemia, unspecified: Secondary | ICD-10-CM

## 2019-06-30 ENCOUNTER — Other Ambulatory Visit: Payer: Self-pay | Admitting: Oncology

## 2019-06-30 DIAGNOSIS — E538 Deficiency of other specified B group vitamins: Secondary | ICD-10-CM

## 2019-06-30 DIAGNOSIS — D649 Anemia, unspecified: Secondary | ICD-10-CM

## 2019-06-30 DIAGNOSIS — C9110 Chronic lymphocytic leukemia of B-cell type not having achieved remission: Secondary | ICD-10-CM

## 2019-07-14 ENCOUNTER — Other Ambulatory Visit: Payer: Self-pay | Admitting: Oncology

## 2019-07-14 DIAGNOSIS — C9110 Chronic lymphocytic leukemia of B-cell type not having achieved remission: Secondary | ICD-10-CM

## 2019-07-14 DIAGNOSIS — D649 Anemia, unspecified: Secondary | ICD-10-CM

## 2019-07-14 DIAGNOSIS — E538 Deficiency of other specified B group vitamins: Secondary | ICD-10-CM

## 2019-08-21 ENCOUNTER — Other Ambulatory Visit: Payer: Self-pay

## 2019-08-21 DIAGNOSIS — I739 Peripheral vascular disease, unspecified: Secondary | ICD-10-CM

## 2019-08-21 DIAGNOSIS — N184 Chronic kidney disease, stage 4 (severe): Secondary | ICD-10-CM

## 2019-08-21 NOTE — Addendum Note (Signed)
Addended byDoylene Bode on: 08/21/2019 09:28 AM   Modules accepted: Orders

## 2019-08-27 ENCOUNTER — Inpatient Hospital Stay: Payer: Medicare Other

## 2019-08-27 ENCOUNTER — Telehealth: Payer: Self-pay | Admitting: *Deleted

## 2019-08-27 ENCOUNTER — Inpatient Hospital Stay: Payer: Medicare Other | Admitting: Oncology

## 2019-08-27 ENCOUNTER — Telehealth: Payer: Self-pay | Admitting: Oncology

## 2019-08-27 NOTE — Telephone Encounter (Signed)
Per Dr. Benay Spice: Called patient to cancel appointments today. Scheduler will call to reschedule for 1 month. Scheduling message sent.

## 2019-08-27 NOTE — Telephone Encounter (Signed)
Scheduled appointments per 8/16 scheduling message. Patient is aware of updated appointment date and time.

## 2019-09-05 ENCOUNTER — Encounter: Payer: Self-pay | Admitting: Vascular Surgery

## 2019-09-05 ENCOUNTER — Ambulatory Visit (INDEPENDENT_AMBULATORY_CARE_PROVIDER_SITE_OTHER)
Admission: RE | Admit: 2019-09-05 | Discharge: 2019-09-05 | Disposition: A | Discharge status: Home / Self Care | Payer: Medicare Other | Source: Ambulatory Visit | Attending: Vascular Surgery | Admitting: Vascular Surgery

## 2019-09-05 ENCOUNTER — Other Ambulatory Visit: Payer: Self-pay

## 2019-09-05 ENCOUNTER — Ambulatory Visit: Payer: Medicare Other | Admitting: Vascular Surgery

## 2019-09-05 ENCOUNTER — Ambulatory Visit (HOSPITAL_COMMUNITY)
Admission: RE | Admit: 2019-09-05 | Discharge: 2019-09-05 | Disposition: A | Discharge status: Home / Self Care | Payer: Medicare Other | Source: Ambulatory Visit | Attending: Vascular Surgery | Admitting: Vascular Surgery

## 2019-09-05 ENCOUNTER — Other Ambulatory Visit (HOSPITAL_COMMUNITY)
Admission: RE | Admit: 2019-09-05 | Discharge: 2019-09-05 | Disposition: A | Discharge status: Home / Self Care | Payer: Medicare Other | Source: Ambulatory Visit | Attending: Vascular Surgery | Admitting: Vascular Surgery

## 2019-09-05 ENCOUNTER — Other Ambulatory Visit: Payer: Self-pay | Admitting: Oncology

## 2019-09-05 VITALS — BP 165/76 | HR 57 | Temp 97.8°F | Resp 20 | Ht 77.0 in | Wt 215.0 lb

## 2019-09-05 DIAGNOSIS — I129 Hypertensive chronic kidney disease with stage 1 through stage 4 chronic kidney disease, or unspecified chronic kidney disease: Secondary | ICD-10-CM | POA: Diagnosis not present

## 2019-09-05 DIAGNOSIS — Z95820 Peripheral vascular angioplasty status with implants and grafts: Secondary | ICD-10-CM | POA: Insufficient documentation

## 2019-09-05 DIAGNOSIS — N184 Chronic kidney disease, stage 4 (severe): Secondary | ICD-10-CM | POA: Insufficient documentation

## 2019-09-05 DIAGNOSIS — E1122 Type 2 diabetes mellitus with diabetic chronic kidney disease: Secondary | ICD-10-CM | POA: Insufficient documentation

## 2019-09-05 DIAGNOSIS — E785 Hyperlipidemia, unspecified: Secondary | ICD-10-CM | POA: Diagnosis not present

## 2019-09-05 DIAGNOSIS — D649 Anemia, unspecified: Secondary | ICD-10-CM

## 2019-09-05 DIAGNOSIS — E1151 Type 2 diabetes mellitus with diabetic peripheral angiopathy without gangrene: Secondary | ICD-10-CM | POA: Diagnosis present

## 2019-09-05 DIAGNOSIS — Z20822 Contact with and (suspected) exposure to covid-19: Secondary | ICD-10-CM | POA: Insufficient documentation

## 2019-09-05 DIAGNOSIS — I739 Peripheral vascular disease, unspecified: Secondary | ICD-10-CM

## 2019-09-05 DIAGNOSIS — Z87891 Personal history of nicotine dependence: Secondary | ICD-10-CM | POA: Insufficient documentation

## 2019-09-05 DIAGNOSIS — E11621 Type 2 diabetes mellitus with foot ulcer: Secondary | ICD-10-CM | POA: Insufficient documentation

## 2019-09-05 DIAGNOSIS — N186 End stage renal disease: Secondary | ICD-10-CM

## 2019-09-05 DIAGNOSIS — L97529 Non-pressure chronic ulcer of other part of left foot with unspecified severity: Secondary | ICD-10-CM | POA: Insufficient documentation

## 2019-09-05 DIAGNOSIS — C9110 Chronic lymphocytic leukemia of B-cell type not having achieved remission: Secondary | ICD-10-CM

## 2019-09-05 DIAGNOSIS — E538 Deficiency of other specified B group vitamins: Secondary | ICD-10-CM

## 2019-09-05 DIAGNOSIS — Z992 Dependence on renal dialysis: Secondary | ICD-10-CM | POA: Diagnosis not present

## 2019-09-05 LAB — SARS CORONAVIRUS 2 (TAT 6-24 HRS): SARS Coronavirus 2: NEGATIVE

## 2019-09-05 NOTE — H&P (View-Only) (Signed)
REASON FOR VISIT:   Poorly functioning AV fistula.  The consult was requested by Dr. Carolin Sicks.  MEDICAL ISSUES:   STAGE V CHRONIC KIDNEY DISEASE: His fistula has a reasonable thrill proximally within his difficulty following distally.  If this does not mature and he would require a basilic vein transposition.  As he is not yet on dialysis I would favor a fistulogram with limited contrast to see if there is any way to salvage the forearm fistula before considering a basilic vein transposition.  This has been scheduled for Friday, 09/07/2019.  We have discussed the indications for the procedure and the potential complications and he is agreeable to proceed.  PERIPHERAL VASCULAR DISEASE: This patient underwent a right femoral to below-knee popliteal artery bypass with vein in June 2020.  He has some narrowing at the distal anastomosis (50 to 74% stenosis).  He is asymptomatic.  I think we can simply follow this and I have ordered a follow-up duplex scan in 6 months and I will see him back at that time.  He knows to call sooner if he has problems.  Given that he has stage V chronic kidney disease I like to avoid giving him too much contrast and I think a CO2 arteriogram to evaluate distal vein graft stenosis may give limited images.   HPI:   Thomas Mcguire is a pleasant 79 y.o. male who has stage V chronic kidney disease.  He has had a right radiocephalic AV fistula.  He had a previous fistulogram which really did not show any significant problems.  His cephalic vein empties into the basilic vein in the upper arm.  Therefore if this failed he would require a basilic vein transposition.  Again is not yet on dialysis.  He has no specific complaints referable to his right arm.  The fistula has simply not been maturing adequately.  With respect to his peripheral vascular disease he underwent a right femoral to below-knee popliteal artery bypass with vein in June 2020.  He is currently asymptomatic.   He denies significant claudication although I suspect his activity is fairly limited.  He denies any history of rest pain or nonhealing ulcers.  He is currently not a smoker.  He is on a statin.   Past Medical History:  Diagnosis Date  . Anemia    low iron  . Arthritis   . Chronic renal insufficiency    CHECKED Q3MOS PER PT. Stage 4 as of 05/22/2018 per pt.  . Diabetes mellitus    type 2  . Gout    Has not had recently- 08/24/11  . Hyperlipidemia   . Hypertension   . 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 PAD (peripheral artery disease) ([place])   . Wears dentures     Family History  Problem Relation Age of Onset  . Colon cancer Neg Hx     SOCIAL HISTORY: Social History   Tobacco Use  . Smoking status: Former Smoker    Types: Cigarettes    Quit date: 03/11/1996    Years since quitting: 23.5  . Smokeless tobacco: Never Used  Substance Use Topics  . Alcohol use: No    No Known Allergies  Current Outpatient Medications  Medication Sig Dispense Refill  . allopurinol (ZYLOPRIM) 100 MG tablet TAKE 1 TABLET BY MOUTH EVERY MONDAY/WEDNESDAY/FRIDAY    . amLODipine (NORVASC) 5 MG tablet Take 5 mg by mouth daily.     Marland Kitchen aspirin 81 MG  tablet Take 81 mg by mouth daily.    . calcitRIOL (ROCALTROL) 0.25 MCG capsule Take 0.25 mcg by mouth daily. 3 times a week  04/23/19 per Pt.  5  . calcium acetate (PHOSLO) 667 MG capsule Take 667 mg by mouth 3 (three) times daily.    . carvedilol (COREG) 25 MG tablet Take 12.5 mg by mouth 2 (two) times daily with a meal.     . COD LIVER OIL PO Take 1 capsule by mouth 2 (two) times daily.     . cyanocobalamin (,VITAMIN B-12,) 1000 MCG/ML injection Vitamin B12 injection: 1000 mcg (1 mL) by subcu injection once daily for 7 days; then once weekly for 4 weeks; then once monthly thereafter.  7 07/15/2010 12 mL 1  . doxazosin (CARDURA) 2 MG tablet Take 2 mg by mouth every morning.     . ferrous sulfate 325 (65 FE) MG tablet Take 325 mg by  mouth 2 (two) times daily with a meal.     . folic acid (FOLVITE) 1 MG tablet TAKE 1 TABLET BY MOUTH EVERY DAY 90 tablet 0  . furosemide (LASIX) 80 MG tablet Take 1 tablet (80 mg total) by mouth 2 (two) times daily. (Patient taking differently: Take 80 mg by mouth daily. ) 60 tablet 0  . insulin NPH (HUMULIN N,NOVOLIN N) 100 UNIT/ML injection Inject 50 Units into the skin 2 (two) times daily.     . insulin regular (HUMULIN R,NOVOLIN R) 100 units/mL injection Inject 20-25 Units into the skin 3 (three) times daily before meals. Sliding Scale    . isosorbide mononitrate (IMDUR) 30 MG 24 hr tablet Take 30 mg by mouth daily.  6  . potassium chloride (K-DUR) 10 MEQ tablet Take 10 mEq by mouth daily.  3  . pravastatin (PRAVACHOL) 40 MG tablet Take 40 mg by mouth at bedtime.      No current facility-administered medications for this visit.    REVIEW OF SYSTEMS:  [X]  denotes positive finding, [ ]  denotes negative finding Cardiac  Comments:  Chest pain or chest pressure:    Shortness of breath upon exertion:    Short of breath when lying flat:    Irregular heart rhythm:        Vascular    Pain in calf, thigh, or hip brought on by ambulation:    Pain in feet at night that wakes you up from your sleep:     Blood clot in your veins:    Leg swelling:         Pulmonary    Oxygen at home:    Productive cough:     Wheezing:         Neurologic    Sudden weakness in arms or legs:     Sudden numbness in arms or legs:     Sudden onset of difficulty speaking or slurred speech:    Temporary loss of vision in one eye:     Problems with dizziness:         Gastrointestinal    Blood in stool:     Vomited blood:         Genitourinary    Burning when urinating:     Blood in urine:        Psychiatric    Major depression:         Hematologic    Bleeding problems:    Problems with blood clotting too easily:        Skin  Rashes or ulcers:        Constitutional    Fever or chills:      PHYSICAL EXAM:   Vitals:   09/05/19 1232  BP: (!) 165/76  Pulse: (!) 57  Resp: 20  Temp: 97.8 F (36.6 C)  SpO2: 97%  Weight: 97.5 kg  Height: 6\' 5"  (1.956 m)    GENERAL: The patient is a well-nourished male, in no acute distress. The vital signs are documented above. CARDIAC: There is a regular rate and rhythm.  VASCULAR: I do not detect carotid bruits. He has palpable femoral pulses. I cannot palpate pedal pulses. He has a reasonable thrill in his proximal fistula but the vein is difficult to follow distally. PULMONARY: There is good air exchange bilaterally without wheezing or rales. ABDOMEN: Soft and non-tender with normal pitched bowel sounds.  MUSCULOSKELETAL: He has had a previous right great toe amputation. NEUROLOGIC: No focal weakness or paresthesias are detected. SKIN: There are no ulcers or rashes noted. PSYCHIATRIC: The patient has a normal affect.  DATA:    DUPLEX AV FISTULA: I have independently interpreted his duplex of the AV fistula in the right arm.  Diameters of the vein ranged from 0.36-0.5 cm.  The patient is noted to have low flow volumes.  Anastomosis could not be visualized.  GRAFT DUPLEX: I have independently interpreted the patient's graft duplex.  The patient has biphasic flow throughout the graft with monophasic flow in the distal graft and the outflow.  The systolic velocity in the distal graft is 294 cm/s suggesting a 50 to 70% stenosis.  ARTERIAL DOPPLER STUDY: I have independently interpreted his arterial Doppler study today.  On the right side there is a monophasic posterior tibial signal.  ABIs greater than 100%.  This is falsely elevated because of his calcific disease.  Dorsalis pedis signal is dampened and monophasic.  On the left side there is a monophasic dorsalis pedis and posterior tibial signal.  ABIs greater than 100%.  Again the arteries are calcified and the ABIs are not reliable.    Thomas Mcguire [facility]  [facility] [phone]

## 2019-09-05 NOTE — Progress Notes (Signed)
REASON FOR VISIT:   Poorly functioning AV fistula.  The consult was requested by Dr. Carolin Sicks.  MEDICAL ISSUES:   STAGE V CHRONIC KIDNEY DISEASE: His fistula has a reasonable thrill proximally within his difficulty following distally.  If this does not mature and he would require a basilic vein transposition.  As he is not yet on dialysis I would favor a fistulogram with limited contrast to see if there is any way to salvage the forearm fistula before considering a basilic vein transposition.  This has been scheduled for Friday, 09/07/2019.  We have discussed the indications for the procedure and the potential complications and he is agreeable to proceed.  PERIPHERAL VASCULAR DISEASE: This patient underwent a right femoral to below-knee popliteal artery bypass with vein in June 2020.  He has some narrowing at the distal anastomosis (50 to 74% stenosis).  He is asymptomatic.  I think we can simply follow this and I have ordered a follow-up duplex scan in 6 months and I will see him back at that time.  He knows to call sooner if he has problems.  Given that he has stage V chronic kidney disease I like to avoid giving him too much contrast and I think a CO2 arteriogram to evaluate distal vein graft stenosis may give limited images.   HPI:   Thomas Mcguire is a pleasant 79 y.o. male who has stage V chronic kidney disease.  He has had a right radiocephalic AV fistula.  He had a previous fistulogram which really did not show any significant problems.  His cephalic vein empties into the basilic vein in the upper arm.  Therefore if this failed he would require a basilic vein transposition.  Again is not yet on dialysis.  He has no specific complaints referable to his right arm.  The fistula has simply not been maturing adequately.  With respect to his peripheral vascular disease he underwent a right femoral to below-knee popliteal artery bypass with vein in June 2020.  He is currently asymptomatic.   He denies significant claudication although I suspect his activity is fairly limited.  He denies any history of rest pain or nonhealing ulcers.  He is currently not a smoker.  He is on a statin.   Past Medical History:  Diagnosis Date  . Anemia    low iron  . Arthritis   . Chronic renal insufficiency    CHECKED Q3MOS PER PT. Stage 4 as of 05/22/2018 per pt.  . Diabetes mellitus    type 2  . Gout    Has not had recently- 08/24/11  . Hyperlipidemia   . Hypertension   . 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 PAD (peripheral artery disease) ([place])   . Wears dentures     Family History  Problem Relation Age of Onset  . Colon cancer Neg Hx     SOCIAL HISTORY: Social History   Tobacco Use  . Smoking status: Former Smoker    Types: Cigarettes    Quit date: 03/11/1996    Years since quitting: 23.5  . Smokeless tobacco: Never Used  Substance Use Topics  . Alcohol use: No    No Known Allergies  Current Outpatient Medications  Medication Sig Dispense Refill  . allopurinol (ZYLOPRIM) 100 MG tablet TAKE 1 TABLET BY MOUTH EVERY MONDAY/WEDNESDAY/FRIDAY    . amLODipine (NORVASC) 5 MG tablet Take 5 mg by mouth daily.     Marland Kitchen aspirin 81 MG  tablet Take 81 mg by mouth daily.    . calcitRIOL (ROCALTROL) 0.25 MCG capsule Take 0.25 mcg by mouth daily. 3 times a week  04/23/19 per Pt.  5  . calcium acetate (PHOSLO) 667 MG capsule Take 667 mg by mouth 3 (three) times daily.    . carvedilol (COREG) 25 MG tablet Take 12.5 mg by mouth 2 (two) times daily with a meal.     . COD LIVER OIL PO Take 1 capsule by mouth 2 (two) times daily.     . cyanocobalamin (,VITAMIN B-12,) 1000 MCG/ML injection Vitamin B12 injection: 1000 mcg (1 mL) by subcu injection once daily for 7 days; then once weekly for 4 weeks; then once monthly thereafter.  7 07/15/2010 12 mL 1  . doxazosin (CARDURA) 2 MG tablet Take 2 mg by mouth every morning.     . ferrous sulfate 325 (65 FE) MG tablet Take 325 mg by  mouth 2 (two) times daily with a meal.     . folic acid (FOLVITE) 1 MG tablet TAKE 1 TABLET BY MOUTH EVERY DAY 90 tablet 0  . furosemide (LASIX) 80 MG tablet Take 1 tablet (80 mg total) by mouth 2 (two) times daily. (Patient taking differently: Take 80 mg by mouth daily. ) 60 tablet 0  . insulin NPH (HUMULIN N,NOVOLIN N) 100 UNIT/ML injection Inject 50 Units into the skin 2 (two) times daily.     . insulin regular (HUMULIN R,NOVOLIN R) 100 units/mL injection Inject 20-25 Units into the skin 3 (three) times daily before meals. Sliding Scale    . isosorbide mononitrate (IMDUR) 30 MG 24 hr tablet Take 30 mg by mouth daily.  6  . potassium chloride (K-DUR) 10 MEQ tablet Take 10 mEq by mouth daily.  3  . pravastatin (PRAVACHOL) 40 MG tablet Take 40 mg by mouth at bedtime.      No current facility-administered medications for this visit.    REVIEW OF SYSTEMS:  [X]  denotes positive finding, [ ]  denotes negative finding Cardiac  Comments:  Chest pain or chest pressure:    Shortness of breath upon exertion:    Short of breath when lying flat:    Irregular heart rhythm:        Vascular    Pain in calf, thigh, or hip brought on by ambulation:    Pain in feet at night that wakes you up from your sleep:     Blood clot in your veins:    Leg swelling:         Pulmonary    Oxygen at home:    Productive cough:     Wheezing:         Neurologic    Sudden weakness in arms or legs:     Sudden numbness in arms or legs:     Sudden onset of difficulty speaking or slurred speech:    Temporary loss of vision in one eye:     Problems with dizziness:         Gastrointestinal    Blood in stool:     Vomited blood:         Genitourinary    Burning when urinating:     Blood in urine:        Psychiatric    Major depression:         Hematologic    Bleeding problems:    Problems with blood clotting too easily:        Skin  Rashes or ulcers:        Constitutional    Fever or chills:      PHYSICAL EXAM:   Vitals:   09/05/19 1232  BP: (!) 165/76  Pulse: (!) 57  Resp: 20  Temp: 97.8 F (36.6 C)  SpO2: 97%  Weight: 97.5 kg  Height: 6\' 5"  (1.956 m)    GENERAL: The patient is a well-nourished male, in no acute distress. The vital signs are documented above. CARDIAC: There is a regular rate and rhythm.  VASCULAR: I do not detect carotid bruits. He has palpable femoral pulses. I cannot palpate pedal pulses. He has a reasonable thrill in his proximal fistula but the vein is difficult to follow distally. PULMONARY: There is good air exchange bilaterally without wheezing or rales. ABDOMEN: Soft and non-tender with normal pitched bowel sounds.  MUSCULOSKELETAL: He has had a previous right great toe amputation. NEUROLOGIC: No focal weakness or paresthesias are detected. SKIN: There are no ulcers or rashes noted. PSYCHIATRIC: The patient has a normal affect.  DATA:    DUPLEX AV FISTULA: I have independently interpreted his duplex of the AV fistula in the right arm.  Diameters of the vein ranged from 0.36-0.5 cm.  The patient is noted to have low flow volumes.  Anastomosis could not be visualized.  GRAFT DUPLEX: I have independently interpreted the patient's graft duplex.  The patient has biphasic flow throughout the graft with monophasic flow in the distal graft and the outflow.  The systolic velocity in the distal graft is 294 cm/s suggesting a 50 to 70% stenosis.  ARTERIAL DOPPLER STUDY: I have independently interpreted his arterial Doppler study today.  On the right side there is a monophasic posterior tibial signal.  ABIs greater than 100%.  This is falsely elevated because of his calcific disease.  Dorsalis pedis signal is dampened and monophasic.  On the left side there is a monophasic dorsalis pedis and posterior tibial signal.  ABIs greater than 100%.  Again the arteries are calcified and the ABIs are not reliable.    Deitra Mayo Vascular and Vein  [facility] [phone]

## 2019-09-05 NOTE — H&P (View-Only) (Signed)
REASON FOR VISIT:   Poorly functioning AV fistula.  The consult was requested by Dr. Carolin Sicks.  MEDICAL ISSUES:   STAGE V CHRONIC KIDNEY DISEASE: His fistula has a reasonable thrill proximally within his difficulty following distally.  If this does not mature and he would require a basilic vein transposition.  As he is not yet on dialysis I would favor a fistulogram with limited contrast to see if there is any way to salvage the forearm fistula before considering a basilic vein transposition.  This has been scheduled for Friday, 09/07/2019.  We have discussed the indications for the procedure and the potential complications and he is agreeable to proceed.  PERIPHERAL VASCULAR DISEASE: This patient underwent a right femoral to below-knee popliteal artery bypass with vein in June 2020.  He has some narrowing at the distal anastomosis (50 to 74% stenosis).  He is asymptomatic.  I think we can simply follow this and I have ordered a follow-up duplex scan in 6 months and I will see him back at that time.  He knows to call sooner if he has problems.  Given that he has stage V chronic kidney disease I like to avoid giving him too much contrast and I think a CO2 arteriogram to evaluate distal vein graft stenosis may give limited images.   HPI:   Thomas Mcguire is a pleasant 79 y.o. male who has stage V chronic kidney disease.  He has had a right radiocephalic AV fistula.  He had a previous fistulogram which really did not show any significant problems.  His cephalic vein empties into the basilic vein in the upper arm.  Therefore if this failed he would require a basilic vein transposition.  Again is not yet on dialysis.  He has no specific complaints referable to his right arm.  The fistula has simply not been maturing adequately.  With respect to his peripheral vascular disease he underwent a right femoral to below-knee popliteal artery bypass with vein in June 2020.  He is currently asymptomatic.   He denies significant claudication although I suspect his activity is fairly limited.  He denies any history of rest pain or nonhealing ulcers.  He is currently not a smoker.  He is on a statin.   Past Medical History:  Diagnosis Date  . Anemia    low iron  . Arthritis   . Chronic renal insufficiency    CHECKED Q3MOS PER PT. Stage 4 as of 05/22/2018 per pt.  . Diabetes mellitus    type 2  . Gout    Has not had recently- 08/24/11  . Hyperlipidemia   . Hypertension   . 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 PAD (peripheral artery disease) ([place])   . Wears dentures     Family History  Problem Relation Age of Onset  . Colon cancer Neg Hx     SOCIAL HISTORY: Social History   Tobacco Use  . Smoking status: Former Smoker    Types: Cigarettes    Quit date: 03/11/1996    Years since quitting: 23.5  . Smokeless tobacco: Never Used  Substance Use Topics  . Alcohol use: No    No Known Allergies  Current Outpatient Medications  Medication Sig Dispense Refill  . allopurinol (ZYLOPRIM) 100 MG tablet TAKE 1 TABLET BY MOUTH EVERY MONDAY/WEDNESDAY/FRIDAY    . amLODipine (NORVASC) 5 MG tablet Take 5 mg by mouth daily.     Marland Kitchen aspirin 81 MG  tablet Take 81 mg by mouth daily.    . calcitRIOL (ROCALTROL) 0.25 MCG capsule Take 0.25 mcg by mouth daily. 3 times a week  04/23/19 per Pt.  5  . calcium acetate (PHOSLO) 667 MG capsule Take 667 mg by mouth 3 (three) times daily.    . carvedilol (COREG) 25 MG tablet Take 12.5 mg by mouth 2 (two) times daily with a meal.     . COD LIVER OIL PO Take 1 capsule by mouth 2 (two) times daily.     . cyanocobalamin (,VITAMIN B-12,) 1000 MCG/ML injection Vitamin B12 injection: 1000 mcg (1 mL) by subcu injection once daily for 7 days; then once weekly for 4 weeks; then once monthly thereafter.  7 07/15/2010 12 mL 1  . doxazosin (CARDURA) 2 MG tablet Take 2 mg by mouth every morning.     . ferrous sulfate 325 (65 FE) MG tablet Take 325 mg by  mouth 2 (two) times daily with a meal.     . folic acid (FOLVITE) 1 MG tablet TAKE 1 TABLET BY MOUTH EVERY DAY 90 tablet 0  . furosemide (LASIX) 80 MG tablet Take 1 tablet (80 mg total) by mouth 2 (two) times daily. (Patient taking differently: Take 80 mg by mouth daily. ) 60 tablet 0  . insulin NPH (HUMULIN N,NOVOLIN N) 100 UNIT/ML injection Inject 50 Units into the skin 2 (two) times daily.     . insulin regular (HUMULIN R,NOVOLIN R) 100 units/mL injection Inject 20-25 Units into the skin 3 (three) times daily before meals. Sliding Scale    . isosorbide mononitrate (IMDUR) 30 MG 24 hr tablet Take 30 mg by mouth daily.  6  . potassium chloride (K-DUR) 10 MEQ tablet Take 10 mEq by mouth daily.  3  . pravastatin (PRAVACHOL) 40 MG tablet Take 40 mg by mouth at bedtime.      No current facility-administered medications for this visit.    REVIEW OF SYSTEMS:  [X]  denotes positive finding, [ ]  denotes negative finding Cardiac  Comments:  Chest pain or chest pressure:    Shortness of breath upon exertion:    Short of breath when lying flat:    Irregular heart rhythm:        Vascular    Pain in calf, thigh, or hip brought on by ambulation:    Pain in feet at night that wakes you up from your sleep:     Blood clot in your veins:    Leg swelling:         Pulmonary    Oxygen at home:    Productive cough:     Wheezing:         Neurologic    Sudden weakness in arms or legs:     Sudden numbness in arms or legs:     Sudden onset of difficulty speaking or slurred speech:    Temporary loss of vision in one eye:     Problems with dizziness:         Gastrointestinal    Blood in stool:     Vomited blood:         Genitourinary    Burning when urinating:     Blood in urine:        Psychiatric    Major depression:         Hematologic    Bleeding problems:    Problems with blood clotting too easily:        Skin  Rashes or ulcers:        Constitutional    Fever or chills:      PHYSICAL EXAM:   Vitals:   09/05/19 1232  BP: (!) 165/76  Pulse: (!) 57  Resp: 20  Temp: 97.8 F (36.6 C)  SpO2: 97%  Weight: 97.5 kg  Height: 6\' 5"  (1.956 m)    GENERAL: The patient is a well-nourished male, in no acute distress. The vital signs are documented above. CARDIAC: There is a regular rate and rhythm.  VASCULAR: I do not detect carotid bruits. He has palpable femoral pulses. I cannot palpate pedal pulses. He has a reasonable thrill in his proximal fistula but the vein is difficult to follow distally. PULMONARY: There is good air exchange bilaterally without wheezing or rales. ABDOMEN: Soft and non-tender with normal pitched bowel sounds.  MUSCULOSKELETAL: He has had a previous right great toe amputation. NEUROLOGIC: No focal weakness or paresthesias are detected. SKIN: There are no ulcers or rashes noted. PSYCHIATRIC: The patient has a normal affect.  DATA:    DUPLEX AV FISTULA: I have independently interpreted his duplex of the AV fistula in the right arm.  Diameters of the vein ranged from 0.36-0.5 cm.  The patient is noted to have low flow volumes.  Anastomosis could not be visualized.  GRAFT DUPLEX: I have independently interpreted the patient's graft duplex.  The patient has biphasic flow throughout the graft with monophasic flow in the distal graft and the outflow.  The systolic velocity in the distal graft is 294 cm/s suggesting a 50 to 70% stenosis.  ARTERIAL DOPPLER STUDY: I have independently interpreted his arterial Doppler study today.  On the right side there is a monophasic posterior tibial signal.  ABIs greater than 100%.  This is falsely elevated because of his calcific disease.  Dorsalis pedis signal is dampened and monophasic.  On the left side there is a monophasic dorsalis pedis and posterior tibial signal.  ABIs greater than 100%.  Again the arteries are calcified and the ABIs are not reliable.    Deitra Mayo Vascular and Vein  [facility] [phone]

## 2019-09-06 ENCOUNTER — Other Ambulatory Visit: Payer: Self-pay | Admitting: *Deleted

## 2019-09-06 ENCOUNTER — Telehealth: Payer: Self-pay

## 2019-09-06 DIAGNOSIS — I739 Peripheral vascular disease, unspecified: Secondary | ICD-10-CM

## 2019-09-06 NOTE — Telephone Encounter (Signed)
Spoke with pt regarding change in arrival time to 0800AM on tomorrow, 09/07/19 for fistulogram procedure with Dr. Scot Dock. Pt verbalized understanding.

## 2019-09-07 ENCOUNTER — Ambulatory Visit (HOSPITAL_COMMUNITY)
Admission: RE | Disposition: A | Discharge status: Home / Self Care | Payer: Self-pay | Source: Home / Self Care | Attending: Vascular Surgery

## 2019-09-07 ENCOUNTER — Other Ambulatory Visit: Payer: Self-pay

## 2019-09-07 ENCOUNTER — Encounter (HOSPITAL_COMMUNITY): Payer: Self-pay | Admitting: Vascular Surgery

## 2019-09-07 ENCOUNTER — Ambulatory Visit (HOSPITAL_COMMUNITY)
Admission: RE | Admit: 2019-09-07 | Discharge: 2019-09-07 | Disposition: A | Discharge status: Home / Self Care | Payer: Medicare Other | Attending: Vascular Surgery | Admitting: Vascular Surgery

## 2019-09-07 DIAGNOSIS — Z79899 Other long term (current) drug therapy: Secondary | ICD-10-CM | POA: Diagnosis not present

## 2019-09-07 DIAGNOSIS — E1140 Type 2 diabetes mellitus with diabetic neuropathy, unspecified: Secondary | ICD-10-CM | POA: Diagnosis not present

## 2019-09-07 DIAGNOSIS — M109 Gout, unspecified: Secondary | ICD-10-CM | POA: Diagnosis not present

## 2019-09-07 DIAGNOSIS — E785 Hyperlipidemia, unspecified: Secondary | ICD-10-CM | POA: Insufficient documentation

## 2019-09-07 DIAGNOSIS — I120 Hypertensive chronic kidney disease with stage 5 chronic kidney disease or end stage renal disease: Secondary | ICD-10-CM | POA: Diagnosis not present

## 2019-09-07 DIAGNOSIS — Z7982 Long term (current) use of aspirin: Secondary | ICD-10-CM | POA: Insufficient documentation

## 2019-09-07 DIAGNOSIS — Y832 Surgical operation with anastomosis, bypass or graft as the cause of abnormal reaction of the patient, or of later complication, without mention of misadventure at the time of the procedure: Secondary | ICD-10-CM | POA: Diagnosis not present

## 2019-09-07 DIAGNOSIS — T82510A Breakdown (mechanical) of surgically created arteriovenous fistula, initial encounter: Secondary | ICD-10-CM | POA: Insufficient documentation

## 2019-09-07 DIAGNOSIS — Z87891 Personal history of nicotine dependence: Secondary | ICD-10-CM | POA: Insufficient documentation

## 2019-09-07 DIAGNOSIS — N185 Chronic kidney disease, stage 5: Secondary | ICD-10-CM

## 2019-09-07 DIAGNOSIS — T82898A Other specified complication of vascular prosthetic devices, implants and grafts, initial encounter: Secondary | ICD-10-CM

## 2019-09-07 DIAGNOSIS — E1122 Type 2 diabetes mellitus with diabetic chronic kidney disease: Secondary | ICD-10-CM | POA: Insufficient documentation

## 2019-09-07 DIAGNOSIS — Z794 Long term (current) use of insulin: Secondary | ICD-10-CM | POA: Insufficient documentation

## 2019-09-07 DIAGNOSIS — D509 Iron deficiency anemia, unspecified: Secondary | ICD-10-CM | POA: Insufficient documentation

## 2019-09-07 DIAGNOSIS — N189 Chronic kidney disease, unspecified: Secondary | ICD-10-CM | POA: Diagnosis present

## 2019-09-07 DIAGNOSIS — E1151 Type 2 diabetes mellitus with diabetic peripheral angiopathy without gangrene: Secondary | ICD-10-CM | POA: Insufficient documentation

## 2019-09-07 HISTORY — PX: A/V FISTULAGRAM: CATH118298

## 2019-09-07 LAB — POCT I-STAT, CHEM 8
BUN: 72 mg/dL — ABNORMAL HIGH (ref 8–23)
Calcium, Ion: 1.23 mmol/L (ref 1.15–1.40)
Chloride: 102 mmol/L (ref 98–111)
Creatinine, Ser: 6 mg/dL — ABNORMAL HIGH (ref 0.61–1.24)
Glucose, Bld: 97 mg/dL (ref 70–99)
HCT: 37 % — ABNORMAL LOW (ref 39.0–52.0)
Hemoglobin: 12.6 g/dL — ABNORMAL LOW (ref 13.0–17.0)
Potassium: 4 mmol/L (ref 3.5–5.1)
Sodium: 143 mmol/L (ref 135–145)
TCO2: 28 mmol/L (ref 22–32)

## 2019-09-07 SURGERY — A/V FISTULAGRAM
Anesthesia: LOCAL

## 2019-09-07 MED ORDER — SODIUM CHLORIDE 0.9 % IV SOLN: INTRAVENOUS | Status: DC

## 2019-09-07 MED ORDER — LIDOCAINE HCL (PF) 1 % IJ SOLN
INTRAMUSCULAR | Status: AC
  Filled 2019-09-07: qty 30

## 2019-09-07 MED ORDER — IODIXANOL 320 MG/ML IV SOLN
INTRAVENOUS | Status: DC | PRN
  Administered 2019-09-07: 9 mL

## 2019-09-07 MED ORDER — HEPARIN (PORCINE) IN NACL 1000-0.9 UT/500ML-% IV SOLN
INTRAVENOUS | Status: AC
  Filled 2019-09-07: qty 500

## 2019-09-07 MED ORDER — LIDOCAINE HCL (PF) 1 % IJ SOLN
INTRAMUSCULAR | Status: DC | PRN
  Administered 2019-09-07: 2 mL via INTRADERMAL

## 2019-09-07 SURGICAL SUPPLY — 9 items
BAG SNAP BAND KOVER 36X36 (MISCELLANEOUS) ×2 IMPLANT
COVER DOME SNAP 22 D (MISCELLANEOUS) ×2 IMPLANT
KIT MICROPUNCTURE NIT STIFF (SHEATH) ×2 IMPLANT
PROTECTION STATION PRESSURIZED (MISCELLANEOUS) ×2
SHEATH PROBE COVER 6X72 (BAG) ×2 IMPLANT
STATION PROTECTION PRESSURIZED (MISCELLANEOUS) ×1 IMPLANT
STOPCOCK MORSE 400PSI 3WAY (MISCELLANEOUS) ×2 IMPLANT
TRAY PV CATH (CUSTOM PROCEDURE TRAY) ×2 IMPLANT
TUBING CIL FLEX 10 FLL-RA (TUBING) ×2 IMPLANT

## 2019-09-07 NOTE — Op Note (Signed)
   PATIENT: Thomas Mcguire      MRN: [id number] DOB: Jan 31, 1940    DATE OF PROCEDURE: 09/07/2019  INDICATIONS:    Thomas Mcguire is a 79 y.o. male who is not yet on dialysis.  He has a right radiocephalic fistula which is not maturing adequately.  He presents for a fistulogram.  PROCEDURE:    1.  Ultrasound-guided access to right radiocephalic fistula 2.  Fistulogram right radiocephalic fistula  SURGEON: Judeth Cornfield. Scot Dock, MD, FACS  ANESTHESIA: None  EBL: Minimal  TECHNIQUE: The patient was taken to the peripheral vascular lab and the right arm was prepped and draped in usual sterile fashion.  Under ultrasound guidance, after the skin was anesthetized, I cannulated the proximal fistula with a micropuncture needle and a micropuncture sheath was introduced over a wire.  Contrast was then injected (half-strength) through the micropuncture sheath.  There was a long narrow segment within the vein.  Next an additional shot was to evaluate the basilic vein.  A total of 9 cc of contrast were used.  FINDINGS:   1.  There is a long very narrow segment in the proximal fistula.  I do not think this is amenable to venoplasty.  The vein is simply too small.  The cephalic vein in the forearm empties into the basilic system. 2.  The upper arm brachial vein is patent.  The basilic vein is patent although somewhat marginal in size.  CLINICAL NOTE: I do not think this fistula is salvageable.  I would recommend a for stage basilic vein transposition on the right given that the vein is somewhat marginal in size.  I would not place a graft at this time given that he is not on dialysis.   Deitra Mayo, MD, FACS [facility]  DATE OF DICTATION:   09/07/2019

## 2019-09-07 NOTE — Discharge Instructions (Signed)

## 2019-09-07 NOTE — Interval H&P Note (Signed)
History and Physical Interval Note:  09/07/2019 9:55 AM  Thomas Mcguire  has presented today for surgery, with the diagnosis of kidney disease.  The various methods of treatment have been discussed with the patient and family. After consideration of risks, benefits and other options for treatment, the patient has consented to  Procedure(s): A/V FISTULAGRAM - Right AVF (N/A) as a surgical intervention.  The patient's history has been reviewed, patient examined, no change in status, stable for surgery.  I have reviewed the patient's chart and labs.  Questions were answered to the patient's satisfaction.     Deitra Mayo

## 2019-09-10 MED FILL — Heparin Sod (Porcine)-NaCl IV Soln 1000 Unit/500ML-0.9%: INTRAVENOUS | Qty: 500 | Status: AC

## 2019-09-11 ENCOUNTER — Other Ambulatory Visit: Payer: Self-pay

## 2019-09-24 ENCOUNTER — Telehealth: Payer: Self-pay | Admitting: Oncology

## 2019-09-24 ENCOUNTER — Inpatient Hospital Stay: Payer: Medicare Other

## 2019-09-24 ENCOUNTER — Other Ambulatory Visit: Payer: Self-pay

## 2019-09-24 ENCOUNTER — Inpatient Hospital Stay: Payer: Medicare Other | Attending: Oncology

## 2019-09-24 ENCOUNTER — Inpatient Hospital Stay: Payer: Medicare Other | Admitting: Oncology

## 2019-09-24 VITALS — BP 166/53 | HR 51 | Temp 97.9°F | Resp 18 | Ht 78.0 in | Wt 212.2 lb

## 2019-09-24 DIAGNOSIS — N189 Chronic kidney disease, unspecified: Secondary | ICD-10-CM | POA: Diagnosis not present

## 2019-09-24 DIAGNOSIS — D696 Thrombocytopenia, unspecified: Secondary | ICD-10-CM | POA: Diagnosis not present

## 2019-09-24 DIAGNOSIS — Z23 Encounter for immunization: Secondary | ICD-10-CM

## 2019-09-24 DIAGNOSIS — C9110 Chronic lymphocytic leukemia of B-cell type not having achieved remission: Secondary | ICD-10-CM | POA: Diagnosis present

## 2019-09-24 DIAGNOSIS — D649 Anemia, unspecified: Secondary | ICD-10-CM | POA: Diagnosis not present

## 2019-09-24 DIAGNOSIS — I129 Hypertensive chronic kidney disease with stage 1 through stage 4 chronic kidney disease, or unspecified chronic kidney disease: Secondary | ICD-10-CM | POA: Diagnosis not present

## 2019-09-24 DIAGNOSIS — E1122 Type 2 diabetes mellitus with diabetic chronic kidney disease: Secondary | ICD-10-CM | POA: Insufficient documentation

## 2019-09-24 LAB — CBC WITH DIFFERENTIAL (CANCER CENTER ONLY)
Abs Immature Granulocytes: 0.04 10*3/uL (ref 0.00–0.07)
Basophils Absolute: 0.1 10*3/uL (ref 0.0–0.1)
Basophils Relative: 0 %
Eosinophils Absolute: 0.2 10*3/uL (ref 0.0–0.5)
Eosinophils Relative: 1 %
HCT: 36.4 % — ABNORMAL LOW (ref 39.0–52.0)
Hemoglobin: 11.5 g/dL — ABNORMAL LOW (ref 13.0–17.0)
Immature Granulocytes: 0 %
Lymphocytes Relative: 64 %
Lymphs Abs: 18.4 10*3/uL — ABNORMAL HIGH (ref 0.7–4.0)
MCH: 27 pg (ref 26.0–34.0)
MCHC: 31.6 g/dL (ref 30.0–36.0)
MCV: 85.4 fL (ref 80.0–100.0)
Monocytes Absolute: 5.1 10*3/uL — ABNORMAL HIGH (ref 0.1–1.0)
Monocytes Relative: 17 %
Neutro Abs: 5.3 10*3/uL (ref 1.7–7.7)
Neutrophils Relative %: 18 %
Platelet Count: 175 10*3/uL (ref 150–400)
RBC: 4.26 MIL/uL (ref 4.22–5.81)
RDW: 15.5 % (ref 11.5–15.5)
WBC Count: 29.1 10*3/uL — ABNORMAL HIGH (ref 4.0–10.5)
nRBC: 0 % (ref 0.0–0.2)

## 2019-09-24 NOTE — Telephone Encounter (Signed)
Scheduled appointment per 9/13 los. Patient is aware of upcoming appointments.

## 2019-09-24 NOTE — Progress Notes (Signed)
° °  Covid-19 Vaccination Clinic  Name:  TERRILL ALPERIN    MRN: [id number] DOB: 06/17/1940  09/24/2019  Mr. Epple was observed post Covid-19 immunization for 15 minutes without incident. He was provided with Vaccine Information Sheet and instruction to access the V-Safe system.   Mr. Lavell was instructed to call 911 with any severe reactions post vaccine:  Difficulty breathing   Swelling of face and throat   A fast heartbeat   A bad rash all over body   Dizziness and weakness    Provided vaccine information.

## 2019-09-24 NOTE — Progress Notes (Signed)
  [place] OFFICE PROGRESS NOTE   Diagnosis: CLL  INTERVAL HISTORY:   Thomas Mcguire returns as scheduled.  He feels well.  No fever or night sweats.  Good appetite.  No change in palpable lymph nodes.  No complaint.  He received the second [place] Covid vaccine in March.  No recent infection.  Objective:  Vital signs in last 24 hours:  Blood pressure (!) 166/53, pulse (!) 51, temperature 97.9 F (36.6 C), temperature source Tympanic, resp. rate 18, height 6\' 6"  (1.981 m), weight 212 lb 3.2 oz (96.3 kg), SpO2 100 %.    Lymphatics: 1 cm left high posterior cervical versus occipital node, bilateral 1/2-1 cm cervical nodes, 2 cm axillary nodes, 1 cm left inguinal node, 1-2 cm right femoral node? Resp: Lungs clear bilaterally Cardio: Regular rate and rhythm GI: No hepatosplenomegaly, no mass Vascular: No leg edema, right lower arm dialysis fistula   Lab Results:  Lab Results  Component Value Date   WBC 29.1 (H) 09/24/2019   HGB 11.5 (L) 09/24/2019   HCT 36.4 (L) 09/24/2019   MCV 85.4 09/24/2019   PLT 175 09/24/2019   NEUTROABS 5.3 09/24/2019    CMP  Lab Results  Component Value Date   NA 143 09/07/2019   K 4.0 09/07/2019   CL 102 09/07/2019   CO2 21 (L) 09/20/2018   GLUCOSE 97 09/07/2019   BUN 72 (H) 09/07/2019   CREATININE 6.00 (H) 09/07/2019   CALCIUM 9.0 09/20/2018   PROT 7.0 07/11/2018   ALBUMIN 3.8 07/11/2018   AST 17 07/11/2018   ALT 10 07/11/2018   ALKPHOS 58 07/11/2018   BILITOT 0.7 07/11/2018   GFRNONAA 11 (L) 09/20/2018   GFRAA 12 (L) 09/20/2018    Medications: I have reviewed the patient's current medications.   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  Thrombocytopenia    Disposition: Thomas Mcguire has chronic lymphocytic leukemia.  He is asymptomatic from the CLL.  He is stable from a hematologic  standpoint.  There is no indication for treating the CLL at present.  He will receive a COVID-19 booster vaccine today.  I encouraged him to obtain the influenza vaccine.  He will return for an office and lab visit in 4 months.  Betsy Coder, MD  09/24/2019  10:34 AM

## 2019-09-27 ENCOUNTER — Other Ambulatory Visit: Payer: Self-pay

## 2019-09-27 NOTE — Progress Notes (Signed)
Pre-op instructions given to patient, including arrival time,date and location. Instructed on NPO after midnight except medications that were instructed  To take. If blood sugar <70 , not to take insulin and drink 1/2 cup juice and recheck blood sugar in 15 mins, if not > 70 to call Short Stay for further instructions. Denies chest pain/SOB. Pt to get COVID  Swab 09/29/19. Instructed to quarantine.

## 2019-09-29 ENCOUNTER — Other Ambulatory Visit (HOSPITAL_COMMUNITY)
Admission: RE | Admit: 2019-09-29 | Discharge: 2019-09-29 | Disposition: A | Discharge status: Home / Self Care | Payer: Medicare Other | Source: Ambulatory Visit | Attending: Vascular Surgery | Admitting: Vascular Surgery

## 2019-09-29 DIAGNOSIS — Z20822 Contact with and (suspected) exposure to covid-19: Secondary | ICD-10-CM | POA: Insufficient documentation

## 2019-09-29 DIAGNOSIS — Z01812 Encounter for preprocedural laboratory examination: Secondary | ICD-10-CM | POA: Diagnosis present

## 2019-09-29 LAB — SARS CORONAVIRUS 2 (TAT 6-24 HRS): SARS Coronavirus 2: NEGATIVE

## 2019-09-30 NOTE — Anesthesia Preprocedure Evaluation (Addendum)
Anesthesia Evaluation  Patient identified by MRN, date of birth, ID band Patient awake    Reviewed: Allergy & Precautions, NPO status , Patient's Chart, lab work & pertinent test results  Airway Mallampati: II  TM Distance: >3 FB     Dental   Pulmonary former smoker,    breath sounds clear to auscultation       Cardiovascular hypertension, + Peripheral Vascular Disease   Rhythm:Regular Rate:Normal     Neuro/Psych    GI/Hepatic negative GI ROS, Neg liver ROS,   Endo/Other  diabetes  Renal/GU Renal disease     Musculoskeletal   Abdominal   Peds  Hematology   Anesthesia Other Findings   Reproductive/Obstetrics                            Anesthesia Physical Anesthesia Plan  ASA: III  Anesthesia Plan: MAC   Post-op Pain Management:    Induction: Intravenous  PONV Risk Score and Plan: 2 and Ondansetron, Dexamethasone and Midazolam  Airway Management Planned: Simple Face Mask  Additional Equipment:   Intra-op Plan:   Post-operative Plan:   Informed Consent: I have reviewed the patients History and Physical, chart, labs and discussed the procedure including the risks, benefits and alternatives for the proposed anesthesia with the patient or authorized representative who has indicated his/her understanding and acceptance.     Dental advisory given  Plan Discussed with: CRNA and Anesthesiologist  Anesthesia Plan Comments:        Anesthesia Quick Evaluation

## 2019-10-01 ENCOUNTER — Other Ambulatory Visit: Payer: Self-pay

## 2019-10-01 ENCOUNTER — Ambulatory Visit (HOSPITAL_COMMUNITY): Payer: Medicare Other | Admitting: Anesthesiology

## 2019-10-01 ENCOUNTER — Encounter (HOSPITAL_COMMUNITY): Payer: Self-pay | Admitting: Vascular Surgery

## 2019-10-01 ENCOUNTER — Encounter (HOSPITAL_COMMUNITY)
Admission: RE | Disposition: A | Discharge status: Home / Self Care | Payer: Self-pay | Source: Home / Self Care | Attending: Vascular Surgery

## 2019-10-01 ENCOUNTER — Ambulatory Visit (HOSPITAL_COMMUNITY)
Admission: RE | Admit: 2019-10-01 | Discharge: 2019-10-01 | Disposition: A | Discharge status: Home / Self Care | Payer: Medicare Other | Attending: Vascular Surgery | Admitting: Vascular Surgery

## 2019-10-01 DIAGNOSIS — E785 Hyperlipidemia, unspecified: Secondary | ICD-10-CM | POA: Insufficient documentation

## 2019-10-01 DIAGNOSIS — M109 Gout, unspecified: Secondary | ICD-10-CM | POA: Insufficient documentation

## 2019-10-01 DIAGNOSIS — E1151 Type 2 diabetes mellitus with diabetic peripheral angiopathy without gangrene: Secondary | ICD-10-CM | POA: Diagnosis not present

## 2019-10-01 DIAGNOSIS — D649 Anemia, unspecified: Secondary | ICD-10-CM | POA: Diagnosis not present

## 2019-10-01 DIAGNOSIS — T82898A Other specified complication of vascular prosthetic devices, implants and grafts, initial encounter: Secondary | ICD-10-CM

## 2019-10-01 DIAGNOSIS — I120 Hypertensive chronic kidney disease with stage 5 chronic kidney disease or end stage renal disease: Secondary | ICD-10-CM | POA: Insufficient documentation

## 2019-10-01 DIAGNOSIS — Z794 Long term (current) use of insulin: Secondary | ICD-10-CM | POA: Insufficient documentation

## 2019-10-01 DIAGNOSIS — N185 Chronic kidney disease, stage 5: Secondary | ICD-10-CM | POA: Insufficient documentation

## 2019-10-01 DIAGNOSIS — Z79899 Other long term (current) drug therapy: Secondary | ICD-10-CM | POA: Insufficient documentation

## 2019-10-01 DIAGNOSIS — Z89411 Acquired absence of right great toe: Secondary | ICD-10-CM | POA: Diagnosis not present

## 2019-10-01 DIAGNOSIS — M1990 Unspecified osteoarthritis, unspecified site: Secondary | ICD-10-CM | POA: Diagnosis not present

## 2019-10-01 DIAGNOSIS — E1122 Type 2 diabetes mellitus with diabetic chronic kidney disease: Secondary | ICD-10-CM | POA: Diagnosis not present

## 2019-10-01 DIAGNOSIS — Z7982 Long term (current) use of aspirin: Secondary | ICD-10-CM | POA: Insufficient documentation

## 2019-10-01 DIAGNOSIS — E1140 Type 2 diabetes mellitus with diabetic neuropathy, unspecified: Secondary | ICD-10-CM | POA: Insufficient documentation

## 2019-10-01 DIAGNOSIS — Z87891 Personal history of nicotine dependence: Secondary | ICD-10-CM | POA: Insufficient documentation

## 2019-10-01 HISTORY — PX: LIGATION OF ARTERIOVENOUS  FISTULA: SHX5948

## 2019-10-01 HISTORY — PX: BASCILIC VEIN TRANSPOSITION: SHX5742

## 2019-10-01 LAB — POCT I-STAT, CHEM 8
BUN: >130 mg/dL — ABNORMAL HIGH (ref 8–23)
Calcium, Ion: 1.32 mmol/L (ref 1.15–1.40)
Chloride: 95 mmol/L — ABNORMAL LOW (ref 98–111)
Creatinine, Ser: 6.8 mg/dL — ABNORMAL HIGH (ref 0.61–1.24)
Glucose, Bld: 128 mg/dL — ABNORMAL HIGH (ref 70–99)
HCT: 40 % (ref 39.0–52.0)
Hemoglobin: 13.6 g/dL (ref 13.0–17.0)
Potassium: 3.6 mmol/L (ref 3.5–5.1)
Sodium: 137 mmol/L (ref 135–145)
TCO2: 27 mmol/L (ref 22–32)

## 2019-10-01 LAB — GLUCOSE, CAPILLARY
Glucose-Capillary: 145 mg/dL — ABNORMAL HIGH (ref 70–99)
Glucose-Capillary: 124 mg/dL — ABNORMAL HIGH (ref 70–99)

## 2019-10-01 SURGERY — TRANSPOSITION, VEIN, BASILIC
Anesthesia: Monitor Anesthesia Care | Site: Wrist | Laterality: Right

## 2019-10-01 MED ORDER — HYDROCODONE-ACETAMINOPHEN 5-325 MG PO TABS
1.0000 | ORAL_TABLET | Freq: Four times a day (QID) | ORAL | 0 refills | Status: DC | PRN
Start: 2019-10-01 — End: 2019-10-21

## 2019-10-01 MED ORDER — PHENYLEPHRINE HCL-NACL 10-0.9 MG/250ML-% IV SOLN
INTRAVENOUS | Status: DC | PRN
  Administered 2019-10-01: 25 ug/min via INTRAVENOUS

## 2019-10-01 MED ORDER — CHLORHEXIDINE GLUCONATE 0.12 % MT SOLN
15.0000 mL | Freq: Once | OROMUCOSAL | Status: AC
  Administered 2019-10-01: 15 mL via OROMUCOSAL

## 2019-10-01 MED ORDER — CEFAZOLIN SODIUM-DEXTROSE 2-4 GM/100ML-% IV SOLN
2.0000 g | INTRAVENOUS | Status: AC
  Administered 2019-10-01: 2 g via INTRAVENOUS
  Filled 2019-10-01: qty 100

## 2019-10-01 MED ORDER — CHLORHEXIDINE GLUCONATE 4 % EX LIQD: 60.0000 mL | Freq: Once | CUTANEOUS | Status: DC

## 2019-10-01 MED ORDER — LIDOCAINE HCL (PF) 1 % IJ SOLN
INTRAMUSCULAR | Status: AC
  Filled 2019-10-01: qty 30

## 2019-10-01 MED ORDER — ALBUMIN HUMAN 5 % IV SOLN: INTRAVENOUS | Status: DC | PRN

## 2019-10-01 MED ORDER — EPHEDRINE SULFATE-NACL 50-0.9 MG/10ML-% IV SOSY
PREFILLED_SYRINGE | INTRAVENOUS | Status: DC | PRN
  Administered 2019-10-01 (×3): 15 mg via INTRAVENOUS
  Administered 2019-10-01: 5 mg via INTRAVENOUS

## 2019-10-01 MED ORDER — EPHEDRINE 5 MG/ML INJ
INTRAVENOUS | Status: AC
  Filled 2019-10-01: qty 10

## 2019-10-01 MED ORDER — FENTANYL CITRATE (PF) 250 MCG/5ML IJ SOLN
INTRAMUSCULAR | Status: AC
  Filled 2019-10-01: qty 5

## 2019-10-01 MED ORDER — LIDOCAINE HCL (PF) 1 % IJ SOLN
INTRAMUSCULAR | Status: DC | PRN
  Administered 2019-10-01: 15 mL

## 2019-10-01 MED ORDER — PHENYLEPHRINE HCL (PRESSORS) 10 MG/ML IV SOLN
INTRAVENOUS | Status: DC | PRN
  Administered 2019-10-01: 40 ug via INTRAVENOUS
  Administered 2019-10-01: 120 ug via INTRAVENOUS
  Administered 2019-10-01: 80 ug via INTRAVENOUS

## 2019-10-01 MED ORDER — FENTANYL CITRATE (PF) 250 MCG/5ML IJ SOLN
INTRAMUSCULAR | Status: DC | PRN
Start: 2019-10-01 — End: 2019-10-01
  Administered 2019-10-01 (×2): 25 ug via INTRAVENOUS

## 2019-10-01 MED ORDER — PHENYLEPHRINE 40 MCG/ML (10ML) SYRINGE FOR IV PUSH (FOR BLOOD PRESSURE SUPPORT)
PREFILLED_SYRINGE | INTRAVENOUS | Status: AC
  Filled 2019-10-01: qty 10

## 2019-10-01 MED ORDER — PROPOFOL 500 MG/50ML IV EMUL
INTRAVENOUS | Status: DC | PRN
  Administered 2019-10-01: 50 ug/kg/min via INTRAVENOUS

## 2019-10-01 MED ORDER — PROPOFOL 10 MG/ML IV BOLUS
INTRAVENOUS | Status: AC
  Filled 2019-10-01: qty 40

## 2019-10-01 MED ORDER — HEPARIN SODIUM (PORCINE) 1000 UNIT/ML IJ SOLN
INTRAMUSCULAR | Status: DC | PRN
  Administered 2019-10-01: 9000 [IU] via INTRAVENOUS

## 2019-10-01 MED ORDER — SODIUM CHLORIDE 0.9 % IV SOLN
INTRAVENOUS | Status: DC | PRN
  Administered 2019-10-01: 500 mL

## 2019-10-01 MED ORDER — 0.9 % SODIUM CHLORIDE (POUR BTL) OPTIME
TOPICAL | Status: DC | PRN
  Administered 2019-10-01: 1000 mL

## 2019-10-01 MED ORDER — CHLORHEXIDINE GLUCONATE 0.12 % MT SOLN
OROMUCOSAL | Status: AC
  Filled 2019-10-01: qty 15

## 2019-10-01 MED ORDER — ORAL CARE MOUTH RINSE: 15.0000 mL | Freq: Once | OROMUCOSAL | Status: AC

## 2019-10-01 MED ORDER — LIDOCAINE 2% (20 MG/ML) 5 ML SYRINGE
INTRAMUSCULAR | Status: AC
  Filled 2019-10-01: qty 5

## 2019-10-01 MED ORDER — SODIUM CHLORIDE 0.9 % IV SOLN: INTRAVENOUS | Status: DC

## 2019-10-01 MED ORDER — FENTANYL CITRATE (PF) 100 MCG/2ML IJ SOLN: 25.0000 ug | INTRAMUSCULAR | Status: DC | PRN

## 2019-10-01 MED ORDER — SODIUM CHLORIDE 0.9 % IV SOLN
INTRAVENOUS | Status: AC
  Filled 2019-10-01: qty 1.2

## 2019-10-01 MED ORDER — ONDANSETRON HCL 4 MG/2ML IJ SOLN
INTRAMUSCULAR | Status: DC | PRN
  Administered 2019-10-01: 4 mg via INTRAVENOUS

## 2019-10-01 MED ORDER — PROTAMINE SULFATE 10 MG/ML IV SOLN
INTRAVENOUS | Status: DC | PRN
  Administered 2019-10-01: 50 mg via INTRAVENOUS

## 2019-10-01 MED ORDER — LIDOCAINE-EPINEPHRINE (PF) 1 %-1:200000 IJ SOLN
INTRAMUSCULAR | Status: AC
  Filled 2019-10-01: qty 30

## 2019-10-01 MED ORDER — ONDANSETRON HCL 4 MG/2ML IJ SOLN
INTRAMUSCULAR | Status: AC
  Filled 2019-10-01: qty 2

## 2019-10-01 MED ORDER — LIDOCAINE HCL (CARDIAC) PF 100 MG/5ML IV SOSY
PREFILLED_SYRINGE | INTRAVENOUS | Status: DC | PRN
  Administered 2019-10-01: 50 mg via INTRATRACHEAL

## 2019-10-01 SURGICAL SUPPLY — 36 items
ARMBAND PINK RESTRICT EXTREMIT (MISCELLANEOUS) ×3 IMPLANT
CANISTER SUCT 3000ML PPV (MISCELLANEOUS) ×3 IMPLANT
CANNULA VESSEL 3MM 2 BLNT TIP (CANNULA) ×3 IMPLANT
CLIP VESOCCLUDE MED 24/CT (CLIP) ×3 IMPLANT
CLIP VESOCCLUDE SM WIDE 24/CT (CLIP) ×3 IMPLANT
COVER PROBE W GEL 5X96 (DRAPES) ×3 IMPLANT
COVER WAND RF STERILE (DRAPES) IMPLANT
DECANTER SPIKE VIAL GLASS SM (MISCELLANEOUS) IMPLANT
DERMABOND ADVANCED (GAUZE/BANDAGES/DRESSINGS) ×2
DERMABOND ADVANCED .7 DNX12 (GAUZE/BANDAGES/DRESSINGS) ×4 IMPLANT
ELECT REM PT RETURN 9FT ADLT (ELECTROSURGICAL) ×3
ELECTRODE REM PT RTRN 9FT ADLT (ELECTROSURGICAL) ×2 IMPLANT
GLOVE BIO SURGEON STRL SZ 6.5 (GLOVE) ×3 IMPLANT
GLOVE BIO SURGEON STRL SZ7.5 (GLOVE) ×9 IMPLANT
GLOVE BIOGEL PI IND STRL 6.5 (GLOVE) ×2 IMPLANT
GLOVE BIOGEL PI IND STRL 8 (GLOVE) ×2 IMPLANT
GLOVE BIOGEL PI INDICATOR 6.5 (GLOVE) ×1
GLOVE BIOGEL PI INDICATOR 8 (GLOVE) ×1
GOWN L4 XLG 20 PK N/S (GOWN DISPOSABLE) ×3 IMPLANT
GOWN STRL REUS W/ TWL LRG LVL3 (GOWN DISPOSABLE) ×6 IMPLANT
GOWN STRL REUS W/TWL LRG LVL3 (GOWN DISPOSABLE) ×9
KIT BASIN OR (CUSTOM PROCEDURE TRAY) ×3 IMPLANT
KIT TURNOVER KIT B (KITS) ×3 IMPLANT
NS IRRIG 1000ML POUR BTL (IV SOLUTION) ×3 IMPLANT
PACK CV ACCESS (CUSTOM PROCEDURE TRAY) ×3 IMPLANT
PAD ARMBOARD 7.5X6 YLW CONV (MISCELLANEOUS) ×6 IMPLANT
SPONGE SURGIFOAM ABS GEL 100 (HEMOSTASIS) IMPLANT
SUT PROLENE 6 0 BV (SUTURE) ×6 IMPLANT
SUT SILK 2 0 SH (SUTURE) IMPLANT
SUT VIC AB 3-0 SH 27 (SUTURE) ×6
SUT VIC AB 3-0 SH 27X BRD (SUTURE) ×4 IMPLANT
SUT VIC AB 4-0 PS2 18 (SUTURE) ×3 IMPLANT
SUT VICRYL 4-0 PS2 18IN ABS (SUTURE) ×3 IMPLANT
TOWEL GREEN STERILE (TOWEL DISPOSABLE) ×3 IMPLANT
UNDERPAD 30X36 HEAVY ABSORB (UNDERPADS AND DIAPERS) ×3 IMPLANT
WATER STERILE IRR 1000ML POUR (IV SOLUTION) ×3 IMPLANT

## 2019-10-01 NOTE — Interval H&P Note (Signed)
History and Physical Interval Note:  10/01/2019 6:59 AM  Thomas Mcguire  has presented today for surgery, with the diagnosis of CHRONIC KIDNEY DISEASE.  The various methods of treatment have been discussed with the patient and family. After consideration of risks, benefits and other options for treatment, the patient has consented to  Procedure(s): [place] (Right) as a surgical intervention.  The patient's history has been reviewed, patient examined, no change in status, stable for surgery.  I have reviewed the patient's chart and labs.  Questions were answered to the patient's satisfaction.     Deitra Mayo

## 2019-10-01 NOTE — Transfer of Care (Signed)
Immediate Anesthesia Transfer of Care Note  Patient: Thomas Mcguire  Procedure(s) Performed: FIRST STAGE BASCILIC VEIN TRANSPOSITION (Right Arm Upper) LIGATION OF RIGHT RADIOCEPHALIC  ARTERIOVENOUS  FISTULA (Right Wrist)  Patient Location: PACU  Anesthesia Type:MAC  Level of Consciousness: drowsy and patient cooperative  Airway & Oxygen Therapy: Patient Spontanous Breathing and Patient connected to face mask oxygen  Post-op Assessment: Report given to RN and Post -op Vital signs reviewed and stable  Post vital signs: Reviewed and stable  Last Vitals:  Vitals Value Taken Time  BP 146/68   Temp    Pulse 54 10/01/19 0855  Resp 18 10/01/19 0855  SpO2 100 % 10/01/19 0855  Vitals shown include unvalidated device data.  Last Pain: There were no vitals filed for this visit.       Complications: No complications documented.

## 2019-10-01 NOTE — Discharge Instructions (Signed)
° °  [facility] ° °Discharge Instructions ° °AV Fistula or Graft Surgery for Dialysis Access ° °Please refer to the following instructions for your post-procedure care. Your surgeon or physician assistant will discuss any changes with you. ° °Activity ° °You may drive the day following your surgery, if you are comfortable and no longer taking prescription pain medication. Resume full activity as the soreness in your incision resolves. ° °Bathing/Showering ° °You may shower after you go home. Keep your incision dry for 48 hours. Do not soak in a bathtub, hot tub, or swim until the incision heals completely. You may not shower if you have a hemodialysis catheter. ° °Incision Care ° °Clean your incision with mild soap and water after 48 hours. Pat the area dry with a clean towel. You do not need a bandage unless otherwise instructed. Do not apply any ointments or creams to your incision. You may have skin glue on your incision. Do not peel it off. It will come off on its own in about one week. Your arm may swell a bit after surgery. To reduce swelling use pillows to elevate your arm so it is above your heart. Your doctor will tell you if you need to lightly wrap your arm with an ACE bandage. ° °Diet ° °Resume your normal diet. There are not special food restrictions following this procedure. In order to heal from your surgery, it is CRITICAL to get adequate nutrition. Your body requires vitamins, minerals, and protein. Vegetables are the best source of vitamins and minerals. Vegetables also provide the perfect balance of protein. Processed food has little nutritional value, so try to avoid this. ° °Medications ° °Resume taking all of your medications. If your incision is causing pain, you may take over-the counter pain relievers such as acetaminophen (Tylenol). If you were prescribed a stronger pain medication, please be aware these medications can cause nausea and constipation. Prevent  nausea by taking the medication with a snack or meal. Avoid constipation by drinking plenty of fluids and eating foods with high amount of fiber, such as fruits, vegetables, and grains. Do not take Tylenol if you are taking prescription pain medications. ° ° ° ° °Follow up °Your surgeon may want to see you in the office following your access surgery. If so, this will be arranged at the time of your surgery. ° °Please call us immediately for any of the following conditions: ° °Increased pain, redness, drainage (pus) from your incision site °Fever of 101 degrees or higher °Severe or worsening pain at your incision site °Hand pain or numbness. ° °Reduce your risk of vascular disease: ° °Stop smoking. If you would like help, call [facility] at 1-800-QUIT-NOW (1-800-784-8669) or [facility] at 336-586-4000 ° °Manage your cholesterol °Maintain a desired weight °Control your diabetes °Keep your blood pressure down ° °Dialysis ° °It will take several weeks to several months for your new dialysis access to be ready for use. Your surgeon will determine when it is OK to use it. Your nephrologist will continue to direct your dialysis. You can continue to use your Permcath until your new access is ready for use. ° °If you have any questions, please call the office at [phone]. ° °

## 2019-10-01 NOTE — Op Note (Signed)
    NAME: Thomas Mcguire    MRN: [id number] DOB: 1940/03/26    DATE OF OPERATION: 10/01/2019  PREOP DIAGNOSIS:    Stage V chronic kidney disease  POSTOP DIAGNOSIS:    Same  PROCEDURE:    First stage basilic vein transposition right arm Ligation of right radiocephalic fistula  SURGEON: Judeth Cornfield. Scot Dock, MD  ASSIST: Arlee Muslim  ANESTHESIA: Local with sedation  EBL: Minimal  INDICATIONS:    Thomas Mcguire is a 79 y.o. male who presented with a nonfunctioning right radiocephalic fistula.  He underwent a fistulogram and the fistula was not salvageable.  I recommended ligation of this fistula and a first stage basilic vein transposition.  FINDINGS:   4 mm upper arm basilic vein  TECHNIQUE:   The patient was taken to the operating room and sedated by anesthesia.  The right upper extremity was prepped and draped in usual sterile fashion.  After the skin was anesthetized with 1% lidocaine, an incision was made over the basilic vein just above the antecubital level.  Here the vein was dissected free with branches divided between clips and 3-0 silk ties.  Through the same incision the brachial artery was dissected free beneath the fascia.  The patient was heparinized.  The vein was ligated distally and spatulated.  The artery was clamped proximally and distally and a longitudinal arteriotomy was made.  The vein was sewn into side to the artery using continuous 6-0 Prolene suture.  At the completion was a good thrill in the fistula with good radial signal with the Doppler.  Next after the skin was anesthetized a small incision was made over the proximal fistula at the wrist.  The vein here was dissected free and ligated with a 2-0 silk tie.  This incision was closed with a 4-0 Vicryl.  Hemostasis was obtained in the incision in the upper arm and this was closed with a deep layer of 3-0 Vicryl and the skin closed with 4-0 Vicryl.  Dermabond was applied.  The patient tolerated  procedure well was transferred to the recovery room in stable condition.  All needle and sponge counts were correct.  Given the complexity of the case a first assistant was necessary in order to expedient the procedure and safely perform the technical aspects of the operation.  Deitra Mayo, MD, FACS [facility]  DATE OF DICTATION:   10/01/2019

## 2019-10-01 NOTE — Progress Notes (Signed)
Elevated BP, 202/48. Dr. Nyoka Cowden aware. Blood pressure to be rechecked. No new orders received. Patient has no c/o dizziness, blurry vision, or headache.

## 2019-10-01 NOTE — Anesthesia Procedure Notes (Signed)
Procedure Name: MAC Date/Time: 10/01/2019 7:38 AM Performed by: Kathryne Hitch, CRNA Pre-anesthesia Checklist: Patient identified, Emergency Drugs available, Suction available and Patient being monitored Patient Re-evaluated:Patient Re-evaluated prior to induction Oxygen Delivery Method: Simple face mask Induction Type: IV induction Dental Injury: Teeth and Oropharynx as per pre-operative assessment

## 2019-10-01 NOTE — Anesthesia Postprocedure Evaluation (Signed)
Anesthesia Post Note  Patient: Thomas Mcguire  Procedure(s) Performed: FIRST STAGE BASCILIC VEIN TRANSPOSITION (Right Arm Upper) LIGATION OF RIGHT RADIOCEPHALIC  ARTERIOVENOUS  FISTULA (Right Wrist)     Patient location during evaluation: PACU Anesthesia Type: MAC Level of consciousness: awake Pain management: pain level controlled Vital Signs Assessment: post-procedure vital signs reviewed and stable Respiratory status: spontaneous breathing Cardiovascular status: stable Postop Assessment: no apparent nausea or vomiting Anesthetic complications: no   No complications documented.  Last Vitals:  Vitals:   10/01/19 0926 10/01/19 0941  BP: (!) 154/71 (!) 160/54  Pulse: (!) 55 61  Resp: 12 11  Temp:  36.4 C  SpO2: 98% 99%    Last Pain:  Vitals:   10/01/19 0941  PainSc: 0-No pain                 Camdyn Beske

## 2019-10-02 ENCOUNTER — Encounter (HOSPITAL_COMMUNITY): Payer: Self-pay | Admitting: Vascular Surgery

## 2019-10-16 ENCOUNTER — Other Ambulatory Visit: Payer: Self-pay | Admitting: *Deleted

## 2019-10-16 DIAGNOSIS — N186 End stage renal disease: Secondary | ICD-10-CM

## 2019-10-20 ENCOUNTER — Emergency Department (HOSPITAL_COMMUNITY)
Admission: EM | Admit: 2019-10-20 | Discharge: 2019-10-21 | Disposition: A | Discharge status: Home / Self Care | Payer: Medicare Other | Attending: Emergency Medicine | Admitting: Emergency Medicine

## 2019-10-20 ENCOUNTER — Other Ambulatory Visit: Payer: Self-pay

## 2019-10-20 ENCOUNTER — Emergency Department (HOSPITAL_COMMUNITY): Payer: Medicare Other

## 2019-10-20 ENCOUNTER — Encounter (HOSPITAL_COMMUNITY): Payer: Self-pay | Admitting: Emergency Medicine

## 2019-10-20 DIAGNOSIS — Z79899 Other long term (current) drug therapy: Secondary | ICD-10-CM | POA: Diagnosis not present

## 2019-10-20 DIAGNOSIS — J810 Acute pulmonary edema: Secondary | ICD-10-CM

## 2019-10-20 DIAGNOSIS — Z7982 Long term (current) use of aspirin: Secondary | ICD-10-CM | POA: Diagnosis not present

## 2019-10-20 DIAGNOSIS — Z87891 Personal history of nicotine dependence: Secondary | ICD-10-CM | POA: Diagnosis not present

## 2019-10-20 DIAGNOSIS — Z992 Dependence on renal dialysis: Secondary | ICD-10-CM | POA: Diagnosis not present

## 2019-10-20 DIAGNOSIS — R0602 Shortness of breath: Secondary | ICD-10-CM | POA: Insufficient documentation

## 2019-10-20 DIAGNOSIS — N186 End stage renal disease: Secondary | ICD-10-CM | POA: Diagnosis not present

## 2019-10-20 DIAGNOSIS — I120 Hypertensive chronic kidney disease with stage 5 chronic kidney disease or end stage renal disease: Secondary | ICD-10-CM | POA: Diagnosis not present

## 2019-10-20 DIAGNOSIS — E119 Type 2 diabetes mellitus without complications: Secondary | ICD-10-CM | POA: Diagnosis not present

## 2019-10-20 DIAGNOSIS — Z794 Long term (current) use of insulin: Secondary | ICD-10-CM | POA: Insufficient documentation

## 2019-10-20 DIAGNOSIS — R600 Localized edema: Secondary | ICD-10-CM

## 2019-10-20 DIAGNOSIS — R2243 Localized swelling, mass and lump, lower limb, bilateral: Secondary | ICD-10-CM | POA: Insufficient documentation

## 2019-10-20 HISTORY — DX: End stage renal disease: N18.6

## 2019-10-20 NOTE — ED Triage Notes (Signed)
Pt presents from home with increasing shortness of breath that started 10/16/19 and swelling in lower extremities. Pt is to start dialysis but no start date given. Fistula to right arm.

## 2019-10-20 NOTE — ED Provider Notes (Signed)
[facility] DEPT Provider Note   CSN: [id number] Arrival date & time: 10/20/19  2216     History Chief Complaint  Patient presents with  . Shortness of Breath  . Leg Swelling    Thomas Mcguire is a 79 y.o. male.  79 year old male with end-stage renal disease but not started dialysis yet.  Patient initially had been on some furosemide but then asked week they told him to stop taking it as per some other medications around.  He started having worsening lower extremity swelling on Wednesday called his nephrologist.  They told him to go ahead and start taking it again and double it up for couple days.  He did that Thursday and Friday but did not increase his urination at all.  He also started developing some dyspnea during that time worse with exertion.  Has not noticed any abdominal distention but does have some upper abdominal discomfort without radiation.  Still makes urine.   Shortness of Breath      Past Medical History:  Diagnosis Date  . Anemia    low iron  . Arthritis   . Chronic renal insufficiency    CHECKED Q3MOS PER PT. Stage 4 as of 05/22/2018 per pt.  . Diabetes mellitus    type 2  . ESRD (end stage renal disease) ([place])   . Gout    Has not had recently- 08/24/11  . Hyperlipidemia   . Hypertension   . 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 PAD (peripheral artery disease) ([place])   . Wears dentures     Patient Active Problem List   Diagnosis Date Noted  . CKD (chronic kidney disease) 10/06/2018  . PAD (peripheral artery disease) ([place]) 07/11/2018  . B12 deficiency 11/30/2017  . Folic acid deficiency 03/47/4259  . 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 A/V FISTULAGRAM N/A 08/11/2018   Procedure: A/V FISTULAGRAM - Right Arm;  Surgeon: Angelia Mould, MD;  Location: [facility];  Service: Cardiovascular;  Laterality:  N/A;  . A/V FISTULAGRAM N/A 09/07/2019   Procedure: A/V FISTULAGRAM - Right AVF;  Surgeon: Angelia Mould, MD;  Location: [facility];  Service: Cardiovascular;  Laterality: N/A;  . ABDOMINAL AORTOGRAM W/LOWER EXTREMITY Right 07/03/2018   Procedure: ABDOMINAL AORTOGRAM W/LOWER EXTREMITY;  Surgeon: Waynetta Sandy, MD;  Location: [facility];  Service: Cardiovascular;  Laterality: Right;  . AMPUTATION  08/27/2011   Procedure: AMPUTATION DIGIT;  Surgeon: Newt Minion, MD;  Location: [facility];  Service: Orthopedics;  Laterality: Left;  Left Foot 3rd toe Amputation MTP joint  . AMPUTATION Right 07/11/2018   Procedure: AMPUTATION RIGHT GREAT TOE;  Surgeon: Angelia Mould, MD;  Location: [facility];  Service: Vascular;  Laterality: Right;  . amputation great toe  2009   left  . AV FISTULA PLACEMENT Right 05/23/2018   Procedure: RADIAL- CEPHALIC ARTERIOVENOUS (AV) FISTULA CREATION RIGHT ARM AND LIGATION OF COMPETING BRANCH.;  Surgeon: Angelia Mould, MD;  Location: [facility];  Service: Vascular;  Laterality: Right;  . BASCILIC VEIN TRANSPOSITION Right 10/01/2019   Procedure: FIRST STAGE BASCILIC VEIN TRANSPOSITION;  Surgeon: Angelia Mould, MD;  Location: [facility];  Service: Vascular;  Laterality: Right;  . COLONOSCOPY    . cyst elbow  1999   right  . FEMORAL-POPLITEAL BYPASS GRAFT Right 07/11/2018   Procedure: BYPASS GRAFT FEMORAL-POPLITEAL ARTERY;  Surgeon: Angelia Mould, MD;  Location: [facility];  Service: Vascular;  Laterality: Right;  . FOOT SURGERY     both feet for something that was torn  . LIGATION OF ARTERIOVENOUS  FISTULA Right 10/01/2019   Procedure: LIGATION OF RIGHT RADIOCEPHALIC  ARTERIOVENOUS  FISTULA;  Surgeon: Angelia Mould, MD;  Location: [facility];  Service: Vascular;  Laterality: Right;  . TOE AMPUTATION  2010   left 2nd toe  . UPPER GI ENDOSCOPY         Family History  Problem Relation Age of Onset  . Colon cancer Neg Hx      Social History   Tobacco Use  . Smoking status: Former Smoker    Types: Cigarettes    Quit date: 03/11/1996    Years since quitting: 23.6  . Smokeless tobacco: Never Used  Vaping Use  . Vaping Use: Never used  Substance Use Topics  . Alcohol use: No  . Drug use: No    Home Medications Prior to Admission medications   Medication Sig Start Date End Date Taking? Authorizing Provider  allopurinol (ZYLOPRIM) 100 MG tablet Take 100 mg by mouth every Monday, Wednesday, and Friday.  09/29/18  Yes [provider]  amLODipine (NORVASC) 5 MG tablet Take 5 mg by mouth in the morning and at bedtime.    Yes [provider]  aspirin 81 MG tablet Take 81 mg by mouth daily.   Yes [provider]  calcitRIOL (ROCALTROL) 0.25 MCG capsule Take 0.25 mcg by mouth every Monday, Wednesday, and Friday.  10/27/17  Yes [provider]  carvedilol (COREG) 25 MG tablet Take 12.5 mg by mouth 2 (two) times daily with a meal.    Yes [provider]  COD LIVER OIL PO Take 1 capsule by mouth 2 (two) times daily.    Yes [provider]  cyanocobalamin (,VITAMIN B-12,) 1000 MCG/ML injection Vitamin B12 injection: 1000 mcg (1 mL) by subcu injection once daily for 7 days; then once weekly for 4 weeks; then once monthly thereafter.  7 07/15/2010 Patient taking differently: Inject 1,000 mcg into the muscle every 30 (thirty) days.  12/21/18  Yes Ladell Pier, MD  doxazosin (CARDURA) 2 MG tablet Take 2 mg by mouth at bedtime.    Yes [provider]  ferrous sulfate 325 (65 FE) MG tablet Take 325 mg by mouth 2 (two) times daily with a meal.    Yes [provider]  folic acid (FOLVITE) 1 MG tablet TAKE 1 TABLET BY MOUTH EVERY DAY Patient taking differently: Take 1 mg by mouth daily.  09/06/19  Yes Ladell Pier, MD  furosemide (LASIX) 80 MG tablet Take 1 tablet (80 mg total) by mouth 2 (two) times daily. 09/20/18  Yes Daleen Bo, MD  insulin NPH  (HUMULIN N,NOVOLIN N) 100 UNIT/ML injection Inject 50 Units into the skin 2 (two) times daily.    Yes [provider]  insulin regular (HUMULIN R,NOVOLIN R) 100 units/mL injection Inject 20-25 Units into the skin 3 (three) times daily before meals. Sliding Scale   Yes [provider]  isosorbide mononitrate (IMDUR) 30 MG 24 hr tablet Take 30 mg by mouth daily. 11/07/17  Yes [provider]  potassium chloride (K-DUR) 10 MEQ tablet Take 10 mEq by mouth daily. 11/02/17  Yes [provider]  pravastatin (PRAVACHOL) 40 MG tablet Take 40 mg by mouth at bedtime.    Yes [provider]  sevelamer carbonate (RENVELA) 800  MG tablet Take 800 mg by mouth 3 (three) times daily. 10/05/19  Yes [provider]    Allergies    Patient has no known allergies.  Review of Systems   Review of Systems  Respiratory: Positive for shortness of breath.   All other systems reviewed and are negative.   Physical Exam Updated Vital Signs BP (!) 146/52 (BP Location: Left Arm)   Pulse 62   Temp (!) 97.5 F (36.4 C) (Oral)   Resp 18   Ht 6\' 6"  (1.981 m)   Wt 96.2 kg   SpO2 92%   BMI 24.50 kg/m   Physical Exam Vitals and nursing note reviewed.  Constitutional:      Appearance: He is well-developed.  HENT:     Head: Normocephalic and atraumatic.  Neck:     Vascular: JVD present.  Cardiovascular:     Rate and Rhythm: Normal rate.  Pulmonary:     Effort: Pulmonary effort is normal. No respiratory distress.  Chest:     Chest wall: No mass or deformity.  Abdominal:     General: There is no distension.  Musculoskeletal:        General: Normal range of motion.     Cervical back: Normal range of motion.     Right lower leg: Edema present.     Left lower leg: Edema present.  Skin:    General: Skin is warm and dry.     Coloration: Skin is not cyanotic or pale.  Neurological:     General: No focal deficit present.     Mental Status: He is alert.      ED Results / Procedures / Treatments   Labs (all labs ordered are listed, but only abnormal results are displayed) Labs Reviewed  CBC WITH DIFFERENTIAL/PLATELET - Abnormal; Notable for the following components:      Result Value   WBC 16.7 (*)    RBC 3.49 (*)    Hemoglobin 9.4 (*)    HCT 29.5 (*)    RDW 15.9 (*)    Lymphs Abs 11.4 (*)    Monocytes Absolute 2.6 (*)    All other components within normal limits  BASIC METABOLIC PANEL - Abnormal; Notable for the following components:   Sodium 133 (*)    CO2 18 (*)    Glucose, Bld 116 (*)    BUN 79 (*)    Creatinine, Ser 6.53 (*)    Calcium 8.1 (*)    GFR, Estimated 7 (*)    All other components within normal limits  BRAIN NATRIURETIC PEPTIDE - Abnormal; Notable for the following components:   B Natriuretic Peptide 1,915.6 (*)    All other components within normal limits  TROPONIN I (HIGH SENSITIVITY) - Abnormal; Notable for the following components:   Troponin I (High Sensitivity) 347 (*)    All other components within normal limits  TROPONIN I (HIGH SENSITIVITY) - Abnormal; Notable for the following components:   Troponin I (High Sensitivity) 358 (*)    All other components within normal limits    EKG None  Radiology DG Chest 2 View  Result Date: 10/20/2019 CLINICAL DATA:  Shortness of breath, swelling in the lower extremities, awaiting dialysis EXAM: CHEST - 2 VIEW COMPARISON:  Radiograph 09/20/2018 FINDINGS: There are hazy interstitial opacities with a mid to lower lung predominance as well as central vascular congestion and cuffing with peripheral septal thickening and fissural thickening. No sizable pleural effusions. No pneumothorax. Cardiomediastinal contours are similar to prior  with a top-normal cardiac size and a calcified aorta. No acute osseous or soft tissue abnormality. Atherosclerotic calcification of the bilateral axilla. Telemetry leads overlie the chest. IMPRESSION: Findings suggestive of CHF/volume  overload with pulmonary edema and vascular congestion. Aortic Atherosclerosis (ICD10-I70.0). Electronically Signed   By: Lovena Le M.D.   On: 10/20/2019 23:14    Procedures Procedures (including critical care time)  Medications Ordered in ED Medications  furosemide (LASIX) 120 mg in dextrose 5 % 50 mL IVPB (0 mg Intravenous Stopped 10/21/19 0338)    ED Course  I have reviewed the triage vital signs and the nursing notes.  Pertinent labs & imaging results that were available during my care of the patient were reviewed by me and considered in my medical decision making (see chart for details).    MDM Rules/Calculators/A&P                         Patient appears to be volume overloaded.  We will check his labs to make sure there is no worsening in his renal function if this is okay will likely attempt diuresis to emergency room.  He is not hypoxic or particularly tachypneic at rest so at this time I do not see an indication for admission if we can get him urinating.  Patient was able to urinate quite a bit and with resolution of symptoms.  His troponins are elevated as are his BNP I think these are both secondary to acute pulmonary edema rather than acute coronary syndrome as he has no chest pain.  He also has chronic renal failure which makes clearance of his troponins difficult.  They are unchanged basically over 2 hours.  So I think it is okay to discharge home on increased Lasix and follow-up with Dr. Carolin Sicks.  Final Clinical Impression(s) / ED Diagnoses Final diagnoses:  Bilateral lower extremity edema  Acute pulmonary edema [facility])    Rx / DC Orders ED Discharge Orders    None       Cacie Gaskins, Corene Cornea, MD 10/21/19 [id number]

## 2019-10-21 LAB — BASIC METABOLIC PANEL
Anion gap: 14 (ref 5–15)
BUN: 79 mg/dL — ABNORMAL HIGH (ref 8–23)
CO2: 18 mmol/L — ABNORMAL LOW (ref 22–32)
Calcium: 8.1 mg/dL — ABNORMAL LOW (ref 8.9–10.3)
Chloride: 101 mmol/L (ref 98–111)
Creatinine, Ser: 6.53 mg/dL — ABNORMAL HIGH (ref 0.61–1.24)
GFR, Estimated: 7 mL/min — ABNORMAL LOW (ref >60)
Glucose, Bld: 116 mg/dL — ABNORMAL HIGH (ref 70–99)
Potassium: 4 mmol/L (ref 3.5–5.1)
Sodium: 133 mmol/L — ABNORMAL LOW (ref 135–145)

## 2019-10-21 LAB — CBC WITH DIFFERENTIAL/PLATELET
Abs Immature Granulocytes: 0.03 10*3/uL (ref 0.00–0.07)
Basophils Absolute: 0 10*3/uL (ref 0.0–0.1)
Basophils Relative: 0 %
Eosinophils Absolute: 0.1 10*3/uL (ref 0.0–0.5)
Eosinophils Relative: 1 %
HCT: 29.5 % — ABNORMAL LOW (ref 39.0–52.0)
Hemoglobin: 9.4 g/dL — ABNORMAL LOW (ref 13.0–17.0)
Immature Granulocytes: 0 %
Lymphocytes Relative: 69 %
Lymphs Abs: 11.4 10*3/uL — ABNORMAL HIGH (ref 0.7–4.0)
MCH: 26.9 pg (ref 26.0–34.0)
MCHC: 31.9 g/dL (ref 30.0–36.0)
MCV: 84.5 fL (ref 80.0–100.0)
Monocytes Absolute: 2.6 10*3/uL — ABNORMAL HIGH (ref 0.1–1.0)
Monocytes Relative: 15 %
Neutro Abs: 2.6 10*3/uL (ref 1.7–7.7)
Neutrophils Relative %: 15 %
Platelets: 176 10*3/uL (ref 150–400)
RBC: 3.49 MIL/uL — ABNORMAL LOW (ref 4.22–5.81)
RDW: 15.9 % — ABNORMAL HIGH (ref 11.5–15.5)
WBC: 16.7 10*3/uL — ABNORMAL HIGH (ref 4.0–10.5)
nRBC: 0 % (ref 0.0–0.2)

## 2019-10-21 LAB — TROPONIN I (HIGH SENSITIVITY)
Troponin I (High Sensitivity): 347 ng/L (ref <18)
Troponin I (High Sensitivity): 358 ng/L

## 2019-10-21 LAB — BRAIN NATRIURETIC PEPTIDE: B Natriuretic Peptide: 1915.6 pg/mL — ABNORMAL HIGH (ref 0.0–100.0)

## 2019-10-21 MED ORDER — FUROSEMIDE 10 MG/ML IJ SOLN
120.0000 mg | Freq: Once | INTRAVENOUS | Status: AC
  Administered 2019-10-21: 120 mg via INTRAVENOUS
  Filled 2019-10-21: qty 10

## 2019-10-21 NOTE — ED Notes (Signed)
Date and time results received: 10/21/19 1:36 AM  (use smartphrase ".now" to insert current time)  Test: Troponin Critical Value: 325  Name of Provider Notified: Dr.Mesner  Orders Received? Or Actions Taken?:

## 2019-10-21 NOTE — Discharge Instructions (Addendum)
Take 80mg  Lasix twice a day and call your nephrologist for further instructions.

## 2019-11-07 ENCOUNTER — Other Ambulatory Visit: Payer: Self-pay

## 2019-11-07 ENCOUNTER — Ambulatory Visit (INDEPENDENT_AMBULATORY_CARE_PROVIDER_SITE_OTHER): Payer: Self-pay | Admitting: Physician Assistant

## 2019-11-07 ENCOUNTER — Ambulatory Visit (INDEPENDENT_AMBULATORY_CARE_PROVIDER_SITE_OTHER)
Admission: RE | Admit: 2019-11-07 | Discharge: 2019-11-07 | Disposition: A | Discharge status: Home / Self Care | Payer: Medicare Other | Source: Ambulatory Visit | Attending: Physician Assistant | Admitting: Physician Assistant

## 2019-11-07 ENCOUNTER — Emergency Department (HOSPITAL_COMMUNITY): Payer: Medicare Other

## 2019-11-07 ENCOUNTER — Encounter (HOSPITAL_COMMUNITY): Payer: Self-pay

## 2019-11-07 ENCOUNTER — Inpatient Hospital Stay (HOSPITAL_COMMUNITY)
Admission: EM | Admit: 2019-11-07 | Discharge: 2019-11-14 | DRG: 981 | Disposition: A | Discharge status: Home / Self Care | Payer: Medicare Other | Source: Ambulatory Visit | Attending: Internal Medicine | Admitting: Internal Medicine

## 2019-11-07 VITALS — BP 116/48 | HR 54 | Temp 98.1°F | Resp 20 | Ht 78.0 in | Wt 212.0 lb

## 2019-11-07 DIAGNOSIS — E538 Deficiency of other specified B group vitamins: Secondary | ICD-10-CM | POA: Diagnosis present

## 2019-11-07 DIAGNOSIS — D696 Thrombocytopenia, unspecified: Secondary | ICD-10-CM | POA: Diagnosis present

## 2019-11-07 DIAGNOSIS — Z20822 Contact with and (suspected) exposure to covid-19: Secondary | ICD-10-CM | POA: Diagnosis present

## 2019-11-07 DIAGNOSIS — Z7189 Other specified counseling: Secondary | ICD-10-CM | POA: Diagnosis not present

## 2019-11-07 DIAGNOSIS — N185 Chronic kidney disease, stage 5: Secondary | ICD-10-CM | POA: Diagnosis not present

## 2019-11-07 DIAGNOSIS — R7989 Other specified abnormal findings of blood chemistry: Secondary | ICD-10-CM | POA: Diagnosis not present

## 2019-11-07 DIAGNOSIS — I5043 Acute on chronic combined systolic (congestive) and diastolic (congestive) heart failure: Secondary | ICD-10-CM | POA: Diagnosis present

## 2019-11-07 DIAGNOSIS — Z992 Dependence on renal dialysis: Secondary | ICD-10-CM | POA: Insufficient documentation

## 2019-11-07 DIAGNOSIS — I351 Nonrheumatic aortic (valve) insufficiency: Secondary | ICD-10-CM | POA: Diagnosis not present

## 2019-11-07 DIAGNOSIS — E8889 Other specified metabolic disorders: Secondary | ICD-10-CM | POA: Diagnosis present

## 2019-11-07 DIAGNOSIS — C9111 Chronic lymphocytic leukemia of B-cell type in remission: Secondary | ICD-10-CM

## 2019-11-07 DIAGNOSIS — D631 Anemia in chronic kidney disease: Secondary | ICD-10-CM | POA: Diagnosis present

## 2019-11-07 DIAGNOSIS — D649 Anemia, unspecified: Secondary | ICD-10-CM

## 2019-11-07 DIAGNOSIS — E785 Hyperlipidemia, unspecified: Secondary | ICD-10-CM | POA: Diagnosis present

## 2019-11-07 DIAGNOSIS — Z79899 Other long term (current) drug therapy: Secondary | ICD-10-CM

## 2019-11-07 DIAGNOSIS — I517 Cardiomegaly: Secondary | ICD-10-CM | POA: Diagnosis present

## 2019-11-07 DIAGNOSIS — E1141 Type 2 diabetes mellitus with diabetic mononeuropathy: Secondary | ICD-10-CM | POA: Diagnosis present

## 2019-11-07 DIAGNOSIS — M109 Gout, unspecified: Secondary | ICD-10-CM | POA: Diagnosis present

## 2019-11-07 DIAGNOSIS — Z95820 Peripheral vascular angioplasty status with implants and grafts: Secondary | ICD-10-CM

## 2019-11-07 DIAGNOSIS — C9110 Chronic lymphocytic leukemia of B-cell type not having achieved remission: Secondary | ICD-10-CM | POA: Diagnosis present

## 2019-11-07 DIAGNOSIS — R600 Localized edema: Secondary | ICD-10-CM

## 2019-11-07 DIAGNOSIS — I132 Hypertensive heart and chronic kidney disease with heart failure and with stage 5 chronic kidney disease, or end stage renal disease: Secondary | ICD-10-CM | POA: Diagnosis present

## 2019-11-07 DIAGNOSIS — Z7982 Long term (current) use of aspirin: Secondary | ICD-10-CM | POA: Diagnosis not present

## 2019-11-07 DIAGNOSIS — E1122 Type 2 diabetes mellitus with diabetic chronic kidney disease: Secondary | ICD-10-CM

## 2019-11-07 DIAGNOSIS — N186 End stage renal disease: Secondary | ICD-10-CM

## 2019-11-07 DIAGNOSIS — N189 Chronic kidney disease, unspecified: Secondary | ICD-10-CM | POA: Diagnosis not present

## 2019-11-07 DIAGNOSIS — N179 Acute kidney failure, unspecified: Secondary | ICD-10-CM | POA: Diagnosis not present

## 2019-11-07 DIAGNOSIS — D72820 Lymphocytosis (symptomatic): Secondary | ICD-10-CM | POA: Diagnosis not present

## 2019-11-07 DIAGNOSIS — E1151 Type 2 diabetes mellitus with diabetic peripheral angiopathy without gangrene: Secondary | ICD-10-CM | POA: Diagnosis present

## 2019-11-07 DIAGNOSIS — I340 Nonrheumatic mitral (valve) insufficiency: Secondary | ICD-10-CM | POA: Diagnosis not present

## 2019-11-07 DIAGNOSIS — Z89411 Acquired absence of right great toe: Secondary | ICD-10-CM | POA: Diagnosis not present

## 2019-11-07 DIAGNOSIS — I361 Nonrheumatic tricuspid (valve) insufficiency: Secondary | ICD-10-CM | POA: Diagnosis not present

## 2019-11-07 DIAGNOSIS — N19 Unspecified kidney failure: Secondary | ICD-10-CM | POA: Diagnosis not present

## 2019-11-07 DIAGNOSIS — I70201 Unspecified atherosclerosis of native arteries of extremities, right leg: Secondary | ICD-10-CM | POA: Diagnosis present

## 2019-11-07 DIAGNOSIS — E8770 Fluid overload, unspecified: Secondary | ICD-10-CM | POA: Diagnosis present

## 2019-11-07 DIAGNOSIS — Z87891 Personal history of nicotine dependence: Secondary | ICD-10-CM

## 2019-11-07 DIAGNOSIS — E8779 Other fluid overload: Principal | ICD-10-CM

## 2019-11-07 DIAGNOSIS — E1165 Type 2 diabetes mellitus with hyperglycemia: Secondary | ICD-10-CM | POA: Diagnosis present

## 2019-11-07 DIAGNOSIS — Z89422 Acquired absence of other left toe(s): Secondary | ICD-10-CM

## 2019-11-07 DIAGNOSIS — E872 Acidosis: Secondary | ICD-10-CM | POA: Diagnosis present

## 2019-11-07 DIAGNOSIS — E119 Type 2 diabetes mellitus without complications: Secondary | ICD-10-CM

## 2019-11-07 DIAGNOSIS — Z794 Long term (current) use of insulin: Secondary | ICD-10-CM

## 2019-11-07 LAB — CBC WITH DIFFERENTIAL/PLATELET
Abs Immature Granulocytes: 0 10*3/uL (ref 0.00–0.07)
Basophils Absolute: 0.3 10*3/uL — ABNORMAL HIGH (ref 0.0–0.1)
Basophils Relative: 2 %
Eosinophils Absolute: 0 10*3/uL (ref 0.0–0.5)
Eosinophils Relative: 0 %
HCT: 28.3 % — ABNORMAL LOW (ref 39.0–52.0)
Hemoglobin: 8.8 g/dL — ABNORMAL LOW (ref 13.0–17.0)
Lymphocytes Relative: 61 %
Lymphs Abs: 10.2 10*3/uL — ABNORMAL HIGH (ref 0.7–4.0)
MCH: 27 pg (ref 26.0–34.0)
MCHC: 31.1 g/dL (ref 30.0–36.0)
MCV: 86.8 fL (ref 80.0–100.0)
Monocytes Absolute: 0.2 10*3/uL (ref 0.1–1.0)
Monocytes Relative: 1 %
Neutro Abs: 6 10*3/uL (ref 1.7–7.7)
Neutrophils Relative %: 36 %
Platelets: 142 10*3/uL — ABNORMAL LOW (ref 150–400)
RBC: 3.26 MIL/uL — ABNORMAL LOW (ref 4.22–5.81)
RDW: 17.2 % — ABNORMAL HIGH (ref 11.5–15.5)
WBC: 16.7 10*3/uL — ABNORMAL HIGH (ref 4.0–10.5)
nRBC: 0 % (ref 0.0–0.2)
nRBC: 0 /100 WBC

## 2019-11-07 LAB — COMPREHENSIVE METABOLIC PANEL
ALT: 36 U/L (ref 0–44)
AST: 27 U/L (ref 15–41)
Albumin: 3 g/dL — ABNORMAL LOW (ref 3.5–5.0)
Alkaline Phosphatase: 64 U/L (ref 38–126)
Anion gap: 17 — ABNORMAL HIGH (ref 5–15)
BUN: 156 mg/dL — ABNORMAL HIGH (ref 8–23)
CO2: 17 mmol/L — ABNORMAL LOW (ref 22–32)
Calcium: 7.6 mg/dL — ABNORMAL LOW (ref 8.9–10.3)
Chloride: 98 mmol/L (ref 98–111)
Creatinine, Ser: 8.35 mg/dL — ABNORMAL HIGH (ref 0.61–1.24)
GFR, Estimated: 6 mL/min — ABNORMAL LOW (ref >60)
Glucose, Bld: 151 mg/dL — ABNORMAL HIGH (ref 70–99)
Potassium: 4.5 mmol/L (ref 3.5–5.1)
Sodium: 132 mmol/L — ABNORMAL LOW (ref 135–145)
Total Bilirubin: 1.3 mg/dL — ABNORMAL HIGH (ref 0.3–1.2)
Total Protein: 5.9 g/dL — ABNORMAL LOW (ref 6.5–8.1)

## 2019-11-07 LAB — RESPIRATORY PANEL BY RT PCR (FLU A&B, COVID)
Influenza A by PCR: NEGATIVE
Influenza B by PCR: NEGATIVE
SARS Coronavirus 2 by RT PCR: NEGATIVE

## 2019-11-07 LAB — CBG MONITORING, ED: Glucose-Capillary: 166 mg/dL — ABNORMAL HIGH (ref 70–99)

## 2019-11-07 MED ORDER — INSULIN ASPART 100 UNIT/ML ~~LOC~~ SOLN
0.0000 [IU] | SUBCUTANEOUS | Status: DC
  Administered 2019-11-07: 1 [IU] via SUBCUTANEOUS
  Administered 2019-11-08: 2 [IU] via SUBCUTANEOUS
  Administered 2019-11-09 (×2): 1 [IU] via SUBCUTANEOUS
  Administered 2019-11-09: 3 [IU] via SUBCUTANEOUS
  Administered 2019-11-10 – 2019-11-11 (×3): 1 [IU] via SUBCUTANEOUS

## 2019-11-07 MED ORDER — FUROSEMIDE 10 MG/ML IJ SOLN
80.0000 mg | Freq: Once | INTRAMUSCULAR | Status: AC
  Administered 2019-11-07: 80 mg via INTRAVENOUS
  Filled 2019-11-07: qty 8

## 2019-11-07 NOTE — ED Provider Notes (Signed)
[facility] EMERGENCY DEPARTMENT Provider Note   CSN: [id number] Arrival date & time: 11/07/19  1813     History Chief Complaint  Patient presents with  . Leg Swelling    Thomas Mcguire is a 79 y.o. male.  HPI   Patient presents to the ED for evaluation of increasing leg swelling, fluid overload and progressive dyspnea with exertion.  Patient has a history of chronic kidney disease.  He has been follow-up by Dr. Carolin Sicks, nephrology.  Patient has been referred to vascular surgery in anticipation of eventual dialysis.  Patient was seen in the emergency room earlier this month for worsening edema.  Patient symptoms have progressed.  Seen in the office today by Lynn Ito with vascular vein specialist.  Case was discussed with Dr. Carolin Sicks.  Due to the patient's worsening symptoms they did not feel that he could wait for outpatient arrangement of dialysis.  Patient was instructed to come to the hospital to have a vascular catheter placed so that he could begin dialysis.  Patient denies any trouble with chest pain.  He is not having any fevers.  Right now he feels well.  He states as long as he is resting he feels okay  Past Medical History:  Diagnosis Date  . Anemia    low iron  . Arthritis   . Chronic renal insufficiency    CHECKED Q3MOS PER PT. Stage 4 as of 05/22/2018 per pt.  . Diabetes mellitus    type 2  . ESRD (end stage renal disease) ([place])   . Gout    Has not had recently- 08/24/11  . Hyperlipidemia   . Hypertension   . 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 PAD (peripheral artery disease) ([place])   . Wears dentures     Patient Active Problem List   Diagnosis Date Noted  . CKD (chronic kidney disease) 10/06/2018  . PAD (peripheral artery disease) ([place]) 07/11/2018  . B12 deficiency 11/30/2017  . Folic acid deficiency 61/44/3154  . 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 A/V FISTULAGRAM N/A 08/11/2018   Procedure: A/V FISTULAGRAM - Right Arm;  Surgeon: Angelia Mould, MD;  Location: [facility];  Service: Cardiovascular;  Laterality: N/A;  . A/V FISTULAGRAM N/A 09/07/2019   Procedure: A/V FISTULAGRAM - Right AVF;  Surgeon: Angelia Mould, MD;  Location: [facility];  Service: Cardiovascular;  Laterality: N/A;  . ABDOMINAL AORTOGRAM W/LOWER EXTREMITY Right 07/03/2018   Procedure: ABDOMINAL AORTOGRAM W/LOWER EXTREMITY;  Surgeon: Waynetta Sandy, MD;  Location: [facility];  Service: Cardiovascular;  Laterality: Right;  . AMPUTATION  08/27/2011   Procedure: AMPUTATION DIGIT;  Surgeon: Newt Minion, MD;  Location: [facility];  Service: Orthopedics;  Laterality: Left;  Left Foot 3rd toe Amputation MTP joint  . AMPUTATION Right 07/11/2018   Procedure: AMPUTATION RIGHT GREAT TOE;  Surgeon: Angelia Mould, MD;  Location: [facility];  Service: Vascular;  Laterality: Right;  . amputation great toe  2009   left  . AV FISTULA PLACEMENT Right 05/23/2018   Procedure: RADIAL- CEPHALIC ARTERIOVENOUS (AV) FISTULA CREATION RIGHT ARM AND LIGATION OF COMPETING BRANCH.;  Surgeon: Angelia Mould, MD;  Location: [facility];  Service: Vascular;  Laterality: Right;  . BASCILIC VEIN TRANSPOSITION Right 10/01/2019   Procedure: FIRST STAGE [place];  Surgeon: Angelia Mould, MD;  [facility];  Service: Vascular;  Laterality: Right;  . COLONOSCOPY    . cyst elbow  1999   right  . FEMORAL-POPLITEAL BYPASS GRAFT Right 07/11/2018   Procedure: BYPASS GRAFT FEMORAL-POPLITEAL ARTERY;  Surgeon: Angelia Mould, MD;  Location: [facility];  Service: Vascular;  Laterality: Right;  . FOOT SURGERY     both feet for something that was torn  . LIGATION OF ARTERIOVENOUS  FISTULA Right 10/01/2019   Procedure: LIGATION OF RIGHT RADIOCEPHALIC  ARTERIOVENOUS  FISTULA;  Surgeon: Angelia Mould,  MD;  Location: [facility];  Service: Vascular;  Laterality: Right;  . TOE AMPUTATION  2010   left 2nd toe  . UPPER GI ENDOSCOPY         Family History  Problem Relation Age of Onset  . Colon cancer Neg Hx     Social History   Tobacco Use  . Smoking status: Former Smoker    Types: Cigarettes    Quit date: 03/11/1996    Years since quitting: 23.6  . Smokeless tobacco: Never Used  Vaping Use  . Vaping Use: Never used  Substance Use Topics  . Alcohol use: No  . Drug use: No    Home Medications Prior to Admission medications   Medication Sig Start Date End Date Taking? Authorizing Provider  allopurinol (ZYLOPRIM) 100 MG tablet Take 100 mg by mouth every Monday, Wednesday, and Friday.  09/29/18   [provider]  amLODipine (NORVASC) 5 MG tablet Take 5 mg by mouth in the morning and at bedtime.     [provider]  aspirin 81 MG tablet Take 81 mg by mouth daily.    [provider]  calcitRIOL (ROCALTROL) 0.25 MCG capsule Take 0.25 mcg by mouth every Monday, Wednesday, and Friday.  10/27/17   [provider]  carvedilol (COREG) 25 MG tablet Take 12.5 mg by mouth 2 (two) times daily with a meal.     [provider]  COD LIVER OIL PO Take 1 capsule by mouth 2 (two) times daily.     [provider]  cyanocobalamin (,VITAMIN B-12,) 1000 MCG/ML injection Vitamin B12 injection: 1000 mcg (1 mL) by subcu injection once daily for 7 days; then once weekly for 4 weeks; then once monthly thereafter.  7 07/15/2010 Patient taking differently: Inject 1,000 mcg into the muscle every 30 (thirty) days.  12/21/18   Ladell Pier, MD  doxazosin (CARDURA) 2 MG tablet Take 2 mg by mouth at bedtime.     [provider]  ferrous sulfate 325 (65 FE) MG tablet Take 325 mg by mouth 2 (two) times daily with a meal.     [provider]  folic acid (FOLVITE) 1 MG tablet TAKE 1 TABLET BY MOUTH EVERY DAY Patient taking differently: Take 1 mg by  mouth daily.  09/06/19   Ladell Pier, MD  furosemide (LASIX) 80 MG tablet Take 1 tablet (80 mg total) by mouth 2 (two) times daily. 09/20/18   Daleen Bo, MD  insulin NPH (HUMULIN N,NOVOLIN N) 100 UNIT/ML injection Inject 50 Units into the skin 2 (two) times daily.     [provider]  insulin regular (HUMULIN R,NOVOLIN R) 100 units/mL injection Inject 20-25 Units into the skin 3 (three) times daily before meals. Sliding Scale    [provider]  isosorbide mononitrate (IMDUR) 30 MG 24 hr tablet Take 30 mg by mouth daily. 11/07/17   [provider]  potassium chloride (K-DUR) 10 MEQ tablet  Take 10 mEq by mouth daily. 11/02/17   [provider]  pravastatin (PRAVACHOL) 40 MG tablet Take 40 mg by mouth at bedtime.     [provider]  sevelamer carbonate (RENVELA) 800 MG tablet Take 800 mg by mouth 3 (three) times daily. 10/05/19   [provider]    Allergies    Patient has no known allergies.  Review of Systems   Review of Systems  All other systems reviewed and are negative.   Physical Exam Updated Vital Signs BP (!) 134/49 (BP Location: Left Arm)   Pulse (!) 58   Temp 98.2 F (36.8 C) (Oral)   Resp 18   SpO2 99%   Physical Exam Vitals and nursing note reviewed.  Constitutional:      General: He is not in acute distress.    Appearance: He is well-developed.  HENT:     Head: Normocephalic and atraumatic.     Right Ear: External ear normal.     Left Ear: External ear normal.  Eyes:     General: No scleral icterus.       Right eye: No discharge.        Left eye: No discharge.     Conjunctiva/sclera: Conjunctivae normal.  Neck:     Trachea: No tracheal deviation.  Cardiovascular:     Rate and Rhythm: Normal rate and regular rhythm.  Pulmonary:     Effort: Pulmonary effort is normal. No respiratory distress.     Breath sounds: Normal breath sounds. No stridor. No wheezing or rales.  Abdominal:     General: Bowel  sounds are normal. There is no distension.     Palpations: Abdomen is soft.     Tenderness: There is no abdominal tenderness. There is no guarding or rebound.  Musculoskeletal:        General: No tenderness.     Cervical back: Neck supple.     Right lower leg: Edema present.     Left lower leg: Edema present.     Comments: 3+ pitting edema bilaterally  Skin:    General: Skin is warm and dry.     Findings: No rash.  Neurological:     Mental Status: He is alert.     Cranial Nerves: No cranial nerve deficit (no facial droop, extraocular movements intact, no slurred speech).     Sensory: No sensory deficit.     Motor: No abnormal muscle tone or seizure activity.     Coordination: Coordination normal.     ED Results / Procedures / Treatments   Labs (all labs ordered are listed, but only abnormal results are displayed) Labs Reviewed  CBC WITH DIFFERENTIAL/PLATELET - Abnormal; Notable for the following components:      Result Value   WBC 16.7 (*)    RBC 3.26 (*)    Hemoglobin 8.8 (*)    HCT 28.3 (*)    RDW 17.2 (*)    Platelets 142 (*)    Lymphs Abs 10.2 (*)    Basophils Absolute 0.3 (*)    All other components within normal limits  COMPREHENSIVE METABOLIC PANEL - Abnormal; Notable for the following components:   Sodium 132 (*)    CO2 17 (*)    Glucose, Bld 151 (*)    BUN 156 (*)    Creatinine, Ser 8.35 (*)    Calcium 7.6 (*)    Total Protein 5.9 (*)    Albumin 3.0 (*)    Total Bilirubin 1.3 (*)  GFR, Estimated 6 (*)    Anion gap 17 (*)    All other components within normal limits  RESPIRATORY PANEL BY RT PCR (FLU A&B, COVID)  PATHOLOGIST SMEAR REVIEW    EKG None  Radiology DG Chest Portable 1 View  Result Date: 11/07/2019 CLINICAL DATA:  79 year old male with renal failure. EXAM: PORTABLE CHEST 1 VIEW COMPARISON:  Chest radiograph dated 10/20/2019 FINDINGS: Cardiomegaly with vascular congestion and mild edema. No focal consolidation, pleural effusion,  pneumothorax. Atherosclerotic calcification of the aorta. No acute osseous pathology. IMPRESSION: Cardiomegaly with mild CHF or fluid overload. Electronically Signed   By: Anner Crete M.D.   On: 11/07/2019 21:35   VAS US DUPLEX DIALYSIS ACCESS (AVF, AVG)  Result Date: 11/07/2019 DIALYSIS ACCESS Reason for Exam: Routine follow up. Access Site: Right Upper Extremity. Access Type: Basilic vein transposition 10/01/2019 with ligation of right              radiocephalic AVF. Performing Technologist: Ronal Fear RVS, RCS  Examination Guidelines: A complete evaluation includes B-mode imaging, spectral Doppler, color Doppler, and power Doppler as needed of all accessible portions of each vessel. Unilateral testing is considered an integral part of a complete examination. Limited examinations for reoccurring indications may be performed as noted.  Findings: +--------------------+----------+-----------------+--------+ AVF                 PSV (cm/s)Flow Vol (mL/min)Comments +--------------------+----------+-----------------+--------+ Native artery inflow   256           902                +--------------------+----------+-----------------+--------+ AVF Anastomosis        647                              +--------------------+----------+-----------------+--------+  +------------+----------+-------------+----------+--------+ OUTFLOW VEINPSV (cm/s)Diameter (cm)Depth (cm)Describe +------------+----------+-------------+----------+--------+ Prox UA        157        0.66        0.80            +------------+----------+-------------+----------+--------+ Mid UA         215        0.70        1.13            +------------+----------+-------------+----------+--------+ Dist UA        310        0.60        1.23            +------------+----------+-------------+----------+--------+ AC Fossa       665        0.52        0.95             +------------+----------+-------------+----------+--------+   Summary: 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 *See table(s) above for measurements and observations.  Diagnosing physician: Deitra Mayo MD Electronically signed by Deitra Mayo MD on 11/07/2019 at 2:53:42 PM.   --------------------------------------------------------------------------------   Final     Procedures Procedures (including critical care time)  Medications Ordered in ED Medications  furosemide (LASIX) injection 80 mg (has no administration in time range)    ED Course  I have reviewed the triage vital signs and the nursing notes.  Pertinent labs & imaging results that were available during my care of the patient were reviewed by me and considered in my medical decision making (see chart for details).  Clinical Course as of Nov 07 2147  Wed Nov 07, 2019  2126 Discussed with Dr Joylene Grapes.  Will consult on patient.   [JK]    Clinical Course User Index [JK] Dorie Rank, MD   MDM Rules/Calculators/A&P                          Patient presented to the ED for evaluation of worsening symptoms associated with chronic renal failure.Patient's labs do show significant elevation in his BUN and creatinine.  Patient's uremia has been progressively getting worse compared to previous labs.  Patient also has progressive anemia associated with his chronic kidney disease.  Patient will need vascular access.  Vascular recommended a tunneled catheter device.  IR will need to be consulted.  I discussed the case with Dr. Osborne Casco who will consult from nephrology.  I will consult the medical service for admission. Final Clinical Impression(s) / ED Diagnoses Final diagnoses:  Uremia  Stage 5 chronic kidney disease not on chronic dialysis ([place])  Anemia, unspecified type      Dorie Rank, MD 11/07/19 2149

## 2019-11-07 NOTE — ED Triage Notes (Signed)
Pt sent here by PCP for further evaluation of BLE swelling and fluid overload. Pt brought a note with instructions for STAT nephro consult for dialysis

## 2019-11-07 NOTE — Progress Notes (Addendum)
Established Dialysis Access   History of Present Illness   Thomas Mcguire is a 79 y.o. (01/07/1941) male who presents for re-evaluation of permanent access.  Surgical history significant for right radiocephalic fistula creation by Dr. Scot Dock 05/2018.  This fistula never fully matured and eventually was deemed unsalvageable with fistulogram.  He then underwent for stage basilic vein fistula by Dr. Scot Dock on 10/01/2019.  He denies any signs or symptoms of steal syndrome.  He is not yet on hemodialysis.  However patient presented to the emergency department on 10/20/2019 with bilateral lower extremity edema.  He was diuresed and followed-up with his nephrologist Dr. Carolin Sicks.  During our visit today he mentions bilateral lower extremity edema and weakness.  He and his wife are willing to proceed with second stage basilic vein transposition.    The patient's PMH, PSH, SH, and FamHx were reviewed and are unchanged from prior visit.  Current Outpatient Medications  Medication Sig Dispense Refill  . allopurinol (ZYLOPRIM) 100 MG tablet Take 100 mg by mouth every Monday, Wednesday, and Friday.     Marland Kitchen amLODipine (NORVASC) 5 MG tablet Take 5 mg by mouth in the morning and at bedtime.     Marland Kitchen aspirin 81 MG tablet Take 81 mg by mouth daily.    . calcitRIOL (ROCALTROL) 0.25 MCG capsule Take 0.25 mcg by mouth every Monday, Wednesday, and Friday.   5  . carvedilol (COREG) 25 MG tablet Take 12.5 mg by mouth 2 (two) times daily with a meal.     . COD LIVER OIL PO Take 1 capsule by mouth 2 (two) times daily.     . cyanocobalamin (,VITAMIN B-12,) 1000 MCG/ML injection Vitamin B12 injection: 1000 mcg (1 mL) by subcu injection once daily for 7 days; then once weekly for 4 weeks; then once monthly thereafter.  7 07/15/2010 (Patient taking differently: Inject 1,000 mcg into the muscle every 30 (thirty) days. ) 12 mL 1  . doxazosin (CARDURA) 2 MG tablet Take 2 mg by mouth at bedtime.     . ferrous sulfate 325 (65 FE)  MG tablet Take 325 mg by mouth 2 (two) times daily with a meal.     . folic acid (FOLVITE) 1 MG tablet TAKE 1 TABLET BY MOUTH EVERY DAY (Patient taking differently: Take 1 mg by mouth daily. ) 90 tablet 1  . furosemide (LASIX) 80 MG tablet Take 1 tablet (80 mg total) by mouth 2 (two) times daily. 60 tablet 0  . insulin NPH (HUMULIN N,NOVOLIN N) 100 UNIT/ML injection Inject 50 Units into the skin 2 (two) times daily.     . insulin regular (HUMULIN R,NOVOLIN R) 100 units/mL injection Inject 20-25 Units into the skin 3 (three) times daily before meals. Sliding Scale    . isosorbide mononitrate (IMDUR) 30 MG 24 hr tablet Take 30 mg by mouth daily.  6  . potassium chloride (K-DUR) 10 MEQ tablet Take 10 mEq by mouth daily.  3  . pravastatin (PRAVACHOL) 40 MG tablet Take 40 mg by mouth at bedtime.     . sevelamer carbonate (RENVELA) 800 MG tablet Take 800 mg by mouth 3 (three) times daily.     No current facility-administered medications for this visit.    REVIEW OF SYSTEMS (negative unless checked):   Cardiac:  []  Chest pain or chest pressure? []  Shortness of breath upon activity? []  Shortness of breath when lying flat? []  Irregular heart rhythm?  Vascular:  []  Pain in calf, thigh, or  hip brought on by walking? []  Pain in feet at night that wakes you up from your sleep? []  Blood clot in your veins? []  Leg swelling?  Pulmonary:  []  Oxygen at home? []  Productive cough? []  Wheezing?  Neurologic:  []  Sudden weakness in arms or legs? []  Sudden numbness in arms or legs? []  Sudden onset of difficult speaking or slurred speech? []  Temporary loss of vision in one eye? []  Problems with dizziness?  Gastrointestinal:  []  Blood in stool? []  Vomited blood?  Genitourinary:  []  Burning when urinating? []  Blood in urine?  Psychiatric:  []  Major depression  Hematologic:  []  Bleeding problems? []  Problems with blood clotting?  Dermatologic:  []  Rashes or ulcers?  Constitutional:  []   Fever or chills?  Ear/Nose/Throat:  []  Change in hearing? []  Nose bleeds? []  Sore throat?  Musculoskeletal:  []  Back pain? []  Joint pain? []  Muscle pain?   Physical Examination   Vitals:   11/07/19 1423  BP: (!) 116/48  Pulse: (!) 54  Resp: 20  Temp: 98.1 F (36.7 C)  TempSrc: Temporal  SpO2: 99%  Weight: 212 lb (96.2 kg)  Height: 6\' 6"  (1.981 m)   Body mass index is 24.5 kg/m.  General:  WDWN in NAD; vital signs documented above Gait: Not observed HENT: WNL, normocephalic Pulmonary: normal non-labored breathing , without Rales, rhonchi,  wheezing Cardiac: regular HR Abdomen: soft, NT, no masses Skin: without rashes Vascular Exam/Pulses:  Right Left  Radial 2+ (normal) 2+ (normal)   Extremities: Palpable right radial pulse; symmetric grip strength; palpable thrill through basilic vein of right arm; bilateral lower extremity pitting edema to the level of the knee Musculoskeletal: no muscle wasting or atrophy  Neurologic: A&O X 3;  No focal weakness or paresthesias are detected Psychiatric:  The pt has Normal affect.   Non-invasive Vascular Imaging   right Arm Access Duplex :   Diameters:  >6 mm    Medical Decision Making   Thomas Mcguire is a 79 y.o. male who presents with chronic kidney disease stage V   Patent right arm basilic vein fistula with palpable thrill and no signs or symptoms of steal syndrome  Based on physical exam and duplex, fistula is ready for second stage transposition  Plan will be for right arm second stage basilic vein transposition with Dr. Scot Dock in the near future  Given bilateral lower extremity edema he may be approaching initiation of hemodialysis; if this is the case patient will also need [facility] placement Risk, benefits, and alternatives to access surgery were discussed.   The patient is aware the risks include but are not limited to: bleeding, infection, steal syndrome, nerve damage, thrombosis, failure to mature, and  need for additional procedures.   The patient agrees to proceed with the procedure.   ADDENDUM 11/07/19 This patient was also seen by his primary care physician earlier today.  His PCP discussed the patient with his nephrologist Dr. Carolin Sicks, and Dr. Carolin Sicks believes patient is much worse than his impression during a visit last week.  Plan now is for patient to present to the emergency department due to fluid overload and uremia for admission and likely initiation of hemodialysis.  He will need a tunneled dialysis catheter.  Due to a heavy caseload for the remainder of the week, vascular may not have OR availability and thus IR will need to place [facility].  When patient is more stable, we can proceed with second stage basilic vein transposition either during current hospitalization  or on an outpatient basis.  We will see patient in consultation.   Dagoberto Ligas PA-C [facility] Office: [phone]  Clinic MD: Scot Dock

## 2019-11-07 NOTE — ED Notes (Signed)
Pt provided with diet sprite and [place] sandwich per MD.

## 2019-11-07 NOTE — H&P (View-Only) (Signed)
Established Dialysis Access   History of Present Illness   Thomas Mcguire is a 79 y.o. (1940-03-14) male who presents for re-evaluation of permanent access.  Surgical history significant for right radiocephalic fistula creation by Dr. Scot Dock 05/2018.  This fistula never fully matured and eventually was deemed unsalvageable with fistulogram.  He then underwent for stage basilic vein fistula by Dr. Scot Dock on 10/01/2019.  He denies any signs or symptoms of steal syndrome.  He is not yet on hemodialysis.  However patient presented to the emergency department on 10/20/2019 with bilateral lower extremity edema.  He was diuresed and followed-up with his nephrologist Dr. Carolin Sicks.  During our visit today he mentions bilateral lower extremity edema and weakness.  He and his wife are willing to proceed with second stage basilic vein transposition.    The patient's PMH, PSH, SH, and FamHx were reviewed and are unchanged from prior visit.  Current Outpatient Medications  Medication Sig Dispense Refill  . allopurinol (ZYLOPRIM) 100 MG tablet Take 100 mg by mouth every Monday, Wednesday, and Friday.     Marland Kitchen amLODipine (NORVASC) 5 MG tablet Take 5 mg by mouth in the morning and at bedtime.     Marland Kitchen aspirin 81 MG tablet Take 81 mg by mouth daily.    . calcitRIOL (ROCALTROL) 0.25 MCG capsule Take 0.25 mcg by mouth every Monday, Wednesday, and Friday.   5  . carvedilol (COREG) 25 MG tablet Take 12.5 mg by mouth 2 (two) times daily with a meal.     . COD LIVER OIL PO Take 1 capsule by mouth 2 (two) times daily.     . cyanocobalamin (,VITAMIN B-12,) 1000 MCG/ML injection Vitamin B12 injection: 1000 mcg (1 mL) by subcu injection once daily for 7 days; then once weekly for 4 weeks; then once monthly thereafter.  7 07/15/2010 (Patient taking differently: Inject 1,000 mcg into the muscle every 30 (thirty) days. ) 12 mL 1  . doxazosin (CARDURA) 2 MG tablet Take 2 mg by mouth at bedtime.     . ferrous sulfate 325 (65 FE)  MG tablet Take 325 mg by mouth 2 (two) times daily with a meal.     . folic acid (FOLVITE) 1 MG tablet TAKE 1 TABLET BY MOUTH EVERY DAY (Patient taking differently: Take 1 mg by mouth daily. ) 90 tablet 1  . furosemide (LASIX) 80 MG tablet Take 1 tablet (80 mg total) by mouth 2 (two) times daily. 60 tablet 0  . insulin NPH (HUMULIN N,NOVOLIN N) 100 UNIT/ML injection Inject 50 Units into the skin 2 (two) times daily.     . insulin regular (HUMULIN R,NOVOLIN R) 100 units/mL injection Inject 20-25 Units into the skin 3 (three) times daily before meals. Sliding Scale    . isosorbide mononitrate (IMDUR) 30 MG 24 hr tablet Take 30 mg by mouth daily.  6  . potassium chloride (K-DUR) 10 MEQ tablet Take 10 mEq by mouth daily.  3  . pravastatin (PRAVACHOL) 40 MG tablet Take 40 mg by mouth at bedtime.     . sevelamer carbonate (RENVELA) 800 MG tablet Take 800 mg by mouth 3 (three) times daily.     No current facility-administered medications for this visit.    REVIEW OF SYSTEMS (negative unless checked):   Cardiac:  []  Chest pain or chest pressure? []  Shortness of breath upon activity? []  Shortness of breath when lying flat? []  Irregular heart rhythm?  Vascular:  []  Pain in calf, thigh, or  hip brought on by walking? []  Pain in feet at night that wakes you up from your sleep? []  Blood clot in your veins? []  Leg swelling?  Pulmonary:  []  Oxygen at home? []  Productive cough? []  Wheezing?  Neurologic:  []  Sudden weakness in arms or legs? []  Sudden numbness in arms or legs? []  Sudden onset of difficult speaking or slurred speech? []  Temporary loss of vision in one eye? []  Problems with dizziness?  Gastrointestinal:  []  Blood in stool? []  Vomited blood?  Genitourinary:  []  Burning when urinating? []  Blood in urine?  Psychiatric:  []  Major depression  Hematologic:  []  Bleeding problems? []  Problems with blood clotting?  Dermatologic:  []  Rashes or ulcers?  Constitutional:  []   Fever or chills?  Ear/Nose/Throat:  []  Change in hearing? []  Nose bleeds? []  Sore throat?  Musculoskeletal:  []  Back pain? []  Joint pain? []  Muscle pain?   Physical Examination   Vitals:   11/07/19 1423  BP: (!) 116/48  Pulse: (!) 54  Resp: 20  Temp: 98.1 F (36.7 C)  TempSrc: Temporal  SpO2: 99%  Weight: 212 lb (96.2 kg)  Height: 6\' 6"  (1.981 m)   Body mass index is 24.5 kg/m.  General:  WDWN in NAD; vital signs documented above Gait: Not observed HENT: WNL, normocephalic Pulmonary: normal non-labored breathing , without Rales, rhonchi,  wheezing Cardiac: regular HR Abdomen: soft, NT, no masses Skin: without rashes Vascular Exam/Pulses:  Right Left  Radial 2+ (normal) 2+ (normal)   Extremities: Palpable right radial pulse; symmetric grip strength; palpable thrill through basilic vein of right arm; bilateral lower extremity pitting edema to the level of the knee Musculoskeletal: no muscle wasting or atrophy  Neurologic: A&O X 3;  No focal weakness or paresthesias are detected Psychiatric:  The pt has Normal affect.   Non-invasive Vascular Imaging   right Arm Access Duplex :   Diameters:  >6 mm    Medical Decision Making   Thomas Mcguire is a 79 y.o. male who presents with chronic kidney disease stage V   Patent right arm basilic vein fistula with palpable thrill and no signs or symptoms of steal syndrome  Based on physical exam and duplex, fistula is ready for second stage transposition  Plan will be for right arm second stage basilic vein transposition with Dr. Scot Dock in the near future  Given bilateral lower extremity edema he may be approaching initiation of hemodialysis; if this is the case patient will also need [facility] placement Risk, benefits, and alternatives to access surgery were discussed.   The patient is aware the risks include but are not limited to: bleeding, infection, steal syndrome, nerve damage, thrombosis, failure to mature, and  need for additional procedures.   The patient agrees to proceed with the procedure.   ADDENDUM 11/07/19 This patient was also seen by his primary care physician earlier today.  His PCP discussed the patient with his nephrologist Dr. Carolin Sicks, and Dr. Carolin Sicks believes patient is much worse than his impression during a visit last week.  Plan now is for patient to present to the emergency department due to fluid overload and uremia for admission and likely initiation of hemodialysis.  He will need a tunneled dialysis catheter.  Due to a heavy caseload for the remainder of the week, vascular may not have OR availability and thus IR will need to place [facility].  When patient is more stable, we can proceed with second stage basilic vein transposition either during current hospitalization  or on an outpatient basis.  We will see patient in consultation.   Dagoberto Ligas PA-C [facility] Office: [phone]  Clinic MD: Scot Dock

## 2019-11-07 NOTE — H&P (Signed)
[name] QTM:[id number] DOB: 06-Jun-1940 DOA: 11/07/2019     PCP: Leanna Battles, MD   Outpatient Specialists:    Nephrology  Dr. Dr. Carolin Sicks.     Oncology  Dr. Benay Spice   Vascular Dr. Scot Dock Patient arrived to ER on 11/07/19 at 1813 Referred by Attending Dorie Rank, MD   Patient coming from: home Lives  With family    Chief Complaint:   Chief Complaint  Patient presents with  . Leg Swelling    HPI: Thomas Mcguire is a 79 y.o. male with medical history significant of CLL, Anemia, CKD stage V, DM 2, HTN, thrombocytopenia    Presented with    increasing leg swelling, fluid overload progressive dyspnea,  Pt was at Vascular surgery office and given severe progression Case was discussed with nephrology who recommended patient to present to emergency department. Patient has right radiocephalic fistula created by Dr. Scot Dock in May 2020 but it was deemed unsalvageable by fistulogram and never matured. He had to undergo basilic vein fistula on 01 October 2019 Plan was for him to undergo second stage basilic vein transposition but patient decompensated   Reports increased swelling in legs and weight gain not sure how much but hs pants can't close Has been taking lasix but only urinates a bit  Reports SOb able to lay down flat but better is sitting up Severe DOE No CP no N/V/D Infectious risk factors:  Reports severe fatigue    Has  been vaccinated against COVID booster on 09/24/2019   Initial COVID TEST  NEGATIVE   Lab Results  Component Value Date   SARSCOV2NAA NEGATIVE 09/29/2019   [place] NEGATIVE 09/05/2019   [place] Not Detected 01/17/2019   [place] NEGATIVE 09/20/2018    Regarding pertinent Chronic problems:   CLL followed by Dr. Benay Spice currently stable  Gout on allopurinol   Hyperlipidemia -  on statins Pravastatin Lipid Panel  No results found for: CHOL, TRIG, HDL, CHOLHDL, VLDL, LDLCALC, LDLDIRECT, LABVLDL   HTN on  Norvasc, lasix, isosorbide Cardura     DM 2 -  Lab Results  Component Value Date   HGBA1C 6.8 (H) 07/11/2018   on insulin,      CKD stage V- baseline Cr 6.5 Estimated Creatinine Clearance: 9.6 mL/min (A) (by C-G formula based on SCr of 8.35 mg/dL (H)).  Lab Results  Component Value Date   CREATININE 8.35 (H) 11/07/2019   CREATININE 6.53 (H) 10/21/2019   CREATININE 6.80 (H) 10/01/2019     Chronic anemia - baseline hg Hemoglobin & Hematocrit  Recent Labs    10/01/19 0606 10/21/19 0023 11/07/19 1902  HGB 13.6 9.4* 8.8*    While in ER: noted to have 8.35   ER Provider Called:     Dr.Peeples They Recommend admit to medicine  Plan for HD when has acssess Will see in AM   Hospitalist was called for admission for fluid overload  The following Work up has been ordered so far:  Orders Placed This Encounter  Procedures  . Respiratory Panel by RT PCR (Flu A&B, Covid) - Nasopharyngeal Swab  . DG Chest Portable 1 View  . CBC with Differential/Platelet  . Comprehensive metabolic panel  . Pathologist smear review  . Consult to nephrology  ALL PATIENTS BEING ADMITTED/HAVING PROCEDURES NEED COVID-19 SCREENING  . Consult to hospitalist  ALL PATIENTS BEING ADMITTED/HAVING PROCEDURES NEED COVID-19 SCREENING  . POC occult blood, ED RN will collect    Following Medications were ordered in ER: Medications  furosemide (LASIX) injection 80 mg (has no administration in time range)        Consult Orders  (From admission, onward)         Start     Ordered   11/07/19 2138  Consult to hospitalist  ALL PATIENTS BEING ADMITTED/HAVING PROCEDURES NEED COVID-19 SCREENING Pg sent by deloris  Once       Comments: ALL PATIENTS BEING ADMITTED/HAVING PROCEDURES NEED COVID-19 SCREENING  Provider:  (Not yet assigned)  Question Answer Comment  Place call to: [facility]   Reason for Consult Admit      11/07/19 2137          Significant initial  Findings: Abnormal Labs Reviewed   CBC WITH DIFFERENTIAL/PLATELET - Abnormal; Notable for the following components:      Result Value   WBC 16.7 (*)    RBC 3.26 (*)    Hemoglobin 8.8 (*)    HCT 28.3 (*)    RDW 17.2 (*)    Platelets 142 (*)    Lymphs Abs 10.2 (*)    Basophils Absolute 0.3 (*)    All other components within normal limits  COMPREHENSIVE METABOLIC PANEL - Abnormal; Notable for the following components:   Sodium 132 (*)    CO2 17 (*)    Glucose, Bld 151 (*)    BUN 156 (*)    Creatinine, Ser 8.35 (*)    Calcium 7.6 (*)    Total Protein 5.9 (*)    Albumin 3.0 (*)    Total Bilirubin 1.3 (*)    GFR, Estimated 6 (*)    Anion gap 17 (*)    All other components within normal limits    Otherwise labs showing:    Recent Labs  Lab 11/07/19 1902  NA 132*  K 4.5  CO2 17*  GLUCOSE 151*  BUN 156*  CREATININE 8.35*  CALCIUM 7.6*   Cr   Up from baseline see below Lab Results  Component Value Date   CREATININE 8.35 (H) 11/07/2019   CREATININE 6.53 (H) 10/21/2019   CREATININE 6.80 (H) 10/01/2019    Recent Labs  Lab 11/07/19 1902  AST 27  ALT 36  ALKPHOS 64  BILITOT 1.3*  PROT 5.9*  ALBUMIN 3.0*   Lab Results  Component Value Date   CALCIUM 7.6 (L) 11/07/2019   WBC      Component Value Date/Time   WBC 16.7 (H) 11/07/2019 1902   LYMPHSABS 10.2 (H) 11/07/2019 1902   MONOABS 0.2 11/07/2019 1902   EOSABS 0.0 11/07/2019 1902   BASOSABS 0.3 (H) 11/07/2019 1902    Plt: Lab Results  Component Value Date   PLT 142 (L) 11/07/2019     HG/HCT    Down  from baseline see below    Component Value Date/Time   HGB 8.8 (L) 11/07/2019 1902   HGB 11.5 (L) 09/24/2019 0929   HCT 28.3 (L) 11/07/2019 1902   MCV 86.8 11/07/2019 1902    No results for input(s): LIPASE, AMYLASE in the last 168 hours. No results for input(s): AMMONIA in the last 168 hours.    ECG: Ordered     BNP (last 3 results) Recent Labs    10/21/19 0023  BNP 1,915.6*     UA not ordered     CXR - cardiomegaly with  mild CHF    ED Triage Vitals  Enc Vitals Group     BP 11/07/19 1822 (!) 133/50     Pulse Rate 11/07/19 1822 Marland Kitchen)  59     Resp 11/07/19 1822 16     Temp 11/07/19 1822 98.2 F (36.8 C)     Temp Source 11/07/19 1822 Oral     SpO2 11/07/19 1822 99 %     Weight --      Height --      Head Circumference --      Peak Flow --      Pain Score 11/07/19 1851 0     Pain Loc --      Pain Edu? --      Excl. in [place]? --   TMAX(24)@       Latest  Blood pressure (!) 134/49, pulse (!) 58, temperature 98.2 F (36.8 C), temperature source Oral, resp. rate 18, SpO2 99 %.    Review of Systems:    Pertinent positives include:  Fatigue, weight gain shortness of breath at rest. dyspnea on exertion Constitutional:  No weight loss, night sweats, Fevers, chills,  HEENT:  No headaches, Difficulty swallowing,Tooth/dental problems,Sore throat,  No sneezing, itching, ear ache, nasal congestion, post nasal drip,  Cardio-vascular:  No chest pain, Orthopnea, PND, anasarca, dizziness, palpitations.no Bilateral lower extremity swelling  GI:  No heartburn, indigestion, abdominal pain, nausea, vomiting, diarrhea, change in bowel habits, loss of appetite, melena, blood in stool, hematemesis Resp:    No excess mucus, no productive cough, No non-productive cough, No coughing up of blood.No change in color of mucus.No wheezing. Skin:  no rash or lesions. No jaundice GU:  no dysuria, change in color of urine, no urgency or frequency. No straining to urinate.  No flank pain.  Musculoskeletal:  No joint pain or no joint swelling. No decreased range of motion. No back pain.  Psych:  No change in mood or affect. No depression or anxiety. No memory loss.  Neuro: no localizing neurological complaints, no tingling, no weakness, no double vision, no gait abnormality, no slurred speech, no confusion  All systems reviewed and apart from [place] all are negative  Past Medical History:   Past Medical History:  Diagnosis  Date  . Anemia    low iron  . Arthritis   . Chronic renal insufficiency    CHECKED Q3MOS PER PT. Stage 4 as of 05/22/2018 per pt.  . Diabetes mellitus    type 2  . ESRD (end stage renal disease) ([place])   . Gout    Has not had recently- 08/24/11  . Hyperlipidemia   . Hypertension   . 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 PAD (peripheral artery disease) ([place])   . Wears dentures       Past Surgical History:  Procedure Laterality Date  . A/V FISTULAGRAM N/A 08/11/2018   Procedure: A/V FISTULAGRAM - Right Arm;  Surgeon: Angelia Mould, MD;  Location: [facility];  Service: Cardiovascular;  Laterality: N/A;  . A/V FISTULAGRAM N/A 09/07/2019   Procedure: A/V FISTULAGRAM - Right AVF;  Surgeon: Angelia Mould, MD;  Location: [facility];  Service: Cardiovascular;  Laterality: N/A;  . ABDOMINAL AORTOGRAM W/LOWER EXTREMITY Right 07/03/2018   Procedure: ABDOMINAL AORTOGRAM W/LOWER EXTREMITY;  Surgeon: Waynetta Sandy, MD;  Location: [facility] [facility];  Service: Cardiovascular;  Laterality: Right;  . AMPUTATION  08/27/2011   Procedure: AMPUTATION DIGIT;  Surgeon: Newt Minion, MD;  Location: [facility];  Service: Orthopedics;  Laterality: Left;  Left Foot 3rd toe Amputation MTP joint  . AMPUTATION Right 07/11/2018   Procedure:  AMPUTATION RIGHT GREAT TOE;  Surgeon: Angelia Mould, MD;  Location: [facility];  Service: Vascular;  Laterality: Right;  . amputation great toe  2009   left  . AV FISTULA PLACEMENT Right 05/23/2018   Procedure: RADIAL- CEPHALIC ARTERIOVENOUS (AV) FISTULA CREATION RIGHT ARM AND LIGATION OF COMPETING BRANCH.;  Surgeon: Angelia Mould, MD;  Location: [facility];  Service: Vascular;  Laterality: Right;  . BASCILIC VEIN TRANSPOSITION Right 10/01/2019   Procedure: FIRST STAGE BASCILIC VEIN TRANSPOSITION;  Surgeon: Angelia Mould, MD;  Location: [facility];  Service: Vascular;  Laterality: Right;  . COLONOSCOPY    .  cyst elbow  1999   right  . FEMORAL-POPLITEAL BYPASS GRAFT Right 07/11/2018   Procedure: BYPASS GRAFT FEMORAL-POPLITEAL ARTERY;  Surgeon: Angelia Mould, MD;  Location: [facility];  Service: Vascular;  Laterality: Right;  . FOOT SURGERY     both feet for something that was torn  . LIGATION OF ARTERIOVENOUS  FISTULA Right 10/01/2019   Procedure: LIGATION OF RIGHT RADIOCEPHALIC  ARTERIOVENOUS  FISTULA;  Surgeon: Angelia Mould, MD;  Location: [facility];  Service: Vascular;  Laterality: Right;  . TOE AMPUTATION  2010   left 2nd toe  . UPPER GI ENDOSCOPY      Social History:  Ambulatory   Kasandra Knudsen,      reports that he quit smoking about 23 years ago. His smoking use included cigarettes. He has never used smokeless tobacco. He reports that he does not drink alcohol and does not use drugs.   Family History:   Family History  Problem Relation Age of Onset  . Colon cancer Neg Hx     Allergies: No Known Allergies   Prior to Admission medications   Medication Sig Start Date End Date Taking? Authorizing Provider  allopurinol (ZYLOPRIM) 100 MG tablet Take 100 mg by mouth every Monday, Wednesday, and Friday.  09/29/18   [provider]  amLODipine (NORVASC) 5 MG tablet Take 5 mg by mouth in the morning and at bedtime.     [provider]  aspirin 81 MG tablet Take 81 mg by mouth daily.    [provider]  calcitRIOL (ROCALTROL) 0.25 MCG capsule Take 0.25 mcg by mouth every Monday, Wednesday, and Friday.  10/27/17   [provider]  carvedilol (COREG) 25 MG tablet Take 12.5 mg by mouth 2 (two) times daily with a meal.     [provider]  COD LIVER OIL PO Take 1 capsule by mouth 2 (two) times daily.     [provider]  cyanocobalamin (,VITAMIN B-12,) 1000 MCG/ML injection Vitamin B12 injection: 1000 mcg (1 mL) by subcu injection once daily for 7 days; then once weekly for 4 weeks; then once monthly thereafter.  7 07/15/2010 Patient  taking differently: Inject 1,000 mcg into the muscle every 30 (thirty) days.  12/21/18   Ladell Pier, MD  doxazosin (CARDURA) 2 MG tablet Take 2 mg by mouth at bedtime.     [provider]  ferrous sulfate 325 (65 FE) MG tablet Take 325 mg by mouth 2 (two) times daily with a meal.     [provider]  folic acid (FOLVITE) 1 MG tablet TAKE 1 TABLET BY MOUTH EVERY DAY Patient taking differently: Take 1 mg by mouth daily.  09/06/19   Ladell Pier, MD  furosemide (LASIX) 80 MG tablet Take 1 tablet (80 mg total) by mouth 2 (two) times daily. 09/20/18   Daleen Bo, MD  insulin NPH (HUMULIN N,NOVOLIN N) 100 UNIT/ML injection Inject 50 Units into the skin 2 (two) times daily.     [provider]  insulin regular (HUMULIN R,NOVOLIN R) 100 units/mL injection Inject 20-25 Units into the skin 3 (three) times daily before meals. Sliding Scale    [provider]  isosorbide mononitrate (IMDUR) 30 MG 24 hr tablet Take 30 mg by mouth daily. 11/07/17   [provider]  potassium chloride (K-DUR) 10 MEQ tablet Take 10 mEq by mouth daily. 11/02/17   [provider]  pravastatin (PRAVACHOL) 40 MG tablet Take 40 mg by mouth at bedtime.     [provider]  sevelamer carbonate (RENVELA) 800 MG tablet Take 800 mg by mouth 3 (three) times daily. 10/05/19   [provider]   Physical Exam: Vitals with BMI 11/07/2019 11/07/2019 11/07/2019  Height - - 6\' 6"   Weight - - 212 lbs  BMI - - 13.2  Systolic 440 102 725  Diastolic 49 50 48  Pulse 58 59 54     1. General:  in No  Acute distress   Chronically ill    -appearing 2. Psychological: Alert and  Oriented 3. Head/ENT:   Moist  Mucous Membranes                          Head Non traumatic, neck supple                           Poor Dentition 4. SKIN: normal   Skin turgor,  Skin clean Dry and intact no rash Right arm thrill noted 5. Heart: Regular rate and rhythm no  Murmur, no Rub or  gallop 6. Lungs:  no wheezes some crackles   7. Abdomen: Soft, non-tender,  distended  bowel sounds present 8. Lower extremities: no clubbing, cyanosis,  3+ edema, sp bilateral first digits amputation of the lower extremities 9. Neurologically Grossly intact, moving all 4 extremities equally  10. MSK: Normal range of motion   All other LABS:     Recent Labs  Lab 11/07/19 1902  WBC 16.7*  NEUTROABS 6.0  HGB 8.8*  HCT 28.3*  MCV 86.8  PLT 142*     Recent Labs  Lab 11/07/19 1902  NA 132*  K 4.5  CL 98  CO2 17*  GLUCOSE 151*  BUN 156*  CREATININE 8.35*  CALCIUM 7.6*     Recent Labs  Lab 11/07/19 1902  AST 27  ALT 36  ALKPHOS 64  BILITOT 1.3*  PROT 5.9*  ALBUMIN 3.0*       Cultures: No results found for: SDES, SPECREQUEST, CULT, REPTSTATUS   Radiological Exams on Admission: DG Chest Portable 1 View  Result Date: 11/07/2019 CLINICAL DATA:  79 year old male with renal failure. EXAM: PORTABLE CHEST 1 VIEW COMPARISON:  Chest radiograph dated 10/20/2019 FINDINGS: Cardiomegaly with vascular congestion and mild edema. No focal consolidation, pleural effusion, pneumothorax. Atherosclerotic calcification of the aorta. No acute osseous pathology. IMPRESSION: Cardiomegaly with mild CHF or fluid overload. Electronically Signed   By: Anner Crete M.D.   On: 11/07/2019 21:35   VAS US DUPLEX DIALYSIS ACCESS (AVF, AVG)  Result Date: 11/07/2019 DIALYSIS ACCESS Reason for Exam: Routine follow up. Access Site: Right Upper Extremity. Access Type: Basilic vein transposition 10/01/2019 with ligation of right              radiocephalic AVF. Performing Technologist: Ronal Fear  RVS, RCS  Examination Guidelines: A complete evaluation includes B-mode imaging, spectral Doppler, color Doppler, and power Doppler as needed of all accessible portions of each vessel. Unilateral testing is considered an integral part of a complete examination. Limited examinations for reoccurring  indications may be performed as noted.  Findings: +--------------------+----------+-----------------+--------+ AVF                 PSV (cm/s)Flow Vol (mL/min)Comments +--------------------+----------+-----------------+--------+ Native artery inflow   256           902                +--------------------+----------+-----------------+--------+ AVF Anastomosis        647                              +--------------------+----------+-----------------+--------+  +------------+----------+-------------+----------+--------+ OUTFLOW VEINPSV (cm/s)Diameter (cm)Depth (cm)Describe +------------+----------+-------------+----------+--------+ Prox UA        157        0.66        0.80            +------------+----------+-------------+----------+--------+ Mid UA         215        0.70        1.13            +------------+----------+-------------+----------+--------+ Dist UA        310        0.60        1.23            +------------+----------+-------------+----------+--------+ AC Fossa       665        0.52        0.95            +------------+----------+-------------+----------+--------+   Summary: 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 *See table(s) above for measurements and observations.  Diagnosing physician: Deitra Mayo MD Electronically signed by Deitra Mayo MD on 11/07/2019 at 2:53:42 PM.   --------------------------------------------------------------------------------   Final     Chart has been reviewed    Assessment/Plan   79 y.o. male with medical history significant of CLL, Anemia, CKD stage V, DM 2, HTN, thrombocytopenia     Admitted for fluid overload in the setting of decompensated CKD stage V  Present on Admission: . Fluid overload -in the setting of severe CKD nephrology aware plan for IR consult in a.m. for catheter placement and HD initiation No emergent indication for HD tonight. Continue to monitor patient still  makes small amount of urine Lasix was given in ER  . Acute on chronic CKD (chronic kidney disease) -avoid nephrotoxic medications and hypotension, plan for hemodialysis once access available   . Folic acid deficiency -chronic stable   . Chronic lymphocytic leukemia (CLL), B-cell (HCC) -chronic stable followed by hematology oncology  . Cardiomegaly -obtain echogram to further evaluate  Dm2 -  - Order Sensitive  SSI   -Decrease home regimen of NPH down to 25 units well n.p.o. and also may have decreased clearance  -  check TSH and HgA1C   History of hypertension hold home medications to allow permissive hypertension to allow blood pressure room for dialysis  HLD continue home medications  Anemia-  could be dilutional as well as anemia of chronic disease follow CBC obtain anemia panel in a.m.    Other plan as per orders.  DVT prophylaxis:  SCD        Code Status:    Code Status:  Prior FULL CODE  as per patient  I had personally discussed CODE STATUS with patient     Family Communication:   Family not at  Bedside    Disposition Plan:     To home once workup is complete and patient is stable   Following barriers for discharge:                            Electrolytes corrected                               Anemia stable                                                        Will need to be able to tolerate PO                                                      Will need consultants to evaluate patient prior to discharge                      Would benefit from PT/OT eval prior to DC  Ordered                                     Consults called: Nephrology aware, IR consult ordered  Admission status:  ED Disposition    ED Disposition Condition [place]: [place] [100100]  Level of Care: Telemetry Medical [104]  May admit patient to [facility] or [facility] if equivalent level of care is available:: No  Covid Evaluation:  Confirmed COVID Negative  Diagnosis: Fluid overload [923300]  Admitting Physician: Toy Baker [[id number]]  Attending Physician: Toy Baker [[id number]]  Estimated length of stay: past midnight tomorrow  Certification:: I certify this patient will need inpatient services for at least 2 midnights         inpatient     I Expect 2 midnight stay secondary to severity of patient's current illness need for inpatient interventions justified by the following:  Severe lab/radiological/exam abnormalities including:   Severe acute on chronic renal failure and fluid overload  and extensive comorbidities including:  DM2    CKD   malignancy, .    That are currently affecting medical management.   I expect  patient to be hospitalized for 2 midnights requiring inpatient medical care.  Patient is at high risk for adverse outcome (such as loss of life or disability) if not treated.  Indication for inpatient stay as follows:    inability to maintain oral hydration    Need for operative/procedural  intervention    Need for IV diuretics need for urgent hemodialysis    Level of care     tele  For  24H    COVID-19 Labs    Lab Results  Component Value Date   [place] NEGATIVE 11/07/2019     Precautions: admitted as Covid Negative    PPE: Used  by the provider:   P100  eye Goggles,  Gloves      Shaquille Murdy 11/08/2019, 1:06 AM    [facility]     after 2 AM please page floor coverage PA If 7AM-7PM, please contact the day team taking care of the patient using [email or web address]   Patient was evaluated in the context of the global COVID-19 pandemic, which necessitated consideration that the patient might be at risk for infection with the SARS-CoV-2 virus that causes COVID-19. Institutional protocols and algorithms that pertain to the evaluation of patients at risk for COVID-19 are in a state of rapid change based on information released by regulatory bodies  including the CDC and federal and state organizations. These policies and algorithms were followed during the patient's care.

## 2019-11-08 ENCOUNTER — Inpatient Hospital Stay (HOSPITAL_COMMUNITY): Payer: Medicare Other

## 2019-11-08 DIAGNOSIS — I509 Heart failure, unspecified: Secondary | ICD-10-CM

## 2019-11-08 DIAGNOSIS — I340 Nonrheumatic mitral (valve) insufficiency: Secondary | ICD-10-CM

## 2019-11-08 DIAGNOSIS — I351 Nonrheumatic aortic (valve) insufficiency: Secondary | ICD-10-CM | POA: Diagnosis not present

## 2019-11-08 DIAGNOSIS — R7989 Other specified abnormal findings of blood chemistry: Secondary | ICD-10-CM

## 2019-11-08 DIAGNOSIS — D72820 Lymphocytosis (symptomatic): Secondary | ICD-10-CM

## 2019-11-08 DIAGNOSIS — E8770 Fluid overload, unspecified: Secondary | ICD-10-CM

## 2019-11-08 DIAGNOSIS — N189 Chronic kidney disease, unspecified: Secondary | ICD-10-CM

## 2019-11-08 DIAGNOSIS — I361 Nonrheumatic tricuspid (valve) insufficiency: Secondary | ICD-10-CM

## 2019-11-08 DIAGNOSIS — E872 Acidosis: Secondary | ICD-10-CM

## 2019-11-08 DIAGNOSIS — N179 Acute kidney failure, unspecified: Secondary | ICD-10-CM

## 2019-11-08 HISTORY — PX: IR US GUIDE VASC ACCESS RIGHT: IMG2390

## 2019-11-08 HISTORY — DX: Heart failure, unspecified: I50.9

## 2019-11-08 HISTORY — PX: IR FLUORO GUIDE CV LINE RIGHT: IMG2283

## 2019-11-08 LAB — COMPREHENSIVE METABOLIC PANEL
ALT: 35 U/L (ref 0–44)
AST: 24 U/L (ref 15–41)
Albumin: 3 g/dL — ABNORMAL LOW (ref 3.5–5.0)
Alkaline Phosphatase: 59 U/L (ref 38–126)
Anion gap: 17 — ABNORMAL HIGH (ref 5–15)
BUN: 158 mg/dL — ABNORMAL HIGH (ref 8–23)
CO2: 18 mmol/L — ABNORMAL LOW (ref 22–32)
Calcium: 7.6 mg/dL — ABNORMAL LOW (ref 8.9–10.3)
Chloride: 99 mmol/L (ref 98–111)
Creatinine, Ser: 8.26 mg/dL — ABNORMAL HIGH (ref 0.61–1.24)
GFR, Estimated: 6 mL/min — ABNORMAL LOW (ref >60)
Glucose, Bld: 129 mg/dL — ABNORMAL HIGH (ref 70–99)
Potassium: 4.3 mmol/L (ref 3.5–5.1)
Sodium: 134 mmol/L — ABNORMAL LOW (ref 135–145)
Total Bilirubin: 1 mg/dL (ref 0.3–1.2)
Total Protein: 5.7 g/dL — ABNORMAL LOW (ref 6.5–8.1)

## 2019-11-08 LAB — CBC WITH DIFFERENTIAL/PLATELET
Abs Immature Granulocytes: 0.04 10*3/uL (ref 0.00–0.07)
Basophils Absolute: 0 10*3/uL (ref 0.0–0.1)
Basophils Relative: 0 %
Eosinophils Absolute: 0.1 10*3/uL (ref 0.0–0.5)
Eosinophils Relative: 1 %
HCT: 27.5 % — ABNORMAL LOW (ref 39.0–52.0)
Hemoglobin: 8.6 g/dL — ABNORMAL LOW (ref 13.0–17.0)
Immature Granulocytes: 0 %
Lymphocytes Relative: 55 %
Lymphs Abs: 7.6 10*3/uL — ABNORMAL HIGH (ref 0.7–4.0)
MCH: 26.4 pg (ref 26.0–34.0)
MCHC: 31.3 g/dL (ref 30.0–36.0)
MCV: 84.4 fL (ref 80.0–100.0)
Monocytes Absolute: 1 10*3/uL (ref 0.1–1.0)
Monocytes Relative: 7 %
Neutro Abs: 5 10*3/uL (ref 1.7–7.7)
Neutrophils Relative %: 37 %
Platelets: 132 10*3/uL — ABNORMAL LOW (ref 150–400)
RBC: 3.26 MIL/uL — ABNORMAL LOW (ref 4.22–5.81)
RDW: 17.2 % — ABNORMAL HIGH (ref 11.5–15.5)
WBC: 13.7 10*3/uL — ABNORMAL HIGH (ref 4.0–10.5)
nRBC: 0 % (ref 0.0–0.2)

## 2019-11-08 LAB — ECHOCARDIOGRAM COMPLETE
Area-P 1/2: 2.5 cm2
Height: 78 in
MV M vel: 4.81 m/s
MV Peak grad: 92.5 mmHg
P 1/2 time: 335 msec
Radius: 0.7 cm
S' Lateral: 4.76 cm
Weight: 3392 oz

## 2019-11-08 LAB — IRON AND TIBC
Iron: 28 ug/dL — ABNORMAL LOW (ref 45–182)
Saturation Ratios: 12 % — ABNORMAL LOW (ref 17.9–39.5)
TIBC: 232 ug/dL — ABNORMAL LOW (ref 250–450)
UIBC: 204 ug/dL

## 2019-11-08 LAB — PROTIME-INR
INR: 1.4 — ABNORMAL HIGH (ref 0.8–1.2)
Prothrombin Time: 16.6 seconds — ABNORMAL HIGH (ref 11.4–15.2)

## 2019-11-08 LAB — MAGNESIUM: Magnesium: 2.4 mg/dL (ref 1.7–2.4)

## 2019-11-08 LAB — VITAMIN B12: Vitamin B-12: 2893 pg/mL — ABNORMAL HIGH (ref 180–914)

## 2019-11-08 LAB — PATHOLOGIST SMEAR REVIEW

## 2019-11-08 LAB — FOLATE: Folate: 65.7 ng/mL (ref >5.9)

## 2019-11-08 LAB — HEMOGLOBIN A1C
Hgb A1c MFr Bld: 6.2 % — ABNORMAL HIGH (ref 4.8–5.6)
Mean Plasma Glucose: 131 mg/dL

## 2019-11-08 LAB — GLUCOSE, CAPILLARY
Glucose-Capillary: 122 mg/dL — ABNORMAL HIGH (ref 70–99)
Glucose-Capillary: 111 mg/dL — ABNORMAL HIGH (ref 70–99)
Glucose-Capillary: 119 mg/dL — ABNORMAL HIGH (ref 70–99)
Glucose-Capillary: 122 mg/dL — ABNORMAL HIGH (ref 70–99)
Glucose-Capillary: 215 mg/dL — ABNORMAL HIGH (ref 70–99)

## 2019-11-08 LAB — PHOSPHORUS: Phosphorus: 6.2 mg/dL — ABNORMAL HIGH (ref 2.5–4.6)

## 2019-11-08 LAB — RETICULOCYTES
Immature Retic Fract: 20.7 % — ABNORMAL HIGH (ref 2.3–15.9)
RBC.: 3.21 MIL/uL — ABNORMAL LOW (ref 4.22–5.81)
Retic Count, Absolute: 73.2 10*3/uL (ref 19.0–186.0)
Retic Ct Pct: 2.3 % (ref 0.4–3.1)

## 2019-11-08 LAB — TSH: TSH: 1.517 u[IU]/mL (ref 0.350–4.500)

## 2019-11-08 LAB — FERRITIN: Ferritin: 270 ng/mL (ref 24–336)

## 2019-11-08 MED ORDER — SODIUM CHLORIDE 0.9% FLUSH
3.0000 mL | INTRAVENOUS | Status: DC | PRN
  Administered 2019-11-11: 3 mL via INTRAVENOUS

## 2019-11-08 MED ORDER — CEFAZOLIN SODIUM-DEXTROSE 2-4 GM/100ML-% IV SOLN
INTRAVENOUS | Status: AC
  Filled 2019-11-08: qty 100

## 2019-11-08 MED ORDER — LIDOCAINE HCL 1 % IJ SOLN
INTRAMUSCULAR | Status: AC
  Filled 2019-11-08: qty 20

## 2019-11-08 MED ORDER — FUROSEMIDE 10 MG/ML IJ SOLN
80.0000 mg | Freq: Once | INTRAMUSCULAR | Status: AC
  Administered 2019-11-08: 80 mg via INTRAVENOUS
  Filled 2019-11-08: qty 8

## 2019-11-08 MED ORDER — FENTANYL CITRATE (PF) 100 MCG/2ML IJ SOLN
INTRAMUSCULAR | Status: AC
Start: 2019-11-08 — End: 2019-11-09
  Filled 2019-11-08: qty 2

## 2019-11-08 MED ORDER — MIDAZOLAM HCL 2 MG/2ML IJ SOLN
INTRAMUSCULAR | Status: AC
  Filled 2019-11-08: qty 2

## 2019-11-08 MED ORDER — CEFAZOLIN SODIUM-DEXTROSE 2-4 GM/100ML-% IV SOLN
2.0000 g | INTRAVENOUS | Status: AC
  Administered 2019-11-08: 2 g via INTRAVENOUS

## 2019-11-08 MED ORDER — ACETAMINOPHEN 650 MG RE SUPP: 650.0000 mg | Freq: Four times a day (QID) | RECTAL | Status: DC | PRN

## 2019-11-08 MED ORDER — ACETAMINOPHEN 325 MG PO TABS
650.0000 mg | ORAL_TABLET | Freq: Four times a day (QID) | ORAL | Status: DC | PRN
  Filled 2019-11-08: qty 2

## 2019-11-08 MED ORDER — FENTANYL CITRATE (PF) 100 MCG/2ML IJ SOLN
INTRAMUSCULAR | Status: AC | PRN
  Administered 2019-11-08: 25 ug via INTRAVENOUS

## 2019-11-08 MED ORDER — MIDAZOLAM HCL 2 MG/2ML IJ SOLN
INTRAMUSCULAR | Status: AC | PRN
  Administered 2019-11-08: 1 mg via INTRAVENOUS

## 2019-11-08 MED ORDER — CALCITRIOL 0.25 MCG PO CAPS
0.2500 ug | ORAL_CAPSULE | ORAL | Status: DC
  Administered 2019-11-09: 0.25 ug via ORAL
  Filled 2019-11-08: qty 1

## 2019-11-08 MED ORDER — PRAVASTATIN SODIUM 40 MG PO TABS
40.0000 mg | ORAL_TABLET | Freq: Every day | ORAL | Status: DC
  Administered 2019-11-08 – 2019-11-13 (×6): 40 mg via ORAL
  Filled 2019-11-08 (×7): qty 1

## 2019-11-08 MED ORDER — SODIUM CHLORIDE 0.9 % IV SOLN: 250.0000 mL | INTRAVENOUS | Status: DC | PRN

## 2019-11-08 MED ORDER — DARBEPOETIN ALFA 100 MCG/0.5ML IJ SOSY: 100.0000 ug | PREFILLED_SYRINGE | INTRAMUSCULAR | Status: DC

## 2019-11-08 MED ORDER — SEVELAMER CARBONATE 800 MG PO TABS
800.0000 mg | ORAL_TABLET | Freq: Three times a day (TID) | ORAL | Status: DC
  Administered 2019-11-09 – 2019-11-12 (×10): 800 mg via ORAL
  Filled 2019-11-08 (×12): qty 1

## 2019-11-08 MED ORDER — HYDROCODONE-ACETAMINOPHEN 5-325 MG PO TABS
1.0000 | ORAL_TABLET | ORAL | Status: DC | PRN
  Administered 2019-11-13: 1 via ORAL
  Filled 2019-11-08: qty 1

## 2019-11-08 MED ORDER — SODIUM CHLORIDE 0.9% FLUSH
3.0000 mL | Freq: Two times a day (BID) | INTRAVENOUS | Status: DC
  Administered 2019-11-08 – 2019-11-13 (×12): 3 mL via INTRAVENOUS

## 2019-11-08 MED ORDER — KIDNEY FAILURE BOOK
Freq: Once | Status: AC
  Filled 2019-11-08: qty 1

## 2019-11-08 MED ORDER — CHLORHEXIDINE GLUCONATE CLOTH 2 % EX PADS
6.0000 | MEDICATED_PAD | Freq: Every day | CUTANEOUS | Status: DC
  Administered 2019-11-08 – 2019-11-09 (×2): 6 via TOPICAL

## 2019-11-08 MED ORDER — INSULIN NPH (HUMAN) (ISOPHANE) 100 UNIT/ML ~~LOC~~ SUSP
25.0000 [IU] | Freq: Every day | SUBCUTANEOUS | Status: DC
  Filled 2019-11-08: qty 10

## 2019-11-08 MED ORDER — ONDANSETRON HCL 4 MG PO TABS: 4.0000 mg | ORAL_TABLET | Freq: Four times a day (QID) | ORAL | Status: DC | PRN

## 2019-11-08 MED ORDER — ONDANSETRON HCL 4 MG/2ML IJ SOLN: 4.0000 mg | Freq: Four times a day (QID) | INTRAMUSCULAR | Status: DC | PRN

## 2019-11-08 MED ORDER — HEPARIN SODIUM (PORCINE) 1000 UNIT/ML IJ SOLN
INTRAMUSCULAR | Status: AC
  Filled 2019-11-08: qty 1

## 2019-11-08 NOTE — Sedation Documentation (Signed)
Vital signs stable. No complaints from pt at this time, he responds to conversation appropriately

## 2019-11-08 NOTE — Sedation Documentation (Signed)
Pt tolerated procedure well. No complaints. Report called to flor RN.  Total time 14 mins 28mcg fentanyl 1mg  versed 2gm ancef

## 2019-11-08 NOTE — Progress Notes (Signed)
Renal Navigator met with patient and wife at bedside to discuss outpatient HD referral per request by Dr. G[place]/Nephrologist. Patient and wife welcoming of visit and extremely pleasant. Patient confirms plan to initiate dialysis and states he is awaiting catheter placement today and then first treatment. He seems very knowledgeable about the process, and knows that outpatient treatments are 3 times per week/importance of compliance. We discussed current clinic seat availability and patient states he drives and is open to any clinic. Patient is vaccinated for COVID, with booster, and upon further discussion, patient thinks he would like to be set up at the [facility]. Patient's home is in [place]. Navigator explained that he can request a transfer to a [facility] clinic in the future if he decides [place] is too far to drive. ([facility] to patient's home, but may not have seat availability at this time). He states agreement. He would very much like a second shift seat. Navigator recommends that he not drive to his first few appointments to allow his body to adjust to treatments and see how he is feeling before driving again. He agrees and wife states she can drive him.  Navigator submitted referral to [facility] to [facility] any day. Referral is pending HepB labs, which will be collected once patient has his first treatment.  Navigator will follow closely.  Shaw, Colleen Elizabeth, LCSW Renal Navigator [phone] 

## 2019-11-08 NOTE — Sedation Documentation (Signed)
Patient is resting comfortably. 

## 2019-11-08 NOTE — Progress Notes (Signed)
  Echocardiogram 2D Echocardiogram has been performed.  Thomas Mcguire G Thomas Mcguire 11/08/2019, 10:28 AM

## 2019-11-08 NOTE — Sedation Documentation (Signed)
Called to dialysis to see if pt can come now they state he will need to go back to room and HD will call for him.

## 2019-11-08 NOTE — Sedation Documentation (Signed)
Pt arrives to IR alert and oriented x4, npo, consent signed for hemodialysis catheter placement. Pt denies pain. Assisted pt to IR table for procedure with another nurse and IR techs. Pt tolerated very well. Vitals stable

## 2019-11-08 NOTE — Procedures (Signed)
  Procedure: R IJ tunneled HD catheter placement   EBL:   minimal Complications:  none immediate  See full dictation in [facility].  Dillard Cannon MD Main # [phone] Pager  [phone] Mobile [phone]

## 2019-11-08 NOTE — Consult Note (Signed)
Chief Complaint: Patient was seen in consultation today for tunneled dialysis catheter placement Chief Complaint  Patient presents with  . Leg Swelling   at the request of Dr Shirl Harris  Referring Physician(s): Dr Roswell Nickel  Supervising Physician: Markus Daft  Patient Status: [facility] - In-pt  History of Present Illness: Thomas Mcguire is a 79 y.o. male   CLL; Anemia; DM; HTN Thormbocytopenia CKD 5  Increasing SOB UOP decreasing Fluid overload LE Edema  Referred to ED: Cr 8.35 yesterday- 8.26 today Cr 6.8 1 mo ago  Rt arm fistula placed 05/2018-- never matured and unsalvageable per notes New fistula placed 09/2019--- second stage was to be performed directly But decompensation of pt OR delayed  Nephrology requesting initiation of dialysis asap Scheduled for tunneled dialysis catheter today  Past Medical History:  Diagnosis Date  . Anemia    low iron  . Arthritis   . Chronic renal insufficiency    CHECKED Q3MOS PER PT. Stage 4 as of 05/22/2018 per pt.  . Diabetes mellitus    type 2  . ESRD (end stage renal disease) ([place])   . Gout    Has not had recently- 08/24/11  . Hyperlipidemia   . Hypertension   . 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 PAD (peripheral artery disease) ([place])   . Wears dentures     Past Surgical History:  Procedure Laterality Date  . A/V FISTULAGRAM N/A 08/11/2018   Procedure: A/V FISTULAGRAM - Right Arm;  Surgeon: Angelia Mould, MD;  Location: [place] [facility];  Service: Cardiovascular;  Laterality: N/A;  . A/V FISTULAGRAM N/A 09/07/2019   Procedure: A/V FISTULAGRAM - Right AVF;  Surgeon: Angelia Mould, MD;  Location: [place] [facility];  Service: Cardiovascular;  Laterality: N/A;  . ABDOMINAL AORTOGRAM W/LOWER EXTREMITY Right 07/03/2018   Procedure: ABDOMINAL AORTOGRAM W/LOWER EXTREMITY;  Surgeon: Waynetta Sandy, MD;  Location: [place] [facility];  Service: Cardiovascular;   Laterality: Right;  . AMPUTATION  08/27/2011   Procedure: AMPUTATION DIGIT;  Surgeon: Newt Minion, MD;  Location: [place];  Service: Orthopedics;  Laterality: Left;  Left Foot 3rd toe Amputation MTP joint  . AMPUTATION Right 07/11/2018   Procedure: AMPUTATION RIGHT GREAT TOE;  Surgeon: Angelia Mould, MD;  Location: [place];  Service: Vascular;  Laterality: Right;  . amputation great toe  2009   left  . AV FISTULA PLACEMENT Right 05/23/2018   Procedure: RADIAL- CEPHALIC ARTERIOVENOUS (AV) FISTULA CREATION RIGHT ARM AND LIGATION OF COMPETING BRANCH.;  Surgeon: Angelia Mould, MD;  Location: [place];  Service: Vascular;  Laterality: Right;  . BASCILIC VEIN TRANSPOSITION Right 10/01/2019   Procedure: FIRST STAGE BASCILIC VEIN TRANSPOSITION;  Surgeon: Angelia Mould, MD;  Location: [place];  Service: Vascular;  Laterality: Right;  . COLONOSCOPY    . cyst elbow  1999   right  . FEMORAL-POPLITEAL BYPASS GRAFT Right 07/11/2018   Procedure: BYPASS GRAFT FEMORAL-POPLITEAL ARTERY;  Surgeon: Angelia Mould, MD;  Location: [place];  Service: Vascular;  Laterality: Right;  . FOOT SURGERY     both feet for something that was torn  . LIGATION OF ARTERIOVENOUS  FISTULA Right 10/01/2019   Procedure: LIGATION OF RIGHT RADIOCEPHALIC  ARTERIOVENOUS  FISTULA;  Surgeon: Angelia Mould, MD;  Location: [place];  Service: Vascular;  Laterality: Right;  . TOE AMPUTATION  2010   left 2nd toe  . UPPER GI ENDOSCOPY  Allergies: Patient has no known allergies.  Medications: Prior to Admission medications   Medication Sig Start Date End Date Taking? Authorizing Provider  allopurinol (ZYLOPRIM) 100 MG tablet Take 100 mg by mouth daily.  09/29/18  Yes [provider]  amLODipine (NORVASC) 5 MG tablet Take 5 mg by mouth in the morning and at bedtime.    Yes [provider]  aspirin 81 MG tablet Take 81 mg by mouth daily.   Yes [provider]  calcitRIOL  (ROCALTROL) 0.25 MCG capsule Take 0.25 mcg by mouth every Monday, Wednesday, and Friday.  10/27/17  Yes [provider]  carvedilol (COREG) 25 MG tablet Take 12.5 mg by mouth 2 (two) times daily with a meal.    Yes [provider]  COD LIVER OIL PO Take 1 capsule by mouth 2 (two) times daily.    Yes [provider]  cyanocobalamin (,VITAMIN B-12,) 1000 MCG/ML injection Vitamin B12 injection: 1000 mcg (1 mL) by subcu injection once daily for 7 days; then once weekly for 4 weeks; then once monthly thereafter.  7 07/15/2010 Patient taking differently: Inject 1,000 mcg into the muscle every 30 (thirty) days.  12/21/18  Yes Ladell Pier, MD  doxazosin (CARDURA) 2 MG tablet Take 2 mg by mouth at bedtime.    Yes [provider]  ferrous sulfate 325 (65 FE) MG tablet Take 325 mg by mouth 2 (two) times daily with a meal.    Yes [provider]  folic acid (FOLVITE) 1 MG tablet TAKE 1 TABLET BY MOUTH EVERY DAY Patient taking differently: Take 1 mg by mouth daily.  09/06/19  Yes Ladell Pier, MD  furosemide (LASIX) 80 MG tablet Take 1 tablet (80 mg total) by mouth 2 (two) times daily. 09/20/18  Yes Daleen Bo, MD  insulin NPH (HUMULIN N,NOVOLIN N) 100 UNIT/ML injection Inject 50 Units into the skin 2 (two) times daily.    Yes [provider]  insulin regular (HUMULIN R,NOVOLIN R) 100 units/mL injection Inject 20-25 Units into the skin 3 (three) times daily before meals. Sliding Scale   Yes [provider]  isosorbide mononitrate (IMDUR) 30 MG 24 hr tablet Take 30 mg by mouth daily. 11/07/17  Yes [provider]  potassium chloride (K-DUR) 10 MEQ tablet Take 10 mEq by mouth 2 (two) times daily.  11/02/17  Yes [provider]  pravastatin (PRAVACHOL) 40 MG tablet Take 40 mg by mouth at bedtime.    Yes [provider]  sevelamer carbonate (RENVELA) 800 MG tablet Take 800 mg by mouth 3 (three) times daily. 10/05/19  Yes  [provider]     Family History  Problem Relation Age of Onset  . Colon cancer Neg Hx     Social History   Socioeconomic History  . Marital status: Married    Spouse name: [name]  . Number of children: Not on file  . Years of education: Not on file  . Highest education level: Not on file  Occupational History  . Not on file  Tobacco Use  . Smoking status: Former Smoker    Types: Cigarettes    Quit date: 03/11/1996    Years since quitting: 23.6  . Smokeless tobacco: Never Used  Vaping Use  . Vaping Use: Never used  Substance and Sexual Activity  . Alcohol use: No  . Drug use: No  . Sexual activity: Not Currently  Other Topics Concern  . Not on file  Social History Narrative  .  Not on file   Social Determinants of Health   Financial Resource Strain:   . Difficulty of Paying Living Expenses: Not on file  Food Insecurity:   . Worried About Charity fundraiser in the Last Year: Not on file  . Ran Out of Food in the Last Year: Not on file  Transportation Needs:   . Lack of Transportation (Medical): Not on file  . Lack of Transportation (Non-Medical): Not on file  Physical Activity:   . Days of Exercise per Week: Not on file  . Minutes of Exercise per Session: Not on file  Stress:   . Feeling of Stress : Not on file  Social Connections:   . Frequency of Communication with Friends and Family: Not on file  . Frequency of Social Gatherings with Friends and Family: Not on file  . Attends Religious Services: Not on file  . Active Member of Clubs or Organizations: Not on file  . Attends Archivist Meetings: Not on file  . Marital Status: Not on file    Review of Systems: A 12 point ROS discussed and pertinent positives are indicated in the HPI above.  All other systems are negative.  Review of Systems  Constitutional: Positive for activity change, appetite change and fatigue. Negative for fever.  Respiratory: Positive for shortness of breath.  Negative for cough.   Cardiovascular: Negative for chest pain.  Gastrointestinal: Negative for nausea.  Neurological: Positive for weakness.  Psychiatric/Behavioral: Negative for behavioral problems and confusion.    Vital Signs: BP (!) 129/51 (BP Location: Left Arm)   Pulse 60   Temp 98 F (36.7 C) (Oral)   Resp 18   SpO2 93%   Physical Exam Vitals reviewed.  Cardiovascular:     Rate and Rhythm: Normal rate and regular rhythm.     Heart sounds: Normal heart sounds.  Pulmonary:     Effort: No respiratory distress.     Breath sounds: Wheezing present.  Abdominal:     Palpations: Abdomen is soft.  Musculoskeletal:        General: Swelling present.     Right lower leg: Edema present.     Left lower leg: Edema present.  Skin:    General: Skin is warm.  Neurological:     Mental Status: He is alert and oriented to person, place, and time.  Psychiatric:        Behavior: Behavior normal.        Thought Content: Thought content normal.        Judgment: Judgment normal.     Imaging: DG Chest 2 View  Result Date: 10/20/2019 CLINICAL DATA:  Shortness of breath, swelling in the lower extremities, awaiting dialysis EXAM: CHEST - 2 VIEW COMPARISON:  Radiograph 09/20/2018 FINDINGS: There are hazy interstitial opacities with a mid to lower lung predominance as well as central vascular congestion and cuffing with peripheral septal thickening and fissural thickening. No sizable pleural effusions. No pneumothorax. Cardiomediastinal contours are similar to prior with a top-normal cardiac size and a calcified aorta. No acute osseous or soft tissue abnormality. Atherosclerotic calcification of the bilateral axilla. Telemetry leads overlie the chest. IMPRESSION: Findings suggestive of CHF/volume overload with pulmonary edema and vascular congestion. Aortic Atherosclerosis (ICD10-I70.0). Electronically Signed   By: Lovena Le M.D.   On: 10/20/2019 23:14   DG Chest Portable 1 View  Result  Date: 11/07/2019 CLINICAL DATA:  79 year old male with renal failure. EXAM: PORTABLE CHEST 1 VIEW COMPARISON:  Chest radiograph dated  10/20/2019 FINDINGS: Cardiomegaly with vascular congestion and mild edema. No focal consolidation, pleural effusion, pneumothorax. Atherosclerotic calcification of the aorta. No acute osseous pathology. IMPRESSION: Cardiomegaly with mild CHF or fluid overload. Electronically Signed   By: Anner Crete M.D.   On: 11/07/2019 21:35   VAS US DUPLEX DIALYSIS ACCESS (AVF, AVG)  Result Date: 11/07/2019 DIALYSIS ACCESS Reason for Exam: Routine follow up. Access Site: Right Upper Extremity. Access Type: Basilic vein transposition 10/01/2019 with ligation of right              radiocephalic AVF. Performing Technologist: Ronal Fear RVS, RCS  Examination Guidelines: A complete evaluation includes B-mode imaging, spectral Doppler, color Doppler, and power Doppler as needed of all accessible portions of each vessel. Unilateral testing is considered an integral part of a complete examination. Limited examinations for reoccurring indications may be performed as noted.  Findings: +--------------------+----------+-----------------+--------+ AVF                 PSV (cm/s)Flow Vol (mL/min)Comments +--------------------+----------+-----------------+--------+ Native artery inflow   256           902                +--------------------+----------+-----------------+--------+ AVF Anastomosis        647                              +--------------------+----------+-----------------+--------+  +------------+----------+-------------+----------+--------+ OUTFLOW VEINPSV (cm/s)Diameter (cm)Depth (cm)Describe +------------+----------+-------------+----------+--------+ Prox UA        157        0.66        0.80            +------------+----------+-------------+----------+--------+ Mid UA         215        0.70        1.13             +------------+----------+-------------+----------+--------+ Dist UA        310        0.60        1.23            +------------+----------+-------------+----------+--------+ AC Fossa       665        0.52        0.95            +------------+----------+-------------+----------+--------+   Summary: 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 *See table(s) above for measurements and observations.  Diagnosing physician: Deitra Mayo MD Electronically signed by Deitra Mayo MD on 11/07/2019 at 2:53:42 PM.   --------------------------------------------------------------------------------   Final     Labs:  CBC: Recent Labs    09/24/19 0929 09/24/19 0929 10/01/19 0606 10/21/19 0023 11/07/19 1902 11/08/19 0445  WBC 29.1*  --   --  16.7* 16.7* 13.7*  HGB 11.5*  --  13.6 9.4* 8.8* 8.6*  HCT 36.4*   < > 40.0 29.5* 28.3* 27.5*  PLT 175  --   --  176 142* 132*   < > = values in this interval not displayed.    COAGS: No results for input(s): INR, APTT in the last 8760 hours.  BMP: Recent Labs    10/01/19 0606 10/21/19 0023 11/07/19 1902 11/08/19 0445  NA 137 133* 132* 134*  K 3.6 4.0 4.5 4.3  CL 95* 101 98 99  CO2  --  18* 17* 18*  GLUCOSE 128* 116* 151* 129*  BUN >130* 79* 156* 158*  CALCIUM  --  8.1* 7.6* 7.6*  CREATININE 6.80* 6.53* 8.35* 8.26*  GFRNONAA  --  7* 6* 6*    LIVER FUNCTION TESTS: Recent Labs    11/07/19 1902 11/08/19 0445  BILITOT 1.3* 1.0  AST 27 24  ALT 36 35  ALKPHOS 64 59  PROT 5.9* 5.7*  ALBUMIN 3.0* 3.0*    TUMOR MARKERS: No results for input(s): AFPTM, CEA, CA199, CHROMGRNA in the last 8760 hours.  Assessment and Plan:  CKD 5; Acute on chronic Cr 8.3 Decreased UOP; SOB Not yet mature right arm fistula Dr Blanchard Mane has requested tunneled dialysis catheter to initiate dialysis Risks and benefits discussed with the patient including, but not limited to bleeding, infection, vascular injury, pneumothorax  which may require chest tube placement, air embolism or even death  All of the patient's questions were answered, patient is agreeable to proceed. Consent signed and in chart.   Thank you for this interesting consult.  I greatly enjoyed meeting ASHELY JOSHUA and look forward to participating in their care.  A copy of this report was sent to the requesting provider on this date.  Electronically Signed: Lavonia Drafts, PA-C 11/08/2019, 8:42 AM   I spent a total of 20 Minutes    in face to face in clinical consultation, greater than 50% of which was counseling/coordinating care for tunneled HD Catheter

## 2019-11-08 NOTE — Evaluation (Signed)
Physical Therapy Evaluation Patient Details Name: Thomas Mcguire MRN: [id number] DOB: August 02, 1940 Today's Date: 11/08/2019   History of Present Illness  Pt is a 79 y/o male with PMH of CLL, anemia, CKD stage V, DM2, HTN, thrombocytopenia, presenting with increased leg swelling, fluid overload, progressive dyspnea. Admitted for fluid overload in setting of decompensated CKD stage V.   Clinical Impression   Patient received in bed, pleasant but very talkative but able to be redirected. Very fatigued and somewhat weak today, but able to tolerate in-room mobility. Needed HHA of 2 persons at first but this faded to [facility] of one person the longer he was up. RPE 5/10 just walking to the sink and back today, HR WNL, unable to check O2 due to pulse ox without pleth but no signs of desat. Left in bed with all needs met, ECHO tech attending. Feel he will likely feel much better after fluid is taken off, and would benefit from skilled OP PT services moving forward.     Follow Up Recommendations Outpatient PT;Supervision for mobility/OOB    Equipment Recommendations  Rolling walker with 5" wheels    Recommendations for Other Services       Precautions / Restrictions Precautions Precautions: Fall Restrictions Weight Bearing Restrictions: No      Mobility  Bed Mobility Overal bed mobility: Needs Assistance Bed Mobility: Supine to Sit;Sit to Supine     Supine to sit: Supervision Sit to supine: Supervision   General bed mobility comments: HOB elevated with increased time but no physical assist required    Transfers Overall transfer level: Needs assistance Equipment used: 2 person hand held assist Transfers: Sit to/from Stand Sit to Stand: +2 safety/equipment;Min assist         General transfer comment: for safety/balance, inital unsteadiness  Ambulation/Gait Ambulation/Gait assistance: Min assist Gait Distance (Feet): 20 Feet (21fx2) Assistive device: 1 person hand held assist;2  person hand held assist Gait Pattern/deviations: Step-through pattern;Trunk flexed;Wide base of support Gait velocity: decreased   General Gait Details: slow and initially required HHA of 2 persons for safety, once up for a bit after standing rest at sink, able to walk back to side of bed wtih HHA of 1 person  Stairs            Wheelchair Mobility    Modified Rankin (Stroke Patients Only)       Balance Overall balance assessment: Needs assistance Sitting-balance support: Feet supported;No upper extremity supported Sitting balance-Leahy Scale: Fair     Standing balance support: No upper extremity supported;During functional activity;Single extremity supported Standing balance-Leahy Scale: Poor Standing balance comment: relies on at least 1 UE support                             Pertinent Vitals/Pain Pain Assessment: No/denies pain    Home Living                        Prior Function                 Hand Dominance        Extremity/Trunk Assessment   Upper Extremity Assessment Upper Extremity Assessment: Defer to OT evaluation    Lower Extremity Assessment Lower Extremity Assessment: Generalized weakness    Cervical / Trunk Assessment Cervical / Trunk Assessment: Normal  Communication      Cognition Arousal/Alertness: Awake/alert Behavior During Therapy: WFL for tasks assessed/performed Overall Cognitive Status:  Within Functional Limits for tasks assessed                                 General Comments: verbose, but pleasant       General Comments General comments (skin integrity, edema, etc.): VSS, RPE 5/10 walking short distances in room; some increased WOB but no signs of desat, pulse ox not getting a signal    Exercises     Assessment/Plan    PT Assessment Patient needs continued PT services  PT Problem List Decreased strength;Decreased knowledge of use of DME;Decreased activity tolerance;Decreased  balance;Decreased mobility;Decreased safety awareness       PT Treatment Interventions DME instruction;Balance training;Gait training;Stair training;Functional mobility training;Patient/family education;Therapeutic activities;Therapeutic exercise    PT Goals (Current goals can be found in the Care Plan section)  Acute Rehab PT Goals Patient Stated Goal: to feel better PT Goal Formulation: With patient Time For Goal Achievement: 11/22/19 Potential to Achieve Goals: Good    Frequency Min 3X/week   Barriers to discharge        Co-evaluation               AM-PAC PT "6 Clicks" Mobility  Outcome Measure Help needed turning from your back to your side while in a flat bed without using bedrails?: None Help needed moving from lying on your back to sitting on the side of a flat bed without using bedrails?: A Little Help needed moving to and from a bed to a chair (including a wheelchair)?: A Little Help needed standing up from a chair using your arms (e.g., wheelchair or bedside chair)?: A Little Help needed to walk in hospital room?: A Little Help needed climbing 3-5 steps with a railing? : A Lot 6 Click Score: 18    End of Session Equipment Utilized During Treatment: Gait belt Activity Tolerance: Patient tolerated treatment well Patient left: in bed;with call bell/phone within reach;with bed alarm set;Other (comment) (in care of ECHO tech) Nurse Communication: Mobility status PT Visit Diagnosis: Unsteadiness on feet (R26.81);Difficulty in walking, not elsewhere classified (R26.2);Muscle weakness (generalized) (M62.81)    Time: 7867-6720 PT Time Calculation (min) (ACUTE ONLY): 19 min   Charges:   PT Evaluation $PT Eval Moderate Complexity: 1 Mod          Windell Norfolk, DPT, PN1   Supplemental Physical Therapist [place]    Pager [phone] Acute Rehab Office [phone]

## 2019-11-08 NOTE — Progress Notes (Signed)
PROGRESS NOTE  Thomas Mcguire GBT:[id number] DOB: 11/19/1940   PCP: Leanna Battles, MD  Patient is from: Home  DOA: 11/07/2019 LOS: 1  Chief complaints: Leg swelling  Brief Narrative / Interim history: 79 year old M with history of CLL, CKD-5, DM-2, thrombocytopenia, anemia and HTN directed to ED from vascular surgery office with lower extremity edema and progressive dyspnea on exertion.  He is followed by Dr. Carolin Sicks at [facility].  He was at vascular surgeons office for assessment of his fistula.  In ED, hemodynamically stable.  99% on RA. Na 32. Cr 8.35. BUN 156. Bicarb 17. Ag 17.  WBC 16.7 with lymphocytosis (16.7 on 10/10).  Hgb 8.8 (9.4 on 10/10).  Platelet 142 (slightly lower than baseline).  Influenza and COVID-19 PCR negative.  CXR cardiomegaly with mild CHF.  Nephrology and IR consulted, and admitted for fluid overload and AKI on CKD-5.   Subjective: Seen and examined earlier this morning.  No major events overnight of this morning.  Endorses bilateral lower extremity edema but denies orthopnea.  Denies chest pain, dyspnea, GI or UTI symptoms.  Objective: Vitals:   11/07/19 2345 11/08/19 0134 11/08/19 0421 11/08/19 1152  BP: (!) 132/52 (!) 126/48 (!) 129/51 (!) 112/42  Pulse: (!) 59 (!) 59 60 63  Resp: 14 16 18 18   Temp:  98.1 F (36.7 C) 98 F (36.7 C) 98.7 F (37.1 C)  TempSrc:  Oral Oral Oral  SpO2: 97% 96% 93% 95%    Intake/Output Summary (Last 24 hours) at 11/08/2019 1431 Last data filed at 11/08/2019 1200 Gross per 24 hour  Intake --  Output 200 ml  Net -200 ml   There were no vitals filed for this visit.  Examination:  GENERAL: No apparent distress.  Nontoxic. HEENT: MMM.  Vision and hearing grossly intact.  NECK: Supple.  No apparent JVD.  RESP:  No IWOB.  Fair aeration bilaterally. CVS:  RRR. Heart sounds normal.  ABD/GI/GU: BS+. Abd soft, NTND.  aVF over RUE with good bruits. MSK/EXT:  Moves extremities. No apparent deformity.  2+ pitting edema  bilaterally. SKIN: no apparent skin lesion or wound NEURO: Awake, alert and oriented appropriately.  No apparent focal neuro deficit. PSYCH: Calm. Normal affect.   Procedures:  None  Microbiology summarized: COVID-19 PCR negative. Influenza PCR negative.  Assessment & Plan: AKI on CKD-5 with azotemia: Cr 8.35.  BUN 156.  Has been on p.o. Lasix at home without significant urine output.  No signs of uremia.  Normal mental status. -Plan for HD after HD cath placement. -Nephrology and IR following.  Fluid overload: Significant bilateral lower extremity edema, cardiomegaly, pulmonary congestion and DOE.  Saturating well on room air. -HD per nephrology after HD cath placement -Follow echocardiogram.  Anion gap metabolic acidosis: Likely due to azotemia. -Per nephrology.  CLL/leukocytosis/ymphocytosis: Relatively stable. -Outpatient follow-up with hematology.  Controlled DM-2: A1c 6.8% on 07/11/2018. Recent Labs  Lab 11/07/19 2350 11/08/19 0419 11/08/19 0757 11/08/19 1226  GLUCAP 166* 122* 111* 119*  -Continue Novolin and 25 units before every meal -Continue SSI-renal -Monitor CBG. -Check hemoglobin A1c  Microcytic anemia/folic acid deficiency/anemia of renal disease -Monitor H&H -IV iron and Aranesp per nephrology. -Continue folic acid supplementation  Essential hypertension: Soft blood pressures of home BP medications. -Continue holding home BP meds   Chronic mild thrombocytopenia: Stable. -Monitor   There is no height or weight on file to calculate BMI.         DVT prophylaxis:  SCDs Start: 11/08/19 0022  Code  Status: Full code Family Communication: Patient and/or RN. Available if any question.  Status is: Inpatient  Remains inpatient appropriate because:Hemodynamically unstable, Unsafe d/c plan and Inpatient level of care appropriate due to severity of illness   Dispo: The patient is from: Home              Anticipated d/c is to: Home               Anticipated d/c date is: 3 days              Patient currently is not medically stable to d/c.       Consultants:  Nephrology IR   Sch Meds:  Scheduled Meds:  [START ON 11/09/2019] calcitRIOL  0.25 mcg Oral Q M,W,F   Chlorhexidine Gluconate Cloth  6 each Topical Q0600   [START ON 11/15/2019] darbepoetin (ARANESP) injection - DIALYSIS  100 mcg Intravenous Q Thu-HD   insulin aspart  0-6 Units Subcutaneous Q4H   insulin NPH Human  25 Units Subcutaneous QAC breakfast   pravastatin  40 mg Oral QHS   sevelamer carbonate  800 mg Oral TID WC   sodium chloride flush  3 mL Intravenous Q12H   Continuous Infusions:  sodium chloride      ceFAZolin (ANCEF) IV     PRN Meds:.sodium chloride, acetaminophen **OR** acetaminophen, HYDROcodone-acetaminophen, ondansetron **OR** ondansetron (ZOFRAN) IV, sodium chloride flush  Antimicrobials: Anti-infectives (From admission, onward)   Start     Dose/Rate Route Frequency Ordered Stop   11/08/19 0945  ceFAZolin (ANCEF) IVPB 2g/100 mL premix        2 g 200 mL/hr over 30 Minutes Intravenous To Radiology 11/08/19 0855 11/09/19 0945       I have personally reviewed the following labs and images: CBC: Recent Labs  Lab 11/07/19 1902 11/08/19 0445  WBC 16.7* 13.7*  NEUTROABS 6.0 5.0  HGB 8.8* 8.6*  HCT 28.3* 27.5*  MCV 86.8 84.4  PLT 142* 132*   BMP &GFR Recent Labs  Lab 11/07/19 1902 11/08/19 0445  NA 132* 134*  K 4.5 4.3  CL 98 99  CO2 17* 18*  GLUCOSE 151* 129*  BUN 156* 158*  CREATININE 8.35* 8.26*  CALCIUM 7.6* 7.6*  MG  --  2.4  PHOS  --  6.2*   Estimated Creatinine Clearance: 9.7 mL/min (A) (by C-G formula based on SCr of 8.26 mg/dL (H)). Liver & Pancreas: Recent Labs  Lab 11/07/19 1902 11/08/19 0445  AST 27 24  ALT 36 35  ALKPHOS 64 59  BILITOT 1.3* 1.0  PROT 5.9* 5.7*  ALBUMIN 3.0* 3.0*   No results for input(s): LIPASE, AMYLASE in the last 168 hours. No results for input(s): AMMONIA in the last 168  hours. Diabetic: No results for input(s): HGBA1C in the last 72 hours. Recent Labs  Lab 11/07/19 2350 11/08/19 0419 11/08/19 0757 11/08/19 1226  GLUCAP 166* 122* 111* 119*   Cardiac Enzymes: No results for input(s): CKTOTAL, CKMB, CKMBINDEX, TROPONINI in the last 168 hours. No results for input(s): PROBNP in the last 8760 hours. Coagulation Profile: Recent Labs  Lab 11/08/19 0759  INR 1.4*   Thyroid Function Tests: Recent Labs    11/08/19 0445  TSH 1.517   Lipid Profile: No results for input(s): CHOL, HDL, LDLCALC, TRIG, CHOLHDL, LDLDIRECT in the last 72 hours. Anemia Panel: Recent Labs    11/08/19 0445  VITAMINB12 2,893*  FOLATE 65.7  FERRITIN 270  TIBC 232*  IRON 28*  RETICCTPCT 2.3  Urine analysis:    Component Value Date/Time   COLORURINE YELLOW 07/11/2018 0802   APPEARANCEUR CLEAR 07/11/2018 0802   LABSPEC 1.009 07/11/2018 0802   PHURINE 6.0 07/11/2018 0802   GLUCOSEU NEGATIVE 07/11/2018 0802   HGBUR NEGATIVE 07/11/2018 0802   BILIRUBINUR NEGATIVE 07/11/2018 0802   KETONESUR NEGATIVE 07/11/2018 0802   PROTEINUR 100 (A) 07/11/2018 0802   NITRITE NEGATIVE 07/11/2018 0802   LEUKOCYTESUR NEGATIVE 07/11/2018 0802   Sepsis Labs: Invalid input(s): PROCALCITONIN, [place]  Microbiology: Recent Results (from the past 240 hour(s))  Respiratory Panel by RT PCR (Flu A&B, Covid) - Nasopharyngeal Swab     Status: None   Collection Time: 11/07/19  9:06 PM   Specimen: Nasopharyngeal Swab  Result Value Ref Range Status   SARS Coronavirus 2 by RT PCR NEGATIVE NEGATIVE Final    Comment: (NOTE) SARS-CoV-2 target nucleic acids are NOT DETECTED.  The SARS-CoV-2 RNA is generally detectable in upper respiratoy specimens during the acute phase of infection. The lowest concentration of SARS-CoV-2 viral copies this assay can detect is 131 copies/mL. A negative result does not preclude SARS-Cov-2 infection and should not be used as the sole basis for treatment  or other patient management decisions. A negative result may occur with  improper specimen collection/handling, submission of specimen other than nasopharyngeal swab, presence of viral mutation(s) within the areas targeted by this assay, and inadequate number of viral copies (<131 copies/mL). A negative result must be combined with clinical observations, patient history, and epidemiological information. The expected result is Negative.  Fact Sheet for Patients:  PinkCheek.be  Fact Sheet for Healthcare Providers:  GravelBags.it  This test is no t yet approved or cleared by the [place] FDA and  has been authorized for detection and/or diagnosis of SARS-CoV-2 by FDA under an Emergency Use Authorization (EUA). This EUA will remain  in effect (meaning this test can be used) for the duration of the COVID-19 declaration under Section 564(b)(1) of the Act, 21 U.S.C. section 360bbb-3(b)(1), unless the authorization is terminated or revoked sooner.     Influenza A by PCR NEGATIVE NEGATIVE Final   Influenza B by PCR NEGATIVE NEGATIVE Final    Comment: (NOTE) The Xpert Xpress SARS-CoV-2/FLU/RSV assay is intended as an aid in  the diagnosis of influenza from Nasopharyngeal swab specimens and  should not be used as a sole basis for treatment. Nasal washings and  aspirates are unacceptable for Xpert Xpress SARS-CoV-2/FLU/RSV  testing.  Fact Sheet for Patients: PinkCheek.be  Fact Sheet for Healthcare Providers: GravelBags.it  This test is not yet approved or cleared by the [place] FDA and  has been authorized for detection and/or diagnosis of SARS-CoV-2 by  FDA under an Emergency Use Authorization (EUA). This EUA will remain  in effect (meaning this test can be used) for the duration of the  Covid-19 declaration under Section 564(b)(1) of the Act, 21  U.S.C.  section 360bbb-3(b)(1), unless the authorization is  terminated or revoked. Performed at [facility], [place] [address] [place], [place] [address]     Radiology Studies: DG Chest Portable 1 View  Result Date: 11/07/2019 CLINICAL DATA:  79 year old male with renal failure. EXAM: PORTABLE CHEST 1 VIEW COMPARISON:  Chest radiograph dated 10/20/2019 FINDINGS: Cardiomegaly with vascular congestion and mild edema. No focal consolidation, pleural effusion, pneumothorax. Atherosclerotic calcification of the aorta. No acute osseous pathology. IMPRESSION: Cardiomegaly with mild CHF or fluid overload. Electronically Signed   By: Anner Crete M.D.   On: 11/07/2019 21:35  VAS US DUPLEX DIALYSIS ACCESS (AVF, AVG)  Result Date: 11/07/2019 DIALYSIS ACCESS Reason for Exam: Routine follow up. Access Site: Right Upper Extremity. Access Type: Basilic vein transposition 10/01/2019 with ligation of right              radiocephalic AVF. Performing Technologist: Ronal Fear RVS, RCS  Examination Guidelines: A complete evaluation includes B-mode imaging, spectral Doppler, color Doppler, and power Doppler as needed of all accessible portions of each vessel. Unilateral testing is considered an integral part of a complete examination. Limited examinations for reoccurring indications may be performed as noted.  Findings: +--------------------+----------+-----------------+--------+  AVF                  PSV (cm/s) Flow Vol (mL/min) Comments  +--------------------+----------+-----------------+--------+  Native artery inflow    256            902                  +--------------------+----------+-----------------+--------+  AVF Anastomosis         647                                 +--------------------+----------+-----------------+--------+  +------------+----------+-------------+----------+--------+  OUTFLOW VEIN PSV (cm/s) Diameter (cm) Depth (cm) Describe   +------------+----------+-------------+----------+--------+  Prox UA         157         0.66         0.80              +------------+----------+-------------+----------+--------+  Mid UA          215         0.70         1.13              +------------+----------+-------------+----------+--------+  Dist UA         310         0.60         1.23              +------------+----------+-------------+----------+--------+  AC Fossa        665         0.52         0.95              +------------+----------+-------------+----------+--------+   Summary: 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 *See table(s) above for measurements and observations.  Diagnosing physician: Deitra Mayo MD Electronically signed by Deitra Mayo MD on 11/07/2019 at 2:53:42 PM.   --------------------------------------------------------------------------------   Final      Iridian Reader T. [place]  If 7PM-7AM, please contact night-coverage [email or web address] 11/08/2019, 2:31 PM

## 2019-11-08 NOTE — Evaluation (Signed)
Occupational Therapy Evaluation Patient Details Name: Thomas Mcguire MRN: [id number] DOB: 1940/08/10 Today's Date: 11/08/2019    History of Present Illness Pt is a 79 y/o male with PMH of CLL, anemia, CKD stage V, DM2, HTN, thrombocytopenia, presenting with increased leg swelling, fluid overload, progressive dyspnea. Admitted for fluid overload in setting of decompensated CKD stage V.    Clinical Impression   PTA patient reports using cane for mobility and completing ADLs with independence, limited IADLs recently. Admitted for above and limited by problem list below, including impaired balance, decreased activity tolerance, generalized weakness, and B LE edema.  Patient currently requires min assist +2 safety for transfers, min guard for mobility, and min assist to supervision for ADLs.  Patient fatigues easily, reports RPE 5/10 after mobility to/from sink.  Patient will benefit from continued OT services while admitted to optimize independence and safety, activity tolerance with ADLs/mobility but anticipate no further needs after dc home.     Follow Up Recommendations  No OT follow up;Supervision - Intermittent    Equipment Recommendations  3 in 1 bedside commode    Recommendations for Other Services       Precautions / Restrictions Precautions Precautions: Fall Restrictions Weight Bearing Restrictions: No      Mobility Bed Mobility Overal bed mobility: Needs Assistance Bed Mobility: Supine to Sit;Sit to Supine     Supine to sit: Supervision Sit to supine: Supervision   General bed mobility comments: HOB elevated with increased time but no physical assist required    Transfers Overall transfer level: Needs assistance Equipment used: 2 person hand held assist Transfers: Sit to/from Stand Sit to Stand: +2 safety/equipment;Min assist         General transfer comment: for safety/balance, inital unsteadiness    Balance Overall balance assessment: Needs  assistance Sitting-balance support: Feet supported;No upper extremity supported Sitting balance-Leahy Scale: Fair     Standing balance support: No upper extremity supported;During functional activity;Single extremity supported Standing balance-Leahy Scale: Poor Standing balance comment: relies on at least 1 UE support                           ADL either performed or assessed with clinical judgement   ADL Overall ADL's : Needs assistance/impaired     Grooming: Min guard;Standing   Upper Body Bathing: Sitting;Set up   Lower Body Bathing: Minimal assistance;Sit to/from stand   Upper Body Dressing : Minimal assistance;Sitting Upper Body Dressing Details (indicate cue type and reason): to don 2nd gown   Lower Body Dressing: Minimal assistance;Sit to/from stand Lower Body Dressing Details (indicate cue type and reason): increased SOB donning L sock EOB, assist required with R sock  Toilet Transfer: Ambulation;Minimal assistance Toilet Transfer Details (indicate cue type and reason): simulated, 2 person HHA fading to 1 person HHA          Functional mobility during ADLs: Min guard General ADL Comments: pt limited by decreased activity tolerance and endurance, SOB with minimal activity      Vision         Perception     Praxis      Pertinent Vitals/Pain Pain Assessment: No/denies pain     Hand Dominance Right   Extremity/Trunk Assessment Upper Extremity Assessment Upper Extremity Assessment: Overall WFL for tasks assessed   Lower Extremity Assessment Lower Extremity Assessment: Defer to PT evaluation       Communication Communication Communication: No difficulties   Cognition Arousal/Alertness: Awake/alert Behavior During  Therapy: WFL for tasks assessed/performed Overall Cognitive Status: Within Functional Limits for tasks assessed                                 General Comments: verbose, but pleasant    General Comments  VSS  during session, RPE 5/10 with minimal activity (walking to sink to wash hands)    Exercises     Shoulder Instructions      Home Living Family/patient expects to be discharged to:: Private residence Living Arrangements: Spouse/significant other Available Help at Discharge: Family;Available 24 hours/day Type of Home: House Home Access: Stairs to enter [employer] of Steps: 2 Entrance Stairs-Rails: Right;Left Home Layout: One level     Bathroom Shower/Tub: Occupational psychologist: Standard     Home Equipment: Environmental consultant - 2 wheels;Shower seat;Cane - single point          Prior Functioning/Environment Level of Independence: Independent with assistive device(s)        Comments: has been using cane recently, independent ADLs, limited driving and IADLs          OT Problem List: Decreased strength;Decreased activity tolerance;Impaired balance (sitting and/or standing);Increased edema;Cardiopulmonary status limiting activity;Decreased knowledge of precautions;Decreased knowledge of use of DME or AE;Decreased safety awareness      OT Treatment/Interventions: Self-care/ADL training;DME and/or AE instruction;Therapeutic activities;Balance training;Patient/family education;Energy conservation    OT Goals(Current goals can be found in the care plan section) Acute Rehab OT Goals Patient Stated Goal: to feel better OT Goal Formulation: With patient Time For Goal Achievement: 11/22/19 Potential to Achieve Goals: Good  OT Frequency: Min 2X/week   Barriers to D/C:            Co-evaluation PT/OT/SLP Co-Evaluation/Treatment: Yes Reason for Co-Treatment: Other (comment) (activity tolerance)   OT goals addressed during session: ADL's and self-care      AM-PAC OT "6 Clicks" Daily Activity     Outcome Measure Help from another person eating meals?: Total Help from another person taking care of personal grooming?: A Little (NPO) Help from another person  toileting, which includes using toliet, bedpan, or urinal?: A Little Help from another person bathing (including washing, rinsing, drying)?: A Little Help from another person to put on and taking off regular upper body clothing?: A Little Help from another person to put on and taking off regular lower body clothing?: A Little 6 Click Score: 16   End of Session Equipment Utilized During Treatment: Gait belt Nurse Communication: Mobility status  Activity Tolerance: Patient tolerated treatment well Patient left: in bed;with call bell/phone within reach;with bed alarm set;Other (comment) (ECHO in room)  OT Visit Diagnosis: Other abnormalities of gait and mobility (R26.89);Muscle weakness (generalized) (M62.81)                Time: 3818-2993 OT Time Calculation (min): 18 min Charges:  OT General Charges $OT Visit: 1 Visit OT Evaluation $OT Eval Moderate Complexity: 1 Mod  Jolaine Artist, OT Acute Rehabilitation Services Pager [phone] Office [phone]   [name] 11/08/2019, 10:00 AM

## 2019-11-08 NOTE — Consult Note (Signed)
[facility] Renal Consultation Note  Requesting MD: Cyndia Skeeters Indication for Consultation: ESRD, need to start HD  HPI:  Thomas Mcguire is a 79 y.o. male with past medical history significant for DM, HTN, CLL and also progressive CKD followed by Dr. Carolin Sicks at [facility].  He has been approaching the need for dialysis.  Underwent first stage of BVT on 9/20 per Dr. Scot Dock.  He was seeming to be having more trouble with volume overload, LE edema and DOE so was essentially directed to the hospital yesterday to go ahead and initiate dialysis therapy.  Pt feels poorly enough that he consents to dialysis at this time.  Labs of note, BUN 156, Co2 17, albumin 3.0 and hgb 8.8.  I have ordered thru IR for him to get [facility] today followed by his first HD.  [facility] is aware that patient is in house.  He needs a second stage procedure for his BVT and may get it while here.    Creatinine  Date/Time Value Ref Range Status  12/28/2017 10:09 AM 5.72 (HH) 0.61 - 1.24 mg/dL Final    Comment:    CRITICAL RESULT CALLED TO, READ BACK BY AND VERIFIED WITH: RN Clemmie Krill    11/30/2017 01:18 PM 5.38 (HH) 0.61 - 1.24 mg/dL Final    Comment:    CRITICAL RESULT CALLED TO, READ BACK BY AND VERIFIED WITH: ROZ WINSTON,RN    Creatinine, Ser  Date/Time Value Ref Range Status  11/08/2019 04:45 AM 8.26 (H) 0.61 - 1.24 mg/dL Final  11/07/2019 07:02 PM 8.35 (H) 0.61 - 1.24 mg/dL Final  10/21/2019 12:23 AM 6.53 (H) 0.61 - 1.24 mg/dL Final  10/01/2019 06:06 AM 6.80 (H) 0.61 - 1.24 mg/dL Final  09/07/2019 08:12 AM 6.00 (H) 0.61 - 1.24 mg/dL Final  09/20/2018 10:13 AM 4.88 (H) 0.61 - 1.24 mg/dL Final  08/11/2018 07:46 AM 3.70 (H) 0.61 - 1.24 mg/dL Final  07/12/2018 04:44 AM 4.48 (H) 0.61 - 1.24 mg/dL Final  07/11/2018 07:58 AM 4.33 (H) 0.61 - 1.24 mg/dL Final  07/03/2018 07:43 AM 6.30 (H) 0.61 - 1.24 mg/dL Final  11/02/2017 09:33 AM 3.98 (HH) 0.61 - 1.24 mg/dL Final    Comment:    REPEATED TO VERIFY CRITICAL RESULT  CALLED TO, READ BACK BY AND VERIFIED WITH: DR. RUBEN@1028  L GALLOWAY MT    10/14/2017 10:37 AM 3.77 (HH) 0.61 - 1.24 mg/dL Final    Comment:    CRITICAL RESULT CALLED TO, READ BACK BY AND VERIFIED WITH: Dr. Ladona Ridgel at 1146. RQ   08/24/2011 09:44 AM 1.66 (H) 0.50 - 1.35 mg/dL Final  06/08/2007 07:44 AM 1.41  Final     PMHx:   Past Medical History:  Diagnosis Date  . Anemia    low iron  . Arthritis   . Chronic renal insufficiency    CHECKED Q3MOS PER PT. Stage 4 as of 05/22/2018 per pt.  . Diabetes mellitus    type 2  . ESRD (end stage renal disease) ([place])   . Gout    Has not had recently- 08/24/11  . Hyperlipidemia   . Hypertension   . 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 PAD (peripheral artery disease) ([place])   . Wears dentures     Past Surgical History:  Procedure Laterality Date  . A/V FISTULAGRAM N/A 08/11/2018   Procedure: A/V FISTULAGRAM - Right Arm;  Surgeon: Angelia Mould, MD;  Location: [place] [facility];  Service: Cardiovascular;  Laterality:  N/A;  . A/V FISTULAGRAM N/A 09/07/2019   Procedure: A/V FISTULAGRAM - Right AVF;  Surgeon: Angelia Mould, MD;  Location: [place] [facility];  Service: Cardiovascular;  Laterality: N/A;  . ABDOMINAL AORTOGRAM W/LOWER EXTREMITY Right 07/03/2018   Procedure: ABDOMINAL AORTOGRAM W/LOWER EXTREMITY;  Surgeon: Waynetta Sandy, MD;  Location: [place] [facility];  Service: Cardiovascular;  Laterality: Right;  . AMPUTATION  08/27/2011   Procedure: AMPUTATION DIGIT;  Surgeon: Newt Minion, MD;  Location: [place];  Service: Orthopedics;  Laterality: Left;  Left Foot 3rd toe Amputation MTP joint  . AMPUTATION Right 07/11/2018   Procedure: AMPUTATION RIGHT GREAT TOE;  Surgeon: Angelia Mould, MD;  Location: [place];  Service: Vascular;  Laterality: Right;  . amputation great toe  2009   left  . AV FISTULA PLACEMENT Right 05/23/2018   Procedure: RADIAL- CEPHALIC ARTERIOVENOUS (AV)  FISTULA CREATION RIGHT ARM AND LIGATION OF COMPETING BRANCH.;  Surgeon: Angelia Mould, MD;  Location: [place];  Service: Vascular;  Laterality: Right;  . BASCILIC VEIN TRANSPOSITION Right 10/01/2019   Procedure: FIRST STAGE BASCILIC VEIN TRANSPOSITION;  Surgeon: Angelia Mould, MD;  Location: [place];  Service: Vascular;  Laterality: Right;  . COLONOSCOPY    . cyst elbow  1999   right  . FEMORAL-POPLITEAL BYPASS GRAFT Right 07/11/2018   Procedure: BYPASS GRAFT FEMORAL-POPLITEAL ARTERY;  Surgeon: Angelia Mould, MD;  Location: [place];  Service: Vascular;  Laterality: Right;  . FOOT SURGERY     both feet for something that was torn  . LIGATION OF ARTERIOVENOUS  FISTULA Right 10/01/2019   Procedure: LIGATION OF RIGHT RADIOCEPHALIC  ARTERIOVENOUS  FISTULA;  Surgeon: Angelia Mould, MD;  Location: [place];  Service: Vascular;  Laterality: Right;  . TOE AMPUTATION  2010   left 2nd toe  . UPPER GI ENDOSCOPY      Family Hx:  Family History  Problem Relation Age of Onset  . Colon cancer Neg Hx     Social History:  reports that he quit smoking about 23 years ago. His smoking use included cigarettes. He has never used smokeless tobacco. He reports that he does not drink alcohol and does not use drugs.  Allergies: No Known Allergies  Medications: Prior to Admission medications   Medication Sig Start Date End Date Taking? Authorizing Provider  allopurinol (ZYLOPRIM) 100 MG tablet Take 100 mg by mouth daily.  09/29/18  Yes [provider]  amLODipine (NORVASC) 5 MG tablet Take 5 mg by mouth in the morning and at bedtime.    Yes [provider]  aspirin 81 MG tablet Take 81 mg by mouth daily.   Yes [provider]  calcitRIOL (ROCALTROL) 0.25 MCG capsule Take 0.25 mcg by mouth every Monday, Wednesday, and Friday.  10/27/17  Yes [provider]  carvedilol (COREG) 25 MG tablet Take 12.5 mg by mouth 2 (two) times daily with a meal.    Yes  [provider]  COD LIVER OIL PO Take 1 capsule by mouth 2 (two) times daily.    Yes [provider]  cyanocobalamin (,VITAMIN B-12,) 1000 MCG/ML injection Vitamin B12 injection: 1000 mcg (1 mL) by subcu injection once daily for 7 days; then once weekly for 4 weeks; then once monthly thereafter.  7 07/15/2010 Patient taking differently: Inject 1,000 mcg into the muscle every 30 (thirty) days.  12/21/18  Yes Ladell Pier, MD  doxazosin (CARDURA) 2 MG tablet Take 2 mg by  mouth at bedtime.    Yes [provider]  ferrous sulfate 325 (65 FE) MG tablet Take 325 mg by mouth 2 (two) times daily with a meal.    Yes [provider]  folic acid (FOLVITE) 1 MG tablet TAKE 1 TABLET BY MOUTH EVERY DAY Patient taking differently: Take 1 mg by mouth daily.  09/06/19  Yes Ladell Pier, MD  furosemide (LASIX) 80 MG tablet Take 1 tablet (80 mg total) by mouth 2 (two) times daily. 09/20/18  Yes Daleen Bo, MD  insulin NPH (HUMULIN N,NOVOLIN N) 100 UNIT/ML injection Inject 50 Units into the skin 2 (two) times daily.    Yes [provider]  insulin regular (HUMULIN R,NOVOLIN R) 100 units/mL injection Inject 20-25 Units into the skin 3 (three) times daily before meals. Sliding Scale   Yes [provider]  isosorbide mononitrate (IMDUR) 30 MG 24 hr tablet Take 30 mg by mouth daily. 11/07/17  Yes [provider]  potassium chloride (K-DUR) 10 MEQ tablet Take 10 mEq by mouth 2 (two) times daily.  11/02/17  Yes [provider]  pravastatin (PRAVACHOL) 40 MG tablet Take 40 mg by mouth at bedtime.    Yes [provider]  sevelamer carbonate (RENVELA) 800 MG tablet Take 800 mg by mouth 3 (three) times daily. 10/05/19  Yes [provider]    I have reviewed the patient's current medications.  Labs:  Results for orders placed or performed during the hospital encounter of 11/07/19 (from the past 48 hour(s))  CBC with  Differential/Platelet     Status: Abnormal   Collection Time: 11/07/19  7:02 PM  Result Value Ref Range   WBC 16.7 (H) 4.0 - 10.5 K/uL    Comment: WHITE COUNT CONFIRMED ON SMEAR   RBC 3.26 (L) 4.22 - 5.81 MIL/uL   Hemoglobin 8.8 (L) 13.0 - 17.0 g/dL   HCT 28.3 (L) 39 - 52 %   MCV 86.8 80.0 - 100.0 fL   MCH 27.0 26.0 - 34.0 pg   MCHC 31.1 30.0 - 36.0 g/dL   RDW 17.2 (H) 11.5 - 15.5 %   Platelets 142 (L) 150 - 400 K/uL   nRBC 0.0 0.0 - 0.2 %   Neutrophils Relative % 36 %   Neutro Abs 6.0 1.7 - 7.7 K/uL   Lymphocytes Relative 61 %   Lymphs Abs 10.2 (H) 0.7 - 4.0 K/uL   Monocytes Relative 1 %   Monocytes Absolute 0.2 0.1 - 1.0 K/uL   Eosinophils Relative 0 %   Eosinophils Absolute 0.0 0.0 - 0.5 K/uL   Basophils Relative 2 %   Basophils Absolute 0.3 (H) 0.0 - 0.1 K/uL   nRBC 0 0 /100 WBC   Abs Immature Granulocytes 0.00 0.00 - 0.07 K/uL    Comment: Performed at [facility], [address], [place], [place] [address]  Comprehensive metabolic panel     Status: Abnormal   Collection Time: 11/07/19  7:02 PM  Result Value Ref Range   Sodium 132 (L) 135 - 145 mmol/L   Potassium 4.5 3.5 - 5.1 mmol/L   Chloride 98 98 - 111 mmol/L   CO2 17 (L) 22 - 32 mmol/L   Glucose, Bld 151 (H) 70 - 99 mg/dL    Comment: Glucose reference range applies only to samples taken after fasting for at least 8 hours.   BUN 156 (H) 8 - 23 mg/dL   Creatinine, Ser 8.35 (H) 0.61 - 1.24 mg/dL  Calcium 7.6 (L) 8.9 - 10.3 mg/dL   Total Protein 5.9 (L) 6.5 - 8.1 g/dL   Albumin 3.0 (L) 3.5 - 5.0 g/dL   AST 27 15 - 41 U/L   ALT 36 0 - 44 U/L   Alkaline Phosphatase 64 38 - 126 U/L   Total Bilirubin 1.3 (H) 0.3 - 1.2 mg/dL   GFR, Estimated 6 (L) >60 mL/min    Comment: (NOTE) Calculated using the CKD-EPI Creatinine Equation (2021)    Anion gap 17 (H) 5 - 15    Comment: Performed at [facility] [address], [place], [place] [address]  Respiratory Panel by RT PCR (Flu A&B, Covid) - Nasopharyngeal  Swab     Status: None   Collection Time: 11/07/19  9:06 PM   Specimen: Nasopharyngeal Swab  Result Value Ref Range   SARS Coronavirus 2 by RT PCR NEGATIVE NEGATIVE    Comment: (NOTE) SARS-CoV-2 target nucleic acids are NOT DETECTED.  The SARS-CoV-2 RNA is generally detectable in upper respiratoy specimens during the acute phase of infection. The lowest concentration of SARS-CoV-2 viral copies this assay can detect is 131 copies/mL. A negative result does not preclude SARS-Cov-2 infection and should not be used as the sole basis for treatment or other patient management decisions. A negative result may occur with  improper specimen collection/handling, submission of specimen other than nasopharyngeal swab, presence of viral mutation(s) within the areas targeted by this assay, and inadequate number of viral copies (<131 copies/mL). A negative result must be combined with clinical observations, patient history, and epidemiological information. The expected result is Negative.  Fact Sheet for Patients:  PinkCheek.be  Fact Sheet for Healthcare Providers:  GravelBags.it  This test is no t yet approved or cleared by the [place] FDA and  has been authorized for detection and/or diagnosis of SARS-CoV-2 by FDA under an Emergency Use Authorization (EUA). This EUA will remain  in effect (meaning this test can be used) for the duration of the COVID-19 declaration under Section 564(b)(1) of the Act, 21 U.S.C. section 360bbb-3(b)(1), unless the authorization is terminated or revoked sooner.     Influenza A by PCR NEGATIVE NEGATIVE   Influenza B by PCR NEGATIVE NEGATIVE    Comment: (NOTE) The Xpert Xpress SARS-CoV-2/FLU/RSV assay is intended as an aid in  the diagnosis of influenza from Nasopharyngeal swab specimens and  should not be used as a sole basis for treatment. Nasal washings and  aspirates are unacceptable for Xpert  Xpress SARS-CoV-2/FLU/RSV  testing.  Fact Sheet for Patients: PinkCheek.be  Fact Sheet for Healthcare Providers: GravelBags.it  This test is not yet approved or cleared by the [place] FDA and  has been authorized for detection and/or diagnosis of SARS-CoV-2 by  FDA under an Emergency Use Authorization (EUA). This EUA will remain  in effect (meaning this test can be used) for the duration of the  Covid-19 declaration under Section 564(b)(1) of the Act, 21  U.S.C. section 360bbb-3(b)(1), unless the authorization is  terminated or revoked. Performed at [facility], [place] [address] [place], [place] [address]   CBG monitoring, ED     Status: Abnormal   Collection Time: 11/07/19 11:50 PM  Result Value Ref Range   Glucose-Capillary 166 (H) 70 - 99 mg/dL    Comment: Glucose reference range applies only to samples taken after fasting for at least 8 hours.  Glucose, capillary     Status: Abnormal   Collection Time: 11/08/19  4:19  AM  Result Value Ref Range   Glucose-Capillary 122 (H) 70 - 99 mg/dL    Comment: Glucose reference range applies only to samples taken after fasting for at least 8 hours.  Vitamin B12     Status: Abnormal   Collection Time: 11/08/19  4:45 AM  Result Value Ref Range   Vitamin B-12 2,893 (H) 180 - 914 pg/mL    Comment: (NOTE) This assay is not validated for testing neonatal or myeloproliferative syndrome specimens for Vitamin B12 levels. Performed at [facility], [place] [address] [place], [place] [address]   Folate     Status: None   Collection Time: 11/08/19  4:45 AM  Result Value Ref Range   Folate 65.7 >5.9 ng/mL    Comment: RESULTS CONFIRMED BY MANUAL DILUTION Performed at [facility], [place] [address] [place], [place] [address]   Iron and TIBC     Status: Abnormal   Collection Time: 11/08/19  4:45 AM  Result Value Ref Range   Iron 28 (L) 45 - 182 ug/dL   TIBC 232 (L)  250 - 450 ug/dL   Saturation Ratios 12 (L) 17.9 - 39.5 %   UIBC 204 ug/dL    Comment: Performed at [facility], [place] [address], [place], [place] [address]  Ferritin     Status: None   Collection Time: 11/08/19  4:45 AM  Result Value Ref Range   Ferritin 270 24 - 336 ng/mL    Comment: Performed at [facility] [address] [place], [place] [address]  Reticulocytes     Status: Abnormal   Collection Time: 11/08/19  4:45 AM  Result Value Ref Range   Retic Ct Pct 2.3 0.4 - 3.1 %   RBC. 3.21 (L) 4.22 - 5.81 MIL/uL   Retic Count, Absolute 73.2 19.0 - 186.0 K/uL   Immature Retic Fract 20.7 (H) 2.3 - 15.9 %    Comment: Performed at [facility] [address], [place], [place] [address]  Magnesium     Status: None   Collection Time: 11/08/19  4:45 AM  Result Value Ref Range   Magnesium 2.4 1.7 - 2.4 mg/dL    Comment: Performed at [facility] [address] [place], [place] [address]  Phosphorus     Status: Abnormal   Collection Time: 11/08/19  4:45 AM  Result Value Ref Range   Phosphorus 6.2 (H) 2.5 - 4.6 mg/dL    Comment: Performed at [facility] [address], [place], [place] [address]  CBC WITH DIFFERENTIAL     Status: Abnormal   Collection Time: 11/08/19  4:45 AM  Result Value Ref Range   WBC 13.7 (H) 4.0 - 10.5 K/uL   RBC 3.26 (L) 4.22 - 5.81 MIL/uL   Hemoglobin 8.6 (L) 13.0 - 17.0 g/dL   HCT 27.5 (L) 39 - 52 %   MCV 84.4 80.0 - 100.0 fL   MCH 26.4 26.0 - 34.0 pg   MCHC 31.3 30.0 - 36.0 g/dL   RDW 17.2 (H) 11.5 - 15.5 %   Platelets 132 (L) 150 - 400 K/uL   nRBC 0.0 0.0 - 0.2 %   Neutrophils Relative % 37 %   Neutro Abs 5.0 1.7 - 7.7 K/uL   Lymphocytes Relative 55 %   Lymphs Abs 7.6 (H) 0.7 - 4.0 K/uL   Monocytes Relative 7 %   Monocytes Absolute 1.0 0.1 - 1.0 K/uL  Eosinophils Relative 1 %   Eosinophils Absolute 0.1 0.0 - 0.5 K/uL   Basophils Relative 0 %   Basophils Absolute 0.0 0.0 - 0.1 K/uL   Immature Granulocytes 0 %    Abs Immature Granulocytes 0.04 0.00 - 0.07 K/uL    Comment: Performed at [facility] [address], [place], [place] [address]  TSH     Status: None   Collection Time: 11/08/19  4:45 AM  Result Value Ref Range   TSH 1.517 0.350 - 4.500 uIU/mL    Comment: Performed by a 3rd Generation assay with a functional sensitivity of <=0.01 uIU/mL. Performed at [facility], [place] [address], [place], [place] [address]   Comprehensive metabolic panel     Status: Abnormal   Collection Time: 11/08/19  4:45 AM  Result Value Ref Range   Sodium 134 (L) 135 - 145 mmol/L   Potassium 4.3 3.5 - 5.1 mmol/L   Chloride 99 98 - 111 mmol/L   CO2 18 (L) 22 - 32 mmol/L   Glucose, Bld 129 (H) 70 - 99 mg/dL    Comment: Glucose reference range applies only to samples taken after fasting for at least 8 hours.   BUN 158 (H) 8 - 23 mg/dL   Creatinine, Ser 8.26 (H) 0.61 - 1.24 mg/dL   Calcium 7.6 (L) 8.9 - 10.3 mg/dL   Total Protein 5.7 (L) 6.5 - 8.1 g/dL   Albumin 3.0 (L) 3.5 - 5.0 g/dL   AST 24 15 - 41 U/L   ALT 35 0 - 44 U/L   Alkaline Phosphatase 59 38 - 126 U/L   Total Bilirubin 1.0 0.3 - 1.2 mg/dL   GFR, Estimated 6 (L) >60 mL/min    Comment: (NOTE) Calculated using the CKD-EPI Creatinine Equation (2021)    Anion gap 17 (H) 5 - 15    Comment: Performed at [facility], [place] [address], [place], [place] [address]  Glucose, capillary     Status: Abnormal   Collection Time: 11/08/19  7:57 AM  Result Value Ref Range   Glucose-Capillary 111 (H) 70 - 99 mg/dL    Comment: Glucose reference range applies only to samples taken after fasting for at least 8 hours.  Protime-INR     Status: Abnormal   Collection Time: 11/08/19  7:59 AM  Result Value Ref Range   Prothrombin Time 16.6 (H) 11.4 - 15.2 seconds   INR 1.4 (H) 0.8 - 1.2    Comment: (NOTE) INR goal varies based on device and disease states. Performed at [facility], [place] [address] [place], [place] [address]   Glucose,  capillary     Status: Abnormal   Collection Time: 11/08/19 12:26 PM  Result Value Ref Range   Glucose-Capillary 119 (H) 70 - 99 mg/dL    Comment: Glucose reference range applies only to samples taken after fasting for at least 8 hours.     ROS:  A comprehensive review of systems was negative except for: Constitutional: positive for fatigue Cardiovascular: positive for dyspnea, lower extremity edema and paroxysmal nocturnal dyspnea  Physical Exam: Vitals:   11/08/19 0421 11/08/19 1152  BP: (!) 129/51 (!) 112/42  Pulse: 60 63  Resp: 18 18  Temp: 98 F (36.7 C) 98.7 F (37.1 C)  SpO2: 93% 95%     General: lying in bed-  comfortable HEENT: PERRLA, EOMI Neck: positive for JVD Heart: RRR Lungs: dec BS at bases Abdomen: slightly distended Extremities: 3 +  pitting edema Skin: warm and dry Neuro: alert and non focal  Left upper arm well healed incision-  Good thrill and bruit to AVF  Assessment/Plan: 79 year old BM  With DM, HTN and progressive CKD-  Now is time to initiate dialysis  1.Renal- advanced CKD for some time-  Now with BUN 158 and some FTT in addition to volume overload - consents to start HD.  Have consulted IR to place [facility] followed by first HD today,  Likely second tomorrow or Saturday.  Our team has been activated to get OP HD spot and OP HD education started  2. Hypertension/volume  - very overloaded-  Given lasix last night and this AM-  UF as able with HD-  Will be slow process.  Hold OP BP meds for now to give [place] a pressure to work with to pull volume 3. Anemia  - will give ESA and check iron stores 4. Bones- continue vitamin D and renvela-  Vitamin D ultimate regimen will depend on the clinic that he goes to    [facility] 11/08/2019, 12:36 PM

## 2019-11-09 DIAGNOSIS — Z7189 Other specified counseling: Secondary | ICD-10-CM

## 2019-11-09 DIAGNOSIS — I5043 Acute on chronic combined systolic (congestive) and diastolic (congestive) heart failure: Secondary | ICD-10-CM

## 2019-11-09 LAB — GLUCOSE, CAPILLARY
Glucose-Capillary: 105 mg/dL — ABNORMAL HIGH (ref 70–99)
Glucose-Capillary: 148 mg/dL — ABNORMAL HIGH (ref 70–99)
Glucose-Capillary: 151 mg/dL — ABNORMAL HIGH (ref 70–99)
Glucose-Capillary: 155 mg/dL — ABNORMAL HIGH (ref 70–99)
Glucose-Capillary: 182 mg/dL — ABNORMAL HIGH (ref 70–99)
Glucose-Capillary: 195 mg/dL — ABNORMAL HIGH (ref 70–99)
Glucose-Capillary: 215 mg/dL — ABNORMAL HIGH (ref 70–99)
Glucose-Capillary: 227 mg/dL — ABNORMAL HIGH (ref 70–99)

## 2019-11-09 LAB — RENAL FUNCTION PANEL
Albumin: 2.8 g/dL — ABNORMAL LOW (ref 3.5–5.0)
Anion gap: 18 — ABNORMAL HIGH (ref 5–15)
BUN: 168 mg/dL — ABNORMAL HIGH (ref 8–23)
CO2: 17 mmol/L — ABNORMAL LOW (ref 22–32)
Calcium: 7.4 mg/dL — ABNORMAL LOW (ref 8.9–10.3)
Chloride: 99 mmol/L (ref 98–111)
Creatinine, Ser: 8.42 mg/dL — ABNORMAL HIGH (ref 0.61–1.24)
GFR, Estimated: 6 mL/min — ABNORMAL LOW (ref >60)
Glucose, Bld: 136 mg/dL — ABNORMAL HIGH (ref 70–99)
Phosphorus: 6.5 mg/dL — ABNORMAL HIGH (ref 2.5–4.6)
Potassium: 3.9 mmol/L (ref 3.5–5.1)
Sodium: 134 mmol/L — ABNORMAL LOW (ref 135–145)

## 2019-11-09 LAB — CBC
HCT: 25.7 % — ABNORMAL LOW (ref 39.0–52.0)
Hemoglobin: 8.1 g/dL — ABNORMAL LOW (ref 13.0–17.0)
MCH: 26.3 pg (ref 26.0–34.0)
MCHC: 31.5 g/dL (ref 30.0–36.0)
MCV: 83.4 fL (ref 80.0–100.0)
Platelets: 131 10*3/uL — ABNORMAL LOW (ref 150–400)
RBC: 3.08 MIL/uL — ABNORMAL LOW (ref 4.22–5.81)
RDW: 17.3 % — ABNORMAL HIGH (ref 11.5–15.5)
WBC: 15.4 10*3/uL — ABNORMAL HIGH (ref 4.0–10.5)
nRBC: 0 % (ref 0.0–0.2)

## 2019-11-09 LAB — HEPATITIS B CORE ANTIBODY, TOTAL: Hep B Core Total Ab: NONREACTIVE

## 2019-11-09 LAB — MAGNESIUM: Magnesium: 2.4 mg/dL (ref 1.7–2.4)

## 2019-11-09 LAB — ALT: ALT: 33 U/L (ref 0–44)

## 2019-11-09 LAB — HEPATITIS B SURFACE ANTIGEN: Hepatitis B Surface Ag: NONREACTIVE

## 2019-11-09 MED ORDER — CHLORHEXIDINE GLUCONATE CLOTH 2 % EX PADS
6.0000 | MEDICATED_PAD | Freq: Every day | CUTANEOUS | Status: DC
  Administered 2019-11-10 – 2019-11-11 (×2): 6 via TOPICAL

## 2019-11-09 MED ORDER — SODIUM CHLORIDE 0.9 % IV SOLN
125.0000 mg | Freq: Every day | INTRAVENOUS | Status: AC
  Administered 2019-11-09 – 2019-11-13 (×5): 125 mg via INTRAVENOUS
  Filled 2019-11-09 (×6): qty 10

## 2019-11-09 MED ORDER — NEPRO/CARBSTEADY PO LIQD
237.0000 mL | Freq: Two times a day (BID) | ORAL | Status: DC
  Administered 2019-11-09 – 2019-11-13 (×5): 237 mL via ORAL
  Filled 2019-11-09: qty 237

## 2019-11-09 MED ORDER — INSULIN NPH (HUMAN) (ISOPHANE) 100 UNIT/ML ~~LOC~~ SUSP
18.0000 [IU] | Freq: Every day | SUBCUTANEOUS | Status: DC
  Administered 2019-11-10 – 2019-11-11 (×2): 18 [IU] via SUBCUTANEOUS
  Filled 2019-11-09: qty 10

## 2019-11-09 NOTE — Progress Notes (Signed)
Occupational Therapy Treatment Patient Details Name: DAMIN SALIDO MRN: [id number] DOB: 05-11-1940 Today's Date: 11/09/2019    History of present illness Pt is a 79 y/o male with PMH of CLL, anemia, CKD stage V, DM2, HTN, thrombocytopenia, presenting with increased leg swelling, fluid overload, progressive dyspnea. Admitted for fluid overload in setting of decompensated CKD stage V.    OT comments  Patient supine in bed and agreeable to OT session.  Remains limited by decreased activity tolerance and endurance, fatiguing easily with minimal activity in room.  Completing transfers with min guard using RW, cueing for hand placement and safety; LB ADLs with min assist and will benefit from further AE education.  Initated education on energy conservation techniques. Updated dc plan to [facility] services to optimize pt return to daily routines and independence. Will follow acutely.    Follow Up Recommendations  Home health OT;Supervision - Intermittent    Equipment Recommendations  3 in 1 bedside commode    Recommendations for Other Services      Precautions / Restrictions Precautions Precautions: Fall Restrictions Weight Bearing Restrictions: No       Mobility Bed Mobility Overal bed mobility: Needs Assistance Bed Mobility: Supine to Sit;Sit to Supine     Supine to sit: Supervision Sit to supine: Supervision   General bed mobility comments: HOB elevated with increased time but no physical assist required  Transfers Overall transfer level: Needs assistance Equipment used: Rolling walker (2 wheeled) Transfers: Sit to/from Stand Sit to Stand: Min guard         General transfer comment: increased time, cueing for hand placement and safety; min guard to steady during transition     Balance Overall balance assessment: Needs assistance Sitting-balance support: Feet supported;No upper extremity supported Sitting balance-Leahy Scale: Fair     Standing balance support:  Bilateral upper extremity supported;During functional activity Standing balance-Leahy Scale: Poor Standing balance comment: preference to UE support                            ADL either performed or assessed with clinical judgement   ADL Overall ADL's : Needs assistance/impaired     Grooming: Min guard;Standing               Lower Body Dressing: Minimal assistance;Sit to/from stand Lower Body Dressing Details (indicate cue type and reason): min guard sit to stand, min assist to adjust socks will benefit from AE training  Toilet Transfer: Min guard;Ambulation;RW Toilet Transfer Details (indicate cue type and reason): simulated in room          Functional mobility during ADLs: Min guard;Rolling walker General ADL Comments: pt limited by decreased activity tolerance and endurance      Vision       Perception     Praxis      Cognition Arousal/Alertness: Awake/alert Behavior During Therapy: WFL for tasks assessed/performed Overall Cognitive Status: Within Functional Limits for tasks assessed                                          Exercises     Shoulder Instructions       General Comments spouse present and supportive, discussed energy conservation techniques    Pertinent Vitals/ Pain       Pain Assessment: No/denies pain  Home Living  Prior Functioning/Environment              Frequency  Min 2X/week        Progress Toward Goals  OT Goals(current goals can now be found in the care plan section)  Progress towards OT goals: Progressing toward goals  Acute Rehab OT Goals Patient Stated Goal: to feel better OT Goal Formulation: With patient  Plan Frequency remains appropriate;Discharge plan needs to be updated    Co-evaluation                 AM-PAC OT "6 Clicks" Daily Activity     Outcome Measure   Help from another person eating meals?: A  Little Help from another person taking care of personal grooming?: A Little Help from another person toileting, which includes using toliet, bedpan, or urinal?: A Little Help from another person bathing (including washing, rinsing, drying)?: A Little Help from another person to put on and taking off regular upper body clothing?: A Little Help from another person to put on and taking off regular lower body clothing?: A Little 6 Click Score: 18    End of Session Equipment Utilized During Treatment: Rolling walker  OT Visit Diagnosis: Other abnormalities of gait and mobility (R26.89);Muscle weakness (generalized) (M62.81)   Activity Tolerance Patient tolerated treatment well   Patient Left with call bell/phone within reach;Other (comment);with family/visitor present (seated EOB )   Nurse Communication Mobility status        Time: 2707-8675 OT Time Calculation (min): 17 min  Charges: OT General Charges $OT Visit: 1 Visit OT Treatments $Self Care/Home Management : 8-22 mins  Jolaine Artist, OT Acute Rehabilitation Services Pager [phone] Office [phone]    [name] 11/09/2019, 2:20 PM

## 2019-11-09 NOTE — Progress Notes (Signed)
PT Cancellation Note  Patient Details Name: Thomas Mcguire MRN: [id number] DOB: [birth year]   Cancelled Treatment:    Reason Eval/Treat Not Completed: Other (comment) patient eating lunch, again politely declines therapy until after he eats. Will attempt on next day of service at this point.    Windell Norfolk, DPT, PN1   Supplemental Physical Therapist [facility]    Pager [phone] Acute Rehab Office [phone]

## 2019-11-09 NOTE — Progress Notes (Signed)
PROGRESS NOTE  AXELL TRIGUEROS EZM:[id number] DOB: 1940/02/24   PCP: Leanna Battles, MD  Patient is from: Home  DOA: 11/07/2019 LOS: 2  Chief complaints: Leg swelling  Brief Narrative / Interim history: 79 year old M with history of CLL, CKD-5, DM-2, thrombocytopenia, anemia and HTN directed to ED from vascular surgery office with lower extremity edema and progressive dyspnea on exertion.  He is followed by Dr. Carolin Sicks at [facility].  He was at vascular surgeons office for assessment of his fistula.  In ED, hemodynamically stable.  99% on RA. Na 32. Cr 8.35. BUN 156. Bicarb 17. Ag 17.  WBC 16.7 with lymphocytosis (16.7 on 10/10).  Hgb 8.8 (9.4 on 10/10).  Platelet 142 (slightly lower than baseline).  Influenza and COVID-19 PCR negative.  CXR cardiomegaly with mild CHF.  Nephrology and IR consulted, and admitted for fluid overload and AKI on CKD-5 with azotemia.  Patient had tunneled HD cath on 11/08/2019 and started HD.   Subjective: Seen and examined earlier this morning.  No major events overnight of this morning.  Feels tired from lack of sleep overnight.  He had an HD with removal of 1 L.  Still with some shortness of breath.  No chest pain or GI symptoms.  Objective: Vitals:   11/09/19 0330 11/09/19 0345 11/09/19 0419 11/09/19 1150  BP: (!) 127/58 (!) 123/57 (!) 125/48 (!) 119/49  Pulse: 72 71 71 67  Resp:  18  18  Temp:  98.4 F (36.9 C) 98.3 F (36.8 C) 99 F (37.2 C)  TempSrc:  Oral Oral Oral  SpO2:  90% 91% 98%    Intake/Output Summary (Last 24 hours) at 11/09/2019 1427 Last data filed at 11/09/2019 0345 Gross per 24 hour  Intake 220 ml  Output 1000 ml  Net -780 ml   There were no vitals filed for this visit.  Examination:  GENERAL: No apparent distress.  Nontoxic. HEENT: MMM.  Vision and hearing grossly intact.  NECK: Supple.  No apparent JVD.  RESP: On room air.  No IWOB.  Fair aeration bilaterally. CVS:  RRR. Heart sounds normal.  ABD/GI/GU: BS+. Abd  soft, NTND.  MSK/EXT:  Moves extremities.  2+ pitting edema bilaterally. SKIN: no apparent skin lesion or wound NEURO: Awake, alert and oriented appropriately.  No apparent focal neuro deficit. PSYCH: Calm. Normal affect.  Procedures:  None  Microbiology summarized: COVID-19 PCR negative. Influenza PCR negative.  Assessment & Plan: AKI on CKD-5 with azotemia/bone mineral disorder: Cr 8.35.  BUN 156> 168.  No uremic symptoms. -Tunneled HD catheter on 11/08/2019 -First HD on 11/09/2019 -Nephrology managing-plan for HD tomorrow -Per renal navigator, patient has outpatient HD spot starting 11/13/2019.  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  Still with significant fluid overload and some dyspnea.  -Fluid management with HD.  Had ultrafiltration of 1 L last night. -GDMT-resume home Coreg and Imdur when BP improves.  Anion gap metabolic acidosis: Likely due to azotemia. Hyperphosphatemia-likely due to renal failure. -Per nephrology.  CLL/leukocytosis/ymphocytosis: Relatively stable. -Outpatient follow-up with hematology.  Controlled DM-2: A1c 6.8% on 07/11/2018. Recent Labs  Lab 11/08/19 2009 11/09/19 0004 11/09/19 0426 11/09/19 0804 11/09/19 1118  GLUCAP 215* 155* 105* 148* 182*  -Continue Novolin and 25 units before every meal -Continue SSI-renal -Monitor CBG. -Check hemoglobin A1c  Microcytic anemia/folic acid deficiency/anemia of renal disease -Monitor H&H -IV iron and Aranesp per nephrology. -Continue folic acid supplementation  Essential hypertension: Soft blood pressures of home BP medications. -Continue holding home BP meds  Chronic mild thrombocytopenia: Stable. -Monitor  Goal of care: Significant cardiorenal comorbidities and history of CLL. Still full code, which I believe would pose more harm than benefit -We will discuss this later today or in the morning. -May need  to involve palliative care  There is no height or weight on file to calculate BMI.         DVT prophylaxis:  SCDs Start: 11/08/19 0022  Code Status: Full code Family Communication: Patient and/or RN. Available if any question.  Status is: Inpatient  Remains inpatient appropriate because:Hemodynamically unstable, Unsafe d/c plan and Inpatient level of care appropriate due to severity of illness   Dispo: The patient is from: Home              Anticipated d/c is to: Home              Anticipated d/c date is: 3 days              Patient currently is not medically stable to d/c.       Consultants:  Nephrology IR   Sch Meds:  Scheduled Meds: . calcitRIOL  0.25 mcg Oral Q M,W,F  . Chlorhexidine Gluconate Cloth  6 each Topical Q0600  . [START ON 11/15/2019] darbepoetin (ARANESP) injection - DIALYSIS  100 mcg Intravenous Q Thu-HD  . insulin aspart  0-6 Units Subcutaneous Q4H  . [START ON 11/10/2019] insulin NPH Human  18 Units Subcutaneous QAC breakfast  . pravastatin  40 mg Oral QHS  . sevelamer carbonate  800 mg Oral TID WC  . sodium chloride flush  3 mL Intravenous Q12H   Continuous Infusions: . sodium chloride    . ferric gluconate (FERRLECIT/NULECIT) IV 125 mg (11/09/19 1247)   PRN Meds:.sodium chloride, acetaminophen **OR** acetaminophen, HYDROcodone-acetaminophen, ondansetron **OR** ondansetron (ZOFRAN) IV, sodium chloride flush  Antimicrobials: Anti-infectives (From admission, onward)   Start     Dose/Rate Route Frequency Ordered Stop   11/08/19 1717  ceFAZolin (ANCEF) 2-4 GM/100ML-% IVPB       Note to Pharmacy: Desiree Hane   : cabinet override      11/08/19 1717 11/09/19 0529   11/08/19 0945  ceFAZolin (ANCEF) IVPB 2g/100 mL premix        2 g 200 mL/hr over 30 Minutes Intravenous To Radiology 11/08/19 0855 11/08/19 1754       I have personally reviewed the following labs and images: CBC: Recent Labs  Lab 11/07/19 1902 11/08/19 0445 11/09/19 0300   WBC 16.7* 13.7* 15.4*  NEUTROABS 6.0 5.0  --   HGB 8.8* 8.6* 8.1*  HCT 28.3* 27.5* 25.7*  MCV 86.8 84.4 83.4  PLT 142* 132* 131*   BMP &GFR Recent Labs  Lab 11/07/19 1902 11/08/19 0445 11/09/19 0300  NA 132* 134* 134*  K 4.5 4.3 3.9  CL 98 99 99  CO2 17* 18* 17*  GLUCOSE 151* 129* 136*  BUN 156* 158* 168*  CREATININE 8.35* 8.26* 8.42*  CALCIUM 7.6* 7.6* 7.4*  MG  --  2.4 2.4  PHOS  --  6.2* 6.5*   Estimated Creatinine Clearance: 9.5 mL/min (A) (by C-G formula based on SCr of 8.42 mg/dL (H)). Liver & Pancreas: Recent Labs  Lab 11/07/19 1902 11/08/19 0445 11/09/19 0300  AST 27 24  --   ALT 36 35 33  ALKPHOS 64 59  --   BILITOT 1.3* 1.0  --   PROT  5.9* 5.7*  --   ALBUMIN 3.0* 3.0* 2.8*   No results for input(s): LIPASE, AMYLASE in the last 168 hours. No results for input(s): AMMONIA in the last 168 hours. Diabetic: Recent Labs    11/07/19 1902  HGBA1C 6.2*   Recent Labs  Lab 11/08/19 2009 11/09/19 0004 11/09/19 0426 11/09/19 0804 11/09/19 1118  GLUCAP 215* 155* 105* 148* 182*   Cardiac Enzymes: No results for input(s): CKTOTAL, CKMB, CKMBINDEX, TROPONINI in the last 168 hours. No results for input(s): PROBNP in the last 8760 hours. Coagulation Profile: Recent Labs  Lab 11/08/19 0759  INR 1.4*   Thyroid Function Tests: Recent Labs    11/08/19 0445  TSH 1.517   Lipid Profile: No results for input(s): CHOL, HDL, LDLCALC, TRIG, CHOLHDL, LDLDIRECT in the last 72 hours. Anemia Panel: Recent Labs    11/08/19 0445  VITAMINB12 2,893*  FOLATE 65.7  FERRITIN 270  TIBC 232*  IRON 28*  RETICCTPCT 2.3   Urine analysis:    Component Value Date/Time   COLORURINE YELLOW 07/11/2018 0802   APPEARANCEUR CLEAR 07/11/2018 0802   LABSPEC 1.009 07/11/2018 0802   PHURINE 6.0 07/11/2018 0802   GLUCOSEU NEGATIVE 07/11/2018 0802   HGBUR NEGATIVE 07/11/2018 0802   BILIRUBINUR NEGATIVE 07/11/2018 0802   KETONESUR NEGATIVE 07/11/2018 0802   PROTEINUR 100  (A) 07/11/2018 0802   NITRITE NEGATIVE 07/11/2018 0802   LEUKOCYTESUR NEGATIVE 07/11/2018 0802   Sepsis Labs: Invalid input(s): PROCALCITONIN, [place]  Microbiology: Recent Results (from the past 240 hour(s))  Respiratory Panel by RT PCR (Flu A&B, Covid) - Nasopharyngeal Swab     Status: None   Collection Time: 11/07/19  9:06 PM   Specimen: Nasopharyngeal Swab  Result Value Ref Range Status   SARS Coronavirus 2 by RT PCR NEGATIVE NEGATIVE Final    Comment: (NOTE) SARS-CoV-2 target nucleic acids are NOT DETECTED.  The SARS-CoV-2 RNA is generally detectable in upper respiratoy specimens during the acute phase of infection. The lowest concentration of SARS-CoV-2 viral copies this assay can detect is 131 copies/mL. A negative result does not preclude SARS-Cov-2 infection and should not be used as the sole basis for treatment or other patient management decisions. A negative result may occur with  improper specimen collection/handling, submission of specimen other than nasopharyngeal swab, presence of viral mutation(s) within the areas targeted by this assay, and inadequate number of viral copies (<131 copies/mL). A negative result must be combined with clinical observations, patient history, and epidemiological information. The expected result is Negative.  Fact Sheet for Patients:  PinkCheek.be  Fact Sheet for Healthcare Providers:  GravelBags.it  This test is no t yet approved or cleared by the [place] FDA and  has been authorized for detection and/or diagnosis of SARS-CoV-2 by FDA under an Emergency Use Authorization (EUA). This EUA will remain  in effect (meaning this test can be used) for the duration of the COVID-19 declaration under Section 564(b)(1) of the Act, 21 U.S.C. section 360bbb-3(b)(1), unless the authorization is terminated or revoked sooner.     Influenza A by PCR NEGATIVE NEGATIVE Final    Influenza B by PCR NEGATIVE NEGATIVE Final    Comment: (NOTE) The Xpert Xpress SARS-CoV-2/FLU/RSV assay is intended as an aid in  the diagnosis of influenza from Nasopharyngeal swab specimens and  should not be used as a sole basis for treatment. Nasal washings and  aspirates are unacceptable for Xpert Xpress SARS-CoV-2/FLU/RSV  testing.  Fact Sheet for Patients: PinkCheek.be  Fact Sheet for Healthcare  Providers: GravelBags.it  This test is not yet approved or cleared by the [place] and  has been authorized for detection and/or diagnosis of SARS-CoV-2 by  FDA under an Emergency Use Authorization (EUA). This EUA will remain  in effect (meaning this test can be used) for the duration of the  Covid-19 declaration under Section 564(b)(1) of the Act, 21  U.S.C. section 360bbb-3(b)(1), unless the authorization is  terminated or revoked. Performed at [facility], [place] [address] [place], [place] [address]     Radiology Studies: No results found.   Kynnedi Zweig T. [place]  If 7PM-7AM, please contact night-coverage [email or web address] 11/09/2019, 2:27 PM

## 2019-11-09 NOTE — Progress Notes (Signed)
   11/09/19 0345  Hand-Off documentation  Handoff Given Given to shift RN/LPN  Report given to (Full Name) Beatris Ship, RN  Handoff Received Received from shift RN/LPN  Report received from (Full Name) Aniruddh Ciavarella  Vitals  Temp 98.4 F (36.9 C)  Temp Source Oral  BP (!) 123/57  BP Location Left Arm  BP Method Automatic  Patient Position (if appropriate) Lying  Pulse Rate 71  Pulse Rate Source Monitor  Resp 18  Oxygen Therapy  SpO2 90 %  O2 Device Room Air  Pain Assessment  Pain Scale 0-10  Pain Score 0  Post-Hemodialysis Assessment  Rinseback Volume (mL) 250 mL  KECN 193 V  Dialyzer Clearance Lightly streaked  Duration of HD Treatment -hour(s) 2 hour(s)  Hemodialysis Intake (mL) 500 mL  UF Total -Machine (mL) 1500 mL  Net UF (mL) 1000 mL  Tolerated HD Treatment Yes  Post-Hemodialysis Comments tx achieved as expected, tolerated well, no complaints/concerns.  AVG/AVF Arterial Site Held (minutes) 0 minutes  AVG/AVF Venous Site Held (minutes) 0 minutes  Education / Care Plan  Dialysis Education Provided Yes  Documented Education in Care Plan Yes  Outpatient Plan of Care Reviewed and on Chart Yes  Hemodialysis Catheter Right Internal jugular Double lumen Permanent (Tunneled)  Placement Date/Time: 11/08/19 1736   Placed prior to admission: No  Time Out: Correct patient;Correct site;Correct procedure  Maximum sterile barrier precautions: Hand hygiene;Mask;Cap;Sterile gown;Large sterile sheet;Sterile gloves  Site Prep: Chlorh...  Site Condition Painful  Blue Lumen Status Heparin locked;Capped (Central line)  Red Lumen Status Heparin locked;Capped (Central line)  Catheter fill solution Heparin 1000 units/ml  Catheter fill volume (Arterial) 1.9 cc  Catheter fill volume (Venous) 1.9  Dressing Type Occlusive  Drainage Description None  Post treatment catheter status Capped and Clamped

## 2019-11-09 NOTE — Consult Note (Addendum)
Hospital Consult    Reason for Consult:  Access placement Requesting Physician:  Nephrology MRN #:  [id number]  History of Present Illness: This is a 79 y.o. Mcguire who was seen in the office several days ago developing bilateral lower extremity edema and uremia who presented to the emergency department for initiation of hemodialysis.  Surgical history significant for right radiocephalic fistula creation by Dr. Scot Dock on 05/2018.  This fistula never fully matured and was converted with a first stage basilic vein fistula creation by Dr. Scot Dock on 10/01/2019.  Interventional radiology placed a tunneled dialysis catheter and patient received his first hemodialysis treatment last night.  He is scheduled to have a second treatment either today or Saturday per nephrology.  Based on fistula duplex performed in the office, fistula is mature enough to proceed with second stage basilic vein transposition.  Patient is also willing to proceed.   Past Medical History:  Diagnosis Date  . Anemia    low iron  . Arthritis   . Chronic renal insufficiency    CHECKED Q3MOS PER PT. Stage 4 as of 05/22/2018 per pt.  . Diabetes mellitus    type 2  . ESRD (end stage renal disease) ([place])   . Gout    Has not had recently- 08/24/11  . Hyperlipidemia   . Hypertension   . 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 PAD (peripheral artery disease) ([place])   . Wears dentures     Past Surgical History:  Procedure Laterality Date  . A/V FISTULAGRAM N/A 08/11/2018   Procedure: A/V FISTULAGRAM - Right Arm;  Surgeon: Angelia Mould, MD;  Location: [facility];  Service: Cardiovascular;  Laterality: N/A;  . A/V FISTULAGRAM N/A 09/07/2019   Procedure: A/V FISTULAGRAM - Right AVF;  Surgeon: Angelia Mould, MD;  Location: [facility];  Service: Cardiovascular;  Laterality: N/A;  . ABDOMINAL AORTOGRAM W/LOWER EXTREMITY Right 07/03/2018   Procedure: ABDOMINAL AORTOGRAM W/LOWER  EXTREMITY;  Surgeon: Waynetta Sandy, MD;  Location: [facility];  Service: Cardiovascular;  Laterality: Right;  . AMPUTATION  08/27/2011   Procedure: AMPUTATION DIGIT;  Surgeon: Newt Minion, MD;  Location: [facility];  Service: Orthopedics;  Laterality: Left;  Left Foot 3rd toe Amputation MTP joint  . AMPUTATION Right 07/11/2018   Procedure: AMPUTATION RIGHT GREAT TOE;  Surgeon: Angelia Mould, MD;  Location: [facility];  Service: Vascular;  Laterality: Right;  . amputation great toe  2009   left  . AV FISTULA PLACEMENT Right 05/23/2018   Procedure: RADIAL- CEPHALIC ARTERIOVENOUS (AV) FISTULA CREATION RIGHT ARM AND LIGATION OF COMPETING BRANCH.;  Surgeon: Angelia Mould, MD;  Location: [facility];  Service: Vascular;  Laterality: Right;  . BASCILIC VEIN TRANSPOSITION Right 10/01/2019   Procedure: FIRST STAGE BASCILIC VEIN TRANSPOSITION;  Surgeon: Angelia Mould, MD;  Location: [facility];  Service: Vascular;  Laterality: Right;  . COLONOSCOPY    . cyst elbow  1999   right  . FEMORAL-POPLITEAL BYPASS GRAFT Right 07/11/2018   Procedure: BYPASS GRAFT FEMORAL-POPLITEAL ARTERY;  Surgeon: Angelia Mould, MD;  Location: [facility];  Service: Vascular;  Laterality: Right;  . FOOT SURGERY     both feet for something that was torn  . LIGATION OF ARTERIOVENOUS  FISTULA Right 10/01/2019   Procedure: LIGATION OF RIGHT RADIOCEPHALIC  ARTERIOVENOUS  FISTULA;  Surgeon: Angelia Mould, MD;  Location: [facility];  Service: Vascular;  Laterality: Right;  .  TOE AMPUTATION  2010   left 2nd toe  . UPPER GI ENDOSCOPY      No Known Allergies  Prior to Admission medications   Medication Sig Start Date End Date Taking? Authorizing Provider  allopurinol (ZYLOPRIM) 100 MG tablet Take 100 mg by mouth daily.  09/29/18  Yes [provider]  amLODipine (NORVASC) 5 MG tablet Take 5 mg by mouth in the morning and at bedtime.    Yes [provider]  aspirin 81 MG tablet Take 81  mg by mouth daily.   Yes [provider]  calcitRIOL (ROCALTROL) 0.25 MCG capsule Take 0.25 mcg by mouth every Monday, Wednesday, and Friday.  10/17/Thomas  Yes [provider]  carvedilol (COREG) 25 MG tablet Take 12.5 mg by mouth 2 (two) times daily with a meal.    Yes [provider]  COD LIVER OIL PO Take 1 capsule by mouth 2 (two) times daily.    Yes [provider]  cyanocobalamin (,VITAMIN B-12,) 1000 MCG/ML injection Vitamin B12 injection: 1000 mcg (1 mL) by subcu injection once daily for 7 days; then once weekly for 4 weeks; then once monthly thereafter.  7 07/15/2010 Patient taking differently: Inject 1,000 mcg into the muscle every 30 (thirty) days.  12/21/18  Yes Ladell Pier, MD  doxazosin (CARDURA) 2 MG tablet Take 2 mg by mouth at bedtime.    Yes [provider]  ferrous sulfate 325 (65 FE) MG tablet Take 325 mg by mouth 2 (two) times daily with a meal.    Yes [provider]  folic acid (FOLVITE) 1 MG tablet TAKE 1 TABLET BY MOUTH EVERY DAY Patient taking differently: Take 1 mg by mouth daily.  09/06/19  Yes Ladell Pier, MD  furosemide (LASIX) 80 MG tablet Take 1 tablet (80 mg total) by mouth 2 (two) times daily. 09/20/18  Yes Daleen Bo, MD  insulin NPH (HUMULIN N,NOVOLIN N) 100 UNIT/ML injection Inject 50 Units into the skin 2 (two) times daily.    Yes [provider]  insulin regular (HUMULIN R,NOVOLIN R) 100 units/mL injection Inject 20-25 Units into the skin 3 (three) times daily before meals. Sliding Scale   Yes [provider]  isosorbide mononitrate (IMDUR) 30 MG 24 hr tablet Take 30 mg by mouth daily. 10/28/Thomas  Yes [provider]  potassium chloride (K-DUR) 10 MEQ tablet Take 10 mEq by mouth 2 (two) times daily.  10/23/Thomas  Yes [provider]  pravastatin (PRAVACHOL) 40 MG tablet Take 40 mg by mouth at bedtime.    Yes [provider]  sevelamer carbonate (RENVELA) 800 MG  tablet Take 800 mg by mouth 3 (three) times daily. 10/05/19  Yes [provider]    Social History   Socioeconomic History  . Marital status: Married    Spouse name: [name]  . Number of children: Not on file  . Years of education: Not on file  . Highest education level: Not on file  Occupational History  . Not on file  Tobacco Use  . Smoking status: Former Smoker    Types: Cigarettes    Quit date: 03/11/1996    Years since quitting: 23.6  . Smokeless tobacco: Never Used  Vaping Use  . Vaping Use: Never used  Substance and Sexual Activity  . Alcohol use: No  . Drug use: No  . Sexual activity: Not Currently  Other Topics Concern  . Not on file  Social History Narrative  . Not on  file   Social Determinants of Health   Financial Resource Strain:   . Difficulty of Paying Living Expenses: Not on file  Food Insecurity:   . Worried About Charity fundraiser in the Last Year: Not on file  . Ran Out of Food in the Last Year: Not on file  Transportation Needs:   . Lack of Transportation (Medical): Not on file  . Lack of Transportation (Non-Medical): Not on file  Physical Activity:   . Days of Exercise per Week: Not on file  . Minutes of Exercise per Session: Not on file  Stress:   . Feeling of Stress : Not on file  Social Connections:   . Frequency of Communication with Friends and Family: Not on file  . Frequency of Social Gatherings with Friends and Family: Not on file  . Attends Religious Services: Not on file  . Active Member of Clubs or Organizations: Not on file  . Attends Archivist Meetings: Not on file  . Marital Status: Not on file  Intimate Partner Violence:   . Fear of Current or Ex-Partner: Not on file  . Emotionally Abused: Not on file  . Physically Abused: Not on file  . Sexually Abused: Not on file     Family History  Problem Relation Age of Onset  . Colon cancer Neg Hx     ROS: Otherwise negative unless mentioned in  HPI  Physical Examination  Vitals:   11/09/19 0345 11/09/19 0419  BP: (!) 123/57 (!) 125/48  Pulse: 71 71  Resp: 18   Temp: 98.4 F (36.9 C) 98.3 F (36.8 C)  SpO2: 90% 91%   There is no height or weight on file to calculate BMI.  General:  WDWN in NAD Gait: Not observed HENT: WNL, normocephalic Pulmonary: normal non-labored breathing, without Rales, rhonchi,  wheezing Cardiac: regular Abdomen: soft, NT/ND, no masses Skin: without rashes Extremities: Palpable thrill through basilic vein R arm; palpable R ulnar pulse Musculoskeletal: no muscle wasting or atrophy  Neurologic: A&O X 3;  No focal weakness or paresthesias are detected; speech is fluent/normal Psychiatric:  The pt has Normal affect. Lymph:  Unremarkable  CBC    Component Value Date/Time   WBC 15.4 (H) 11/09/2019 0300   RBC 3.08 (L) 11/09/2019 0300   HGB 8.1 (L) 11/09/2019 0300   HGB 11.5 (L) 09/24/2019 0929   HCT 25.7 (L) 11/09/2019 0300   PLT 131 (L) 11/09/2019 0300   PLT 175 09/24/2019 0929   MCV 83.4 11/09/2019 0300   MCH 26.3 11/09/2019 0300   MCHC 31.5 11/09/2019 0300   RDW 17.3 (H) 11/09/2019 0300   LYMPHSABS 7.6 (H) 11/08/2019 0445   MONOABS 1.0 11/08/2019 0445   EOSABS 0.1 11/08/2019 0445   BASOSABS 0.0 11/08/2019 0445    BMET    Component Value Date/Time   NA 134 (L) 11/09/2019 0300   K 3.9 11/09/2019 0300   CL 99 11/09/2019 0300   CO2 17 (L) 11/09/2019 0300   GLUCOSE 136 (H) 11/09/2019 0300   BUN 168 (H) 11/09/2019 0300   CREATININE 8.42 (H) 11/09/2019 0300   CREATININE 5.72 (HH) 12/28/2017 1009   CALCIUM 7.4 (L) 11/09/2019 0300   GFRNONAA 6 (L) 11/09/2019 0300   GFRNONAA 9 (L) 12/28/2017 1009   GFRAA 12 (L) 09/20/2018 1013   GFRAA 10 (L) 12/28/2017 1009    COAGS: Lab Results  Component Value Date   INR 1.4 (H) 11/08/2019   INR 1.2 07/11/2018   INR  1.18 08/24/2011     Non-Invasive Vascular Imaging:   Mature R basilic vein fistula with diameter >53mm   ASSESSMENT/PLAN:  This is a 79 y.o. Mcguire with ESRD now on HD  - HD has been initiated; patient dialyzing via R IJ TDC placed by IR - R basilic vein fistula is ready for 2nd stage transposition based on fistula duplex - Will try to proceed with surgery during current admission however, due to OR availability, this will likely need to be scheduled as an outpatient - Dr. Scot Dock will evaluate the patient and provide further treatment plans   Dagoberto Ligas PA-C [facility] [phone]  I have interviewed the patient and examined the patient. I agree with the findings by the PA.  Gae Gallop, MD [phone]

## 2019-11-09 NOTE — Progress Notes (Signed)
Pt back from HD. Report received from HD nurse.

## 2019-11-09 NOTE — Progress Notes (Signed)
Rounded on patient today in correlation to transition to outpatient HD. Attempt to round yesterday but patient was off the floor. Ordered consult to dietician and Kidney Failure book. Patient educated at the bedside regarding care of tunneled dialysis catheter, AV graft site care(plan to revise graft per vascular), assessment of thrill daily and proper medication administration on HD days.  Patient also educated on the importance of adhering to scheduled dialysis treatments, the effects of fluid overload, hyperkalemia, hyperphosphatemia and the significance of scheduled binders with meals. Patient capable of re-verbalizing via teach back method. Also educated patient on services available through the interdisciplinary team in the clinic setting and the differences they will note when transitioning to the outpatient setting from the hospital. Patient with no further questions at this time and reports he has spoken to close friends that are already on HD. He also reports that this morning HD "wore him out" educated patient that this will get better with time. Handouts and contact information provided to patient for any further assistance.Plan to follow through D/C.   Dorthey Sawyer, RN  Dialysis Nurse Coordinator Phone: [phone]

## 2019-11-09 NOTE — Progress Notes (Signed)
Subjective:  Ended up getting HD in the middle of the night so is tired but did not have any particular issues-  Removed 1000- also 200 of uop  Objective Vital signs in last 24 hours: Vitals:   11/09/19 0300 11/09/19 0330 11/09/19 0345 11/09/19 0419  BP: (!) 134/54 (!) 127/58 (!) 123/57 (!) 125/48  Pulse: 70 72 71 71  Resp:   18   Temp:   98.4 F (36.9 C) 98.3 F (36.8 C)  TempSrc:   Oral Oral  SpO2:   90% 91%   Weight change:   Intake/Output Summary (Last 24 hours) at 11/09/2019 1101 Last data filed at 11/09/2019 0345 Gross per 24 hour  Intake 220 ml  Output 1200 ml  Net -980 ml    Assessment/Plan: 79 year old BM  With DM, HTN and progressive CKD-  Now is time to initiate dialysis  1.Renal- advanced CKD for some time-  Now with BUN 158 and some FTT in addition to volume overload - consents to start HD.  Have consulted IR to place [facility] followed by first HD early 10/29,  Likely second tomorrow and third on Monday.  Our team has been activated to get OP HD spot and OP HD education started - possibly to [facility].  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 Hypertension/volume  - very overloaded-  Given lasix last night and this AM-  UF as able with HD-  Will be slow process.  Hold OP BP meds for now to give [place] a pressure to work with to pull volume 3. Anemia  - will give ESA  iron stores low, will replete 4. Bones- continue vitamin D and renvela-  Vitamin D ultimate regimen will depend on the clinic that he goes to     [name]: Basic Metabolic Panel: Recent Labs  Lab 11/07/19 1902 11/08/19 0445 11/09/19 0300  NA 132* 134* 134*  K 4.5 4.3 3.9  CL 98 99 99  CO2 17* 18* 17*  GLUCOSE 151* 129* 136*  BUN 156* 158* 168*  CREATININE 8.35* 8.26* 8.42*  CALCIUM 7.6* 7.6* 7.4*  PHOS  --  6.2* 6.5*   Liver Function Tests: Recent Labs  Lab 11/07/19 1902 11/08/19 0445 11/09/19 0300   AST 27 24  --   ALT 36 35 33  ALKPHOS 64 59  --   BILITOT 1.3* 1.0  --   PROT 5.9* 5.7*  --   ALBUMIN 3.0* 3.0* 2.8*   No results for input(s): LIPASE, AMYLASE in the last 168 hours. No results for input(s): AMMONIA in the last 168 hours. CBC: Recent Labs  Lab 11/07/19 1902 11/08/19 0445 11/09/19 0300  WBC 16.7* 13.7* 15.4*  NEUTROABS 6.0 5.0  --   HGB 8.8* 8.6* 8.1*  HCT 28.3* 27.5* 25.7*  MCV 86.8 84.4 83.4  PLT 142* 132* 131*   Cardiac Enzymes: No results for input(s): CKTOTAL, CKMB, CKMBINDEX, TROPONINI in the last 168 hours. CBG: Recent Labs  Lab 11/08/19 1619 11/08/19 2009 11/09/19 0004 11/09/19 0426 11/09/19 0804  GLUCAP 122* 215* 155* 105* 148*    Iron Studies:  Recent Labs    11/08/19 0445  IRON 28*  TIBC 232*  FERRITIN 270   Studies/Results: DG Chest Portable 1 View  Result Date: 11/07/2019 CLINICAL DATA:  79 year old male with renal failure. EXAM: PORTABLE CHEST 1 VIEW COMPARISON:  Chest  radiograph dated 10/20/2019 FINDINGS: Cardiomegaly with vascular congestion and mild edema. No focal consolidation, pleural effusion, pneumothorax. Atherosclerotic calcification of the aorta. No acute osseous pathology. IMPRESSION: Cardiomegaly with mild CHF or fluid overload. Electronically Signed   By: Anner Crete M.D.   On: 11/07/2019 21:35   VAS US DUPLEX DIALYSIS ACCESS (AVF, AVG)  Result Date: 11/07/2019 DIALYSIS ACCESS Reason for Exam: Routine follow up. Access Site: Right Upper Extremity. Access Type: Basilic vein transposition 10/01/2019 with ligation of right              radiocephalic AVF. Performing Technologist: Ronal Fear RVS, RCS  Examination Guidelines: A complete evaluation includes B-mode imaging, spectral Doppler, color Doppler, and power Doppler as needed of all accessible portions of each vessel. Unilateral testing is considered an integral part of a complete examination. Limited examinations for reoccurring indications may be performed as  noted.  Findings: +--------------------+----------+-----------------+--------+  AVF                  PSV (cm/s) Flow Vol (mL/min) Comments  +--------------------+----------+-----------------+--------+  Native artery inflow    256            902                  +--------------------+----------+-----------------+--------+  AVF Anastomosis         647                                 +--------------------+----------+-----------------+--------+  +------------+----------+-------------+----------+--------+  OUTFLOW VEIN PSV (cm/s) Diameter (cm) Depth (cm) Describe  +------------+----------+-------------+----------+--------+  Prox UA         157         0.66         0.80              +------------+----------+-------------+----------+--------+  Mid UA          215         0.70         1.13              +------------+----------+-------------+----------+--------+  Dist UA         310         0.60         1.23              +------------+----------+-------------+----------+--------+  AC Fossa        665         0.52         0.95              +------------+----------+-------------+----------+--------+   Summary: 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 *See table(s) above for measurements and observations.  Diagnosing physician: Deitra Mayo MD Electronically signed by Deitra Mayo MD on 11/07/2019 at 2:53:42 PM.   --------------------------------------------------------------------------------   Final    ECHOCARDIOGRAM COMPLETE  Result Date: 11/08/2019    ECHOCARDIOGRAM REPORT   Patient Name:   Thomas Mcguire Date of Exam: 11/08/2019 Medical Rec #:  [id number]         Height:       78.0 in Accession #:    [id number]        Weight:       212.0 lb Date of Birth:  1940/05/15         BSA:          2.314 m Patient Age:    19 years  BP:           129/51 mmHg Patient Gender: M                 HR:           63 bpm. Exam Location:  Inpatient Procedure: 2D Echo, Cardiac Doppler and Color  Doppler Indications:    I51.7 Cardiomegaly  History:        Patient has no prior history of Echocardiogram examinations.                 PAD; Risk Factors:Hypertension, Diabetes and Dyslipidemia. ESRD.  Sonographer:    Jonelle Sidle Dance Referring Phys: [id number] [employer]  1. Left ventricular ejection fraction, by estimation, is 35 to 40%. The left ventricle has moderately decreased function. The left ventricle demonstrates global hypokinesis. The left ventricular internal cavity size was mildly dilated. Left ventricular diastolic parameters are consistent with Grade II diastolic dysfunction (pseudonormalization).  2. Right ventricular systolic function is normal. The right ventricular size is normal. There is mildly elevated pulmonary artery systolic pressure. The estimated right ventricular systolic pressure is 14.4 mmHg.  3. The mitral valve is abnormal. Restriction of the posterior leaflet with moderate mitral valve regurgitation (PISA ERO 0.25 cm^2). No evidence of mitral stenosis.  4. The aortic valve is tricuspid. Aortic valve regurgitation is mild. No aortic stenosis is present.  5. Aortic dilatation noted. There is mild dilatation of the aortic root, measuring 40 mm.  6. Left atrial size was severely dilated.  7. Right atrial size was mildly dilated.  8. The inferior vena cava is dilated in size with <50% respiratory variability, suggesting right atrial pressure of 15 mmHg. FINDINGS  Left Ventricle: Left ventricular ejection fraction, by estimation, is 35 to 40%. The left ventricle has moderately decreased function. The left ventricle demonstrates global hypokinesis. The left ventricular internal cavity size was mildly dilated. There is no left ventricular hypertrophy. Left ventricular diastolic parameters are consistent with Grade II diastolic dysfunction (pseudonormalization). Right Ventricle: The right ventricular size is normal. No increase in right ventricular wall thickness. Right  ventricular systolic function is normal. There is mildly elevated pulmonary artery systolic pressure. The tricuspid regurgitant velocity is 2.69  m/s, and with an assumed right atrial pressure of 15 mmHg, the estimated right ventricular systolic pressure is 31.5 mmHg. Left Atrium: Left atrial size was severely dilated. Right Atrium: Right atrial size was mildly dilated. Pericardium: There is no evidence of pericardial effusion. Mitral Valve: The mitral valve is abnormal. There is mild calcification of the mitral valve leaflet(s). Mild mitral annular calcification. Moderate mitral valve regurgitation. No evidence of mitral valve stenosis. Tricuspid Valve: The tricuspid valve is normal in structure. Tricuspid valve regurgitation is mild. Aortic Valve: The aortic valve is tricuspid. Aortic valve regurgitation is mild. Aortic regurgitation PHT measures 335 msec. No aortic stenosis is present. Pulmonic Valve: The pulmonic valve was normal in structure. Pulmonic valve regurgitation is trivial. Aorta: Aortic dilatation noted. There is mild dilatation of the aortic root, measuring 40 mm. Venous: The inferior vena cava is dilated in size with less than 50% respiratory variability, suggesting right atrial pressure of 15 mmHg. IAS/Shunts: No atrial level shunt detected by color flow Doppler.  LEFT VENTRICLE PLAX 2D LVIDd:         6.07 cm  Diastology LVIDs:         4.76 cm  LV e' medial:   5.44 cm/s LV PW:  1.36 cm  LV E/e' medial: 21.3 LV IVS:        0.94 cm LVOT diam:     2.50 cm LV SV:         88 LV SV Index:   38 LVOT Area:     4.91 cm  RIGHT VENTRICLE             IVC RV Basal diam:  3.56 cm     IVC diam: 3.38 cm RV Mid diam:    2.76 cm RV S prime:     12.50 cm/s TAPSE (M-mode): 2.0 cm LEFT ATRIUM              Index       RIGHT ATRIUM           Index LA diam:        6.30 cm  2.72 cm/m  RA Area:     21.10 cm LA Vol (A2C):   140.0 ml 60.50 ml/m RA Volume:   62.10 ml  26.84 ml/m LA Vol (A4C):   171.0 ml 73.89 ml/m  LA Biplane Vol: 157.0 ml 67.84 ml/m  AORTIC VALVE LVOT Vmax:   71.25 cm/s LVOT Vmean:  47.850 cm/s LVOT VTI:    0.178 m AI PHT:      335 msec  AORTA Ao Root diam: 4.00 cm Ao Asc diam:  3.30 cm MITRAL VALVE                 TRICUSPID VALVE MV Area (PHT): 2.50 cm      TR Peak grad:   28.9 mmHg MV Decel Time: 303 msec      TR Vmax:        269.00 cm/s MR Peak grad:    92.5 mmHg MR Mean grad:    63.5 mmHg   SHUNTS MR Vmax:         481.00 cm/s Systemic VTI:  0.18 m MR Vmean:        376.5 cm/s  Systemic Diam: 2.50 cm MR PISA:         3.08 cm MR PISA Eff ROA: 25 mm MR PISA Radius:  0.70 cm MV E velocity: 116.00 cm/s MV A velocity: 44.90 cm/s MV E/A ratio:  2.58 Loralie Champagne MD Electronically signed by Loralie Champagne MD Signature Date/Time: 11/08/2019/3:45:08 PM    Final    Medications: Infusions:  sodium chloride      Scheduled Medications:  calcitRIOL  0.25 mcg Oral Q M,W,F   Chlorhexidine Gluconate Cloth  6 each Topical Q0600   [START ON 11/15/2019] darbepoetin (ARANESP) injection - DIALYSIS  100 mcg Intravenous Q Thu-HD   insulin aspart  0-6 Units Subcutaneous Q4H   [START ON 11/10/2019] insulin NPH Human  18 Units Subcutaneous QAC breakfast   pravastatin  40 mg Oral QHS   sevelamer carbonate  800 mg Oral TID WC   sodium chloride flush  3 mL Intravenous Q12H    have reviewed scheduled and prn medications.  Physical Exam: General: NAD Heart: RRR Lungs: mostly clear Abdomen: distended- non tender Extremities: 3+ pitting edema Dialysis Access: right sided TDC-  Also with right AVF s/p first stage- due for second stage - good thrill and bruit    11/09/2019,11:01 AM  LOS: 2 days

## 2019-11-09 NOTE — Progress Notes (Signed)
Patient refused bed alarm, falls education done with patient. Patient verbalizes understanding. Call bell and personal items placed in reach of patient.

## 2019-11-09 NOTE — Progress Notes (Signed)
Pt transported to HD. Report given to HD nurse.

## 2019-11-09 NOTE — Plan of Care (Signed)
  Problem: Education: Goal: Knowledge of General Education information will improve Description: Including pain rating scale, medication(s)/side effects and non-pharmacologic comfort measures Outcome: Progressing   Problem: Clinical Measurements: Goal: Ability to maintain clinical measurements within normal limits will improve Outcome: Progressing Goal: Will remain free from infection Outcome: Progressing   

## 2019-11-09 NOTE — Progress Notes (Signed)
Initial Nutrition Assessment  DOCUMENTATION CODES:   Not applicable  INTERVENTION:  Provide Nepro Shake po BID, each supplement provides 425 kcal and 19 grams protein.  Encourage adequate PO intake.   Renal diet education given.   NUTRITION DIAGNOSIS:   Increased nutrient needs related to chronic illness (CKDV on HD) as evidenced by estimated needs.  GOAL:   Patient will meet greater than or equal to 90% of their needs  MONITOR:   PO intake, Supplement acceptance, Skin, Weight trends, Labs, I & O's  REASON FOR ASSESSMENT:   Consult Diet education  ASSESSMENT:   79 year old M with history of CLL, CKD-5, DM-2, thrombocytopenia, anemia and HTN presents with lower extremity edema and progressive dyspnea on exertion. Pt admitted for fluid overload and AKI on CKD-5 with azotemia. Patient had tunneled HD cath on 11/08/2019 and started HD.   Pt eating during time of visit. Pt reports having a decreased appetite for > 1 month prior to admission and would consume only 1 meal a day. Wife at bedside has been encouraging PO intake. Pt with no significant weight loss per weight records, however noted pt with edema. Edema may be masking weight loss. RD to order nutritional supplements to aid in caloric and protein needs. RD consulted for diet education regarding renal diet. Handout "Eating Healthy with Kidney Disease" given and discussed.   NUTRITION - FOCUSED PHYSICAL EXAM:    Most Recent Value  Orbital Region Unable to assess  Upper Arm Region Unable to assess  Thoracic and Lumbar Region Unable to assess  Buccal Region Unable to assess  Temple Region Unable to assess  Clavicle Bone Region Moderate depletion  Clavicle and Acromion Bone Region Moderate depletion  Scapular Bone Region Unable to assess  Dorsal Hand Unable to assess  Patellar Region No depletion  Posterior Calf Region No depletion  Edema (RD Assessment) Moderate  Hair Reviewed  Eyes Reviewed  Mouth Reviewed  Skin  Reviewed  Nails Reviewed     Labs and medications reviewed. Phosphorous elevated at 6.5.  Diet Order:   Diet Order            Diet renal/carb modified with fluid restriction Diet-HS Snack? Nothing; Fluid restriction: 1200 mL Fluid; Room service appropriate? Yes; Fluid consistency: Thin  Diet effective now                 EDUCATION NEEDS:   Not appropriate for education at this time  Skin:  Skin Assessment: Reviewed RN Assessment  Last BM:  10/28  Height:   Ht Readings from Last 1 Encounters:  11/07/19 6\' 6"  (1.981 m)    Weight:   Wt Readings from Last 1 Encounters:  11/07/19 96.2 kg   Estimated Nutritional Needs:   Kcal:  2400-2600  Protein:  115-125 grams  Fluid:  1.2 L/day  Corrin Parker, MS, RD, LDN RD pager number/after hours weekend pager number on Amion.

## 2019-11-09 NOTE — Progress Notes (Signed)
Patient has been accepted at [facility] on a TTS schedule with a seat time of 11:35am. He needs to arrive to his appointments at 11:15am.  On his first day at the clinic, he needs to arrive at 11:00am to sign papers. The clinic is prepared to start him on Tuesday, 11/13/19 if patient is discharged by Monday, 11/1.  Navigator informed Dr. Moshe Cipro and Attending/Dr. Cyndia Skeeters. Navigator met with patient and wife at bedside to inform them of seat schedule verbally and provide in writing. They are very appreciative and state no questions or needs at this time. Navigator will continue to follow to assist with smooth transition from hospital to outpatient clinic.  Thomas Mcguire, [place] Renal Navigator [phone]

## 2019-11-09 NOTE — Progress Notes (Signed)
PT Cancellation Note  Patient Details Name: Thomas Mcguire MRN: [id number] DOB: [birth year]   Cancelled Treatment:    Reason Eval/Treat Not Completed: Patient declined, no reason specified politely declines due to being super fatigued this morning. Agreeable to PT at least checking in later in the morning. Will attempt to return if time/schedule allow.    Windell Norfolk, DPT, PN1   Supplemental Physical Therapist [facility]    Pager [phone] Acute Rehab Office [phone]

## 2019-11-10 LAB — RENAL FUNCTION PANEL
Albumin: 2.9 g/dL — ABNORMAL LOW (ref 3.5–5.0)
Anion gap: 16 — ABNORMAL HIGH (ref 5–15)
BUN: 125 mg/dL — ABNORMAL HIGH (ref 8–23)
CO2: 21 mmol/L — ABNORMAL LOW (ref 22–32)
Calcium: 7.8 mg/dL — ABNORMAL LOW (ref 8.9–10.3)
Chloride: 99 mmol/L (ref 98–111)
Creatinine, Ser: 7.24 mg/dL — ABNORMAL HIGH (ref 0.61–1.24)
GFR, Estimated: 7 mL/min — ABNORMAL LOW (ref >60)
Glucose, Bld: 147 mg/dL — ABNORMAL HIGH (ref 70–99)
Phosphorus: 5 mg/dL — ABNORMAL HIGH (ref 2.5–4.6)
Potassium: 3.9 mmol/L (ref 3.5–5.1)
Sodium: 136 mmol/L (ref 135–145)

## 2019-11-10 LAB — CBC WITH DIFFERENTIAL/PLATELET
Abs Immature Granulocytes: 0.07 10*3/uL (ref 0.00–0.07)
Basophils Absolute: 0 10*3/uL (ref 0.0–0.1)
Basophils Relative: 0 %
Eosinophils Absolute: 0.1 10*3/uL (ref 0.0–0.5)
Eosinophils Relative: 1 %
HCT: 27.7 % — ABNORMAL LOW (ref 39.0–52.0)
Hemoglobin: 8.6 g/dL — ABNORMAL LOW (ref 13.0–17.0)
Immature Granulocytes: 0 %
Lymphocytes Relative: 58 %
Lymphs Abs: 10.3 10*3/uL — ABNORMAL HIGH (ref 0.7–4.0)
MCH: 26 pg (ref 26.0–34.0)
MCHC: 31 g/dL (ref 30.0–36.0)
MCV: 83.7 fL (ref 80.0–100.0)
Monocytes Absolute: 1 10*3/uL (ref 0.1–1.0)
Monocytes Relative: 5 %
Neutro Abs: 6.3 10*3/uL (ref 1.7–7.7)
Neutrophils Relative %: 36 %
Platelets: 158 10*3/uL (ref 150–400)
RBC: 3.31 MIL/uL — ABNORMAL LOW (ref 4.22–5.81)
RDW: 17.2 % — ABNORMAL HIGH (ref 11.5–15.5)
WBC: 17.5 10*3/uL — ABNORMAL HIGH (ref 4.0–10.5)
nRBC: 0 % (ref 0.0–0.2)

## 2019-11-10 LAB — GLUCOSE, CAPILLARY
Glucose-Capillary: 124 mg/dL — ABNORMAL HIGH (ref 70–99)
Glucose-Capillary: 160 mg/dL — ABNORMAL HIGH (ref 70–99)
Glucose-Capillary: 139 mg/dL — ABNORMAL HIGH (ref 70–99)
Glucose-Capillary: 112 mg/dL — ABNORMAL HIGH (ref 70–99)
Glucose-Capillary: 170 mg/dL — ABNORMAL HIGH (ref 70–99)

## 2019-11-10 LAB — HEPATITIS B SURFACE ANTIBODY, QUANTITATIVE: Hep B S AB Quant (Post): <3.1 m[IU]/mL — ABNORMAL LOW (ref >9.9)

## 2019-11-10 LAB — MAGNESIUM: Magnesium: 2.4 mg/dL (ref 1.7–2.4)

## 2019-11-10 MED ORDER — HEPARIN SODIUM (PORCINE) 1000 UNIT/ML IJ SOLN
INTRAMUSCULAR | Status: AC
  Administered 2019-11-10: 1000 [IU]
  Filled 2019-11-10: qty 4

## 2019-11-10 NOTE — Progress Notes (Signed)
PT Cancellation Note  Patient Details Name: Thomas Mcguire MRN: [id number] DOB: 08-08-1940   Cancelled Treatment:    Reason Eval/Treat Not Completed: Other (comment)   Patient reports RN was just in and dialysis is on their way to get him. He reports he has been walking in the room with the RW on his own (RN informed--she has not seen him up today).   Discussed importance of increasing his activity/walking to make it easier and safer for him to go home (especially as he will have to leave home for dialysis 3x/week). He continued to refuse.    Arby Barrette, PT Pager [phone]    Rexanne Mano 11/10/2019, 11:50 AM

## 2019-11-10 NOTE — Progress Notes (Signed)
   VASCULAR SURGERY ASSESSMENT & PLAN:   END-STAGE RENAL DISEASE: Patient now has a functioning catheter.  If he is still in the hospital we could potentially do his second stage right basilic vein transposition on Tuesday.  If he is discharged before that we can arrange this as an outpatient.   SUBJECTIVE:   No specific complaints.  PHYSICAL EXAM:   Vitals:   11/09/19 1150 11/09/19 1634 11/09/19 2244 11/10/19 0421  BP: (!) 119/49 (!) 127/50 (!) 135/55 (!) 116/40  Pulse: 67 66 70 66  Resp: 18 18 18 20   Temp: 99 F (37.2 C) 98 F (36.7 C) 98.4 F (36.9 C) 98.4 F (36.9 C)  TempSrc: Oral Oral Oral Oral  SpO2: 98% 95% 94% 91%   Excellent thrill in right upper arm fistula.  (For stage basilic vein transposition)  LABS:   Lab Results  Component Value Date   WBC 17.5 (H) 11/10/2019   HGB 8.6 (L) 11/10/2019   HCT 27.7 (L) 11/10/2019   MCV 83.7 11/10/2019   PLT 158 11/10/2019   Lab Results  Component Value Date   CREATININE 7.24 (H) 11/10/2019    CBG (last 3)  Recent Labs    11/09/19 2109 11/09/19 2354 11/10/19 0418  GLUCAP 215* 151* 124*    PROBLEM LIST:    Active Problems:   Folic acid deficiency   Chronic lymphocytic leukemia (CLL), B-cell (HCC)   CKD (chronic kidney disease)   Fluid overload   Cardiomegaly   DM2 (diabetes mellitus, type 2) (HCC)   CURRENT MEDS:   . calcitRIOL  0.25 mcg Oral Q M,W,F  . Chlorhexidine Gluconate Cloth  6 each Topical Q0600  . Chlorhexidine Gluconate Cloth  6 each Topical Q0600  . [START ON 11/15/2019] darbepoetin (ARANESP) injection - DIALYSIS  100 mcg Intravenous Q Thu-HD  . feeding supplement (NEPRO CARB STEADY)  237 mL Oral BID BM  . insulin aspart  0-6 Units Subcutaneous Q4H  . insulin NPH Human  18 Units Subcutaneous QAC breakfast  . pravastatin  40 mg Oral QHS  . sevelamer carbonate  800 mg Oral TID WC  . sodium chloride flush  3 mL Intravenous Q12H    Deitra Mayo Office: [phone] 11/10/2019

## 2019-11-10 NOTE — Progress Notes (Signed)
PROGRESS NOTE  Thomas Mcguire TGP:[id number] DOB: 1940-03-04   PCP: Leanna Battles, MD  Patient is from: Home  DOA: 11/07/2019 LOS: 3  Chief complaints: Leg swelling  Brief Narrative / Interim history: 79 year old M with history of CLL, CKD-5, DM-2, thrombocytopenia, anemia and HTN directed to ED from vascular surgery office with lower extremity edema and progressive dyspnea on exertion.  He is followed by Dr. Carolin Sicks at [facility].  He was at vascular surgeons office for assessment of his fistula.  In ED, hemodynamically stable.  99% on RA. Na 32. Cr 8.35. BUN 156. Bicarb 17. Ag 17.  WBC 16.7 with lymphocytosis (16.7 on 10/10).  Hgb 8.8 (9.4 on 10/10).  Platelet 142 (slightly lower than baseline).  Influenza and COVID-19 PCR negative.  CXR cardiomegaly with mild CHF.  Nephrology and IR consulted, and admitted for fluid overload and AKI on CKD-5 with azotemia.  Patient had tunneled HD cath on 11/08/2019 and started HD.  Still fluid overloaded.  Soft blood pressures. Possible right basilic vein transposition by vascular surgery on 11/2.    Subjective: Seen and examined earlier this morning.  No major events overnight of this morning.  He says he did not have good sleep but no specific complaints.  Still with dyspnea on exertion but not at rest.  Denies orthopnea.  Still with significant lower extremity edema.  Feels weak.  Objective: Vitals:   11/10/19 0421 11/10/19 0800 11/10/19 0849 11/10/19 1229  BP: (!) 116/40 129/60  (!) 120/49  Pulse: 66 70  65  Resp: 20   16  Temp: 98.4 F (36.9 C)   98.4 F (36.9 C)  TempSrc: Oral   Oral  SpO2: 91% 97%  94%  Weight:   96.2 kg   Height:   6\' 7"  (2.007 m)     Intake/Output Summary (Last 24 hours) at 11/10/2019 1310 Last data filed at 11/10/2019 1235 Gross per 24 hour  Intake 560 ml  Output 300 ml  Net 260 ml   Filed Weights   11/10/19 0849  Weight: 96.2 kg    Examination:  GENERAL: No apparent distress.  Nontoxic. HEENT: MMM.   Vision and hearing grossly intact.  NECK: Supple.  No apparent JVD.  RESP: On room air.  No IWOB.  Fair aeration bilaterally. CVS:  RRR. Heart sounds normal.  ABD/GI/GU: BS+. Abd soft, NTND.  MSK/EXT:  Moves extremities.  2+ pitting edema bilaterally. SKIN: no apparent skin lesion or wound NEURO: Awake, alert and oriented appropriately.  No apparent focal neuro deficit. PSYCH: Calm. Normal affect.   Procedures:  None  Microbiology summarized: COVID-19 PCR negative. Influenza PCR negative.  Assessment & Plan: AKI on CKD-5 with azotemia/bone mineral disorder: Cr 8.35.  BUN 156> 126.  No uremic symptoms. -Tunneled HD catheter on 11/08/2019. First HD on 11/09/2019 -Continue HD with ultrafiltration per nephrology-slow process due to hypotension -Outpatient HD at [facility]. -Second stage right basilic vein transposition by vascular surgery on 11/2 -Possibly home on 11/3.  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  Still with significant fluid overload and some dyspnea.  -Fluid management with HD.  -GDMT-continue holding Coreg and Imdur with soft blood pressures  Anion gap metabolic acidosis: Likely due to azotemia.  Improving. Hyperphosphatemia-likely due to renal failure.  Improving. -Per nephrology.  CLL/leukocytosis/ymphocytosis: Relatively stable. -Outpatient follow-up with hematology.  Controlled DM-2: A1c 6.2%. Recent Labs  Lab 11/09/19 2109 11/09/19  2354 11/10/19 0418 11/10/19 0752 11/10/19 1053  GLUCAP 215* 151* 124* 160* 139*  -Continue Novolin and 25 units before every meal -Continue SSI-renal -Monitor CBG.  Microcytic anemia/folic acid deficiency/anemia of renal disease: Baseline Hgb~10>  8.8 (admit)> 8.6 -Monitor H&H -IV iron and Aranesp per nephrology. -Continue folic acid supplementation  Essential hypertension: Soft blood pressures of home BP  medications. -Continue holding home BP meds  Chronic mild thrombocytopenia: Improved. -Monitor  Goal of care: Significant cardiorenal comorbidities and history of CLL. Still full code, which I believe would pose more harm than benefit -We will discuss this with patient -May need to involve palliative care  Body mass index is 23.88 kg/m. Nutrition Problem: Increased nutrient needs Etiology: chronic illness (CKDV on HD) Signs/Symptoms: estimated needs Interventions: Nepro shake   DVT prophylaxis:  SCDs Start: 11/08/19 0022  Code Status: Full code Family Communication: Patient and/or RN. Available if any question.  Status is: Inpatient  Remains inpatient appropriate because:Hemodynamically unstable, Unsafe d/c plan and Inpatient level of care appropriate due to severity of illness   Dispo: The patient is from: Home              Anticipated d/c is to: Home              Anticipated d/c date is: > 3 days              Patient currently is not medically stable to d/c.       Consultants:  Nephrology IR Vascular surgery   Sch Meds:  Scheduled Meds: . calcitRIOL  0.25 mcg Oral Q M,W,F  . Chlorhexidine Gluconate Cloth  6 each Topical Q0600  . Chlorhexidine Gluconate Cloth  6 each Topical Q0600  . [START ON 11/15/2019] darbepoetin (ARANESP) injection - DIALYSIS  100 mcg Intravenous Q Thu-HD  . feeding supplement (NEPRO CARB STEADY)  237 mL Oral BID BM  . insulin aspart  0-6 Units Subcutaneous Q4H  . insulin NPH Human  18 Units Subcutaneous QAC breakfast  . pravastatin  40 mg Oral QHS  . sevelamer carbonate  800 mg Oral TID WC  . sodium chloride flush  3 mL Intravenous Q12H   Continuous Infusions: . sodium chloride    . ferric gluconate (FERRLECIT/NULECIT) IV 125 mg (11/10/19 0850)   PRN Meds:.sodium chloride, acetaminophen **OR** acetaminophen, HYDROcodone-acetaminophen, ondansetron **OR** ondansetron (ZOFRAN) IV, sodium chloride flush  Antimicrobials: Anti-infectives  (From admission, onward)   Start     Dose/Rate Route Frequency Ordered Stop   11/08/19 1717  ceFAZolin (ANCEF) 2-4 GM/100ML-% IVPB       Note to Pharmacy: Desiree Hane   : cabinet override      11/08/19 1717 11/09/19 0529   11/08/19 0945  ceFAZolin (ANCEF) IVPB 2g/100 mL premix        2 g 200 mL/hr over 30 Minutes Intravenous To Radiology 11/08/19 0855 11/08/19 1754       I have personally reviewed the following labs and images: CBC: Recent Labs  Lab 11/07/19 1902 11/08/19 0445 11/09/19 0300 11/10/19 0057  WBC 16.7* 13.7* 15.4* 17.5*  NEUTROABS 6.0 5.0  --  6.3  HGB 8.8* 8.6* 8.1* 8.6*  HCT 28.3* 27.5* 25.7* 27.7*  MCV 86.8 84.4 83.4 83.7  PLT 142* 132* 131* 158   BMP &GFR Recent Labs  Lab 11/07/19 1902 11/08/19 0445 11/09/19 0300 11/10/19 0057  NA 132* 134* 134* 136  K 4.5 4.3 3.9 3.9  CL 98 99 99 99  CO2 17* 18* 17* 21*  GLUCOSE 151* 129* 136* 147*  BUN 156* 158* 168* 125*  CREATININE 8.35* 8.26* 8.42* 7.24*  CALCIUM 7.6* 7.6* 7.4* 7.8*  MG  --  2.4 2.4 2.4  PHOS  --  6.2* 6.5* 5.0*   Estimated Creatinine Clearance: 11.3 mL/min (A) (by C-G formula based on SCr of 7.24 mg/dL (H)). Liver & Pancreas: Recent Labs  Lab 11/07/19 1902 11/08/19 0445 11/09/19 0300 11/10/19 0057  AST 27 24  --   --   ALT 36 35 33  --   ALKPHOS 64 59  --   --   BILITOT 1.3* 1.0  --   --   PROT 5.9* 5.7*  --   --   ALBUMIN 3.0* 3.0* 2.8* 2.9*   No results for input(s): LIPASE, AMYLASE in the last 168 hours. No results for input(s): AMMONIA in the last 168 hours. Diabetic: Recent Labs    11/07/19 1902  HGBA1C 6.2*   Recent Labs  Lab 11/09/19 2109 11/09/19 2354 11/10/19 0418 11/10/19 0752 11/10/19 1053  GLUCAP 215* 151* 124* 160* 139*   Cardiac Enzymes: No results for input(s): CKTOTAL, CKMB, CKMBINDEX, TROPONINI in the last 168 hours. No results for input(s): PROBNP in the last 8760 hours. Coagulation Profile: Recent Labs  Lab 11/08/19 0759  INR 1.4*    Thyroid Function Tests: Recent Labs    11/08/19 0445  TSH 1.517   Lipid Profile: No results for input(s): CHOL, HDL, LDLCALC, TRIG, CHOLHDL, LDLDIRECT in the last 72 hours. Anemia Panel: Recent Labs    11/08/19 0445  VITAMINB12 2,893*  FOLATE 65.7  FERRITIN 270  TIBC 232*  IRON 28*  RETICCTPCT 2.3   Urine analysis:    Component Value Date/Time   COLORURINE YELLOW 07/11/2018 0802   APPEARANCEUR CLEAR 07/11/2018 0802   LABSPEC 1.009 07/11/2018 0802   PHURINE 6.0 07/11/2018 0802   GLUCOSEU NEGATIVE 07/11/2018 0802   HGBUR NEGATIVE 07/11/2018 0802   BILIRUBINUR NEGATIVE 07/11/2018 0802   KETONESUR NEGATIVE 07/11/2018 0802   PROTEINUR 100 (A) 07/11/2018 0802   NITRITE NEGATIVE 07/11/2018 0802   LEUKOCYTESUR NEGATIVE 07/11/2018 0802   Sepsis Labs: Invalid input(s): PROCALCITONIN, [place]  Microbiology: Recent Results (from the past 240 hour(s))  Respiratory Panel by RT PCR (Flu A&B, Covid) - Nasopharyngeal Swab     Status: None   Collection Time: 11/07/19  9:06 PM   Specimen: Nasopharyngeal Swab  Result Value Ref Range Status   SARS Coronavirus 2 by RT PCR NEGATIVE NEGATIVE Final    Comment: (NOTE) SARS-CoV-2 target nucleic acids are NOT DETECTED.  The SARS-CoV-2 RNA is generally detectable in upper respiratoy specimens during the acute phase of infection. The lowest concentration of SARS-CoV-2 viral copies this assay can detect is 131 copies/mL. A negative result does not preclude SARS-Cov-2 infection and should not be used as the sole basis for treatment or other patient management decisions. A negative result may occur with  improper specimen collection/handling, submission of specimen other than nasopharyngeal swab, presence of viral mutation(s) within the areas targeted by this assay, and inadequate number of viral copies (<131 copies/mL). A negative result must be combined with clinical observations, patient history, and epidemiological information.  The expected result is Negative.  Fact Sheet for Patients:  PinkCheek.be  Fact Sheet for Healthcare Providers:  GravelBags.it  This test is no t yet approved or cleared by the [place] FDA and  has been authorized for detection and/or diagnosis of SARS-CoV-2 by FDA under an Emergency Use Authorization (EUA). This EUA will  remain  in effect (meaning this test can be used) for the duration of the COVID-19 declaration under Section 564(b)(1) of the Act, 21 U.S.C. section 360bbb-3(b)(1), unless the authorization is terminated or revoked sooner.     Influenza A by PCR NEGATIVE NEGATIVE Final   Influenza B by PCR NEGATIVE NEGATIVE Final    Comment: (NOTE) The Xpert Xpress SARS-CoV-2/FLU/RSV assay is intended as an aid in  the diagnosis of influenza from Nasopharyngeal swab specimens and  should not be used as a sole basis for treatment. Nasal washings and  aspirates are unacceptable for Xpert Xpress SARS-CoV-2/FLU/RSV  testing.  Fact Sheet for Patients: PinkCheek.be  Fact Sheet for Healthcare Providers: GravelBags.it  This test is not yet approved or cleared by the [place] FDA and  has been authorized for detection and/or diagnosis of SARS-CoV-2 by  FDA under an Emergency Use Authorization (EUA). This EUA will remain  in effect (meaning this test can be used) for the duration of the  Covid-19 declaration under Section 564(b)(1) of the Act, 21  U.S.C. section 360bbb-3(b)(1), unless the authorization is  terminated or revoked. Performed at [facility], [place] [address], [place], [place] [address]     Radiology Studies: No results found.   Luda Charbonneau T. [place]  If 7PM-7AM, please contact night-coverage [email or web address] 11/10/2019, 1:10 PM

## 2019-11-10 NOTE — Progress Notes (Signed)
Subjective:  No major c/o's-  Still quite dyspneic with activity - also 300 of uop  Objective Vital signs in last 24 hours: Vitals:   11/09/19 2244 11/10/19 0421 11/10/19 0800 11/10/19 0849  BP: (!) 135/55 (!) 116/40 129/60   Pulse: 70 66 70   Resp: 18 20    Temp: 98.4 F (36.9 C) 98.4 F (36.9 C)    TempSrc: Oral Oral    SpO2: 94% 91% 97%   Weight:    96.2 kg  Height:    6\' 7"  (2.007 m)   Weight change:   Intake/Output Summary (Last 24 hours) at 11/10/2019 1138 Last data filed at 11/10/2019 0720 Gross per 24 hour  Intake 460 ml  Output 300 ml  Net 160 ml    Assessment/Plan: 79 year old BM  With DM, HTN and progressive CKD-  Now is time to initiate dialysis  1.Renal- advanced CKD for some time-  Now with BUN 158 and some FTT in addition to volume overload - consents to start HD.  Have consulted IR to place [facility] followed by first HD early 10/29,   second today and third on Monday.  Our team has been activated to get OP HD spot and OP HD education started - has spot at [place].  Vascular on board for his second stage BVT-  Pt does not feel well enough to go home yet, wants more fluid off-  Timeline might be HD on Monday, second stage BVT on Tuesday, then discharge either Tuesday or Wed and start as OP on Thursday ? 2. Hypertension/volume  - very overloaded-  Given lasix - not much impact- stopped-  UF as able with HD, trying for 2 liters today-  Will be slow process.  Hold OP BP meds for now to give [place] a pressure to work with to pull volume 3. Anemia  - giving ESA  iron stores low, repleting as well 4. Bones- continue vitamin D and renvela-  Vitamin D ultimate regimen will depend on the clinic that he goes to     [place]: Basic Metabolic Panel: Recent Labs  Lab 11/08/19 0445 11/09/19 0300 11/10/19 0057  NA 134* 134* 136  K 4.3 3.9 3.9  CL 99 99 99  CO2 18* 17* 21*  GLUCOSE 129* 136* 147*  BUN 158* 168* 125*  CREATININE 8.26* 8.42* 7.24*   CALCIUM 7.6* 7.4* 7.8*  PHOS 6.2* 6.5* 5.0*   Liver Function Tests: Recent Labs  Lab 11/07/19 1902 11/07/19 1902 11/08/19 0445 11/09/19 0300 11/10/19 0057  AST 27  --  24  --   --   ALT 36  --  35 33  --   ALKPHOS 64  --  59  --   --   BILITOT 1.3*  --  1.0  --   --   PROT 5.9*  --  5.7*  --   --   ALBUMIN 3.0*   < > 3.0* 2.8* 2.9*   < > = values in this interval not displayed.   No results for input(s): LIPASE, AMYLASE in the last 168 hours. No results for input(s): AMMONIA in the last 168 hours. CBC: Recent Labs  Lab 11/07/19 1902 11/07/19 1902 11/08/19 0445 11/09/19 0300 11/10/19 0057  WBC 16.7*   < > 13.7* 15.4* 17.5*  NEUTROABS 6.0  --  5.0  --  6.3  HGB 8.8*   < > 8.6* 8.1* 8.6*  HCT 28.3*   < >  27.5* 25.7* 27.7*  MCV 86.8  --  84.4 83.4 83.7  PLT 142*   < > 132* 131* 158   < > = values in this interval not displayed.   Cardiac Enzymes: No results for input(s): CKTOTAL, CKMB, CKMBINDEX, TROPONINI in the last 168 hours. CBG: Recent Labs  Lab 11/09/19 2109 11/09/19 2354 11/10/19 0418 11/10/19 0752 11/10/19 1053  GLUCAP 215* 151* 124* 160* 139*    Iron Studies:  Recent Labs    11/08/19 0445  IRON 28*  TIBC 232*  FERRITIN 270   Studies/Results: No results found. Medications: Infusions: . sodium chloride    . ferric gluconate (FERRLECIT/NULECIT) IV 125 mg (11/10/19 0850)    Scheduled Medications: . calcitRIOL  0.25 mcg Oral Q M,W,F  . Chlorhexidine Gluconate Cloth  6 each Topical Q0600  . Chlorhexidine Gluconate Cloth  6 each Topical Q0600  . [START ON 11/15/2019] darbepoetin (ARANESP) injection - DIALYSIS  100 mcg Intravenous Q Thu-HD  . feeding supplement (NEPRO CARB STEADY)  237 mL Oral BID BM  . insulin aspart  0-6 Units Subcutaneous Q4H  . insulin NPH Human  18 Units Subcutaneous QAC breakfast  . pravastatin  40 mg Oral QHS  . sevelamer carbonate  800 mg Oral TID WC  . sodium chloride flush  3 mL Intravenous Q12H    have reviewed  scheduled and prn medications.  Physical Exam: General: NAD-  Sob upon activity  Heart: RRR Lungs: mostly clear Abdomen: distended- non tender Extremities: 2+ pitting edema Dialysis Access: right sided TDC-  Also with right AVF s/p first stage- due for second stage - good thrill and bruit    11/10/2019,11:38 AM  LOS: 3 days

## 2019-11-11 LAB — HEMOGLOBIN AND HEMATOCRIT, BLOOD
HCT: 26.4 % — ABNORMAL LOW (ref 39.0–52.0)
Hemoglobin: 8.2 g/dL — ABNORMAL LOW (ref 13.0–17.0)

## 2019-11-11 LAB — RENAL FUNCTION PANEL
Albumin: 2.7 g/dL — ABNORMAL LOW (ref 3.5–5.0)
Anion gap: 13 (ref 5–15)
BUN: 73 mg/dL — ABNORMAL HIGH (ref 8–23)
CO2: 25 mmol/L (ref 22–32)
Calcium: 7.7 mg/dL — ABNORMAL LOW (ref 8.9–10.3)
Chloride: 97 mmol/L — ABNORMAL LOW (ref 98–111)
Creatinine, Ser: 5.55 mg/dL — ABNORMAL HIGH (ref 0.61–1.24)
GFR, Estimated: 10 mL/min — ABNORMAL LOW (ref >60)
Glucose, Bld: 147 mg/dL — ABNORMAL HIGH (ref 70–99)
Phosphorus: 3.6 mg/dL (ref 2.5–4.6)
Potassium: 3.8 mmol/L (ref 3.5–5.1)
Sodium: 135 mmol/L (ref 135–145)

## 2019-11-11 LAB — GLUCOSE, CAPILLARY
Glucose-Capillary: 153 mg/dL — ABNORMAL HIGH (ref 70–99)
Glucose-Capillary: 124 mg/dL — ABNORMAL HIGH (ref 70–99)
Glucose-Capillary: 138 mg/dL — ABNORMAL HIGH (ref 70–99)
Glucose-Capillary: 133 mg/dL — ABNORMAL HIGH (ref 70–99)
Glucose-Capillary: 139 mg/dL — ABNORMAL HIGH (ref 70–99)

## 2019-11-11 LAB — MAGNESIUM: Magnesium: 2.1 mg/dL (ref 1.7–2.4)

## 2019-11-11 MED ORDER — INSULIN ASPART 100 UNIT/ML ~~LOC~~ SOLN
0.0000 [IU] | Freq: Three times a day (TID) | SUBCUTANEOUS | Status: DC
  Administered 2019-11-12: 1 [IU] via SUBCUTANEOUS
  Administered 2019-11-13 – 2019-11-14 (×2): 3 [IU] via SUBCUTANEOUS

## 2019-11-11 MED ORDER — DOXERCALCIFEROL 4 MCG/2ML IV SOLN
2.0000 ug | INTRAVENOUS | Status: DC
  Filled 2019-11-11 (×2): qty 2

## 2019-11-11 MED ORDER — CHLORHEXIDINE GLUCONATE CLOTH 2 % EX PADS
6.0000 | MEDICATED_PAD | Freq: Every day | CUTANEOUS | Status: DC
  Administered 2019-11-12 – 2019-11-14 (×3): 6 via TOPICAL

## 2019-11-11 NOTE — Progress Notes (Signed)
Subjective:  HD yest-  Removed almost 2 liters, tolerated well.  No major c/o's-  Still quite dyspneic with activity-  Appreciate vascular-  Planning for second stage BVT on 11/2  Objective Vital signs in last 24 hours: Vitals:   11/10/19 1733 11/10/19 2346 11/11/19 0521 11/11/19 0824  BP: (!) 133/50 (!) 107/56 (!) 117/47 (!) 115/50  Pulse: 76 76 72 70  Resp: 16 18 20 17   Temp: 98.6 F (37 C) (!) 97.5 F (36.4 C) 98.2 F (36.8 C)   TempSrc: Oral Oral Oral   SpO2: 95% 95% 93% 94%  Weight:      Height:       Weight change:   Intake/Output Summary (Last 24 hours) at 11/11/2019 1108 Last data filed at 11/11/2019 3614 Gross per 24 hour  Intake 820 ml  Output 1994 ml  Net -1174 ml    Assessment/Plan: 79 year old BM  With DM, HTN and progressive CKD-  Now is time to initiate dialysis  1.Renal- advanced CKD for some time-  Now with BUN 158 and some FTT in addition to volume overload - consents to start HD.  Have consulted IR to place [facility] followed by first HD early 10/29,   second 10/30 and third tomorrow 11/1.  Our team has been activated to get OP HD spot and OP HD education started - has spot at [place].  Vascular on board for his second stage BVT to be done 11/2-  Pt does not feel well enough to go home yet, wants more fluid off-  Timeline might be HD on Monday, second stage BVT on Tuesday, then discharge either Tuesday or Wed and start as OP on Thursday  2. Hypertension/volume  - very overloaded-  Given lasix - not much impact- stopped-  UF as able with HD, will try for 2-3 liters with HD on Monday-  Will be slow process.  Hold OP BP meds for now to give [place] a pressure to work with to pull volume 3. Anemia  - giving ESA  iron stores low, repleting as well 4. Bones- continue vitamin D and renvela-  Vitamin D- at [facility] they use hectorol     Louis Meckel    Labs: Basic Metabolic Panel: Recent Labs  Lab 11/09/19 0300 11/10/19 0057 11/11/19 0154  NA 134* 136 135   K 3.9 3.9 3.8  CL 99 99 97*  CO2 17* 21* 25  GLUCOSE 136* 147* 147*  BUN 168* 125* 73*  CREATININE 8.42* 7.24* 5.55*  CALCIUM 7.4* 7.8* 7.7*  PHOS 6.5* 5.0* 3.6   Liver Function Tests: Recent Labs  Lab 11/07/19 1902 11/07/19 1902 11/08/19 0445 11/08/19 0445 11/09/19 0300 11/10/19 0057 11/11/19 0154  AST 27  --  24  --   --   --   --   ALT 36  --  35  --  33  --   --   ALKPHOS 64  --  59  --   --   --   --   BILITOT 1.3*  --  1.0  --   --   --   --   PROT 5.9*  --  5.7*  --   --   --   --   ALBUMIN 3.0*   < > 3.0*   < > 2.8* 2.9* 2.7*   < > = values in this interval not displayed.   No results for input(s): LIPASE, AMYLASE in the last 168 hours. No results  for input(s): AMMONIA in the last 168 hours. CBC: Recent Labs  Lab 11/07/19 1902 11/07/19 1902 11/08/19 0445 11/08/19 0445 11/09/19 0300 11/10/19 0057 11/11/19 0154  WBC 16.7*   < > 13.7*  --  15.4* 17.5*  --   NEUTROABS 6.0  --  5.0  --   --  6.3  --   HGB 8.8*   < > 8.6*   < > 8.1* 8.6* 8.2*  HCT 28.3*   < > 27.5*   < > 25.7* 27.7* 26.4*  MCV 86.8  --  84.4  --  83.4 83.7  --   PLT 142*   < > 132*  --  131* 158  --    < > = values in this interval not displayed.   Cardiac Enzymes: No results for input(s): CKTOTAL, CKMB, CKMBINDEX, TROPONINI in the last 168 hours. CBG: Recent Labs  Lab 11/10/19 1053 11/10/19 1731 11/10/19 1949 11/11/19 0106 11/11/19 0402  GLUCAP 139* 112* 170* 153* 124*    Iron Studies:  No results for input(s): IRON, TIBC, TRANSFERRIN, FERRITIN in the last 72 hours. Studies/Results: No results found. Medications: Infusions: . sodium chloride    . ferric gluconate (FERRLECIT/NULECIT) IV 125 mg (11/11/19 0831)    Scheduled Medications: . calcitRIOL  0.25 mcg Oral Q M,W,F  . Chlorhexidine Gluconate Cloth  6 each Topical Q0600  . Chlorhexidine Gluconate Cloth  6 each Topical Q0600  . [START ON 11/15/2019] darbepoetin (ARANESP) injection - DIALYSIS  100 mcg Intravenous Q Thu-HD   . feeding supplement (NEPRO CARB STEADY)  237 mL Oral BID BM  . insulin aspart  0-6 Units Subcutaneous TID AC & HS  . insulin NPH Human  18 Units Subcutaneous QAC breakfast  . pravastatin  40 mg Oral QHS  . sevelamer carbonate  800 mg Oral TID WC  . sodium chloride flush  3 mL Intravenous Q12H    have reviewed scheduled and prn medications.  Physical Exam: General: NAD-  Sob upon activity  Heart: RRR Lungs: mostly clear Abdomen: distended- non tender Extremities: 2+ pitting edema Dialysis Access: right sided TDC-  Also with right AVF s/p first stage- due for second stage - good thrill and bruit    11/11/2019,11:08 AM  LOS: 4 days

## 2019-11-11 NOTE — Progress Notes (Signed)
PROGRESS NOTE  Thomas Mcguire IWL:[id number] DOB: [birth year]   PCP: Leanna Battles, MD  Patient is from: Home  DOA: 11/07/2019 LOS: 4  Chief complaints: Leg swelling  Brief Narrative / Interim history: 79 year old M with history of CLL, CKD-5, DM-2, thrombocytopenia, anemia and HTN directed to ED from vascular surgery office with lower extremity edema and progressive dyspnea on exertion.  He is followed by Dr. Carolin Sicks at [facility].  He was at vascular surgeons office for assessment of his fistula.  In ED, hemodynamically stable.  99% on RA. Na 32. Cr 8.35. BUN 156. Bicarb 17. Ag 17.  WBC 16.7 with lymphocytosis (16.7 on 10/10).  Hgb 8.8 (9.4 on 10/10).  Platelet 142 (slightly lower than baseline).  Influenza and COVID-19 PCR negative.  CXR cardiomegaly with mild CHF.  Nephrology and IR consulted, and admitted for fluid overload and AKI on CKD-5 with azotemia.  Patient had tunneled HD cath on 11/08/2019 and started HD.  Still fluid overloaded.  Soft blood pressures. Plan for right basilic vein transposition by vascular surgery on 11/2.    Subjective: Seen and examined earlier this morning.  No major events overnight of this morning.  Still with dyspnea on exertion but not at rest.  No other complaints.  He denies chest pain, GI or UTI symptoms.  Objective: Vitals:   11/10/19 2346 11/11/19 0521 11/11/19 0824 11/11/19 1242  BP: (!) 107/56 (!) 117/47 (!) 115/50 (!) 122/40  Pulse: 76 72 70 70  Resp: 18 20 17 16   Temp: (!) 97.5 F (36.4 C) 98.2 F (36.8 C)  98.6 F (37 C)  TempSrc: Oral Oral  Oral  SpO2: 95% 93% 94% 98%  Weight:      Height:        Intake/Output Summary (Last 24 hours) at 11/11/2019 1328 Last data filed at 11/11/2019 1230 Gross per 24 hour  Intake 1070 ml  Output 1994 ml  Net -924 ml   Filed Weights   11/10/19 0849 11/10/19 1345  Weight: 96.2 kg 100.5 kg    Examination:  GENERAL: No apparent distress.  Nontoxic. HEENT: MMM.  Vision and hearing grossly  intact.  NECK: Supple.  No apparent JVD.  RESP:  No IWOB.  Fair aeration bilaterally. CVS:  RRR. Heart sounds normal.  ABD/GI/GU: BS+. Abd soft, NTND.  MSK/EXT:  Moves extremities. 1+ pitting edema bilaterally.  Status post toe amputations bilaterally. SKIN: no apparent skin lesion or wound NEURO: Awake, alert and oriented appropriately.  No apparent focal neuro deficit. PSYCH: Calm. Normal affect.   Procedures:  None  Microbiology summarized: COVID-19 PCR negative. Influenza PCR negative.  Assessment & Plan: AKI on CKD-5 with azotemia/bone mineral disorder: Cr 8.35.  BUN 156> 126.  No uremic symptoms. -Tunneled HD catheter on 11/08/2019. First HD on 11/09/2019 -HD per nephrology -Outpatient HD at [place]. -Second right basilic vein transposition by vascular surgery on 11/2 -Possibly home on 11/3.  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  Still with significant fluid overload and some dyspnea.  -Fluid management with HD.  -GDMT-continue holding Coreg and Imdur with soft blood pressures  Anion gap metabolic acidosis: Likely due to azotemia.  Resolved. Hyperphosphatemia-likely due to renal failure.  Resolved. -Per nephrology.  CLL/leukocytosis/ymphocytosis: Relatively stable. -Outpatient follow-up with hematology.  Controlled DM-2: A1c 6.2%. Recent Labs  Lab 11/10/19 1731 11/10/19 1949 11/11/19 0106 11/11/19 0402 11/11/19 1112  GLUCAP 112* 170* 153*  124* 138*  -Continue Novolin and 25 units before every meal -Continue SSI-renal -Monitor CBG.  Microcytic anemia/folic acid deficiency/anemia of renal disease: B/l Hgb~10>  8.8 (admit)> 8.6> 8.2 -Monitor H&H -IV iron and Aranesp per nephrology. -Continue folic acid supplementation  Essential hypertension: Soft DBP -Continue holding home BP meds  Chronic mild thrombocytopenia: Improved. -Monitor  Goal of care:  Significant cardiorenal comorbidities and history of CLL. Still full code -Needs discussion.  Nutrition Body mass index is 24.96 kg/m. Nutrition Problem: Increased nutrient needs Etiology: chronic illness (CKDV on HD) Signs/Symptoms: estimated needs Interventions: Nepro shake   DVT prophylaxis:  SCDs Start: 11/08/19 0022  Code Status: Full code Family Communication: Patient and/or RN. Available if any question.  Status is: Inpatient  Remains inpatient appropriate because:Hemodynamically unstable, Unsafe d/c plan and Inpatient level of care appropriate due to severity of illness   Dispo: The patient is from: Home              Anticipated d/c is to: Home              Anticipated d/c date is: > 3 days              Patient currently is not medically stable to d/c.       Consultants:  Nephrology IR Vascular surgery   Sch Meds:  Scheduled Meds:  [START ON 11/12/2019] Chlorhexidine Gluconate Cloth  6 each Topical Q0600   [START ON 11/15/2019] darbepoetin (ARANESP) injection - DIALYSIS  100 mcg Intravenous Q Thu-HD   [START ON 11/12/2019] doxercalciferol  2 mcg Intravenous Q M,W,F-HD   feeding supplement (NEPRO CARB STEADY)  237 mL Oral BID BM   insulin aspart  0-6 Units Subcutaneous TID AC & HS   insulin NPH Human  18 Units Subcutaneous QAC breakfast   pravastatin  40 mg Oral QHS   sevelamer carbonate  800 mg Oral TID WC   sodium chloride flush  3 mL Intravenous Q12H   Continuous Infusions:  sodium chloride     ferric gluconate (FERRLECIT/NULECIT) IV 125 mg (11/11/19 0831)   PRN Meds:.sodium chloride, acetaminophen **OR** acetaminophen, HYDROcodone-acetaminophen, ondansetron **OR** ondansetron (ZOFRAN) IV, sodium chloride flush  Antimicrobials: Anti-infectives (From admission, onward)   Start     Dose/Rate Route Frequency Ordered Stop   11/08/19 1717  ceFAZolin (ANCEF) 2-4 GM/100ML-% IVPB       Note to Pharmacy: Desiree Hane   : cabinet override       11/08/19 1717 11/09/19 0529   11/08/19 0945  ceFAZolin (ANCEF) IVPB 2g/100 mL premix        2 g 200 mL/hr over 30 Minutes Intravenous To Radiology 11/08/19 0855 11/08/19 1754       I have personally reviewed the following labs and images: CBC: Recent Labs  Lab 11/07/19 1902 11/08/19 0445 11/09/19 0300 11/10/19 0057 11/11/19 0154  WBC 16.7* 13.7* 15.4* 17.5*  --   NEUTROABS 6.0 5.0  --  6.3  --   HGB 8.8* 8.6* 8.1* 8.6* 8.2*  HCT 28.3* 27.5* 25.7* 27.7* 26.4*  MCV 86.8 84.4 83.4 83.7  --   PLT 142* 132* 131* 158  --    BMP &GFR Recent Labs  Lab 11/07/19 1902 11/08/19 0445 11/09/19 0300 11/10/19 0057 11/11/19 0154  NA 132* 134* 134* 136 135  K 4.5 4.3 3.9 3.9 3.8  CL 98 99 99 99 97*  CO2 17* 18* 17* 21* 25  GLUCOSE 151* 129* 136* 147* 147*  BUN 156* 158* 168* 125*  73*  CREATININE 8.35* 8.26* 8.42* 7.24* 5.55*  CALCIUM 7.6* 7.6* 7.4* 7.8* 7.7*  MG  --  2.4 2.4 2.4 2.1  PHOS  --  6.2* 6.5* 5.0* 3.6   Estimated Creatinine Clearance: 14.8 mL/min (A) (by C-G formula based on SCr of 5.55 mg/dL (H)). Liver & Pancreas: Recent Labs  Lab 11/07/19 1902 11/08/19 0445 11/09/19 0300 11/10/19 0057 11/11/19 0154  AST 27 24  --   --   --   ALT 36 35 33  --   --   ALKPHOS 64 59  --   --   --   BILITOT 1.3* 1.0  --   --   --   PROT 5.9* 5.7*  --   --   --   ALBUMIN 3.0* 3.0* 2.8* 2.9* 2.7*   No results for input(s): LIPASE, AMYLASE in the last 168 hours. No results for input(s): AMMONIA in the last 168 hours. Diabetic: No results for input(s): HGBA1C in the last 72 hours. Recent Labs  Lab 11/10/19 1731 11/10/19 1949 11/11/19 0106 11/11/19 0402 11/11/19 1112  GLUCAP 112* 170* 153* 124* 138*   Cardiac Enzymes: No results for input(s): CKTOTAL, CKMB, CKMBINDEX, TROPONINI in the last 168 hours. No results for input(s): PROBNP in the last 8760 hours. Coagulation Profile: Recent Labs  Lab 11/08/19 0759  INR 1.4*   Thyroid Function Tests: No results for input(s):  TSH, T4TOTAL, FREET4, T3FREE, THYROIDAB in the last 72 hours. Lipid Profile: No results for input(s): CHOL, HDL, LDLCALC, TRIG, CHOLHDL, LDLDIRECT in the last 72 hours. Anemia Panel: No results for input(s): VITAMINB12, FOLATE, FERRITIN, TIBC, IRON, RETICCTPCT in the last 72 hours. Urine analysis:    Component Value Date/Time   COLORURINE YELLOW 07/11/2018 0802   APPEARANCEUR CLEAR 07/11/2018 0802   LABSPEC 1.009 07/11/2018 0802   PHURINE 6.0 07/11/2018 0802   GLUCOSEU NEGATIVE 07/11/2018 0802   HGBUR NEGATIVE 07/11/2018 0802   BILIRUBINUR NEGATIVE 07/11/2018 0802   KETONESUR NEGATIVE 07/11/2018 0802   PROTEINUR 100 (A) 07/11/2018 0802   NITRITE NEGATIVE 07/11/2018 0802   LEUKOCYTESUR NEGATIVE 07/11/2018 0802   Sepsis Labs: Invalid input(s): PROCALCITONIN, [place]  Microbiology: Recent Results (from the past 240 hour(s))  Respiratory Panel by RT PCR (Flu A&B, Covid) - Nasopharyngeal Swab     Status: None   Collection Time: 11/07/19  9:06 PM   Specimen: Nasopharyngeal Swab  Result Value Ref Range Status   SARS Coronavirus 2 by RT PCR NEGATIVE NEGATIVE Final    Comment: (NOTE) SARS-CoV-2 target nucleic acids are NOT DETECTED.  The SARS-CoV-2 RNA is generally detectable in upper respiratoy specimens during the acute phase of infection. The lowest concentration of SARS-CoV-2 viral copies this assay can detect is 131 copies/mL. A negative result does not preclude SARS-Cov-2 infection and should not be used as the sole basis for treatment or other patient management decisions. A negative result may occur with  improper specimen collection/handling, submission of specimen other than nasopharyngeal swab, presence of viral mutation(s) within the areas targeted by this assay, and inadequate number of viral copies (<131 copies/mL). A negative result must be combined with clinical observations, patient history, and epidemiological information. The expected result is  Negative.  Fact Sheet for Patients:  PinkCheek.be  Fact Sheet for Healthcare Providers:  GravelBags.it  This test is no t yet approved or cleared by the [place] FDA and  has been authorized for detection and/or diagnosis of SARS-CoV-2 by FDA under an Emergency Use Authorization (EUA). This EUA will  remain  in effect (meaning this test can be used) for the duration of the COVID-19 declaration under Section 564(b)(1) of the Act, 21 U.S.C. section 360bbb-3(b)(1), unless the authorization is terminated or revoked sooner.     Influenza A by PCR NEGATIVE NEGATIVE Final   Influenza B by PCR NEGATIVE NEGATIVE Final    Comment: (NOTE) The Xpert Xpress SARS-CoV-2/FLU/RSV assay is intended as an aid in  the diagnosis of influenza from Nasopharyngeal swab specimens and  should not be used as a sole basis for treatment. Nasal washings and  aspirates are unacceptable for Xpert Xpress SARS-CoV-2/FLU/RSV  testing.  Fact Sheet for Patients: PinkCheek.be  Fact Sheet for Healthcare Providers: GravelBags.it  This test is not yet approved or cleared by the [place] FDA and  has been authorized for detection and/or diagnosis of SARS-CoV-2 by  FDA under an Emergency Use Authorization (EUA). This EUA will remain  in effect (meaning this test can be used) for the duration of the  Covid-19 declaration under Section 564(b)(1) of the Act, 21  U.S.C. section 360bbb-3(b)(1), unless the authorization is  terminated or revoked. Performed at [facility], [place] [address] [place], [place] [address]     Radiology Studies: No results found.   Beola Vasallo T. [place]  If 7PM-7AM, please contact night-coverage [email or web address] 11/11/2019, 1:28 PM

## 2019-11-11 NOTE — Progress Notes (Signed)
   VASCULAR SURGERY ASSESSMENT & PLAN:   END-STAGE RENAL DISEASE: Now has a functioning catheter.  I have scheduled him for a right second stage basilic vein transposition on Tuesday with Dr. Donnetta Hutching.  I have discussed this with the patient.  RENAL: Please do not dialyze patient on Tuesday as he is scheduled for surgery.  Thank you.  SUBJECTIVE:   No complaints  PHYSICAL EXAM:   Vitals:   11/10/19 1700 11/10/19 1733 11/10/19 2346 11/11/19 0521  BP: (!) 124/52 (!) 133/50 (!) 107/56 (!) 117/47  Pulse:  76 76 72  Resp: (!) 25 16 18 20   Temp: 98.6 F (37 C) 98.6 F (37 C) (!) 97.5 F (36.4 C) 98.2 F (36.8 C)  TempSrc: Oral Oral Oral Oral  SpO2:  95% 95% 93%  Weight:      Height:       Excellent thrill in the right upper arm fistula  LABS:   Lab Results  Component Value Date   WBC 17.5 (H) 11/10/2019   HGB 8.2 (L) 11/11/2019   HCT 26.4 (L) 11/11/2019   MCV 83.7 11/10/2019   PLT 158 11/10/2019   Lab Results  Component Value Date   CREATININE 5.55 (H) 11/11/2019   Lab Results  Component Value Date   INR 1.4 (H) 11/08/2019   CBG (last 3)  Recent Labs    11/10/19 1949 11/11/19 0106 11/11/19 0402  GLUCAP 170* 153* 124*    PROBLEM LIST:    Active Problems:   Folic acid deficiency   Chronic lymphocytic leukemia (CLL), B-cell (HCC)   CKD (chronic kidney disease)   Fluid overload   Cardiomegaly   DM2 (diabetes mellitus, type 2) (HCC)   CURRENT MEDS:   . calcitRIOL  0.25 mcg Oral Q M,W,F  . Chlorhexidine Gluconate Cloth  6 each Topical Q0600  . Chlorhexidine Gluconate Cloth  6 each Topical Q0600  . [START ON 11/15/2019] darbepoetin (ARANESP) injection - DIALYSIS  100 mcg Intravenous Q Thu-HD  . feeding supplement (NEPRO CARB STEADY)  237 mL Oral BID BM  . insulin aspart  0-6 Units Subcutaneous Q4H  . insulin NPH Human  18 Units Subcutaneous QAC breakfast  . pravastatin  40 mg Oral QHS  . sevelamer carbonate  800 mg Oral TID WC  . sodium chloride flush  3  mL Intravenous Q12H    Deitra Mayo Office: [phone] 11/11/2019

## 2019-11-11 NOTE — Progress Notes (Signed)
Physical Therapy Treatment Patient Details Name: Thomas Mcguire MRN: [id number] DOB: 1940-04-21 Today's Date: 11/11/2019    History of Present Illness Pt is a 79 y/o male with PMH of CLL, anemia, CKD stage V, DM2, HTN, thrombocytopenia, presenting with increased leg swelling, fluid overload, progressive dyspnea. Admitted for fluid overload in setting of decompensated CKD stage V.     PT Comments    Patient reports feeling worn out and tired after dialysis yesterday. Requires some coaxing to participate in therapy today. Tolerated short distance ambulation with Min guard assist for safety and use of RW for support. Pt self limiting activity due to fatigue. Sp02 ranged from 85-94% on RA during activity/session. Sp02 improved post mobility. Encouraged increasing activity while in the hospital especially on non dialysis days. Will follow.   Follow Up Recommendations  Outpatient PT;Supervision for mobility/OOB     Equipment Recommendations  Rolling walker with 5" wheels    Recommendations for Other Services       Precautions / Restrictions Precautions Precautions: Fall;Other (comment) Precaution Comments: watch 02 Restrictions Weight Bearing Restrictions: No    Mobility  Bed Mobility Overal bed mobility: Needs Assistance Bed Mobility: Supine to Sit     Supine to sit: Supervision;HOB elevated     General bed mobility comments: HOB elevated with increased time but no physical assist required. Use of rail.  Transfers Overall transfer level: Needs assistance Equipment used: Rolling walker (2 wheeled) Transfers: Sit to/from Stand Sit to Stand: Min guard;From elevated surface         General transfer comment: increased time, cueing for hand placement and safety; min guard to steady during transition. Slow to rise.  Ambulation/Gait Ambulation/Gait assistance: Min guard Gait Distance (Feet): 24 Feet Assistive device: Rolling walker (2 wheeled) Gait Pattern/deviations:  Step-through pattern;Decreased stride length;Trunk flexed Gait velocity: decreased   General Gait Details: Slow, mildly unsteady gait with RW for support, fatigues quickly. Self limiting distance. Sp02 ranging from 85-94% on RA.   Stairs             Wheelchair Mobility    Modified Rankin (Stroke Patients Only)       Balance Overall balance assessment: Needs assistance Sitting-balance support: Feet supported;No upper extremity supported Sitting balance-Leahy Scale: Fair     Standing balance support: During functional activity Standing balance-Leahy Scale: Poor Standing balance comment: preference to UE support                             Cognition Arousal/Alertness: Awake/alert Behavior During Therapy: WFL for tasks assessed/performed Overall Cognitive Status: Within Functional Limits for tasks assessed                                 General Comments: Appears low energy and with saddened mood.      Exercises      General Comments General comments (skin integrity, edema, etc.): Spouse present during session.      Pertinent Vitals/Pain Pain Assessment: No/denies pain    Home Living                      Prior Function            PT Goals (current goals can now be found in the care plan section) Progress towards PT goals: Progressing toward goals (slowly)    Frequency    Min 3X/week  PT Plan Current plan remains appropriate    Co-evaluation              AM-PAC PT "6 Clicks" Mobility   Outcome Measure  Help needed turning from your back to your side while in a flat bed without using bedrails?: None Help needed moving from lying on your back to sitting on the side of a flat bed without using bedrails?: A Little Help needed moving to and from a bed to a chair (including a wheelchair)?: A Little Help needed standing up from a chair using your arms (e.g., wheelchair or bedside chair)?: A Little Help  needed to walk in hospital room?: A Little Help needed climbing 3-5 steps with a railing? : A Lot 6 Click Score: 18    End of Session Equipment Utilized During Treatment: Gait belt Activity Tolerance: Patient limited by fatigue Patient left: in bed;with call bell/phone within reach;with family/visitor present;with bed alarm set (sitting EOB) Nurse Communication: Mobility status PT Visit Diagnosis: Unsteadiness on feet (R26.81);Difficulty in walking, not elsewhere classified (R26.2);Muscle weakness (generalized) (M62.81)     Time: 5789-7847 PT Time Calculation (min) (ACUTE ONLY): 13 min  Charges:  $Therapeutic Exercise: 8-22 mins                     Marisa Severin, PT, DPT Acute Rehabilitation Services Pager [phone] Office [phone]       [name] 11/11/2019, 2:07 PM

## 2019-11-12 LAB — RENAL FUNCTION PANEL
Albumin: 2.4 g/dL — ABNORMAL LOW (ref 3.5–5.0)
Anion gap: 17 — ABNORMAL HIGH (ref 5–15)
BUN: 86 mg/dL — ABNORMAL HIGH (ref 8–23)
CO2: 20 mmol/L — ABNORMAL LOW (ref 22–32)
Calcium: 6.8 mg/dL — ABNORMAL LOW (ref 8.9–10.3)
Chloride: 95 mmol/L — ABNORMAL LOW (ref 98–111)
Creatinine, Ser: 6.25 mg/dL — ABNORMAL HIGH (ref 0.61–1.24)
GFR, Estimated: 9 mL/min — ABNORMAL LOW (ref >60)
Glucose, Bld: 188 mg/dL — ABNORMAL HIGH (ref 70–99)
Phosphorus: 4.4 mg/dL (ref 2.5–4.6)
Potassium: 3.7 mmol/L (ref 3.5–5.1)
Sodium: 132 mmol/L — ABNORMAL LOW (ref 135–145)

## 2019-11-12 LAB — CBC
HCT: 25.6 % — ABNORMAL LOW (ref 39.0–52.0)
Hemoglobin: 7.8 g/dL — ABNORMAL LOW (ref 13.0–17.0)
MCH: 26 pg (ref 26.0–34.0)
MCHC: 30.5 g/dL (ref 30.0–36.0)
MCV: 85.3 fL (ref 80.0–100.0)
Platelets: 161 10*3/uL (ref 150–400)
RBC: 3 MIL/uL — ABNORMAL LOW (ref 4.22–5.81)
RDW: 17.5 % — ABNORMAL HIGH (ref 11.5–15.5)
WBC: 18 10*3/uL — ABNORMAL HIGH (ref 4.0–10.5)
nRBC: 0 % (ref 0.0–0.2)

## 2019-11-12 LAB — GLUCOSE, CAPILLARY
Glucose-Capillary: 115 mg/dL — ABNORMAL HIGH (ref 70–99)
Glucose-Capillary: 112 mg/dL — ABNORMAL HIGH (ref 70–99)
Glucose-Capillary: 183 mg/dL — ABNORMAL HIGH (ref 70–99)
Glucose-Capillary: 148 mg/dL — ABNORMAL HIGH (ref 70–99)

## 2019-11-12 MED ORDER — CEFAZOLIN SODIUM-DEXTROSE 2-4 GM/100ML-% IV SOLN
2.0000 g | INTRAVENOUS | Status: AC
  Filled 2019-11-12: qty 100

## 2019-11-12 MED ORDER — HEPARIN SODIUM (PORCINE) 1000 UNIT/ML IJ SOLN
INTRAMUSCULAR | Status: AC
  Administered 2019-11-12: 3800 [IU]
  Filled 2019-11-12: qty 4

## 2019-11-12 MED ORDER — DOXERCALCIFEROL 4 MCG/2ML IV SOLN
INTRAVENOUS | Status: AC
  Administered 2019-11-12: 2 ug via INTRAVENOUS
  Filled 2019-11-12: qty 2

## 2019-11-12 NOTE — Procedures (Signed)
I was present at this dialysis session.  Tolerating dialysis with no issues.  I have reviewed the session itself and made appropriate changes.   Filed Weights   11/10/19 1345 11/12/19 0837 11/12/19 0857  Weight: 100.5 kg 100.9 kg 100.9 kg    Recent Labs  Lab 11/11/19 0154  NA 135  K 3.8  CL 97*  CO2 25  GLUCOSE 147*  BUN 73*  CREATININE 5.55*  CALCIUM 7.7*  PHOS 3.6    Recent Labs  Lab 11/07/19 1902 11/07/19 1902 11/08/19 0445 11/08/19 0445 11/09/19 0300 11/10/19 0057 11/11/19 0154  WBC 16.7*   < > 13.7*  --  15.4* 17.5*  --   NEUTROABS 6.0  --  5.0  --   --  6.3  --   HGB 8.8*   < > 8.6*   < > 8.1* 8.6* 8.2*  HCT 28.3*   < > 27.5*   < > 25.7* 27.7* 26.4*  MCV 86.8   < > 84.4  --  83.4 83.7  --   PLT 142*   < > 132*  --  131* 158  --    < > = values in this interval not displayed.    Scheduled Meds: . Chlorhexidine Gluconate Cloth  6 each Topical Q0600  . [START ON 11/15/2019] darbepoetin (ARANESP) injection - DIALYSIS  100 mcg Intravenous Q Thu-HD  . doxercalciferol  2 mcg Intravenous Q M,W,F-HD  . feeding supplement (NEPRO CARB STEADY)  237 mL Oral BID BM  . insulin aspart  0-6 Units Subcutaneous TID AC & HS  . insulin NPH Human  18 Units Subcutaneous QAC breakfast  . pravastatin  40 mg Oral QHS  . sevelamer carbonate  800 mg Oral TID WC  . sodium chloride flush  3 mL Intravenous Q12H   Continuous Infusions: . sodium chloride    . [START ON 11/13/2019]  ceFAZolin (ANCEF) IV    . ferric gluconate (FERRLECIT/NULECIT) IV 125 mg (11/11/19 0831)   PRN Meds:.sodium chloride, acetaminophen **OR** acetaminophen, HYDROcodone-acetaminophen, ondansetron **OR** ondansetron (ZOFRAN) IV, sodium chloride flush   Santiago Bumpers,  MD 11/12/2019, 10:18 AM

## 2019-11-12 NOTE — Progress Notes (Signed)
OT Cancellation Note  Patient Details Name: Thomas Mcguire MRN: [id number] DOB: 06/10/1940   Cancelled Treatment:    Reason Eval/Treat Not Completed: Fatigue/lethargy limiting ability to participate;Other (comment) Pt reports having just returned from HD reporting fatigue and declining OT session. Will check back as time allows.   Lanier Clam., COTA/L Acute Rehabilitation Services [phone] [place] 11/12/2019, 1:34 PM

## 2019-11-12 NOTE — Progress Notes (Signed)
Renal Navigator updated outpatient HD clinic based on notes and has moved patient's tentative clinic start date to Thursday, 11/15/19. Navigator will continue to follow closely.  Alphonzo Cruise, [place] Renal Navigator [phone]

## 2019-11-12 NOTE — Progress Notes (Signed)
VASCULAR SURGERY:  Patient is scheduled for a second stage basilic vein transposition tomorrow with Dr. Donnetta Hutching.  I have previously discussed the procedure and potential complications and he is agreeable to proceed.  As per Dr. Shelva Majestic note, the patient is for dialysis today so this will not interfere with his OR schedule.  Preop orders written.  Deitra Mayo, MD Office: [phone]

## 2019-11-12 NOTE — Progress Notes (Signed)
Subjective: Patient states his breathing seems improved.  No significant complaints.  Seen on HD and tolerating well.  Objective Vital signs in last 24 hours: Vitals:   11/12/19 0842 11/12/19 0857 11/12/19 0910 11/12/19 0940  BP: (!) 113/45  (!) 112/52 (!) 114/50  Pulse:      Resp:   (!) 28 (!) 26  Temp:      TempSrc:      SpO2:      Weight:  100.9 kg    Height:       Weight change:   Intake/Output Summary (Last 24 hours) at 11/12/2019 1019 Last data filed at 11/12/2019 0600 Gross per 24 hour  Intake 953 ml  Output --  Net 953 ml    Assessment/Plan: 79 year old BM  With DM, HTN and progressive CKD-  Now is time to initiate dialysis  1.Renal- advanced CKD for some time-  Now with BUN 158 and some FTT in addition to volume overload - consents to start HD.    Now status post dialysis on 10/29, 10/30.  Undergoing dialysis today with no issues.  Our team has been activated to get OP HD spot and OP HD education started - has spot at [place].  Vascular on board for his second stage BVT to be done 11/2-patient feeling better today.  Possibly can go home after procedure tomorrow versus after dialysis on Wednesday.   2. Hypertension/volume  -volume overloaded.  Improving with dialysis and ultrafiltration. 3. Anemia  - giving ESA  iron stores low, repleting as well 4. Bones- continue vitamin D and renvela-  Vitamin D- at [facility] they use hectorol     Reesa Chew    Labs: Basic Metabolic Panel: Recent Labs  Lab 11/09/19 0300 11/10/19 0057 11/11/19 0154  NA 134* 136 135  K 3.9 3.9 3.8  CL 99 99 97*  CO2 17* 21* 25  GLUCOSE 136* 147* 147*  BUN 168* 125* 73*  CREATININE 8.42* 7.24* 5.55*  CALCIUM 7.4* 7.8* 7.7*  PHOS 6.5* 5.0* 3.6   Liver Function Tests: Recent Labs  Lab 11/07/19 1902 11/07/19 1902 11/08/19 0445 11/08/19 0445 11/09/19 0300 11/10/19 0057 11/11/19 0154  AST 27  --  24  --   --   --   --   ALT 36  --  35  --  33  --   --   ALKPHOS 64  --  59   --   --   --   --   BILITOT 1.3*  --  1.0  --   --   --   --   PROT 5.9*  --  5.7*  --   --   --   --   ALBUMIN 3.0*   < > 3.0*   < > 2.8* 2.9* 2.7*   < > = values in this interval not displayed.   No results for input(s): LIPASE, AMYLASE in the last 168 hours. No results for input(s): AMMONIA in the last 168 hours. CBC: Recent Labs  Lab 11/07/19 1902 11/07/19 1902 11/08/19 0445 11/08/19 0445 11/09/19 0300 11/10/19 0057 11/11/19 0154  WBC 16.7*   < > 13.7*  --  15.4* 17.5*  --   NEUTROABS 6.0  --  5.0  --   --  6.3  --   HGB 8.8*   < > 8.6*   < > 8.1* 8.6* 8.2*  HCT 28.3*   < > 27.5*   < > 25.7* 27.7* 26.4*  MCV 86.8  --  84.4  --  83.4 83.7  --   PLT 142*   < > 132*  --  131* 158  --    < > = values in this interval not displayed.   Cardiac Enzymes: No results for input(s): CKTOTAL, CKMB, CKMBINDEX, TROPONINI in the last 168 hours. CBG: Recent Labs  Lab 11/11/19 0402 11/11/19 1112 11/11/19 1606 11/11/19 2220 11/12/19 0621  GLUCAP 124* 138* 133* 139* 115*    Iron Studies:  No results for input(s): IRON, TIBC, TRANSFERRIN, FERRITIN in the last 72 hours. Studies/Results: No results found. Medications: Infusions:  sodium chloride     [START ON 11/13/2019]  ceFAZolin (ANCEF) IV     ferric gluconate (FERRLECIT/NULECIT) IV 125 mg (11/11/19 0831)    Scheduled Medications:  Chlorhexidine Gluconate Cloth  6 each Topical Q0600   [START ON 11/15/2019] darbepoetin (ARANESP) injection - DIALYSIS  100 mcg Intravenous Q Thu-HD   doxercalciferol  2 mcg Intravenous Q M,W,F-HD   feeding supplement (NEPRO CARB STEADY)  237 mL Oral BID BM   insulin aspart  0-6 Units Subcutaneous TID AC & HS   insulin NPH Human  18 Units Subcutaneous QAC breakfast   pravastatin  40 mg Oral QHS   sevelamer carbonate  800 mg Oral TID WC   sodium chloride flush  3 mL Intravenous Q12H    have reviewed scheduled and prn medications.  Physical Exam: General: NAD, lying in bed Heart:  RRR Lungs: mostly clear Abdomen: distended- non tender Extremities: 2+ pitting edema Dialysis Access: right sided TDC-  Also with right AVF s/p first stage- due for second stage - good thrill and bruit    11/12/2019,10:19 AM  LOS: 5 days

## 2019-11-12 NOTE — Progress Notes (Signed)
PROGRESS NOTE  [name]:[id number] DOB: 05/25/1940   PCP: Leanna Battles, MD  Patient is from: Home  DOA: 11/07/2019 LOS: 5  Chief complaints: Leg swelling  Brief Narrative / Interim history: 79 year old M with history of CLL, CKD-5, DM-2, thrombocytopenia, anemia and HTN directed to ED from vascular surgery office with lower extremity edema and progressive dyspnea on exertion.  He is followed by Dr. Carolin Sicks at [facility].  He was at vascular surgeons office for assessment of his fistula.  In ED, hemodynamically stable.  99% on RA. Na 32. Cr 8.35. BUN 156. Bicarb 17. Ag 17.  WBC 16.7 with lymphocytosis (16.7 on 10/10).  Hgb 8.8 (9.4 on 10/10).  Platelet 142 (slightly lower than baseline).  Influenza and COVID-19 PCR negative.  CXR cardiomegaly with mild CHF.  Nephrology and IR consulted, and admitted for fluid overload and AKI on CKD-5 with azotemia.  Patient had tunneled HD cath on 11/08/2019 and started HD.  Still fluid overloaded.  Soft blood pressures. Plan for right basilic vein transposition by vascular surgery on 11/2.    Subjective: Seen and examined earlier this morning while on HD.  No major events overnight of this morning.  No complaints.  Objective: Vitals:   11/12/19 1140 11/12/19 1210 11/12/19 1218 11/12/19 1252  BP: (!) 121/51 (!) 102/36 (!) 122/46 (!) 121/47  Pulse:    79  Resp: (!) 28 (!) 29 (!) 32 19  Temp:   98.2 F (36.8 C) 98.7 F (37.1 C)  TempSrc:   Oral Oral  SpO2:   94% 99%  Weight:      Height:        Intake/Output Summary (Last 24 hours) at 11/12/2019 1401 Last data filed at 11/12/2019 1218 Gross per 24 hour  Intake 603 ml  Output 2000 ml  Net -1397 ml   Filed Weights   11/10/19 1345 11/12/19 0837 11/12/19 0857  Weight: 100.5 kg 100.9 kg 100.9 kg    Examination: GENERAL: No apparent distress.  Nontoxic. HEENT: MMM.  Vision and hearing grossly intact.  NECK: Supple.  No apparent JVD.  RESP:  No IWOB.  Fair aeration  bilaterally. CVS:  RRR. Heart sounds normal.  ABD/GI/GU: BS+. Abd soft, NTND.  MSK/EXT:  Moves extremities. No apparent deformity.  Trace edema bilaterally. SKIN: no apparent skin lesion or wound NEURO: Awake, alert and oriented appropriately.  No apparent focal neuro deficit. PSYCH: Calm. Normal affect.  Procedures:  None  Microbiology summarized: COVID-19 PCR negative. Influenza PCR negative.  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  Not uremic. -Tunneled HD catheter on 11/08/2019. First HD on 11/09/2019 -HD per nephrology -Outpatient HD at [facility]. -Right basilic vein transposition by vascular surgery on 11/2 -Possibly home on 11/3.  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  Fluid management with HD.  Ultrafiltration of 2 L today. -Continue HD per nephrology -GDMT-continue holding Coreg and Imdur with soft blood pressures  Anion gap metabolic acidosis: Likely due to azotemia.  Improved. Hyperphosphatemia-likely due to renal failure.  Resolved. -Per nephrology.  CLL/leukocytosis/ymphocytosis: Relatively stable. -Outpatient follow-up with hematology.  Controlled DM-2: A1c 6.2%. Recent Labs  Lab 11/11/19 0402 11/11/19 1112 11/11/19 1606 11/11/19 2220 11/12/19 0621  GLUCAP 124* 138* 133* 139* 115*  -Continue Novolin and 25 units before every meal -Continue SSI-renal -Monitor CBG.  Microcytic anemia/folic acid deficiency/anemia of renal disease: B/l Hgb~10>  8.8 (admit)> 8.6> 7.8.  Denies melena or hematochezia. -Monitor H&H -IV iron and Aranesp per nephrology. -Continue folic acid supplementation  Essential hypertension: Soft DBP -Continue holding home BP meds  Chronic mild thrombocytopenia: Resolved. -Monitor intermittently  Goal of care: Significant cardiorenal comorbidities and history of CLL. Still full code.   Prefers to remain full code.  Nutrition Body mass index is 25.06 kg/m. Nutrition Problem: Increased nutrient needs Etiology: chronic illness (CKDV on HD) Signs/Symptoms: estimated needs Interventions: Nepro shake   DVT prophylaxis:  SCDs Start: 11/08/19 0022  Code Status: Full code Family Communication: Patient and/or RN. Available if any question.  Status is: Inpatient  Remains inpatient appropriate because:Unsafe d/c plan and Inpatient level of care appropriate due to severity of illness   Dispo: The patient is from: Home              Anticipated d/c is to: Home              Anticipated d/c date is: 3 days once cleared by nephrology and vascular surgery              Patient currently is not medically stable to d/c.       Consultants:  Nephrology IR Vascular surgery   Sch Meds:  Scheduled Meds: . Chlorhexidine Gluconate Cloth  6 each Topical Q0600  . [START ON 11/15/2019] darbepoetin (ARANESP) injection - DIALYSIS  100 mcg Intravenous Q Thu-HD  . doxercalciferol  2 mcg Intravenous Q M,W,F-HD  . feeding supplement (NEPRO CARB STEADY)  237 mL Oral BID BM  . insulin aspart  0-6 Units Subcutaneous TID AC & HS  . insulin NPH Human  18 Units Subcutaneous QAC breakfast  . pravastatin  40 mg Oral QHS  . sevelamer carbonate  800 mg Oral TID WC  . sodium chloride flush  3 mL Intravenous Q12H   Continuous Infusions: . sodium chloride    . [START ON 11/13/2019]  ceFAZolin (ANCEF) IV    . ferric gluconate (FERRLECIT/NULECIT) IV 125 mg (11/12/19 1258)   PRN Meds:.sodium chloride, acetaminophen **OR** acetaminophen, HYDROcodone-acetaminophen, ondansetron **OR** ondansetron (ZOFRAN) IV, sodium chloride flush  Antimicrobials: Anti-infectives (From admission, onward)   Start     Dose/Rate Route Frequency Ordered Stop   11/13/19 0000  ceFAZolin (ANCEF) IVPB 2g/100 mL premix       Note to Pharmacy: Send with pt to OR   2 g 200 mL/hr over 30 Minutes Intravenous On call 11/12/19  0633 11/14/19 0000   11/08/19 1717  ceFAZolin (ANCEF) 2-4 GM/100ML-% IVPB       Note to Pharmacy: Desiree Hane   : cabinet override      11/08/19 1717 11/09/19 0529   11/08/19 0945  ceFAZolin (ANCEF) IVPB 2g/100 mL premix        2 g 200 mL/hr over 30 Minutes Intravenous To Radiology 11/08/19 0855 11/08/19 1754       I have personally reviewed the following labs and images: CBC: Recent Labs  Lab 11/07/19 1902 11/07/19 1902 11/08/19 0445 11/09/19 0300 11/10/19 0057 11/11/19 0154 11/12/19 1015  WBC 16.7*  --  13.7* 15.4* 17.5*  --  18.0*  NEUTROABS 6.0  --  5.0  --  6.3  --   --   HGB 8.8*   < > 8.6* 8.1* 8.6* 8.2* 7.8*  HCT 28.3*   < > 27.5* 25.7* 27.7* 26.4* 25.6*  MCV 86.8  --  84.4 83.4 83.7  --  85.3  PLT 142*  --  132* 131* 158  --  161   < > = values in this interval not displayed.   BMP &GFR Recent Labs  Lab 11/08/19 0445 11/09/19 0300 11/10/19 0057 11/11/19 0154 11/12/19 1015  NA 134* 134* 136 135 132*  K 4.3 3.9 3.9 3.8 3.7  CL 99 99 99 97* 95*  CO2 18* 17* 21* 25 20*  GLUCOSE 129* 136* 147* 147* 188*  BUN 158* 168* 125* 73* 86*  CREATININE 8.26* 8.42* 7.24* 5.55* 6.25*  CALCIUM 7.6* 7.4* 7.8* 7.7* 6.8*  MG 2.4 2.4 2.4 2.1  --   PHOS 6.2* 6.5* 5.0* 3.6 4.4   Estimated Creatinine Clearance: 13.1 mL/min (A) (by C-G formula based on SCr of 6.25 mg/dL (H)). Liver & Pancreas: Recent Labs  Lab 11/07/19 1902 11/07/19 1902 11/08/19 0445 11/09/19 0300 11/10/19 0057 11/11/19 0154 11/12/19 1015  AST 27  --  24  --   --   --   --   ALT 36  --  35 33  --   --   --   ALKPHOS 64  --  59  --   --   --   --   BILITOT 1.3*  --  1.0  --   --   --   --   PROT 5.9*  --  5.7*  --   --   --   --   ALBUMIN 3.0*   < > 3.0* 2.8* 2.9* 2.7* 2.4*   < > = values in this interval not displayed.   No results for input(s): LIPASE, AMYLASE in the last 168 hours. No results for input(s): AMMONIA in the last 168 hours. Diabetic: No results for input(s): HGBA1C in the  last 72 hours. Recent Labs  Lab 11/11/19 0402 11/11/19 1112 11/11/19 1606 11/11/19 2220 11/12/19 0621  GLUCAP 124* 138* 133* 139* 115*   Cardiac Enzymes: No results for input(s): CKTOTAL, CKMB, CKMBINDEX, TROPONINI in the last 168 hours. No results for input(s): PROBNP in the last 8760 hours. Coagulation Profile: Recent Labs  Lab 11/08/19 0759  INR 1.4*   Thyroid Function Tests: No results for input(s): TSH, T4TOTAL, FREET4, T3FREE, THYROIDAB in the last 72 hours. Lipid Profile: No results for input(s): CHOL, HDL, LDLCALC, TRIG, CHOLHDL, LDLDIRECT in the last 72 hours. Anemia Panel: No results for input(s): VITAMINB12, FOLATE, FERRITIN, TIBC, IRON, RETICCTPCT in the last 72 hours. Urine analysis:    Component Value Date/Time   COLORURINE YELLOW 07/11/2018 0802   APPEARANCEUR CLEAR 07/11/2018 0802   LABSPEC 1.009 07/11/2018 0802   PHURINE 6.0 07/11/2018 0802   GLUCOSEU NEGATIVE 07/11/2018 0802   HGBUR NEGATIVE 07/11/2018 0802   BILIRUBINUR NEGATIVE 07/11/2018 0802   KETONESUR NEGATIVE 07/11/2018 0802   PROTEINUR 100 (A) 07/11/2018 0802   NITRITE NEGATIVE 07/11/2018 0802   LEUKOCYTESUR NEGATIVE 07/11/2018 0802   Sepsis Labs: Invalid input(s): PROCALCITONIN, [place]  Microbiology: Recent Results (from the past 240 hour(s))  Respiratory Panel by RT PCR (Flu A&B, Covid) - Nasopharyngeal Swab     Status: None   Collection Time: 11/07/19  9:06 PM   Specimen: Nasopharyngeal Swab  Result Value Ref Range Status   SARS Coronavirus 2 by RT PCR NEGATIVE NEGATIVE Final    Comment: (NOTE) SARS-CoV-2 target nucleic acids are NOT DETECTED.  The SARS-CoV-2 RNA is generally detectable in upper respiratoy specimens during the acute phase of infection. The lowest concentration of SARS-CoV-2 viral copies this assay can detect is 131 copies/mL. A negative result does not preclude SARS-Cov-2  infection and should not be used as the sole basis for treatment or other patient  management decisions. A negative result may occur with  improper specimen collection/handling, submission of specimen other than nasopharyngeal swab, presence of viral mutation(s) within the areas targeted by this assay, and inadequate number of viral copies (<131 copies/mL). A negative result must be combined with clinical observations, patient history, and epidemiological information. The expected result is Negative.  Fact Sheet for Patients:  PinkCheek.be  Fact Sheet for Healthcare Providers:  GravelBags.it  This test is no t yet approved or cleared by the [place] FDA and  has been authorized for detection and/or diagnosis of SARS-CoV-2 by FDA under an Emergency Use Authorization (EUA). This EUA will remain  in effect (meaning this test can be used) for the duration of the COVID-19 declaration under Section 564(b)(1) of the Act, 21 U.S.C. section 360bbb-3(b)(1), unless the authorization is terminated or revoked sooner.     Influenza A by PCR NEGATIVE NEGATIVE Final   Influenza B by PCR NEGATIVE NEGATIVE Final    Comment: (NOTE) The Xpert Xpress SARS-CoV-2/FLU/RSV assay is intended as an aid in  the diagnosis of influenza from Nasopharyngeal swab specimens and  should not be used as a sole basis for treatment. Nasal washings and  aspirates are unacceptable for Xpert Xpress SARS-CoV-2/FLU/RSV  testing.  Fact Sheet for Patients: PinkCheek.be  Fact Sheet for Healthcare Providers: GravelBags.it  This test is not yet approved or cleared by the [place] FDA and  has been authorized for detection and/or diagnosis of SARS-CoV-2 by  FDA under an Emergency Use Authorization (EUA). This EUA will remain  in effect (meaning this test can be used) for the duration of the  Covid-19 declaration under Section 564(b)(1) of the Act, 21  U.S.C. section 360bbb-3(b)(1),  unless the authorization is  terminated or revoked. Performed at [facility], [place] [address] [place], [place] [address]     Radiology Studies: No results found.   Blimi Godby T. [place]  If 7PM-7AM, please contact night-coverage [email or web address] 11/12/2019, 2:01 PM

## 2019-11-12 NOTE — Progress Notes (Signed)
Round on patient in the HD department while receiving treatment. This is patient's third treatment. He is tolerating well. No complaints of discomfort. Patient does report that he was finally able to tolerate eating  both last night and this morning. Discussed with patient the effects of the treatment cleaning his blood helping him to tolerate food better. Plan to follow patient through D/C to home.   Dorthey Sawyer, RN  Dialysis Nurse Coordinator

## 2019-11-13 ENCOUNTER — Inpatient Hospital Stay (HOSPITAL_COMMUNITY): Admission: RE | Admit: 2019-11-13 | Payer: Medicare Other | Source: Home / Self Care | Admitting: Vascular Surgery

## 2019-11-13 ENCOUNTER — Inpatient Hospital Stay (HOSPITAL_COMMUNITY): Payer: Medicare Other | Admitting: Certified Registered Nurse Anesthetist

## 2019-11-13 ENCOUNTER — Encounter (HOSPITAL_COMMUNITY)
Admission: EM | Disposition: A | Discharge status: Home / Self Care | Payer: Self-pay | Source: Home / Self Care | Attending: Student

## 2019-11-13 ENCOUNTER — Encounter (HOSPITAL_COMMUNITY): Payer: Self-pay | Admitting: Internal Medicine

## 2019-11-13 DIAGNOSIS — N185 Chronic kidney disease, stage 5: Secondary | ICD-10-CM

## 2019-11-13 HISTORY — PX: BASCILIC VEIN TRANSPOSITION: SHX5742

## 2019-11-13 LAB — POCT I-STAT, CHEM 8
BUN: 53 mg/dL — ABNORMAL HIGH (ref 8–23)
Calcium, Ion: 0.87 mmol/L — CL (ref 1.15–1.40)
Chloride: 96 mmol/L — ABNORMAL LOW (ref 98–111)
Creatinine, Ser: 6.8 mg/dL — ABNORMAL HIGH (ref 0.61–1.24)
Glucose, Bld: 166 mg/dL — ABNORMAL HIGH (ref 70–99)
HCT: 28 % — ABNORMAL LOW (ref 39.0–52.0)
Hemoglobin: 9.5 g/dL — ABNORMAL LOW (ref 13.0–17.0)
Potassium: 4.3 mmol/L (ref 3.5–5.1)
Sodium: 133 mmol/L — ABNORMAL LOW (ref 135–145)
TCO2: 24 mmol/L (ref 22–32)

## 2019-11-13 LAB — GLUCOSE, CAPILLARY
Glucose-Capillary: 126 mg/dL — ABNORMAL HIGH (ref 70–99)
Glucose-Capillary: 51 mg/dL — ABNORMAL LOW (ref 70–99)
Glucose-Capillary: 206 mg/dL — ABNORMAL HIGH (ref 70–99)
Glucose-Capillary: 165 mg/dL — ABNORMAL HIGH (ref 70–99)
Glucose-Capillary: 154 mg/dL — ABNORMAL HIGH (ref 70–99)
Glucose-Capillary: 170 mg/dL — ABNORMAL HIGH (ref 70–99)
Glucose-Capillary: 268 mg/dL — ABNORMAL HIGH (ref 70–99)

## 2019-11-13 SURGERY — TRANSPOSITION, VEIN, BASILIC
Anesthesia: General | Laterality: Right

## 2019-11-13 MED ORDER — ONDANSETRON HCL 4 MG/2ML IJ SOLN: 4.0000 mg | Freq: Once | INTRAMUSCULAR | Status: DC | PRN

## 2019-11-13 MED ORDER — OXYCODONE HCL 5 MG/5ML PO SOLN: 5.0000 mg | Freq: Once | ORAL | Status: DC | PRN

## 2019-11-13 MED ORDER — OXYCODONE HCL 5 MG PO TABS: 5.0000 mg | ORAL_TABLET | Freq: Once | ORAL | Status: DC | PRN

## 2019-11-13 MED ORDER — 0.9 % SODIUM CHLORIDE (POUR BTL) OPTIME
TOPICAL | Status: DC | PRN
  Administered 2019-11-13: 1000 mL

## 2019-11-13 MED ORDER — CHLORHEXIDINE GLUCONATE 0.12 % MT SOLN: 15.0000 mL | Freq: Once | OROMUCOSAL | Status: DC

## 2019-11-13 MED ORDER — SODIUM CHLORIDE 0.9 % IV SOLN
INTRAVENOUS | Status: DC | PRN
  Administered 2019-11-13: 500 mL

## 2019-11-13 MED ORDER — ONDANSETRON HCL 4 MG/2ML IJ SOLN
INTRAMUSCULAR | Status: DC | PRN
  Administered 2019-11-13: 4 mg via INTRAVENOUS

## 2019-11-13 MED ORDER — PHENYLEPHRINE 40 MCG/ML (10ML) SYRINGE FOR IV PUSH (FOR BLOOD PRESSURE SUPPORT)
PREFILLED_SYRINGE | INTRAVENOUS | Status: DC | PRN
  Administered 2019-11-13: 80 ug via INTRAVENOUS

## 2019-11-13 MED ORDER — FENTANYL CITRATE (PF) 100 MCG/2ML IJ SOLN: 25.0000 ug | INTRAMUSCULAR | Status: DC | PRN

## 2019-11-13 MED ORDER — CEFAZOLIN SODIUM-DEXTROSE 2-3 GM-%(50ML) IV SOLR
INTRAVENOUS | Status: DC | PRN
  Administered 2019-11-13: 2 g via INTRAVENOUS

## 2019-11-13 MED ORDER — PHENYLEPHRINE HCL-NACL 10-0.9 MG/250ML-% IV SOLN: INTRAVENOUS | Status: DC | PRN

## 2019-11-13 MED ORDER — DEXTROSE 50 % IV SOLN
INTRAVENOUS | Status: AC
  Filled 2019-11-13: qty 50

## 2019-11-13 MED ORDER — PHENYLEPHRINE HCL-NACL 10-0.9 MG/250ML-% IV SOLN
INTRAVENOUS | Status: DC | PRN
  Administered 2019-11-13: 25 ug/min via INTRAVENOUS

## 2019-11-13 MED ORDER — PROPOFOL 10 MG/ML IV BOLUS
INTRAVENOUS | Status: DC | PRN
  Administered 2019-11-13: 100 mg via INTRAVENOUS

## 2019-11-13 MED ORDER — DEXTROSE 50 % IV SOLN
1.0000 | Freq: Once | INTRAVENOUS | Status: AC
  Administered 2019-11-13: 50 mL via INTRAVENOUS

## 2019-11-13 MED ORDER — VASOPRESSIN 20 UNIT/ML IV SOLN
INTRAVENOUS | Status: DC | PRN
  Administered 2019-11-13: 2 [IU] via INTRAVENOUS
  Administered 2019-11-13 (×2): 1 [IU] via INTRAVENOUS
  Administered 2019-11-13 (×2): 2 [IU] via INTRAVENOUS

## 2019-11-13 MED ORDER — INSULIN NPH (HUMAN) (ISOPHANE) 100 UNIT/ML ~~LOC~~ SUSP
10.0000 [IU] | Freq: Every day | SUBCUTANEOUS | Status: DC
  Administered 2019-11-14: 10 [IU] via SUBCUTANEOUS
  Filled 2019-11-13: qty 10

## 2019-11-13 MED ORDER — EPHEDRINE SULFATE-NACL 50-0.9 MG/10ML-% IV SOSY
PREFILLED_SYRINGE | INTRAVENOUS | Status: DC | PRN
  Administered 2019-11-13: 10 mg via INTRAVENOUS

## 2019-11-13 MED ORDER — DEXAMETHASONE SODIUM PHOSPHATE 10 MG/ML IJ SOLN
INTRAMUSCULAR | Status: DC | PRN
  Administered 2019-11-13: 5 mg via INTRAVENOUS

## 2019-11-13 MED ORDER — LIDOCAINE 2% (20 MG/ML) 5 ML SYRINGE
INTRAMUSCULAR | Status: DC | PRN
  Administered 2019-11-13: 60 mg via INTRAVENOUS

## 2019-11-13 MED ORDER — FENTANYL CITRATE (PF) 250 MCG/5ML IJ SOLN
INTRAMUSCULAR | Status: DC | PRN
  Administered 2019-11-13: 25 ug via INTRAVENOUS
  Administered 2019-11-13: 50 ug via INTRAVENOUS
  Administered 2019-11-13: 25 ug via INTRAVENOUS
  Administered 2019-11-13 (×2): 50 ug via INTRAVENOUS

## 2019-11-13 MED ORDER — ORAL CARE MOUTH RINSE: 15.0000 mL | Freq: Once | OROMUCOSAL | Status: DC

## 2019-11-13 MED ORDER — SODIUM CHLORIDE 0.9 % IV SOLN: INTRAVENOUS | Status: DC

## 2019-11-13 SURGICAL SUPPLY — 30 items
ARMBAND PINK RESTRICT EXTREMIT (MISCELLANEOUS) ×2 IMPLANT
CANISTER SUCT 3000ML PPV (MISCELLANEOUS) ×2 IMPLANT
CANNULA VESSEL 3MM 2 BLNT TIP (CANNULA) ×2 IMPLANT
CLIP LIGATING EXTRA MED SLVR (CLIP) ×2 IMPLANT
CLIP LIGATING EXTRA SM BLUE (MISCELLANEOUS) ×2 IMPLANT
COVER PROBE W GEL 5X96 (DRAPES) ×2 IMPLANT
COVER WAND RF STERILE (DRAPES) ×2 IMPLANT
DECANTER SPIKE VIAL GLASS SM (MISCELLANEOUS) ×2 IMPLANT
DERMABOND ADVANCED (GAUZE/BANDAGES/DRESSINGS) ×1
DERMABOND ADVANCED .7 DNX12 (GAUZE/BANDAGES/DRESSINGS) ×1 IMPLANT
ELECT REM PT RETURN 9FT ADLT (ELECTROSURGICAL) ×2
ELECTRODE REM PT RTRN 9FT ADLT (ELECTROSURGICAL) ×1 IMPLANT
GLOVE SS BIOGEL STRL SZ 7.5 (GLOVE) ×1 IMPLANT
GLOVE SUPERSENSE BIOGEL SZ 7.5 (GLOVE) ×1
GOWN STRL REUS W/ TWL LRG LVL3 (GOWN DISPOSABLE) ×3 IMPLANT
GOWN STRL REUS W/TWL LRG LVL3 (GOWN DISPOSABLE) ×6
KIT BASIN OR (CUSTOM PROCEDURE TRAY) ×2 IMPLANT
KIT TURNOVER KIT B (KITS) ×2 IMPLANT
NS IRRIG 1000ML POUR BTL (IV SOLUTION) ×2 IMPLANT
PACK CV ACCESS (CUSTOM PROCEDURE TRAY) ×2 IMPLANT
PAD ARMBOARD 7.5X6 YLW CONV (MISCELLANEOUS) ×4 IMPLANT
SUT PROLENE 6 0 CC (SUTURE) ×2 IMPLANT
SUT SILK 2 0 SH (SUTURE) IMPLANT
SUT SILK 3 0 (SUTURE) ×2
SUT SILK 3-0 18XBRD TIE 12 (SUTURE) ×1 IMPLANT
SUT VIC AB 3-0 SH 27 (SUTURE) ×4
SUT VIC AB 3-0 SH 27X BRD (SUTURE) ×2 IMPLANT
TOWEL GREEN STERILE (TOWEL DISPOSABLE) ×2 IMPLANT
UNDERPAD 30X36 HEAVY ABSORB (UNDERPADS AND DIAPERS) ×2 IMPLANT
WATER STERILE IRR 1000ML POUR (IV SOLUTION) ×2 IMPLANT

## 2019-11-13 NOTE — Anesthesia Preprocedure Evaluation (Addendum)
Anesthesia Evaluation  Patient identified by MRN, date of birth, ID band Patient awake    Reviewed: Allergy & Precautions, NPO status , Patient's Chart, lab work & pertinent test results  Airway Mallampati: II  TM Distance: >3 FB Neck ROM: Full    Dental  (+) Edentulous Upper, Edentulous Lower   Pulmonary former smoker,   Few crackles at bases     rales    Cardiovascular hypertension,  Rhythm:Regular Rate:Normal     Neuro/Psych    GI/Hepatic   Endo/Other  diabetes  Renal/GU      Musculoskeletal   Abdominal   Peds  Hematology   Anesthesia Other Findings   Reproductive/Obstetrics                             Anesthesia Physical Anesthesia Plan  ASA: III  Anesthesia Plan: General   Post-op Pain Management:    Induction:   PONV Risk Score and Plan: Ondansetron and Dexamethasone  Airway Management Planned: LMA  Additional Equipment:   Intra-op Plan:   Post-operative Plan: Extubation in OR  Informed Consent: I have reviewed the patients History and Physical, chart, labs and discussed the procedure including the risks, benefits and alternatives for the proposed anesthesia with the patient or authorized representative who has indicated his/her understanding and acceptance.       Plan Discussed with: Anesthesiologist and CRNA  Anesthesia Plan Comments:         Anesthesia Quick Evaluation

## 2019-11-13 NOTE — Progress Notes (Signed)
PROGRESS NOTE  Thomas Mcguire ERX:[id number] DOB: [birth year]   PCP: Leanna Battles, MD  Patient is from: Home  DOA: 11/07/2019 LOS: 6  Chief complaints: Leg swelling  Brief Narrative / Interim history: 79 year old M with history of CLL, CKD-5, DM-2, thrombocytopenia, anemia and HTN directed to ED from vascular surgery office with lower extremity edema and progressive dyspnea on exertion.  He is followed by Dr. Carolin Sicks at [facility].  He was at vascular surgeons office for assessment of his fistula.  In ED, hemodynamically stable.  99% on RA. Na 32. Cr 8.35. BUN 156. Bicarb 17. Ag 17.  WBC 16.7 with lymphocytosis (16.7 on 10/10).  Hgb 8.8 (9.4 on 10/10).  Platelet 142 (slightly lower than baseline).  Influenza and COVID-19 PCR negative.  CXR cardiomegaly with mild CHF.  Nephrology and IR consulted, and admitted for fluid overload and AKI on CKD-5 with azotemia.  Patient had tunneled HD cath on 11/08/2019 and started HD.  Still fluid overloaded.  Soft blood pressures. Plan for right basilic vein transposition by vascular surgery today. May be discharged home on 11/3 after hemodialysis.  He will continue outpatient dialysis in [place] [facility].   Subjective: Seen and examined earlier this morning.  No major events overnight of this morning.  No complaints.  No shortness of breath at rest.  Denies chest pain or GI symptoms.  Objective: Vitals:   11/12/19 1218 11/12/19 1252 11/12/19 1834 11/13/19 0542  BP: (!) 122/46 (!) 121/47 (!) 107/49 (!) 114/49  Pulse:  79 70 69  Resp: (!) 32 19 17 18   Temp: 98.2 F (36.8 C) 98.7 F (37.1 C) 99 F (37.2 C) 98.9 F (37.2 C)  TempSrc: Oral Oral Oral Oral  SpO2: 94% 99% 95% 97%  Weight:      Height:        Intake/Output Summary (Last 24 hours) at 11/13/2019 1141 Last data filed at 11/13/2019 9509 Gross per 24 hour  Intake 593 ml  Output 2000 ml  Net -1407 ml   Filed Weights   11/10/19 1345 11/12/19 0837 11/12/19 0857  Weight: 100.5 kg 100.9  kg 100.9 kg    Examination:  GENERAL: No apparent distress.  Nontoxic. HEENT: MMM.  Vision and hearing grossly intact.  NECK: Supple.  No apparent JVD.  RESP:  No IWOB.  Fair aeration bilaterally. CVS:  RRR. Heart sounds normal.  Right aVF with good bruits. ABD/GI/GU: BS+. Abd soft, NTND.  MSK/EXT:  Moves extremities. No apparent deformity.  Trace edema bilaterally. SKIN: no apparent skin lesion or wound NEURO: Awake, alert and oriented appropriately.  No apparent focal neuro deficit. PSYCH: Calm. Normal affect.  Procedures:  None  Microbiology summarized: COVID-19 PCR negative. Influenza PCR negative.  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  Not uremic. -Tunneled HD catheter on 11/08/2019. First HD on 11/09/2019 -HD per nephrology -Outpatient HD at [place]. -Right basilic vein transposition by vascular surgery today. -Possibly home on 11/3.  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  Fluid management with HD.   -Continue HD per nephrology -GDMT-continue holding Coreg and Imdur with soft BP.  Anion gap metabolic acidosis: Likely due to azotemia.  Improved. Hyperphosphatemia-likely due to renal failure.  Resolved. -Per nephrology.  CLL/leukocytosis/ymphocytosis: Relatively stable. -Outpatient follow-up with hematology.  Controlled DM-2 with hyperglycemia: A1c 6.2%.  Hyperglycemia likely due to NPO for surgery Recent Labs  Lab  11/12/19 1250 11/12/19 1625 11/12/19 2113 11/13/19 0612 11/13/19 1106  GLUCAP 112* 183* 148* 126* 51*  -Continue SSI-renal -Reduce Novolin N to 10 units in the morning.  He is on 25 units at home. -Monitor CBG.  Microcytic anemia/folic acid deficiency/anemia of renal disease: B/l Hgb~10>  8.8 (admit)> 8.6> 7.8.  Denies melena or hematochezia. -Monitor H&H -IV iron and Aranesp per  nephrology. -Continue folic acid supplementation  Essential hypertension: soft BP with low DBP -Continue holding home BP meds  Chronic mild thrombocytopenia: Resolved. -Monitor intermittently  Goal of care: significant cardiorenal comorbidities and history of CLL. Still full code.  Prefers to remain full code.  Nutrition Body mass index is 25.06 kg/m. Nutrition Problem: Increased nutrient needs Etiology: chronic illness (CKDV on HD) Signs/Symptoms: estimated needs Interventions: Nepro shake   DVT prophylaxis:  SCDs Start: 11/08/19 0022  Code Status: Full code Family Communication: Updated patient's wife over the phone. Status is: Inpatient  Remains inpatient appropriate because:Unsafe d/c plan and Inpatient level of care appropriate due to severity of illness   Dispo: The patient is from: Home              Anticipated d/c is to: Home              Anticipated d/c date is: 1 day once cleared by nephrology and vascular surgery              Patient currently is not medically stable to d/c.       Consultants:  Nephrology IR Vascular surgery   Sch Meds:  Scheduled Meds:  Chlorhexidine Gluconate Cloth  6 each Topical Q0600   [START ON 11/15/2019] darbepoetin (ARANESP) injection - DIALYSIS  100 mcg Intravenous Q Thu-HD   doxercalciferol  2 mcg Intravenous Q M,W,F-HD   feeding supplement (NEPRO CARB STEADY)  237 mL Oral BID BM   insulin aspart  0-6 Units Subcutaneous TID AC & HS   [START ON 11/14/2019] insulin NPH Human  10 Units Subcutaneous QAC breakfast   pravastatin  40 mg Oral QHS   sodium chloride flush  3 mL Intravenous Q12H   Continuous Infusions:  sodium chloride      ceFAZolin (ANCEF) IV     ferric gluconate (FERRLECIT/NULECIT) IV 125 mg (11/13/19 1053)   PRN Meds:.sodium chloride, acetaminophen **OR** acetaminophen, HYDROcodone-acetaminophen, ondansetron **OR** ondansetron (ZOFRAN) IV, sodium chloride flush  Antimicrobials: Anti-infectives  (From admission, onward)   Start     Dose/Rate Route Frequency Ordered Stop   11/13/19 0000  ceFAZolin (ANCEF) IVPB 2g/100 mL premix       Note to Pharmacy: Send with pt to OR   2 g 200 mL/hr over 30 Minutes Intravenous On call 11/12/19 0633 11/14/19 0000   11/08/19 1717  ceFAZolin (ANCEF) 2-4 GM/100ML-% IVPB       Note to Pharmacy: Desiree Hane   : cabinet override      11/08/19 1717 11/09/19 0529   11/08/19 0945  ceFAZolin (ANCEF) IVPB 2g/100 mL premix        2 g 200 mL/hr over 30 Minutes Intravenous To Radiology 11/08/19 0855 11/08/19 1754       I have personally reviewed the following labs and images: CBC: Recent Labs  Lab 11/07/19 1902 11/07/19 1902 11/08/19 0445 11/09/19 0300 11/10/19 0057 11/11/19 0154 11/12/19 1015  WBC 16.7*  --  13.7* 15.4* 17.5*  --  18.0*  NEUTROABS 6.0  --  5.0  --  6.3  --   --   HGB  8.8*   < > 8.6* 8.1* 8.6* 8.2* 7.8*  HCT 28.3*   < > 27.5* 25.7* 27.7* 26.4* 25.6*  MCV 86.8  --  84.4 83.4 83.7  --  85.3  PLT 142*  --  132* 131* 158  --  161   < > = values in this interval not displayed.   BMP &GFR Recent Labs  Lab 11/08/19 0445 11/09/19 0300 11/10/19 0057 11/11/19 0154 11/12/19 1015  NA 134* 134* 136 135 132*  K 4.3 3.9 3.9 3.8 3.7  CL 99 99 99 97* 95*  CO2 18* 17* 21* 25 20*  GLUCOSE 129* 136* 147* 147* 188*  BUN 158* 168* 125* 73* 86*  CREATININE 8.26* 8.42* 7.24* 5.55* 6.25*  CALCIUM 7.6* 7.4* 7.8* 7.7* 6.8*  MG 2.4 2.4 2.4 2.1  --   PHOS 6.2* 6.5* 5.0* 3.6 4.4   Estimated Creatinine Clearance: 13.1 mL/min (A) (by C-G formula based on SCr of 6.25 mg/dL (H)). Liver & Pancreas: Recent Labs  Lab 11/07/19 1902 11/07/19 1902 11/08/19 0445 11/09/19 0300 11/10/19 0057 11/11/19 0154 11/12/19 1015  AST 27  --  24  --   --   --   --   ALT 36  --  35 33  --   --   --   ALKPHOS 64  --  59  --   --   --   --   BILITOT 1.3*  --  1.0  --   --   --   --   PROT 5.9*  --  5.7*  --   --   --   --   ALBUMIN 3.0*   < > 3.0* 2.8*  2.9* 2.7* 2.4*   < > = values in this interval not displayed.   No results for input(s): LIPASE, AMYLASE in the last 168 hours. No results for input(s): AMMONIA in the last 168 hours. Diabetic: No results for input(s): HGBA1C in the last 72 hours. Recent Labs  Lab 11/12/19 1250 11/12/19 1625 11/12/19 2113 11/13/19 0612 11/13/19 1106  GLUCAP 112* 183* 148* 126* 51*   Cardiac Enzymes: No results for input(s): CKTOTAL, CKMB, CKMBINDEX, TROPONINI in the last 168 hours. No results for input(s): PROBNP in the last 8760 hours. Coagulation Profile: Recent Labs  Lab 11/08/19 0759  INR 1.4*   Thyroid Function Tests: No results for input(s): TSH, T4TOTAL, FREET4, T3FREE, THYROIDAB in the last 72 hours. Lipid Profile: No results for input(s): CHOL, HDL, LDLCALC, TRIG, CHOLHDL, LDLDIRECT in the last 72 hours. Anemia Panel: No results for input(s): VITAMINB12, FOLATE, FERRITIN, TIBC, IRON, RETICCTPCT in the last 72 hours. Urine analysis:    Component Value Date/Time   COLORURINE YELLOW 07/11/2018 0802   APPEARANCEUR CLEAR 07/11/2018 0802   LABSPEC 1.009 07/11/2018 0802   PHURINE 6.0 07/11/2018 0802   GLUCOSEU NEGATIVE 07/11/2018 0802   HGBUR NEGATIVE 07/11/2018 0802   BILIRUBINUR NEGATIVE 07/11/2018 0802   KETONESUR NEGATIVE 07/11/2018 0802   PROTEINUR 100 (A) 07/11/2018 0802   NITRITE NEGATIVE 07/11/2018 0802   LEUKOCYTESUR NEGATIVE 07/11/2018 0802   Sepsis Labs: Invalid input(s): PROCALCITONIN, [place]  Microbiology: Recent Results (from the past 240 hour(s))  Respiratory Panel by RT PCR (Flu A&B, Covid) - Nasopharyngeal Swab     Status: None   Collection Time: 11/07/19  9:06 PM   Specimen: Nasopharyngeal Swab  Result Value Ref Range Status   SARS Coronavirus 2 by RT PCR NEGATIVE NEGATIVE Final    Comment: (NOTE) SARS-CoV-2 target nucleic acids  are NOT DETECTED.  The SARS-CoV-2 RNA is generally detectable in upper respiratoy specimens during the acute phase of  infection. The lowest concentration of SARS-CoV-2 viral copies this assay can detect is 131 copies/mL. A negative result does not preclude SARS-Cov-2 infection and should not be used as the sole basis for treatment or other patient management decisions. A negative result may occur with  improper specimen collection/handling, submission of specimen other than nasopharyngeal swab, presence of viral mutation(s) within the areas targeted by this assay, and inadequate number of viral copies (<131 copies/mL). A negative result must be combined with clinical observations, patient history, and epidemiological information. The expected result is Negative.  Fact Sheet for Patients:  PinkCheek.be  Fact Sheet for Healthcare Providers:  GravelBags.it  This test is no t yet approved or cleared by the [place] FDA and  has been authorized for detection and/or diagnosis of SARS-CoV-2 by FDA under an Emergency Use Authorization (EUA). This EUA will remain  in effect (meaning this test can be used) for the duration of the COVID-19 declaration under Section 564(b)(1) of the Act, 21 U.S.C. section 360bbb-3(b)(1), unless the authorization is terminated or revoked sooner.     Influenza A by PCR NEGATIVE NEGATIVE Final   Influenza B by PCR NEGATIVE NEGATIVE Final    Comment: (NOTE) The Xpert Xpress SARS-CoV-2/FLU/RSV assay is intended as an aid in  the diagnosis of influenza from Nasopharyngeal swab specimens and  should not be used as a sole basis for treatment. Nasal washings and  aspirates are unacceptable for Xpert Xpress SARS-CoV-2/FLU/RSV  testing.  Fact Sheet for Patients: PinkCheek.be  Fact Sheet for Healthcare Providers: GravelBags.it  This test is not yet approved or cleared by the [place] FDA and  has been authorized for detection and/or diagnosis of SARS-CoV-2  by  FDA under an Emergency Use Authorization (EUA). This EUA will remain  in effect (meaning this test can be used) for the duration of the  Covid-19 declaration under Section 564(b)(1) of the Act, 21  U.S.C. section 360bbb-3(b)(1), unless the authorization is  terminated or revoked. Performed at [facility], [place] [address], [place], [place] [address]     Radiology Studies: No results found.   Crystie Yanko T. [place]  If 7PM-7AM, please contact night-coverage [email or web address] 11/13/2019, 11:41 AM

## 2019-11-13 NOTE — Progress Notes (Signed)
Subjective: Patient feels fine today with no complaints.  Denies significant shortness of breath.  Planning to undergo surgery today.  Objective Vital signs in last 24 hours: Vitals:   11/12/19 1218 11/12/19 1252 11/12/19 1834 11/13/19 0542  BP: (!) 122/46 (!) 121/47 (!) 107/49 (!) 114/49  Pulse:  79 70 69  Resp: (!) 32 19 17 18   Temp: 98.2 F (36.8 C) 98.7 F (37.1 C) 99 F (37.2 C) 98.9 F (37.2 C)  TempSrc: Oral Oral Oral Oral  SpO2: 94% 99% 95% 97%  Weight:      Height:       Weight change:   Intake/Output Summary (Last 24 hours) at 11/13/2019 1102 Last data filed at 11/13/2019 8182 Gross per 24 hour  Intake 593 ml  Output 2000 ml  Net -1407 ml    Assessment/Plan: 79 year old BM  With DM, HTN and progressive CKD-  Now is time to initiate dialysis  1.Renal- advanced CKD for some time arrived with progressive renal failure, failure to thrive, volume overload.   Now status post 3 sessions of dialysis. Our team has been activated to get OP HD spot and OP HD education started - has spot at [facility].  Vascular on board for his second stage BVT to be done today.  Feels more comfortable leaving tomorrow after dialysis to start outpatient dialysis on Thursday. 2. Hypertension/volume  -volume overload significantly improved with dialysis continue to monitor. 3. Anemia  - giving ESA  iron stores low, repleting as well 4. Bones- on Hectorol.  Corrected calcium is low.  We will stop Renvela given normal phosphorus and change to high calcium bath.    Reesa Chew    Labs: Basic Metabolic Panel: Recent Labs  Lab 11/10/19 0057 11/11/19 0154 11/12/19 1015  NA 136 135 132*  K 3.9 3.8 3.7  CL 99 97* 95*  CO2 21* 25 20*  GLUCOSE 147* 147* 188*  BUN 125* 73* 86*  CREATININE 7.24* 5.55* 6.25*  CALCIUM 7.8* 7.7* 6.8*  PHOS 5.0* 3.6 4.4   Liver Function Tests: Recent Labs  Lab 11/07/19 1902 11/07/19 1902 11/08/19 0445 11/08/19 0445 11/09/19 0300 11/09/19 0300  11/10/19 0057 11/11/19 0154 11/12/19 1015  AST 27  --  24  --   --   --   --   --   --   ALT 36  --  35  --  33  --   --   --   --   ALKPHOS 64  --  59  --   --   --   --   --   --   BILITOT 1.3*  --  1.0  --   --   --   --   --   --   PROT 5.9*  --  5.7*  --   --   --   --   --   --   ALBUMIN 3.0*   < > 3.0*   < > 2.8*   < > 2.9* 2.7* 2.4*   < > = values in this interval not displayed.   No results for input(s): LIPASE, AMYLASE in the last 168 hours. No results for input(s): AMMONIA in the last 168 hours. CBC: Recent Labs  Lab 11/07/19 1902 11/07/19 1902 11/08/19 0445 11/08/19 0445 11/09/19 0300 11/09/19 0300 11/10/19 0057 11/11/19 0154 11/12/19 1015  WBC 16.7*   < > 13.7*   < > 15.4*  --  17.5*  --  18.0*  NEUTROABS 6.0  --  5.0  --   --   --  6.3  --   --   HGB 8.8*   < > 8.6*   < > 8.1*   < > 8.6* 8.2* 7.8*  HCT 28.3*   < > 27.5*   < > 25.7*   < > 27.7* 26.4* 25.6*  MCV 86.8  --  84.4  --  83.4  --  83.7  --  85.3  PLT 142*   < > 132*   < > 131*  --  158  --  161   < > = values in this interval not displayed.   Cardiac Enzymes: No results for input(s): CKTOTAL, CKMB, CKMBINDEX, TROPONINI in the last 168 hours. CBG: Recent Labs  Lab 11/12/19 0621 11/12/19 1250 11/12/19 1625 11/12/19 2113 11/13/19 0612  GLUCAP 115* 112* 183* 148* 126*    Iron Studies:  No results for input(s): IRON, TIBC, TRANSFERRIN, FERRITIN in the last 72 hours. Studies/Results: No results found. Medications: Infusions: . sodium chloride    .  ceFAZolin (ANCEF) IV    . ferric gluconate (FERRLECIT/NULECIT) IV 125 mg (11/13/19 1053)    Scheduled Medications: . Chlorhexidine Gluconate Cloth  6 each Topical Q0600  . [START ON 11/15/2019] darbepoetin (ARANESP) injection - DIALYSIS  100 mcg Intravenous Q Thu-HD  . doxercalciferol  2 mcg Intravenous Q M,W,F-HD  . feeding supplement (NEPRO CARB STEADY)  237 mL Oral BID BM  . insulin aspart  0-6 Units Subcutaneous TID AC & HS  . insulin NPH  Human  18 Units Subcutaneous QAC breakfast  . pravastatin  40 mg Oral QHS  . sodium chloride flush  3 mL Intravenous Q12H    have reviewed scheduled and prn medications.  Physical Exam: General: NAD, lying in bed Heart: RRR Lungs: mostly clear Abdomen: distended- non tender Extremities: 2+ pitting edema Dialysis Access: right sided TDC-  Also with right AVF s/p first stage- due for second stage - good thrill and bruit    11/13/2019,11:02 AM  LOS: 6 days

## 2019-11-13 NOTE — Anesthesia Procedure Notes (Signed)
Procedure Name: LMA Insertion Date/Time: 11/13/2019 3:44 PM Performed by: Rande Brunt, CRNA Pre-anesthesia Checklist: Patient identified, Emergency Drugs available, Suction available and Patient being monitored Patient Re-evaluated:Patient Re-evaluated prior to induction Oxygen Delivery Method: Circle System Utilized Preoxygenation: Pre-oxygenation with 100% oxygen Induction Type: IV induction Ventilation: Mask ventilation without difficulty LMA: LMA inserted LMA Size: 4.0 Number of attempts: 1 Placement Confirmation: positive ETCO2 Tube secured with: Tape Dental Injury: Teeth and Oropharynx as per pre-operative assessment

## 2019-11-13 NOTE — Progress Notes (Signed)
OT Cancellation Note  Patient Details Name: Thomas Mcguire MRN: [id number] DOB: 10-Jul-1940   Cancelled Treatment:    Reason Eval/Treat Not Completed: Fatigue/lethargy limiting ability to participate- pt declines participation in OT today, reports plan for surgery today and voices "I need to be rested for surgery".  Will follow and see as able.   Jolaine Artist, OT Acute Rehabilitation Services Pager [phone] Office [phone]   [name] 11/13/2019, 10:35 AM

## 2019-11-13 NOTE — Interval H&P Note (Signed)
History and Physical Interval Note:  11/13/2019 2:59 PM  Thomas Mcguire  has presented today for surgery, with the diagnosis of ESRD.  The various methods of treatment have been discussed with the patient and family. After consideration of risks, benefits and other options for treatment, the patient has consented to  Procedure(s): RIGHT SECOND STAGE [place] (Right) as a surgical intervention.  The patient's history has been reviewed, patient examined, no change in status, stable for surgery.  I have reviewed the patient's chart and labs.  Questions were answered to the patient's satisfaction.     Curt Jews

## 2019-11-13 NOTE — Transfer of Care (Signed)
Immediate Anesthesia Transfer of Care Note  Patient: Thomas Mcguire  Procedure(s) Performed: RIGHT SECOND STAGE [place] (Right )  Patient Location: PACU  Anesthesia Type:General  Level of Consciousness: awake, drowsy and patient cooperative  Airway & Oxygen Therapy: Patient Spontanous Breathing and Patient connected to face mask oxygen  Post-op Assessment: Report given to RN, Post -op Vital signs reviewed and stable and Patient moving all extremities  Post vital signs: Reviewed and stable  Last Vitals:  Vitals Value Taken Time  BP 109/45   Temp    Pulse 65   Resp 18   SpO2 91     Last Pain:  Vitals:   11/13/19 1300  TempSrc:   PainSc: 0-No pain         Complications: No complications documented.

## 2019-11-13 NOTE — Progress Notes (Signed)
Physical Therapy Treatment Patient Details Name: Thomas Mcguire MRN: [id number] DOB: 03-Jan-1941 Today's Date: 11/13/2019    History of Present Illness Pt is a 79 y/o male with PMH of CLL, anemia, CKD stage V, DM2, HTN, thrombocytopenia, presenting with increased leg swelling, fluid overload, progressive dyspnea. Admitted for fluid overload in setting of decompensated CKD stage V.     PT Comments    Patient received in bed, extremely difficult to motivate to participate in mobility today- keeps telling me that he will do it tomorrow after HD (very unlikely based on previous attempts/tolerance to HD thus far), tells me that all he does is sit around at home anyway but then states he is very get up and go and never sits around. Really took a significant amount of effort and education regarding mobility/activity when new to HD, importance of mobility, and new mindset/techniques for energy conservation with mobility as well as detrimental effects of laying in bed. Finally able to convince him to participate in bed level exercise program today but very fatigued. Unable to convince him to participate with EOB/OOB mobility today. Left in bed with all needs met, RN present/attending, bed alarm active. Changed PT reccs to HHPT to accommodate HD related fatigue levels.     Follow Up Recommendations  Supervision for mobility/OOB;Home health PT     Equipment Recommendations  Rolling walker with 5" wheels    Recommendations for Other Services       Precautions / Restrictions Precautions Precautions: Fall;Other (comment) Precaution Comments: watch 02 Restrictions Weight Bearing Restrictions: No    Mobility  Bed Mobility               General bed mobility comments: refused  Transfers                 General transfer comment: refused  Ambulation/Gait             General Gait Details: refused   Stairs             Wheelchair Mobility    Modified Rankin (Stroke  Patients Only)       Balance Overall balance assessment: Needs assistance Sitting-balance support: Feet supported;No upper extremity supported Sitting balance-Leahy Scale: Fair     Standing balance support: During functional activity Standing balance-Leahy Scale: Poor Standing balance comment: preference to UE support                             Cognition Arousal/Alertness: Awake/alert Behavior During Therapy: WFL for tasks assessed/performed Overall Cognitive Status: Within Functional Limits for tasks assessed                                 General Comments: very low energy, difficult to motivate for mobility based tasks today- kept deflecting and changing topic      Exercises      General Comments        Pertinent Vitals/Pain Pain Assessment: No/denies pain    Home Living                      Prior Function            PT Goals (current goals can now be found in the care plan section) Acute Rehab PT Goals Patient Stated Goal: to feel better PT Goal Formulation: With patient Time For Goal Achievement: 11/22/19 Potential to  Achieve Goals: Good Progress towards PT goals: Progressing toward goals (slowly)    Frequency    Min 3X/week      PT Plan Discharge plan needs to be updated    Co-evaluation              AM-PAC PT "6 Clicks" Mobility   Outcome Measure  Help needed turning from your back to your side while in a flat bed without using bedrails?: None Help needed moving from lying on your back to sitting on the side of a flat bed without using bedrails?: A Little Help needed moving to and from a bed to a chair (including a wheelchair)?: A Little Help needed standing up from a chair using your arms (e.g., wheelchair or bedside chair)?: A Little Help needed to walk in hospital room?: A Little Help needed climbing 3-5 steps with a railing? : A Lot 6 Click Score: 18    End of Session Equipment Utilized During  Treatment: Gait belt Activity Tolerance: Patient limited by fatigue Patient left: in bed;with call bell/phone within reach;with bed alarm set;with nursing/sitter in room Nurse Communication: Mobility status PT Visit Diagnosis: Unsteadiness on feet (R26.81);Difficulty in walking, not elsewhere classified (R26.2);Muscle weakness (generalized) (M62.81)     Time: 8498-6516 PT Time Calculation (min) (ACUTE ONLY): 23 min  Charges:  $Therapeutic Exercise: 8-22 mins $Self Care/Home Management: 8-22                     Windell Norfolk, DPT, PN1   Supplemental Physical Therapist [place]    Pager [phone] Acute Rehab Office [phone]

## 2019-11-13 NOTE — Op Note (Signed)
    OPERATIVE REPORT  DATE OF SURGERY: 11/13/2019  PATIENT: Thomas Mcguire, 79 y.o. male MRN: [id number]  DOB: [birth year]  PRE-OPERATIVE DIAGNOSIS: Stage renal disease  POST-OPERATIVE DIAGNOSIS:  Same  PROCEDURE: Second stage basilic vein transposition  SURGEON:  Curt Jews, M.D.  PHYSICIAN ASSISTANT: Setzer PAC, Eveland PA-C  The assistant was needed for exposure and to expedite the case  ANESTHESIA: LMA  EBL: per anesthesia record  Total I/O In: 403 [I.V.:403] Out: -   BLOOD ADMINISTERED: none  DRAINS: none  SPECIMEN: none  COUNTS CORRECT:  YES  PATIENT DISPOSITION:  PACU - hemodynamically stable  PROCEDURE DETAILS: Patient was taken operating placed to position with area of the right arm right axilla were prepped draped you sterile fashion.  Incision was made over the prior antecubital incision carried down to isolate the brachial artery to basilic vein anastomosis.  The vein was very large caliber.  Separate incisions were made in the mid upper arm and axilla.  The vein was mobilized circumferentially.  Multiple branches were ligated with 3-0 silk ties and divided.  The vein was occluded near the brachial artery anastomosis and the vein was transected.  The vein was brought out through the tunnel.  A new subcutaneous tunnel was created from the antecubital space to the axilla.  The vein was brought back through this tunnel after gentle dilatation.  Care was taken not to twist the vein.  The vein was spatulated and sewn into in to the old vein just distal to the brachial artery anastomosis with a running 6-0 Prolene suture.  Clamps removed and excellent thrill was noted.  The wounds irrigated with saline.  Hemostasis was obtained electrocautery.  Wounds were closed with 3-0 Vicryl in the subcutaneous and subcuticular tissue.  Sterile dressing was applied and the patient was transferred to the recovery room in stable condition   Thomas Mcguire, M.D., [facility] 11/13/2019 6:13  PM

## 2019-11-14 ENCOUNTER — Encounter (HOSPITAL_COMMUNITY): Payer: Self-pay | Admitting: Vascular Surgery

## 2019-11-14 DIAGNOSIS — N186 End stage renal disease: Secondary | ICD-10-CM

## 2019-11-14 DIAGNOSIS — Z992 Dependence on renal dialysis: Secondary | ICD-10-CM

## 2019-11-14 LAB — BASIC METABOLIC PANEL
Anion gap: 15 (ref 5–15)
BUN: 76 mg/dL — ABNORMAL HIGH (ref 8–23)
CO2: 23 mmol/L (ref 22–32)
Calcium: 7.1 mg/dL — ABNORMAL LOW (ref 8.9–10.3)
Chloride: 94 mmol/L — ABNORMAL LOW (ref 98–111)
Creatinine, Ser: 6.89 mg/dL — ABNORMAL HIGH (ref 0.61–1.24)
GFR, Estimated: 8 mL/min — ABNORMAL LOW (ref >60)
Glucose, Bld: 298 mg/dL — ABNORMAL HIGH (ref 70–99)
Potassium: 5.1 mmol/L (ref 3.5–5.1)
Sodium: 132 mmol/L — ABNORMAL LOW (ref 135–145)

## 2019-11-14 LAB — CBC
HCT: 26.7 % — ABNORMAL LOW (ref 39.0–52.0)
Hemoglobin: 7.9 g/dL — ABNORMAL LOW (ref 13.0–17.0)
MCH: 25.7 pg — ABNORMAL LOW (ref 26.0–34.0)
MCHC: 29.6 g/dL — ABNORMAL LOW (ref 30.0–36.0)
MCV: 87 fL (ref 80.0–100.0)
Platelets: 173 K/uL (ref 150–400)
RBC: 3.07 MIL/uL — ABNORMAL LOW (ref 4.22–5.81)
RDW: 17.8 % — ABNORMAL HIGH (ref 11.5–15.5)
WBC: 22.8 K/uL — ABNORMAL HIGH (ref 4.0–10.5)
nRBC: 0 % (ref 0.0–0.2)

## 2019-11-14 LAB — GLUCOSE, CAPILLARY: Glucose-Capillary: 257 mg/dL — ABNORMAL HIGH (ref 70–99)

## 2019-11-14 MED ORDER — HEPARIN SODIUM (PORCINE) 1000 UNIT/ML IJ SOLN
INTRAMUSCULAR | Status: AC
  Administered 2019-11-14: 3800 [IU] via INTRAVENOUS
  Filled 2019-11-14: qty 4

## 2019-11-14 MED ORDER — DOXERCALCIFEROL 4 MCG/2ML IV SOLN
INTRAVENOUS | Status: AC
  Administered 2019-11-14: 2 ug via INTRAVENOUS
  Filled 2019-11-14: qty 2

## 2019-11-14 MED ORDER — HEPARIN SODIUM (PORCINE) 1000 UNIT/ML IJ SOLN: 1000.0000 [IU] | INTRAMUSCULAR | Status: DC

## 2019-11-14 MED ORDER — INSULIN NPH (HUMAN) (ISOPHANE) 100 UNIT/ML ~~LOC~~ SUSP: 50.0000 [IU] | Freq: Every day | SUBCUTANEOUS | 11 refills | Status: AC

## 2019-11-14 NOTE — Progress Notes (Signed)
Patient return to room from HD. VSS. Maintaining 99% on room air. Patient/family states that he doesn't use O2 at home. Patient request to eat either a late lunch or dinner before d/c.

## 2019-11-14 NOTE — Progress Notes (Signed)
PT Cancellation Note  Patient Details Name: INDIANA PECHACEK MRN: [id number] DOB: 12-18-1940   Cancelled Treatment:    Reason Eval/Treat Not Completed: Other (comment) Attempted to see pt this morning but just left for HD.  Will f/u as able. Abran Richard, PT Acute Rehab Services Pager [phone] [facility] [place] 11/14/2019, 10:10 AM

## 2019-11-14 NOTE — Discharge Summary (Signed)
Physician Discharge Summary  Thomas Mcguire IRC:[id number] DOB: [birth year] DOA: 11/07/2019  PCP: Leanna Battles, MD  Admit date: 11/07/2019 Discharge date: 11/14/2019  Admitted From: home Discharge disposition: home   Recommendations for Outpatient Follow-Up:   1. Start HD at outpatient center on 11/4   Discharge Diagnosis:   Active Problems:   Folic acid deficiency   Chronic lymphocytic leukemia (CLL), B-cell (HCC)   CKD (chronic kidney disease)   Fluid overload   Cardiomegaly   DM2 (diabetes mellitus, type 2) ([place])    Discharge Condition: Improved.  Diet recommendation: renal/carb mod  Wound care: None.  Code status: Full.   History of Present Illness:   Thomas Mcguire is a 79 y.o. male with medical history significant of CLL, Anemia, CKD stage V, DM 2, HTN, thrombocytopenia    Presented with   increasing leg swelling, fluid overload progressive dyspnea,  Pt was at Vascular surgery office and given severe progression Case was discussed with nephrology who recommended patient to present to emergency department. Patient has right radiocephalic fistula created by Dr. Scot Dock in May 2020 but it was deemed unsalvageable by fistulogram and never matured. 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  Not uremic. -Tunneled HD catheter on 11/08/2019. First HD on 11/09/2019 -Outpatient HD at [facility]. -Right basilic vein transposition by vascular surgery today. -Possibly home on 11/3.  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  Fluid management with HD.   -Continue HD per nephrology -continue holding Coreg and Imdur with soft BP.  Anion gap metabolic acidosis: Likely due to azotemia.  Improved. Hyperphosphatemia-likely due to renal failure.  Resolved. -Per nephrology.  CLL/leukocytosis/ymphocytosis: Relatively stable. -Outpatient follow-up with hematology.  Controlled DM-2 with hyperglycemia: A1c 6.2%.  -resume home meds  Microcytic anemia/folic acid deficiency/anemia of renal disease: B/l Hgb~10>  8.8 (admit)> 8.6> 7.8.  Denies melena or hematochezia. -IV iron and Aranesp per nephrology. -Continue folic acid supplementation  Essential hypertension: soft BP with low DBP -Continue holding home BP meds  Chronic mild thrombocytopenia: Resolved. -Monitor intermittently     Medical Consultants:   Renal/vascular   Discharge Exam:   Vitals:   11/14/19 1130 11/14/19 1200  BP: (!) 125/58 (!) 134/56  Pulse: 70 70  Resp:    Temp:    SpO2:     Vitals:   11/14/19 1030 11/14/19 1100 11/14/19 1130 11/14/19 1200  BP: (!) 110/54 (!) 118/57 (!) 125/58 (!) 134/56  Pulse: 61 66 70 70  Resp:      Temp:      TempSrc:      SpO2: 98%     Weight:      Height:        General exam: Appears calm and comfortable.   The results of significant diagnostics from this hospitalization (including imaging, microbiology, ancillary and laboratory) are listed below for reference.     Procedures and Diagnostic Studies:   DG Chest Portable 1  View  Result Date: 11/07/2019 CLINICAL DATA:  79 year old male with renal failure. EXAM: PORTABLE CHEST 1 VIEW COMPARISON:  Chest radiograph dated 10/20/2019 FINDINGS: Cardiomegaly with vascular congestion and mild edema. No focal consolidation, pleural effusion, pneumothorax. Atherosclerotic calcification of the aorta. No acute osseous pathology. IMPRESSION: Cardiomegaly with mild CHF or fluid  overload. Electronically Signed   By: Anner Crete M.D.   On: 11/07/2019 21:35   VAS US DUPLEX DIALYSIS ACCESS (AVF, AVG)  Result Date: 11/07/2019 DIALYSIS ACCESS Reason for Exam: Routine follow up. Access Site: Right Upper Extremity. Access Type: Basilic vein transposition 10/01/2019 with ligation of right              radiocephalic AVF. Performing Technologist: Ronal Fear RVS, RCS  Examination Guidelines: A complete evaluation includes B-mode imaging, spectral Doppler, color Doppler, and power Doppler as needed of all accessible portions of each vessel. Unilateral testing is considered an integral part of a complete examination. Limited examinations for reoccurring indications may be performed as noted.  Findings: +--------------------+----------+-----------------+--------+ AVF                 PSV (cm/s)Flow Vol (mL/min)Comments +--------------------+----------+-----------------+--------+ Native artery inflow   256           902                +--------------------+----------+-----------------+--------+ AVF Anastomosis        647                              +--------------------+----------+-----------------+--------+  +------------+----------+-------------+----------+--------+ OUTFLOW VEINPSV (cm/s)Diameter (cm)Depth (cm)Describe +------------+----------+-------------+----------+--------+ Prox UA        157        0.66        0.80            +------------+----------+-------------+----------+--------+ Mid UA         215        0.70        1.13            +------------+----------+-------------+----------+--------+ Dist UA        310        0.60        1.23            +------------+----------+-------------+----------+--------+ AC Fossa       665        0.52        0.95            +------------+----------+-------------+----------+--------+   Summary: 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 *See table(s) above for measurements and  observations.  Diagnosing physician: Deitra Mayo MD Electronically signed by Deitra Mayo MD on 11/07/2019 at 2:53:42 PM.   --------------------------------------------------------------------------------   Final    ECHOCARDIOGRAM COMPLETE  Result Date: 11/08/2019    ECHOCARDIOGRAM REPORT   Patient Name:   Thomas Mcguire Date of Exam: 11/08/2019 Medical Rec #:  [id number]         Height:       78.0 in Accession #:    [id number]        Weight:       212.0 lb Date of Birth:  [birth year]         BSA:          2.314 m Patient Age:    18 years          BP:           129/51 mmHg Patient Gender: M  HR:           63 bpm. Exam Location:  Inpatient Procedure: 2D Echo, Cardiac Doppler and Color Doppler Indications:    I51.7 Cardiomegaly  History:        Patient has no prior history of Echocardiogram examinations.                 PAD; Risk Factors:Hypertension, Diabetes and Dyslipidemia. ESRD.  Sonographer:    Jonelle Sidle Dance Referring Phys: [id number]  1. Left ventricular ejection fraction, by estimation, is 35 to 40%. The left ventricle has moderately decreased function. The left ventricle demonstrates global hypokinesis. The left ventricular internal cavity size was mildly dilated. Left ventricular diastolic parameters are consistent with Grade II diastolic dysfunction (pseudonormalization).  2. Right ventricular systolic function is normal. The right ventricular size is normal. There is mildly elevated pulmonary artery systolic pressure. The estimated right ventricular systolic pressure is 31.5 mmHg.  3. The mitral valve is abnormal. Restriction of the posterior leaflet with moderate mitral valve regurgitation (PISA ERO 0.25 cm^2). No evidence of mitral stenosis.  4. The aortic valve is tricuspid. Aortic valve regurgitation is mild. No aortic stenosis is present.  5. Aortic dilatation noted. There is mild dilatation of the aortic root, measuring 40 mm.  6. Left  atrial size was severely dilated.  7. Right atrial size was mildly dilated.  8. The inferior vena cava is dilated in size with <50% respiratory variability, suggesting right atrial pressure of 15 mmHg. FINDINGS  Left Ventricle: Left ventricular ejection fraction, by estimation, is 35 to 40%. The left ventricle has moderately decreased function. The left ventricle demonstrates global hypokinesis. The left ventricular internal cavity size was mildly dilated. There is no left ventricular hypertrophy. Left ventricular diastolic parameters are consistent with Grade II diastolic dysfunction (pseudonormalization). Right Ventricle: The right ventricular size is normal. No increase in right ventricular wall thickness. Right ventricular systolic function is normal. There is mildly elevated pulmonary artery systolic pressure. The tricuspid regurgitant velocity is 2.69  m/s, and with an assumed right atrial pressure of 15 mmHg, the estimated right ventricular systolic pressure is 17.6 mmHg. Left Atrium: Left atrial size was severely dilated. Right Atrium: Right atrial size was mildly dilated. Pericardium: There is no evidence of pericardial effusion. Mitral Valve: The mitral valve is abnormal. There is mild calcification of the mitral valve leaflet(s). Mild mitral annular calcification. Moderate mitral valve regurgitation. No evidence of mitral valve stenosis. Tricuspid Valve: The tricuspid valve is normal in structure. Tricuspid valve regurgitation is mild. Aortic Valve: The aortic valve is tricuspid. Aortic valve regurgitation is mild. Aortic regurgitation PHT measures 335 msec. No aortic stenosis is present. Pulmonic Valve: The pulmonic valve was normal in structure. Pulmonic valve regurgitation is trivial. Aorta: Aortic dilatation noted. There is mild dilatation of the aortic root, measuring 40 mm. Venous: The inferior vena cava is dilated in size with less than 50% respiratory variability, suggesting right atrial pressure  of 15 mmHg. IAS/Shunts: No atrial level shunt detected by color flow Doppler.  LEFT VENTRICLE PLAX 2D LVIDd:         6.07 cm  Diastology LVIDs:         4.76 cm  LV e' medial:   5.44 cm/s LV PW:         1.36 cm  LV E/e' medial: 21.3 LV IVS:        0.94 cm LVOT diam:     2.50 cm LV SV:  88 LV SV Index:   38 LVOT Area:     4.91 cm  RIGHT VENTRICLE             IVC RV Basal diam:  3.56 cm     IVC diam: 3.38 cm RV Mid diam:    2.76 cm RV S prime:     12.50 cm/s TAPSE (M-mode): 2.0 cm LEFT ATRIUM              Index       RIGHT ATRIUM           Index LA diam:        6.30 cm  2.72 cm/m  RA Area:     21.10 cm LA Vol (A2C):   140.0 ml 60.50 ml/m RA Volume:   62.10 ml  26.84 ml/m LA Vol (A4C):   171.0 ml 73.89 ml/m LA Biplane Vol: 157.0 ml 67.84 ml/m  AORTIC VALVE LVOT Vmax:   71.25 cm/s LVOT Vmean:  47.850 cm/s LVOT VTI:    0.178 m AI PHT:      335 msec  AORTA Ao Root diam: 4.00 cm Ao Asc diam:  3.30 cm MITRAL VALVE                 TRICUSPID VALVE MV Area (PHT): 2.50 cm      TR Peak grad:   28.9 mmHg MV Decel Time: 303 msec      TR Vmax:        269.00 cm/s MR Peak grad:    92.5 mmHg MR Mean grad:    63.5 mmHg   SHUNTS MR Vmax:         481.00 cm/s Systemic VTI:  0.18 m MR Vmean:        376.5 cm/s  Systemic Diam: 2.50 cm MR PISA:         3.08 cm MR PISA Eff ROA: 25 mm MR PISA Radius:  0.70 cm MV E velocity: 116.00 cm/s MV A velocity: 44.90 cm/s MV E/A ratio:  2.58 Loralie Champagne MD Electronically signed by Loralie Champagne MD Signature Date/Time: 11/08/2019/3:45:08 PM    Final      Labs:   Basic Metabolic Panel: Recent Labs  Lab 11/08/19 0445 11/08/19 0445 11/09/19 0300 11/09/19 0300 11/10/19 0057 11/10/19 0057 11/11/19 0154 11/11/19 0154 11/12/19 1015 11/12/19 1015 11/13/19 1504 11/14/19 0248  NA 134*   < > 134*   < > 136  --  135  --  132*  --  133* 132*  K 4.3   < > 3.9   < > 3.9   < > 3.8   < > 3.7   < > 4.3 5.1  CL 99   < > 99   < > 99  --  97*  --  95*  --  96* 94*  CO2 18*   < > 17*   --  21*  --  25  --  20*  --   --  23  GLUCOSE 129*   < > 136*   < > 147*  --  147*  --  188*  --  166* 298*  BUN 158*   < > 168*   < > 125*  --  73*  --  86*  --  53* 76*  CREATININE 8.26*   < > 8.42*   < > 7.24*  --  5.55*  --  6.25*  --  6.80* 6.89*  CALCIUM 7.6*   < >  7.4*  --  7.8*  --  7.7*  --  6.8*  --   --  7.1*  MG 2.4  --  2.4  --  2.4  --  2.1  --   --   --   --   --   PHOS 6.2*  --  6.5*  --  5.0*  --  3.6  --  4.4  --   --   --    < > = values in this interval not displayed.   GFR Estimated Creatinine Clearance: 11.9 mL/min (A) (by C-G formula based on SCr of 6.89 mg/dL (H)). Liver Function Tests: Recent Labs  Lab 11/07/19 1902 11/07/19 1902 11/08/19 0445 11/09/19 0300 11/10/19 0057 11/11/19 0154 11/12/19 1015  AST 27  --  24  --   --   --   --   ALT 36  --  35 33  --   --   --   ALKPHOS 64  --  59  --   --   --   --   BILITOT 1.3*  --  1.0  --   --   --   --   PROT 5.9*  --  5.7*  --   --   --   --   ALBUMIN 3.0*   < > 3.0* 2.8* 2.9* 2.7* 2.4*   < > = values in this interval not displayed.   No results for input(s): LIPASE, AMYLASE in the last 168 hours. No results for input(s): AMMONIA in the last 168 hours. Coagulation profile Recent Labs  Lab 11/08/19 0759  INR 1.4*    CBC: Recent Labs  Lab 11/07/19 1902 11/07/19 1902 11/08/19 0445 11/08/19 0445 11/09/19 0300 11/09/19 0300 11/10/19 0057 11/11/19 0154 11/12/19 1015 11/13/19 1504 11/14/19 0248  WBC 16.7*   < > 13.7*  --  15.4*  --  17.5*  --  18.0*  --  22.8*  NEUTROABS 6.0  --  5.0  --   --   --  6.3  --   --   --   --   HGB 8.8*   < > 8.6*   < > 8.1*   < > 8.6* 8.2* 7.8* 9.5* 7.9*  HCT 28.3*   < > 27.5*   < > 25.7*   < > 27.7* 26.4* 25.6* 28.0* 26.7*  MCV 86.8   < > 84.4  --  83.4  --  83.7  --  85.3  --  87.0  PLT 142*   < > 132*  --  131*  --  158  --  161  --  173   < > = values in this interval not displayed.   Cardiac Enzymes: No results for input(s): CKTOTAL, CKMB, CKMBINDEX,  TROPONINI in the last 168 hours. BNP: Invalid input(s): POCBNP CBG: Recent Labs  Lab 11/13/19 1410 11/13/19 1746 11/13/19 1856 11/13/19 2153 11/14/19 0553  GLUCAP 165* 154* 170* 268* 257*   D-Dimer No results for input(s): DDIMER in the last 72 hours. Hgb A1c No results for input(s): HGBA1C in the last 72 hours. Lipid Profile No results for input(s): CHOL, HDL, LDLCALC, TRIG, CHOLHDL, LDLDIRECT in the last 72 hours. Thyroid function studies No results for input(s): TSH, T4TOTAL, T3FREE, THYROIDAB in the last 72 hours.  Invalid input(s): FREET3 Anemia work up No results for input(s): VITAMINB12, FOLATE, FERRITIN, TIBC, IRON, RETICCTPCT in the last 72 hours. Microbiology Recent Results (from the past 240 hour(s))  Respiratory Panel  by RT PCR (Flu A&B, Covid) - Nasopharyngeal Swab     Status: None   Collection Time: 11/07/19  9:06 PM   Specimen: Nasopharyngeal Swab  Result Value Ref Range Status   SARS Coronavirus 2 by RT PCR NEGATIVE NEGATIVE Final    Comment: (NOTE) SARS-CoV-2 target nucleic acids are NOT DETECTED.  The SARS-CoV-2 RNA is generally detectable in upper respiratoy specimens during the acute phase of infection. The lowest concentration of SARS-CoV-2 viral copies this assay can detect is 131 copies/mL. A negative result does not preclude SARS-Cov-2 infection and should not be used as the sole basis for treatment or other patient management decisions. A negative result may occur with  improper specimen collection/handling, submission of specimen other than nasopharyngeal swab, presence of viral mutation(s) within the areas targeted by this assay, and inadequate number of viral copies (<131 copies/mL). A negative result must be combined with clinical observations, patient history, and epidemiological information. The expected result is Negative.  Fact Sheet for Patients:  PinkCheek.be  Fact Sheet for Healthcare Providers:   GravelBags.it  This test is no t yet approved or cleared by the [place] FDA and  has been authorized for detection and/or diagnosis of SARS-CoV-2 by FDA under an Emergency Use Authorization (EUA). This EUA will remain  in effect (meaning this test can be used) for the duration of the COVID-19 declaration under Section 564(b)(1) of the Act, 21 U.S.C. section 360bbb-3(b)(1), unless the authorization is terminated or revoked sooner.     Influenza A by PCR NEGATIVE NEGATIVE Final   Influenza B by PCR NEGATIVE NEGATIVE Final    Comment: (NOTE) The Xpert Xpress SARS-CoV-2/FLU/RSV assay is intended as an aid in  the diagnosis of influenza from Nasopharyngeal swab specimens and  should not be used as a sole basis for treatment. Nasal washings and  aspirates are unacceptable for Xpert Xpress SARS-CoV-2/FLU/RSV  testing.  Fact Sheet for Patients: PinkCheek.be  Fact Sheet for Healthcare Providers: GravelBags.it  This test is not yet approved or cleared by the [place] FDA and  has been authorized for detection and/or diagnosis of SARS-CoV-2 by  FDA under an Emergency Use Authorization (EUA). This EUA will remain  in effect (meaning this test can be used) for the duration of the  Covid-19 declaration under Section 564(b)(1) of the Act, 21  U.S.C. section 360bbb-3(b)(1), unless the authorization is  terminated or revoked. Performed at [facility], [place] [address] [place], [place] [address]      Discharge Instructions:   Discharge Instructions    Discharge instructions   Complete by: As directed    Renal/carb mod   Discharge wound care:   Complete by: As directed    Per vascular   Increase activity slowly   Complete by: As directed      Allergies as of 11/14/2019   No Known Allergies     Medication List    STOP taking these medications   allopurinol 100 MG  tablet Commonly known as: ZYLOPRIM   amLODipine 5 MG tablet Commonly known as: NORVASC   aspirin 81 MG tablet   carvedilol 25 MG tablet Commonly known as: COREG   COD LIVER OIL PO   doxazosin 2 MG tablet Commonly known as: CARDURA   ferrous sulfate 325 (65 FE) MG tablet   furosemide 80 MG tablet Commonly known as: LASIX   insulin regular 100 units/mL injection Commonly known as: NOVOLIN R   isosorbide mononitrate 30 MG 24 hr tablet Commonly  known as: IMDUR   potassium chloride 10 MEQ tablet Commonly known as: KLOR-CON   sevelamer carbonate 800 MG tablet Commonly known as: RENVELA     TAKE these medications   calcitRIOL 0.25 MCG capsule Commonly known as: ROCALTROL Take 0.25 mcg by mouth every Monday, Wednesday, and Friday.   cyanocobalamin 1000 MCG/ML injection Commonly known as: (VITAMIN B-12) Vitamin B12 injection: 1000 mcg (1 mL) by subcu injection once daily for 7 days; then once weekly for 4 weeks; then once monthly thereafter.  7 07/15/2010 What changed:   how much to take  how to take this  when to take this  additional instructions   folic acid 1 MG tablet Commonly known as: FOLVITE TAKE 1 TABLET BY MOUTH EVERY DAY   insulin NPH Human 100 UNIT/ML injection Commonly known as: NOVOLIN N Inject 0.5 mLs (50 Units total) into the skin daily before breakfast. What changed: when to take this   pravastatin 40 MG tablet Commonly known as: PRAVACHOL Take 40 mg by mouth at bedtime.            Discharge Care Instructions  (From admission, onward)         Start     Ordered   11/14/19 0000  Discharge wound care:       Comments: Per vascular   11/14/19 1217          Follow-up Information    Leanna Battles, MD Follow up in 1 week(s).   Specialty: Internal Medicine Contact information: [address] [place] [address] [phone]                Time coordinating discharge: 35 min  Signed:  Geradine Girt DO  [facility] 11/14/2019, 12:17 PM

## 2019-11-14 NOTE — Care Management Important Message (Signed)
Important Message  Patient Details  Name: Thomas Mcguire MRN: [id number] Date of Birth: 1940/12/10   Medicare Important Message Given:  Yes - Important Message mailed due to current National Emergency  Verbal consent obtained due to current National Emergency  Relationship to patient: Self Contact Name: Eugene Isadore Call Date: 11/14/19  Time: 0905 Phone: [phone] Outcome: Spoke with contact Important Message mailed to: Patient address on file    Delorse Lek 11/14/2019, 9:05 AM

## 2019-11-14 NOTE — Progress Notes (Signed)
Renal Navigator spoke with Nephrologist about plan for patient. He reports need for short HD today due to volume and O2. He states patient should be able to discharge after HD today from his perspective if he can come off O2.  Navigator then met with patient at HD bedside to review plan if successfully discharged today. Patient was pleasant and usual, and states surgery went well yesterday. He is hopeful for discharge today and states understanding that he will start at his outpatient HD clinic tomorrow if he is discharged today. He states no questions or concerns.  Renal Navigator discussed plan with Attending/Dr. Eliseo Squires who confirms discharge today, as long as O2 comes off. Navigator asked Renal PA to send orders to [facility] to prepare for in-center start tomorrow and updated clinic.  Thomas Mcguire, [place] Renal Navigator  [phone]

## 2019-11-14 NOTE — Anesthesia Postprocedure Evaluation (Signed)
Anesthesia Post Note  Patient: Thomas Mcguire  Procedure(s) Performed: RIGHT SECOND STAGE BASCILIC VEIN TRANSPOSITION (Right )     Patient location during evaluation: PACU Anesthesia Type: General Level of consciousness: awake and alert Pain management: pain level controlled Vital Signs Assessment: post-procedure vital signs reviewed and stable Respiratory status: spontaneous breathing, nonlabored ventilation, respiratory function stable and patient connected to nasal cannula oxygen Cardiovascular status: blood pressure returned to baseline and stable Postop Assessment: no apparent nausea or vomiting Anesthetic complications: no   No complications documented.  Last Vitals:  Vitals:   11/13/19 1848 11/13/19 2347  BP: (!) 106/49 (!) 109/51  Pulse: 61 61  Resp: 16 18  Temp: 37 C 36.6 C  SpO2: 95% 94%    Last Pain:  Vitals:   11/13/19 2347  TempSrc: Oral  PainSc:                  Bush Murdoch DAVID

## 2019-11-14 NOTE — Progress Notes (Signed)
[facility] [place]  Subjective  - Soreness in the right UE.  No complaints of loss of sensation or motor.   Objective (!) 105/45 64 97.9 F (36.6 C) (Oral) 18 96%  Intake/Output Summary (Last 24 hours) at 11/14/2019 0754 Last data filed at 11/13/2019 1800 Gross per 24 hour  Intake 453 ml  Output 0 ml  Net 453 ml    Incisions healing well, palpable thrill in fistula. Distally grip 5/5, sensation intact. Lungs non labored breathing  Assessment/Planning: POD # 1 Basilic transposition  First stage basilic completed on 0/16/55 the fistula may be accessed for HD by 12/31/19 he has a working [facility] and is currently on HD.  Call with any questions or concerns we will be available.    F/U PRN  Roxy Horseman 11/14/2019 7:54 AM --  Laboratory Lab Results: Recent Labs    11/12/19 1015 11/12/19 1015 11/13/19 1504 11/14/19 0248  WBC 18.0*  --   --  22.8*  HGB 7.8*   < > 9.5* 7.9*  HCT 25.6*   < > 28.0* 26.7*  PLT 161  --   --  173   < > = values in this interval not displayed.   BMET Recent Labs    11/12/19 1015 11/12/19 1015 11/13/19 1504 11/14/19 0248  NA 132*   < > 133* 132*  K 3.7   < > 4.3 5.1  CL 95*   < > 96* 94*  CO2 20*  --   --  23  GLUCOSE 188*   < > 166* 298*  BUN 86*   < > 53* 76*  CREATININE 6.25*   < > 6.80* 6.89*  CALCIUM 6.8*  --   --  7.1*   < > = values in this interval not displayed.    COAG Lab Results  Component Value Date   INR 1.4 (H) 11/08/2019   INR 1.2 07/11/2018   INR 1.18 08/24/2011   No results found for: PTT

## 2019-11-14 NOTE — Discharge Instructions (Signed)
HD on Thursday as previously scheduled

## 2019-11-14 NOTE — Progress Notes (Signed)
   11/14/19 1200  OT Visit Information  Last OT Received On 11/14/19  Reason Eval/Treat Not Completed Patient at procedure or test/ unavailable (HD)  History of Present Illness Pt is a 79 y/o male with PMH of CLL, anemia, CKD stage V, DM2, HTN, thrombocytopenia, presenting with increased leg swelling, fluid overload, progressive dyspnea. Admitted for fluid overload in setting of decompensated CKD stage Thomas Mcguire, OT Acute Rehabilitation Services Pager: [phone] Office: [phone]

## 2019-11-14 NOTE — Progress Notes (Signed)
Subjective: Feels well today.  Denies shortness of breath.  Was placed on oxygen after surgery.  Denies any other complaints.  Underwent surgery without any issues.  Objective Vital signs in last 24 hours: Vitals:   11/14/19 1005 11/14/19 1013 11/14/19 1030 11/14/19 1100  BP: (!) 104/51 (!) 106/47 (!) 110/54 (!) 118/57  Pulse: (!) 50 (!) 51 61 66  Resp: 18     Temp: (!) 97.3 F (36.3 C)     TempSrc: Oral     SpO2: 99%  98%   Weight: 99.2 kg     Height:       Weight change:   Intake/Output Summary (Last 24 hours) at 11/14/2019 1145 Last data filed at 11/14/2019 0800 Gross per 24 hour  Intake 690 ml  Output 0 ml  Net 690 ml    Assessment/Plan: 79 year old BM  With DM, HTN and progressive CKD-  Now is time to initiate dialysis  1.Renal- longstanding CKD now ESRD.  Status post initiation of dialysis.  OP HD education started - has spot at [facility].  Second stage of DVT done yesterday.  Undergoing dialysis today to help with volume then can continue dialysis tomorrow in the outpatient setting..   2. Hypertension/volume  -volume overload significantly improved.  Does have some signs of volume overload with pulmonary crackles today and hypoxia.  Will remove fluid with dialysis today. 3. Anemia  - giving ESA  iron stores low, repleting as well 4. Bones- on Hectorol.  Corrected calcium is low.  Continue high calcium bath    Thomas Mcguire    Labs: Basic Metabolic Panel: Recent Labs  Lab 11/10/19 0057 11/10/19 0057 11/11/19 0154 11/11/19 0154 11/12/19 1015 11/13/19 1504 11/14/19 0248  NA 136   < > 135   < > 132* 133* 132*  K 3.9   < > 3.8   < > 3.7 4.3 5.1  CL 99   < > 97*   < > 95* 96* 94*  CO2 21*   < > 25  --  20*  --  23  GLUCOSE 147*   < > 147*   < > 188* 166* 298*  BUN 125*   < > 73*   < > 86* 53* 76*  CREATININE 7.24*   < > 5.55*   < > 6.25* 6.80* 6.89*  CALCIUM 7.8*   < > 7.7*  --  6.8*  --  7.1*  PHOS 5.0*  --  3.6  --  4.4  --   --    < > = values in  this interval not displayed.   Liver Function Tests: Recent Labs  Lab 11/07/19 1902 11/07/19 1902 11/08/19 0445 11/08/19 0445 11/09/19 0300 11/09/19 0300 11/10/19 0057 11/11/19 0154 11/12/19 1015  AST 27  --  24  --   --   --   --   --   --   ALT 36  --  35  --  33  --   --   --   --   ALKPHOS 64  --  59  --   --   --   --   --   --   BILITOT 1.3*  --  1.0  --   --   --   --   --   --   PROT 5.9*  --  5.7*  --   --   --   --   --   --  ALBUMIN 3.0*   < > 3.0*   < > 2.8*   < > 2.9* 2.7* 2.4*   < > = values in this interval not displayed.   No results for input(s): LIPASE, AMYLASE in the last 168 hours. No results for input(s): AMMONIA in the last 168 hours. CBC: Recent Labs  Lab 11/07/19 1902 11/07/19 1902 11/08/19 0445 11/08/19 0445 11/09/19 0300 11/09/19 0300 11/10/19 0057 11/11/19 0154 11/12/19 1015 11/13/19 1504 11/14/19 0248  WBC 16.7*   < > 13.7*   < > 15.4*   < > 17.5*  --  18.0*  --  22.8*  NEUTROABS 6.0  --  5.0  --   --   --  6.3  --   --   --   --   HGB 8.8*   < > 8.6*   < > 8.1*   < > 8.6*   < > 7.8* 9.5* 7.9*  HCT 28.3*   < > 27.5*   < > 25.7*   < > 27.7*   < > 25.6* 28.0* 26.7*  MCV 86.8   < > 84.4  --  83.4  --  83.7  --  85.3  --  87.0  PLT 142*   < > 132*   < > 131*   < > 158  --  161  --  173   < > = values in this interval not displayed.   Cardiac Enzymes: No results for input(s): CKTOTAL, CKMB, CKMBINDEX, TROPONINI in the last 168 hours. CBG: Recent Labs  Lab 11/13/19 1410 11/13/19 1746 11/13/19 1856 11/13/19 2153 11/14/19 0553  GLUCAP 165* 154* 170* 268* 257*    Iron Studies:  No results for input(s): IRON, TIBC, TRANSFERRIN, FERRITIN in the last 72 hours. Studies/Results: No results found. Medications: Infusions: . sodium chloride    . sodium chloride      Scheduled Medications: . Chlorhexidine Gluconate Cloth  6 each Topical Q0600  . [START ON 11/15/2019] darbepoetin (ARANESP) injection - DIALYSIS  100 mcg Intravenous Q  Thu-HD  . doxercalciferol  2 mcg Intravenous Q M,W,F-HD  . feeding supplement (NEPRO CARB STEADY)  237 mL Oral BID BM  . insulin aspart  0-6 Units Subcutaneous TID AC & HS  . insulin NPH Human  10 Units Subcutaneous QAC breakfast  . pravastatin  40 mg Oral QHS  . sodium chloride flush  3 mL Intravenous Q12H    have reviewed scheduled and prn medications.  Physical Exam: General: NAD, lying in bed Heart: RRR Lungs: Crackles in the bilateral bases, no increased work of breathing Abdomen: distended- non tender Extremities: 1+ pitting edema in the bilateral lower extremities Dialysis Access: right sided TDC-aVF covered   11/14/2019,11:45 AM  LOS: 7 days

## 2019-11-15 ENCOUNTER — Telehealth: Payer: Self-pay | Admitting: Physician Assistant

## 2019-11-15 NOTE — Telephone Encounter (Signed)
Transition of care contact from inpatient facility  Date of Discharge: 11/14/19 Date of Contact: 11/15/19 Method of contact: Phone  Attempted to contact patient to discuss transition of care from inpatient admission. Someone answered the phone but was unable to hear me and eventually hung up. A lot of background noise. Will attempt to call back at a later time.  Anice Paganini, PA-C 11/15/2019, 4:03 PM  [employer]

## 2019-11-16 ENCOUNTER — Telehealth: Payer: Self-pay | Admitting: Physician Assistant

## 2019-11-16 NOTE — Telephone Encounter (Signed)
Transition of care contact from inpatient facility  Date of Discharge: 11/14/19 Date of Contact: 11/16/19 Method of contact: Phone  Attempted to contact patient to discuss transition of care from inpatient admission. Patient did not answer the phone. Message was left on the patient's voicemail with call back number instructions.  Anice Paganini, PA-C 11/16/2019, 12:39 PM  [facility]

## 2019-11-23 ENCOUNTER — Other Ambulatory Visit (HOSPITAL_COMMUNITY): Payer: Medicare Other

## 2019-11-26 DIAGNOSIS — H9193 Unspecified hearing loss, bilateral: Secondary | ICD-10-CM | POA: Insufficient documentation

## 2019-11-26 DIAGNOSIS — H6123 Impacted cerumen, bilateral: Secondary | ICD-10-CM | POA: Insufficient documentation

## 2020-01-02 ENCOUNTER — Ambulatory Visit: Payer: Medicare Other | Admitting: Podiatry

## 2020-01-02 ENCOUNTER — Encounter: Payer: Self-pay | Admitting: Podiatry

## 2020-01-02 ENCOUNTER — Ambulatory Visit (INDEPENDENT_AMBULATORY_CARE_PROVIDER_SITE_OTHER): Payer: Medicare Other

## 2020-01-02 ENCOUNTER — Other Ambulatory Visit: Payer: Self-pay

## 2020-01-02 DIAGNOSIS — L03031 Cellulitis of right toe: Secondary | ICD-10-CM

## 2020-01-02 DIAGNOSIS — S99921A Unspecified injury of right foot, initial encounter: Secondary | ICD-10-CM

## 2020-01-02 DIAGNOSIS — L02611 Cutaneous abscess of right foot: Secondary | ICD-10-CM | POA: Diagnosis not present

## 2020-01-02 NOTE — Progress Notes (Signed)
Dg f 

## 2020-01-07 ENCOUNTER — Ambulatory Visit (INDEPENDENT_AMBULATORY_CARE_PROVIDER_SITE_OTHER): Payer: Self-pay | Admitting: Physician Assistant

## 2020-01-07 ENCOUNTER — Other Ambulatory Visit: Payer: Self-pay

## 2020-01-07 VITALS — BP 123/62 | HR 100 | Temp 98.7°F | Resp 20 | Ht 79.0 in | Wt 196.4 lb

## 2020-01-07 DIAGNOSIS — N186 End stage renal disease: Secondary | ICD-10-CM

## 2020-01-07 DIAGNOSIS — Z992 Dependence on renal dialysis: Secondary | ICD-10-CM

## 2020-01-07 NOTE — Progress Notes (Signed)
Subjective:   Patient ID: Thomas Mcguire, male   DOB: 79 y.o.   MRN: [id number]   HPI Patient presents stating that he appraised his right foot and there is been a slight opening and it is no longer draining but he wants to get it looked at it is hard to wear shoe gear comfortably.  Patient presents with caregiver and does have history of circulatory disease long-term diabetes   Review of Systems  All other systems reviewed and are negative.       Objective:  Physical Exam Vitals and nursing note reviewed.  Constitutional:      Appearance: He is well-developed and well-nourished.  Cardiovascular:     Pulses: Intact distal pulses.  Pulmonary:     Effort: Pulmonary effort is normal.  Musculoskeletal:        General: Normal range of motion.  Skin:    General: Skin is warm.  Neurological:     Mental Status: He is alert.     Vascular status found to be diminished with diminished PT DP pulses bilateral and distal flow.  Patient is found to have an abrasion on the right fifth metatarsal measuring about 5 x 5 mm with no current active drainage with slight redness around the area localized with crusted formation and discomfort with pressure.  Patient is mentally oriented x3     Assessment:  An abrasion of the right foot consistent with low-grade contusion with no active ulceration noted     Plan:  H&P reviewed condition recommended conservative care and dispensed a surgical shoe to wear as there is no pressure against the area.  This should heal uneventfully but I gave strict instructions of any drainage were to occur redness or getting larger or deeper he is to reappoint [place] immediately and if he shows any signs of systemic infection he is to go straight to the emergency room  X-rays dated today were negative for signs of there is any kind of osteolysis does indicate arthritis bilateral with moderate calcification of arterial structure distal

## 2020-01-07 NOTE — Progress Notes (Signed)
  POST OPERATIVE DIALYSIS ACCESS OFFICE NOTE    CC:  F/u for dialysis access surgery; 8 weeks post-op  HPI:  This is a 79 y.o. male who is s/p right second stage basilic vein transposition by Dr. Donnetta Hutching on 11/2/202 First stage performed on 10/01/2019 by Dr. Scot Dock.  He has no complaints today.  Denies hand pain.  No issues with tunneled catheter.  His wife accompanies him today. Tunneled HD catheter on 11/08/2019. First HD on 11/09/2019 Outpatient HD at [place].   No Known Allergies  Current Outpatient Medications  Medication Sig Dispense Refill  . calcitRIOL (ROCALTROL) 0.25 MCG capsule Take 0.25 mcg by mouth every Monday, Wednesday, and Friday.   5  . cyanocobalamin (,VITAMIN B-12,) 1000 MCG/ML injection Vitamin B12 injection: 1000 mcg (1 mL) by subcu injection once daily for 7 days; then once weekly for 4 weeks; then once monthly thereafter.  7 07/15/2010 (Patient taking differently: Inject 1,000 mcg into the muscle every 30 (thirty) days. ) 12 mL 1  . folic acid (FOLVITE) 1 MG tablet TAKE 1 TABLET BY MOUTH EVERY DAY (Patient taking differently: Take 1 mg by mouth daily. ) 90 tablet 1  . insulin NPH Human (NOVOLIN N) 100 UNIT/ML injection Inject 0.5 mLs (50 Units total) into the skin daily before breakfast. 10 mL 11  . pravastatin (PRAVACHOL) 40 MG tablet Take 40 mg by mouth at bedtime.      No current facility-administered medications for this visit.     ROS:  See HPI  Vitals:   01/07/20 0946  BP: 123/62  Pulse: 100  Resp: 20  Temp: 98.7 F (37.1 C)  SpO2: 100%    Physical Exam:  General appearance: Well-developed, well-nourished in no apparent distress Cardiac: Rate and rhythm are regular Respiratory: Nonlabored respirations Incision: Right upper arm incisions are all well-healed Extremities: 2+ right radial pulse.  Good bruit and thrill in upper arm fistula.  It is easily palpable along its course.  5 out of 5 hand grip strength.  Motor function and sensation  intact.  Assessment/Plan:   -pt does not have evidence of steal syndrome --May begin using right basilic vein fistula -Discontinue tunneled dialysis catheter at discretion of nephrology team -Follow-up as needed  Barbie Banner, PA-C 01/07/2020 9:08 AM [facility] [phone]  Clinic MD: Dr. Oneida Alar on call

## 2020-01-24 ENCOUNTER — Other Ambulatory Visit: Payer: Medicare Other

## 2020-01-24 ENCOUNTER — Ambulatory Visit: Payer: Medicare Other | Admitting: Oncology

## 2020-01-30 ENCOUNTER — Inpatient Hospital Stay: Payer: Medicare Other | Attending: Oncology

## 2020-01-30 ENCOUNTER — Other Ambulatory Visit: Payer: Self-pay

## 2020-01-30 ENCOUNTER — Inpatient Hospital Stay: Payer: Medicare Other | Admitting: Oncology

## 2020-01-30 VITALS — BP 110/49 | HR 95 | Temp 97.4°F | Resp 18 | Ht 79.0 in | Wt 190.1 lb

## 2020-01-30 DIAGNOSIS — N186 End stage renal disease: Secondary | ICD-10-CM | POA: Diagnosis not present

## 2020-01-30 DIAGNOSIS — L97519 Non-pressure chronic ulcer of other part of right foot with unspecified severity: Secondary | ICD-10-CM | POA: Diagnosis not present

## 2020-01-30 DIAGNOSIS — D649 Anemia, unspecified: Secondary | ICD-10-CM | POA: Diagnosis not present

## 2020-01-30 DIAGNOSIS — I120 Hypertensive chronic kidney disease with stage 5 chronic kidney disease or end stage renal disease: Secondary | ICD-10-CM | POA: Insufficient documentation

## 2020-01-30 DIAGNOSIS — D696 Thrombocytopenia, unspecified: Secondary | ICD-10-CM | POA: Diagnosis not present

## 2020-01-30 DIAGNOSIS — C9110 Chronic lymphocytic leukemia of B-cell type not having achieved remission: Secondary | ICD-10-CM | POA: Diagnosis present

## 2020-01-30 DIAGNOSIS — E1122 Type 2 diabetes mellitus with diabetic chronic kidney disease: Secondary | ICD-10-CM | POA: Insufficient documentation

## 2020-01-30 DIAGNOSIS — Z992 Dependence on renal dialysis: Secondary | ICD-10-CM | POA: Diagnosis not present

## 2020-01-30 LAB — CBC WITH DIFFERENTIAL (CANCER CENTER ONLY)
Abs Immature Granulocytes: 0 10*3/uL (ref 0.00–0.07)
Basophils Absolute: 0 10*3/uL (ref 0.0–0.1)
Basophils Relative: 0 %
Eosinophils Absolute: 0 10*3/uL (ref 0.0–0.5)
Eosinophils Relative: 0 %
HCT: 40.7 % (ref 39.0–52.0)
Hemoglobin: 12.5 g/dL — ABNORMAL LOW (ref 13.0–17.0)
Lymphocytes Relative: 73 %
Lymphs Abs: 10.8 10*3/uL — ABNORMAL HIGH (ref 0.7–4.0)
MCH: 26.5 pg (ref 26.0–34.0)
MCHC: 30.7 g/dL (ref 30.0–36.0)
MCV: 86.2 fL (ref 80.0–100.0)
Monocytes Absolute: 0.4 10*3/uL (ref 0.1–1.0)
Monocytes Relative: 3 %
Neutro Abs: 3.6 10*3/uL (ref 1.7–7.7)
Neutrophils Relative %: 24 %
Platelet Count: 102 10*3/uL — ABNORMAL LOW (ref 150–400)
RBC: 4.72 MIL/uL (ref 4.22–5.81)
RDW: 18.2 % — ABNORMAL HIGH (ref 11.5–15.5)
WBC Count: 14.8 10*3/uL — ABNORMAL HIGH (ref 4.0–10.5)
nRBC: 0 % (ref 0.0–0.2)

## 2020-01-30 NOTE — Progress Notes (Signed)
  [place] OFFICE PROGRESS NOTE   Diagnosis: CLL  INTERVAL HISTORY:   Thomas Mcguire returns for a scheduled visit.  He recently scraped his right foot against a counter and developed an abrasion at the right foot.  He is being treated with antibiotics by his primary provider.  He would like to be evaluated at the wound center.  No fever.  Good appetite.  He was admitted in October with volume overload and renal failure.  He is now on hemodialysis on a Tuesday, Thursday, and Saturday schedule via a right neck catheter.  A right arm vascular access has been created.  Objective:  Vital signs in last 24 hours:  Blood pressure (!) 110/49, pulse 95, temperature (!) 97.4 F (36.3 C), temperature source Tympanic, resp. rate 18, height 6\' 7"  (2.007 m), weight 190 lb 1.6 oz (86.2 kg), SpO2 100 %.    Lymphatics: 1-2 cm soft mobile bilateral cervical and scalene nodes.  1 cm left anterior submandibular versus submental node.  Bilateral 2-3 cm axillary, inguinal, and femoral nodes. Resp: Lungs clear bilaterally Cardio: Regular rate and rhythm GI: No hepatosplenomegaly, no mass, nontender Vascular: 1+ edema to lower leg bilaterally  Skin: Erythema with healing superficial abrasion at the dorsum of the right foot, several ulcers at the right third, fourth, and fifth toes.  Portacath/PICC-without erythema  Lab Results:  Lab Results  Component Value Date   WBC 14.8 (H) 01/30/2020   HGB 12.5 (L) 01/30/2020   HCT 40.7 01/30/2020   MCV 86.2 01/30/2020   PLT 102 (L) 01/30/2020   NEUTROABS PENDING 01/30/2020    CMP  Lab Results  Component Value Date   NA 132 (L) 11/14/2019   K 5.1 11/14/2019   CL 94 (L) 11/14/2019   CO2 23 11/14/2019   GLUCOSE 298 (H) 11/14/2019   BUN 76 (H) 11/14/2019   CREATININE 6.89 (H) 11/14/2019   CALCIUM 7.1 (L) 11/14/2019   PROT 5.7 (L) 11/08/2019   ALBUMIN 2.4 (L) 11/12/2019   AST 24 11/08/2019   ALT 33 11/09/2019   ALKPHOS 59 11/08/2019    BILITOT 1.0 11/08/2019   GFRNONAA 8 (L) 11/14/2019   GFRAA 12 (L) 09/20/2018     Medications: I have reviewed the patient's current medications.   Assessment/Plan: 1.Chronic lymphocytic leukemia 2.Anemia 3.Chronic renal failure, on hemodialysis 4.Vitamin L39, folic acid? Deficiency 5.Diabetes 6.Hypertension 7.  Thrombocytopenia    Disposition: Thomas Mcguire has chronic lymphocytic leukemia.  He has stable mild anemia and lymphocytosis.  The platelets are slightly lower today.  He has stable peripheral lymphadenopathy.  There is no indication for treating the CLL at present.  He has received the COVID-19 booster vaccine.  He is up-to-date on pneumococcal vaccines.  We will be sure he has received the influenza vaccine.  Mr. Favorite will be referred to the [place] wound center to evaluate the right foot ulcers.  He will return for an office visit in 4 months.  Betsy Coder, MD  01/30/2020  11:52 AM

## 2020-01-31 ENCOUNTER — Telehealth: Payer: Self-pay

## 2020-01-31 ENCOUNTER — Telehealth: Payer: Self-pay | Admitting: Oncology

## 2020-01-31 NOTE — Telephone Encounter (Signed)
Pt contacted per MD Sherrill's directive. Spoke with pt's wife and verified pt has had flu shot recently.

## 2020-01-31 NOTE — Telephone Encounter (Signed)
Spoke with pt's wife at her request. Pt's wife reports that she doesn't remember why MD Sherrill's office called her earlier today. Pt's wife states that she will discuss with husband whether he has had his influenza injection recently per MD Benay Spice. This RN reminded pt's wife that influenza injections can be obtained through multiple locations including PCP and [facility] if needed. Pt's wife verbalizes understanding.

## 2020-01-31 NOTE — Telephone Encounter (Signed)
Scheduled appointments per 1/19 los. Mailed updated calendar to patient.

## 2020-02-04 ENCOUNTER — Ambulatory Visit: Payer: Medicare Other | Admitting: Orthopedic Surgery

## 2020-02-06 ENCOUNTER — Other Ambulatory Visit: Payer: Self-pay

## 2020-02-06 ENCOUNTER — Encounter: Payer: Medicare Other | Attending: Internal Medicine | Admitting: Internal Medicine

## 2020-02-06 ENCOUNTER — Telehealth: Payer: Self-pay

## 2020-02-06 DIAGNOSIS — Z856 Personal history of leukemia: Secondary | ICD-10-CM | POA: Diagnosis not present

## 2020-02-06 DIAGNOSIS — I120 Hypertensive chronic kidney disease with stage 5 chronic kidney disease or end stage renal disease: Secondary | ICD-10-CM | POA: Insufficient documentation

## 2020-02-06 DIAGNOSIS — N185 Chronic kidney disease, stage 5: Secondary | ICD-10-CM | POA: Diagnosis not present

## 2020-02-06 DIAGNOSIS — E1152 Type 2 diabetes mellitus with diabetic peripheral angiopathy with gangrene: Secondary | ICD-10-CM | POA: Diagnosis not present

## 2020-02-06 DIAGNOSIS — I739 Peripheral vascular disease, unspecified: Secondary | ICD-10-CM

## 2020-02-06 DIAGNOSIS — L97518 Non-pressure chronic ulcer of other part of right foot with other specified severity: Secondary | ICD-10-CM | POA: Diagnosis not present

## 2020-02-06 DIAGNOSIS — I70211 Atherosclerosis of native arteries of extremities with intermittent claudication, right leg: Secondary | ICD-10-CM | POA: Diagnosis not present

## 2020-02-06 DIAGNOSIS — E1122 Type 2 diabetes mellitus with diabetic chronic kidney disease: Secondary | ICD-10-CM | POA: Insufficient documentation

## 2020-02-06 DIAGNOSIS — Z89422 Acquired absence of other left toe(s): Secondary | ICD-10-CM | POA: Diagnosis not present

## 2020-02-06 DIAGNOSIS — I70261 Atherosclerosis of native arteries of extremities with gangrene, right leg: Secondary | ICD-10-CM | POA: Insufficient documentation

## 2020-02-06 DIAGNOSIS — Z87891 Personal history of nicotine dependence: Secondary | ICD-10-CM | POA: Diagnosis not present

## 2020-02-06 DIAGNOSIS — E11621 Type 2 diabetes mellitus with foot ulcer: Secondary | ICD-10-CM | POA: Insufficient documentation

## 2020-02-06 NOTE — Progress Notes (Signed)
COMMODORE, BELLEW ([id number]) Visit Report for 02/06/2020 Abuse/Suicide Risk Screen Details Patient Name: Thomas Mcguire, Thomas Mcguire. Date of Service: 02/06/2020 10:30 AM Medical Record Number: [id number] Patient Account Number: [id number] Date of Birth/Sex: 1940/04/26 (79 y.o. M) Treating RN: Cornell Barman Primary Care Nakoma Gotwalt: Leanna Battles Other Clinician: Referring Arbie Reisz: Betsy Coder Treating Morganna Styles/Extender: Tito Dine in Treatment: 0 Abuse/Suicide Risk Screen Items Answer ABUSE RISK SCREEN: Has anyone close to you tried to hurt or harm you recentlyo No Do you feel uncomfortable with anyone in your familyo No Has anyone forced you do things that you didnot want to doo No Electronic Signature(s) Signed: 02/06/2020 5:40:58 PM By: Gretta Cool, BSN, RN, CWS, Kim RN, BSN Entered By: Gretta Cool, BSN, RN, CWS, Kim on 02/06/2020 11:25:08 Thomas Mcguire ([id number]) -------------------------------------------------------------------------------- Activities of Daily Living Details Patient Name: Thomas Mcguire, Thomas Mcguire. Date of Service: 02/06/2020 10:30 AM Medical Record Number: [id number] Patient Account Number: [id number] Date of Birth/Sex: October 25, 1940 (79 y.o. M) Treating RN: Cornell Barman Primary Care Verbena Boeding: Leanna Battles Other Clinician: Referring Patsy Zaragoza: Betsy Coder Treating Trinia Georgi/Extender: Tito Dine in Treatment: 0 Activities of Daily Living Items Answer Activities of Daily Living (Please select one for each item) Drive Automobile Completely Able Take Medications Completely Able Use Telephone Completely Able Care for Appearance Completely Able Use Toilet Completely Able Bath / Shower Completely Able Dress Self Completely Able Feed Self Completely Able Walk Completely Able Get In / Out Bed Completely Able Housework Completely Able Prepare Meals Completely Able Handle Money Completely Able Shop for Self Completely Able Electronic  Signature(s) Signed: 02/06/2020 5:40:58 PM By: Gretta Cool, BSN, RN, CWS, Kim RN, BSN Entered By: Gretta Cool, BSN, RN, CWS, Kim on 02/06/2020 11:25:23 Thomas Mcguire ([id number]) -------------------------------------------------------------------------------- Education Screening Details Patient Name: Thomas Mcguire, Thomas Mcguire. Date of Service: 02/06/2020 10:30 AM Medical Record Number: [id number] Patient Account Number: [id number] Date of Birth/Sex: 1940-10-10 (79 y.o. M) Treating RN: Cornell Barman Primary Care Cynde Menard: Leanna Battles Other Clinician: Referring Jaqlyn Gruenhagen: Betsy Coder Treating Kalev Temme/Extender: Tito Dine in Treatment: 0 Primary Learner Assessed: Patient Learning Preferences/Education Level/Primary Language Learning Preference: Explanation, Demonstration Highest Education Level: College or Above Preferred Language: English Cognitive Barrier Language Barrier: No Translator Needed: No Memory Deficit: No Emotional Barrier: No Cultural/Religious Beliefs Affecting Medical Care: No Physical Barrier Impaired Vision: No Impaired Hearing: No Decreased Hand dexterity: No Knowledge/Comprehension Knowledge Level: High Comprehension Level: High Ability to understand written instructions: High Ability to understand verbal instructions: High Motivation Anxiety Level: Calm Cooperation: Cooperative Education Importance: Acknowledges Need Interest in Health Problems: Asks Questions Perception: Coherent Willingness to Engage in Self-Management High Activities: Readiness to Engage in Self-Management High Activities: Engineer, maintenance) Signed: 02/06/2020 5:40:58 PM By: Gretta Cool, BSN, RN, CWS, Kim RN, BSN Entered By: Gretta Cool, BSN, RN, CWS, Kim on 02/06/2020 11:25:52 MORSE, BRUEGGEMANN ([id number]) -------------------------------------------------------------------------------- Fall Risk Assessment Details Patient Name: Thomas Mcguire. Date of Service: 02/06/2020 10:30  AM Medical Record Number: [id number] Patient Account Number: [id number] Date of Birth/Sex: Aug 11, 1940 (79 y.o. M) Treating RN: Cornell Barman Primary Care Virgie Kunda: Leanna Battles Other Clinician: Referring Aretha Levi: Betsy Coder Treating Celestia Duva/Extender: Tito Dine in Treatment: 0 Fall Risk Assessment Items Have you had 2 or more falls in the last 12 monthso 0 No Have you had any fall that resulted in injury in the last 12 monthso 0 No FALLS RISK SCREEN History of falling - immediate or within 3 months 0 No Secondary diagnosis (Do you have 2 or more medical diagnoseso) 0 No Ambulatory aid None/bed  rest/wheelchair/nurse 0 Yes Crutches/cane/walker 0 No Furniture 0 No Intravenous therapy Access/Saline/Heparin Lock 0 No Gait/Transferring Normal/ bed rest/ wheelchair 0 Yes Weak (short steps with or without shuffle, stooped but able to lift head while walking, may 0 No seek support from furniture) Impaired (short steps with shuffle, may have difficulty arising from chair, head down, impaired 0 No balance) Mental Status Oriented to own ability 0 Yes Electronic Signature(s) Signed: 02/06/2020 5:40:58 PM By: Gretta Cool, BSN, RN, CWS, Kim RN, BSN Entered By: Gretta Cool, BSN, RN, CWS, Kim on 02/06/2020 11:26:09 Thomas Mcguire ([id number]) -------------------------------------------------------------------------------- Foot Assessment Details Patient Name: Thomas Mcguire, Thomas Mcguire. Date of Service: 02/06/2020 10:30 AM Medical Record Number: [id number] Patient Account Number: [id number] Date of Birth/Sex: [birth year] (79 y.o. M) Treating RN: Cornell Barman Primary Care Haifa Hatton: Leanna Battles Other Clinician: Referring Kaeleen Odom: Betsy Coder Treating Diar Berkel/Extender: Tito Dine in Treatment: 0 Foot Assessment Items Site Locations + = Sensation present, - = Sensation absent, C = Callus, U = Ulcer R = Redness, W = Warmth, M = Maceration, PU = Pre-ulcerative lesion F =  Fissure, S = Swelling, D = Dryness Assessment Right: Left: Other Deformity: No No Prior Foot Ulcer: No No Prior Amputation: No No Charcot Joint: No No Ambulatory Status: Ambulatory Without Help Gait: Steady Notes Arrived in wheelchair, but walked into treatment room from hallway. Electronic Signature(s) Signed: 02/06/2020 5:40:58 PM By: Gretta Cool, BSN, RN, CWS, Kim RN, BSN Entered By: Gretta Cool, BSN, RN, CWS, Kim on 02/06/2020 11:28:28 Thomas Mcguire ([id number]) -------------------------------------------------------------------------------- Nutrition Risk Screening Details Patient Name: Thomas Mcguire, Thomas Mcguire. Date of Service: 02/06/2020 10:30 AM Medical Record Number: [id number] Patient Account Number: [id number] Date of Birth/Sex: 1940-08-19 (79 y.o. M) Treating RN: Cornell Barman Primary Care Leisa Gault: Leanna Battles Other Clinician: Referring Anet Logsdon: Betsy Coder Treating Gessica Jawad/Extender: Tito Dine in Treatment: 0 Height (in): 78 Weight (lbs): 196 Body Mass Index (BMI): 22.6 Nutrition Risk Screening Items Score Screening NUTRITION RISK SCREEN: I have an illness or condition that made me change the kind and/or amount of food I eat 0 No I eat fewer than two meals per day 0 No I eat few fruits and vegetables, or milk products 0 No I have three or more drinks of beer, liquor or wine almost every day 0 No I have tooth or mouth problems that make it hard for me to eat 0 No I don't always have enough money to buy the food I need 0 No I eat alone most of the time 0 No I take three or more different prescribed or over-the-counter drugs a day 1 Yes Without wanting to, I have lost or gained 10 pounds in the last six months 0 No I am not always physically able to shop, cook and/or feed myself 0 No Nutrition Protocols Good Risk Protocol 0 No interventions needed Moderate Risk Protocol High Risk Proctocol Risk Level: Good Risk Score: 1 Electronic Signature(s) Signed:  02/06/2020 5:40:58 PM By: Gretta Cool, BSN, RN, CWS, Kim RN, BSN Entered By: Gretta Cool, BSN, RN, CWS, Kim on 02/06/2020 44:01:02

## 2020-02-06 NOTE — Telephone Encounter (Signed)
Patient left VM about right foot wound and pain. Per wound care MD - Dr. Dellia Nims needs to be seen. Placed on PA schedule for evaluation and ABIs.

## 2020-02-07 NOTE — Progress Notes (Signed)
[name], [name] ([id number]) Visit Report for 02/06/2020 Chief Complaint Document Details Patient Name: Thomas Mcguire, Thomas Mcguire. Date of Service: 02/06/2020 10:30 AM Medical Record Number: [id number] Patient Account Number: [id number] Date of Birth/Sex: [birth year] (80 y.o. M) Treating RN: Cornell Barman Primary Care Provider: Leanna Battles Other Clinician: Referring Provider: Betsy Coder Treating Provider/Extender: Tito Dine in Treatment: 0 Information Obtained from: Patient Chief Complaint Right great toe ulcer 02/06/2020; patient is here for review of wounds on there his remaining toes on the right foot Electronic Signature(s) Signed: 02/07/2020 11:56:06 AM By: Linton Ham MD Entered By: Linton Ham on 02/06/2020 11:53:38 Thomas Mcguire ([id number]) -------------------------------------------------------------------------------- HPI Details Patient Name: Thomas Mcguire, Thomas Mcguire. Date of Service: 02/06/2020 10:30 AM Medical Record Number: [id number] Patient Account Number: [id number] Date of Birth/Sex: 03/22/1940 (80 y.o. 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 He is here for review of wound on the right lower leg. 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 He has chronic edema in his lower legs and uses 20-30 mm compression stockings which she is compliant with. He is a nonsmoker. Beside his diabetes he has hypertension and hypercholesterolemia and in 2013 he had stage III chronic renal failure. He tells me his primary physician Dr. Valetta Fuller at [facility] follows him every 3 months for his kidney failure.  He also states he saw a nephrologist once. ABIs in this clinic were noncompressible. He tells me he saw Dr. Doren Custard in 6 or 7 years ago at vein and vascular in [place]. He does not remember what the issue was. It would appear that the patient has had recurrent blistering in the lower extremities as there are several healed areas on the left anterior and right anterior calves 12/14/16 on evaluation today patient appears to be doing very well with the current three layer compression wraps. His right lower extremity is significantly improved as far as the swelling is concerned. He has been tolerating the wraps without any complication. The wounds also appear to be doing better compared to last week's evaluation. Unfortunately he does have a new dramatic wound to the left interior shin but otherwise things are looking up. No fevers chills noted. Patient does have a appointment with [facility] [place] on December 23, 2016 four arterial studies. 12/22/16 area x2 on right leg are closed. Still one open area on left leg. arterial studies tomorrow 12/28/16; 2 wounds on the right leg are closed. He still has one open area on the left leg. ARTERIAL studies were done on 12/23/16 D showed an ABI on the right of 0.99 and on the left at 0.68. His toe brachial index was 0.26 on the right. Monophasic waveforms at both the posterior tibial and dorsalis pedis arteries. The patient saw Dr. Trula Slade of vascular surgery yesterday. He stated he would follow him for the remaining wound on the left leg. He has been using silver alginate 01/12/17; the area on the left leg is closed. His original wounds on the right leg are closed as well. He has 20-30 mm compression stockings. He is following with Dr. Lita Mains ham for his arterial issues however his wounds are closed and he is tolerating 20-30 mm stockings  Readmission: 06/19/18 patient presents today for readmission into our clinic concerning issues having with his  right great toe which unfortunately started as a blister just the past week. He notes is not having any pain on that he does have neuropathy this is likely the big reason that he is not experiencing any discomfort in particular. Fortunately there's no signs of active infection at this time. No fevers, chills, nausea, or vomiting noted at this time. Patient's wound bed currently showed signs of necrotic tissue on the surface of the planned where he does have some blistering skin at the site where he has a callous. Fortunately there's no signs of systemic infection although he does have a red streak that is extending up from the great toe I'm unsure if this is related directly to an active infection at this time with this is something that should normally there the patient is unsure. He has previously undergone vascular testing but I'm unsure as to whether or not this has been repeated in since we last saw him. Fortunately he does still see [facility] vascular specialist in [place] and it appears they may have an arterial study ordered for him will be looking epic for later this month although we are gonna call and confirm this to be certain. I think that is something that we really do need to do. 06/26/18 on evaluation today patient actually appears to be doing poorly in the doses toe ulcer. Unfortunately this seems to be a dry SR/skin cream type situation that is developing. I am concerned. He has an appointment with vascular on the 25th of this month but I believe that this may need to be moved up. We will contact the office and try to get this moved up sooner. No fevers, chills, nausea, or vomiting noted at this time. READMISSION 02/06/2020 Patient is now a 80 year old type II diabetic who is here for wounds on his right and left leg in 2018 and then he was here for 2 visits for wounds on his right great toe in 2020. He was last here on 06/26/2018. The next week he came to the attention of Dr. Shanon Rosser at [facility] vein [facility] vascular. 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 On 07/11/2018 he had a right common femoral to below-knee popliteal bypass with amputation of the right great toe. His current problem started roughly 1 month ago he scraped his dorsal surface of his toes on the undersurface of a cabinet. Saw podiatry on 01/02/2020 and x-ray was negative they recommended Bactroban. He saw Dr. Benay Spice of oncology who follows him for chronic lymphocytic leukemia who recommended he come here. Past medical history includes type 2 diabetes with peripheral neuropathy, 3 left toe amputations in 2014 for second and third, chronic lower extremity edema, hypertension, hypercholesterolemia. 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 These were last done on 09/05/2019 noncompressible on both sides with but with monophasic waveforms bilaterally. Barely audible at the dorsalis pedis bilaterally. The patient describes claudication at rest pain at night or with minimal activity Electronic Signature(s) Signed: 02/07/2020 11:56:06 AM By: Linton Ham MD Entered By: Linton Ham on 02/06/2020 12:09:28 Thomas Mcguire, Thomas Mcguire ([id number]) Thomas Mcguire, Thomas Mcguire ([id number]) -------------------------------------------------------------------------------- Physical Exam Details Patient Name: Thomas Mcguire, Thomas Mcguire. Date of Service: 02/06/2020 10:30 AM Medical Record Number: [id number] Patient Account Number: [id number] Date of Birth/Sex: Nov 16, 1940 (80 y.o. M) Treating  RN: Cornell Barman Primary Care Provider: Leanna Battles Other Clinician: Referring Provider: Betsy Coder Treating Provider/Extender: Tito Dine in Treatment: 0 Constitutional Sitting or standing Blood Pressure is within target range for patient.. Pulse regular and within target range for  patient.Marland Kitchen Respirations regular, non- labored and within target range.. Temperature is normal and within the target range for the patient.Marland Kitchen appears in no distress. Cardiovascular Absent popliteal pulses however femoral pulse was palpable on the right. Pedal pulses absent on the right. Integumentary (Hair, Skin) Dry gangrene over the dorsal surfaces of the second third fourth toes and beginning on the fifth toe. He also has a small area of breakdown at the amputation site of his right first toe. Psychiatric No evidence of depression, anxiety, or agitation. Calm, cooperative, and communicative. Appropriate interactions and affect.. Notes Wound exam; dry gangrene over the dorsal surface of his toes as noted. Between his toes there are wounds developing here and apparently bloody drainage noted by his wife although I did not see anything that was believe Electronic Signature(s) Signed: 02/07/2020 11:56:06 AM By: Linton Ham MD Entered By: Linton Ham on 02/06/2020 12:10:41 Thomas Mcguire ([id number]) -------------------------------------------------------------------------------- Physician Orders Details Patient Name: Thomas Mcguire, Thomas Mcguire. Date of Service: 02/06/2020 10:30 AM Medical Record Number: [id number] Patient Account Number: [id number] Date of Birth/Sex: 1940-04-07 (80 y.o. M) Treating RN: Cornell Barman Primary Care Provider: Leanna Battles Other Clinician: Referring Provider: Betsy Coder Treating Provider/Extender: Tito Dine in Treatment: 0 Verbal / Phone Orders: No Diagnosis Coding Follow-up Appointments o Return Appointment in 1 week. Bathing/ Shower/ Hygiene o May shower; gently cleanse wound with antibacterial soap, rinse and pat dry prior to dressing wounds Wound Treatment Wound #4 - Toe Second Wound Laterality: Right, Circumferential Topical: Mupirocin Ointment 1 x Per Day/15 Days Discharge Instructions: Apply as directed by provider. Primary  Dressing: Gauze 1 x Per Day/15 Days Discharge Instructions: As directed: dry, moistened with saline or moistened with Dakins Solution Wound #5 - Toe Third Wound Laterality: Right, Circumferential Topical: Mupirocin Ointment 1 x Per GNO/03 Days Discharge Instructions: Apply as directed by provider. Primary Dressing: Gauze 1 x Per Day/15 Days Discharge Instructions: As directed: dry, moistened with saline or moistened with Dakins Solution Wound #6 - Toe Fourth Wound Laterality: Right, Circumferential Topical: Mupirocin Ointment 1 x Per BCW/88 Days Discharge Instructions: Apply as directed by provider. Primary Dressing: Gauze 1 x Per Day/15 Days Discharge Instructions: As directed: dry, moistened with saline or moistened with Dakins Solution Wound #7 - Toe Fifth Wound Laterality: Right, Circumferential Topical: Mupirocin Ointment 1 x Per QBV/69 Days Discharge Instructions: Apply as directed by provider. Primary Dressing: Gauze 1 x Per IHW/38 Days Discharge Instructions: As directed: dry, moistened with saline or moistened with Dakins Solution Consults o Vascular - Refer to Dr. Doren Custard for earlier appointment. Electronic Signature(s) Signed: 02/06/2020 5:40:58 PM By: Gretta Cool, BSN, RN, CWS, Kim RN, BSN Signed: 02/07/2020 11:56:06 AM By: Linton Ham MD Entered By: Gretta Cool, BSN, RN, CWS, Kim on 02/06/2020 11:39:14 Thomas Mcguire, Thomas Mcguire ([id number]) -------------------------------------------------------------------------------- Problem List Details Patient Name: Thomas Mcguire, Thomas Mcguire. Date of Service: 02/06/2020 10:30 AM Medical Record Number: [id number] Patient Account Number: [id number] Date of Birth/Sex: [birth year] (80 y.o. 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 Date of Service: 02/06/2020 10:30 AM Medical Record Number: [id number] Patient Account Number: [id number] Date of Birth/Sex: Mar 23, 1940 (80 y.o. 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 He is here for review of wound on the right lower leg. 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 He has chronic edema in his lower legs and uses 20-30 mm compression stockings which she is compliant with. He is a nonsmoker. Beside his diabetes he has hypertension and hypercholesterolemia and in 2013 he had stage III chronic renal failure. He tells me his primary physician Dr. Valetta Fuller at [facility] follows him every 3 months for his kidney failure. He also states he saw a nephrologist once. ABIs in this clinic were noncompressible. He tells  me he saw Dr. Doren Custard in 6 or 7 years ago at vein and vascular in [place]. He does not remember what the issue was. It would appear that the patient has had recurrent blistering in the lower extremities as there are several healed areas on the left anterior and right anterior calves 12/14/16 on evaluation today patient appears to be doing very well with the current three layer compression wraps. His right lower extremity is significantly improved as far as the swelling is concerned. He has been tolerating the wraps without any complication. The wounds also appear to be doing better compared to last week's evaluation. Unfortunately he does have a new dramatic wound to the left interior shin but otherwise things are looking up. No fevers chills noted. Patient does have a appointment with [facility] [place] on December 23, 2016 four arterial studies. 12/22/16 area x2 on right leg are closed. Still one open area on left leg. arterial studies tomorrow 12/28/16; 2 wounds on the right leg are closed. He still has one open area on the left leg. ARTERIAL studies were done on 12/23/16 D showed an ABI on the right of 0.99 and on the left at 0.68. His toe brachial index was 0.26 on the right. Monophasic waveforms at both the posterior tibial and dorsalis pedis arteries. The patient saw Dr. Trula Slade of vascular surgery yesterday. He stated he would follow him for the remaining wound on the left leg. He has been using silver alginate 01/12/17; the area on the left leg is closed. His original wounds on the right leg are closed as well. He has 20-30 mm compression stockings. He is following with Dr. Lita Mains ham for his arterial issues however his wounds are closed and he is tolerating 20-30 mm stockings Readmission: 06/19/18 patient presents today for readmission into our clinic concerning issues having with his right great toe which unfortunately started as a blister just the past week.  He notes is not  having any pain on that he does have neuropathy this is likely the big reason that he is not experiencing any discomfort in particular. Fortunately there's no signs of active infection at this time. No fevers, chills, nausea, or vomiting noted at this time. Patient's wound bed currently showed signs of necrotic tissue on the surface of the planned where he does have some blistering skin at the site where he has a callous. Fortunately there's no signs of systemic infection although he does have a red streak that is extending up from the great toe I'm unsure if this is related directly to an active infection at this time with this is something that should normally there the patient is unsure. He has previously undergone vascular testing but I'm unsure as to whether or not this has been repeated in since we last saw him. Fortunately he does still see veiny vascular specialist in [place] and it appears they may have an arterial study ordered for him will be looking epic for later this month although we are gonna call and confirm this to be certain. I think that is something that we really do need to do. 06/26/18 on evaluation today patient actually appears to be doing poorly in the doses toe ulcer. Unfortunately this seems to be a dry SR/skin cream type situation that is developing. I am concerned. He has an appointment with vascular on the 25th of this month but I believe that this may need to be moved up. We will contact the office and try to get this moved up sooner. No fevers, chills, nausea, or vomiting noted at this time. READMISSION 02/06/2020 Patient is now a 80 year old type II diabetic who is here for wounds on his right and left leg in 2018 and then he was here for 2 visits for wounds on his right great toe in 2020. He was last here on 06/26/2018. The next week he came to the attention of Dr. Shanon Rosser at [facility] vein and vascular. He had an aortogram that showed occlusion of the right  SFA one-vessel runoff via the peroneal. On 07/11/2018 he had a right common femoral to below-knee popliteal bypass with amputation of the right great toe. His current problem started roughly 1 month ago he scraped his dorsal surface of his toes on the undersurface of a cabinet. Saw podiatry on 01/02/2020 and x-ray was negative they recommended Bactroban. He saw Dr. Benay Spice of oncology who follows him for chronic lymphocytic leukemia who recommended he come here. Past medical history includes type 2 diabetes with peripheral neuropathy, 3 left toe amputations in 2014 for second and third, chronic lower extremity edema, hypertension, hypercholesterolemia. 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 These were last done on 09/05/2019 noncompressible on both sides with but with monophasic waveforms bilaterally. Barely audible at the dorsalis pedis bilaterally. The patient describes claudication at rest pain at night or with minimal activity Patient History Thomas Mcguire, Thomas Mcguire ([id number]) Information obtained from Patient. Allergies No Known Drug Allergies Family History Diabetes - Mother,Father, Hypertension - Mother, No family history of Cancer, Heart Disease, Kidney Disease, Lung Disease, Seizures, Stroke, Thyroid Problems, Tuberculosis. Social History Former smoker - quit 24 years ago, Marital Status - Married, Alcohol Use - Never, Drug Use - No History, Caffeine Use - Daily. Medical History Eyes Patient has history of Cataracts - removed Denies history of Glaucoma, Optic Neuritis Ear/Nose/Mouth/Throat Denies history  of Chronic sinus problems/congestion, Middle ear problems Hematologic/Lymphatic Patient has history of Lymphedema Denies history of Anemia, Hemophilia, Human Immunodeficiency Virus, Sickle Cell Disease Respiratory Denies history of Aspiration, Asthma, Chronic Obstructive Pulmonary Disease  (COPD), Pneumothorax, Sleep Apnea, Tuberculosis Cardiovascular Patient has history of Hypertension, Peripheral Arterial Disease Denies history of Angina, Arrhythmia, Congestive Heart Failure, Coronary Artery Disease, Deep Vein Thrombosis, Myocardial Infarction, Peripheral Venous Disease, Phlebitis, Vasculitis Gastrointestinal Denies history of Cirrhosis , Colitis, Crohn s, Hepatitis A, Hepatitis B, Hepatitis C Endocrine Patient has history of Type II Diabetes - insulin dependant Genitourinary Patient has history of End Stage Renal Disease - Dialysis Tues, Thurs, Sat. Immunological Denies history of Lupus Erythematosus, Raynaud s, Scleroderma Integumentary (Skin) Denies history of History of Burn, History of pressure wounds Musculoskeletal Patient has history of Gout Denies history of Rheumatoid Arthritis, Osteoarthritis, Osteomyelitis Neurologic Denies history of Dementia, Neuropathy, Quadriplegia, Paraplegia, Seizure Disorder Oncologic Denies history of Received Chemotherapy, Received Radiation Psychiatric Denies history of Anorexia/bulimia, Confinement Anxiety Hospitalization/Surgery History - shut placement at [facility] 3 weeks ago. Review of Systems (ROS) Integumentary (Skin) Complains or has symptoms of Wounds. Objective Constitutional Sitting or standing Blood Pressure is within target range for patient.. Pulse regular and within target range for patient.Marland Kitchen Respirations regular, non- labored and within target range.. Temperature is normal and within the target range for the patient.Marland Kitchen appears in no distress. Vitals Time Taken: 10:59 AM, Height: 78 in, Weight: 196 lbs, BMI: 22.6, Temperature: 98.2 F, Pulse: 102 bpm, Respiratory Rate: 18 breaths/min, Blood Pressure: 122/65 mmHg. Cardiovascular Absent popliteal pulses however femoral pulse was palpable on the right. Pedal pulses absent on the right. Psychiatric No evidence of depression, anxiety, or agitation. Calm,  cooperative, and communicative. Appropriate interactions and affect.Thomas Mcguire, Thomas Mcguire ([id number]) General Notes: Wound exam; dry gangrene over the dorsal surface of his toes as noted. Between his toes there are wounds developing here and apparently bloody drainage noted by his wife although I did not see anything that was believe Integumentary (Hair, Skin) Dry gangrene over the dorsal surfaces of the second third fourth toes and beginning on the fifth toe. He also has a small area of breakdown at the amputation site of his right first toe. Wound #4 status is Open. Original cause of wound was Trauma. The wound is located on the Right,Circumferential Toe Second. The wound measures 1.6cm length x 6.7cm width x 0.1cm depth; 8.419cm^2 area and 0.842cm^3 volume. There is no tunneling or undermining noted. There is a medium amount of sanguinous drainage noted. There is no granulation within the wound bed. There is a large (67-100%) amount of necrotic tissue within the wound bed including Eschar. Wound #5 status is Open. Original cause of wound was Trauma. The wound is located on the Right,Circumferential Toe Third. The wound measures 2cm length x 1.5cm width x 0.1cm depth; 2.356cm^2 area and 0.236cm^3 volume. There is a medium amount of sanguinous drainage noted. There is no granulation within the wound bed. There is a large (67-100%) amount of necrotic tissue within the wound bed including Eschar. Wound #6 status is Open. Original cause of wound was Trauma. The wound is located on the Right,Circumferential Toe Fourth. The wound measures 2.2cm length x 1.5cm width x 0.1cm depth; 2.592cm^2 area and 0.259cm^3 volume. There is a medium amount of sanguinous drainage noted. There is no granulation within the wound bed. There is a large (67-100%) amount of necrotic tissue within the wound bed including Eschar. Wound #7 status is Open. Original cause of wound  was Trauma. The wound is located on the  Right,Circumferential Toe Fifth. The wound measures 1cm length x 1.5cm width x 0.1cm depth; 1.178cm^2 area and 0.118cm^3 volume. There is a medium amount of sanguinous drainage noted. There is no granulation within the wound bed. There is a large (67-100%) amount of necrotic tissue within the wound bed including Eschar and Adherent Slough. 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 Bathing/ Shower/ Hygiene: May shower; gently cleanse wound with antibacterial soap, rinse and pat dry prior to dressing wounds Consults ordered were: Vascular - Refer to Dr. Doren Custard for earlier appointment. WOUND #4: - Toe Second Wound Laterality: Right, Circumferential Topical: Mupirocin Ointment 1 x Per Day/15 Days Discharge Instructions: Apply as directed by provider. Primary Dressing: Gauze 1 x Per Day/15 Days Discharge Instructions: As directed: dry, moistened with saline or moistened with Dakins Solution WOUND #5: - Toe Third Wound Laterality: Right, Circumferential Topical: Mupirocin Ointment 1 x Per DQQ/22 Days Discharge Instructions: Apply as directed by provider. Primary Dressing: Gauze 1 x Per Day/15 Days Discharge Instructions: As directed: dry, moistened with saline or moistened with Dakins Solution WOUND #6: - Toe Fourth Wound Laterality: Right, Circumferential Topical: Mupirocin Ointment 1 x Per LNL/89 Days Discharge Instructions: Apply as directed by provider. Primary Dressing: Gauze 1 x Per Day/15 Days Discharge Instructions: As directed: dry, moistened with saline or moistened with Dakins Solution WOUND #7: - Toe Fifth Wound Laterality: Right, Circumferential Topical: Mupirocin Ointment 1 x  Per QJJ/94 Days Discharge Instructions: Apply as directed by provider. Primary Dressing: Gauze 1 x Per RDE/08 Days Discharge Instructions: As directed: dry, moistened with saline or moistened with [place] ([id number]) 1. 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 He is in a lot of pain with claudication at rest at night and with minimal activity. I am going to reach out to Dr. Doren Custard who did his original surgery to see him more urgently. I see that he had noninvasive studies booked in follow-up on March 07, 2020 however I think he is going to need to be seen earlier than that. 2. For now the topical antibiotics were fine with gauze to separate his toes. 3. At the time of his initial angiogram he only had 1 vessel runoff via the peroneal which gave rise to the posterior tibial at the ankle. I did not see a comment about the anterior tibial 4. I do not expect these wounds to heal unless there is more blood flow and I do not know that he would be a candidate for any foot conserving surgery I spent 35 minutes in review of this patient's past medical history, face-to-face evaluation and preparation of this record Electronic Signature(s) Signed: 02/07/2020 11:56:06 AM By: Linton Ham MD Entered By: Linton Ham on 02/06/2020 12:15:50 Thomas Mcguire ([id number]) -------------------------------------------------------------------------------- ROS/PFSH Details Patient Name: WENDY, MIKLES. Date of Service: 02/06/2020 10:30 AM Medical Record Number: [id number] Patient Account Number: [id number] Date of Birth/Sex: [birth year] (80 y.o. M) Treating RN: Cornell Barman Primary Care Provider: Leanna Battles Other Clinician: Referring Provider: Betsy Coder Treating Provider/Extender: Tito Dine in Treatment: 0 Information Obtained From Patient Integumentary  (Skin) Complaints and Symptoms: Positive  for: Wounds Medical History: Negative for: History of Burn; History of pressure wounds Eyes Medical History: Positive for: Cataracts - removed Negative for: Glaucoma; Optic Neuritis Ear/Nose/Mouth/Throat Medical History: Negative for: Chronic sinus problems/congestion; Middle ear problems Hematologic/Lymphatic Medical History: Positive for: Lymphedema Negative for: Anemia; Hemophilia; Human Immunodeficiency Virus; Sickle Cell Disease Respiratory Medical History: Negative for: Aspiration; Asthma; Chronic Obstructive Pulmonary Disease (COPD); Pneumothorax; Sleep Apnea; Tuberculosis Cardiovascular Medical History: Positive for: Hypertension; Peripheral Arterial Disease Negative for: Angina; Arrhythmia; Congestive Heart Failure; Coronary Artery Disease; Deep Vein Thrombosis; Myocardial Infarction; Peripheral Venous Disease; Phlebitis; Vasculitis Gastrointestinal Medical History: Negative for: Cirrhosis ; Colitis; Crohnos; Hepatitis A; Hepatitis B; Hepatitis C Endocrine Medical History: Positive for: Type II Diabetes - insulin dependant Time with diabetes: over 20 years Treated with: Insulin Blood sugar tested every day: Yes Tested : 4 to 5 times daily Blood sugar testing results: Breakfast: 120; Bedtime: [address] DEMARYIUS, IMRAN ([id number]) Medical History: Positive for: End Stage Renal Disease - Dialysis Tues, Thurs, Sat. Immunological Medical History: Negative for: Lupus Erythematosus; Raynaudos; Scleroderma Musculoskeletal Medical History: Positive for: Gout Negative for: Rheumatoid Arthritis; Osteoarthritis; Osteomyelitis Neurologic Medical History: Negative for: Dementia; Neuropathy; Quadriplegia; Paraplegia; Seizure Disorder Oncologic Medical History: Negative for: Received Chemotherapy; Received Radiation Psychiatric Medical History: Negative for: Anorexia/bulimia; Confinement Anxiety HBO Extended History  Items Eyes: Cataracts Immunizations Pneumococcal Vaccine: Received Pneumococcal Vaccination: Yes Implantable Devices None Hospitalization / Surgery History Type of Hospitalization/Surgery shut placement at [facility] 3 weeks ago Family and Social History Cancer: No; Diabetes: Yes - Mother,Father; Heart Disease: No; Hypertension: Yes - Mother; Kidney Disease: No; Lung Disease: No; Seizures: No; Stroke: No; Thyroid Problems: No; Tuberculosis: No; Former smoker - quit 24 years ago; Marital Status - Married; Alcohol Use: Never; Drug Use: No History; Caffeine Use: Daily; Financial Concerns: No; Food, Clothing or Shelter Needs: No; Support System Lacking: No; Transportation Concerns: No Engineer, maintenance) Signed: 02/06/2020 5:40:58 PM By: Gretta Cool, BSN, RN, CWS, Kim RN, BSN Signed: 02/07/2020 11:56:06 AM By: Linton Ham MD Entered By: Gretta Cool, BSN, RN, CWS, Kim on 02/06/2020 11:24:33 AMRON, GUERRETTE ([id number]) -------------------------------------------------------------------------------- [place] Details Patient Name: BERL, BONFANTI. Date of Service: 02/06/2020 Medical Record Number: [id number] Patient Account Number: [id number] Date of Birth/Sex: 05-05-1940 (80 y.o. 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 of native arteries of extremities with intermittent claudication, right leg Facility Procedures CPT4 Code: 62952841 Description: 99213 - WOUND CARE VISIT-LEV 3 EST PT Modifier: Quantity: 1 Physician Procedures CPT4 Code: 3244010 Description: 99214 - WC PHYS LEVEL 4 - EST  PT Modifier: Quantity: 1 CPT4 Code: Description: ICD-10 Diagnosis Description 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

## 2020-02-07 NOTE — Progress Notes (Addendum)
Thomas, Mcguire ([id number]) Visit Report for 02/06/2020 Allergy List Details Patient Name: Thomas Mcguire, Thomas Mcguire. Date of Service: 02/06/2020 10:30 AM Medical Record Number: [id number] Patient Account Number: [id number] Date of Birth/Sex: [birth year] (79 y.o. M) Treating RN: Cornell Barman Primary Care Anyelina Claycomb: Leanna Battles Other Clinician: Referring Nik Gorrell: Betsy Coder Treating Veto Macqueen/Extender: Tito Dine in Treatment: 0 Allergies Active Allergies No Known Drug Allergies Type: Allergen Allergy Notes Electronic Signature(s) Signed: 02/06/2020 5:40:58 PM By: Gretta Cool, BSN, RN, CWS, Kim RN, BSN Entered By: Gretta Cool, BSN, RN, CWS, Kim on 02/06/2020 11:22:14 [name] ([id number]) -------------------------------------------------------------------------------- Arrival Information Details Patient Name: Thomas, Mcguire. Date of Service: 02/06/2020 10:30 AM Medical Record Number: [id number] Patient Account Number: [id number] Date of Birth/Sex: Jul 26, 1940 (79 y.o. M) Treating RN: Cornell Barman Primary Care Jaedin Trumbo: Leanna Battles Other Clinician: Referring Deantre Bourdon: Betsy Coder Treating Lavora Brisbon/Extender: Tito Dine in Treatment: 0 Visit Information Patient Arrived: Wheel Chair Arrival Time: 10:55 Accompanied By: spouse Transfer Assistance: None Patient Identification Verified: Yes Secondary Verification Process Completed: Yes History Since Last Visit Electronic Signature(s) Signed: 02/06/2020 5:40:58 PM By: Gretta Cool, BSN, RN, CWS, Kim RN, BSN Entered By: Gretta Cool, BSN, RN, CWS, Kim on 02/06/2020 10:58:25 [name] ([id number]) -------------------------------------------------------------------------------- Clinic Level of Care Assessment Details Patient Name: [name], [name]. Date of Service: 02/06/2020 10:30 AM Medical Record Number: [id number] Patient Account Number: [id number] Date of Birth/Sex: 08-31-1940 (79 y.o. M) Treating RN:  Cornell Barman Primary Care Sophia Sperry: Leanna Battles Other Clinician: Referring Darcel Zick: Betsy Coder Treating Houston Surges/Extender: Tito Dine in Treatment: 0 Clinic Level of Care Assessment Items TOOL 1 Quantity Score []  - Use when EandM and Procedure is performed on INITIAL visit 0 ASSESSMENTS - Nursing Assessment / Reassessment X - General Physical Exam (combine w/ comprehensive assessment (listed just below) when performed on new 1 20 pt. evals) X- 1 25 Comprehensive Assessment (HX, ROS, Risk Assessments, Wounds Hx, etc.) ASSESSMENTS - Wound and Skin Assessment / Reassessment []  - Dermatologic / Skin Assessment (not related to wound area) 0 ASSESSMENTS - Ostomy and/or Continence Assessment and Care []  - Incontinence Assessment and Management 0 []  - 0 Ostomy Care Assessment and Management (repouching, etc.) PROCESS - Coordination of Care X - Simple Patient / Family Education for ongoing care 1 15 []  - 0 Complex (extensive) Patient / Family Education for ongoing care X- 1 10 Staff obtains Consents, Records, Test Results / Process Orders []  - 0 Staff telephones HHA, Nursing Homes / Clarify orders / etc []  - 0 Routine Transfer to another Facility (non-emergent condition) []  - 0 Routine Hospital Admission (non-emergent condition) X- 1 15 New Admissions / Biomedical engineer / Ordering NPWT, Apligraf, etc. []  - 0 Emergency Hospital Admission (emergent condition) PROCESS - Special Needs []  - Pediatric / Minor Patient Management 0 []  - 0 Isolation Patient Management []  - 0 Hearing / Language / Visual special needs []  - 0 Assessment of Community assistance (transportation, D/C planning, etc.) []  - 0 Additional assistance / Altered mentation []  - 0 Support Surface(s) Assessment (bed, cushion, seat, etc.) INTERVENTIONS - Miscellaneous []  - External ear exam 0 []  - 0 Patient Transfer (multiple staff / Civil Service fast streamer / Similar devices) []  - 0 Simple Staple /  Suture removal (25 or less) []  - 0 Complex Staple / Suture removal (26 or more) []  - 0 Hypo/Hyperglycemic Management (do not check if billed separately) []  - 0 Ankle / Brachial Index (ABI) - do not check if billed separately Has the patient been seen  at the hospital within the last three years: Yes Total Score: 85 Level Of Care: New/Established - [address] KHANG, HANNUM ([id number]) Electronic Signature(s) Signed: 02/06/2020 5:40:58 PM By: Gretta Cool, BSN, RN, CWS, Kim RN, BSN Entered By: Gretta Cool, BSN, RN, CWS, Kim on 02/06/2020 11:40:40 [name] ([id number]) -------------------------------------------------------------------------------- Encounter Discharge Information Details Patient Name: Thomas, Mcguire. Date of Service: 02/06/2020 10:30 AM Medical Record Number: [id number] Patient Account Number: [id number] Date of Birth/Sex: 1940/12/15 (79 y.o. M) Treating RN: Cornell Barman Primary Care Pasqual Farias: Leanna Battles Other Clinician: Referring Abygayle Deltoro: Betsy Coder Treating Donelle Hise/Extender: Tito Dine in Treatment: 0 Encounter Discharge Information Items Discharge Condition: Stable Ambulatory Status: Wheelchair Discharge Destination: Home Transportation: Private Auto Accompanied By: wife and son Schedule Follow-up Appointment: Yes Clinical Summary of Care: Electronic Signature(s) Signed: 02/06/2020 11:53:34 AM By: Gretta Cool, BSN, RN, CWS, Kim RN, BSN Entered By: Gretta Cool, BSN, RN, CWS, Kim on 02/06/2020 11:53:33 [name] ([id number]) -------------------------------------------------------------------------------- Lower Extremity Assessment Details Patient Name: Thomas, Mcguire. Date of Service: 02/06/2020 10:30 AM Medical Record Number: [id number] Patient Account Number: [id number] Date of Birth/Sex: April 28, 1940 (79 y.o. M) Treating RN: Cornell Barman Primary Care Tesean Stump: Leanna Battles Other Clinician: Referring Fareeha Evon: Betsy Coder Treating  Devantae Babe/Extender: Tito Dine in Treatment: 0 Edema Assessment Assessed: [Left: No] [Right: Yes] [Left: Edema] [Right: :] Calf Left: Right: Point of Measurement: 34 cm From Medial Instep 35.2 cm Ankle Left: Right: Point of Measurement: 12 cm From Medial Instep 27.5 cm Knee To Floor Left: Right: From Medial Instep 51 cm Vascular Assessment Pulses: Dorsalis Pedis Palpable: [Right:No] Doppler Audible: [Right:Inaudible] Posterior Tibial Palpable: [Right:No Yes] Notes 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 Electronic Signature(s) Signed: 02/06/2020 5:40:58 PM By: Gretta Cool, BSN, RN, CWS, Kim RN, BSN Entered By: Gretta Cool, BSN, RN, CWS, Kim on 02/06/2020 11:22:08 KULLEN, TOMASETTI ([id number]) -------------------------------------------------------------------------------- Multi Wound Chart Details Patient Name: AVANEESH, PEPITONE. Date of Service: 02/06/2020 10:30 AM Medical Record Number: [id number] Patient Account Number: [id number] Date of Birth/Sex: 11-18-1940 (79 y.o. 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 Extremity Extremity Date Acquired: 01/07/2020 01/07/2020 01/07/2020 Weeks of Treatment: 0 0 0 Wound Status: Open Open  Open Pending Amputation on Yes Yes Yes Presentation: Measurements L x W x D (cm) 1.6x6.7x0.1 2x1.5x0.1 2.2x1.5x0.1 Area (cm) : 8.419 2.356 2.592 Volume (cm) : 0.842 0.236 0.259 % Reduction in Area: 0.00% 0.00% 0.00% % Reduction in Volume: 0.00% 0.00% 0.00% Classification: Unclassifiable Unclassifiable Unclassifiable Exudate Amount: Medium Medium Medium Exudate Type: Sanguinous Sanguinous Sanguinous Exudate Color: red red red Granulation Amount: None Present (0%) None Present (0%) None Present (0%) Necrotic Amount: Large (67-100%) Large (67-100%) Large (67-100%) Necrotic Tissue: Eschar Eschar Eschar Exposed Structures: Fascia: No Fascia: No N/A Fat Layer (Subcutaneous Tissue): Fat Layer (Subcutaneous Tissue): No No Tendon: No Tendon: No Muscle: No Muscle: No Joint: No Joint: No Bone: No Bone: No Wound Number: 7 N/A N/A Photos: N/A N/A Wound Location: Right, Circumferential Toe Fifth  N/A N/A IRVING, BLOOR ([id number]) Wounding Event: Trauma N/A N/A Primary Etiology: Arterial Insufficiency Ulcer N/A N/A Secondary Etiology: Diabetic Wound/Ulcer of the Lower N/A N/A Extremity Date Acquired: 01/07/2020 N/A N/A Weeks of Treatment: 0 N/A N/A Wound Status: Open N/A N/A Pending Amputation on Yes N/A N/A Presentation: Measurements L x W x D (cm) 1x1.5x0.1 N/A N/A Area (cm) : 1.178 N/A N/A Volume (cm) : 0.118 N/A N/A % Reduction in Area: 0.00% N/A N/A % Reduction in Volume: 0.00% N/A N/A Classification: Unclassifiable N/A N/A Exudate Amount: Medium N/A N/A Exudate Type: Sanguinous N/A N/A Exudate Color: red N/A N/A Granulation Amount: None Present (0%) N/A N/A Necrotic Amount: Large (67-100%) N/A N/A Necrotic Tissue: Eschar, Adherent Slough N/A N/A Exposed Structures: N/A N/A N/A Treatment Notes Wound #4 (Toe Second) Wound Laterality: Right, Circumferential Cleanser Peri-Wound Care Topical Mupirocin Ointment Discharge Instruction: Apply as directed by  Obera Stauch. Primary Dressing Gauze Discharge Instruction: As directed: dry, moistened with saline or moistened with Dakins Solution Secondary Dressing Secured With Compression Wrap Compression Stockings Add-Ons Wound #5 (Toe Third) Wound Laterality: Right, Circumferential Cleanser Peri-Wound Care Topical Mupirocin Ointment Discharge Instruction: Apply as directed by Jarae Panas. Primary Dressing Gauze Discharge Instruction: As directed: dry, moistened with saline or moistened with Dakins Solution Secondary Dressing Secured With Compression Wrap Compression Stockings SAMAN, UMSTEAD ([id number]) Add-Ons Wound #6 (Toe Fourth) Wound Laterality: Right, Circumferential Cleanser Peri-Wound Care Topical Mupirocin Ointment Discharge Instruction: Apply as directed by Linea Calles. Primary Dressing Gauze Discharge Instruction: As directed: dry, moistened with saline or moistened with Dakins Solution Secondary Dressing Secured With Compression Wrap Compression Stockings Add-Ons Wound #7 (Toe Fifth) Wound Laterality: Right, Circumferential Cleanser Peri-Wound Care Topical Mupirocin Ointment Discharge Instruction: Apply as directed by Caitlyne Ingham. Primary Dressing Gauze Discharge Instruction: As directed: dry, moistened with saline or moistened with Dakins Solution Secondary Dressing Secured With Compression Wrap Compression Stockings Add-Ons Electronic Signature(s) Signed: 02/07/2020 11:56:06 AM By: Linton Ham MD Entered By: Linton Ham on 02/06/2020 11:53:03 [name] ([id number]) -------------------------------------------------------------------------------- [place] Details Patient Name: RAYYAN, BURLEY. Date of Service: 02/06/2020 10:30 AM Medical Record Number: [id number] Patient Account Number: [id number] Date of Birth/Sex: Sep 29, 1940 (79 y.o. M) Treating RN: Cornell Barman Primary Care Jazalynn Mireles: Leanna Battles Other  Clinician: Referring Shyquan Stallbaumer: Betsy Coder Treating Ricard Faulkner/Extender: Tito Dine in Treatment: 0 Active Inactive Electronic Signature(s) Signed: 02/26/2020 8:27:14 AM By: Gretta Cool, BSN, RN, CWS, Kim RN, BSN Previous Signature: 02/06/2020 5:40:58 PM Version By: Gretta Cool, BSN, RN, CWS, Kim RN, BSN Entered By: Gretta Cool, BSN, RN, CWS, Kim on 02/26/2020 08:27:13 CHAYSE, ZATARAIN ([id number]) -------------------------------------------------------------------------------- Pain Assessment Details Patient Name: TAYLEN, OSORTO. Date of Service: 02/06/2020 10:30 AM Medical Record Number: [id number] Patient Account Number: [id number] Date of Birth/Sex: 02-03-1940 (79 y.o. M) Treating RN: Cornell Barman Primary Care Renso Swett: Leanna Battles Other Clinician: Referring Taj Nevins: Betsy Coder Treating Luanne Krzyzanowski/Extender: Tito Dine in Treatment: 0 Active Problems Location of Pain Severity and Description of Pain Patient Has Paino Yes Site Locations Pain Location: Pain in Ulcers Duration of the Pain. Constant / Intermittento Intermittent Rate the pain. Current Pain Level: 6 Pain Management and Medication Current Pain Management: Notes Pain keeps patient awake at night. Electronic Signature(s) Signed: 02/06/2020 5:40:58 PM By: Gretta Cool, BSN, RN, CWS, Kim RN, BSN Entered By: Gretta Cool, BSN, RN, CWS, Kim on 02/06/2020 10:59:14 [name] ([id number]) -------------------------------------------------------------------------------- Patient/Caregiver Education Details Patient Name: DANNIS, DEROCHE. Date of Service: 02/06/2020 10:30 AM Medical Record Number: [id number] Patient Account Number: [id number] Date of  Birth/Gender: [birth year] (80 y.o. M) Treating RN: Cornell Barman Primary Care Physician: Leanna Battles Other Clinician: Referring Physician: Betsy Coder Treating Physician/Extender: Tito Dine in Treatment: 0 Education Assessment Education  Provided To: Patient Education Topics Provided Tissue Oxygenation: Welcome To [facility]: Wound/Skin Impairment: Handouts: Caring for Your Ulcer Methods: Demonstration, Explain/Verbal Responses: State content correctly Electronic Signature(s) Signed: 02/06/2020 5:40:58 PM By: Gretta Cool, BSN, RN, CWS, Kim RN, BSN Entered By: Gretta Cool, BSN, RN, CWS, Kim on 02/06/2020 11:41:13 [name] ([id number]) -------------------------------------------------------------------------------- Wound Assessment Details Patient Name: AUNDRAY, CARTLIDGE. Date of Service: 02/06/2020 10:30 AM Medical Record Number: [id number] Patient Account Number: [id number] Date of Birth/Sex: 01/11/1941 (79 y.o. M) Treating RN: Cornell Barman Primary Care Lyle Niblett: Leanna Battles Other Clinician: Referring Jaylea Plourde: Betsy Coder Treating Shalice Woodring/Extender: Tito Dine in Treatment: 0 Wound Status Wound Number: 4 Primary Etiology: Arterial Insufficiency Ulcer Wound Location: Right, Circumferential Toe Second Wound Status: Open Wounding Event: Trauma Date Acquired: 01/07/2020 Weeks Of Treatment: 0 Clustered Wound: No Pending Amputation On Presentation Photos Wound Measurements Length: (cm) 1.6 Width: (cm) 6.7 Depth: (cm) 0.1 Area: (cm) 8.419 Volume: (cm) 0.842 % Reduction in Area: 0% % Reduction in Volume: 0% Tunneling: No Undermining: No Wound Description Classification: Unclassifiable Exudate Amount: Medium Exudate Type: Sanguinous Exudate Color: red Foul Odor After Cleansing: No Slough/Fibrino No Wound Bed Granulation Amount: None Present (0%) Exposed Structure Necrotic Amount: Large (67-100%) Fascia Exposed: No Necrotic Quality: Eschar Fat Layer (Subcutaneous Tissue) Exposed: No Tendon Exposed: No Muscle Exposed: No Joint Exposed: No Bone Exposed: No Electronic Signature(s) Signed: 02/06/2020 5:40:58 PM By: Gretta Cool, BSN, RN, CWS, Kim RN, BSN Entered By: Gretta Cool, BSN,  RN, CWS, Kim on 02/06/2020 11:12:52 [name] ([id number]) -------------------------------------------------------------------------------- Wound Assessment Details Patient Name: MAICOL, BOWLAND. Date of Service: 02/06/2020 10:30 AM Medical Record Number: [id number] Patient Account Number: [id number] Date of Birth/Sex: Feb 22, 1940 (79 y.o. M) Treating RN: Cornell Barman Primary Care Shauntee Karp: Leanna Battles Other Clinician: Referring Areatha Kalata: Betsy Coder Treating Kaylon Laroche/Extender: Tito Dine in Treatment: 0 Wound Status Wound Number: 5 Primary Etiology: Arterial Insufficiency Ulcer Wound Location: Right, Circumferential Toe Third Secondary Etiology: Diabetic Wound/Ulcer of the Lower Extremity Wounding Event: Trauma Wound Status: Open Date Acquired: 01/07/2020 Weeks Of Treatment: 0 Clustered Wound: No Pending Amputation On Presentation Photos Wound Measurements Length: (cm) 2 Width: (cm) 1.5 Depth: (cm) 0.1 Area: (cm) 2.356 Volume: (cm) 0.236 % Reduction in Area: 0% % Reduction in Volume: 0% Wound Description Classification: Unclassifiable Exudate Amount: Medium Exudate Type: Sanguinous Exudate Color: red Wound Bed Granulation Amount: None Present (0%) Exposed Structure Necrotic Amount: Large (67-100%) Fascia Exposed: No Necrotic Quality: Eschar Fat Layer (Subcutaneous Tissue) Exposed: No Tendon Exposed: No Muscle Exposed: No Joint Exposed: No Bone Exposed: No Electronic Signature(s) Signed: 02/06/2020 5:40:58 PM By: Gretta Cool, BSN, RN, CWS, Kim RN, BSN Entered By: Gretta Cool, BSN, RN, CWS, Kim on 02/06/2020 11:13:41 [name] ([id number]) -------------------------------------------------------------------------------- Wound Assessment Details Patient Name: ROXAS, CLYMER. Date of Service: 02/06/2020 10:30 AM Medical Record Number: [id number] Patient Account Number: [id number] Date of Birth/Sex: 13-May-1940 (79 y.o. M) Treating RN:  Cornell Barman Primary Care Gurtaj Ruz: Leanna Battles Other Clinician: Referring Pranavi Aure: Betsy Coder Treating Charleen Madera/Extender: Tito Dine in Treatment: 0 Wound Status Wound Number: 6 Primary Etiology: Arterial Insufficiency Ulcer Wound Location: Right, Circumferential Toe Fourth Secondary Etiology: Diabetic Wound/Ulcer of the Lower Extremity Wounding Event: Trauma Wound Status: Open Date Acquired: 01/07/2020 Weeks Of Treatment: 0 Clustered Wound: No Pending Amputation On Presentation  Photos Wound Measurements Length: (cm) 2.2 Width: (cm) 1.5 Depth: (cm) 0.1 Area: (cm) 2.592 Volume: (cm) 0.259 % Reduction in Area: 0% % Reduction in Volume: 0% Wound Description Classification: Unclassifiable Exudate Amount: Medium Exudate Type: Sanguinous Exudate Color: red Wound Bed Granulation Amount: None Present (0%) Necrotic Amount: Large (67-100%) Necrotic Quality: Estate manager/land agent Signature(s) Signed: 02/06/2020 5:40:58 PM By: Gretta Cool, BSN, RN, CWS, Kim RN, BSN Entered By: Gretta Cool, BSN, RN, CWS, Kim on 02/06/2020 11:14:22 [name] ([id number]) -------------------------------------------------------------------------------- Wound Assessment Details Patient Name: BISHOP, VANDERWERF. Date of Service: 02/06/2020 10:30 AM Medical Record Number: [id number] Patient Account Number: [id number] Date of Birth/Sex: 1940-08-01 (79 y.o. M) Treating RN: Cornell Barman Primary Care Ghalia Reicks: Leanna Battles Other Clinician: Referring Seona Clemenson: Betsy Coder Treating Shameria Trimarco/Extender: Tito Dine in Treatment: 0 Wound Status Wound Number: 7 Primary Etiology: Arterial Insufficiency Ulcer Wound Location: Right, Circumferential Toe Fifth Secondary Etiology: Diabetic Wound/Ulcer of the Lower Extremity Wounding Event: Trauma Wound Status: Open Date Acquired: 01/07/2020 Weeks Of Treatment: 0 Clustered Wound: No Pending Amputation On Presentation Photos Wound  Measurements Length: (cm) 1 Width: (cm) 1.5 Depth: (cm) 0.1 Area: (cm) 1.178 Volume: (cm) 0.118 % Reduction in Area: 0% % Reduction in Volume: 0% Wound Description Classification: Unclassifiable Exudate Amount: Medium Exudate Type: Sanguinous Exudate Color: red Wound Bed Granulation Amount: None Present (0%) Necrotic Amount: Large (67-100%) Necrotic Quality: Eschar, Adherent Therapist, music) Signed: 02/06/2020 5:40:58 PM By: Gretta Cool, BSN, RN, CWS, Kim RN, BSN Entered By: Gretta Cool, BSN, RN, CWS, Kim on 02/06/2020 11:15:04 [name] ([id number]) -------------------------------------------------------------------------------- Vitals Details Patient Name: [name]. Date of Service: 02/06/2020 10:30 AM Medical Record Number: [id number] Patient Account Number: [id number] Date of Birth/Sex: 1940-12-27 (79 y.o. M) Treating RN: Cornell Barman Primary Care Lafern Brinkley: Leanna Battles Other Clinician: Referring Ravenna Legore: Betsy Coder Treating Kartel Wolbert/Extender: Tito Dine in Treatment: 0 Vital Signs Time Taken: 10:59 Temperature (F): 98.2 Height (in): 78 Pulse (bpm): 102 Weight (lbs): 196 Respiratory Rate (breaths/min): 18 Body Mass Index (BMI): 22.6 Blood Pressure (mmHg): 122/65 Reference Range: 80 - 120 mg / dl Electronic Signature(s) Signed: 02/06/2020 5:40:58 PM By: Gretta Cool, BSN, RN, CWS, Kim RN, BSN Entered By: Gretta Cool, BSN, RN, CWS, Kim on 02/06/2020 10:59:53

## 2020-02-10 ENCOUNTER — Encounter (HOSPITAL_COMMUNITY): Payer: Self-pay | Admitting: Emergency Medicine

## 2020-02-10 ENCOUNTER — Inpatient Hospital Stay (HOSPITAL_COMMUNITY)
Admission: EM | Admit: 2020-02-10 | Discharge: 2020-02-20 | DRG: 239 | Disposition: A | Discharge status: Skilled Nursing Facility | Payer: Medicare Other | Attending: Internal Medicine | Admitting: Internal Medicine

## 2020-02-10 ENCOUNTER — Other Ambulatory Visit: Payer: Self-pay

## 2020-02-10 ENCOUNTER — Emergency Department (HOSPITAL_COMMUNITY): Payer: Medicare Other

## 2020-02-10 DIAGNOSIS — N186 End stage renal disease: Secondary | ICD-10-CM

## 2020-02-10 DIAGNOSIS — N189 Chronic kidney disease, unspecified: Secondary | ICD-10-CM | POA: Diagnosis present

## 2020-02-10 DIAGNOSIS — E785 Hyperlipidemia, unspecified: Secondary | ICD-10-CM | POA: Diagnosis present

## 2020-02-10 DIAGNOSIS — L03031 Cellulitis of right toe: Secondary | ICD-10-CM | POA: Diagnosis present

## 2020-02-10 DIAGNOSIS — Z20822 Contact with and (suspected) exposure to covid-19: Secondary | ICD-10-CM | POA: Diagnosis present

## 2020-02-10 DIAGNOSIS — D631 Anemia in chronic kidney disease: Secondary | ICD-10-CM | POA: Diagnosis present

## 2020-02-10 DIAGNOSIS — M109 Gout, unspecified: Secondary | ICD-10-CM | POA: Diagnosis present

## 2020-02-10 DIAGNOSIS — L98499 Non-pressure chronic ulcer of skin of other sites with unspecified severity: Secondary | ICD-10-CM | POA: Diagnosis present

## 2020-02-10 DIAGNOSIS — I739 Peripheral vascular disease, unspecified: Secondary | ICD-10-CM

## 2020-02-10 DIAGNOSIS — Z87891 Personal history of nicotine dependence: Secondary | ICD-10-CM

## 2020-02-10 DIAGNOSIS — Z992 Dependence on renal dialysis: Secondary | ICD-10-CM | POA: Diagnosis not present

## 2020-02-10 DIAGNOSIS — C9110 Chronic lymphocytic leukemia of B-cell type not having achieved remission: Secondary | ICD-10-CM | POA: Diagnosis present

## 2020-02-10 DIAGNOSIS — M1990 Unspecified osteoarthritis, unspecified site: Secondary | ICD-10-CM | POA: Diagnosis present

## 2020-02-10 DIAGNOSIS — I96 Gangrene, not elsewhere classified: Secondary | ICD-10-CM

## 2020-02-10 DIAGNOSIS — L03115 Cellulitis of right lower limb: Secondary | ICD-10-CM | POA: Diagnosis present

## 2020-02-10 DIAGNOSIS — I70261 Atherosclerosis of native arteries of extremities with gangrene, right leg: Secondary | ICD-10-CM | POA: Diagnosis present

## 2020-02-10 DIAGNOSIS — E1122 Type 2 diabetes mellitus with diabetic chronic kidney disease: Secondary | ICD-10-CM | POA: Diagnosis present

## 2020-02-10 DIAGNOSIS — L02611 Cutaneous abscess of right foot: Secondary | ICD-10-CM | POA: Diagnosis present

## 2020-02-10 DIAGNOSIS — Z01812 Encounter for preprocedural laboratory examination: Secondary | ICD-10-CM

## 2020-02-10 DIAGNOSIS — D696 Thrombocytopenia, unspecified: Secondary | ICD-10-CM | POA: Diagnosis present

## 2020-02-10 DIAGNOSIS — M898X9 Other specified disorders of bone, unspecified site: Secondary | ICD-10-CM | POA: Diagnosis present

## 2020-02-10 DIAGNOSIS — I9589 Other hypotension: Secondary | ICD-10-CM | POA: Diagnosis present

## 2020-02-10 DIAGNOSIS — I70268 Atherosclerosis of native arteries of extremities with gangrene, other extremity: Secondary | ICD-10-CM | POA: Diagnosis present

## 2020-02-10 DIAGNOSIS — Z794 Long term (current) use of insulin: Secondary | ICD-10-CM

## 2020-02-10 DIAGNOSIS — E1152 Type 2 diabetes mellitus with diabetic peripheral angiopathy with gangrene: Principal | ICD-10-CM | POA: Diagnosis present

## 2020-02-10 DIAGNOSIS — I120 Hypertensive chronic kidney disease with stage 5 chronic kidney disease or end stage renal disease: Secondary | ICD-10-CM | POA: Diagnosis present

## 2020-02-10 DIAGNOSIS — Z79899 Other long term (current) drug therapy: Secondary | ICD-10-CM

## 2020-02-10 DIAGNOSIS — N2581 Secondary hyperparathyroidism of renal origin: Secondary | ICD-10-CM | POA: Diagnosis present

## 2020-02-10 DIAGNOSIS — E119 Type 2 diabetes mellitus without complications: Secondary | ICD-10-CM

## 2020-02-10 LAB — SARS CORONAVIRUS 2 BY RT PCR (HOSPITAL ORDER, PERFORMED IN ~~LOC~~ HOSPITAL LAB): SARS Coronavirus 2: NEGATIVE

## 2020-02-10 LAB — BASIC METABOLIC PANEL
Anion gap: 13 (ref 5–15)
BUN: 45 mg/dL — ABNORMAL HIGH (ref 8–23)
CO2: 32 mmol/L (ref 22–32)
Calcium: 9.3 mg/dL (ref 8.9–10.3)
Chloride: 94 mmol/L — ABNORMAL LOW (ref 98–111)
Creatinine, Ser: 5.13 mg/dL — ABNORMAL HIGH (ref 0.61–1.24)
GFR, Estimated: 11 mL/min — ABNORMAL LOW (ref >60)
Glucose, Bld: 171 mg/dL — ABNORMAL HIGH (ref 70–99)
Potassium: 3.8 mmol/L (ref 3.5–5.1)
Sodium: 139 mmol/L (ref 135–145)

## 2020-02-10 LAB — CBC WITH DIFFERENTIAL/PLATELET
Abs Immature Granulocytes: 0.06 10*3/uL (ref 0.00–0.07)
Basophils Absolute: 0 10*3/uL (ref 0.0–0.1)
Basophils Relative: 0 %
Eosinophils Absolute: 0 10*3/uL (ref 0.0–0.5)
Eosinophils Relative: 0 %
HCT: 40.9 % (ref 39.0–52.0)
Hemoglobin: 12 g/dL — ABNORMAL LOW (ref 13.0–17.0)
Immature Granulocytes: 0 %
Lymphocytes Relative: 59 %
Lymphs Abs: 8 10*3/uL — ABNORMAL HIGH (ref 0.7–4.0)
MCH: 26.4 pg (ref 26.0–34.0)
MCHC: 29.3 g/dL — ABNORMAL LOW (ref 30.0–36.0)
MCV: 90.1 fL (ref 80.0–100.0)
Monocytes Absolute: 1.2 10*3/uL — ABNORMAL HIGH (ref 0.1–1.0)
Monocytes Relative: 8 %
Neutro Abs: 4.5 10*3/uL (ref 1.7–7.7)
Neutrophils Relative %: 33 %
Platelets: 138 10*3/uL — ABNORMAL LOW (ref 150–400)
RBC: 4.54 MIL/uL (ref 4.22–5.81)
RDW: 19.1 % — ABNORMAL HIGH (ref 11.5–15.5)
WBC: 13.8 10*3/uL — ABNORMAL HIGH (ref 4.0–10.5)
nRBC: 0 % (ref 0.0–0.2)

## 2020-02-10 MED ORDER — SODIUM CHLORIDE 0.9 % IV SOLN
1.0000 g | Freq: Every day | INTRAVENOUS | Status: AC
  Administered 2020-02-11 – 2020-02-16 (×5): 1 g via INTRAVENOUS
  Filled 2020-02-10 (×9): qty 1

## 2020-02-10 MED ORDER — PIPERACILLIN-TAZOBACTAM 3.375 G IVPB 30 MIN
3.3750 g | INTRAVENOUS | Status: AC
  Administered 2020-02-10: 3.375 g via INTRAVENOUS
  Filled 2020-02-10: qty 50

## 2020-02-10 MED ORDER — VANCOMYCIN HCL IN DEXTROSE 1-5 GM/200ML-% IV SOLN
1000.0000 mg | INTRAVENOUS | Status: DC
  Filled 2020-02-10: qty 200

## 2020-02-10 MED ORDER — VANCOMYCIN HCL 2000 MG/400ML IV SOLN
2000.0000 mg | INTRAVENOUS | Status: AC
  Administered 2020-02-10: 2000 mg via INTRAVENOUS
  Filled 2020-02-10: qty 400

## 2020-02-10 MED ORDER — SODIUM CHLORIDE 0.9 % IV SOLN: INTRAVENOUS | Status: DC

## 2020-02-10 NOTE — H&P (Signed)
History and Physical   Thomas Mcguire HRC:[id number] DOB: November 05, 1940 DOA: 02/10/2020  Referring MD/NP/PA: Dr. Gilford Raid  PCP: Leanna Battles, MD   Outpatient Specialists: Dr. Sharol Given, and Dr. Scot Dock, vascular surgery  Patient coming from: Home  Chief Complaint: Right foot blackness  HPI: Thomas Mcguire is a 80 y.o. male with medical history significant of CLL, peripheral vascular disease status post femoropopliteal bypass surgery in 2020, chronic ischemic ulcers of the foot with amputation of the right big toe previously, end-stage renal disease on hemodialysis, diabetes, hyperlipidemia and hypertension who presents to the ER with significant blackness redness and swelling of the right foot.  Patient was noted to have gangrene of the second third and fourth toes.  Also worsening erythema and swelling of the foot.  Appears to have cellulitis around it.  His blood sugar has been fine but patient appears to be is significantly requiring further surgical intervention.  He did not go to his dialysis today and so is being admitted to the hospital for evaluation and treatment..  ED Course: Temperature 97.7 blood pressure 120/61 pulse 101 respirate 25 and oxygen sats 92% on room air.  Sodium is 139 potassium 3.8 chloride 94 CO2 32 glucose 171 creatinine 5.13.  White count 13.8 and platelets 138.  COVID-19 is negative.  X-ray of the foot shows no evidence of osteomyelitis but large linear metallic foreign body in the plantar soft tissue of the foot around the mid first metatarsal.  There is soft tissue swelling about the forefoot.  Patient will be admitted initiated on antibiotics and follow-up with orthopedics.  Review of Systems: As per HPI otherwise 10 point review of systems negative.    Past Medical History:  Diagnosis Date  . Anemia    low iron  . Arthritis   . Chronic renal insufficiency    CHECKED Q3MOS PER PT. Stage 4 as of 05/22/2018 per pt.  . Diabetes mellitus    type 2  . ESRD  (end stage renal disease) ([place])   . Gout    Has not had recently- 08/24/11  . Hyperlipidemia   . Hypertension   . 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 PAD (peripheral artery disease) ([place])   . Wears dentures     Past Surgical History:  Procedure Laterality Date  . A/V FISTULAGRAM N/A 08/11/2018   Procedure: A/V FISTULAGRAM - Right Arm;  Surgeon: Angelia Mould, MD;  Location: [place] [facility];  Service: Cardiovascular;  Laterality: N/A;  . A/V FISTULAGRAM N/A 09/07/2019   Procedure: A/V FISTULAGRAM - Right AVF;  Surgeon: Angelia Mould, MD;  Location: [place] [facility];  Service: Cardiovascular;  Laterality: N/A;  . ABDOMINAL AORTOGRAM W/LOWER EXTREMITY Right 07/03/2018   Procedure: ABDOMINAL AORTOGRAM W/LOWER EXTREMITY;  Surgeon: Waynetta Sandy, MD;  Location: [place] [facility];  Service: Cardiovascular;  Laterality: Right;  . AMPUTATION  08/27/2011   Procedure: AMPUTATION DIGIT;  Surgeon: Newt Minion, MD;  Location: [facility];  Service: Orthopedics;  Laterality: Left;  Left Foot 3rd toe Amputation MTP joint  . AMPUTATION Right 07/11/2018   Procedure: AMPUTATION RIGHT GREAT TOE;  Surgeon: Angelia Mould, MD;  Location: [facility];  Service: Vascular;  Laterality: Right;  . amputation great toe  2009   left  . AV FISTULA PLACEMENT Right 05/23/2018   Procedure: RADIAL- CEPHALIC ARTERIOVENOUS (AV) FISTULA CREATION RIGHT ARM AND LIGATION OF COMPETING BRANCH.;  Surgeon: Angelia Mould, MD;  [facility];  Service: Vascular;  Laterality: Right;  . BASCILIC VEIN TRANSPOSITION Right 10/01/2019   Procedure: FIRST STAGE BASCILIC VEIN TRANSPOSITION;  Surgeon: Angelia Mould, MD;  Location: [place] [place];  Service: Vascular;  Laterality: Right;  . BASCILIC VEIN TRANSPOSITION Right 11/13/2019   Procedure: RIGHT SECOND STAGE BASCILIC VEIN TRANSPOSITION;  Surgeon: Rosetta Posner, MD;  Location: [facility];  Service: Vascular;  Laterality: Right;  .  COLONOSCOPY    . cyst elbow  1999   right  . FEMORAL-POPLITEAL BYPASS GRAFT Right 07/11/2018   Procedure: BYPASS GRAFT FEMORAL-POPLITEAL ARTERY;  Surgeon: Angelia Mould, MD;  Location: [facility];  Service: Vascular;  Laterality: Right;  . FOOT SURGERY     both feet for something that was torn  . IR FLUORO GUIDE CV LINE RIGHT  11/08/2019  . IR US GUIDE VASC ACCESS RIGHT  11/08/2019  . LIGATION OF ARTERIOVENOUS  FISTULA Right 10/01/2019   Procedure: LIGATION OF RIGHT RADIOCEPHALIC  ARTERIOVENOUS  FISTULA;  Surgeon: Angelia Mould, MD;  Location: [facility];  Service: Vascular;  Laterality: Right;  . TOE AMPUTATION  2010   left 2nd toe  . UPPER GI ENDOSCOPY       reports that he quit smoking about 23 years ago. His smoking use included cigarettes. He has never used smokeless tobacco. He reports that he does not drink alcohol and does not use drugs.  No Known Allergies  Family History  Problem Relation Age of Onset  . Colon cancer Neg Hx      Prior to Admission medications   Medication Sig Start Date End Date Taking? Authorizing Provider  B Complex-C-Folic Acid (NEPHRO-VITE RX PO) Take 1 tablet by mouth at bedtime. 11/27/19  Yes [provider]  calcitRIOL (ROCALTROL) 0.25 MCG capsule Take 0.25 mcg by mouth every Monday, Wednesday, and Friday.  10/27/17  Yes [provider]  cyanocobalamin (,VITAMIN B-12,) 1000 MCG/ML injection Vitamin B12 injection: 1000 mcg (1 mL) by subcu injection once daily for 7 days; then once weekly for 4 weeks; then once monthly thereafter.  7 07/15/2010 Patient taking differently: Inject 1,000 mcg into the muscle every 30 (thirty) days. 12/21/18  Yes Ladell Pier, MD  folic acid (FOLVITE) 1 MG tablet TAKE 1 TABLET BY MOUTH EVERY DAY Patient taking differently: Take 1 mg by mouth daily. 09/06/19  Yes Ladell Pier, MD  insulin NPH Human (NOVOLIN N) 100 UNIT/ML injection Inject 0.5 mLs (50 Units total) into the skin daily before  breakfast. Patient taking differently: Inject 50 Units into the skin daily as needed (high blood sugar). 11/14/19  Yes Vann, Jessica U, DO  melatonin 1 MG TABS tablet Take 3 mg by mouth at bedtime as needed (sleep).   Yes [provider]  midodrine (PROAMATINE) 10 MG tablet Take 10 mg by mouth 3 (three) times a week. Cain Saupe, Sat 11/22/19  Yes [provider]  pravastatin (PRAVACHOL) 40 MG tablet Take 40 mg by mouth at bedtime.   Yes [provider]    Physical Exam: Vitals:   02/10/20 1730 02/10/20 1745 02/10/20 1800 02/10/20 1815  BP: 107/65 114/67 115/62 115/63  Pulse: 85 85 90 (!) 59  Resp: 11 (!) 25 19 20   Temp:      TempSrc:      SpO2: 98% 93% 96% 93%      Constitutional: Acutely ill looking no distress Vitals:   02/10/20 1730 02/10/20 1745 02/10/20 1800 02/10/20 1815  BP: 107/65 114/67 115/62  115/63  Pulse: 85 85 90 (!) 59  Resp: 11 (!) 25 19 20   Temp:      TempSrc:      SpO2: 98% 93% 96% 93%   Eyes: PERRL, lids and conjunctivae normal ENMT: Mucous membranes are moist. Posterior pharynx clear of any exudate or lesions.Normal dentition.  Neck: normal, supple, no masses, no thyromegaly Respiratory: clear to auscultation bilaterally, no wheezing, no crackles. Normal respiratory effort. No accessory muscle use.  Cardiovascular: Regular rate and rhythm, no murmurs / rubs / gallops. No extremity edema. 2+ pedal pulses. No carotid bruits.  Abdomen: no tenderness, no masses palpated. No hepatosplenomegaly. Bowel sounds positive.  Musculoskeletal: no clubbing / cyanosis.  Right foot swollen, second and third and fourth digits macerated, gangrenous, swollen foot all the way to the ankle skin: no rashes, lesions, ulcers. No induration Neurologic: CN 2-12 grossly intact. Sensation intact, DTR normal. Strength 5/5 in all 4.  Psychiatric: Normal judgment and insight. Alert and oriented x 3. Normal mood.     Labs on Admission: I have personally reviewed  following labs and imaging studies  CBC: Recent Labs  Lab 02/10/20 1612  WBC 13.8*  NEUTROABS 4.5  HGB 12.0*  HCT 40.9  MCV 90.1  PLT 016*   Basic Metabolic Panel: Recent Labs  Lab 02/10/20 1612  NA 139  K 3.8  CL 94*  CO2 32  GLUCOSE 171*  BUN 45*  CREATININE 5.13*  CALCIUM 9.3   GFR: Estimated Creatinine Clearance: 14.7 mL/min (A) (by C-G formula based on SCr of 5.13 mg/dL (H)). Liver Function Tests: No results for input(s): AST, ALT, ALKPHOS, BILITOT, PROT, ALBUMIN in the last 168 hours. No results for input(s): LIPASE, AMYLASE in the last 168 hours. No results for input(s): AMMONIA in the last 168 hours. Coagulation Profile: No results for input(s): INR, PROTIME in the last 168 hours. Cardiac Enzymes: No results for input(s): CKTOTAL, CKMB, CKMBINDEX, TROPONINI in the last 168 hours. BNP (last 3 results) No results for input(s): PROBNP in the last 8760 hours. HbA1C: No results for input(s): HGBA1C in the last 72 hours. CBG: No results for input(s): GLUCAP in the last 168 hours. Lipid Profile: No results for input(s): CHOL, HDL, LDLCALC, TRIG, CHOLHDL, LDLDIRECT in the last 72 hours. Thyroid Function Tests: No results for input(s): TSH, T4TOTAL, FREET4, T3FREE, THYROIDAB in the last 72 hours. Anemia Panel: No results for input(s): VITAMINB12, FOLATE, FERRITIN, TIBC, IRON, RETICCTPCT in the last 72 hours. Urine analysis:    Component Value Date/Time   COLORURINE YELLOW 07/11/2018 0802   APPEARANCEUR CLEAR 07/11/2018 0802   LABSPEC 1.009 07/11/2018 0802   PHURINE 6.0 07/11/2018 0802   GLUCOSEU NEGATIVE 07/11/2018 0802   HGBUR NEGATIVE 07/11/2018 0802   BILIRUBINUR NEGATIVE 07/11/2018 0802   KETONESUR NEGATIVE 07/11/2018 0802   PROTEINUR 100 (A) 07/11/2018 0802   NITRITE NEGATIVE 07/11/2018 0802   LEUKOCYTESUR NEGATIVE 07/11/2018 0802   Sepsis Labs: @LABRCNTIP (procalcitonin:4,lacticidven:4) ) Recent Results (from the past 240 hour(s))  SARS  Coronavirus 2 by RT PCR (hospital order, performed in [place] hospital lab)     Status: None   Collection Time: 02/10/20  7:00 PM  Result Value Ref Range Status   SARS Coronavirus 2 NEGATIVE NEGATIVE Final    Comment: (NOTE) SARS-CoV-2 target nucleic acids are NOT DETECTED.  The SARS-CoV-2 RNA is generally detectable in upper and lower respiratory specimens during the acute phase of infection. The lowest concentration of SARS-CoV-2 viral copies this assay can detect is 250 copies /  mL. A negative result does not preclude SARS-CoV-2 infection and should not be used as the sole basis for treatment or other patient management decisions.  A negative result may occur with improper specimen collection / handling, submission of specimen other than nasopharyngeal swab, presence of viral mutation(s) within the areas targeted by this assay, and inadequate number of viral copies (<250 copies / mL). A negative result must be combined with clinical observations, patient history, and epidemiological information.  Fact Sheet for Patients:   StrictlyIdeas.no  Fact Sheet for Healthcare Providers: BankingDealers.co.za  This test is not yet approved or  cleared by the [place] FDA and has been authorized for detection and/or diagnosis of SARS-CoV-2 by FDA under an Emergency Use Authorization (EUA).  This EUA will remain in effect (meaning this test can be used) for the duration of the COVID-19 declaration under Section 564(b)(1) of the Act, 21 U.S.C. section 360bbb-3(b)(1), unless the authorization is terminated or revoked sooner.  Performed at [facility], [place] [address], [place], [place] [address]      Radiological Exams on Admission: DG Foot Complete Right  Result Date: 02/10/2020 CLINICAL DATA:  Osteomyelitis. EXAM: RIGHT FOOT COMPLETE - 3+ VIEW COMPARISON:  January 02, 2020 FINDINGS: Again noted are findings of pes  planus. There are advanced vascular calcifications. There are degenerative changes of the midfoot. There is a large linear metallic foreign body in the plantar soft tissues of the foot at the level of the mid first metatarsal. There is a moderate-sized plantar calcaneal spur. There is soft tissue swelling about the forefoot. No definite acute displaced fracture or dislocation. No evidence for osteomyelitis. IMPRESSION: 1. No evidence for osteomyelitis. 2. Large linear metallic foreign body in the plantar soft tissues of the foot at the level of the mid first metatarsal. 3. Soft tissue swelling about the forefoot. No acute displaced fracture or dislocation. Electronically Signed   By: Constance Holster M.D.   On: 02/10/2020 17:42     Assessment/Plan Principal Problem:   Cellulitis and abscess of toe of right foot Active Problems:   Chronic lymphocytic leukemia (CLL), B-cell (HCC)   PAD (peripheral artery disease) (HCC)   CKD (chronic kidney disease)   DM2 (diabetes mellitus, type 2) (H[facility])     #1 cellulitis and abscess of the right foot with gangrene: Patient will be admitted to the hospital.  Initial vancomycin and cefepime.  Most likely as a result of peripheral vascular disease and diabetes.  We will consult orthopedics Dr. Sharol Given who did his previous surgery.  Continue supportive care.  #2 end-stage renal disease: Will be admitted to [facility].  Hemodialysis was missed today.  Nephrology needs consultation.  #3 peripheral vascular disease: Has seen Dr. Scot Dock before.  Will likely consult him for further evaluation.  #4 diabetes: Sliding scale insulin.  Continue close monitoring  #5 history of CLL: Stable.  Was seen by Dr. Benay Spice in the past.   DVT prophylaxis: Lovenox Code Status: Full code Family Communication: No family at bedside Disposition Plan: To be determined Consults called: Nephrology consulted tonight and Dr. Sharol Given to be consulted in the morning Admission status:  Inpatient  Severity of Illness: The appropriate patient status for this patient is INPATIENT. Inpatient status is judged to be reasonable and necessary in order to provide the required intensity of service to ensure the patient's safety. The patient's presenting symptoms, physical exam findings, and initial radiographic and laboratory data in the context of their chronic comorbidities is felt to  place them at high risk for further clinical deterioration. Furthermore, it is not anticipated that the patient will be medically stable for discharge from the hospital within 2 midnights of admission. The following factors support the patient status of inpatient.   " The patient's presenting symptoms include right foot swelling. " The worrisome physical exam findings include gangrenous right toes. " The initial radiographic and laboratory data are worrisome because of evidence of end-stage renal disease. " The chronic co-morbidities include end-stage renal disease and diabetes.   * I certify that at the point of admission it is my clinical judgment that the patient will require inpatient hospital care spanning beyond 2 midnights from the point of admission due to high intensity of service, high risk for further deterioration and high frequency of surveillance required.Barbette Merino MD [facility] Pager [phone]  If 7PM-7AM, please contact night-coverage [email or web address] Password [facility]  02/10/2020, 9:51 PM

## 2020-02-10 NOTE — ED Provider Notes (Addendum)
[facility] DEPT Provider Note   CSN: [id number] Arrival date & time: 02/10/20  1500     History Chief Complaint  Patient presents with  . Foot Pain    Thomas Mcguire is a 80 y.o. male.  Pt presents to the ED today with black toes.  Pt said he has had a wound to his right toes since November.  He's been going to wound care, but it is getting worse.  He noticed that his toes were turning black this week.  He is scheduled to see vascular this week.  He has seen Dr. Scot Dock in the past and had a fem-pop bypass in June of 2020.  He last had ABIs done in August which monophasic waveforms and noncompressible arteries.  The pt has had prior amputations by Dr. Sharol Given.  Now, pt has noticed some redness going up his right leg.  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  He did go to dialysis yesterday.         Past Medical History:  Diagnosis Date  . Anemia    low iron  . Arthritis   . Chronic renal insufficiency    CHECKED Q3MOS PER PT. Stage 4 as of 05/22/2018 per pt.  . Diabetes mellitus    type 2  . ESRD (end stage renal disease) ([place])   . Gout    Has not had recently- 08/24/11  . Hyperlipidemia   . Hypertension   . 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 PAD (peripheral artery disease) ([place])   . Wears dentures     Patient Active Problem List   Diagnosis Date Noted  . Fluid overload 11/07/2019  . Cardiomegaly 11/07/2019  . DM2 (diabetes mellitus, type 2) ([place]) 11/07/2019  . CKD (chronic kidney disease) 10/06/2018  . PAD (peripheral artery disease) ([place]) 07/11/2018  . B12 deficiency 11/30/2017  . Folic acid deficiency 40/98/1191  . 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 A/V FISTULAGRAM N/A 08/11/2018   Procedure: A/V FISTULAGRAM - Right Arm;  Surgeon: Angelia Mould, MD;  Location: [facility];  Service: Cardiovascular;  Laterality:  N/A;  . A/V FISTULAGRAM N/A 09/07/2019   Procedure: A/V FISTULAGRAM - Right AVF;  Surgeon: Angelia Mould, MD;  Location: [facility];  Service: Cardiovascular;  Laterality: N/A;  . ABDOMINAL AORTOGRAM W/LOWER EXTREMITY Right 07/03/2018   Procedure: ABDOMINAL AORTOGRAM W/LOWER EXTREMITY;  Surgeon: Waynetta Sandy, MD;  Location: [facility];  Service: Cardiovascular;  Laterality: Right;  . AMPUTATION  08/27/2011   Procedure: AMPUTATION DIGIT;  Surgeon: Newt Minion, MD;  Location: [facility];  Service: Orthopedics;  Laterality: Left;  Left Foot 3rd toe Amputation MTP joint  . AMPUTATION Right 07/11/2018   Procedure: AMPUTATION RIGHT GREAT TOE;  Surgeon: Angelia Mould, MD;  Location: [facility];  Service: Vascular;  Laterality: Right;  . amputation great toe  2009   left  . AV FISTULA PLACEMENT Right 05/23/2018   Procedure: RADIAL- CEPHALIC ARTERIOVENOUS (AV) FISTULA CREATION RIGHT ARM AND LIGATION OF COMPETING BRANCH.;  Surgeon: Angelia Mould, MD;  Location: [facility];  Service: Vascular;  Laterality: Right;  . BASCILIC VEIN TRANSPOSITION Right 10/01/2019   Procedure: FIRST STAGE BASCILIC VEIN TRANSPOSITION;  Surgeon: Angelia Mould, MD;  Location: [facility];  Service: Vascular;  Laterality: Right;  . BASCILIC VEIN TRANSPOSITION Right  11/13/2019   Procedure: RIGHT SECOND STAGE BASCILIC VEIN TRANSPOSITION;  Surgeon: Rosetta Posner, MD;  Location: [facility];  Service: Vascular;  Laterality: Right;  . COLONOSCOPY    . cyst elbow  1999   right  . FEMORAL-POPLITEAL BYPASS GRAFT Right 07/11/2018   Procedure: BYPASS GRAFT FEMORAL-POPLITEAL ARTERY;  Surgeon: Angelia Mould, MD;  Location: [facility];  Service: Vascular;  Laterality: Right;  . FOOT SURGERY     both feet for something that was torn  . IR FLUORO GUIDE CV LINE RIGHT  11/08/2019  . IR US GUIDE VASC ACCESS RIGHT  11/08/2019  . LIGATION OF ARTERIOVENOUS  FISTULA Right 10/01/2019   Procedure: LIGATION OF  RIGHT RADIOCEPHALIC  ARTERIOVENOUS  FISTULA;  Surgeon: Angelia Mould, MD;  Location: [facility];  Service: Vascular;  Laterality: Right;  . TOE AMPUTATION  2010   left 2nd toe  . UPPER GI ENDOSCOPY         Family History  Problem Relation Age of Onset  . Colon cancer Neg Hx     Social History   Tobacco Use  . Smoking status: Former Smoker    Types: Cigarettes    Quit date: 03/11/1996    Years since quitting: 23.9  . Smokeless tobacco: Never Used  Vaping Use  . Vaping Use: Never used  Substance Use Topics  . Alcohol use: No  . Drug use: No    Home Medications Prior to Admission medications   Medication Sig Start Date End Date Taking? Authorizing Provider  B Complex-C-Folic Acid (NEPHRO-VITE RX PO) Take 1 tablet by mouth at bedtime. 11/27/19  Yes [provider]  calcitRIOL (ROCALTROL) 0.25 MCG capsule Take 0.25 mcg by mouth every Monday, Wednesday, and Friday.  10/27/17  Yes [provider]  cyanocobalamin (,VITAMIN B-12,) 1000 MCG/ML injection Vitamin B12 injection: 1000 mcg (1 mL) by subcu injection once daily for 7 days; then once weekly for 4 weeks; then once monthly thereafter.  7 07/15/2010 Patient taking differently: Inject 1,000 mcg into the muscle every 30 (thirty) days. 12/21/18  Yes Ladell Pier, MD  folic acid (FOLVITE) 1 MG tablet TAKE 1 TABLET BY MOUTH EVERY DAY Patient taking differently: Take 1 mg by mouth daily. 09/06/19  Yes Ladell Pier, MD  insulin NPH Human (NOVOLIN N) 100 UNIT/ML injection Inject 0.5 mLs (50 Units total) into the skin daily before breakfast. Patient taking differently: Inject 50 Units into the skin daily as needed (high blood sugar). 11/14/19  Yes Vann, Jessica U, DO  melatonin 1 MG TABS tablet Take 3 mg by mouth at bedtime as needed (sleep).   Yes [provider]  midodrine (PROAMATINE) 10 MG tablet Take 10 mg by mouth 3 (three) times a week. Cain Saupe, Sat 11/22/19  Yes [provider]   pravastatin (PRAVACHOL) 40 MG tablet Take 40 mg by mouth at bedtime.   Yes [provider]    Allergies    Patient has no known allergies.  Review of Systems   Review of Systems  Skin: Positive for color change and wound.  All other systems reviewed and are negative.   Physical Exam Updated Vital Signs BP 115/63   Pulse (!) 59   Temp 97.7 F (36.5 C) (Oral)   Resp 20   SpO2 93%   Physical Exam Vitals and nursing note reviewed.  Constitutional:      Appearance: Normal appearance.  HENT:     Head: Normocephalic and atraumatic.  Right Ear: External ear normal.     Left Ear: External ear normal.     Nose: Nose normal.     Mouth/Throat:     Mouth: Mucous membranes are moist.     Pharynx: Oropharynx is clear.  Eyes:     Extraocular Movements: Extraocular movements intact.     Conjunctiva/sclera: Conjunctivae normal.     Pupils: Pupils are equal, round, and reactive to light.  Cardiovascular:     Rate and Rhythm: Normal rate and regular rhythm.     Pulses: Normal pulses.     Heart sounds: Normal heart sounds.  Pulmonary:     Effort: Pulmonary effort is normal.     Breath sounds: Normal breath sounds.  Chest:     Comments: Dialysis cath right chest Abdominal:     General: Abdomen is flat. Bowel sounds are normal.     Palpations: Abdomen is soft.  Musculoskeletal:     Cervical back: Normal range of motion and neck supple.  Skin:    Capillary Refill: Capillary refill takes less than 2 seconds.     Comments: Necrotic right 2nd, 3rd, 4th toe.  Amputation to right great toe.  Ascending cellulitis to mid calf.  Neurological:     General: No focal deficit present.     Mental Status: He is alert and oriented to person, place, and time.  Psychiatric:        Mood and Affect: Mood normal.        Behavior: Behavior normal.       ED Results / Procedures / Treatments   Labs (all labs ordered are listed, but only abnormal results are displayed) Labs Reviewed   BASIC METABOLIC PANEL - Abnormal; Notable for the following components:      Result Value   Chloride 94 (*)    Glucose, Bld 171 (*)    BUN 45 (*)    Creatinine, Ser 5.13 (*)    GFR, Estimated 11 (*)    All other components within normal limits  CBC WITH DIFFERENTIAL/PLATELET - Abnormal; Notable for the following components:   WBC 13.8 (*)    Hemoglobin 12.0 (*)    MCHC 29.3 (*)    RDW 19.1 (*)    Platelets 138 (*)    Lymphs Abs 8.0 (*)    Monocytes Absolute 1.2 (*)    All other components within normal limits    EKG EKG Interpretation  Date/Time:  Sunday February 10 2020 16:25:25 EST Ventricular Rate:  88 PR Interval:    QRS Duration: 121 QT Interval:  408 QTC Calculation: 494 R Axis:   88 Text Interpretation: Sinus rhythm Multiple ventricular premature complexes Probable left atrial enlargement LVH with secondary repolarization abnormality Borderline prolonged QT interval Confirmed by Isla Pence [phone]) on 02/10/2020 5:03:52 PM   Radiology DG Foot Complete Right  Result Date: 02/10/2020 CLINICAL DATA:  Osteomyelitis. 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 There are advanced vascular calcifications. There are degenerative changes of the midfoot. There is a large linear metallic foreign body in the plantar soft tissues of the foot at the level of the mid first metatarsal. There is a moderate-sized plantar calcaneal spur. There is soft tissue swelling about the forefoot. No definite acute displaced fracture or dislocation. No evidence for osteomyelitis. IMPRESSION: 1. No evidence for osteomyelitis. 2. Large linear metallic foreign body in the plantar soft tissues of the foot at the level of  the mid first metatarsal. 3. Soft tissue swelling about the forefoot. No acute displaced fracture or dislocation. Electronically Signed   By: Constance Holster M.D.   On: 02/10/2020 17:42     Procedures Procedures   Medications Ordered in ED Medications  0.9 %  sodium chloride infusion ( Intravenous New Bag/Given 02/10/20 1635)  vancomycin (VANCOREADY) IVPB 2000 mg/400 mL (2,000 mg Intravenous New Bag/Given 02/10/20 1720)  piperacillin-tazobactam (ZOSYN) IVPB 3.375 g (0 g Intravenous Stopped 02/10/20 1707)    ED Course  I have reviewed the triage vital signs and the nursing notes.  Pertinent labs & imaging results that were available during my care of the patient were reviewed by me and considered in my medical decision making (see chart for details).    MDM Rules/Calculators/A&P                          Pt started on vancomycin and zosyn.  He was d/w Dr. Jonelle Sidle (triad) for admission.  CRITICAL CARE Performed by: Isla Pence   Total critical care time: 30 minutes  Critical care time was exclusive of separately billable procedures and treating other patients.  Critical care was necessary to treat or prevent imminent or life-threatening deterioration.  Critical care was time spent personally by me on the following activities: development of treatment plan with patient and/or surrogate as well as nursing, discussions with consultants, evaluation of patient's response to treatment, examination of patient, obtaining history from patient or surrogate, ordering and performing treatments and interventions, ordering and review of laboratory studies, ordering and review of radiographic studies, pulse oximetry and re-evaluation of patient's condition.  Final Clinical Impression(s) / ED Diagnoses Final diagnoses:  ESRD on hemodialysis ([place])  Necrotic toes ([place])  Cellulitis of right lower extremity  PVD (peripheral vascular disease) ([place])    Rx / DC Orders ED Discharge Orders    None       Isla Pence, MD 02/10/20 1830    Isla Pence, MD 02/26/20 [phone]

## 2020-02-10 NOTE — ED Triage Notes (Addendum)
Patient reports wound to right foot since November. Reports followed by wound center and has an appointment with vein specialist this week. C/o darkening on toes and pain to foot today.

## 2020-02-10 NOTE — Progress Notes (Signed)
A consult was received from an ED physician for vancomycin and Zosyn per pharmacy dosing.  The patient's profile has been reviewed for ht/wt/allergies/indication/available labs.   A one time order has been placed for vancomycin 2000 mg iv once and Zosyn 3.375 g x 1.  Further antibiotics/pharmacy consults should be ordered by admitting physician if indicated.                       Thank you, [place] Form 02/10/2020  4:20 PM

## 2020-02-10 NOTE — Progress Notes (Signed)
Pharmacy Antibiotic Note  Thomas Mcguire is a 80 y.o. male with a h/o ESRD on HD T/Th/Sa admitted on 02/10/2020 with cellulitis and abscess of foot.  Pharmacy has been consulted for vancomycin and cefepime dosing.   Plan: Cefepime 1 g iv q 24 hours with first dose ~ 8 hours after initial Zosyn dose  Vancomycin 2000 mg iv once followed by 1000 mg iv qHD  F/U culture results, HD tolerance, levels if indicated     Temp (24hrs), Avg:97.7 F (36.5 C), Min:97.7 F (36.5 C), Max:97.7 F (36.5 C)  Recent Labs  Lab 02/10/20 1612  WBC 13.8*  CREATININE 5.13*    Estimated Creatinine Clearance: 14.7 mL/min (A) (by C-G formula based on SCr of 5.13 mg/dL (H)).    No Known Allergies  Antimicrobials this admission: 1/30 Zosyn x 1 1/30 vancomycin >>  1/30 cefepime >>  Dose adjustments this admission:  Microbiology results:  Thank you for allowing pharmacy to be a part of this patient's care.  Ulice Dash D 02/10/2020 6:59 PM

## 2020-02-11 ENCOUNTER — Encounter (HOSPITAL_COMMUNITY): Payer: Self-pay | Admitting: Internal Medicine

## 2020-02-11 DIAGNOSIS — I70261 Atherosclerosis of native arteries of extremities with gangrene, right leg: Secondary | ICD-10-CM

## 2020-02-11 DIAGNOSIS — L02611 Cutaneous abscess of right foot: Secondary | ICD-10-CM | POA: Diagnosis not present

## 2020-02-11 DIAGNOSIS — Z992 Dependence on renal dialysis: Secondary | ICD-10-CM

## 2020-02-11 DIAGNOSIS — N186 End stage renal disease: Secondary | ICD-10-CM

## 2020-02-11 DIAGNOSIS — L03031 Cellulitis of right toe: Secondary | ICD-10-CM | POA: Diagnosis not present

## 2020-02-11 DIAGNOSIS — I96 Gangrene, not elsewhere classified: Secondary | ICD-10-CM

## 2020-02-11 LAB — COMPREHENSIVE METABOLIC PANEL
ALT: 33 U/L (ref 0–44)
AST: 32 U/L (ref 15–41)
Albumin: 3.3 g/dL — ABNORMAL LOW (ref 3.5–5.0)
Alkaline Phosphatase: 56 U/L (ref 38–126)
Anion gap: 13 (ref 5–15)
BUN: 45 mg/dL — ABNORMAL HIGH (ref 8–23)
CO2: 28 mmol/L (ref 22–32)
Calcium: 8.9 mg/dL (ref 8.9–10.3)
Chloride: 95 mmol/L — ABNORMAL LOW (ref 98–111)
Creatinine, Ser: 5.25 mg/dL — ABNORMAL HIGH (ref 0.61–1.24)
GFR, Estimated: 11 mL/min — ABNORMAL LOW (ref >60)
Glucose, Bld: 141 mg/dL — ABNORMAL HIGH (ref 70–99)
Potassium: 4.5 mmol/L (ref 3.5–5.1)
Sodium: 136 mmol/L (ref 135–145)
Total Bilirubin: 1.4 mg/dL — ABNORMAL HIGH (ref 0.3–1.2)
Total Protein: 6 g/dL — ABNORMAL LOW (ref 6.5–8.1)

## 2020-02-11 LAB — CBC
HCT: 39.1 % (ref 39.0–52.0)
HCT: 40.3 % (ref 39.0–52.0)
Hemoglobin: 11.7 g/dL — ABNORMAL LOW (ref 13.0–17.0)
Hemoglobin: 11.9 g/dL — ABNORMAL LOW (ref 13.0–17.0)
MCH: 27 pg (ref 26.0–34.0)
MCH: 26.4 pg (ref 26.0–34.0)
MCHC: 29.9 g/dL — ABNORMAL LOW (ref 30.0–36.0)
MCHC: 29.5 g/dL — ABNORMAL LOW (ref 30.0–36.0)
MCV: 90.3 fL (ref 80.0–100.0)
MCV: 89.4 fL (ref 80.0–100.0)
Platelets: 127 10*3/uL — ABNORMAL LOW (ref 150–400)
Platelets: 139 10*3/uL — ABNORMAL LOW (ref 150–400)
RBC: 4.33 MIL/uL (ref 4.22–5.81)
RBC: 4.51 MIL/uL (ref 4.22–5.81)
RDW: 19 % — ABNORMAL HIGH (ref 11.5–15.5)
RDW: 19.3 % — ABNORMAL HIGH (ref 11.5–15.5)
WBC: 12.8 10*3/uL — ABNORMAL HIGH (ref 4.0–10.5)
WBC: 13.1 10*3/uL — ABNORMAL HIGH (ref 4.0–10.5)
nRBC: 0 % (ref 0.0–0.2)
nRBC: 0 % (ref 0.0–0.2)

## 2020-02-11 LAB — HEMOGLOBIN A1C
Hgb A1c MFr Bld: 6.1 % — ABNORMAL HIGH (ref 4.8–5.6)
Mean Plasma Glucose: 128.37 mg/dL

## 2020-02-11 LAB — CBG MONITORING, ED
Glucose-Capillary: 112 mg/dL — ABNORMAL HIGH (ref 70–99)
Glucose-Capillary: 136 mg/dL — ABNORMAL HIGH (ref 70–99)
Glucose-Capillary: 161 mg/dL — ABNORMAL HIGH (ref 70–99)

## 2020-02-11 LAB — CREATININE, SERUM
Creatinine, Ser: 5.49 mg/dL — ABNORMAL HIGH (ref 0.61–1.24)
GFR, Estimated: 10 mL/min — ABNORMAL LOW (ref >60)

## 2020-02-11 LAB — GLUCOSE, CAPILLARY: Glucose-Capillary: 181 mg/dL — ABNORMAL HIGH (ref 70–99)

## 2020-02-11 MED ORDER — ONDANSETRON HCL 4 MG PO TABS: 4.0000 mg | ORAL_TABLET | Freq: Four times a day (QID) | ORAL | Status: DC | PRN

## 2020-02-11 MED ORDER — INSULIN ASPART 100 UNIT/ML ~~LOC~~ SOLN
0.0000 [IU] | Freq: Three times a day (TID) | SUBCUTANEOUS | Status: DC
  Administered 2020-02-11 (×2): 3 [IU] via SUBCUTANEOUS
  Administered 2020-02-12 – 2020-02-14 (×3): 2 [IU] via SUBCUTANEOUS
  Administered 2020-02-15: 3 [IU] via SUBCUTANEOUS
  Administered 2020-02-16: 2 [IU] via SUBCUTANEOUS
  Administered 2020-02-16: 3 [IU] via SUBCUTANEOUS
  Administered 2020-02-17: 2 [IU] via SUBCUTANEOUS
  Administered 2020-02-17: 3 [IU] via SUBCUTANEOUS
  Administered 2020-02-18: 2 [IU] via SUBCUTANEOUS
  Administered 2020-02-18: 3 [IU] via SUBCUTANEOUS
  Administered 2020-02-18 – 2020-02-19 (×3): 2 [IU] via SUBCUTANEOUS
  Administered 2020-02-20: 5 [IU] via SUBCUTANEOUS
  Filled 2020-02-11: qty 0.15

## 2020-02-11 MED ORDER — SODIUM CHLORIDE 0.9 % IV SOLN: INTRAVENOUS | Status: DC

## 2020-02-11 MED ORDER — CYANOCOBALAMIN 1000 MCG/ML IJ SOLN
1000.0000 ug | INTRAMUSCULAR | Status: DC
  Administered 2020-02-11: 1000 ug via INTRAMUSCULAR
  Filled 2020-02-11: qty 1

## 2020-02-11 MED ORDER — ENOXAPARIN SODIUM 30 MG/0.3ML ~~LOC~~ SOLN: 30.0000 mg | SUBCUTANEOUS | Status: DC

## 2020-02-11 MED ORDER — ACETAMINOPHEN 650 MG RE SUPP: 650.0000 mg | Freq: Four times a day (QID) | RECTAL | Status: DC | PRN

## 2020-02-11 MED ORDER — CHLORHEXIDINE GLUCONATE CLOTH 2 % EX PADS
6.0000 | MEDICATED_PAD | Freq: Every day | CUTANEOUS | Status: DC
  Administered 2020-02-11 – 2020-02-13 (×3): 6 via TOPICAL

## 2020-02-11 MED ORDER — TRAZODONE HCL 50 MG PO TABS
50.0000 mg | ORAL_TABLET | Freq: Every evening | ORAL | Status: DC | PRN
  Administered 2020-02-11 – 2020-02-19 (×5): 50 mg via ORAL
  Filled 2020-02-11 (×4): qty 1

## 2020-02-11 MED ORDER — INSULIN NPH (HUMAN) (ISOPHANE) 100 UNIT/ML ~~LOC~~ SUSP: 50.0000 [IU] | Freq: Every day | SUBCUTANEOUS | Status: DC | PRN

## 2020-02-11 MED ORDER — INSULIN ASPART 100 UNIT/ML ~~LOC~~ SOLN
0.0000 [IU] | Freq: Every day | SUBCUTANEOUS | Status: DC
  Filled 2020-02-11: qty 0.05

## 2020-02-11 MED ORDER — MORPHINE SULFATE (PF) 2 MG/ML IV SOLN
2.0000 mg | INTRAVENOUS | Status: DC | PRN
  Administered 2020-02-11 – 2020-02-17 (×17): 2 mg via INTRAVENOUS
  Filled 2020-02-11 (×15): qty 1

## 2020-02-11 MED ORDER — MELATONIN 3 MG PO TABS
3.0000 mg | ORAL_TABLET | Freq: Every evening | ORAL | Status: DC | PRN
  Administered 2020-02-11: 3 mg via ORAL
  Filled 2020-02-11: qty 1

## 2020-02-11 MED ORDER — HEPARIN SODIUM (PORCINE) 5000 UNIT/ML IJ SOLN
5000.0000 [IU] | Freq: Three times a day (TID) | INTRAMUSCULAR | Status: DC
  Administered 2020-02-11 – 2020-02-14 (×9): 5000 [IU] via SUBCUTANEOUS
  Filled 2020-02-11 (×10): qty 1

## 2020-02-11 MED ORDER — ONDANSETRON HCL 4 MG/2ML IJ SOLN
4.0000 mg | Freq: Four times a day (QID) | INTRAMUSCULAR | Status: DC | PRN
  Filled 2020-02-11: qty 2

## 2020-02-11 MED ORDER — FOLIC ACID 1 MG PO TABS
1.0000 mg | ORAL_TABLET | Freq: Every day | ORAL | Status: DC
  Administered 2020-02-11 – 2020-02-20 (×9): 1 mg via ORAL
  Filled 2020-02-11 (×10): qty 1

## 2020-02-11 MED ORDER — CALCITRIOL 0.25 MCG PO CAPS
0.2500 ug | ORAL_CAPSULE | ORAL | Status: DC
  Administered 2020-02-11 – 2020-02-20 (×5): 0.25 ug via ORAL
  Filled 2020-02-11 (×8): qty 1

## 2020-02-11 MED ORDER — RENA-VITE PO TABS
1.0000 | ORAL_TABLET | Freq: Every day | ORAL | Status: DC
  Administered 2020-02-11 – 2020-02-20 (×11): 1 via ORAL
  Filled 2020-02-11 (×12): qty 1

## 2020-02-11 MED ORDER — MIDODRINE HCL 5 MG PO TABS
10.0000 mg | ORAL_TABLET | ORAL | Status: DC
  Administered 2020-02-11: 10 mg via ORAL
  Filled 2020-02-11 (×2): qty 2

## 2020-02-11 MED ORDER — ACETAMINOPHEN 325 MG PO TABS: 650.0000 mg | ORAL_TABLET | Freq: Four times a day (QID) | ORAL | Status: DC | PRN

## 2020-02-11 MED ORDER — PRAVASTATIN SODIUM 40 MG PO TABS
40.0000 mg | ORAL_TABLET | Freq: Every day | ORAL | Status: DC
  Administered 2020-02-11 – 2020-02-20 (×11): 40 mg via ORAL
  Filled 2020-02-11 (×5): qty 1
  Filled 2020-02-11: qty 2
  Filled 2020-02-11 (×5): qty 1

## 2020-02-11 NOTE — ED Notes (Signed)
[facility] present to transport patient to [facility].

## 2020-02-11 NOTE — Progress Notes (Signed)
PROGRESS NOTE    Thomas Mcguire  VHQ:[id number] DOB: 1940-04-17 DOA: 02/10/2020 PCP: Leanna Battles, MD    Brief Narrative: HPI per Dr. Jeannie Fend on 02/10/2020- Thomas Mcguire is a 80 y.o. male with medical history significant of CLL, peripheral vascular disease status post femoropopliteal bypass surgery in 2020, chronic ischemic ulcers of the foot with amputation of the right big toe previously, end-stage renal disease on hemodialysis, diabetes, hyperlipidemia and hypertension who presents to the ER with significant blackness redness and swelling of the right foot.  Patient was noted to have gangrene of the second third and fourth toes.  Also worsening erythema and swelling of the foot.  Appears to have cellulitis around it.  His blood sugar has been fine but patient appears to be is significantly requiring further surgical intervention.  He did not go to his dialysis today and so is being admitted to the hospital for evaluation and treatment..  ED Course: Temperature 97.7 blood pressure 120/61 pulse 101 respirate 25 and oxygen sats 92% on room air.  Sodium is 139 potassium 3.8 chloride 94 CO2 32 glucose 171 creatinine 5.13.  White count 13.8 and platelets 138.  COVID-19 is negative.  X-ray of the foot shows no evidence of osteomyelitis but large linear metallic foreign body in the plantar soft tissue of the foot around the mid first metatarsal.  There is soft tissue swelling about the forefoot.  Patient will be admitted initiated on antibiotics and follow-up with orthopedics.  02/11/2020-patient was still in the ER when I saw him.  Since he is a dialysis patient I will transfer him to [facility]  Chart messaged nephrology, Dr. Sharol Given made aware of patient's admission, vascular surgery consulted.   Assessment & Plan:   Principal Problem:   Cellulitis and abscess of toe of right foot Active Problems:   Chronic lymphocytic leukemia (CLL), B-cell ([facility])   PAD (peripheral artery disease) ([facility])   CKD  (chronic kidney disease)   DM2 (diabetes mellitus, type 2) ([facility])   Cellulitis of right foot   #1 Right foot gangrene toes-xray foot no evidence for osteomyelitis.Large linear metallic foreign body in the plantar soft tissues of the foot at the level of the mid first metatarsal.  Soft tissue swelling about the forefoot no acute displaced fracture or dislocation. Consulted Dr. Sharol Given Consulted vascular surgery Continue Vanco and cefepime for now.  #2 ESRD on dialysis Tuesday Thursday and Saturdays transfer patient to [facility].  Discussed with nephrology.  #3 type 2 dm-his hemoglobin A1c is 6.1.  He is on NPH 50 units as needed!!  At home CBG (last 3)  Recent Labs    02/11/20 0030 02/11/20 0804  GLUCAP 136* 112*     #4 history of CLL followed by Dr. Benay Spice  #5 thrombocytopenia monitor closely on heparin  #6 chronic hypotension continue midodrine DC IV fluids.  #7 hyperlipidemia continue Pravachol  Estimated body mass index is 21.42 kg/m as calculated from the following:   Height as of 01/30/20: 6\' 7"  (2.007 m).   Weight as of 01/30/20: 86.2 kg.  DVT prophylaxis: Heparin  code Status: Full code  family Communication: Disposition Plan:  Status is: Inpatient  Dispo: The patient is from: Home              Anticipated d/c is to: Home              Anticipated d/c date is: > 3 days  Patient currently is not medically stable to d/c.   Difficult to place patient not applicable at this time    Consultants:   Nephrology consulted   Vascular consulted Dr. Carlis Abbott  Dr. Sharol Given consulted  Procedures none Antimicrobials: Vanco and cefepime Subjective: Patient resting in bed seen in the ER room He has no sensation to the right forefoot   objective: Vitals:   02/11/20 0600 02/11/20 0700 02/11/20 0800 02/11/20 0900  BP: (!) 116/56 131/64 (!) 114/57 (!) 108/96  Pulse: 81 78 79 99  Resp: 19 18 (!) 22 17  Temp:      TempSrc:      SpO2: 97% 95% 97% 100%     Intake/Output Summary (Last 24 hours) at 02/11/2020 1022 Last data filed at 02/11/2020 0053 Gross per 24 hour  Intake 732.63 ml  Output --  Net 732.63 ml   There were no vitals filed for this visit.  Examination:  General exam: Appears calm and comfortable  Respiratory system: Clear to auscultation. Respiratory effort normal. Cardiovascular system: S1 & S2 heard, RRR. No JVD, murmurs, rubs, gallops or clicks. No pedal edema. Gastrointestinal system: Abdomen is nondistended, soft and nontender. No organomegaly or masses felt. Normal bowel sounds heard. Central nervous system: Alert and oriented. No focal neurological deficits. Extremities right foot with right great toe amputation right second third fourth toe gangrene with some extension to the right small toe. Skin: Cellulitis right foot  psychiatry: Judgement and insight appear normal. Mood & affect appropriate.     Data Reviewed: I have personally reviewed following labs and imaging studies  CBC: Recent Labs  Lab 02/10/20 1612 02/11/20 0235  WBC 13.8* 12.8*  NEUTROABS 4.5  --   HGB 12.0* 11.7*  HCT 40.9 39.1  MCV 90.1 90.3  PLT 138* 948*   Basic Metabolic Panel: Recent Labs  Lab 02/10/20 1612 02/11/20 0235  NA 139 136  K 3.8 4.5  CL 94* 95*  CO2 32 28  GLUCOSE 171* 141*  BUN 45* 45*  CREATININE 5.13* 5.25*  CALCIUM 9.3 8.9   GFR: Estimated Creatinine Clearance: 14.4 mL/min (A) (by C-G formula based on SCr of 5.25 mg/dL (H)). Liver Function Tests: Recent Labs  Lab 02/11/20 0235  AST 32  ALT 33  ALKPHOS 56  BILITOT 1.4*  PROT 6.0*  ALBUMIN 3.3*   No results for input(s): LIPASE, AMYLASE in the last 168 hours. No results for input(s): AMMONIA in the last 168 hours. Coagulation Profile: No results for input(s): INR, PROTIME in the last 168 hours. Cardiac Enzymes: No results for input(s): CKTOTAL, CKMB, CKMBINDEX, TROPONINI in the last 168 hours. BNP (last 3 results) No results for input(s):  PROBNP in the last 8760 hours. HbA1C: Recent Labs    02/11/20 0218  HGBA1C 6.1*   CBG: Recent Labs  Lab 02/11/20 0030 02/11/20 0804  GLUCAP 136* 112*   Lipid Profile: No results for input(s): CHOL, HDL, LDLCALC, TRIG, CHOLHDL, LDLDIRECT in the last 72 hours. Thyroid Function Tests: No results for input(s): TSH, T4TOTAL, FREET4, T3FREE, THYROIDAB in the last 72 hours. Anemia Panel: No results for input(s): VITAMINB12, FOLATE, FERRITIN, TIBC, IRON, RETICCTPCT in the last 72 hours. Sepsis Labs: No results for input(s): PROCALCITON, LATICACIDVEN in the last 168 hours.  Recent Results (from the past 240 hour(s))  SARS Coronavirus 2 by RT PCR (hospital order, performed in [place] hospital lab)     Status: None   Collection Time: 02/10/20  7:00 PM  Result Value Ref Range  Status   SARS Coronavirus 2 NEGATIVE NEGATIVE Final    Comment: (NOTE) SARS-CoV-2 target nucleic acids are NOT DETECTED.  The SARS-CoV-2 RNA is generally detectable in upper and lower respiratory specimens during the acute phase of infection. The lowest concentration of SARS-CoV-2 viral copies this assay can detect is 250 copies / mL. A negative result does not preclude SARS-CoV-2 infection and should not be used as the sole basis for treatment or other patient management decisions.  A negative result may occur with improper specimen collection / handling, submission of specimen other than nasopharyngeal swab, presence of viral mutation(s) within the areas targeted by this assay, and inadequate number of viral copies (<250 copies / mL). A negative result must be combined with clinical observations, patient history, and epidemiological information.  Fact Sheet for Patients:   StrictlyIdeas.no  Fact Sheet for Healthcare Providers: BankingDealers.co.za  This test is not yet approved or  cleared by the [place] FDA and has been authorized for detection  and/or diagnosis of SARS-CoV-2 by FDA under an Emergency Use Authorization (EUA).  This EUA will remain in effect (meaning this test can be used) for the duration of the COVID-19 declaration under Section 564(b)(1) of the Act, 21 U.S.C. section 360bbb-3(b)(1), unless the authorization is terminated or revoked sooner.  Performed at [facility], [place] [address], [place], [place] [address]          Radiology Studies: DG Foot Complete Right  Result Date: 02/10/2020 CLINICAL DATA:  Osteomyelitis. 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 There are advanced vascular calcifications. There are degenerative changes of the midfoot. There is a large linear metallic foreign body in the plantar soft tissues of the foot at the level of the mid first metatarsal. There is a moderate-sized plantar calcaneal spur. There is soft tissue swelling about the forefoot. No definite acute displaced fracture or dislocation. No evidence for osteomyelitis. IMPRESSION: 1. No evidence for osteomyelitis. 2. Large linear metallic foreign body in the plantar soft tissues of the foot at the level of the mid first metatarsal. 3. Soft tissue swelling about the forefoot. No acute displaced fracture or dislocation. Electronically Signed   By: Constance Holster M.D.   On: 02/10/2020 17:42        Scheduled Meds: . calcitRIOL  0.25 mcg Oral Q M,W,F  . cyanocobalamin  1,000 mcg Intramuscular Q30 days  . enoxaparin (LOVENOX) injection  30 mg Subcutaneous Q24H  . folic acid  1 mg Oral Daily  . insulin aspart  0-15 Units Subcutaneous TID WC  . insulin aspart  0-5 Units Subcutaneous QHS  . midodrine  10 mg Oral Once per day on Mon Wed Fri  . multivitamin  1 tablet Oral QHS  . pravastatin  40 mg Oral QHS   Continuous Infusions: . ceFEPime (MAXIPIME) IV Stopped (02/11/20 0150)  . [START ON 02/12/2020] vancomycin       LOS: 1 day    Georgette Shell, MD 02/11/2020, 10:22 AM

## 2020-02-11 NOTE — Plan of Care (Signed)
  Problem: Education: Goal: Knowledge of disease and its progression will improve Outcome: Progressing   Problem: Clinical Measurements: Goal: Complications related to the disease process, condition or treatment will be avoided or minimized Outcome: Progressing   

## 2020-02-11 NOTE — ED Notes (Signed)
Attempted to call report, per unit call back in 5 mins.  [facility] contacted for transport.

## 2020-02-11 NOTE — Consult Note (Signed)
Hospital Consult    Reason for Consult:  Tissue loss right foot, previous fem pop bypass Referring Physician:  Dr. Rodena Piety, Hospitalist MRN #:  [id number]  History of Present Illness: This is a 80 y.o. male with history of end-stage renal disease on HD Tues/Thurs/Sat, peripheral vascular disease status post right common femoral to below-knee popliteal bypass with vein in 2020 by Dr. Scot Dock, diabetes, CLL that vascular surgery has been consulted for new tissue loss in the right foot.  Patient states he injured his foot after scratching it on some furniture in November and ultimately this initially scabbed over.  Then he remembers around Christmas he developed blackening of his toes.  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  He is ambulatory.  He was last seen with surveillance of his bypass in 08/2019 with moderate stenosis in the outflow and monophasic runoff distally.  Looking at his old arteriogram pictures only has peroneal runoff.  Past Medical History:  Diagnosis Date  . Anemia    low iron  . Arthritis   . Chronic renal insufficiency    CHECKED Q3MOS PER PT. Stage 4 as of 05/22/2018 per pt.  . Diabetes mellitus    type 2  . ESRD (end stage renal disease) ([place])   . Gout    Has not had recently- 08/24/11  . Hyperlipidemia   . Hypertension   . 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 PAD (peripheral artery disease) ([place])   . Wears dentures     Past Surgical History:  Procedure Laterality Date  . A/V FISTULAGRAM N/A 08/11/2018   Procedure: A/V FISTULAGRAM - Right Arm;  Surgeon: Angelia Mould, MD;  Location: [facility];  Service: Cardiovascular;  Laterality: N/A;  . A/V FISTULAGRAM N/A 09/07/2019   Procedure: A/V FISTULAGRAM - Right AVF;  Surgeon: Angelia Mould, MD;  Location: [facility];  Service: Cardiovascular;  Laterality: N/A;  . ABDOMINAL AORTOGRAM W/LOWER EXTREMITY Right 07/03/2018    Procedure: ABDOMINAL AORTOGRAM W/LOWER EXTREMITY;  Surgeon: Waynetta Sandy, MD;  Location: [facility];  Service: Cardiovascular;  Laterality: Right;  . AMPUTATION  08/27/2011   Procedure: AMPUTATION DIGIT;  Surgeon: Newt Minion, MD;  Location: [facility];  Service: Orthopedics;  Laterality: Left;  Left Foot 3rd toe Amputation MTP joint  . AMPUTATION Right 07/11/2018   Procedure: AMPUTATION RIGHT GREAT TOE;  Surgeon: Angelia Mould, MD;  Location: [facility];  Service: Vascular;  Laterality: Right;  . amputation great toe  2009   left  . AV FISTULA PLACEMENT Right 05/23/2018   Procedure: RADIAL- CEPHALIC ARTERIOVENOUS (AV) FISTULA CREATION RIGHT ARM AND LIGATION OF COMPETING BRANCH.;  Surgeon: Angelia Mould, MD;  Location: [facility];  Service: Vascular;  Laterality: Right;  . BASCILIC VEIN TRANSPOSITION Right 10/01/2019   Procedure: FIRST STAGE BASCILIC VEIN TRANSPOSITION;  Surgeon: Angelia Mould, MD;  Location: [facility];  Service: Vascular;  Laterality: Right;  . BASCILIC VEIN TRANSPOSITION Right 11/13/2019   Procedure: RIGHT SECOND STAGE BASCILIC VEIN TRANSPOSITION;  Surgeon: Rosetta Posner, MD;  Location: [facility];  Service: Vascular;  Laterality: Right;  . COLONOSCOPY    . cyst elbow  1999   right  . FEMORAL-POPLITEAL BYPASS GRAFT Right 07/11/2018   Procedure: BYPASS GRAFT FEMORAL-POPLITEAL ARTERY;  Surgeon: Angelia Mould, MD;  Location: [facility];  Service: Vascular;  Laterality: Right;  . FOOT SURGERY  both feet for something that was torn  . IR FLUORO GUIDE CV LINE RIGHT  11/08/2019  . IR US GUIDE VASC ACCESS RIGHT  11/08/2019  . LIGATION OF ARTERIOVENOUS  FISTULA Right 10/01/2019   Procedure: LIGATION OF RIGHT RADIOCEPHALIC  ARTERIOVENOUS  FISTULA;  Surgeon: Angelia Mould, MD;  Location: [facility];  Service: Vascular;  Laterality: Right;  . TOE AMPUTATION  2010   left 2nd toe  . UPPER GI ENDOSCOPY      No Known Allergies  Prior to Admission  medications   Medication Sig Start Date End Date Taking? Authorizing Provider  B Complex-C-Folic Acid (NEPHRO-VITE RX PO) Take 1 tablet by mouth at bedtime. 11/27/19  Yes [provider]  calcitRIOL (ROCALTROL) 0.25 MCG capsule Take 0.25 mcg by mouth every Monday, Wednesday, and Friday.  10/27/17  Yes [provider]  cyanocobalamin (,VITAMIN B-12,) 1000 MCG/ML injection Vitamin B12 injection: 1000 mcg (1 mL) by subcu injection once daily for 7 days; then once weekly for 4 weeks; then once monthly thereafter.  7 07/15/2010 Patient taking differently: Inject 1,000 mcg into the muscle every 30 (thirty) days. 12/21/18  Yes Ladell Pier, MD  folic acid (FOLVITE) 1 MG tablet TAKE 1 TABLET BY MOUTH EVERY DAY Patient taking differently: Take 1 mg by mouth daily. 09/06/19  Yes Ladell Pier, MD  insulin NPH Human (NOVOLIN N) 100 UNIT/ML injection Inject 0.5 mLs (50 Units total) into the skin daily before breakfast. Patient taking differently: Inject 50 Units into the skin daily as needed (high blood sugar). 11/14/19  Yes Vann, Jessica U, DO  melatonin 1 MG TABS tablet Take 3 mg by mouth at bedtime as needed (sleep).   Yes [provider]  midodrine (PROAMATINE) 10 MG tablet Take 10 mg by mouth 3 (three) times a week. Cain Saupe, Sat 11/22/19  Yes [provider]  pravastatin (PRAVACHOL) 40 MG tablet Take 40 mg by mouth at bedtime.   Yes [provider]    Social History   Socioeconomic History  . Marital status: Married    Spouse name: [name]  . Number of children: Not on file  . Years of education: Not on file  . Highest education level: Not on file  Occupational History  . Not on file  Tobacco Use  . Smoking status: Former Smoker    Types: Cigarettes    Quit date: 03/11/1996    Years since quitting: 23.9  . Smokeless tobacco: Never Used  Vaping Use  . Vaping Use: Never used  Substance and Sexual Activity  . Alcohol use: No  . Drug use: No   . Sexual activity: Not Currently  Other Topics Concern  . Not on file  Social History Narrative  . Not on file   Social Determinants of Health   Financial Resource Strain: Not on file  Food Insecurity: Not on file  Transportation Needs: Not on file  Physical Activity: Not on file  Stress: Not on file  Social Connections: Not on file  Intimate Partner Violence: Not on file     Family History  Problem Relation Age of Onset  . Colon cancer Neg Hx     ROS: [x]  Positive   [ ]  Negative   [ ]  All sytems reviewed and are negative  Cardiovascular: []  chest pain/pressure []  palpitations []  SOB lying flat []  DOE []  pain in legs while walking []  pain in legs at rest []  pain in legs at night []  non-healing ulcers []   hx of DVT []  swelling in legs  Pulmonary: []  productive cough []  asthma/wheezing []  home O2  Neurologic: []  weakness in []  arms []  legs []  numbness in []  arms []  legs []  hx of CVA []  mini stroke [] difficulty speaking or slurred speech []  temporary loss of vision in one eye []  dizziness  Hematologic: []  hx of cancer []  bleeding problems []  problems with blood clotting easily  Endocrine:   []  diabetes []  thyroid disease  GI []  vomiting blood []  blood in stool  GU: []  CKD/renal failure []  HD--[]  M/W/F or []  T/T/S []  burning with urination []  blood in urine  Psychiatric: []  anxiety []  depression  Musculoskeletal: []  arthritis []  joint pain  Integumentary: []  rashes []  ulcers  Constitutional: []  fever []  chills   Physical Examination  Vitals:   02/11/20 1500 02/11/20 1600  BP: (!) 126/58 119/66  Pulse: 91 88  Resp: (!) 22 16  Temp:    SpO2: 97% 95%   There is no height or weight on file to calculate BMI.  General:  NAD Gait: Not observed HENT: WNL, normocephalic Pulmonary: normal non-labored breathing, without Rales, rhonchi,  wheezing Cardiac: regular, without  Murmurs, rubs or gallops Abdomen: soft, NT/ND, no  masses Skin: without rashes Vascular Exam/Pulses: Femoral pulses palpable bilaterally Right great toe amp healed, gangrene toes 2-5, right PT monophasic signal Left toe amps 1-3 well healed - no active tissue loss Extremities: with ischemic changes right foot Musculoskeletal: no muscle wasting or atrophy  Neurologic: A&O X 3; Appropriate Affect ; SENSATION: normal; MOTOR FUNCTION:  moving all extremities equally. Speech is fluent/normal         CBC    Component Value Date/Time   WBC 13.1 (H) 02/11/2020 1203   RBC 4.51 02/11/2020 1203   HGB 11.9 (L) 02/11/2020 1203   HGB 12.5 (L) 01/30/2020 1059   HCT 40.3 02/11/2020 1203   PLT 139 (L) 02/11/2020 1203   PLT 102 (L) 01/30/2020 1059   MCV 89.4 02/11/2020 1203   MCH 26.4 02/11/2020 1203   MCHC 29.5 (L) 02/11/2020 1203   RDW 19.3 (H) 02/11/2020 1203   LYMPHSABS 8.0 (H) 02/10/2020 1612   MONOABS 1.2 (H) 02/10/2020 1612   EOSABS 0.0 02/10/2020 1612   BASOSABS 0.0 02/10/2020 1612    BMET    Component Value Date/Time   NA 136 02/11/2020 0235   K 4.5 02/11/2020 0235   CL 95 (L) 02/11/2020 0235   CO2 28 02/11/2020 0235   GLUCOSE 141 (H) 02/11/2020 0235   BUN 45 (H) 02/11/2020 0235   CREATININE 5.49 (H) 02/11/2020 1203   CREATININE 5.72 (HH) 12/28/2017 1009   CALCIUM 8.9 02/11/2020 0235   GFRNONAA 10 (L) 02/11/2020 1203   GFRNONAA 9 (L) 12/28/2017 1009   GFRAA 12 (L) 09/20/2018 1013   GFRAA 10 (L) 12/28/2017 1009    COAGS: Lab Results  Component Value Date   INR 1.4 (H) 11/08/2019   INR 1.2 07/11/2018   INR 1.18 08/24/2011     Non-Invasive Vascular Imaging:    ABIs pending   ASSESSMENT/PLAN: This is a 80 y.o. male with multiple medical problems including end-stage renal disease and peripheral vascular disease status post previous right common femoral to below-knee popliteal bypass with vein in 2020 by Dr. Scot Dock that vascular surgery has been consulted for new tissue loss in the right foot.  I can only get  a monophasic PT signal at the right ankle and I discussed that high risk his bypasses is  occluded.  I think he would benefit from lower extremity arteriogram possible intervention.  He is posted for tomorrow with Dr. Trula Slade.  This will be in the afternoon so we can keep his normal dialysis schedule in the morning.  Please keep n.p.o. after midnight.  Consent order placed in the chart.  Discussed goal is evaluate blood flow for limb salvage and would have to evaluate if he has a new bypass target or if he has any endovascular options. Risks and benefits discussed.  [name], MD [facility] Office: [place]

## 2020-02-11 NOTE — ED Notes (Signed)
Report called to Idamae Schuller, RN on 65M at [facility].

## 2020-02-12 ENCOUNTER — Other Ambulatory Visit: Payer: Self-pay

## 2020-02-12 ENCOUNTER — Inpatient Hospital Stay (HOSPITAL_COMMUNITY)
Admission: EM | Disposition: A | Discharge status: Skilled Nursing Facility | Payer: Self-pay | Source: Home / Self Care | Attending: Internal Medicine

## 2020-02-12 DIAGNOSIS — L03031 Cellulitis of right toe: Secondary | ICD-10-CM | POA: Diagnosis not present

## 2020-02-12 DIAGNOSIS — L02611 Cutaneous abscess of right foot: Secondary | ICD-10-CM | POA: Diagnosis not present

## 2020-02-12 DIAGNOSIS — I70261 Atherosclerosis of native arteries of extremities with gangrene, right leg: Secondary | ICD-10-CM | POA: Diagnosis not present

## 2020-02-12 HISTORY — PX: ABDOMINAL AORTOGRAM W/LOWER EXTREMITY: CATH118223

## 2020-02-12 HISTORY — PX: PERIPHERAL VASCULAR BALLOON ANGIOPLASTY: CATH118281

## 2020-02-12 HISTORY — PX: PERIPHERAL VASCULAR INTERVENTION: CATH118257

## 2020-02-12 LAB — CBC
HCT: 36 % — ABNORMAL LOW (ref 39.0–52.0)
Hemoglobin: 11.4 g/dL — ABNORMAL LOW (ref 13.0–17.0)
MCH: 27.3 pg (ref 26.0–34.0)
MCHC: 31.7 g/dL (ref 30.0–36.0)
MCV: 86.3 fL (ref 80.0–100.0)
Platelets: 129 10*3/uL — ABNORMAL LOW (ref 150–400)
RBC: 4.17 MIL/uL — ABNORMAL LOW (ref 4.22–5.81)
RDW: 19.4 % — ABNORMAL HIGH (ref 11.5–15.5)
WBC: 14.2 10*3/uL — ABNORMAL HIGH (ref 4.0–10.5)
nRBC: 0.1 % (ref 0.0–0.2)

## 2020-02-12 LAB — GLUCOSE, CAPILLARY
Glucose-Capillary: 141 mg/dL — ABNORMAL HIGH (ref 70–99)
Glucose-Capillary: 106 mg/dL — ABNORMAL HIGH (ref 70–99)
Glucose-Capillary: 128 mg/dL — ABNORMAL HIGH (ref 70–99)
Glucose-Capillary: 99 mg/dL (ref 70–99)

## 2020-02-12 LAB — RENAL FUNCTION PANEL
Albumin: 2.8 g/dL — ABNORMAL LOW (ref 3.5–5.0)
Anion gap: 15 (ref 5–15)
BUN: 59 mg/dL — ABNORMAL HIGH (ref 8–23)
CO2: 21 mmol/L — ABNORMAL LOW (ref 22–32)
Calcium: 8.7 mg/dL — ABNORMAL LOW (ref 8.9–10.3)
Chloride: 97 mmol/L — ABNORMAL LOW (ref 98–111)
Creatinine, Ser: 6.69 mg/dL — ABNORMAL HIGH (ref 0.61–1.24)
GFR, Estimated: 8 mL/min — ABNORMAL LOW (ref >60)
Glucose, Bld: 87 mg/dL (ref 70–99)
Phosphorus: 5.9 mg/dL — ABNORMAL HIGH (ref 2.5–4.6)
Potassium: 4 mmol/L (ref 3.5–5.1)
Sodium: 133 mmol/L — ABNORMAL LOW (ref 135–145)

## 2020-02-12 LAB — MRSA PCR SCREENING: MRSA by PCR: NEGATIVE

## 2020-02-12 LAB — POCT ACTIVATED CLOTTING TIME
Activated Clotting Time: 148 seconds
Activated Clotting Time: 291 seconds
Activated Clotting Time: 261 seconds
Activated Clotting Time: 255 seconds
Activated Clotting Time: 202 seconds
Activated Clotting Time: 154 seconds
Activated Clotting Time: 160 seconds

## 2020-02-12 SURGERY — ABDOMINAL AORTOGRAM W/LOWER EXTREMITY
Anesthesia: LOCAL | Laterality: Right

## 2020-02-12 MED ORDER — MIDAZOLAM HCL 2 MG/2ML IJ SOLN
INTRAMUSCULAR | Status: AC
  Filled 2020-02-12: qty 2

## 2020-02-12 MED ORDER — FENTANYL CITRATE (PF) 100 MCG/2ML IJ SOLN
INTRAMUSCULAR | Status: AC
  Filled 2020-02-12: qty 2

## 2020-02-12 MED ORDER — HEPARIN (PORCINE) IN NACL 1000-0.9 UT/500ML-% IV SOLN
INTRAVENOUS | Status: AC
  Filled 2020-02-12: qty 500

## 2020-02-12 MED ORDER — HEPARIN SODIUM (PORCINE) 1000 UNIT/ML IJ SOLN
INTRAMUSCULAR | Status: AC
  Filled 2020-02-12: qty 1

## 2020-02-12 MED ORDER — ATROPINE SULFATE 1 MG/10ML IJ SOSY
PREFILLED_SYRINGE | INTRAMUSCULAR | Status: AC
  Filled 2020-02-12: qty 10

## 2020-02-12 MED ORDER — CLOPIDOGREL BISULFATE 300 MG PO TABS
ORAL_TABLET | ORAL | Status: DC | PRN
  Administered 2020-02-12: 300 mg via ORAL

## 2020-02-12 MED ORDER — LIDOCAINE HCL (PF) 1 % IJ SOLN
INTRAMUSCULAR | Status: AC
  Filled 2020-02-12: qty 30

## 2020-02-12 MED ORDER — LIDOCAINE HCL (PF) 1 % IJ SOLN
INTRAMUSCULAR | Status: DC | PRN
  Administered 2020-02-12: 15 mL via INTRADERMAL

## 2020-02-12 MED ORDER — MIDODRINE HCL 5 MG PO TABS
10.0000 mg | ORAL_TABLET | ORAL | Status: DC
  Administered 2020-02-13 – 2020-02-19 (×3): 10 mg via ORAL
  Filled 2020-02-12 (×3): qty 2

## 2020-02-12 MED ORDER — HEPARIN SODIUM (PORCINE) 1000 UNIT/ML IJ SOLN
INTRAMUSCULAR | Status: DC | PRN
  Administered 2020-02-12: 9000 [IU] via INTRAVENOUS
  Administered 2020-02-12: 4000 [IU] via INTRAVENOUS

## 2020-02-12 MED ORDER — HEPARIN (PORCINE) IN NACL 1000-0.9 UT/500ML-% IV SOLN
INTRAVENOUS | Status: DC | PRN
  Administered 2020-02-12 (×2): 500 mL

## 2020-02-12 MED ORDER — IODIXANOL 320 MG/ML IV SOLN
INTRAVENOUS | Status: DC | PRN
  Administered 2020-02-12: 190 mg via INTRA_ARTERIAL

## 2020-02-12 MED ORDER — MIDAZOLAM HCL 2 MG/2ML IJ SOLN
INTRAMUSCULAR | Status: DC | PRN
  Administered 2020-02-12 (×4): 1 mg via INTRAVENOUS

## 2020-02-12 MED ORDER — CLOPIDOGREL BISULFATE 300 MG PO TABS
ORAL_TABLET | ORAL | Status: AC
  Filled 2020-02-12: qty 1

## 2020-02-12 MED ORDER — CHLORHEXIDINE GLUCONATE CLOTH 2 % EX PADS
6.0000 | MEDICATED_PAD | Freq: Every day | CUTANEOUS | Status: DC
  Administered 2020-02-13: 6 via TOPICAL

## 2020-02-12 MED ORDER — FENTANYL CITRATE (PF) 100 MCG/2ML IJ SOLN
INTRAMUSCULAR | Status: DC | PRN
  Administered 2020-02-12 (×3): 25 ug via INTRAVENOUS
  Administered 2020-02-12: 50 ug via INTRAVENOUS

## 2020-02-12 SURGICAL SUPPLY — 30 items
BAG SNAP BAND KOVER 36X36 (MISCELLANEOUS) ×1 IMPLANT
BALLN MUSTANG 4X150X135 (BALLOONS) ×3
BALLN STERLING OTW 3X100X150 (BALLOONS) ×3
BALLN STERLING OTW 4X150X150 (BALLOONS) ×3
BALLN STERLING OTW 5X220X150 (BALLOONS) ×3
BALLOON MUSTANG 4X150X135 (BALLOONS) IMPLANT
BALLOON STERLING OTW 3X100X150 (BALLOONS) IMPLANT
BALLOON STERLING OTW 4X150X150 (BALLOONS) IMPLANT
BALLOON STERLING OTW 5X220X150 (BALLOONS) IMPLANT
CATH NAVICROSS ST .035X135CM (MICROCATHETER) ×1 IMPLANT
CATH OMNI FLUSH 5F 65CM (CATHETERS) ×1 IMPLANT
CATH QUICKCROSS .035X135CM (MICROCATHETER) ×2 IMPLANT
COVER DOME SNAP 22 D (MISCELLANEOUS) ×2 IMPLANT
DEVICE TORQUE H2O (MISCELLANEOUS) ×1 IMPLANT
GLIDEWIRE ADV .035X260CM (WIRE) ×1 IMPLANT
GUIDEWIRE ANGLED .035X260CM (WIRE) ×1 IMPLANT
KIT ENCORE 26 ADVANTAGE (KITS) ×1 IMPLANT
KIT MICROPUNCTURE NIT STIFF (SHEATH) ×1 IMPLANT
KIT PV (KITS) ×3 IMPLANT
SHEATH PINNACLE 5F 10CM (SHEATH) ×1 IMPLANT
SHEATH PINNACLE ST 6F 45CM (SHEATH) ×1 IMPLANT
SHEATH PROBE COVER 6X72 (BAG) ×1 IMPLANT
STENT INNOVA 5X100X130 (Permanent Stent) ×1 IMPLANT
STENT VIABAHN 5X250X120 (Permanent Stent) ×2 IMPLANT
SYR MEDRAD MARK V 150ML (SYRINGE) ×1 IMPLANT
TRANSDUCER W/STOPCOCK (MISCELLANEOUS) ×3 IMPLANT
TRAY PV CATH (CUSTOM PROCEDURE TRAY) ×3 IMPLANT
WIRE G V18X300CM (WIRE) ×1 IMPLANT
WIRE HI TORQ VERSACORE J 260CM (WIRE) ×1 IMPLANT
WIRE STARTER BENTSON 035X150 (WIRE) ×1 IMPLANT

## 2020-02-12 NOTE — Op Note (Signed)
Patient name: Thomas Mcguire MRN: [id number] DOB: 05-Sep-1940 Sex: male  02/12/2020 Pre-operative Diagnosis: Right leg ulcers Post-operative diagnosis:  Same Surgeon:  Annamarie Major Procedure Performed:  1.  Ultrasound-guided access, left femoral artery  2.  Abdominal aortogram  3.  Right leg runoff  4.  Stent, right femoral-popliteal artery  5.  Stent, right tibioperoneal trunk  6.  Balloon angioplasty, right peroneal artery  7.  Conscious sedation, 138 minutes   Indications: The patient has previously undergone a right femoral to below-knee popliteal artery bypass graft with vein.  He has developed ischemic changes to his toes.  His bypass graft is presumed to be occluded.  He comes in today for angiography and possible intervention.  Procedure:  The patient was identified in the holding area and taken to room 8.  The patient was then placed supine on the table and prepped and draped in the usual sterile fashion.  A time out was called.  Conscious sedation was administered with the use of IV fentanyl and Versed under continuous physician and nurse monitoring.  Heart rate, blood pressure, and oxygen saturation were continuously monitored.  Total sedation time was 138 minutes.  Ultrasound was used to evaluate the left common femoral artery.  It was patent .  A digital ultrasound image was acquired.  A micropuncture needle was used to access the left common femoral artery under ultrasound guidance.  An 018 wire was advanced without resistance and a micropuncture sheath was placed.  The 018 wire was removed and a benson wire was placed.  The micropuncture sheath was exchanged for a 5 french sheath.  An omniflush catheter was advanced over the wire to the level of L-1.  An abdominal angiogram was obtained.  Next, using the omniflush catheter and a benson wire, the aortic bifurcation was crossed and the catheter was placed into theright external iliac artery and right runoff was obtained.     Findings:   Aortogram: No significant renal artery stenosis was identified.  No significant aortic stenosis identified.  No significant bilateral iliac stenosis was identified.  Right Lower Extremity: The right common femoral artery is widely patent.  The right profundofemoral artery is without stenosis.  The proximal portion of the superficial femoral artery is diseased but patent and then occludes in the mid thigh with reconstitution of the below-knee popliteal artery with single-vessel runoff.  The right femoral-popliteal bypass graft opacifies proximally but then occludes just beyond its origin.  Left Lower Extremity: Not evaluated due to procedural time and patient's difficulty to sit still  Intervention: After the above images were acquired the decision was made to proceed with intervention.  A 6 French 45 cm sheath was advanced over the aortic bifurcation and placed into the right external iliac artery.  The patient was fully heparinized.  I tried to recanalize the right superficial femoral artery.  I used a Glidewire advantage and a quick cross catheter.  With significant difficulty, I was ultimately able to get the catheter wire down into the distal thigh.  I struggled trying to pass the catheter further past the joint space.  I switched out to a glide catheter and tried multiple wires.  Ultimately, I was not ever able to reenter into the below-knee popliteal artery.  I had created a large dissection plane which precluded further attempts at recanalization.  Given that the patient had very little surgical options, I felt trying to recanalize his bypass graft was a reasonable option.  The wire was  then directed into the proximal bypass graft.  Using the Glidewire advantage and a quick cross catheter I was ultimately able to cross through the bypass graft and get wire access into the below-knee popliteal artery.  This was confirmed with a contrast injection.  I then tried to advance a 5 x 100  INNOVA stent to cross the distal anastomosis however this would not pass.  The stent was removed and then a 4 x 100 balloon was inserted.  Balloon angioplasty across the distal anastomosis was performed and then I reinserted the 5 x 100 stent.  My intent was to deploy the stent into the below-knee popliteal artery, however upon deployment the stent jumped forward into the tibioperoneal trunk.  I then elected to extend approximately it with overlapping 5 x 250 Viabahn stents.  2 were palced, with approximately 1 cm overlap and landing at the origin of the bypass graft.  The stents were then molded with a 5 mm balloon.  Follow-up imaging revealed a widely patent bypass graft.  There did appear to be occlusion of the peroneal artery at this time.  I then inserted a 3 x 100 Sterling balloon and perform balloon angioplasty of the peroneal artery.  After 2 inflations of the balloon in the peroneal artery a completion arteriogram was performed.  This showed inline flow through the bypass graft and into the peroneal artery which reconstitutes the posterior tibial across the ankle.  At this point, I elected to terminate the procedure.  The sheath was withdrawn and the patient be taken to the holding area for sheath pull.  Impression:  #1  Occluded right superficial femoral and right femoral-popliteal bypass graft  #2  Failed attempt at recanalization of the native right superficial femoral and popliteal artery  #3  Successful recanalization of the right femoral below-knee popliteal artery bypass graft.  A 5 x 100 INNOVA stent was placed beginning in the tibioperoneal trunk extending up into the bypass graft.  I then placed 2 overlapping 5x250 Viabahn stents within the vein graft.  Following the procedure, the patient had inline flow through the recanalized bypass graft and into the peroneal artery which perfuses the foot.    Theotis Burrow, M.D., [facility] [place] Office:  [phone] Pager:  [phone]

## 2020-02-12 NOTE — Progress Notes (Signed)
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  He was found to have gangrene of the second, third and fourth right toes.  On presentation he was hemodynamically stable.  X-rays of the foot showed no evidence of osteomyelitis but large linear metallic foreign body in the plantar soft tissue of the foot and on the mid first metatarsal.  Started on broad spectrum antibiotics.  Patient was transferred from [facility] to [facility] for  continuation of dialysis.  Vascular surgery planning for lower extremity arteriogram with possible intervention today.  Nephrology following for dialysis.  Assessment & Plan:   Principal Problem:   Cellulitis and abscess of toe of right foot Active Problems:   Chronic lymphocytic leukemia (CLL), B-cell (HCC)   PAD (peripheral artery disease) (HCC)   CKD (chronic kidney disease)   DM2 (diabetes mellitus, type 2) (HCC)   Cellulitis of right foot   Gangrene (HCC)   Gangrene of the right foot: Presented with discoloration, swelling of the right foot.  X-ray denies any evidence of osteomyelitis.  Vascular surgery following.  Plan for lower extremity angiogram with possible intervention today.  Continue aggressive antibiotics.  Follow-up cultures.  Currently is afebrile, hemodynamically stable.  Has mild leukocytosis.  Peripheral vascular disease:status post femoropopliteal bypass surgery in 2020, chronic ischemic ulcers of the foot status post amputation of the right big  toe.  We will continue his home medications on discharge.  ESRD on dialysis: Dialyzed on TTS schedule.  Nephrology following for dialysis  Diabetes type 2: Recent hemoglobin C of 6.1.  Monitor blood sugars.  Continue current regimen  History of CLL: Follows with Dr. Learta Codding.  Outpatient follow-up recommended  Thrombocytopenia: Continue to monitor.  Stable  Chronic hypotension: On midodrine  Hyperlipidemia: On Pravachol         DVT prophylaxis:Heparin Bracey Code Status: Full Family Communication: None at bedside Status is: Inpatient  Remains inpatient appropriate because:Inpatient level of care appropriate due to severity of illness   Dispo: The patient is from: Home              Anticipated d/c is to: Home              Anticipated d/c date is: 3 days              Patient currently is not medically stable to d/c.   Difficult to place patient No     Consultants: Vascular surgery, nephrology  Procedures: Plan for arteriogram, dialysis  Antimicrobials:  Anti-infectives (From admission, onward)   Start     Dose/Rate Route Frequency Ordered Stop   02/12/20 1200  vancomycin (VANCOCIN) IVPB 1000 mg/200 mL premix        1,000 mg 200 mL/hr over 60 Minutes Intravenous Every T-Th-Sa (Hemodialysis) 02/10/20 1904     02/11/20 0000  ceFEPIme (MAXIPIME) 1 g in sodium chloride 0.9 % 100 mL IVPB        1 g 200 mL/hr over 30 Minutes Intravenous Daily-1800 02/10/20 1904     02/10/20 1630  vancomycin (  VANCOREADY) IVPB 2000 mg/400 mL        2,000 mg 200 mL/hr over 120 Minutes Intravenous STAT 02/10/20 1619 02/10/20 1929   02/10/20 1630  piperacillin-tazobactam (ZOSYN) IVPB 3.375 g        3.375 g 100 mL/hr over 30 Minutes Intravenous STAT 02/10/20 1619 02/10/20 1707      Subjective:  Patient seen and examined the bedside this morning.  Comfortable.  Pain well controlled.  Waiting for dialysis and vascular intervention today.  Denies any complaints  Objective: Vitals:   02/11/20  1600 02/11/20 1900 02/11/20 2052 02/12/20 0428  BP: 119/66 120/68 123/63 (!) 130/56  Pulse: 88 86 84 84  Resp: 16 17 18 18   Temp:  98.4 F (36.9 C) 97.8 F (36.6 C) 97.6 F (36.4 C)  TempSrc:  Oral Oral Oral  SpO2: 95% 95% 96% 97%    Intake/Output Summary (Last 24 hours) at 02/12/2020 0825 Last data filed at 02/12/2020 0600 Gross per 24 hour  Intake 460 ml  Output -  Net 460 ml   There were no vitals filed for this visit.  Examination:  General exam: Very pleasant elderly gentleman  HEENT:PERRL,Oral mucosa moist, Ear/Nose normal on gross exam Respiratory system: Bilateral equal air entry, normal vesicular breath sounds, no wheezes or crackles  Cardiovascular system: S1 & S2 heard, RRR. No JVD, murmurs, rubs, gallops or clicks..  Dialysis catheter on the right chest Gastrointestinal system: Abdomen is nondistended, soft and nontender. No organomegaly or masses felt. Normal bowel sounds heard. Central nervous system: Alert and oriented. No focal neurological deficits. Extremities: Blackish discoloration of the toes of the right foot, amputation of the big toe of the right foot  skin: No rashes,no icterus ,no pallor      Data Reviewed: I have personally reviewed following labs and imaging studies  CBC: Recent Labs  Lab 02/10/20 1612 02/11/20 0235 02/11/20 1203  WBC 13.8* 12.8* 13.1*  NEUTROABS 4.5  --   --   HGB 12.0* 11.7* 11.9*  HCT 40.9 39.1 40.3  MCV 90.1 90.3 89.4  PLT 138* 127* 062*   Basic Metabolic Panel: Recent Labs  Lab 02/10/20 1612 02/11/20 0235 02/11/20 1203  NA 139 136  --   K 3.8 4.5  --   CL 94* 95*  --   CO2 32 28  --   GLUCOSE 171* 141*  --   BUN 45* 45*  --   CREATININE 5.13* 5.25* 5.49*  CALCIUM 9.3 8.9  --    GFR: Estimated Creatinine Clearance: 13.7 mL/min (A) (by C-G formula based on SCr of 5.49 mg/dL (H)). Liver Function Tests: Recent Labs  Lab 02/11/20 0235  AST 32  ALT 33  ALKPHOS 56  BILITOT 1.4*  PROT 6.0*  ALBUMIN 3.3*    No results for input(s): LIPASE, AMYLASE in the last 168 hours. No results for input(s): AMMONIA in the last 168 hours. Coagulation Profile: No results for input(s): INR, PROTIME in the last 168 hours. Cardiac Enzymes: No results for input(s): CKTOTAL, CKMB, CKMBINDEX, TROPONINI in the last 168 hours. BNP (last 3 results) No results for input(s): PROBNP in the last 8760 hours. HbA1C: Recent Labs    02/11/20 0218  HGBA1C 6.1*   CBG: Recent Labs  Lab 02/11/20 0804 02/11/20 1213 02/11/20 1808 02/11/20 2049 02/12/20 0744  GLUCAP 112* 161* 181* 141* 106*   Lipid Profile: No results for input(s): CHOL, HDL, LDLCALC, TRIG, CHOLHDL, LDLDIRECT in the last 72 hours. Thyroid Function Tests: No results for  input(s): TSH, T4TOTAL, FREET4, T3FREE, THYROIDAB in the last 72 hours. Anemia Panel: No results for input(s): VITAMINB12, FOLATE, FERRITIN, TIBC, IRON, RETICCTPCT in the last 72 hours. Sepsis Labs: No results for input(s): PROCALCITON, LATICACIDVEN in the last 168 hours.  Recent Results (from the past 240 hour(s))  SARS Coronavirus 2 by RT PCR (hospital order, performed in [place] hospital lab)     Status: None   Collection Time: 02/10/20  7:00 PM  Result Value Ref Range Status   SARS Coronavirus 2 NEGATIVE NEGATIVE Final    Comment: (NOTE) SARS-CoV-2 target nucleic acids are NOT DETECTED.  The SARS-CoV-2 RNA is generally detectable in upper and lower respiratory specimens during the acute phase of infection. The lowest concentration of SARS-CoV-2 viral copies this assay can detect is 250 copies / mL. A negative result does not preclude SARS-CoV-2 infection and should not be used as the sole basis for treatment or other patient management decisions.  A negative result may occur with improper specimen collection / handling, submission of specimen other than nasopharyngeal swab, presence of viral mutation(s) within the areas targeted by this assay, and inadequate number  of viral copies (<250 copies / mL). A negative result must be combined with clinical observations, patient history, and epidemiological information.  Fact Sheet for Patients:   StrictlyIdeas.no  Fact Sheet for Healthcare Providers: BankingDealers.co.za  This test is not yet approved or  cleared by the [place] FDA and has been authorized for detection and/or diagnosis of SARS-CoV-2 by FDA under an Emergency Use Authorization (EUA).  This EUA will remain in effect (meaning this test can be used) for the duration of the COVID-19 declaration under Section 564(b)(1) of the Act, 21 U.S.C. section 360bbb-3(b)(1), unless the authorization is terminated or revoked sooner.  Performed at [facility], [place] [address] [place], [place] [address]   MRSA PCR Screening     Status: None   Collection Time: 02/11/20 11:07 PM   Specimen: Nasopharyngeal  Result Value Ref Range Status   MRSA by PCR NEGATIVE NEGATIVE Final    Comment:        The GeneXpert MRSA Assay (FDA approved for NASAL specimens only), is one component of a comprehensive MRSA colonization surveillance program. It is not intended to diagnose MRSA infection nor to guide or monitor treatment for MRSA infections. Performed at [facility], [place] [address], [place], [place] [address]          Radiology Studies: DG Foot Complete Right  Result Date: 02/10/2020 CLINICAL DATA:  Osteomyelitis. 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 There are advanced vascular calcifications. There are degenerative changes of the midfoot. There is a large linear metallic foreign body in the plantar soft tissues of the foot at the level of the mid first metatarsal. There is a moderate-sized plantar calcaneal spur. There is soft tissue swelling about the forefoot. No definite acute displaced fracture  or dislocation. No evidence for osteomyelitis. IMPRESSION: 1. No evidence for osteomyelitis. 2. Large linear metallic foreign body in the plantar soft tissues of the foot at the level of the mid first metatarsal. 3. Soft tissue swelling about the forefoot. No acute displaced fracture or dislocation. Electronically Signed   By: Constance Holster M.D.   On: 02/10/2020 17:42        Scheduled Meds: . calcitRIOL  0.25 mcg Oral Q M,W,F  . Chlorhexidine Gluconate Cloth  6  each Topical Daily  . cyanocobalamin  1,000 mcg Intramuscular Q30 days  . folic acid  1 mg Oral Daily  . heparin  5,000 Units Subcutaneous Q8H  . insulin aspart  0-15 Units Subcutaneous TID WC  . insulin aspart  0-5 Units Subcutaneous QHS  . midodrine  10 mg Oral Once per day on Mon Wed Fri  . multivitamin  1 tablet Oral QHS  . pravastatin  40 mg Oral QHS   Continuous Infusions: . ceFEPime (MAXIPIME) IV 1 g (02/11/20 2159)  . vancomycin       LOS: 2 days    Time spent: 25 mins.More than 50% of that time was spent in counseling and/or coordination of care.      Shelly Coss, MD [facility] P2/01/2020, 8:25 AM

## 2020-02-12 NOTE — Interval H&P Note (Signed)
History and Physical Interval Note:  02/12/2020 2:03 PM  Thomas Mcguire  has presented today for surgery, with the diagnosis of claudication.  The various methods of treatment have been discussed with the patient and family. After consideration of risks, benefits and other options for treatment, the patient has consented to  Procedure(s): ABDOMINAL AORTOGRAM W/LOWER EXTREMITY (N/A) as a surgical intervention.  The patient's history has been reviewed, patient examined, no change in status, stable for surgery.  I have reviewed the patient's chart and labs.  Questions were answered to the patient's satisfaction.     Annamarie Major

## 2020-02-12 NOTE — H&P (View-Only) (Signed)
[facility]  Subjective  - no new complaints.   Objective (!) 130/56 84 97.6 F (36.4 C) (Oral) 18 97%  Intake/Output Summary (Last 24 hours) at 02/12/2020 0824 Last data filed at 02/12/2020 0600 Gross per 24 hour  Intake 460 ml  Output -  Net 460 ml        Laboratory Lab Results: Recent Labs    02/11/20 0235 02/11/20 1203  WBC 12.8* 13.1*  HGB 11.7* 11.9*  HCT 39.1 40.3  PLT 127* 139*   BMET Recent Labs    02/10/20 1612 02/11/20 0235 02/11/20 1203  NA 139 136  --   K 3.8 4.5  --   CL 94* 95*  --   CO2 32 28  --   GLUCOSE 171* 141*  --   BUN 45* 45*  --   CREATININE 5.13* 5.25* 5.49*  CALCIUM 9.3 8.9  --     COAG Lab Results  Component Value Date   INR 1.4 (H) 11/08/2019   INR 1.2 07/11/2018   INR 1.18 08/24/2011   No results found for: PTT  Assessment/Planning:  Schedule for aortogram/right lower extremity arteriogram possible intervention with Dr. Trula Slade this afternoon.  I suspect his previous right femoral to below-knee popliteal bypass may be occluded.  We will need to evaluate if he has any endovascular options or additional bypass target.  Please keep n.p.o.  Risks and benefits discussed.  [name] 02/12/2020 8:24 AM --

## 2020-02-12 NOTE — Consult Note (Addendum)
[facility] Renal Consultation Note    Indication for Consultation:  Management of ESRD/hemodialysis, anemia, hypertension/volume, and secondary hyperparathyroidism.  HPI: Thomas Mcguire is a 80 y.o. 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  X-ray was negative for osteomyelitis but foot appeared to have surrounding cellulitis and patient was started on antibiotics. VVS was consulted and recommended arteriogram with possible intervention. He was transferred to [facility] for surgery and continuation of dialysis.  Patient reports he goes to dialysis TTS in [place]. He has and AVF but reports he needs a second surgery and has been using his TDC. Denies any recent complications with dialysis otherwise. Denies SOB, CP, palpitations, abdominal pain, nausea, and vomiting. Afebrile. Labs notable for K+ 4.5, BUN 45. Cr 5.25, WBC 13.2, and Hgb 11.9. His last dialysis was 02/09/20 and he left 0.4kg above his EDW of 85kg.   Past Medical History:  Diagnosis Date  . Anemia    low iron  . Arthritis   . Chronic renal insufficiency    CHECKED Q3MOS PER PT. Stage 4 as of 05/22/2018 per pt.  . Diabetes mellitus    type 2  . ESRD (end stage renal disease) ([place])   . Gout    Has not had recently- 08/24/11  . Hyperlipidemia   . Hypertension   . 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 PAD (peripheral artery disease) ([place])   . Wears dentures    Past Surgical History:  Procedure Laterality Date  . A/V FISTULAGRAM N/A 08/11/2018   Procedure: A/V FISTULAGRAM - Right Arm;  Surgeon: Angelia Mould, MD;  Location: [place] [facility];  Service: Cardiovascular;  Laterality: N/A;  . A/V FISTULAGRAM N/A 09/07/2019   Procedure: A/V FISTULAGRAM - Right AVF;  Surgeon: Angelia Mould, MD;  Location: [place] [facility];  Service: Cardiovascular;  Laterality: N/A;  .  ABDOMINAL AORTOGRAM W/LOWER EXTREMITY Right 07/03/2018   Procedure: ABDOMINAL AORTOGRAM W/LOWER EXTREMITY;  Surgeon: Waynetta Sandy, MD;  Location: [place] [facility];  Service: Cardiovascular;  Laterality: Right;  . AMPUTATION  08/27/2011   Procedure: AMPUTATION DIGIT;  Surgeon: Newt Minion, MD;  Location: [place];  Service: Orthopedics;  Laterality: Left;  Left Foot 3rd toe Amputation MTP joint  . AMPUTATION Right 07/11/2018   Procedure: AMPUTATION RIGHT GREAT TOE;  Surgeon: Angelia Mould, MD;  Location: [place];  Service: Vascular;  Laterality: Right;  . amputation great toe  2009   left  . AV FISTULA PLACEMENT Right 05/23/2018   Procedure: RADIAL- CEPHALIC ARTERIOVENOUS (AV) FISTULA CREATION RIGHT ARM AND LIGATION OF COMPETING BRANCH.;  Surgeon: Angelia Mould, MD;  Location: [place];  Service: Vascular;  Laterality: Right;  . BASCILIC VEIN TRANSPOSITION Right 10/01/2019   Procedure: FIRST STAGE BASCILIC VEIN TRANSPOSITION;  Surgeon: Angelia Mould, MD;  Location: [place];  Service: Vascular;  Laterality: Right;  . BASCILIC VEIN TRANSPOSITION Right 11/13/2019   Procedure: RIGHT SECOND STAGE BASCILIC VEIN TRANSPOSITION;  Surgeon: Rosetta Posner, MD;  Location: [place];  Service: Vascular;  Laterality: Right;  . COLONOSCOPY    . cyst elbow  1999   right  . FEMORAL-POPLITEAL BYPASS GRAFT Right 07/11/2018   Procedure: BYPASS GRAFT FEMORAL-POPLITEAL ARTERY;  Surgeon: Angelia Mould, MD;  Location: [place];  Service: Vascular;  Laterality: Right;  . FOOT SURGERY  both feet for something that was torn  . IR FLUORO GUIDE CV LINE RIGHT  11/08/2019  . IR US GUIDE VASC ACCESS RIGHT  11/08/2019  . LIGATION OF ARTERIOVENOUS  FISTULA Right 10/01/2019   Procedure: LIGATION OF RIGHT RADIOCEPHALIC  ARTERIOVENOUS  FISTULA;  Surgeon: Angelia Mould, MD;  Location: [place];  Service: Vascular;  Laterality: Right;  . TOE AMPUTATION  2010   left 2nd toe  . UPPER GI  ENDOSCOPY     Family History  Problem Relation Age of Onset  . Colon cancer Neg Hx    Social History:  reports that he quit smoking about 23 years ago. His smoking use included cigarettes. He has never used smokeless tobacco. He reports that he does not drink alcohol and does not use drugs.  ROS: As per HPI otherwise negative.  Physical Exam: Vitals:   02/11/20 1900 02/11/20 2052 02/12/20 0428 02/12/20 0955  BP: 120/68 123/63 (!) 130/56 126/62  Pulse: 86 84 84 85  Resp: 17 18 18 18   Temp: 98.4 F (36.9 C) 97.8 F (36.6 C) 97.6 F (36.4 C) 97.8 F (36.6 C)  TempSrc: Oral Oral Oral Oral  SpO2: 95% 96% 97% 98%     General: Well developed, well nourished, in no acute distress. Head: Normocephalic, atraumatic, sclera non-icteric, mucus membranes are moist. Neck: Supple without lymphadenopathy/masses. JVD not elevated. Lungs: Clear bilaterally to auscultation without wheezes, rales, or rhonchi. Breathing is unlabored. Heart: RRR with normal S1, S2. No murmurs, rubs, or gallops appreciated. Abdomen: Soft, non-tender, non-distended with normoactive bowel sounds. No rebound/guarding. No obvious abdominal masses. Musculoskeletal:  Strength and tone appear normal for age. Lower extremities: No edema or ischemic changes, no open wounds. Neuro: Alert and oriented X 3. Moves all extremities spontaneously. Psych:  Responds to questions appropriately with a normal affect. Dialysis Access:  No Known Allergies Prior to Admission medications   Medication Sig Start Date End Date Taking? Authorizing Provider  B Complex-C-Folic Acid (NEPHRO-VITE RX PO) Take 1 tablet by mouth at bedtime. 11/27/19  Yes [provider]  calcitRIOL (ROCALTROL) 0.25 MCG capsule Take 0.25 mcg by mouth every Monday, Wednesday, and Friday.  10/27/17  Yes [provider]  cyanocobalamin (,VITAMIN B-12,) 1000 MCG/ML injection Vitamin B12 injection: 1000 mcg (1 mL) by subcu injection once daily for 7 days;  then once weekly for 4 weeks; then once monthly thereafter.  7 07/15/2010 Patient taking differently: Inject 1,000 mcg into the muscle every 30 (thirty) days. 12/21/18  Yes Ladell Pier, MD  folic acid (FOLVITE) 1 MG tablet TAKE 1 TABLET BY MOUTH EVERY DAY Patient taking differently: Take 1 mg by mouth daily. 09/06/19  Yes Ladell Pier, MD  insulin NPH Human (NOVOLIN N) 100 UNIT/ML injection Inject 0.5 mLs (50 Units total) into the skin daily before breakfast. Patient taking differently: Inject 50 Units into the skin daily as needed (high blood sugar). 11/14/19  Yes Vann, Jessica U, DO  melatonin 1 MG TABS tablet Take 3 mg by mouth at bedtime as needed (sleep).   Yes [provider]  midodrine (PROAMATINE) 10 MG tablet Take 10 mg by mouth 3 (three) times a week. Cain Saupe, Sat 11/22/19  Yes [provider]  pravastatin (PRAVACHOL) 40 MG tablet Take 40 mg by mouth at bedtime.   Yes [provider]   Current Facility-Administered Medications  Medication Dose Route Frequency Provider Last Rate Last Admin  . acetaminophen (TYLENOL) tablet 650 mg  650 mg Oral Q6H  PRN Elwyn Reach, MD       Or  . acetaminophen (TYLENOL) suppository 650 mg  650 mg Rectal Q6H PRN Gala Romney L, MD      . calcitRIOL (ROCALTROL) capsule 0.25 mcg  0.25 mcg Oral Q M,W,F Gala Romney L, MD   0.25 mcg at 02/11/20 1157  . ceFEPIme (MAXIPIME) 1 g in sodium chloride 0.9 % 100 mL IVPB  1 g Intravenous q1800 Gala Romney L, MD 200 mL/hr at 02/11/20 2159 1 g at 02/11/20 2159  . Chlorhexidine Gluconate Cloth 2 % PADS 6 each  6 each Topical Daily Donato Heinz, MD   6 each at 02/11/20 2159  . cyanocobalamin ((VITAMIN B-12)) injection 1,000 mcg  1,000 mcg Intramuscular Q30 days Elwyn Reach, MD   1,000 mcg at 02/11/20 1158  . folic acid (FOLVITE) tablet 1 mg  1 mg Oral Daily Gala Romney L, MD   1 mg at 02/11/20 1156  . heparin injection 5,000 Units  5,000 Units Subcutaneous  Q8H Georgette Shell, MD   5,000 Units at 02/12/20 0550  . insulin aspart (novoLOG) injection 0-15 Units  0-15 Units Subcutaneous TID WC Elwyn Reach, MD   3 Units at 02/11/20 1812  . insulin aspart (novoLOG) injection 0-5 Units  0-5 Units Subcutaneous QHS Garba, Mohammad L, MD      . insulin NPH Human (NOVOLIN N) injection 50 Units  50 Units Subcutaneous Daily PRN Gala Romney L, MD      . midodrine (PROAMATINE) tablet 10 mg  10 mg Oral Once per day on Mon Wed Fri Garba, Mohammad L, MD   10 mg at 02/11/20 1418  . morphine 2 MG/ML injection 2 mg  2 mg Intravenous Q2H PRN Elwyn Reach, MD   2 mg at 02/12/20 0805  . multivitamin (RENA-VIT) tablet 1 tablet  1 tablet Oral QHS Elwyn Reach, MD   1 tablet at 02/11/20 2152  . ondansetron (ZOFRAN) tablet 4 mg  4 mg Oral Q6H PRN Elwyn Reach, MD       Or  . ondansetron (ZOFRAN) injection 4 mg  4 mg Intravenous Q6H PRN Gala Romney L, MD      . pravastatin (PRAVACHOL) tablet 40 mg  40 mg Oral QHS Elwyn Reach, MD   40 mg at 02/11/20 2153  . traZODone (DESYREL) tablet 50 mg  50 mg Oral QHS PRN Vernelle Emerald, MD   50 mg at 02/11/20 2154  . vancomycin (VANCOCIN) IVPB 1000 mg/200 mL premix  1,000 mg Intravenous Q T,Th,Sa-HD Elwyn Reach, MD       Labs: Basic Metabolic Panel: Recent Labs  Lab 02/10/20 1612 02/11/20 0235 02/11/20 1203  NA 139 136  --   K 3.8 4.5  --   CL 94* 95*  --   CO2 32 28  --   GLUCOSE 171* 141*  --   BUN 45* 45*  --   CREATININE 5.13* 5.25* 5.49*  CALCIUM 9.3 8.9  --    Liver Function Tests: Recent Labs  Lab 02/11/20 0235  AST 32  ALT 33  ALKPHOS 56  BILITOT 1.4*  PROT 6.0*  ALBUMIN 3.3*   CBC: Recent Labs  Lab 02/10/20 1612 02/11/20 0235 02/11/20 1203  WBC 13.8* 12.8* 13.1*  NEUTROABS 4.5  --   --   HGB 12.0* 11.7* 11.9*  HCT 40.9 39.1 40.3  MCV 90.1 90.3 89.4  PLT 138* 127* 139*   CBG: Recent Labs  Lab 02/11/20 0804 02/11/20 1213 02/11/20 1808  02/11/20 2049 02/12/20 0744  GLUCAP 112* 161* 181* 141* 106*   Iron Studies: No results for input(s): IRON, TIBC, TRANSFERRIN, FERRITIN in the last 72 hours. Studies/Results: DG Foot Complete Right  Result Date: 02/10/2020 CLINICAL DATA:  Osteomyelitis. 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 There are advanced vascular calcifications. There are degenerative changes of the midfoot. There is a large linear metallic foreign body in the plantar soft tissues of the foot at the level of the mid first metatarsal. There is a moderate-sized plantar calcaneal spur. There is soft tissue swelling about the forefoot. No definite acute displaced fracture or dislocation. No evidence for osteomyelitis. IMPRESSION: 1. No evidence for osteomyelitis. 2. Large linear metallic foreign body in the plantar soft tissues of the foot at the level of the mid first metatarsal. 3. Soft tissue swelling about the forefoot. No acute displaced fracture or dislocation. Electronically Signed   By: Constance Holster M.D.   On: 02/10/2020 17:42    Dialysis Orders:  Center: [place]  on TTS. 180NRe, 4 hours, BFR 400, DFR 800, EDW 85kg, 2K/2.5Ca, TDC, no heparin No ESA Calcitriol 0.75 mcg PO q HD  Assessment/Plan: 1.  Gangrene of R foot/PAD: VVS following, planning for arteriogram today. Antibiotics per primary team.  2.  ESRD:  Continue TTS schedule. Will plan for HD today. No symptoms of uremia or volume overload. Dialysis treatments are currently shortened due to high census/nursing shortage. 3.  Hypotension/volume: BP controlled, no edema on exam. Continue midodrine prior to HD. UF 2L with HD as tolerated 4.  Anemia: Hgb at goal. No ESA indicated at this time.  5.  Metabolic bone disease: Calcium controlled. Continue calcitriol and resume home binders once eating 6.  Nutrition:  Currently NPO 7. T2DM: Mgt per primary team 8. Thrombocytopenia:  Stable, no heparin with HD  Anice Paganini, PA-C 02/12/2020, 10:19 AM  [facility] Pager: [phone]  I have seen and examined this patient and agree with plan and assessment in the above note with renal recommendations/intervention highlighted.  For angiogram today to establish any possible endovascular options or additional bypass target.  Plan for HD after procedure today. Broadus John A Easter [place],MD 02/12/2020 11:11 AM

## 2020-02-12 NOTE — Progress Notes (Signed)
[facility]  Subjective  - no new complaints.   Objective (!) 130/56 84 97.6 F (36.4 C) (Oral) 18 97%  Intake/Output Summary (Last 24 hours) at 02/12/2020 0824 Last data filed at 02/12/2020 0600 Gross per 24 hour  Intake 460 ml  Output --  Net 460 ml        Laboratory Lab Results: Recent Labs    02/11/20 0235 02/11/20 1203  WBC 12.8* 13.1*  HGB 11.7* 11.9*  HCT 39.1 40.3  PLT 127* 139*   BMET Recent Labs    02/10/20 1612 02/11/20 0235 02/11/20 1203  NA 139 136  --   K 3.8 4.5  --   CL 94* 95*  --   CO2 32 28  --   GLUCOSE 171* 141*  --   BUN 45* 45*  --   CREATININE 5.13* 5.25* 5.49*  CALCIUM 9.3 8.9  --     COAG Lab Results  Component Value Date   INR 1.4 (H) 11/08/2019   INR 1.2 07/11/2018   INR 1.18 08/24/2011   No results found for: PTT  Assessment/Planning:  Schedule for aortogram/right lower extremity arteriogram possible intervention with Dr. Trula Slade this afternoon.  I suspect his previous right femoral to below-knee popliteal bypass may be occluded.  We will need to evaluate if he has any endovascular options or additional bypass target.  Please keep n.p.o.  Risks and benefits discussed.  [name] 02/12/2020 8:24 AM --

## 2020-02-13 ENCOUNTER — Encounter (HOSPITAL_COMMUNITY): Payer: Self-pay | Admitting: Surgery

## 2020-02-13 DIAGNOSIS — I96 Gangrene, not elsewhere classified: Secondary | ICD-10-CM

## 2020-02-13 DIAGNOSIS — L02611 Cutaneous abscess of right foot: Secondary | ICD-10-CM | POA: Diagnosis not present

## 2020-02-13 DIAGNOSIS — Z992 Dependence on renal dialysis: Secondary | ICD-10-CM

## 2020-02-13 DIAGNOSIS — L03031 Cellulitis of right toe: Secondary | ICD-10-CM | POA: Diagnosis not present

## 2020-02-13 DIAGNOSIS — I739 Peripheral vascular disease, unspecified: Secondary | ICD-10-CM

## 2020-02-13 DIAGNOSIS — N186 End stage renal disease: Secondary | ICD-10-CM

## 2020-02-13 LAB — CBC WITH DIFFERENTIAL/PLATELET
Abs Immature Granulocytes: 0.07 10*3/uL (ref 0.00–0.07)
Basophils Absolute: 0 10*3/uL (ref 0.0–0.1)
Basophils Relative: 0 %
Eosinophils Absolute: 0 10*3/uL (ref 0.0–0.5)
Eosinophils Relative: 0 %
HCT: 36.4 % — ABNORMAL LOW (ref 39.0–52.0)
Hemoglobin: 10.9 g/dL — ABNORMAL LOW (ref 13.0–17.0)
Immature Granulocytes: 1 %
Lymphocytes Relative: 58 %
Lymphs Abs: 7.9 10*3/uL — ABNORMAL HIGH (ref 0.7–4.0)
MCH: 26.5 pg (ref 26.0–34.0)
MCHC: 29.9 g/dL — ABNORMAL LOW (ref 30.0–36.0)
MCV: 88.6 fL (ref 80.0–100.0)
Monocytes Absolute: 1.6 10*3/uL — ABNORMAL HIGH (ref 0.1–1.0)
Monocytes Relative: 12 %
Neutro Abs: 4 10*3/uL (ref 1.7–7.7)
Neutrophils Relative %: 29 %
Platelets: 124 10*3/uL — ABNORMAL LOW (ref 150–400)
RBC: 4.11 MIL/uL — ABNORMAL LOW (ref 4.22–5.81)
RDW: 19.1 % — ABNORMAL HIGH (ref 11.5–15.5)
WBC: 13.7 10*3/uL — ABNORMAL HIGH (ref 4.0–10.5)
nRBC: 0 % (ref 0.0–0.2)

## 2020-02-13 LAB — BASIC METABOLIC PANEL
Anion gap: 17 — ABNORMAL HIGH (ref 5–15)
BUN: 61 mg/dL — ABNORMAL HIGH (ref 8–23)
CO2: 23 mmol/L (ref 22–32)
Calcium: 9 mg/dL (ref 8.9–10.3)
Chloride: 96 mmol/L — ABNORMAL LOW (ref 98–111)
Creatinine, Ser: 6.81 mg/dL — ABNORMAL HIGH (ref 0.61–1.24)
GFR, Estimated: 8 mL/min — ABNORMAL LOW (ref >60)
Glucose, Bld: 137 mg/dL — ABNORMAL HIGH (ref 70–99)
Potassium: 3.8 mmol/L (ref 3.5–5.1)
Sodium: 136 mmol/L (ref 135–145)

## 2020-02-13 LAB — GLUCOSE, CAPILLARY
Glucose-Capillary: 113 mg/dL — ABNORMAL HIGH (ref 70–99)
Glucose-Capillary: 133 mg/dL — ABNORMAL HIGH (ref 70–99)
Glucose-Capillary: 80 mg/dL (ref 70–99)

## 2020-02-13 MED ORDER — VANCOMYCIN HCL 500 MG/100ML IV SOLN
500.0000 mg | Freq: Once | INTRAVENOUS | Status: AC
  Administered 2020-02-13: 500 mg via INTRAVENOUS
  Filled 2020-02-13 (×2): qty 100

## 2020-02-13 MED ORDER — HEPARIN SODIUM (PORCINE) 1000 UNIT/ML IJ SOLN
INTRAMUSCULAR | Status: AC
  Filled 2020-02-13: qty 4

## 2020-02-13 MED ORDER — VANCOMYCIN HCL 500 MG/100ML IV SOLN
500.0000 mg | INTRAVENOUS | Status: AC
  Administered 2020-02-14: 500 mg via INTRAVENOUS
  Filled 2020-02-13 (×2): qty 100

## 2020-02-13 MED ORDER — CHLORHEXIDINE GLUCONATE CLOTH 2 % EX PADS
6.0000 | MEDICATED_PAD | Freq: Every day | CUTANEOUS | Status: DC
  Administered 2020-02-14 – 2020-02-20 (×5): 6 via TOPICAL

## 2020-02-13 MED FILL — Heparin Sodium (Porcine) Inj 1000 Unit/ML: INTRAMUSCULAR | Qty: 10 | Status: AC

## 2020-02-13 NOTE — Progress Notes (Signed)
Site area: left groin  Site Prior to Removal:  Level 0  Pressure Applied For 20 MINUTES    Minutes Beginning at 2342  Manual:   Yes.    Patient Status During Pull:  AAO X4  Post Pull Groin Site:  Level 0  Post Pull Instructions Given:  Yes.    Post Pull Pulses Present:  Yes.    Dressing Applied:  Yes.    Comments:  Pt.tolerated well

## 2020-02-13 NOTE — Progress Notes (Addendum)
[facility] Progress Note   Subjective:   Vascular surgery was late last night for HD postponed to this AM. Pt reports the surgery went well and his foot is now warm. Reports mild SOB this AM. Denies CP, nausea and vomiting. O2 applied for comfort.   Objective Vitals:   02/13/20 0131 02/13/20 0258 02/13/20 0358 02/13/20 0515  BP: (!) 118/56 114/60 (!) 120/58 118/60  Pulse: 87 88 88 86  Resp: 17 20 15 18   Temp:  97.8 F (36.6 C)    TempSrc:  Oral    SpO2: 92% 94% 93% 93%  Weight:      Height:       Physical Exam General: Well developed male, alert and in NAD Heart: RRR, no mumurs, rubs or gallops Lungs: CTA bilaterally without wheezing, rhonchi or rales Abdomen: Soft, non-tender, non-distended, +BS Extremities: Trace edema b/l lower extremities Dialysis Access:  [facility] accessed  Additional Objective Labs: Basic Metabolic Panel: Recent Labs  Lab 02/11/20 0235 02/11/20 1203 02/12/20 2243 02/13/20 0249  NA 136  --  133* 136  K 4.5  --  4.0 3.8  CL 95*  --  97* 96*  CO2 28  --  21* 23  GLUCOSE 141*  --  87 137*  BUN 45*  --  59* 61*  CREATININE 5.25* 5.49* 6.69* 6.81*  CALCIUM 8.9  --  8.7* 9.0  PHOS  --   --  5.9*  --    Liver Function Tests: Recent Labs  Lab 02/11/20 0235 02/12/20 2243  AST 32  --   ALT 33  --   ALKPHOS 56  --   BILITOT 1.4*  --   PROT 6.0*  --   ALBUMIN 3.3* 2.8*   CBC: Recent Labs  Lab 02/10/20 1612 02/11/20 0235 02/11/20 1203 02/12/20 2243 02/13/20 0249  WBC 13.8* 12.8* 13.1* 14.2* 13.7*  NEUTROABS 4.5  --   --   --  4.0  HGB 12.0* 11.7* 11.9* 11.4* 10.9*  HCT 40.9 39.1 40.3 36.0* 36.4*  MCV 90.1 90.3 89.4 86.3 88.6  PLT 138* 127* 139* 129* 124*   CBG: Recent Labs  Lab 02/11/20 2049 02/12/20 0744 02/12/20 1132 02/12/20 1859 02/13/20 0015  GLUCAP 141* 106* 128* 99 80    Studies/Results: PERIPHERAL VASCULAR CATHETERIZATION  Result Date: 02/12/2020 Patient name: ARSHIA RONDON MRN: [id number] DOB:  27-Jun-1940 Sex: male 02/12/2020 Pre-operative Diagnosis: Right leg ulcers Post-operative diagnosis:  Same Surgeon:  Annamarie Major Procedure Performed:  1.  Ultrasound-guided access, left femoral artery  2.  Abdominal aortogram  3.  Right leg runoff  4.  Stent, right femoral-popliteal artery  5.  Stent, right tibioperoneal trunk  6.  Balloon angioplasty, right peroneal artery  7.  Conscious sedation, 138 minutes Indications: The patient has previously undergone a right femoral to below-knee popliteal artery bypass graft with vein.  He has developed ischemic changes to his toes.  His bypass graft is presumed to be occluded.  He comes in today for angiography and possible intervention. Procedure:  The patient was identified in the holding area and taken to room 8.  The patient was then placed supine on the table and prepped and draped in the usual sterile fashion.  A time out was called.  Conscious sedation was administered with the use of IV fentanyl and Versed under continuous physician and nurse monitoring.  Heart rate, blood pressure, and oxygen saturation were continuously monitored.  Total sedation time was 138 minutes.  Ultrasound was used  to evaluate the left common femoral artery.  It was patent .  A digital ultrasound image was acquired.  A micropuncture needle was used to access the left common femoral artery under ultrasound guidance.  An 018 wire was advanced without resistance and a micropuncture sheath was placed.  The 018 wire was removed and a benson wire was placed.  The micropuncture sheath was exchanged for a 5 french sheath.  An omniflush catheter was advanced over the wire to the level of L-1.  An abdominal angiogram was obtained.  Next, using the omniflush catheter and a benson wire, the aortic bifurcation was crossed and the catheter was placed into theright external iliac artery and right runoff was obtained.  Findings:  Aortogram: No significant renal artery stenosis was identified.  No  significant aortic stenosis identified.  No significant bilateral iliac stenosis was identified.  Right Lower Extremity: The right common femoral artery is widely patent.  The right profundofemoral artery is without stenosis.  The proximal portion of the superficial femoral artery is diseased but patent and then occludes in the mid thigh with reconstitution of the below-knee popliteal artery with single-vessel runoff.  The right femoral-popliteal bypass graft opacifies proximally but then occludes just beyond its origin.  Left Lower Extremity: Not evaluated due to procedural time and patient's difficulty to sit still Intervention: After the above images were acquired the decision was made to proceed with intervention.  A 6 French 45 cm sheath was advanced over the aortic bifurcation and placed into the right external iliac artery.  The patient was fully heparinized.  I tried to recanalize the right superficial femoral artery.  I used a Glidewire advantage and a quick cross catheter.  With significant difficulty, I was ultimately able to get the catheter wire down into the distal thigh.  I struggled trying to pass the catheter further past the joint space.  I switched out to a glide catheter and tried multiple wires.  Ultimately, I was not ever able to reenter into the below-knee popliteal artery.  I had created a large dissection plane which precluded further attempts at recanalization. Given that the patient had very little surgical options, I felt trying to recanalize his bypass graft was a reasonable option.  The wire was then directed into the proximal bypass graft.  Using the Glidewire advantage and a quick cross catheter I was ultimately able to cross through the bypass graft and get wire access into the below-knee popliteal artery.  This was confirmed with a contrast injection.  I then tried to advance a 5 x 100 INNOVA stent to cross the distal anastomosis however this would not pass.  The stent was removed  and then a 4 x 100 balloon was inserted.  Balloon angioplasty across the distal anastomosis was performed and then I reinserted the 5 x 100 stent.  My intent was to deploy the stent into the below-knee popliteal artery, however upon deployment the stent jumped forward into the tibioperoneal trunk.  I then elected to extend approximately it with overlapping 5 x 250 Viabahn stents.  2 were palced, with approximately 1 cm overlap and landing at the origin of the bypass graft.  The stents were then molded with a 5 mm balloon.  Follow-up imaging revealed a widely patent bypass graft.  There did appear to be occlusion of the peroneal artery at this time.  I then inserted a 3 x 100 Sterling balloon and perform balloon angioplasty of the peroneal artery.  After 2 inflations  of the balloon in the peroneal artery a completion arteriogram was performed.  This showed inline flow through the bypass graft and into the peroneal artery which reconstitutes the posterior tibial across the ankle.  At this point, I elected to terminate the procedure.  The sheath was withdrawn and the patient be taken to the holding area for sheath pull. Impression:  #1  Occluded right superficial femoral and right femoral-popliteal bypass graft  #2  Failed attempt at recanalization of the native right superficial femoral and popliteal artery  #3  Successful recanalization of the right femoral below-knee popliteal artery bypass graft.  A 5 x 100 INNOVA stent was placed beginning in the tibioperoneal trunk extending up into the bypass graft.  I then placed 2 overlapping 5x250 Viabahn stents within the vein graft.  Following the procedure, the patient had inline flow through the recanalized bypass graft and into the peroneal artery which perfuses the foot. Theotis Burrow, M.D., [facility] [place] Office: [phone] Pager:  [phone]  Medications: . ceFEPime (MAXIPIME) IV 1 g (02/11/20 2159)  . vancomycin     .  calcitRIOL  0.25 mcg Oral Q M,W,F  . Chlorhexidine Gluconate Cloth  6 each Topical Daily  . Chlorhexidine Gluconate Cloth  6 each Topical Q0600  . cyanocobalamin  1,000 mcg Intramuscular Q30 days  . folic acid  1 mg Oral Daily  . heparin  5,000 Units Subcutaneous Q8H  . insulin aspart  0-15 Units Subcutaneous TID WC  . insulin aspart  0-5 Units Subcutaneous QHS  . midodrine  10 mg Oral Q T,Th,Sa-HD  . multivitamin  1 tablet Oral QHS  . pravastatin  40 mg Oral QHS    Dialysis Orders: Center: [place]  on TTS. 180NRe, 4 hours, BFR 400, DFR 800, EDW 85kg, 2K/2.5Ca, TDC, no heparin No ESA Calcitriol 0.75 mcg PO q HD  Assessment/Plan: 1. Gangrene of R foot/PAD: VVS following, s/p arteriogram on 02/13/20. Antibiotics per primary team.  2.  ESRD:  TTS schedule, off schedule today due to procedure late last night. Resume TTS schedule tomorrow.  Dialysis treatments are currently shortened due to high census/nursing shortage. 3.  Hypotension/volume: BP controlled, mild edema on exam. Continue midodrine prior to HD. UF 2L with HD as tolerated 4.  Anemia: Hgb at goal. No ESA indicated at this time.  5.  Metabolic bone disease: Calcium controlled. Phos 5.9. Continue calcitriol. No binder on outpt med list. If phosphorus remains high will need to start a non-calcium based binder.  6. T2DM: Mgt per primary team 7. Thrombocytopenia: Stable, no heparin with HD   Anice Paganini, PA-C 02/13/2020, 8:12 AM  [facility] Pager: [phone]  I have seen and examined this patient and agree with plan and assessment in the above note with renal recommendations/intervention highlighted.  Off schedule due to census and delay in surgery.  Will try to get back on schedule this week. Broadus John A Hanz [place],MD 02/13/2020 9:03 AM

## 2020-02-13 NOTE — Consult Note (Signed)
ORTHOPAEDIC CONSULTATION  REQUESTING PHYSICIAN: Shelly Coss, MD  Chief Complaint: Dry gangrene foot.  HPI: Thomas Mcguire is a 80 y.o. 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  Patient underwent revascularization yesterday for the right lower extremity.  Patient has undergone dialysis today for a short session and plan for dialysis again tomorrow.  Past Medical History:  Diagnosis Date  . Anemia    low iron  . Arthritis   . Chronic renal insufficiency    CHECKED Q3MOS PER PT. Stage 4 as of 05/22/2018 per pt.  . Diabetes mellitus    type 2  . ESRD (end stage renal disease) ([place])   . Gout    Has not had recently- 08/24/11  . Hyperlipidemia   . Hypertension   . 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 PAD (peripheral artery disease) ([place])   . Wears dentures    Past Surgical History:  Procedure Laterality Date  . A/V FISTULAGRAM N/A 08/11/2018   Procedure: A/V FISTULAGRAM - Right Arm;  Surgeon: Angelia Mould, MD;  Location: [facility];  Service: Cardiovascular;  Laterality: N/A;  . A/V FISTULAGRAM N/A 09/07/2019   Procedure: A/V FISTULAGRAM - Right AVF;  Surgeon: Angelia Mould, MD;  Location: [facility];  Service: Cardiovascular;  Laterality: N/A;  . ABDOMINAL AORTOGRAM W/LOWER EXTREMITY Right 07/03/2018   Procedure: ABDOMINAL AORTOGRAM W/LOWER EXTREMITY;  Surgeon: Waynetta Sandy, MD;  Location: [facility];  Service: Cardiovascular;  Laterality: Right;  . ABDOMINAL AORTOGRAM W/LOWER EXTREMITY N/A 02/12/2020   Procedure: ABDOMINAL AORTOGRAM W/LOWER EXTREMITY;  Surgeon: Serafina Mitchell, MD;  Location: [facility];  Service: Cardiovascular;  Laterality: N/A;  . AMPUTATION  08/27/2011   Procedure: AMPUTATION DIGIT;  Surgeon: Newt Minion, MD;  Location: [facility];  Service: Orthopedics;  Laterality: Left;  Left Foot 3rd toe Amputation MTP joint  . AMPUTATION Right 07/11/2018    Procedure: AMPUTATION RIGHT GREAT TOE;  Surgeon: Angelia Mould, MD;  Location: [facility];  Service: Vascular;  Laterality: Right;  . amputation great toe  2009   left  . AV FISTULA PLACEMENT Right 05/23/2018   Procedure: RADIAL- CEPHALIC ARTERIOVENOUS (AV) FISTULA CREATION RIGHT ARM AND LIGATION OF COMPETING BRANCH.;  Surgeon: Angelia Mould, MD;  Location: [facility];  Service: Vascular;  Laterality: Right;  . BASCILIC VEIN TRANSPOSITION Right 10/01/2019   Procedure: FIRST STAGE BASCILIC VEIN TRANSPOSITION;  Surgeon: Angelia Mould, MD;  Location: [facility];  Service: Vascular;  Laterality: Right;  . BASCILIC VEIN TRANSPOSITION Right 11/13/2019   Procedure: RIGHT SECOND STAGE BASCILIC VEIN TRANSPOSITION;  Surgeon: Rosetta Posner, MD;  Location: [facility];  Service: Vascular;  Laterality: Right;  . COLONOSCOPY    . cyst elbow  1999   right  . FEMORAL-POPLITEAL BYPASS GRAFT Right 07/11/2018   Procedure: BYPASS GRAFT FEMORAL-POPLITEAL ARTERY;  Surgeon: Angelia Mould, MD;  Location: [facility];  Service: Vascular;  Laterality: Right;  . FOOT SURGERY     both feet for something that was torn  . IR FLUORO GUIDE CV LINE RIGHT  11/08/2019  . IR US GUIDE VASC ACCESS RIGHT  11/08/2019  . LIGATION OF ARTERIOVENOUS  FISTULA Right 10/01/2019   Procedure: LIGATION OF RIGHT RADIOCEPHALIC  ARTERIOVENOUS  FISTULA;  Surgeon: Angelia Mould, MD;  Location: [facility];  Service: Vascular;  Laterality: Right;  . PERIPHERAL VASCULAR BALLOON ANGIOPLASTY Right 02/12/2020  Procedure: PERIPHERAL VASCULAR BALLOON ANGIOPLASTY;  Surgeon: Serafina Mitchell, MD;  Location: [facility];  Service: Cardiovascular;  Laterality: Right;  peroneal  . PERIPHERAL VASCULAR INTERVENTION Right 02/12/2020   Procedure: PERIPHERAL VASCULAR INTERVENTION;  Surgeon: Serafina Mitchell, MD;  Location: [facility];  Service: Cardiovascular;  Laterality: Right;  fem/pop bypass graft  . TOE AMPUTATION  2010   left 2nd toe  .  UPPER GI ENDOSCOPY     Social History   Socioeconomic History  . Marital status: Married    Spouse name: Thomas Mcguire  . Number of children: Not on file  . Years of education: Not on file  . Highest education level: Not on file  Occupational History  . Not on file  Tobacco Use  . Smoking status: Former Smoker    Types: Cigarettes    Quit date: 03/11/1996    Years since quitting: 23.9  . Smokeless tobacco: Never Used  Vaping Use  . Vaping Use: Never used  Substance and Sexual Activity  . Alcohol use: No  . Drug use: No  . Sexual activity: Not Currently  Other Topics Concern  . Not on file  Social History Narrative  . Not on file   Social Determinants of Health   Financial Resource Strain: Not on file  Food Insecurity: Not on file  Transportation Needs: Not on file  Physical Activity: Not on file  Stress: Not on file  Social Connections: Not on file   Family History  Problem Relation Age of Onset  . Colon cancer Neg Hx    - negative except otherwise stated in the family history section No Known Allergies Prior to Admission medications   Medication Sig Start Date End Date Taking? Authorizing Provider  B Complex-C-Folic Acid (NEPHRO-VITE RX PO) Take 1 tablet by mouth at bedtime. 11/27/19  Yes [provider]  calcitRIOL (ROCALTROL) 0.25 MCG capsule Take 0.25 mcg by mouth every Monday, Wednesday, and Friday.  10/27/17  Yes [provider]  cyanocobalamin (,VITAMIN B-12,) 1000 MCG/ML injection Vitamin B12 injection: 1000 mcg (1 mL) by subcu injection once daily for 7 days; then once weekly for 4 weeks; then once monthly thereafter.  7 07/15/2010 Patient taking differently: Inject 1,000 mcg into the muscle every 30 (thirty) days. 12/21/18  Yes Ladell Pier, MD  folic acid (FOLVITE) 1 MG tablet TAKE 1 TABLET BY MOUTH EVERY DAY Patient taking differently: Take 1 mg by mouth daily. 09/06/19  Yes Ladell Pier, MD  insulin NPH Human (NOVOLIN N) 100 UNIT/ML  injection Inject 0.5 mLs (50 Units total) into the skin daily before breakfast. Patient taking differently: Inject 50 Units into the skin daily as needed (high blood sugar). 11/14/19  Yes Vann, Jessica U, DO  melatonin 1 MG TABS tablet Take 3 mg by mouth at bedtime as needed (sleep).   Yes [provider]  midodrine (PROAMATINE) 10 MG tablet Take 10 mg by mouth 3 (three) times a week. Cain Saupe, Sat 11/22/19  Yes [provider]  pravastatin (PRAVACHOL) 40 MG tablet Take 40 mg by mouth at bedtime.   Yes [provider]   PERIPHERAL VASCULAR CATHETERIZATION  Result Date: 02/12/2020 Patient name: JAMARII BANKS MRN: [id number] DOB: May 03, 1940 Sex: male 02/12/2020 Pre-operative Diagnosis: Right leg ulcers Post-operative diagnosis:  Same Surgeon:  Annamarie Major Procedure Performed:  1.  Ultrasound-guided access, left femoral artery  2.  Abdominal aortogram  3.  Right leg runoff  4.  Stent, right femoral-popliteal  artery  5.  Stent, right tibioperoneal trunk  6.  Balloon angioplasty, right peroneal artery  7.  Conscious sedation, 138 minutes Indications: The patient has previously undergone a right femoral to below-knee popliteal artery bypass graft with vein.  He has developed ischemic changes to his toes.  His bypass graft is presumed to be occluded.  He comes in today for angiography and possible intervention. Procedure:  The patient was identified in the holding area and taken to room 8.  The patient was then placed supine on the table and prepped and draped in the usual sterile fashion.  A time out was called.  Conscious sedation was administered with the use of IV fentanyl and Versed under continuous physician and nurse monitoring.  Heart rate, blood pressure, and oxygen saturation were continuously monitored.  Total sedation time was 138 minutes.  Ultrasound was used to evaluate the left common femoral artery.  It was patent .  A digital ultrasound image was acquired.  A  micropuncture needle was used to access the left common femoral artery under ultrasound guidance.  An 018 wire was advanced without resistance and a micropuncture sheath was placed.  The 018 wire was removed and a benson wire was placed.  The micropuncture sheath was exchanged for a 5 french sheath.  An omniflush catheter was advanced over the wire to the level of L-1.  An abdominal angiogram was obtained.  Next, using the omniflush catheter and a benson wire, the aortic bifurcation was crossed and the catheter was placed into theright external iliac artery and right runoff was obtained.  Findings:  Aortogram: No significant renal artery stenosis was identified.  No significant aortic stenosis identified.  No significant bilateral iliac stenosis was identified.  Right Lower Extremity: The right common femoral artery is widely patent.  The right profundofemoral artery is without stenosis.  The proximal portion of the superficial femoral artery is diseased but patent and then occludes in the mid thigh with reconstitution of the below-knee popliteal artery with single-vessel runoff.  The right femoral-popliteal bypass graft opacifies proximally but then occludes just beyond its origin.  Left Lower Extremity: Not evaluated due to procedural time and patient's difficulty to sit still Intervention: After the above images were acquired the decision was made to proceed with intervention.  A 6 French 45 cm sheath was advanced over the aortic bifurcation and placed into the right external iliac artery.  The patient was fully heparinized.  I tried to recanalize the right superficial femoral artery.  I used a Glidewire advantage and a quick cross catheter.  With significant difficulty, I was ultimately able to get the catheter wire down into the distal thigh.  I struggled trying to pass the catheter further past the joint space.  I switched out to a glide catheter and tried multiple wires.  Ultimately, I was not ever able to  reenter into the below-knee popliteal artery.  I had created a large dissection plane which precluded further attempts at recanalization. Given that the patient had very little surgical options, I felt trying to recanalize his bypass graft was a reasonable option.  The wire was then directed into the proximal bypass graft.  Using the Glidewire advantage and a quick cross catheter I was ultimately able to cross through the bypass graft and get wire access into the below-knee popliteal artery.  This was confirmed with a contrast injection.  I then tried to advance a 5 x 100 INNOVA stent to cross the distal anastomosis however  this would not pass.  The stent was removed and then a 4 x 100 balloon was inserted.  Balloon angioplasty across the distal anastomosis was performed and then I reinserted the 5 x 100 stent.  My intent was to deploy the stent into the below-knee popliteal artery, however upon deployment the stent jumped forward into the tibioperoneal trunk.  I then elected to extend approximately it with overlapping 5 x 250 Viabahn stents.  2 were palced, with approximately 1 cm overlap and landing at the origin of the bypass graft.  The stents were then molded with a 5 mm balloon.  Follow-up imaging revealed a widely patent bypass graft.  There did appear to be occlusion of the peroneal artery at this time.  I then inserted a 3 x 100 Sterling balloon and perform balloon angioplasty of the peroneal artery.  After 2 inflations of the balloon in the peroneal artery a completion arteriogram was performed.  This showed inline flow through the bypass graft and into the peroneal artery which reconstitutes the posterior tibial across the ankle.  At this point, I elected to terminate the procedure.  The sheath was withdrawn and the patient be taken to the holding area for sheath pull. Impression:  #1  Occluded right superficial femoral and right femoral-popliteal bypass graft  #2  Failed attempt at recanalization of the  native right superficial femoral and popliteal artery  #3  Successful recanalization of the right femoral below-knee popliteal artery bypass graft.  A 5 x 100 INNOVA stent was placed beginning in the tibioperoneal trunk extending up into the bypass graft.  I then placed 2 overlapping 5x250 Viabahn stents within the vein graft.  Following the procedure, the patient had inline flow through the recanalized bypass graft and into the peroneal artery which perfuses the foot. Theotis Burrow, M.D., FACS [facility] Office: [phone] Pager:  [phone]  - pertinent xrays, CT, MRI studies were reviewed and independently interpreted  Positive ROS: All other systems have been reviewed and were otherwise negative with the exception of those mentioned in the HPI and as above.  Physical Exam: General: Alert, no acute distress Psychiatric: Patient is competent for consent with normal mood and affect Lymphatic: No axillary or cervical lymphadenopathy Cardiovascular: No pedal edema Respiratory: No cyanosis, no use of accessory musculature GI: No organomegaly, abdomen is soft and non-tender    Images:  @ENCIMAGES @  Labs:  Lab Results  Component Value Date   HGBA1C 6.1 (H) 02/11/2020   HGBA1C 6.2 (H) 11/07/2019   HGBA1C 6.8 (H) 07/11/2018   CRP <0.8 10/14/2017   LABURIC 5.8 11/02/2017   REPTSTATUS PENDING 02/12/2020   REPTSTATUS PENDING 02/12/2020   CULT  02/12/2020    NO GROWTH < 12 HOURS Performed at [facility], [place] [address] [place], [place] [address]    CULT  02/12/2020    NO GROWTH < 12 HOURS Performed at [facility] [address] [place], [place] [address]     Lab Results  Component Value Date   ALBUMIN 2.8 (L) 02/12/2020   ALBUMIN 3.3 (L) 02/11/2020   ALBUMIN 2.4 (L) 11/12/2019   LABURIC 5.8 11/02/2017    Neurologic: Patient does not have protective sensation bilateral lower extremities.   MUSCULOSKELETAL:   Skin:  Examination patient has further demarcation of the gangrene of the lesser toes 2 through 5 status post great toe amputation.  The toes are dry and gangrenous with no ascending cellulitis no abscess.  Review of the vascular studies shows revascularization to the right lower extremity with inline flow to the foot through the peroneal artery.  Assessment: Revascularization right lower extremity with dry gangrene of toes 2 through 5 status post great toe amputation.  Plan: Will plan for a right transmetatarsal amputation on Friday, patient will be first case at 7:30.  Anticipate dialysis on Wednesday and could proceed with dialysis on Friday afternoon.  Thank you for the consult and the opportunity to see Mr. Lyndel Safe, MD [place] [phone] 2:04 PM

## 2020-02-13 NOTE — Progress Notes (Signed)
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  He was found to have gangrene of the second, third and fourth right toes.  On presentation he was hemodynamically stable.  X-rays of the foot showed no evidence of osteomyelitis but large linear metallic foreign body in the plantar soft tissue of the foot and on the mid first metatarsal.  Started on broad spectrum antibiotics.  Patient was transferred from [facility] to [facility] for  continuation of dialysis.  He underwent  lower extremity arteriogram with  intervention on 02/12/20.  Nephrology following for dialysis.  Dr. 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  X-ray did not show  any evidence of osteomyelitis.  Vascular surgery following.  Underwent  lower extremity angiogram stenting of  right femoral-popliteal artery ,  right tibioperoneal trunk and Balloon angioplasty on 02/12/20.  Continue current  antibiotics.  Follow-up cultures.  Currently is afebrile, hemodynamically stable.  Has mild leukocytosis. Vascular surgery consulting Dr. 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  We will continue his home medications on discharge.  ESRD on dialysis: Dialyzed on TTS schedule.  Nephrology following for dialysis  Diabetes type 2: Recent hemoglobin C of 6.1.  Monitor blood sugars.  Continue current regimen  Normocytic anemia: Secondary to ESRD.  Continue to monitor.  Currently hemoglobin stable.  History of CLL: Follows with Dr. Learta Codding.  Outpatient follow-up recommended  Thrombocytopenia: Continue to monitor.  Stable  Chronic hypotension: On midodrine  Hyperlipidemia: On Pravachol         DVT prophylaxis:Heparin [place] Code Status: Full Family Communication: None at bedside Status is: Inpatient  Remains inpatient appropriate because:Inpatient level of care appropriate due to severity of illness   Dispo: The patient is from: Home              Anticipated d/c is to: Home              Anticipated d/c date is: 3 days              Patient currently is not medically stable to d/c.   Difficult to place patient No     Consultants: Vascular surgery, nephrology  Procedures: as above, dialysis  Antimicrobials:  Anti-infectives (From admission, onward)   Start     Dose/Rate Route Frequency Ordered Stop   02/12/20 1200  vancomycin (VANCOCIN) IVPB 1000 mg/200 mL premix        1,000 mg 200 mL/hr over 60 Minutes Intravenous Every T-Th-Sa (Hemodialysis)  02/10/20 1904     02/11/20 0000  ceFEPIme (MAXIPIME) 1 g in sodium chloride 0.9 % 100 mL IVPB        1 g 200 mL/hr over 30 Minutes Intravenous Daily-1800 02/10/20 1904     02/10/20 1630  vancomycin (VANCOREADY) IVPB 2000 mg/400 mL        2,000 mg 200 mL/hr over 120 Minutes Intravenous STAT 02/10/20 1619 02/10/20 1929   02/10/20 1630  piperacillin-tazobactam (ZOSYN) IVPB 3.375 g        3.375 g 100 mL/hr over 30 Minutes  Intravenous STAT 02/10/20 1619 02/10/20 1707      Subjective:  Patient seen and examined the bedside this morning.  Hemodynamically stable.  Comfortable.  Denies any new complaints  Objective: Vitals:   02/13/20 0131 02/13/20 0258 02/13/20 0358 02/13/20 0515  BP: (!) 118/56 114/60 (!) 120/58 118/60  Pulse: 87 88 88 86  Resp: 17 20 15 18   Temp:  97.8 F (36.6 C)    TempSrc:  Oral    SpO2: 92% 94% 93% 93%  Weight:      Height:        Intake/Output Summary (Last 24 hours) at 02/13/2020 0825 Last data filed at 02/12/2020 0900 Gross per 24 hour  Intake 0 ml  Output 0 ml  Net 0 ml   Filed Weights   02/12/20 1949  Weight: 91.5 kg    Examination:  General exam: Very pleasant elderly gentleman  HEENT:PERRL,Oral mucosa moist, Ear/Nose normal on gross exam Respiratory system: Bilateral equal air entry, normal vesicular breath sounds, no wheezes or crackles  Cardiovascular system: S1 & S2 heard, RRR. No JVD, murmurs, rubs, gallops or clicks..  Dialysis catheter on the right chest Gastrointestinal system: Abdomen is nondistended, soft and nontender. No organomegaly or masses felt. Normal bowel sounds heard. Central nervous system: Alert and oriented. No focal neurological deficits. Extremities: Blackish discoloration of the toes of the right foot, amputation of the big toe of the right foot  skin: No rashes,no icterus ,no pallor      Data Reviewed: I have personally reviewed following labs and imaging studies  CBC: Recent Labs  Lab 02/10/20 1612 02/11/20 0235 02/11/20 1203 02/12/20 2243 02/13/20 0249  WBC 13.8* 12.8* 13.1* 14.2* 13.7*  NEUTROABS 4.5  --   --   --  4.0  HGB 12.0* 11.7* 11.9* 11.4* 10.9*  HCT 40.9 39.1 40.3 36.0* 36.4*  MCV 90.1 90.3 89.4 86.3 88.6  PLT 138* 127* 139* 129* 315*   Basic Metabolic Panel: Recent Labs  Lab 02/10/20 1612 02/11/20 0235 02/11/20 1203 02/12/20 2243 02/13/20 0249  NA 139 136  --  133* 136  K 3.8 4.5  --  4.0 3.8  CL  94* 95*  --  97* 96*  CO2 32 28  --  21* 23  GLUCOSE 171* 141*  --  87 137*  BUN 45* 45*  --  59* 61*  CREATININE 5.13* 5.25* 5.49* 6.69* 6.81*  CALCIUM 9.3 8.9  --  8.7* 9.0  PHOS  --   --   --  5.9*  --    GFR: Estimated Creatinine Clearance: 11.8 mL/min (A) (by C-G formula based on SCr of 6.81 mg/dL (H)). Liver Function Tests: Recent Labs  Lab 02/11/20 0235 02/12/20 2243  AST 32  --   ALT 33  --   ALKPHOS 56  --   BILITOT 1.4*  --   PROT 6.0*  --   ALBUMIN 3.3* 2.8*   No  results for input(s): LIPASE, AMYLASE in the last 168 hours. No results for input(s): AMMONIA in the last 168 hours. Coagulation Profile: No results for input(s): INR, PROTIME in the last 168 hours. Cardiac Enzymes: No results for input(s): CKTOTAL, CKMB, CKMBINDEX, TROPONINI in the last 168 hours. BNP (last 3 results) No results for input(s): PROBNP in the last 8760 hours. HbA1C: Recent Labs    02/11/20 0218  HGBA1C 6.1*   CBG: Recent Labs  Lab 02/11/20 2049 02/12/20 0744 02/12/20 1132 02/12/20 1859 02/13/20 0015  GLUCAP 141* 106* 128* 99 80   Lipid Profile: No results for input(s): CHOL, HDL, LDLCALC, TRIG, CHOLHDL, LDLDIRECT in the last 72 hours. Thyroid Function Tests: No results for input(s): TSH, T4TOTAL, FREET4, T3FREE, THYROIDAB in the last 72 hours. Anemia Panel: No results for input(s): VITAMINB12, FOLATE, FERRITIN, TIBC, IRON, RETICCTPCT in the last 72 hours. Sepsis Labs: No results for input(s): PROCALCITON, LATICACIDVEN in the last 168 hours.  Recent Results (from the past 240 hour(s))  SARS Coronavirus 2 by RT PCR (hospital order, performed in [facility] hospital lab)     Status: None   Collection Time: 02/10/20  7:00 PM  Result Value Ref Range Status   SARS Coronavirus 2 NEGATIVE NEGATIVE Final    Comment: (NOTE) SARS-CoV-2 target nucleic acids are NOT DETECTED.  The SARS-CoV-2 RNA is generally detectable in upper and lower respiratory specimens during the acute phase  of infection. The lowest concentration of SARS-CoV-2 viral copies this assay can detect is 250 copies / mL. A negative result does not preclude SARS-CoV-2 infection and should not be used as the sole basis for treatment or other patient management decisions.  A negative result may occur with improper specimen collection / handling, submission of specimen other than nasopharyngeal swab, presence of viral mutation(s) within the areas targeted by this assay, and inadequate number of viral copies (<250 copies / mL). A negative result must be combined with clinical observations, patient history, and epidemiological information.  Fact Sheet for Patients:   StrictlyIdeas.no  Fact Sheet for Healthcare Providers: BankingDealers.co.za  This test is not yet approved or  cleared by the [place] FDA and has been authorized for detection and/or diagnosis of SARS-CoV-2 by FDA under an Emergency Use Authorization (EUA).  This EUA will remain in effect (meaning this test can be used) for the duration of the COVID-19 declaration under Section 564(b)(1) of the Act, 21 U.S.C. section 360bbb-3(b)(1), unless the authorization is terminated or revoked sooner.  Performed at [facility], [place] [address] [place], [place] [address]   MRSA PCR Screening     Status: None   Collection Time: 02/11/20 11:07 PM   Specimen: Nasopharyngeal  Result Value Ref Range Status   MRSA by PCR NEGATIVE NEGATIVE Final    Comment:        The GeneXpert MRSA Assay (FDA approved for NASAL specimens only), is one component of a comprehensive MRSA colonization surveillance program. It is not intended to diagnose MRSA infection nor to guide or monitor treatment for MRSA infections. Performed at [facility], [place] [address], [place], [place] [address]          Radiology Studies: PERIPHERAL VASCULAR CATHETERIZATION  Result Date:  02/12/2020 Patient name: Thomas Mcguire MRN: [id number] DOB: Mar 16, 1940 Sex: male 02/12/2020 Pre-operative Diagnosis: Right leg ulcers Post-operative diagnosis:  Same Surgeon:  Annamarie Major Procedure Performed:  1.  Ultrasound-guided access, left femoral artery  2.  Abdominal aortogram  3.  Right  leg runoff  4.  Stent, right femoral-popliteal artery  5.  Stent, right tibioperoneal trunk  6.  Balloon angioplasty, right peroneal artery  7.  Conscious sedation, 138 minutes Indications: The patient has previously undergone a right femoral to below-knee popliteal artery bypass graft with vein.  He has developed ischemic changes to his toes.  His bypass graft is presumed to be occluded.  He comes in today for angiography and possible intervention. Procedure:  The patient was identified in the holding area and taken to room 8.  The patient was then placed supine on the table and prepped and draped in the usual sterile fashion.  A time out was called.  Conscious sedation was administered with the use of IV fentanyl and Versed under continuous physician and nurse monitoring.  Heart rate, blood pressure, and oxygen saturation were continuously monitored.  Total sedation time was 138 minutes.  Ultrasound was used to evaluate the left common femoral artery.  It was patent .  A digital ultrasound image was acquired.  A micropuncture needle was used to access the left common femoral artery under ultrasound guidance.  An 018 wire was advanced without resistance and a micropuncture sheath was placed.  The 018 wire was removed and a benson wire was placed.  The micropuncture sheath was exchanged for a 5 french sheath.  An omniflush catheter was advanced over the wire to the level of L-1.  An abdominal angiogram was obtained.  Next, using the omniflush catheter and a benson wire, the aortic bifurcation was crossed and the catheter was placed into theright external iliac artery and right runoff was obtained.  Findings:  Aortogram: No  significant renal artery stenosis was identified.  No significant aortic stenosis identified.  No significant bilateral iliac stenosis was identified.  Right Lower Extremity: The right common femoral artery is widely patent.  The right profundofemoral artery is without stenosis.  The proximal portion of the superficial femoral artery is diseased but patent and then occludes in the mid thigh with reconstitution of the below-knee popliteal artery with single-vessel runoff.  The right femoral-popliteal bypass graft opacifies proximally but then occludes just beyond its origin.  Left Lower Extremity: Not evaluated due to procedural time and patient's difficulty to sit still Intervention: After the above images were acquired the decision was made to proceed with intervention.  A 6 French 45 cm sheath was advanced over the aortic bifurcation and placed into the right external iliac artery.  The patient was fully heparinized.  I tried to recanalize the right superficial femoral artery.  I used a Glidewire advantage and a quick cross catheter.  With significant difficulty, I was ultimately able to get the catheter wire down into the distal thigh.  I struggled trying to pass the catheter further past the joint space.  I switched out to a glide catheter and tried multiple wires.  Ultimately, I was not ever able to reenter into the below-knee popliteal artery.  I had created a large dissection plane which precluded further attempts at recanalization. Given that the patient had very little surgical options, I felt trying to recanalize his bypass graft was a reasonable option.  The wire was then directed into the proximal bypass graft.  Using the Glidewire advantage and a quick cross catheter I was ultimately able to cross through the bypass graft and get wire access into the below-knee popliteal artery.  This was confirmed with a contrast injection.  I then tried to advance a 5 x 100 INNOVA  stent to cross the distal anastomosis  however this would not pass.  The stent was removed and then a 4 x 100 balloon was inserted.  Balloon angioplasty across the distal anastomosis was performed and then I reinserted the 5 x 100 stent.  My intent was to deploy the stent into the below-knee popliteal artery, however upon deployment the stent jumped forward into the tibioperoneal trunk.  I then elected to extend approximately it with overlapping 5 x 250 Viabahn stents.  2 were palced, with approximately 1 cm overlap and landing at the origin of the bypass graft.  The stents were then molded with a 5 mm balloon.  Follow-up imaging revealed a widely patent bypass graft.  There did appear to be occlusion of the peroneal artery at this time.  I then inserted a 3 x 100 Sterling balloon and perform balloon angioplasty of the peroneal artery.  After 2 inflations of the balloon in the peroneal artery a completion arteriogram was performed.  This showed inline flow through the bypass graft and into the peroneal artery which reconstitutes the posterior tibial across the ankle.  At this point, I elected to terminate the procedure.  The sheath was withdrawn and the patient be taken to the holding area for sheath pull. Impression:  #1  Occluded right superficial femoral and right femoral-popliteal bypass graft  #2  Failed attempt at recanalization of the native right superficial femoral and popliteal artery  #3  Successful recanalization of the right femoral below-knee popliteal artery bypass graft.  A 5 x 100 INNOVA stent was placed beginning in the tibioperoneal trunk extending up into the bypass graft.  I then placed 2 overlapping 5x250 Viabahn stents within the vein graft.  Following the procedure, the patient had inline flow through the recanalized bypass graft and into the peroneal artery which perfuses the foot. Theotis Burrow, M.D., [facility] [place] Office: [phone] Pager:  [phone]       Scheduled Meds: .  calcitRIOL  0.25 mcg Oral Q M,W,F  . Chlorhexidine Gluconate Cloth  6 each Topical Daily  . Chlorhexidine Gluconate Cloth  6 each Topical Q0600  . Chlorhexidine Gluconate Cloth  6 each Topical Q0600  . cyanocobalamin  1,000 mcg Intramuscular Q30 days  . folic acid  1 mg Oral Daily  . heparin  5,000 Units Subcutaneous Q8H  . insulin aspart  0-15 Units Subcutaneous TID WC  . insulin aspart  0-5 Units Subcutaneous QHS  . midodrine  10 mg Oral Q T,Th,Sa-HD  . multivitamin  1 tablet Oral QHS  . pravastatin  40 mg Oral QHS   Continuous Infusions: . ceFEPime (MAXIPIME) IV 1 g (02/11/20 2159)  . vancomycin       LOS: 3 days    Time spent: 25 mins.More than 50% of that time was spent in counseling and/or coordination of care.      Shelly Coss, MD [facility] P2/02/2020, 8:25 AM

## 2020-02-13 NOTE — H&P (View-Only) (Signed)
ORTHOPAEDIC CONSULTATION  REQUESTING PHYSICIAN: Shelly Coss, MD  Chief Complaint: Dry gangrene foot.  HPI: Thomas Mcguire is a 80 y.o. 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  Patient underwent revascularization yesterday for the right lower extremity.  Patient has undergone dialysis today for a short session and plan for dialysis again tomorrow.  Past Medical History:  Diagnosis Date  . Anemia    low iron  . Arthritis   . Chronic renal insufficiency    CHECKED Q3MOS PER PT. Stage 4 as of 05/22/2018 per pt.  . Diabetes mellitus    type 2  . ESRD (end stage renal disease) ([place])   . Gout    Has not had recently- 08/24/11  . Hyperlipidemia   . Hypertension   . 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 PAD (peripheral artery disease) ([place])   . Wears dentures    Past Surgical History:  Procedure Laterality Date  . A/V FISTULAGRAM N/A 08/11/2018   Procedure: A/V FISTULAGRAM - Right Arm;  Surgeon: Angelia Mould, MD;  Location: [facility];  Service: Cardiovascular;  Laterality: N/A;  . A/V FISTULAGRAM N/A 09/07/2019   Procedure: A/V FISTULAGRAM - Right AVF;  Surgeon: Angelia Mould, MD;  Location: [facility];  Service: Cardiovascular;  Laterality: N/A;  . ABDOMINAL AORTOGRAM W/LOWER EXTREMITY Right 07/03/2018   Procedure: ABDOMINAL AORTOGRAM W/LOWER EXTREMITY;  Surgeon: Waynetta Sandy, MD;  Location: [facility];  Service: Cardiovascular;  Laterality: Right;  . ABDOMINAL AORTOGRAM W/LOWER EXTREMITY N/A 02/12/2020   Procedure: ABDOMINAL AORTOGRAM W/LOWER EXTREMITY;  Surgeon: Serafina Mitchell, MD;  Location: [facility];  Service: Cardiovascular;  Laterality: N/A;  . AMPUTATION  08/27/2011   Procedure: AMPUTATION DIGIT;  Surgeon: Newt Minion, MD;  Location: [facility];  Service: Orthopedics;  Laterality: Left;  Left Foot 3rd toe Amputation MTP joint  . AMPUTATION Right 07/11/2018    Procedure: AMPUTATION RIGHT GREAT TOE;  Surgeon: Angelia Mould, MD;  Location: [facility];  Service: Vascular;  Laterality: Right;  . amputation great toe  2009   left  . AV FISTULA PLACEMENT Right 05/23/2018   Procedure: RADIAL- CEPHALIC ARTERIOVENOUS (AV) FISTULA CREATION RIGHT ARM AND LIGATION OF COMPETING BRANCH.;  Surgeon: Angelia Mould, MD;  Location: [facility];  Service: Vascular;  Laterality: Right;  . BASCILIC VEIN TRANSPOSITION Right 10/01/2019   Procedure: FIRST STAGE BASCILIC VEIN TRANSPOSITION;  Surgeon: Angelia Mould, MD;  Location: [facility];  Service: Vascular;  Laterality: Right;  . BASCILIC VEIN TRANSPOSITION Right 11/13/2019   Procedure: RIGHT SECOND STAGE BASCILIC VEIN TRANSPOSITION;  Surgeon: Rosetta Posner, MD;  Location: [facility];  Service: Vascular;  Laterality: Right;  . COLONOSCOPY    . cyst elbow  1999   right  . FEMORAL-POPLITEAL BYPASS GRAFT Right 07/11/2018   Procedure: BYPASS GRAFT FEMORAL-POPLITEAL ARTERY;  Surgeon: Angelia Mould, MD;  Location: [facility];  Service: Vascular;  Laterality: Right;  . FOOT SURGERY     both feet for something that was torn  . IR FLUORO GUIDE CV LINE RIGHT  11/08/2019  . IR US GUIDE VASC ACCESS RIGHT  11/08/2019  . LIGATION OF ARTERIOVENOUS  FISTULA Right 10/01/2019   Procedure: LIGATION OF RIGHT RADIOCEPHALIC  ARTERIOVENOUS  FISTULA;  Surgeon: Angelia Mould, MD;  Location: [facility];  Service: Vascular;  Laterality: Right;  . PERIPHERAL VASCULAR BALLOON ANGIOPLASTY Right 02/12/2020  Procedure: PERIPHERAL VASCULAR BALLOON ANGIOPLASTY;  Surgeon: Serafina Mitchell, MD;  Location: [facility];  Service: Cardiovascular;  Laterality: Right;  peroneal  . PERIPHERAL VASCULAR INTERVENTION Right 02/12/2020   Procedure: PERIPHERAL VASCULAR INTERVENTION;  Surgeon: Serafina Mitchell, MD;  Location: [facility];  Service: Cardiovascular;  Laterality: Right;  fem/pop bypass graft  . TOE AMPUTATION  2010   left 2nd toe  .  UPPER GI ENDOSCOPY     Social History   Socioeconomic History  . Marital status: Married    Spouse name: [name]  . Number of children: Not on file  . Years of education: Not on file  . Highest education level: Not on file  Occupational History  . Not on file  Tobacco Use  . Smoking status: Former Smoker    Types: Cigarettes    Quit date: 03/11/1996    Years since quitting: 23.9  . Smokeless tobacco: Never Used  Vaping Use  . Vaping Use: Never used  Substance and Sexual Activity  . Alcohol use: No  . Drug use: No  . Sexual activity: Not Currently  Other Topics Concern  . Not on file  Social History Narrative  . Not on file   Social Determinants of Health   Financial Resource Strain: Not on file  Food Insecurity: Not on file  Transportation Needs: Not on file  Physical Activity: Not on file  Stress: Not on file  Social Connections: Not on file   Family History  Problem Relation Age of Onset  . Colon cancer Neg Hx    - negative except otherwise stated in the family history section No Known Allergies Prior to Admission medications   Medication Sig Start Date End Date Taking? Authorizing Provider  B Complex-C-Folic Acid (NEPHRO-VITE RX PO) Take 1 tablet by mouth at bedtime. 11/27/19  Yes [provider]  calcitRIOL (ROCALTROL) 0.25 MCG capsule Take 0.25 mcg by mouth every Monday, Wednesday, and Friday.  10/27/17  Yes [provider]  cyanocobalamin (,VITAMIN B-12,) 1000 MCG/ML injection Vitamin B12 injection: 1000 mcg (1 mL) by subcu injection once daily for 7 days; then once weekly for 4 weeks; then once monthly thereafter.  7 07/15/2010 Patient taking differently: Inject 1,000 mcg into the muscle every 30 (thirty) days. 12/21/18  Yes Ladell Pier, MD  folic acid (FOLVITE) 1 MG tablet TAKE 1 TABLET BY MOUTH EVERY DAY Patient taking differently: Take 1 mg by mouth daily. 09/06/19  Yes Ladell Pier, MD  insulin NPH Human (NOVOLIN N) 100 UNIT/ML  injection Inject 0.5 mLs (50 Units total) into the skin daily before breakfast. Patient taking differently: Inject 50 Units into the skin daily as needed (high blood sugar). 11/14/19  Yes Vann, Jessica U, DO  melatonin 1 MG TABS tablet Take 3 mg by mouth at bedtime as needed (sleep).   Yes [provider]  midodrine (PROAMATINE) 10 MG tablet Take 10 mg by mouth 3 (three) times a week. Cain Saupe, Sat 11/22/19  Yes [provider]  pravastatin (PRAVACHOL) 40 MG tablet Take 40 mg by mouth at bedtime.   Yes [provider]   PERIPHERAL VASCULAR CATHETERIZATION  Result Date: 02/12/2020 Patient name: Thomas Mcguire MRN: [id number] DOB: [birth year] Sex: male 02/12/2020 Pre-operative Diagnosis: Right leg ulcers Post-operative diagnosis:  Same Surgeon:  Annamarie Major Procedure Performed:  1.  Ultrasound-guided access, left femoral artery  2.  Abdominal aortogram  3.  Right leg runoff  4.  Stent, right femoral-popliteal  artery  5.  Stent, right tibioperoneal trunk  6.  Balloon angioplasty, right peroneal artery  7.  Conscious sedation, 138 minutes Indications: The patient has previously undergone a right femoral to below-knee popliteal artery bypass graft with vein.  He has developed ischemic changes to his toes.  His bypass graft is presumed to be occluded.  He comes in today for angiography and possible intervention. Procedure:  The patient was identified in the holding area and taken to room 8.  The patient was then placed supine on the table and prepped and draped in the usual sterile fashion.  A time out was called.  Conscious sedation was administered with the use of IV fentanyl and Versed under continuous physician and nurse monitoring.  Heart rate, blood pressure, and oxygen saturation were continuously monitored.  Total sedation time was 138 minutes.  Ultrasound was used to evaluate the left common femoral artery.  It was patent .  A digital ultrasound image was acquired.  A  micropuncture needle was used to access the left common femoral artery under ultrasound guidance.  An 018 wire was advanced without resistance and a micropuncture sheath was placed.  The 018 wire was removed and a benson wire was placed.  The micropuncture sheath was exchanged for a 5 french sheath.  An omniflush catheter was advanced over the wire to the level of L-1.  An abdominal angiogram was obtained.  Next, using the omniflush catheter and a benson wire, the aortic bifurcation was crossed and the catheter was placed into theright external iliac artery and right runoff was obtained.  Findings:  Aortogram: No significant renal artery stenosis was identified.  No significant aortic stenosis identified.  No significant bilateral iliac stenosis was identified.  Right Lower Extremity: The right common femoral artery is widely patent.  The right profundofemoral artery is without stenosis.  The proximal portion of the superficial femoral artery is diseased but patent and then occludes in the mid thigh with reconstitution of the below-knee popliteal artery with single-vessel runoff.  The right femoral-popliteal bypass graft opacifies proximally but then occludes just beyond its origin.  Left Lower Extremity: Not evaluated due to procedural time and patient's difficulty to sit still Intervention: After the above images were acquired the decision was made to proceed with intervention.  A 6 French 45 cm sheath was advanced over the aortic bifurcation and placed into the right external iliac artery.  The patient was fully heparinized.  I tried to recanalize the right superficial femoral artery.  I used a Glidewire advantage and a quick cross catheter.  With significant difficulty, I was ultimately able to get the catheter wire down into the distal thigh.  I struggled trying to pass the catheter further past the joint space.  I switched out to a glide catheter and tried multiple wires.  Ultimately, I was not ever able to  reenter into the below-knee popliteal artery.  I had created a large dissection plane which precluded further attempts at recanalization. Given that the patient had very little surgical options, I felt trying to recanalize his bypass graft was a reasonable option.  The wire was then directed into the proximal bypass graft.  Using the Glidewire advantage and a quick cross catheter I was ultimately able to cross through the bypass graft and get wire access into the below-knee popliteal artery.  This was confirmed with a contrast injection.  I then tried to advance a 5 x 100 INNOVA stent to cross the distal anastomosis however  this would not pass.  The stent was removed and then a 4 x 100 balloon was inserted.  Balloon angioplasty across the distal anastomosis was performed and then I reinserted the 5 x 100 stent.  My intent was to deploy the stent into the below-knee popliteal artery, however upon deployment the stent jumped forward into the tibioperoneal trunk.  I then elected to extend approximately it with overlapping 5 x 250 Viabahn stents.  2 were palced, with approximately 1 cm overlap and landing at the origin of the bypass graft.  The stents were then molded with a 5 mm balloon.  Follow-up imaging revealed a widely patent bypass graft.  There did appear to be occlusion of the peroneal artery at this time.  I then inserted a 3 x 100 Sterling balloon and perform balloon angioplasty of the peroneal artery.  After 2 inflations of the balloon in the peroneal artery a completion arteriogram was performed.  This showed inline flow through the bypass graft and into the peroneal artery which reconstitutes the posterior tibial across the ankle.  At this point, I elected to terminate the procedure.  The sheath was withdrawn and the patient be taken to the holding area for sheath pull. Impression:  #1  Occluded right superficial femoral and right femoral-popliteal bypass graft  #2  Failed attempt at recanalization of the  native right superficial femoral and popliteal artery  #3  Successful recanalization of the right femoral below-knee popliteal artery bypass graft.  A 5 x 100 INNOVA stent was placed beginning in the tibioperoneal trunk extending up into the bypass graft.  I then placed 2 overlapping 5x250 Viabahn stents within the vein graft.  Following the procedure, the patient had inline flow through the recanalized bypass graft and into the peroneal artery which perfuses the foot. Theotis Burrow, M.D., FACS [facility] Office: [phone] Pager:  [phone]  - pertinent xrays, CT, MRI studies were reviewed and independently interpreted  Positive ROS: All other systems have been reviewed and were otherwise negative with the exception of those mentioned in the HPI and as above.  Physical Exam: General: Alert, no acute distress Psychiatric: Patient is competent for consent with normal mood and affect Lymphatic: No axillary or cervical lymphadenopathy Cardiovascular: No pedal edema Respiratory: No cyanosis, no use of accessory musculature GI: No organomegaly, abdomen is soft and non-tender    Images:  @ENCIMAGES @  Labs:  Lab Results  Component Value Date   HGBA1C 6.1 (H) 02/11/2020   HGBA1C 6.2 (H) 11/07/2019   HGBA1C 6.8 (H) 07/11/2018   CRP <0.8 10/14/2017   LABURIC 5.8 11/02/2017   REPTSTATUS PENDING 02/12/2020   REPTSTATUS PENDING 02/12/2020   CULT  02/12/2020    NO GROWTH < 12 HOURS Performed at [facility], [place] [address], [place], [place] [address]    CULT  02/12/2020    NO GROWTH < 12 HOURS Performed at [facility] [address], [place], [place] [address]     Lab Results  Component Value Date   ALBUMIN 2.8 (L) 02/12/2020   ALBUMIN 3.3 (L) 02/11/2020   ALBUMIN 2.4 (L) 11/12/2019   LABURIC 5.8 11/02/2017    Neurologic: Patient does not have protective sensation bilateral lower extremities.   MUSCULOSKELETAL:   Skin:  Examination patient has further demarcation of the gangrene of the lesser toes 2 through 5 status post great toe amputation.  The toes are dry and gangrenous with no ascending cellulitis no abscess.  Review of the vascular studies shows revascularization to the right lower extremity with inline flow to the foot through the peroneal artery.  Assessment: Revascularization right lower extremity with dry gangrene of toes 2 through 5 status post great toe amputation.  Plan: Will plan for a right transmetatarsal amputation on Friday, patient will be first case at 7:30.  Anticipate dialysis on Wednesday and could proceed with dialysis on Friday afternoon.  Thank you for the consult and the opportunity to see Mr. Thomas Safe, MD [place] [phone] 2:04 PM

## 2020-02-13 NOTE — Progress Notes (Addendum)
Progress Note    02/13/2020 8:23 AM 1 Day Post-Op  Subjective:  No complaints. HD via right TDC without apparent complications   Vitals:   02/13/20 0358 02/13/20 0515  BP: (!) 120/58 118/60  Pulse: 88 86  Resp: 15 18  Temp:    SpO2: 93% 93%    Physical Exam: General appearance: Awake, alert in no apparent distress Cardiac: Heart rate and rhythm are regular Respirations: Nonlabored Extremities: Right foot is warm. Doppler unit is not functional. Left groin is soft.    CBC    Component Value Date/Time   WBC 13.7 (H) 02/13/2020 0249   RBC 4.11 (L) 02/13/2020 0249   HGB 10.9 (L) 02/13/2020 0249   HGB 12.5 (L) 01/30/2020 1059   HCT 36.4 (L) 02/13/2020 0249   PLT 124 (L) 02/13/2020 0249   PLT 102 (L) 01/30/2020 1059   MCV 88.6 02/13/2020 0249   MCH 26.5 02/13/2020 0249   MCHC 29.9 (L) 02/13/2020 0249   RDW 19.1 (H) 02/13/2020 0249   LYMPHSABS 7.9 (H) 02/13/2020 0249   MONOABS 1.6 (H) 02/13/2020 0249   EOSABS 0.0 02/13/2020 0249   BASOSABS 0.0 02/13/2020 0249    BMET    Component Value Date/Time   NA 136 02/13/2020 0249   K 3.8 02/13/2020 0249   CL 96 (L) 02/13/2020 0249   CO2 23 02/13/2020 0249   GLUCOSE 137 (H) 02/13/2020 0249   BUN 61 (H) 02/13/2020 0249   CREATININE 6.81 (H) 02/13/2020 0249   CREATININE 5.72 (HH) 12/28/2017 1009   CALCIUM 9.0 02/13/2020 0249   GFRNONAA 8 (L) 02/13/2020 0249   GFRNONAA 9 (L) 12/28/2017 1009   GFRAA 12 (L) 09/20/2018 1013   GFRAA 10 (L) 12/28/2017 1009     Intake/Output Summary (Last 24 hours) at 02/13/2020 8588 Last data filed at 02/12/2020 0900 Gross per 24 hour  Intake 0 ml  Output 0 ml  Net 0 ml    HOSPITAL MEDICATIONS Scheduled Meds: . calcitRIOL  0.25 mcg Oral Q M,W,F  . Chlorhexidine Gluconate Cloth  6 each Topical Daily  . Chlorhexidine Gluconate Cloth  6 each Topical Q0600  . Chlorhexidine Gluconate Cloth  6 each Topical Q0600  . cyanocobalamin  1,000 mcg Intramuscular Q30 days  . folic acid  1 mg Oral  Daily  . heparin  5,000 Units Subcutaneous Q8H  . insulin aspart  0-15 Units Subcutaneous TID WC  . insulin aspart  0-5 Units Subcutaneous QHS  . midodrine  10 mg Oral Q T,Th,Sa-HD  . multivitamin  1 tablet Oral QHS  . pravastatin  40 mg Oral QHS   Continuous Infusions: . ceFEPime (MAXIPIME) IV 1 g (02/11/20 2159)  . vancomycin     PRN Meds:.acetaminophen **OR** acetaminophen, insulin NPH Human, morphine injection, ondansetron **OR** ondansetron (ZOFRAN) IV, traZODone  Assessment and Plan: right foot ulcers s/p aortogram with RLE run-off and intervention: Stent, right femoral-popliteal artery Stent, right tibioperoneal trunk Balloon angioplasty, right peroneal artery      Hgb stable       Dr. Carlis Abbott will discuss right foot surgery with Dr. Sharol Given.  -DVT prophylaxis:  Heparin [place]   Risa Grill, PA-C [facility] [phone] 02/13/2020  8:23 AM   I have seen and evaluated the patient. I agree with the PA note as documented above. S/P right leg revascularization by Dr. Trula Slade yesterday for occluded bypass.  Dr. Sharol Given consulted and appreciate his input.  Plan TMA later this week.  [name], MD [facility] [place] Office: [phone]

## 2020-02-13 NOTE — Progress Notes (Signed)
Pharmacy Antibiotic Note  Thomas Mcguire is a 80 y.o. male with a h/o ESRD on HD T/Th/Sa admitted on 02/10/2020 with cellulitis and abscess of foot.  Pharmacy has been consulted for vancomycin and cefepime dosing.   HD off schedule today for short session of 2.5hrs - 174mL removed.  Plan for HD again tomorrow back on schedule but continuing short sessions due to staffing shortage and high census.   Plan: Continue Cefepime 1g IV every 24 hours -PM dosing for post HD.   Vancomycin 500mg  IV x1 today (due to extra HD) then reduce Vancomycin to 500mg  post HD T-Th-S due to shortened sessions.   F/up HD tolerance and session lengths. F/up culture results, clinical status and length of therapy.    Height: 6\' 7"  (200.7 cm) Weight: 89.9 kg (198 lb 3.1 oz) IBW/kg (Calculated) : 93.7  Temp (24hrs), Avg:97.8 F (36.6 C), Min:97.6 F (36.4 C), Max:98.1 F (36.7 C)  Recent Labs  Lab 02/10/20 1612 02/11/20 0235 02/11/20 1203 02/12/20 2243 02/13/20 0249  WBC 13.8* 12.8* 13.1* 14.2* 13.7*  CREATININE 5.13* 5.25* 5.49* 6.69* 6.81*    Estimated Creatinine Clearance: 11.6 mL/min (A) (by C-G formula based on SCr of 6.81 mg/dL (H)).    No Known Allergies  Antimicrobials this admission: 1/30 Zosyn x 1 1/30 vancomycin >>  1/30 cefepime >>  Dose adjustments this admission:  Microbiology results:  Thank you for allowing pharmacy to be a part of this patient's care.  Brain Hilts 02/13/2020 1:47 PM

## 2020-02-14 ENCOUNTER — Inpatient Hospital Stay (HOSPITAL_COMMUNITY): Payer: Medicare Other

## 2020-02-14 ENCOUNTER — Other Ambulatory Visit: Payer: Self-pay | Admitting: Physician Assistant

## 2020-02-14 DIAGNOSIS — L02611 Cutaneous abscess of right foot: Secondary | ICD-10-CM | POA: Diagnosis not present

## 2020-02-14 DIAGNOSIS — L03031 Cellulitis of right toe: Secondary | ICD-10-CM | POA: Diagnosis not present

## 2020-02-14 LAB — RENAL FUNCTION PANEL
Albumin: 2.8 g/dL — ABNORMAL LOW (ref 3.5–5.0)
Anion gap: 15 (ref 5–15)
BUN: 47 mg/dL — ABNORMAL HIGH (ref 8–23)
CO2: 24 mmol/L (ref 22–32)
Calcium: 8.9 mg/dL (ref 8.9–10.3)
Chloride: 94 mmol/L — ABNORMAL LOW (ref 98–111)
Creatinine, Ser: 6.46 mg/dL — ABNORMAL HIGH (ref 0.61–1.24)
GFR, Estimated: 8 mL/min — ABNORMAL LOW (ref >60)
Glucose, Bld: 143 mg/dL — ABNORMAL HIGH (ref 70–99)
Phosphorus: 5.2 mg/dL — ABNORMAL HIGH (ref 2.5–4.6)
Potassium: 3.8 mmol/L (ref 3.5–5.1)
Sodium: 133 mmol/L — ABNORMAL LOW (ref 135–145)

## 2020-02-14 LAB — CBC
HCT: 36.9 % — ABNORMAL LOW (ref 39.0–52.0)
Hemoglobin: 11 g/dL — ABNORMAL LOW (ref 13.0–17.0)
MCH: 26.4 pg (ref 26.0–34.0)
MCHC: 29.8 g/dL — ABNORMAL LOW (ref 30.0–36.0)
MCV: 88.7 fL (ref 80.0–100.0)
Platelets: 128 10*3/uL — ABNORMAL LOW (ref 150–400)
RBC: 4.16 MIL/uL — ABNORMAL LOW (ref 4.22–5.81)
RDW: 19.3 % — ABNORMAL HIGH (ref 11.5–15.5)
WBC: 14.3 10*3/uL — ABNORMAL HIGH (ref 4.0–10.5)
nRBC: 0 % (ref 0.0–0.2)

## 2020-02-14 LAB — GLUCOSE, CAPILLARY
Glucose-Capillary: 133 mg/dL — ABNORMAL HIGH (ref 70–99)
Glucose-Capillary: 150 mg/dL — ABNORMAL HIGH (ref 70–99)
Glucose-Capillary: 165 mg/dL — ABNORMAL HIGH (ref 70–99)

## 2020-02-14 MED ORDER — ENSURE SURGERY PO LIQD: 237.0000 mL | Freq: Two times a day (BID) | ORAL | Status: DC

## 2020-02-14 MED ORDER — CHLORHEXIDINE GLUCONATE 4 % EX LIQD
60.0000 mL | Freq: Once | CUTANEOUS | Status: AC
  Administered 2020-02-14: 4 via TOPICAL
  Filled 2020-02-14: qty 60

## 2020-02-14 MED ORDER — HEPARIN SODIUM (PORCINE) 1000 UNIT/ML IJ SOLN
INTRAMUSCULAR | Status: AC
  Filled 2020-02-14: qty 4

## 2020-02-14 MED ORDER — CHLORHEXIDINE GLUCONATE 4 % EX LIQD
60.0000 mL | Freq: Once | CUTANEOUS | Status: AC
  Administered 2020-02-15: 4 via TOPICAL

## 2020-02-14 MED ORDER — MORPHINE SULFATE (PF) 2 MG/ML IV SOLN
INTRAVENOUS | Status: AC
  Filled 2020-02-14: qty 1

## 2020-02-14 MED ORDER — CEFAZOLIN SODIUM-DEXTROSE 2-4 GM/100ML-% IV SOLN
2.0000 g | INTRAVENOUS | Status: AC
  Administered 2020-02-15: 2 g via INTRAVENOUS
  Filled 2020-02-14: qty 100

## 2020-02-14 MED ORDER — SODIUM CHLORIDE 0.9 % IV SOLN: INTRAVENOUS | Status: DC

## 2020-02-14 MED ORDER — ENSURE SURGERY PO LIQD
237.0000 mL | Freq: Once | ORAL | Status: AC
  Administered 2020-02-15: 237 mL via ORAL
  Filled 2020-02-14: qty 237

## 2020-02-14 MED ORDER — CEFAZOLIN SODIUM-DEXTROSE 2-4 GM/100ML-% IV SOLN: 2.0000 g | INTRAVENOUS | Status: DC

## 2020-02-14 NOTE — Progress Notes (Addendum)
[facility] Progress Note   Subjective:   Eating breakfast, reports some foot pain but otherwise feeling well. No SOB, CP, palpitations, dizziness, abdominal pain or nausea. Planned for TMA tomorrow.   Objective Vitals:   02/13/20 2020 02/13/20 2114 02/14/20 0100 02/14/20 0453  BP: (!) 109/58 103/64 (!) 109/57 (!) 102/51  Pulse: 85 96 85 79  Resp: 18 19 15 16   Temp: 98.1 F (36.7 C)  98.5 F (36.9 C) 98 F (36.7 C)  TempSrc: Oral  Oral Oral  SpO2: 98% 97% 100% 97%  Weight:    91.6 kg  Height:       Physical Exam General: Well developed male, alert and in NAD Heart: RRR, no mumurs, rubs or gallops Lungs: CTA bilaterally without wheezing, rhonchi or rales Abdomen: Soft, non-tender, non-distended, +BS Extremities: No edema b/l lower extremities Dialysis Access:  [facility] accessed   Additional Objective Labs: Basic Metabolic Panel: Recent Labs  Lab 02/11/20 0235 02/11/20 1203 02/12/20 2243 02/13/20 0249  NA 136  --  133* 136  K 4.5  --  4.0 3.8  CL 95*  --  97* 96*  CO2 28  --  21* 23  GLUCOSE 141*  --  87 137*  BUN 45*  --  59* 61*  CREATININE 5.25* 5.49* 6.69* 6.81*  CALCIUM 8.9  --  8.7* 9.0  PHOS  --   --  5.9*  --    Liver Function Tests: Recent Labs  Lab 02/11/20 0235 02/12/20 2243  AST 32  --   ALT 33  --   ALKPHOS 56  --   BILITOT 1.4*  --   PROT 6.0*  --   ALBUMIN 3.3* 2.8*   CBC: Recent Labs  Lab 02/10/20 1612 02/11/20 0235 02/11/20 1203 02/12/20 2243 02/13/20 0249  WBC 13.8* 12.8* 13.1* 14.2* 13.7*  NEUTROABS 4.5  --   --   --  4.0  HGB 12.0* 11.7* 11.9* 11.4* 10.9*  HCT 40.9 39.1 40.3 36.0* 36.4*  MCV 90.1 90.3 89.4 86.3 88.6  PLT 138* 127* 139* 129* 124*   Blood Culture    Component Value Date/Time   SDES BLOOD LEFT UPPER HAND 02/12/2020 2308   SDES BLOOD LEFT LOWER HAND 02/12/2020 2308   SPECREQUEST  02/12/2020 2308    BOTTLES DRAWN AEROBIC ONLY Blood Culture adequate volume   SPECREQUEST  02/12/2020 2308     BOTTLES DRAWN AEROBIC ONLY Blood Culture adequate volume   CULT  02/12/2020 2308    NO GROWTH < 12 HOURS Performed at [facility], [address] [place], [place] [address]    CULT  02/12/2020 2308    NO GROWTH < 12 HOURS Performed at [facility] [address] [place], [place] [address]    REPTSTATUS PENDING 02/12/2020 2308   REPTSTATUS PENDING 02/12/2020 2308   CBG: Recent Labs  Lab 02/12/20 1859 02/13/20 0015 02/13/20 1147 02/13/20 2108 02/14/20 0748  GLUCAP 99 80 113* 133* 133*    Studies/Results: PERIPHERAL VASCULAR CATHETERIZATION  Result Date: 02/12/2020 Patient name: Thomas Mcguire MRN: [id number] DOB: 07/06/1940 Sex: male 02/12/2020 Pre-operative Diagnosis: Right leg ulcers Post-operative diagnosis:  Same Surgeon:  Annamarie Major Procedure Performed:  1.  Ultrasound-guided access, left femoral artery  2.  Abdominal aortogram  3.  Right leg runoff  4.  Stent, right femoral-popliteal artery  5.  Stent, right tibioperoneal trunk  6.  Balloon angioplasty, right peroneal artery  7.  Conscious sedation, 138 minutes Indications: The  patient has previously undergone a right femoral to below-knee popliteal artery bypass graft with vein.  He has developed ischemic changes to his toes.  His bypass graft is presumed to be occluded.  He comes in today for angiography and possible intervention. Procedure:  The patient was identified in the holding area and taken to room 8.  The patient was then placed supine on the table and prepped and draped in the usual sterile fashion.  A time out was called.  Conscious sedation was administered with the use of IV fentanyl and Versed under continuous physician and nurse monitoring.  Heart rate, blood pressure, and oxygen saturation were continuously monitored.  Total sedation time was 138 minutes.  Ultrasound was used to evaluate the left common femoral artery.  It was patent .  A digital ultrasound image was acquired.  A micropuncture needle  was used to access the left common femoral artery under ultrasound guidance.  An 018 wire was advanced without resistance and a micropuncture sheath was placed.  The 018 wire was removed and a benson wire was placed.  The micropuncture sheath was exchanged for a 5 french sheath.  An omniflush catheter was advanced over the wire to the level of L-1.  An abdominal angiogram was obtained.  Next, using the omniflush catheter and a benson wire, the aortic bifurcation was crossed and the catheter was placed into theright external iliac artery and right runoff was obtained.  Findings:  Aortogram: No significant renal artery stenosis was identified.  No significant aortic stenosis identified.  No significant bilateral iliac stenosis was identified.  Right Lower Extremity: The right common femoral artery is widely patent.  The right profundofemoral artery is without stenosis.  The proximal portion of the superficial femoral artery is diseased but patent and then occludes in the mid thigh with reconstitution of the below-knee popliteal artery with single-vessel runoff.  The right femoral-popliteal bypass graft opacifies proximally but then occludes just beyond its origin.  Left Lower Extremity: Not evaluated due to procedural time and patient's difficulty to sit still Intervention: After the above images were acquired the decision was made to proceed with intervention.  A 6 French 45 cm sheath was advanced over the aortic bifurcation and placed into the right external iliac artery.  The patient was fully heparinized.  I tried to recanalize the right superficial femoral artery.  I used a Glidewire advantage and a quick cross catheter.  With significant difficulty, I was ultimately able to get the catheter wire down into the distal thigh.  I struggled trying to pass the catheter further past the joint space.  I switched out to a glide catheter and tried multiple wires.  Ultimately, I was not ever able to reenter into the  below-knee popliteal artery.  I had created a large dissection plane which precluded further attempts at recanalization. Given that the patient had very little surgical options, I felt trying to recanalize his bypass graft was a reasonable option.  The wire was then directed into the proximal bypass graft.  Using the Glidewire advantage and a quick cross catheter I was ultimately able to cross through the bypass graft and get wire access into the below-knee popliteal artery.  This was confirmed with a contrast injection.  I then tried to advance a 5 x 100 INNOVA stent to cross the distal anastomosis however this would not pass.  The stent was removed and then a 4 x 100 balloon was inserted.  Balloon angioplasty across the distal anastomosis  was performed and then I reinserted the 5 x 100 stent.  My intent was to deploy the stent into the below-knee popliteal artery, however upon deployment the stent jumped forward into the tibioperoneal trunk.  I then elected to extend approximately it with overlapping 5 x 250 Viabahn stents.  2 were palced, with approximately 1 cm overlap and landing at the origin of the bypass graft.  The stents were then molded with a 5 mm balloon.  Follow-up imaging revealed a widely patent bypass graft.  There did appear to be occlusion of the peroneal artery at this time.  I then inserted a 3 x 100 Sterling balloon and perform balloon angioplasty of the peroneal artery.  After 2 inflations of the balloon in the peroneal artery a completion arteriogram was performed.  This showed inline flow through the bypass graft and into the peroneal artery which reconstitutes the posterior tibial across the ankle.  At this point, I elected to terminate the procedure.  The sheath was withdrawn and the patient be taken to the holding area for sheath pull. Impression:  #1  Occluded right superficial femoral and right femoral-popliteal bypass graft  #2  Failed attempt at recanalization of the native right  superficial femoral and popliteal artery  #3  Successful recanalization of the right femoral below-knee popliteal artery bypass graft.  A 5 x 100 INNOVA stent was placed beginning in the tibioperoneal trunk extending up into the bypass graft.  I then placed 2 overlapping 5x250 Viabahn stents within the vein graft.  Following the procedure, the patient had inline flow through the recanalized bypass graft and into the peroneal artery which perfuses the foot. Theotis Burrow, M.D., [facility] Office: [phone] Pager:  [phone]  Medications: . ceFEPime (MAXIPIME) IV 1 g (02/13/20 1729)  . vancomycin     . calcitRIOL  0.25 mcg Oral Q M,W,F  . Chlorhexidine Gluconate Cloth  6 each Topical Q0600  . cyanocobalamin  1,000 mcg Intramuscular Q30 days  . folic acid  1 mg Oral Daily  . heparin  5,000 Units Subcutaneous Q8H  . insulin aspart  0-15 Units Subcutaneous TID WC  . insulin aspart  0-5 Units Subcutaneous QHS  . midodrine  10 mg Oral Q T,Th,Sa-HD  . multivitamin  1 tablet Oral QHS  . pravastatin  40 mg Oral QHS    Dialysis Orders: Center:[facility]. 180NRe, 4 hours, BFR 400, DFR 800, EDW 85kg, 2K/2.5Ca, TDC, no heparin No ESA Calcitriol 0.75 mcg PO q HD  Assessment/Plan: 1. Gangrene of R foot/PAD: VVS following, s/p arteriogram on 02/13/20. Planned for TMA tomorrow AM. Antibiotics per primary team. 2. ESRD:TTS schedule, off schedule yesterday due to procedure late last night. Resume TTS schedule today.  Dialysis treatments are currently shortened due to high census/nursing shortage. 3. Hypotension/volume:BP controlled, edema improved. Continue midodrine prior to HD. UF goal 2L with HD as tolerated 4. Anemia:Hgb at goal. No ESA indicated at this time. 5. Metabolic bone disease:Calcium controlled. Phos 5.9. Continue calcitriol. No binder on outpt med list. If phosphorus remains high will need to start a non-calcium based binder.   6. T2DM:Mgt per primary team 7. Thrombocytopenia: Stable, no heparin with HD  Anice Paganini, PA-C 02/14/2020, 8:49 AM  [facility] Pager: [phone]  I have seen and examined this patient and agree with plan and assessment in the above note with renal recommendations/intervention highlighted.  To get back on regular schedule today and surgery tomorrow.  Broadus John  A Jeffory Snelgrove,MD 02/14/2020 12:37 PM

## 2020-02-14 NOTE — Anesthesia Preprocedure Evaluation (Addendum)
Anesthesia Evaluation  Patient identified by MRN, date of birth, ID band Patient awake    Reviewed: Allergy & Precautions, NPO status , Patient's Chart, lab work & pertinent test results  History of Anesthesia Complications Negative for: history of anesthetic complications  Airway Mallampati: II  TM Distance: >3 FB Neck ROM: Full    Dental  (+) Edentulous Upper, Edentulous Lower   Pulmonary neg pulmonary ROS, former smoker,    Pulmonary exam normal        Cardiovascular hypertension, Pt. on medications and Pt. 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, plts 128   Anesthesia Other Findings  Echo 11/08/19: EF 35-40%, global LV hypokinesis, g2dd, normal RV function, mild PAH (RVSP 43), mod MR, mild AR, mild aortic root dilatation (40 mm)  Reproductive/Obstetrics                            Anesthesia Physical  Anesthesia Plan  ASA: IV  Anesthesia Plan: General   Post-op Pain Management:    Induction: Intravenous  PONV Risk Score and Plan: 2 and Ondansetron, Dexamethasone, Midazolam and Treatment may vary due to age or medical condition  Airway Management Planned: LMA  Additional Equipment: None  Intra-op Plan:   Post-operative Plan: Extubation in OR  Informed Consent: I have reviewed the patients History and Physical, chart, labs and discussed the procedure including the risks, benefits and alternatives for the proposed anesthesia with the patient or authorized representative who has indicated his/her  understanding and acceptance.     Dental advisory given  Plan Discussed with:   Anesthesia Plan Comments:        Anesthesia Quick Evaluation

## 2020-02-14 NOTE — Progress Notes (Signed)
[facility]  Subjective  - no complaints.  States his right foot feels better.   Objective (!) 110/53 86 98.3 F (36.8 C) (Oral) 16 96% No intake or output data in the 24 hours ending 02/14/20 1102  Right foot warm, gangrene toes 2-5   Laboratory Lab Results: Recent Labs    02/12/20 2243 02/13/20 0249  WBC 14.2* 13.7*  HGB 11.4* 10.9*  HCT 36.0* 36.4*  PLT 129* 124*   BMET Recent Labs    02/12/20 2243 02/13/20 0249  NA 133* 136  K 4.0 3.8  CL 97* 96*  CO2 21* 23  GLUCOSE 87 137*  BUN 59* 61*  CREATININE 6.69* 6.81*  CALCIUM 8.7* 9.0    COAG Lab Results  Component Value Date   INR 1.4 (H) 11/08/2019   INR 1.2 07/11/2018   INR 1.18 08/24/2011   No results found for: PTT  Assessment/Planning:  80 yo M s/p revascularization of occluded right leg bypass by Dr. Trula Slade.  TMA with Dr. Sharol Given tomorrow for tissue loss.  Left groin access site looks good.  Appreciate his help.  [name] 02/14/2020 11:02 AM --

## 2020-02-14 NOTE — Progress Notes (Addendum)
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  He was found to have gangrene of the second, third and fourth right toes.  On presentation he was hemodynamically stable.  X-rays of the foot showed no evidence of osteomyelitis but large linear metallic foreign body in the plantar soft tissue of the foot and on the mid first metatarsal.  Started on broad spectrum antibiotics.  Patient was transferred from [facility] to [facility] for  continuation of dialysis.  He underwent  lower extremity arteriogram with  intervention on 02/12/20.  Nephrology following for dialysis.  Dr. Sharol Given planning for right transmetatarsal amputation tomorrow.  Assessment & Plan:   Principal Problem:   Cellulitis and abscess of toe of right foot Active Problems:   Chronic lymphocytic leukemia (CLL), B-cell (HCC)   PAD (peripheral artery disease) (HCC)   CKD (chronic kidney disease)   DM2 (diabetes mellitus, type 2) (HCC)   Cellulitis of right foot   Gangrene (HCC)   Gangrene of the right foot: Presented with discoloration, swelling of the right foot.  X-ray did not show  any evidence of osteomyelitis.  Vascular surgery following.  Underwent  lower extremity angiogram stenting of  right femoral-popliteal artery ,  right tibioperoneal trunk and Balloon angioplasty on 02/12/20.  Continue current  antibiotics.  Follow-up cultures.  Currently is afebrile, hemodynamically stable.  Has mild leukocytosis. Dr. Sharol Given planning for right  transmetatarsal amputation tomorrow.  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  We will continue his home medications on discharge.  ESRD on dialysis: Dialyzed on TTS schedule.  Nephrology following for dialysis  Diabetes type 2: Recent hemoglobin C of 6.1.  Monitor blood sugars.  Continue current regimen  Normocytic anemia: Secondary to ESRD.  Continue to monitor.  Currently hemoglobin stable.  History of CLL: Follows with Dr. Learta Codding.  Outpatient follow-up recommended  Thrombocytopenia: Continue to monitor.  Stable  Chronic hypotension: On midodrine  Hyperlipidemia: On Pravachol         DVT prophylaxis:Heparin [place] Code Status: Full Family Communication: Called wife for update today Status is: Inpatient  Remains inpatient appropriate because:Inpatient level of care appropriate due to severity of illness   Dispo: The patient is from: Home              Anticipated d/c is to: Home              Anticipated d/c date is: 2-3 days              Patient currently is not medically stable to d/c.   Difficult to place patient No     Consultants: Vascular surgery, nephrology  Procedures: as above, dialysis  Antimicrobials:  Anti-infectives (From admission, onward)   Start     Dose/Rate Route Frequency Ordered Stop   02/14/20 1200  vancomycin (VANCOREADY) IVPB 500 mg/100 mL        500 mg 100 mL/hr over 60 Minutes Intravenous Every T-Th-Sa (Hemodialysis)  02/13/20 1347     02/13/20 1445  vancomycin (VANCOREADY) IVPB 500 mg/100 mL        500 mg 100 mL/hr over 60 Minutes Intravenous  Once 02/13/20 1346 02/13/20 1718   02/12/20 1200  vancomycin (VANCOCIN) IVPB 1000 mg/200 mL premix  Status:  Discontinued        1,000 mg 200 mL/hr over 60 Minutes Intravenous Every T-Th-Sa (Hemodialysis) 02/10/20 1904 02/13/20 1347   02/11/20 0000  ceFEPIme (MAXIPIME) 1 g in  sodium chloride 0.9 % 100 mL IVPB        1 g 200 mL/hr over 30 Minutes Intravenous Daily-1800 02/10/20 1904     02/10/20 1630  vancomycin (VANCOREADY) IVPB 2000 mg/400 mL        2,000 mg 200 mL/hr over 120 Minutes Intravenous STAT 02/10/20 1619 02/10/20 1929   02/10/20 1630  piperacillin-tazobactam (ZOSYN) IVPB 3.375 g        3.375 g 100 mL/hr over 30 Minutes Intravenous STAT 02/10/20 1619 02/10/20 1707      Subjective:  Patient seen and examined the bedside this morning.  Hemodynamically stable.  Denies any complaints  Objective: Vitals:   02/13/20 2020 02/13/20 2114 02/14/20 0100 02/14/20 0453  BP: (!) 109/58 103/64 (!) 109/57 (!) 102/51  Pulse: 85 96 85 79  Resp: 18 19 15 16   Temp: 98.1 F (36.7 C)  98.5 F (36.9 C) 98 F (36.7 C)  TempSrc: Oral  Oral Oral  SpO2: 98% 97% 100% 97%  Weight:    91.6 kg  Height:        Intake/Output Summary (Last 24 hours) at 02/14/2020 0827 Last data filed at 02/13/2020 1610 Gross per 24 hour  Intake --  Output 1720 ml  Net -1720 ml   Filed Weights   02/13/20 0715 02/13/20 0951 02/14/20 0453  Weight: 91.6 kg 89.9 kg 91.6 kg    Examination:  General exam: Comfortable, very pleasant gentleman Respiratory system: Bilateral equal air entry, normal vesicular breath sounds, no wheezes or crackles  Cardiovascular system: S1 & S2 heard, RRR. No JVD, murmurs, rubs, gallops or clicks. Gastrointestinal system: Abdomen is nondistended, soft and nontender. No organomegaly or masses felt. Normal bowel sounds heard. Central nervous system: Alert and oriented. No focal neurological deficits. Extremities: No edema, no clubbing       Data Reviewed: I have personally reviewed following labs and imaging studies  CBC: Recent Labs  Lab 02/10/20 1612 02/11/20 0235 02/11/20 1203 02/12/20 2243 02/13/20 0249  WBC 13.8* 12.8* 13.1* 14.2* 13.7*  NEUTROABS 4.5  --   --   --  4.0  HGB 12.0* 11.7* 11.9* 11.4* 10.9*  HCT 40.9 39.1 40.3 36.0* 36.4*   MCV 90.1 90.3 89.4 86.3 88.6  PLT 138* 127* 139* 129* 960*   Basic Metabolic Panel: Recent Labs  Lab 02/10/20 1612 02/11/20 0235 02/11/20 1203 02/12/20 2243 02/13/20 0249  NA 139 136  --  133* 136  K 3.8 4.5  --  4.0 3.8  CL 94* 95*  --  97* 96*  CO2 32 28  --  21* 23  GLUCOSE 171* 141*  --  87 137*  BUN 45* 45*  --  59* 61*  CREATININE 5.13* 5.25* 5.49* 6.69* 6.81*  CALCIUM 9.3 8.9  --  8.7* 9.0  PHOS  --   --   --  5.9*  --    GFR: Estimated Creatinine Clearance: 11.8 mL/min (A) (by C-G formula based on SCr of 6.81 mg/dL (H)). Liver Function  Tests: Recent Labs  Lab 02/11/20 0235 02/12/20 2243  AST 32  --   ALT 33  --   ALKPHOS 56  --   BILITOT 1.4*  --   PROT 6.0*  --   ALBUMIN 3.3* 2.8*   No results for input(s): LIPASE, AMYLASE in the last 168 hours. No results for input(s): AMMONIA in the last 168 hours. Coagulation Profile: No results for input(s): INR, PROTIME in the last 168 hours. Cardiac Enzymes: No results for input(s): CKTOTAL, CKMB, CKMBINDEX, TROPONINI in the last 168 hours. BNP (last 3 results) No results for input(s): PROBNP in the last 8760 hours. HbA1C: No results for input(s): HGBA1C in the last 72 hours. CBG: Recent Labs  Lab 02/12/20 1859 02/13/20 0015 02/13/20 1147 02/13/20 2108 02/14/20 0748  GLUCAP 99 80 113* 133* 133*   Lipid Profile: No results for input(s): CHOL, HDL, LDLCALC, TRIG, CHOLHDL, LDLDIRECT in the last 72 hours. Thyroid Function Tests: No results for input(s): TSH, T4TOTAL, FREET4, T3FREE, THYROIDAB in the last 72 hours. Anemia Panel: No results for input(s): VITAMINB12, FOLATE, FERRITIN, TIBC, IRON, RETICCTPCT in the last 72 hours. Sepsis Labs: No results for input(s): PROCALCITON, LATICACIDVEN in the last 168 hours.  Recent Results (from the past 240 hour(s))  SARS Coronavirus 2 by RT PCR (hospital order, performed in [place] hospital lab)     Status: None   Collection Time: 02/10/20  7:00 PM  Result  Value Ref Range Status   SARS Coronavirus 2 NEGATIVE NEGATIVE Final    Comment: (NOTE) SARS-CoV-2 target nucleic acids are NOT DETECTED.  The SARS-CoV-2 RNA is generally detectable in upper and lower respiratory specimens during the acute phase of infection. The lowest concentration of SARS-CoV-2 viral copies this assay can detect is 250 copies / mL. A negative result does not preclude SARS-CoV-2 infection and should not be used as the sole basis for treatment or other patient management decisions.  A negative result may occur with improper specimen collection / handling, submission of specimen other than nasopharyngeal swab, presence of viral mutation(s) within the areas targeted by this assay, and inadequate number of viral copies (<250 copies / mL). A negative result must be combined with clinical observations, patient history, and epidemiological information.  Fact Sheet for Patients:   StrictlyIdeas.no  Fact Sheet for Healthcare Providers: BankingDealers.co.za  This test is not yet approved or  cleared by the [place] FDA and has been authorized for detection and/or diagnosis of SARS-CoV-2 by FDA under an Emergency Use Authorization (EUA).  This EUA will remain in effect (meaning this test can be used) for the duration of the COVID-19 declaration under Section 564(b)(1) of the Act, 21 U.S.C. section 360bbb-3(b)(1), unless the authorization is terminated or revoked sooner.  Performed at [facility], [place] [address], [place], [place] [address]   MRSA PCR Screening     Status: None   Collection Time: 02/11/20 11:07 PM   Specimen: Nasopharyngeal  Result Value Ref Range Status   MRSA by PCR NEGATIVE NEGATIVE Final    Comment:        The GeneXpert MRSA Assay (FDA approved for NASAL specimens only), is one component of a comprehensive MRSA colonization surveillance program. It is not intended to diagnose  MRSA infection nor to guide or monitor treatment for MRSA infections. Performed at [facility], [place] [address] [place], [place] [address]   Culture, blood (routine x 2)     Status: None (Preliminary result)   Collection  Time: 02/12/20 11:08 PM   Specimen: BLOOD  Result Value Ref Range Status   Specimen Description BLOOD LEFT UPPER HAND  Final   Special Requests   Final    BOTTLES DRAWN AEROBIC ONLY Blood Culture adequate volume   Culture   Final    NO GROWTH < 12 HOURS Performed at [facility], [address], [place], [place] [address]    Report Status PENDING  Incomplete  Culture, blood (routine x 2)     Status: None (Preliminary result)   Collection Time: 02/12/20 11:08 PM   Specimen: BLOOD  Result Value Ref Range Status   Specimen Description BLOOD LEFT LOWER HAND  Final   Special Requests   Final    BOTTLES DRAWN AEROBIC ONLY Blood Culture adequate volume   Culture   Final    NO GROWTH < 12 HOURS Performed at [facility], [place] [address], [place], [place] [address]    Report Status PENDING  Incomplete         Radiology Studies: PERIPHERAL VASCULAR CATHETERIZATION  Result Date: 02/12/2020 Patient name: VENSON FERENCZ MRN: [id number] DOB: September 11, 1940 Sex: male 02/12/2020 Pre-operative Diagnosis: Right leg ulcers Post-operative diagnosis:  Same Surgeon:  Annamarie Major Procedure Performed:  1.  Ultrasound-guided access, left femoral artery  2.  Abdominal aortogram  3.  Right leg runoff  4.  Stent, right femoral-popliteal artery  5.  Stent, right tibioperoneal trunk  6.  Balloon angioplasty, right peroneal artery  7.  Conscious sedation, 138 minutes Indications: The patient has previously undergone a right femoral to below-knee popliteal artery bypass graft with vein.  He has developed ischemic changes to his toes.  His bypass graft is presumed to be occluded.  He comes in today for angiography and possible intervention. Procedure:  The patient was identified  in the holding area and taken to room 8.  The patient was then placed supine on the table and prepped and draped in the usual sterile fashion.  A time out was called.  Conscious sedation was administered with the use of IV fentanyl and Versed under continuous physician and nurse monitoring.  Heart rate, blood pressure, and oxygen saturation were continuously monitored.  Total sedation time was 138 minutes.  Ultrasound was used to evaluate the left common femoral artery.  It was patent .  A digital ultrasound image was acquired.  A micropuncture needle was used to access the left common femoral artery under ultrasound guidance.  An 018 wire was advanced without resistance and a micropuncture sheath was placed.  The 018 wire was removed and a benson wire was placed.  The micropuncture sheath was exchanged for a 5 french sheath.  An omniflush catheter was advanced over the wire to the level of L-1.  An abdominal angiogram was obtained.  Next, using the omniflush catheter and a benson wire, the aortic bifurcation was crossed and the catheter was placed into theright external iliac artery and right runoff was obtained.  Findings:  Aortogram: No significant renal artery stenosis was identified.  No significant aortic stenosis identified.  No significant bilateral iliac stenosis was identified.  Right Lower Extremity: The right common femoral artery is widely patent.  The right profundofemoral artery is without stenosis.  The proximal portion of the superficial femoral artery is diseased but patent and then occludes in the mid thigh with reconstitution of the below-knee popliteal artery with single-vessel runoff.  The right femoral-popliteal bypass graft opacifies proximally but then occludes just beyond  its origin.  Left Lower Extremity: Not evaluated due to procedural time and patient's difficulty to sit still Intervention: After the above images were acquired the decision was made to proceed with intervention.  A 6  French 45 cm sheath was advanced over the aortic bifurcation and placed into the right external iliac artery.  The patient was fully heparinized.  I tried to recanalize the right superficial femoral artery.  I used a Glidewire advantage and a quick cross catheter.  With significant difficulty, I was ultimately able to get the catheter wire down into the distal thigh.  I struggled trying to pass the catheter further past the joint space.  I switched out to a glide catheter and tried multiple wires.  Ultimately, I was not ever able to reenter into the below-knee popliteal artery.  I had created a large dissection plane which precluded further attempts at recanalization. Given that the patient had very little surgical options, I felt trying to recanalize his bypass graft was a reasonable option.  The wire was then directed into the proximal bypass graft.  Using the Glidewire advantage and a quick cross catheter I was ultimately able to cross through the bypass graft and get wire access into the below-knee popliteal artery.  This was confirmed with a contrast injection.  I then tried to advance a 5 x 100 INNOVA stent to cross the distal anastomosis however this would not pass.  The stent was removed and then a 4 x 100 balloon was inserted.  Balloon angioplasty across the distal anastomosis was performed and then I reinserted the 5 x 100 stent.  My intent was to deploy the stent into the below-knee popliteal artery, however upon deployment the stent jumped forward into the tibioperoneal trunk.  I then elected to extend approximately it with overlapping 5 x 250 Viabahn stents.  2 were palced, with approximately 1 cm overlap and landing at the origin of the bypass graft.  The stents were then molded with a 5 mm balloon.  Follow-up imaging revealed a widely patent bypass graft.  There did appear to be occlusion of the peroneal artery at this time.  I then inserted a 3 x 100 Sterling balloon and perform balloon angioplasty  of the peroneal artery.  After 2 inflations of the balloon in the peroneal artery a completion arteriogram was performed.  This showed inline flow through the bypass graft and into the peroneal artery which reconstitutes the posterior tibial across the ankle.  At this point, I elected to terminate the procedure.  The sheath was withdrawn and the patient be taken to the holding area for sheath pull. Impression:  #1  Occluded right superficial femoral and right femoral-popliteal bypass graft  #2  Failed attempt at recanalization of the native right superficial femoral and popliteal artery  #3  Successful recanalization of the right femoral below-knee popliteal artery bypass graft.  A 5 x 100 INNOVA stent was placed beginning in the tibioperoneal trunk extending up into the bypass graft.  I then placed 2 overlapping 5x250 Viabahn stents within the vein graft.  Following the procedure, the patient had inline flow through the recanalized bypass graft and into the peroneal artery which perfuses the foot. Theotis Burrow, M.D., [facility] [place] Office: [phone] Pager:  [phone]       Scheduled Meds: . calcitRIOL  0.25 mcg Oral Q M,W,F  . Chlorhexidine Gluconate Cloth  6 each Topical Q0600  . cyanocobalamin  1,000 mcg Intramuscular Q30  days  . folic acid  1 mg Oral Daily  . heparin  5,000 Units Subcutaneous Q8H  . insulin aspart  0-15 Units Subcutaneous TID WC  . insulin aspart  0-5 Units Subcutaneous QHS  . midodrine  10 mg Oral Q T,Th,Sa-HD  . multivitamin  1 tablet Oral QHS  . pravastatin  40 mg Oral QHS   Continuous Infusions: . ceFEPime (MAXIPIME) IV 1 g (02/13/20 1729)  . vancomycin       LOS: 4 days    Time spent: 25 mins.More than 50% of that time was spent in counseling and/or coordination of care.      Shelly Coss, MD [facility] P2/03/2020, 8:27 AM

## 2020-02-15 ENCOUNTER — Inpatient Hospital Stay (HOSPITAL_COMMUNITY): Admission: RE | Admit: 2020-02-15 | Payer: Medicare Other | Source: Ambulatory Visit

## 2020-02-15 ENCOUNTER — Inpatient Hospital Stay (HOSPITAL_COMMUNITY): Payer: Medicare Other | Admitting: Anesthesiology

## 2020-02-15 ENCOUNTER — Encounter (HOSPITAL_COMMUNITY)
Admission: EM | Disposition: A | Discharge status: Skilled Nursing Facility | Payer: Self-pay | Source: Home / Self Care | Attending: Internal Medicine

## 2020-02-15 ENCOUNTER — Telehealth: Payer: Self-pay | Admitting: Physician Assistant

## 2020-02-15 ENCOUNTER — Ambulatory Visit: Payer: Medicare Other

## 2020-02-15 ENCOUNTER — Encounter (HOSPITAL_COMMUNITY): Payer: Self-pay | Admitting: Internal Medicine

## 2020-02-15 DIAGNOSIS — L03031 Cellulitis of right toe: Secondary | ICD-10-CM | POA: Diagnosis not present

## 2020-02-15 DIAGNOSIS — I96 Gangrene, not elsewhere classified: Secondary | ICD-10-CM | POA: Diagnosis not present

## 2020-02-15 DIAGNOSIS — L02611 Cutaneous abscess of right foot: Secondary | ICD-10-CM | POA: Diagnosis not present

## 2020-02-15 DIAGNOSIS — I739 Peripheral vascular disease, unspecified: Secondary | ICD-10-CM | POA: Diagnosis not present

## 2020-02-15 HISTORY — PX: AMPUTATION: SHX166

## 2020-02-15 LAB — BASIC METABOLIC PANEL
Anion gap: 15 (ref 5–15)
BUN: 29 mg/dL — ABNORMAL HIGH (ref 8–23)
CO2: 24 mmol/L (ref 22–32)
Calcium: 9.1 mg/dL (ref 8.9–10.3)
Chloride: 96 mmol/L — ABNORMAL LOW (ref 98–111)
Creatinine, Ser: 4.98 mg/dL — ABNORMAL HIGH (ref 0.61–1.24)
GFR, Estimated: 11 mL/min — ABNORMAL LOW (ref >60)
Glucose, Bld: 118 mg/dL — ABNORMAL HIGH (ref 70–99)
Potassium: 4.2 mmol/L (ref 3.5–5.1)
Sodium: 135 mmol/L (ref 135–145)

## 2020-02-15 LAB — SURGICAL PCR SCREEN
MRSA, PCR: NEGATIVE
Staphylococcus aureus: NEGATIVE

## 2020-02-15 LAB — CBC WITH DIFFERENTIAL/PLATELET
Abs Immature Granulocytes: 0.09 10*3/uL — ABNORMAL HIGH (ref 0.00–0.07)
Basophils Absolute: 0 10*3/uL (ref 0.0–0.1)
Basophils Relative: 0 %
Eosinophils Absolute: 0.1 10*3/uL (ref 0.0–0.5)
Eosinophils Relative: 0 %
HCT: 37.7 % — ABNORMAL LOW (ref 39.0–52.0)
Hemoglobin: 11.3 g/dL — ABNORMAL LOW (ref 13.0–17.0)
Immature Granulocytes: 1 %
Lymphocytes Relative: 62 %
Lymphs Abs: 9.7 10*3/uL — ABNORMAL HIGH (ref 0.7–4.0)
MCH: 26.5 pg (ref 26.0–34.0)
MCHC: 30 g/dL (ref 30.0–36.0)
MCV: 88.5 fL (ref 80.0–100.0)
Monocytes Absolute: 2.3 10*3/uL — ABNORMAL HIGH (ref 0.1–1.0)
Monocytes Relative: 15 %
Neutro Abs: 3.5 10*3/uL (ref 1.7–7.7)
Neutrophils Relative %: 22 %
Platelets: 117 10*3/uL — ABNORMAL LOW (ref 150–400)
RBC: 4.26 MIL/uL (ref 4.22–5.81)
RDW: 19.2 % — ABNORMAL HIGH (ref 11.5–15.5)
WBC: 15.7 10*3/uL — ABNORMAL HIGH (ref 4.0–10.5)
nRBC: 0.2 % (ref 0.0–0.2)

## 2020-02-15 LAB — GLUCOSE, CAPILLARY
Glucose-Capillary: 138 mg/dL — ABNORMAL HIGH (ref 70–99)
Glucose-Capillary: 141 mg/dL — ABNORMAL HIGH (ref 70–99)
Glucose-Capillary: 119 mg/dL — ABNORMAL HIGH (ref 70–99)
Glucose-Capillary: 176 mg/dL — ABNORMAL HIGH (ref 70–99)
Glucose-Capillary: 141 mg/dL — ABNORMAL HIGH (ref 70–99)

## 2020-02-15 SURGERY — AMPUTATION, FOOT, RAY
Anesthesia: General | Site: Foot | Laterality: Right

## 2020-02-15 MED ORDER — ONDANSETRON HCL 4 MG/2ML IJ SOLN
INTRAMUSCULAR | Status: AC
  Filled 2020-02-15: qty 2

## 2020-02-15 MED ORDER — CHLORHEXIDINE GLUCONATE 4 % EX LIQD
CUTANEOUS | Status: AC
  Filled 2020-02-15: qty 15

## 2020-02-15 MED ORDER — HYDROMORPHONE HCL 1 MG/ML IJ SOLN
0.5000 mg | INTRAMUSCULAR | Status: DC | PRN
  Administered 2020-02-15 – 2020-02-18 (×11): 0.5 mg via INTRAVENOUS
  Filled 2020-02-15 (×10): qty 1

## 2020-02-15 MED ORDER — SODIUM CHLORIDE 0.9 % IV SOLN: INTRAVENOUS | Status: DC

## 2020-02-15 MED ORDER — VASOPRESSIN 20 UNIT/ML IV SOLN
INTRAVENOUS | Status: DC | PRN
  Administered 2020-02-15: 2 [IU] via INTRAVENOUS

## 2020-02-15 MED ORDER — FENTANYL CITRATE (PF) 100 MCG/2ML IJ SOLN
25.0000 ug | INTRAMUSCULAR | Status: DC | PRN
  Administered 2020-02-15: 50 ug via INTRAVENOUS

## 2020-02-15 MED ORDER — OXYCODONE HCL 5 MG PO TABS: 5.0000 mg | ORAL_TABLET | Freq: Once | ORAL | Status: DC | PRN

## 2020-02-15 MED ORDER — METOCLOPRAMIDE HCL 5 MG PO TABS: 5.0000 mg | ORAL_TABLET | Freq: Three times a day (TID) | ORAL | Status: DC | PRN

## 2020-02-15 MED ORDER — OXYCODONE HCL 5 MG/5ML PO SOLN: 5.0000 mg | Freq: Once | ORAL | Status: DC | PRN

## 2020-02-15 MED ORDER — CLOPIDOGREL BISULFATE 75 MG PO TABS
75.0000 mg | ORAL_TABLET | Freq: Every day | ORAL | Status: DC
  Administered 2020-02-15 – 2020-02-20 (×6): 75 mg via ORAL
  Filled 2020-02-15 (×6): qty 1

## 2020-02-15 MED ORDER — LIDOCAINE 2% (20 MG/ML) 5 ML SYRINGE
INTRAMUSCULAR | Status: AC
  Filled 2020-02-15: qty 5

## 2020-02-15 MED ORDER — FENTANYL CITRATE (PF) 100 MCG/2ML IJ SOLN
INTRAMUSCULAR | Status: AC
  Filled 2020-02-15: qty 2

## 2020-02-15 MED ORDER — OXYCODONE HCL 5 MG PO TABS
5.0000 mg | ORAL_TABLET | ORAL | Status: DC | PRN
  Administered 2020-02-19 – 2020-02-20 (×2): 10 mg via ORAL
  Administered 2020-02-20: 5 mg via ORAL
  Filled 2020-02-15: qty 2
  Filled 2020-02-15: qty 1
  Filled 2020-02-15: qty 2

## 2020-02-15 MED ORDER — ASPIRIN EC 81 MG PO TBEC
81.0000 mg | DELAYED_RELEASE_TABLET | Freq: Every day | ORAL | Status: DC
  Administered 2020-02-15 – 2020-02-20 (×6): 81 mg via ORAL
  Filled 2020-02-15 (×6): qty 1

## 2020-02-15 MED ORDER — FENTANYL CITRATE (PF) 250 MCG/5ML IJ SOLN
INTRAMUSCULAR | Status: AC
  Filled 2020-02-15: qty 5

## 2020-02-15 MED ORDER — HEPARIN SODIUM (PORCINE) 5000 UNIT/ML IJ SOLN
5000.0000 [IU] | Freq: Three times a day (TID) | INTRAMUSCULAR | Status: DC
  Administered 2020-02-16 – 2020-02-20 (×14): 5000 [IU] via SUBCUTANEOUS
  Filled 2020-02-15 (×13): qty 1

## 2020-02-15 MED ORDER — CHLORHEXIDINE GLUCONATE CLOTH 2 % EX PADS
6.0000 | MEDICATED_PAD | Freq: Every day | CUTANEOUS | Status: DC
  Administered 2020-02-16 – 2020-02-19 (×2): 6 via TOPICAL

## 2020-02-15 MED ORDER — METOCLOPRAMIDE HCL 5 MG/ML IJ SOLN: 5.0000 mg | Freq: Three times a day (TID) | INTRAMUSCULAR | Status: DC | PRN

## 2020-02-15 MED ORDER — ONDANSETRON HCL 4 MG/2ML IJ SOLN
INTRAMUSCULAR | Status: DC | PRN
  Administered 2020-02-15: 4 mg via INTRAVENOUS

## 2020-02-15 MED ORDER — LIDOCAINE 2% (20 MG/ML) 5 ML SYRINGE
INTRAMUSCULAR | Status: DC | PRN
  Administered 2020-02-15: 60 mg via INTRAVENOUS

## 2020-02-15 MED ORDER — VASOPRESSIN 20 UNIT/ML IV SOLN
INTRAVENOUS | Status: AC
  Filled 2020-02-15: qty 1

## 2020-02-15 MED ORDER — SUCCINYLCHOLINE CHLORIDE 200 MG/10ML IV SOSY
PREFILLED_SYRINGE | INTRAVENOUS | Status: AC
  Filled 2020-02-15: qty 10

## 2020-02-15 MED ORDER — 0.9 % SODIUM CHLORIDE (POUR BTL) OPTIME
TOPICAL | Status: DC | PRN
  Administered 2020-02-15: 1000 mL

## 2020-02-15 MED ORDER — DOCUSATE SODIUM 100 MG PO CAPS
100.0000 mg | ORAL_CAPSULE | Freq: Two times a day (BID) | ORAL | Status: DC
  Administered 2020-02-15 – 2020-02-20 (×9): 100 mg via ORAL
  Filled 2020-02-15 (×11): qty 1

## 2020-02-15 MED ORDER — PROPOFOL 10 MG/ML IV BOLUS
INTRAVENOUS | Status: DC | PRN
  Administered 2020-02-15: 120 mg via INTRAVENOUS
  Administered 2020-02-15: 30 mg via INTRAVENOUS

## 2020-02-15 MED ORDER — ONDANSETRON HCL 4 MG/2ML IJ SOLN: 4.0000 mg | Freq: Once | INTRAMUSCULAR | Status: DC | PRN

## 2020-02-15 MED ORDER — PHENYLEPHRINE 40 MCG/ML (10ML) SYRINGE FOR IV PUSH (FOR BLOOD PRESSURE SUPPORT)
PREFILLED_SYRINGE | INTRAVENOUS | Status: DC | PRN
  Administered 2020-02-15 (×2): 120 ug via INTRAVENOUS
  Administered 2020-02-15 (×2): 80 ug via INTRAVENOUS
  Administered 2020-02-15: 120 ug via INTRAVENOUS

## 2020-02-15 MED ORDER — PROPOFOL 10 MG/ML IV BOLUS
INTRAVENOUS | Status: AC
  Filled 2020-02-15: qty 20

## 2020-02-15 SURGICAL SUPPLY — 29 items
BLADE SAW SGTL MED 73X18.5 STR (BLADE) ×1 IMPLANT
BLADE SURG 21 STRL SS (BLADE) ×2 IMPLANT
BNDG COHESIVE 4X5 TAN STRL (GAUZE/BANDAGES/DRESSINGS) ×2 IMPLANT
BNDG COHESIVE 6X5 TAN NS LF (GAUZE/BANDAGES/DRESSINGS) ×1 IMPLANT
BNDG GAUZE ELAST 4 BULKY (GAUZE/BANDAGES/DRESSINGS) ×2 IMPLANT
COVER SURGICAL LIGHT HANDLE (MISCELLANEOUS) ×3 IMPLANT
COVER WAND RF STERILE (DRAPES) ×2 IMPLANT
DRAPE DERMATAC (DRAPES) ×2 IMPLANT
DRAPE U-SHAPE 47X51 STRL (DRAPES) ×3 IMPLANT
DRESSING PREVENA PLUS CUSTOM (GAUZE/BANDAGES/DRESSINGS) IMPLANT
DRSG PREVENA PLUS CUSTOM (GAUZE/BANDAGES/DRESSINGS) ×2
DURAPREP 26ML APPLICATOR (WOUND CARE) ×2 IMPLANT
ELECT REM PT RETURN 9FT ADLT (ELECTROSURGICAL) ×2
ELECTRODE REM PT RTRN 9FT ADLT (ELECTROSURGICAL) ×1 IMPLANT
GLOVE BIOGEL PI IND STRL 9 (GLOVE) ×1 IMPLANT
GLOVE BIOGEL PI INDICATOR 9 (GLOVE) ×1
GLOVE SURG ORTHO 9.0 STRL STRW (GLOVE) ×2 IMPLANT
GOWN STRL REUS W/ TWL XL LVL3 (GOWN DISPOSABLE) ×2 IMPLANT
GOWN STRL REUS W/TWL XL LVL3 (GOWN DISPOSABLE) ×4
KIT BASIN OR (CUSTOM PROCEDURE TRAY) ×2 IMPLANT
KIT TURNOVER KIT B (KITS) ×2 IMPLANT
NS IRRIG 1000ML POUR BTL (IV SOLUTION) ×2 IMPLANT
PACK ORTHO EXTREMITY (CUSTOM PROCEDURE TRAY) ×2 IMPLANT
PAD ARMBOARD 7.5X6 YLW CONV (MISCELLANEOUS) ×4 IMPLANT
STOCKINETTE IMPERVIOUS LG (DRAPES) ×1 IMPLANT
SUT ETHILON 2 0 PSLX (SUTURE) ×3 IMPLANT
TOWEL GREEN STERILE (TOWEL DISPOSABLE) ×2 IMPLANT
TUBE CONNECTING 12X1/4 (SUCTIONS) ×2 IMPLANT
YANKAUER SUCT BULB TIP NO VENT (SUCTIONS) ×2 IMPLANT

## 2020-02-15 NOTE — Telephone Encounter (Signed)
Pt is a right transmet amputation today. Please see below.

## 2020-02-15 NOTE — Anesthesia Postprocedure Evaluation (Signed)
Anesthesia Post Note  Patient: Thomas Mcguire  Procedure(s) Performed: TRANSMETATARSAL AMPUTATION (Right Foot)     Patient location during evaluation: PACU Anesthesia Type: General Level of consciousness: awake and alert Pain management: pain level controlled Vital Signs Assessment: post-procedure vital signs reviewed and stable Respiratory status: spontaneous breathing, nonlabored ventilation and respiratory function stable Cardiovascular status: blood pressure returned to baseline and stable Postop Assessment: no apparent nausea or vomiting Anesthetic complications: no   No complications documented.  Last Vitals:  Vitals:   02/15/20 0921 02/15/20 1212  BP: (!) 106/52 118/68  Pulse: 82 82  Resp: 15 14  Temp: 36.4 C 36.6 C  SpO2: 96% 97%    Last Pain:  Vitals:   02/15/20 1212  TempSrc: Oral  PainSc:                  Lidia Collum

## 2020-02-15 NOTE — Care Management Important Message (Signed)
Important Message  Patient Details  Name: ATHANASIUS KESLING MRN: [id number] Date of Birth: 1940-07-25   Medicare Important Message Given:  Yes     Shelda Altes 02/15/2020, 11:09 AM

## 2020-02-15 NOTE — Telephone Encounter (Signed)
Meds  were ordered °

## 2020-02-15 NOTE — Progress Notes (Signed)
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  He was found to have gangrene of the second, third and fourth right toes.  On presentation he was hemodynamically stable.  X-rays of the foot showed no evidence of osteomyelitis but large linear metallic foreign body in the plantar soft tissue of the foot and on the mid first metatarsal.  Started on broad spectrum antibiotics.  Patient was transferred from [facility] to [facility] for  continuation of dialysis.  He underwent  lower extremity arteriogram with  intervention on 02/12/20.  Nephrology following for dialysis.  Dr. Sharol Given planning for right transmetatarsal amputation tomorrow.  02/15/2020: Patient seen.  Patient underwent transmetatarsal amputation of the right foot.  Pain is controlled.  Orthopedic surgery input is appreciated.  Assessment & Plan:   Principal Problem:   Cellulitis and abscess of toe of right foot Active Problems:   Chronic lymphocytic leukemia (CLL), B-cell (HCC)   PVD (peripheral vascular disease) (HCC)   CKD (chronic kidney disease)   DM2 (diabetes mellitus, type 2) (HCC)   Cellulitis of right foot   Necrotic toes (HCC)   Gangrene of the right foot: Presented with discoloration, swelling of the right foot.  X-ray did not show  any evidence of osteomyelitis.  Vascular surgery following.  Underwent  lower extremity angiogram stenting of  right femoral-popliteal artery ,  right tibioperoneal trunk and Balloon angioplasty on  02/12/20.  Continue current  antibiotics.  Follow-up cultures.  Currently is afebrile, hemodynamically stable.  Has mild leukocytosis. Dr. Sharol Given planning for right transmetatarsal amputation tomorrow. 02/15/2020: Patient is status post transmetatarsal amputation of the right foot.  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  We will continue his home medications on discharge.  ESRD on dialysis: Dialyzed on TTS schedule.  Nephrology following for dialysis  Diabetes type 2: Recent hemoglobinA1C of 6.1.  Monitor blood sugars.  Continue current regimen  Normocytic anemia: Secondary to ESRD.  Continue to monitor.  Currently hemoglobin stable.  History of CLL: Follows with Dr. Learta Codding.  Outpatient follow-up recommended  Thrombocytopenia: Continue to monitor.  Stable  Chronic hypotension: On midodrine  Hyperlipidemia: On Pravachol  DVT prophylaxis:Heparin [place] Code Status: Full Family Communication: Called wife for update today Status is: Inpatient  Remains inpatient appropriate because:Inpatient level of care appropriate due to severity of illness   Dispo: The patient is from: Home              Anticipated d/c is to: Home              Anticipated d/c date is: 2-3 days              Patient currently is not medically stable to d/c.   Difficult to place patient No     Consultants: Vascular surgery, nephrology  Procedures: as above, dialysis  Antimicrobials:  Anti-infectives (From admission, onward)   Start     Dose/Rate Route Frequency Ordered Stop  02/15/20 0630  ceFAZolin (ANCEF) IVPB 2g/100 mL premix        2 g 200 mL/hr over 30 Minutes Intravenous 30 min pre-op 02/14/20 1942 02/15/20 0747   02/15/20 0600  ceFAZolin (ANCEF) IVPB 2g/100 mL premix  Status:  Discontinued        2 g 200 mL/hr over 30 Minutes Intravenous On call to O.R. 02/14/20 1944 02/14/20 1948    02/14/20 1200  vancomycin (VANCOREADY) IVPB 500 mg/100 mL        500 mg 100 mL/hr over 60 Minutes Intravenous Every T-Th-Sa (Hemodialysis) 02/13/20 1347 02/16/20 2359   02/13/20 1445  vancomycin (VANCOREADY) IVPB 500 mg/100 mL        500 mg 100 mL/hr over 60 Minutes Intravenous  Once 02/13/20 1346 02/13/20 1718   02/12/20 1200  vancomycin (VANCOCIN) IVPB 1000 mg/200 mL premix  Status:  Discontinued        1,000 mg 200 mL/hr over 60 Minutes Intravenous Every T-Th-Sa (Hemodialysis) 02/10/20 1904 02/13/20 1347   02/11/20 0000  ceFEPIme (MAXIPIME) 1 g in sodium chloride 0.9 % 100 mL IVPB        1 g 200 mL/hr over 30 Minutes Intravenous Daily-1800 02/10/20 1904 02/16/20 2359   02/10/20 1630  vancomycin (VANCOREADY) IVPB 2000 mg/400 mL        2,000 mg 200 mL/hr over 120 Minutes Intravenous STAT 02/10/20 1619 02/10/20 1929   02/10/20 1630  piperacillin-tazobactam (ZOSYN) IVPB 3.375 g        3.375 g 100 mL/hr over 30 Minutes Intravenous STAT 02/10/20 1619 02/10/20 1707      Subjective:  Patient seen and examined the bedside this morning.  Hemodynamically stable.  Denies any complaints  Objective: Vitals:   02/15/20 0849 02/15/20 0853 02/15/20 0921 02/15/20 1212  BP: (!) 106/48  (!) 106/52 118/68  Pulse: 78  82 82  Resp: (!) 23  15 14   Temp:   97.6 F (36.4 C) 97.8 F (36.6 C)  TempSrc:   Oral Oral  SpO2: 91% 100% 96% 97%  Weight:      Height:        Intake/Output Summary (Last 24 hours) at 02/15/2020 1716 Last data filed at 02/15/2020 1500 Gross per 24 hour  Intake 1165.9 ml  Output 2501 ml  Net -1335.1 ml   Filed Weights   02/14/20 0453 02/14/20 1610 02/14/20 1847  Weight: 91.6 kg 89.2 kg 87 kg    Examination:  General exam: Comfortable, very pleasant gentleman Respiratory system: Bilateral equal air entry, normal vesicular breath sounds, no wheezes or crackles  Cardiovascular system: S1 & S2 heard, RRR. No JVD, murmurs, rubs, gallops or clicks. Gastrointestinal system:  Abdomen is nondistended, soft and nontender. No organomegaly or masses felt. Normal bowel sounds heard. Central nervous system: Alert and oriented. No focal neurological deficits. Extremities: No edema, no clubbing       Data Reviewed: I have personally reviewed following labs and imaging studies  CBC: Recent Labs  Lab 02/10/20 1612 02/11/20 0235 02/11/20 1203 02/12/20 2243 02/13/20 0249 02/14/20 1633 02/15/20 0256  WBC 13.8*   < > 13.1* 14.2* 13.7* 14.3* 15.7*  NEUTROABS 4.5  --   --   --  4.0  --  3.5  HGB 12.0*   < > 11.9* 11.4* 10.9* 11.0* 11.3*  HCT 40.9   < > 40.3 36.0* 36.4* 36.9* 37.7*  MCV 90.1   < > 89.4 86.3 88.6 88.7 88.5  PLT 138*   < > 139* 129*  124* 128* 117*   < > = values in this interval not displayed.   Basic Metabolic Panel: Recent Labs  Lab 02/11/20 0235 02/11/20 1203 02/12/20 2243 02/13/20 0249 02/14/20 1633 02/15/20 0256  NA 136  --  133* 136 133* 135  K 4.5  --  4.0 3.8 3.8 4.2  CL 95*  --  97* 96* 94* 96*  CO2 28  --  21* 23 24 24   GLUCOSE 141*  --  87 137* 143* 118*  BUN 45*  --  59* 61* 47* 29*  CREATININE 5.25* 5.49* 6.69* 6.81* 6.46* 4.98*  CALCIUM 8.9  --  8.7* 9.0 8.9 9.1  PHOS  --   --  5.9*  --  5.2*  --    GFR: Estimated Creatinine Clearance: 15.3 mL/min (A) (by C-G formula based on SCr of 4.98 mg/dL (H)). Liver Function Tests: Recent Labs  Lab 02/11/20 0235 02/12/20 2243 02/14/20 1633  AST 32  --   --   ALT 33  --   --   ALKPHOS 56  --   --   BILITOT 1.4*  --   --   PROT 6.0*  --   --   ALBUMIN 3.3* 2.8* 2.8*   No results for input(s): LIPASE, AMYLASE in the last 168 hours. No results for input(s): AMMONIA in the last 168 hours. Coagulation Profile: No results for input(s): INR, PROTIME in the last 168 hours. Cardiac Enzymes: No results for input(s): CKTOTAL, CKMB, CKMBINDEX, TROPONINI in the last 168 hours. BNP (last 3 results) No results for input(s): PROBNP in the last 8760 hours. HbA1C: No results for  input(s): HGBA1C in the last 72 hours. CBG: Recent Labs  Lab 02/14/20 2142 02/15/20 0650 02/15/20 0823 02/15/20 1140 02/15/20 1646  GLUCAP 165* 138* 141* 119* 176*   Lipid Profile: No results for input(s): CHOL, HDL, LDLCALC, TRIG, CHOLHDL, LDLDIRECT in the last 72 hours. Thyroid Function Tests: No results for input(s): TSH, T4TOTAL, FREET4, T3FREE, THYROIDAB in the last 72 hours. Anemia Panel: No results for input(s): VITAMINB12, FOLATE, FERRITIN, TIBC, IRON, RETICCTPCT in the last 72 hours. Sepsis Labs: No results for input(s): PROCALCITON, LATICACIDVEN in the last 168 hours.  Recent Results (from the past 240 hour(s))  SARS Coronavirus 2 by RT PCR (hospital order, performed in [place] hospital lab)     Status: None   Collection Time: 02/10/20  7:00 PM  Result Value Ref Range Status   SARS Coronavirus 2 NEGATIVE NEGATIVE Final    Comment: (NOTE) SARS-CoV-2 target nucleic acids are NOT DETECTED.  The SARS-CoV-2 RNA is generally detectable in upper and lower respiratory specimens during the acute phase of infection. The lowest concentration of SARS-CoV-2 viral copies this assay can detect is 250 copies / mL. A negative result does not preclude SARS-CoV-2 infection and should not be used as the sole basis for treatment or other patient management decisions.  A negative result may occur with improper specimen collection / handling, submission of specimen other than nasopharyngeal swab, presence of viral mutation(s) within the areas targeted by this assay, and inadequate number of viral copies (<250 copies / mL). A negative result must be combined with clinical observations, patient history, and epidemiological information.  Fact Sheet for Patients:   StrictlyIdeas.no  Fact Sheet for Healthcare Providers: BankingDealers.co.za  This test is not yet approved or  cleared by the [place] FDA and has been authorized for  detection and/or diagnosis of SARS-CoV-2 by FDA under an Emergency Use Authorization (  EUA).  This EUA will remain in effect (meaning this test can be used) for the duration of the COVID-19 declaration under Section 564(b)(1) of the Act, 21 U.S.C. section 360bbb-3(b)(1), unless the authorization is terminated or revoked sooner.  Performed at [facility], [place] [address], [place], [place] [address]   MRSA PCR Screening     Status: None   Collection Time: 02/11/20 11:07 PM   Specimen: Nasopharyngeal  Result Value Ref Range Status   MRSA by PCR NEGATIVE NEGATIVE Final    Comment:        The GeneXpert MRSA Assay (FDA approved for NASAL specimens only), is one component of a comprehensive MRSA colonization surveillance program. It is not intended to diagnose MRSA infection nor to guide or monitor treatment for MRSA infections. Performed at [facility], [place] [address], [place], [place] [address]   Culture, blood (routine x 2)     Status: None (Preliminary result)   Collection Time: 02/12/20 11:08 PM   Specimen: BLOOD  Result Value Ref Range Status   Specimen Description BLOOD LEFT UPPER HAND  Final   Special Requests   Final    BOTTLES DRAWN AEROBIC ONLY Blood Culture adequate volume   Culture   Final    NO GROWTH 2 DAYS Performed at [facility], [place] [address] [place], [place] [address]    Report Status PENDING  Incomplete  Culture, blood (routine x 2)     Status: None (Preliminary result)   Collection Time: 02/12/20 11:08 PM   Specimen: BLOOD  Result Value Ref Range Status   Specimen Description BLOOD LEFT LOWER HAND  Final   Special Requests   Final    BOTTLES DRAWN AEROBIC ONLY Blood Culture adequate volume   Culture   Final    NO GROWTH 2 DAYS Performed at [facility], [place] [address] [place], [place] [address]    Report Status PENDING  Incomplete  Surgical pcr screen     Status: None   Collection Time: 02/14/20  9:08 PM    Specimen: Nasal Mucosa; Nasal Swab  Result Value Ref Range Status   MRSA, PCR NEGATIVE NEGATIVE Final   Staphylococcus aureus NEGATIVE NEGATIVE Final    Comment: (NOTE) The Xpert SA Assay (FDA approved for NASAL specimens in patients 87 years of age and older), is one component of a comprehensive surveillance program. It is not intended to diagnose infection nor to guide or monitor treatment. Performed at [facility], [place] [address] [place],  [address]          Radiology Studies: DG Chest 2 View  Result Date: 02/14/2020 CLINICAL DATA:  Preop transmetatarsal amputation. EXAM: CHEST - 2 VIEW COMPARISON:  11/07/2019 FINDINGS: Right-sided dialysis catheter with tip in the lower SVC. Chronic cardiomegaly with unchanged mediastinal contours. Small bilateral pleural effusions, right greater than left. Vascular congestion appears similar. Suspicion of mild pulmonary edema with septal thickening peripherally. No pneumothorax or confluent airspace disease. IMPRESSION: 1. Right-sided dialysis catheter with tip in the lower SVC. 2. Small bilateral pleural effusions, right greater than left, with vascular congestion and mild pulmonary edema. Electronically Signed   By: Keith Rake M.D.   On: 02/14/2020 21:33        Scheduled Meds: . aspirin EC  81 mg Oral Daily  . calcitRIOL  0.25 mcg Oral Q M,W,F  . Chlorhexidine Gluconate Cloth  6 each Topical Q0600  . [START ON 02/16/2020] Chlorhexidine Gluconate Cloth  6 each Topical Q0600  . clopidogrel  75 mg Oral Daily  . cyanocobalamin  1,000 mcg Intramuscular Q30 days  . docusate sodium  100 mg Oral BID  . fentaNYL      . folic acid  1 mg Oral Daily  . [START ON 02/16/2020] heparin  5,000 Units Subcutaneous Q8H  . insulin aspart  0-15 Units Subcutaneous TID WC  . insulin aspart  0-5 Units Subcutaneous QHS  . midodrine  10 mg Oral Q T,Th,Sa-HD  . multivitamin  1 tablet Oral QHS  . pravastatin  40 mg Oral QHS   Continuous  Infusions: . sodium chloride 75 mL/hr at 02/15/20 1210  . ceFEPime (MAXIPIME) IV 1 g (02/15/20 1701)  . vancomycin 500 mg (02/14/20 1758)     LOS: 5 days    Time spent: 25 mins.   Bonnell Public, MD [facility] P2/04/2020, 5:16 PM

## 2020-02-15 NOTE — Evaluation (Signed)
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  He had arteriorgram with intervention on 02/12/20 and underwent transmet amputation 02/15/20.  Clinical Impression  Patient presents with decreased mobility due to new amputation with NWB, decreased balance, decreased activity tolerance, decreased strength and pain.  He currently needs min A and does place weight on R foot during transfers, but can hop a little, but limited by pain.  He will need to negotiate steps for home entry as reports can't have a ramp made.  Feel he will need continued skilled PT in the acute setting and follow up HHPT at d/c.     Follow Up Recommendations Home health PT;Supervision/Assistance - 24 hour    Equipment Recommendations  None recommended by PT    Recommendations for Other Services       Precautions / Restrictions Precautions Precautions: Fall Precaution Comments: wound vac Required Braces or Orthoses: Other Brace Other Brace: post op shoe Restrictions Weight Bearing Restrictions: Yes RLE Weight Bearing: Non weight bearing Other Position/Activity Restrictions: per PA if puts a little weight on it for transfers in post op shoe, it's okay      Mobility  Bed Mobility Overal bed mobility: Needs Assistance Bed Mobility: Supine to Sit;Sit to Supine     Supine to sit: Supervision Sit to supine: Min guard   General bed mobility comments: assist for lines to sit, assist for guiding foot onto bed to supine    Transfers Overall transfer level: Needs  assistance Equipment used: Rolling walker (2 wheeled) Transfers: Sit to/from Stand Sit to Stand: Min assist;From elevated surface         General transfer comment: up from EOB elevated per pt request, then up from armchair with min A to hop back to bed  Ambulation/Gait Ambulation/Gait assistance: Min assist Gait Distance (Feet): 6 Feet Assistive device: Rolling walker (2 wheeled) Gait Pattern/deviations: Step-to pattern;Decreased stride length;Trunk flexed     General Gait Details: able to keep NWB on R for getting to chair, then placed foot down for transfers, noted bloody discharge in vac tubing, but lightened up with elevation of the foot, RN aware  Stairs            Wheelchair Mobility    Modified Rankin (Stroke Patients Only)       Balance Overall balance assessment: Needs assistance Sitting-balance support: No upper extremity supported Sitting balance-Leahy Scale: Good     Standing balance support: Bilateral upper extremity supported Standing balance-Leahy Scale: Poor Standing balance comment: UE support needed due to NWB                             Pertinent Vitals/Pain Pain Assessment: 0-10 Pain Score: 4  Pain Location: R foot Pain Descriptors / Indicators: Operative site guarding Pain Intervention(s): Monitored during session;Repositioned    Home Living Family/patient expects to be discharged to:: Private residence Living Arrangements: Spouse/significant other Available Help at Discharge: Family;Available 24 hours/day Type of Home: House Home Access: Stairs to enter Entrance Stairs-Rails: Can reach both Entrance Stairs-Number of Steps: 3-4 Home Layout: One  level Home Equipment: Walker - 2 wheels;Shower seat;Wheelchair - manual;Grab bars - tub/shower      Prior Function           Comments: has been using cane intermittently, independent ADLs, limited driving and IADLs     Hand Dominance   Dominant Hand: Right     Extremity/Trunk Assessment   Upper Extremity Assessment Upper Extremity Assessment: Generalized weakness    Lower Extremity Assessment Lower Extremity Assessment: RLE deficits/detail RLE Deficits / Details: foot wrapped with wound vac attatched, able to perform ankle pump and lift leg antigravity       Communication   Communication: No difficulties  Cognition Arousal/Alertness: Awake/alert Behavior During Therapy: WFL for tasks assessed/performed Overall Cognitive Status: Within Functional Limits for tasks assessed                                        General Comments      Exercises     Assessment/Plan    PT Assessment Patient needs continued PT services  PT Problem List Decreased balance;Decreased knowledge of use of DME;Pain;Decreased knowledge of precautions;Decreased activity tolerance;Decreased mobility;Decreased strength;Decreased safety awareness       PT Treatment Interventions DME instruction;Stair training;Functional mobility training;Balance training;Gait training;Patient/family education;Therapeutic activities    PT Goals (Current goals can be found in the Care Plan section)  Acute Rehab PT Goals Patient Stated Goal: to get home PT Goal Formulation: With patient Time For Goal Achievement: 02/29/20 Potential to Achieve Goals: Good    Frequency Min 3X/week   Barriers to discharge        Co-evaluation               AM-PAC PT "6 Clicks" Mobility  Outcome Measure Help needed turning from your back to your side while in a flat bed without using bedrails?: None Help needed moving from lying on your back to sitting on the side of a flat bed without using bedrails?: None Help needed moving to and from a bed to a chair (including a wheelchair)?: A Little Help needed standing up from a chair using your arms (e.g., wheelchair or bedside chair)?: A Little Help needed to walk in hospital room?: A Little Help needed climbing 3-5 steps  with a railing? : A Lot 6 Click Score: 19    End of Session Equipment Utilized During Treatment: Gait belt Activity Tolerance: Patient limited by pain Patient left: in bed;with call bell/phone within reach Nurse Communication: Mobility status PT Visit Diagnosis: Other abnormalities of gait and mobility (R26.89);Muscle weakness (generalized) (M62.81);Difficulty in walking, not elsewhere classified (R26.2);Pain Pain - Right/Left: Right Pain - part of body: Ankle and joints of foot    Time: 1829-9371 PT Time Calculation (min) (ACUTE ONLY): 29 min   Charges:   PT Evaluation $PT Eval Moderate Complexity: 1 Mod PT Treatments $Therapeutic Activity: 8-22 mins        Magda Kiel, PT Acute Rehabilitation Services IRCVE:[phone] [phone] 02/15/2020   Reginia Naas 02/15/2020, 5:02 PM

## 2020-02-15 NOTE — Transfer of Care (Signed)
Immediate Anesthesia Transfer of Care Note  Patient: Thomas Mcguire  Procedure(s) Performed: TRANSMETATARSAL AMPUTATION (Right Foot)  Patient Location: PACU  Anesthesia Type:General  Level of Consciousness: awake, alert , oriented, sedated and patient cooperative  Airway & Oxygen Therapy: Patient Spontanous Breathing and Patient connected to face mask oxygen  Post-op Assessment: Report given to RN, Post -op Vital signs reviewed and stable and Patient moving all extremities  Post vital signs: Reviewed and stable  Last Vitals:  Vitals Value Taken Time  BP 93/60 02/15/20 0822  Temp    Pulse 77 02/15/20 0822  Resp 17 02/15/20 0826  SpO2 97 % 02/15/20 0822  Vitals shown include unvalidated device data.  Last Pain:  Vitals:   02/14/20 2257  TempSrc:   PainSc: Asleep      Patients Stated Pain Goal: 0 (66/06/30 1601)  Complications: No complications documented.

## 2020-02-15 NOTE — Op Note (Signed)
02/15/2020  8:26 AM  PATIENT:  Thomas Mcguire    PRE-OPERATIVE DIAGNOSIS:  gangrene right toes  POST-OPERATIVE DIAGNOSIS:  Same  PROCEDURE:  TRANSMETATARSAL AMPUTATION right foot Application of 13 cm Prevena incisional wound VAC    SURGEON:  Newt Minion, MD  PHYSICIAN ASSISTANT:None ANESTHESIA:   General  PREOPERATIVE INDICATIONS:  Thomas Mcguire is a  80 y.o. male with a diagnosis of gangrene right toes who failed conservative measures and elected for surgical management.    The risks benefits and alternatives were discussed with the patient preoperatively including but not limited to the risks of infection, bleeding, nerve injury, cardiopulmonary complications, the need for revision surgery, among others, and the patient was willing to proceed.  OPERATIVE IMPLANTS: 13 cm Prevena incisional wound VAC  @ENCIMAGES @  OPERATIVE FINDINGS: Minimal petechial bleeding at the amputation site.  No abscess.  OPERATIVE PROCEDURE: Patient was brought the operating room underwent general anesthetic.  After adequate levels anesthesia were obtained patient's right lower extremity was prepped using DuraPrep draped into a sterile field a timeout was called.  A fishmouth incision was made proximal to the gangrenous toes and oscillating saw was used to perform a transmetatarsal amputation.  The microcirculation of the level amputation was minimal and this was resected back an additional centimeter which had improved microcirculation.  The wound was irrigated with normal saline electrocautery was used hemostasis the incision was closed using 2-0 nylon a Prevena wound VAC was applied this was outlined with dermatac this had a good suction fit patient was extubated taken to PACU in stable condition   DISCHARGE PLANNING:  Antibiotic duration: 24 hours antibiotics postoperatively  Weightbearing: Nonweightbearing on the right  Pain medication: Opioid pathway  Dressing care/ Wound VAC: Continue  wound VAC for 1 week  Ambulatory devices: Crutches or walker  Discharge to: Anticipate discharge to home  Follow-up: In the office 1 week post operative.

## 2020-02-15 NOTE — Progress Notes (Signed)
80 yo M now s/p revascularization of occluded right fem BK pop bypass for CLI with tissue loss.  Right TMA today by Dr. Sharol Given.  Would recommend aspirin and plavix at discharge.  Will arrange follow-up in one month with right leg arterial duplex and ABI's to evaluate stented bypass.  Dr. Sharol Given will follow TMA.  [name], MD [facility] [place] Office: [place]

## 2020-02-15 NOTE — Interval H&P Note (Signed)
History and Physical Interval Note:  02/15/2020 6:33 AM  Thomas Mcguire  has presented today for surgery, with the diagnosis of gangrene right toes.  The various methods of treatment have been discussed with the patient and family. 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  The patient's history has been reviewed, patient examined, no change in status, stable for surgery.  I have reviewed the patient's chart and labs.  Questions were answered to the patient's satisfaction.     Newt Minion

## 2020-02-15 NOTE — Anesthesia Procedure Notes (Signed)
Procedure Name: LMA Insertion Date/Time: 02/15/2020 7:40 AM Performed by: Myna Bright, CRNA Pre-anesthesia Checklist: Patient identified, Emergency Drugs available, Suction available and Patient being monitored Patient Re-evaluated:Patient Re-evaluated prior to induction Oxygen Delivery Method: Circle system utilized Preoxygenation: Pre-oxygenation with 100% oxygen Induction Type: IV induction LMA: LMA with gastric port inserted LMA Size: 5.0 Number of attempts: 1 Placement Confirmation: positive ETCO2 and breath sounds checked- equal and bilateral Tube secured with: Tape Dental Injury: Teeth and Oropharynx as per pre-operative assessment

## 2020-02-15 NOTE — Progress Notes (Addendum)
[facility] Progress Note   Subjective:   Patient seen and examined in room post surgery.  Wife at bedside. Reports some improvement in R foot pain with recent medication.  States it is tolerable but not comfortable. Denies CP, SOB, abdominal pain, n/v/d, weakness and fatigue.   Objective Vitals:   02/15/20 0849 02/15/20 0853 02/15/20 0921 02/15/20 1212  BP: (!) 106/48  (!) 106/52 118/68  Pulse: 78  82 82  Resp: (!) 23  15 14   Temp:   97.6 F (36.4 C) 97.8 F (36.6 C)  TempSrc:   Oral Oral  SpO2: 91% 100% 96% 97%  Weight:      Height:       Physical Exam General:WDWN male in NAD Heart:RRR, no mrg Lungs:CTAB, decreased in bases, nml WOB on RA  Abdomen:soft, NTND Extremities:no LE edema, R foot dressed with wound vac in place Dialysis Access: [facility], RU AVF +b/t   Filed Weights   02/14/20 0453 02/14/20 1610 02/14/20 1847  Weight: 91.6 kg 89.2 kg 87 kg    Intake/Output Summary (Last 24 hours) at 02/15/2020 1219 Last data filed at 02/15/2020 0825 Gross per 24 hour  Intake 957 ml  Output 2501 ml  Net -1544 ml    Additional Objective Labs: Basic Metabolic Panel: Recent Labs  Lab 02/12/20 2243 02/13/20 0249 02/14/20 1633 02/15/20 0256  NA 133* 136 133* 135  K 4.0 3.8 3.8 4.2  CL 97* 96* 94* 96*  CO2 21* 23 24 24   GLUCOSE 87 137* 143* 118*  BUN 59* 61* 47* 29*  CREATININE 6.69* 6.81* 6.46* 4.98*  CALCIUM 8.7* 9.0 8.9 9.1  PHOS 5.9*  --  5.2*  --    Liver Function Tests: Recent Labs  Lab 02/11/20 0235 02/12/20 2243 02/14/20 1633  AST 32  --   --   ALT 33  --   --   ALKPHOS 56  --   --   BILITOT 1.4*  --   --   PROT 6.0*  --   --   ALBUMIN 3.3* 2.8* 2.8*   CBC: Recent Labs  Lab 02/10/20 1612 02/11/20 0235 02/11/20 1203 02/12/20 2243 02/13/20 0249 02/14/20 1633 02/15/20 0256  WBC 13.8*   < > 13.1* 14.2* 13.7* 14.3* 15.7*  NEUTROABS 4.5  --   --   --  4.0  --  3.5  HGB 12.0*   < > 11.9* 11.4* 10.9* 11.0* 11.3*  HCT 40.9   < > 40.3  36.0* 36.4* 36.9* 37.7*  MCV 90.1   < > 89.4 86.3 88.6 88.7 88.5  PLT 138*   < > 139* 129* 124* 128* 117*   < > = values in this interval not displayed.   Blood Culture    Component Value Date/Time   SDES BLOOD LEFT UPPER HAND 02/12/2020 2308   SDES BLOOD LEFT LOWER HAND 02/12/2020 2308   SPECREQUEST  02/12/2020 2308    BOTTLES DRAWN AEROBIC ONLY Blood Culture adequate volume   SPECREQUEST  02/12/2020 2308    BOTTLES DRAWN AEROBIC ONLY Blood Culture adequate volume   CULT  02/12/2020 2308    NO GROWTH 2 DAYS Performed at [facility], [place] [address], [place], [place] [address]    CULT  02/12/2020 2308    NO GROWTH 2 DAYS Performed at [facility] [address] [place], [place] [address]    REPTSTATUS PENDING 02/12/2020 2308   REPTSTATUS PENDING 02/12/2020 2308    CBG: Recent Labs  Lab 02/14/20 1136 02/14/20 2142 02/15/20 0650 02/15/20 0823 02/15/20 1140  GLUCAP 150* 165* 138* 141* 119*   Studies/Results: DG Chest 2 View  Result Date: 02/14/2020 CLINICAL DATA:  Preop transmetatarsal amputation. EXAM: CHEST - 2 VIEW COMPARISON:  11/07/2019 FINDINGS: Right-sided dialysis catheter with tip in the lower SVC. Chronic cardiomegaly with unchanged mediastinal contours. Small bilateral pleural effusions, right greater than left. Vascular congestion appears similar. Suspicion of mild pulmonary edema with septal thickening peripherally. No pneumothorax or confluent airspace disease. IMPRESSION: 1. Right-sided dialysis catheter with tip in the lower SVC. 2. Small bilateral pleural effusions, right greater than left, with vascular congestion and mild pulmonary edema. Electronically Signed   By: Keith Rake M.D.   On: 02/14/2020 21:33    Medications: . sodium chloride 75 mL/hr at 02/15/20 1210  . ceFEPime (MAXIPIME) IV Stopped (02/13/20 1759)  . vancomycin 500 mg (02/14/20 1758)   . calcitRIOL  0.25 mcg Oral Q M,W,F  . Chlorhexidine Gluconate Cloth  6 each Topical  Q0600  . cyanocobalamin  1,000 mcg Intramuscular Q30 days  . docusate sodium  100 mg Oral BID  . fentaNYL      . folic acid  1 mg Oral Daily  . [START ON 02/16/2020] heparin  5,000 Units Subcutaneous Q8H  . insulin aspart  0-15 Units Subcutaneous TID WC  . insulin aspart  0-5 Units Subcutaneous QHS  . midodrine  10 mg Oral Q T,Th,Sa-HD  . multivitamin  1 tablet Oral QHS  . pravastatin  40 mg Oral QHS    Dialysis Orders: Center:[facility]. 180NRe, 4 hours, BFR 400, DFR 800, EDW 85kg, 2K/2.5Ca, TDC, no heparin No ESA Calcitriol 0.75 mcg PO q HD  Assessment/Plan: 1. Gangrene of R foot/PAD: VVS following,s/parteriogram on 02/13/20 and TMA this AM by Dr. Sharol Given. Antibiotics per primary team. 2. ESRD:TTS schedule, back on schedule yesterday.  Plan for HD again tomorrow.Dialysis treatments are currently shortened due to high census/nursing shortage. 3. Hypotension/volume:BP controlled. Continue midodrine prior to HD.  No edema on exam. UF as tolerated.  4. Anemia:Hgb at goal, 11.3. No ESA indicated at this time. 5. Metabolic bone disease:Calcium controlled.Phos slightly elevated.Continue calcitriol. No binder on outpt med list. If phosphorus remains high will need to start a non-calcium based binder. 6. T2DM:Mgt per primary team 7. Thrombocytopenia: Stable, no heparin with HD    Jen Mow, PA-C [place] 02/15/2020,12:19 PM  LOS: 5 days   I have seen and examined this patient and agree with plan and assessment in the above note with renal recommendations/intervention highlighted.  Doing well after surgery.  For HD tomorrow to get back on his outpatient schedule.  Broadus John A Jigar Zielke,MD 02/15/2020 1:22 PM

## 2020-02-15 NOTE — Progress Notes (Signed)
Orthopedic Tech Progress Note Patient Details:  Thomas Mcguire July 21, 1940 [id number] Dropped off 2 different post op shoe. A DARCO and a POST OP FLAT SHOE Patient ID: Thomas Mcguire, male   DOB: November 12, 1940, 79 y.o.   MRN: [id number]   Janit Pagan 02/15/2020, 3:49 PM

## 2020-02-15 NOTE — Telephone Encounter (Signed)
Thomas Mcguire called on behalf of patient called requesting pain medication for amputee patient coming out of surgery. Please send to send medication at [facility]

## 2020-02-16 ENCOUNTER — Encounter (HOSPITAL_COMMUNITY): Payer: Self-pay | Admitting: Orthopedic Surgery

## 2020-02-16 DIAGNOSIS — L03031 Cellulitis of right toe: Secondary | ICD-10-CM | POA: Diagnosis not present

## 2020-02-16 DIAGNOSIS — L02611 Cutaneous abscess of right foot: Secondary | ICD-10-CM | POA: Diagnosis not present

## 2020-02-16 LAB — CBC
HCT: 34.8 % — ABNORMAL LOW (ref 39.0–52.0)
Hemoglobin: 10.7 g/dL — ABNORMAL LOW (ref 13.0–17.0)
MCH: 27.4 pg (ref 26.0–34.0)
MCHC: 30.7 g/dL (ref 30.0–36.0)
MCV: 89 fL (ref 80.0–100.0)
Platelets: 123 10*3/uL — ABNORMAL LOW (ref 150–400)
RBC: 3.91 MIL/uL — ABNORMAL LOW (ref 4.22–5.81)
RDW: 19 % — ABNORMAL HIGH (ref 11.5–15.5)
WBC: 14 10*3/uL — ABNORMAL HIGH (ref 4.0–10.5)
nRBC: 0 % (ref 0.0–0.2)

## 2020-02-16 LAB — RENAL FUNCTION PANEL
Albumin: 2.8 g/dL — ABNORMAL LOW (ref 3.5–5.0)
Anion gap: 14 (ref 5–15)
BUN: 45 mg/dL — ABNORMAL HIGH (ref 8–23)
CO2: 23 mmol/L (ref 22–32)
Calcium: 8.8 mg/dL — ABNORMAL LOW (ref 8.9–10.3)
Chloride: 96 mmol/L — ABNORMAL LOW (ref 98–111)
Creatinine, Ser: 6.57 mg/dL — ABNORMAL HIGH (ref 0.61–1.24)
GFR, Estimated: 8 mL/min — ABNORMAL LOW (ref >60)
Glucose, Bld: 152 mg/dL — ABNORMAL HIGH (ref 70–99)
Phosphorus: 5.8 mg/dL — ABNORMAL HIGH (ref 2.5–4.6)
Potassium: 4.2 mmol/L (ref 3.5–5.1)
Sodium: 133 mmol/L — ABNORMAL LOW (ref 135–145)

## 2020-02-16 LAB — GLUCOSE, CAPILLARY
Glucose-Capillary: 137 mg/dL — ABNORMAL HIGH (ref 70–99)
Glucose-Capillary: 144 mg/dL — ABNORMAL HIGH (ref 70–99)
Glucose-Capillary: 177 mg/dL — ABNORMAL HIGH (ref 70–99)
Glucose-Capillary: 138 mg/dL — ABNORMAL HIGH (ref 70–99)

## 2020-02-16 LAB — HEPATITIS B CORE ANTIBODY, TOTAL: Hep B Core Total Ab: NONREACTIVE

## 2020-02-16 LAB — HEPATITIS B SURFACE ANTIGEN: Hepatitis B Surface Ag: NONREACTIVE

## 2020-02-16 MED ORDER — PENTAFLUOROPROP-TETRAFLUOROETH EX AERO: 1.0000 "application " | INHALATION_SPRAY | CUTANEOUS | Status: DC | PRN

## 2020-02-16 MED ORDER — LIDOCAINE-PRILOCAINE 2.5-2.5 % EX CREA: 1.0000 "application " | TOPICAL_CREAM | CUTANEOUS | Status: DC | PRN

## 2020-02-16 MED ORDER — SODIUM CHLORIDE 0.9 % IV SOLN: 100.0000 mL | INTRAVENOUS | Status: DC | PRN

## 2020-02-16 MED ORDER — HEPARIN SODIUM (PORCINE) 1000 UNIT/ML IJ SOLN
INTRAMUSCULAR | Status: AC
  Filled 2020-02-16: qty 2

## 2020-02-16 MED ORDER — LIDOCAINE HCL (PF) 1 % IJ SOLN: 5.0000 mL | INTRAMUSCULAR | Status: DC | PRN

## 2020-02-16 MED ORDER — HEPARIN SODIUM (PORCINE) 1000 UNIT/ML DIALYSIS: 1000.0000 [IU] | INTRAMUSCULAR | Status: DC | PRN

## 2020-02-16 MED ORDER — ALTEPLASE 2 MG IJ SOLR: 2.0000 mg | Freq: Once | INTRAMUSCULAR | Status: DC | PRN

## 2020-02-16 MED ORDER — VANCOMYCIN HCL IN DEXTROSE 500-5 MG/100ML-% IV SOLN
INTRAVENOUS | Status: AC
  Administered 2020-02-16: 500 mg
  Filled 2020-02-16: qty 100

## 2020-02-16 MED ORDER — HYDROMORPHONE HCL 1 MG/ML IJ SOLN
INTRAMUSCULAR | Status: AC
  Filled 2020-02-16: qty 0.5

## 2020-02-16 MED ORDER — MORPHINE SULFATE (PF) 2 MG/ML IV SOLN
INTRAVENOUS | Status: AC
  Filled 2020-02-16: qty 1

## 2020-02-16 MED ORDER — HEPARIN SODIUM (PORCINE) 1000 UNIT/ML IJ SOLN
INTRAMUSCULAR | Status: AC
  Filled 2020-02-16: qty 4

## 2020-02-16 NOTE — Progress Notes (Signed)
Patient ID: Thomas Mcguire, male   DOB: 18-Mar-1940, 80 y.o.   MRN: [id number] patient is postoperative day 1 right transmetatarsal amputation there is a small amount of drainage in the wound VAC canister discussed the importance of nonweightbearing on the right and anticipate discharge to home once he is safe with therapy.

## 2020-02-16 NOTE — Progress Notes (Signed)
OT Cancellation Note  Patient Details Name: Thomas Mcguire MRN: [id number] DOB: [birth year]   Cancelled Treatment:    Reason Eval/Treat Not Completed: Patient declined, just returned from dialysis and c/o fatigue.  Requesting OT eval tomorrow.  OT to continue as appropriate.    Richard D Popella 02/16/2020, 2:51 PM

## 2020-02-16 NOTE — Progress Notes (Addendum)
[facility] Progress Note   Subjective:   Patient seen and examined at bedside.  Feeling well except for pain in R foot.  Denies CP, SOB, abdominal pain, n/v/d, weakness and fatigue.   Objective Vitals:   02/16/20 0330 02/16/20 0412 02/16/20 0525 02/16/20 0753  BP: 119/61  117/64 124/60  Pulse:   88 90  Resp: 19 19 18 17   Temp:   98.2 F (36.8 C) 98 F (36.7 C)  TempSrc:   Oral Oral  SpO2: 93% 98% 100% 96%  Weight:   92.3 kg   Height:       Physical Exam General:WDWN alert male in NAD Heart:RRR, no mrg Lungs:CTAB, breath sounds decreased in bases, nml WOB on RA Abdomen:soft, NTND Extremities:no LE edema, R foot dressed with wound vac in place Dialysis Access: [facility], RU AVF +b/t   Filed Weights   02/14/20 1610 02/14/20 1847 02/16/20 0525  Weight: 89.2 kg 87 kg 92.3 kg    Intake/Output Summary (Last 24 hours) at 02/16/2020 0920 Last data filed at 02/15/2020 1500 Gross per 24 hour  Intake 208.9 ml  Output --  Net 208.9 ml    Additional Objective Labs: Basic Metabolic Panel: Recent Labs  Lab 02/12/20 2243 02/13/20 0249 02/14/20 1633 02/15/20 0256  NA 133* 136 133* 135  K 4.0 3.8 3.8 4.2  CL 97* 96* 94* 96*  CO2 21* 23 24 24   GLUCOSE 87 137* 143* 118*  BUN 59* 61* 47* 29*  CREATININE 6.69* 6.81* 6.46* 4.98*  CALCIUM 8.7* 9.0 8.9 9.1  PHOS 5.9*  --  5.2*  --    Liver Function Tests: Recent Labs  Lab 02/11/20 0235 02/12/20 2243 02/14/20 1633  AST 32  --   --   ALT 33  --   --   ALKPHOS 56  --   --   BILITOT 1.4*  --   --   PROT 6.0*  --   --   ALBUMIN 3.3* 2.8* 2.8*   CBC: Recent Labs  Lab 02/10/20 1612 02/11/20 0235 02/11/20 1203 02/12/20 2243 02/13/20 0249 02/14/20 1633 02/15/20 0256  WBC 13.8*   < > 13.1* 14.2* 13.7* 14.3* 15.7*  NEUTROABS 4.5  --   --   --  4.0  --  3.5  HGB 12.0*   < > 11.9* 11.4* 10.9* 11.0* 11.3*  HCT 40.9   < > 40.3 36.0* 36.4* 36.9* 37.7*  MCV 90.1   < > 89.4 86.3 88.6 88.7 88.5  PLT 138*   < > 139*  129* 124* 128* 117*   < > = values in this interval not displayed.   Blood Culture    Component Value Date/Time   SDES BLOOD LEFT UPPER HAND 02/12/2020 2308   SDES BLOOD LEFT LOWER HAND 02/12/2020 2308   SPECREQUEST  02/12/2020 2308    BOTTLES DRAWN AEROBIC ONLY Blood Culture adequate volume   SPECREQUEST  02/12/2020 2308    BOTTLES DRAWN AEROBIC ONLY Blood Culture adequate volume   CULT  02/12/2020 2308    NO GROWTH 2 DAYS Performed at [facility], [place] [address], [place], [place] [address]    CULT  02/12/2020 2308    NO GROWTH 2 DAYS Performed at [facility] [address] [place], [place] [address]    REPTSTATUS PENDING 02/12/2020 2308   REPTSTATUS PENDING 02/12/2020 2308   CBG: Recent Labs  Lab 02/15/20 0823 02/15/20 1140 02/15/20 1646 02/15/20 2123 02/16/20 0751  GLUCAP 141* 119*  176* 141* 137*   Studies/Results: DG Chest 2 View  Result Date: 02/14/2020 CLINICAL DATA:  Preop transmetatarsal amputation. EXAM: CHEST - 2 VIEW COMPARISON:  11/07/2019 FINDINGS: Right-sided dialysis catheter with tip in the lower SVC. Chronic cardiomegaly with unchanged mediastinal contours. Small bilateral pleural effusions, right greater than left. Vascular congestion appears similar. Suspicion of mild pulmonary edema with septal thickening peripherally. No pneumothorax or confluent airspace disease. IMPRESSION: 1. Right-sided dialysis catheter with tip in the lower SVC. 2. Small bilateral pleural effusions, right greater than left, with vascular congestion and mild pulmonary edema. Electronically Signed   By: Keith Rake M.D.   On: 02/14/2020 21:33    Medications: . sodium chloride 75 mL/hr at 02/15/20 1210  . ceFEPime (MAXIPIME) IV 1 g (02/15/20 1701)  . vancomycin 500 mg (02/14/20 1758)   . aspirin EC  81 mg Oral Daily  . calcitRIOL  0.25 mcg Oral Q M,W,F  . Chlorhexidine Gluconate Cloth  6 each Topical Q0600  . Chlorhexidine Gluconate Cloth  6 each Topical Q0600   . clopidogrel  75 mg Oral Daily  . cyanocobalamin  1,000 mcg Intramuscular Q30 days  . docusate sodium  100 mg Oral BID  . folic acid  1 mg Oral Daily  . heparin  5,000 Units Subcutaneous Q8H  . insulin aspart  0-15 Units Subcutaneous TID WC  . insulin aspart  0-5 Units Subcutaneous QHS  . midodrine  10 mg Oral Q T,Th,Sa-HD  . multivitamin  1 tablet Oral QHS  . pravastatin  40 mg Oral QHS    Dialysis Orders: Center:[facility]. 180NRe, 4 hours, BFR 400, DFR 800, EDW 85kg, 2K/2.5Ca, TDC, no heparin No ESA Calcitriol 0.75 mcg PO q HD  Assessment/Plan: 1. Gangrene of R foot/PAD: VVS following,s/parteriogram on 02/13/20 and TMA 02/15/20 by Dr. Sharol Given.Antibiotics per primary team.VVS recommend plavix and ASA on d/c with f.u of fem pop bypass with ABI in 1 month.  TMA per Dr. Sharol Given.  2. ESRD:TTS schedule.  HD today per regular schedule. Dialysis treatments are currently shortened due to high census/nursing shortage. 3. Hypotension/volume:BP controlled. Continue midodrine prior to HD.  No edema on exam.UF as tolerated.  4. Anemia:Hgb at goal, 11.3. No ESA indicated at this time. 5. Metabolic bone disease:Calcium controlled.Phos slightly elevated.Continue calcitriol. No binder on outpt med list. If phosphorus remains high will need to start a non-calcium based binder - last lab in goal. 6. T2DM:Mgt per primary team 7. Thrombocytopenia: Stable, no heparin with HD   Jen Mow, PA-C [place] 02/16/2020,9:20 AM  LOS: 6 days   I have seen and examined this patient and agree with plan and assessment in the above note with renal recommendations/intervention highlighted.  Broadus John A Luiz Trumpower,MD 02/16/2020 11:16 AM

## 2020-02-16 NOTE — Progress Notes (Signed)
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  He was found to have gangrene of the second, third and fourth right toes.  On presentation he was hemodynamically stable.  X-rays of the foot showed no evidence of osteomyelitis but large linear metallic foreign body in the plantar soft tissue of the foot and on the mid first metatarsal.  Started on broad spectrum antibiotics.  Patient was transferred from [facility] to [facility] for  continuation of dialysis.  He underwent  lower extremity arteriogram with  intervention on 02/12/20.  Nephrology following for dialysis.  Dr. Sharol Given planning for right transmetatarsal amputation tomorrow.  02/15/2020: Patient seen.  Patient underwent transmetatarsal amputation of the right foot.  Pain is controlled.  Orthopedic surgery input is appreciated.  02/16/2020: Patient seen.  Patient was seen on hemodialysis today.  No new complaints.  Nephrology input is appreciated.  Assessment & Plan:   Principal Problem:   Cellulitis and abscess of toe of right foot Active Problems:   Chronic lymphocytic leukemia (CLL), B-cell (HCC)   PVD (peripheral vascular disease) (HCC)   CKD (chronic kidney disease)   DM2 (diabetes mellitus, type 2) (HCC)   Cellulitis of right foot   Necrotic toes (HCC)   Gangrene of the right foot: Presented with discoloration, swelling of the right foot.  X-ray did not show  any evidence of osteomyelitis.  Vascular surgery following.  Underwent  lower  extremity angiogram stenting of  right femoral-popliteal artery ,  right tibioperoneal trunk and Balloon angioplasty on 02/12/20.  Continue current  antibiotics.  Follow-up cultures.  Currently is afebrile, hemodynamically stable.  Has mild leukocytosis. Dr. Sharol Given planning for right transmetatarsal amputation tomorrow. 02/15/2020: Patient is status post transmetatarsal amputation of the right foot. 02/16/2020: Postop management as per orthopedic surgery team.  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  We will continue his home medications on discharge.  ESRD on dialysis: Dialyzed on TTS schedule.  Nephrology following for dialysis 02/16/2020: Patient seen on hemodialysis today.  Nephrology input is appreciated.  Diabetes type 2: Recent hemoglobinA1C of 6.1.  Monitor blood sugars.  Continue current regimen  Normocytic anemia: Secondary to ESRD.  Continue to monitor.  Currently hemoglobin stable.  History of CLL: Follows with Dr. Learta Codding.  Outpatient follow-up recommended  Thrombocytopenia: Continue to monitor.  Stable  Chronic hypotension: On midodrine  Hyperlipidemia: On Pravachol  DVT prophylaxis:Heparin [place] Code Status: Full Family Communication: Called wife for update today Status is: Inpatient  Remains inpatient appropriate because:Inpatient level of care appropriate due to severity of illness   Dispo: The patient is from: Home              Anticipated d/c is to: Home              Anticipated d/c date is: 2-3 days              Patient currently is not medically stable to d/c.  Difficult to place patient No     Consultants: Vascular surgery, nephrology  Procedures: as above, dialysis  Antimicrobials:  Anti-infectives (From admission, onward)   Start     Dose/Rate Route Frequency Ordered Stop   02/16/20 1259  vancomycin (VANCOCIN) 500-5 MG/100ML-% IVPB        Note to Pharmacy: Judieth Keens  : cabinet override      02/16/20 1259 02/16/20 1301   02/15/20 0630  ceFAZolin (ANCEF) IVPB 2g/100 mL premix        2 g 200 mL/hr over 30 Minutes Intravenous 30 min pre-op 02/14/20 1942 02/15/20 0747   02/15/20 0600  ceFAZolin (ANCEF) IVPB 2g/100 mL premix  Status:  Discontinued        2 g 200 mL/hr over 30 Minutes Intravenous On call to O.R. 02/14/20 1944 02/14/20 1948   02/14/20 1200  vancomycin (VANCOREADY) IVPB 500 mg/100 mL        500 mg 100 mL/hr over 60 Minutes Intravenous Every T-Th-Sa (Hemodialysis) 02/13/20 1347 02/16/20 1404   02/13/20 1445  vancomycin (VANCOREADY) IVPB 500 mg/100 mL        500 mg 100 mL/hr over 60 Minutes Intravenous  Once 02/13/20 1346 02/13/20 1718   02/12/20 1200  vancomycin (VANCOCIN) IVPB 1000 mg/200 mL premix  Status:  Discontinued        1,000 mg 200 mL/hr over 60 Minutes Intravenous Every T-Th-Sa (Hemodialysis) 02/10/20 1904 02/13/20 1347   02/11/20 0000  ceFEPIme (MAXIPIME) 1 g in sodium chloride 0.9 % 100 mL IVPB        1 g 200 mL/hr over 30 Minutes Intravenous Daily-1800 02/10/20 1904 02/16/20 2359   02/10/20 1630  vancomycin (VANCOREADY) IVPB 2000 mg/400 mL        2,000 mg 200 mL/hr over 120 Minutes Intravenous STAT 02/10/20 1619 02/10/20 1929   02/10/20 1630  piperacillin-tazobactam (ZOSYN) IVPB 3.375 g        3.375 g 100 mL/hr over 30 Minutes Intravenous STAT 02/10/20 1619 02/10/20 1707      Subjective:  Patient seen and examined the bedside this morning.  Hemodynamically stable.  Denies any complaints  Objective: Vitals:   02/16/20 1130 02/16/20 1405 02/16/20 1438 02/16/20 1618  BP:    (!) 112/52  Pulse:   89 90  Resp:   17 16  Temp: 98.3 F (36.8 C) 97.8 F (36.6 C) 98.4 F (36.9 C)   TempSrc: Oral Oral Oral   SpO2:   100% 95%  Weight:  91 kg    Height:        Intake/Output Summary (Last 24 hours) at 02/16/2020 1754 Last data filed at 02/16/2020 1405 Gross per 24 hour  Intake 10 ml   Output 2000 ml  Net -1990 ml   Filed Weights   02/16/20 0525 02/16/20 1125 02/16/20 1405  Weight: 92.3 kg 93.3 kg 91 kg    Examination:  General exam: Comfortable, very pleasant gentleman Respiratory system: Clear to auscultation.   Cardiovascular system: S1 & S2  Gastrointestinal system: Abdomen is nondistended, soft and nontender. No organomegaly or masses felt. Normal bowel sounds heard. Central nervous system: Alert and oriented. No focal neurological deficits. Extremities: No edema.  Status post right transmetatarsal amputation.  Status post left first, second and third to the amputation.       Data Reviewed: I have personally reviewed following labs and imaging studies  CBC: Recent Labs  Lab 02/10/20 1612 02/11/20 0235 02/12/20 2243 02/13/20 0249 02/14/20 1633 02/15/20 0256 02/16/20 1210  WBC 13.8*   < > 14.2* 13.7* 14.3* 15.7* 14.0*  NEUTROABS 4.5  --   --  4.0  --  3.5  --   HGB 12.0*   < > 11.4* 10.9* 11.0* 11.3* 10.7*  HCT 40.9   < > 36.0* 36.4* 36.9* 37.7* 34.8*  MCV 90.1   < > 86.3 88.6 88.7 88.5 89.0  PLT 138*   < > 129* 124* 128* 117* 123*   < > = values in this interval not displayed.   Basic Metabolic Panel: Recent Labs  Lab 02/12/20 2243 02/13/20 0249 02/14/20 1633 02/15/20 0256 02/16/20 1210  NA 133* 136 133* 135 133*  K 4.0 3.8 3.8 4.2 4.2  CL 97* 96* 94* 96* 96*  CO2 21* 23 24 24 23   GLUCOSE 87 137* 143* 118* 152*  BUN 59* 61* 47* 29* 45*  CREATININE 6.69* 6.81* 6.46* 4.98* 6.57*  CALCIUM 8.7* 9.0 8.9 9.1 8.8*  PHOS 5.9*  --  5.2*  --  5.8*   GFR: Estimated Creatinine Clearance: 12.1 mL/min (A) (by C-G formula based on SCr of 6.57 mg/dL (H)). Liver Function Tests: Recent Labs  Lab 02/11/20 0235 02/12/20 2243 02/14/20 1633 02/16/20 1210  AST 32  --   --   --   ALT 33  --   --   --   ALKPHOS 56  --   --   --   BILITOT 1.4*  --   --   --   PROT 6.0*  --   --   --   ALBUMIN 3.3* 2.8* 2.8* 2.8*   No results for input(s):  LIPASE, AMYLASE in the last 168 hours. No results for input(s): AMMONIA in the last 168 hours. Coagulation Profile: No results for input(s): INR, PROTIME in the last 168 hours. Cardiac Enzymes: No results for input(s): CKTOTAL, CKMB, CKMBINDEX, TROPONINI in the last 168 hours. BNP (last 3 results) No results for input(s): PROBNP in the last 8760 hours. HbA1C: No results for input(s): HGBA1C in the last 72 hours. CBG: Recent Labs  Lab 02/15/20 1140 02/15/20 1646 02/15/20 2123 02/16/20 0751 02/16/20 1117  GLUCAP 119* 176* 141* 137* 144*   Lipid Profile: No results for input(s): CHOL, HDL, LDLCALC, TRIG, CHOLHDL, LDLDIRECT in the last 72 hours. Thyroid Function Tests: No results for input(s): TSH, T4TOTAL, FREET4, T3FREE, THYROIDAB in the last 72 hours. Anemia Panel: No results for input(s): VITAMINB12, FOLATE, FERRITIN, TIBC, IRON, RETICCTPCT in the last 72 hours. Sepsis Labs: No results for input(s): PROCALCITON, LATICACIDVEN in the last 168 hours.  Recent Results (from the past 240 hour(s))  SARS Coronavirus 2 by RT PCR (hospital order, performed in [place] hospital lab)     Status: None   Collection Time: 02/10/20  7:00 PM  Result Value Ref Range Status   SARS Coronavirus 2 NEGATIVE NEGATIVE Final    Comment: (NOTE) SARS-CoV-2 target nucleic acids are NOT DETECTED.  The SARS-CoV-2 RNA is generally detectable in upper and lower respiratory specimens during the acute phase of infection. The lowest concentration of SARS-CoV-2 viral copies this assay can detect is 250 copies / mL. A negative result does not preclude SARS-CoV-2 infection and should not be used as the sole basis for treatment or other patient management decisions.  A negative result may occur with improper specimen collection / handling, submission of specimen other than nasopharyngeal swab, presence of viral mutation(s) within the areas targeted by this assay, and inadequate number of viral copies (<250  copies /  mL). A negative result must be combined with clinical observations, patient history, and epidemiological information.  Fact Sheet for Patients:   StrictlyIdeas.no  Fact Sheet for Healthcare Providers: BankingDealers.co.za  This test is not yet approved or  cleared by the [place] FDA and has been authorized for detection and/or diagnosis of SARS-CoV-2 by FDA under an Emergency Use Authorization (EUA).  This EUA will remain in effect (meaning this test can be used) for the duration of the COVID-19 declaration under Section 564(b)(1) of the Act, 21 U.S.C. section 360bbb-3(b)(1), unless the authorization is terminated or revoked sooner.  Performed at [facility], [place] [address], [place], [place] [address]   MRSA PCR Screening     Status: None   Collection Time: 02/11/20 11:07 PM   Specimen: Nasopharyngeal  Result Value Ref Range Status   MRSA by PCR NEGATIVE NEGATIVE Final    Comment:        The GeneXpert MRSA Assay (FDA approved for NASAL specimens only), is one component of a comprehensive MRSA colonization surveillance program. It is not intended to diagnose MRSA infection nor to guide or monitor treatment for MRSA infections. Performed at [facility], [place] [address] [place], [place] [address]   Culture, blood (routine x 2)     Status: None (Preliminary result)   Collection Time: 02/12/20 11:08 PM   Specimen: BLOOD  Result Value Ref Range Status   Specimen Description BLOOD LEFT UPPER HAND  Final   Special Requests   Final    BOTTLES DRAWN AEROBIC ONLY Blood Culture adequate volume   Culture   Final    NO GROWTH 3 DAYS Performed at [facility], [place] [address], [place], [place] [address]    Report Status PENDING  Incomplete  Culture, blood (routine x 2)     Status: None (Preliminary result)   Collection Time: 02/12/20 11:08 PM   Specimen: BLOOD  Result Value Ref Range  Status   Specimen Description BLOOD LEFT LOWER HAND  Final   Special Requests   Final    BOTTLES DRAWN AEROBIC ONLY Blood Culture adequate volume   Culture   Final    NO GROWTH 3 DAYS Performed at [facility], [place] [address], [place], [place] [address]    Report Status PENDING  Incomplete  Surgical pcr screen     Status: None   Collection Time: 02/14/20  9:08 PM   Specimen: Nasal Mucosa; Nasal Swab  Result Value Ref Range Status   MRSA, PCR NEGATIVE NEGATIVE Final   Staphylococcus aureus NEGATIVE NEGATIVE Final    Comment: (NOTE) The Xpert SA Assay (FDA approved for NASAL specimens in patients 70 years of age and older), is one component of a comprehensive surveillance program. It is not intended to diagnose infection nor to guide or monitor treatment. Performed at [facility], [place] [address], [place],  [address]          Radiology Studies: DG Chest 2 View  Result Date: 02/14/2020 CLINICAL DATA:  Preop transmetatarsal amputation. EXAM: CHEST - 2 VIEW COMPARISON:  11/07/2019 FINDINGS: Right-sided dialysis catheter with tip in the lower SVC. Chronic cardiomegaly with unchanged mediastinal contours. Small bilateral pleural effusions, right greater than left. Vascular congestion appears similar. Suspicion of mild pulmonary edema with septal thickening peripherally. No pneumothorax or confluent airspace disease. IMPRESSION: 1. Right-sided dialysis catheter with tip in the lower SVC. 2. Small bilateral pleural effusions, right greater than left, with vascular congestion  and mild pulmonary edema. Electronically Signed   By: Keith Rake M.D.   On: 02/14/2020 21:33        Scheduled Meds: . aspirin EC  81 mg Oral Daily  . calcitRIOL  0.25 mcg Oral Q M,W,F  . Chlorhexidine Gluconate Cloth  6 each Topical Q0600  . Chlorhexidine Gluconate Cloth  6 each Topical Q0600  . clopidogrel  75 mg Oral Daily  . cyanocobalamin  1,000 mcg Intramuscular Q30 days  .  docusate sodium  100 mg Oral BID  . folic acid  1 mg Oral Daily  . heparin  5,000 Units Subcutaneous Q8H  . insulin aspart  0-15 Units Subcutaneous TID WC  . insulin aspart  0-5 Units Subcutaneous QHS  . midodrine  10 mg Oral Q T,Th,Sa-HD  . multivitamin  1 tablet Oral QHS  . pravastatin  40 mg Oral QHS   Continuous Infusions: . sodium chloride 75 mL/hr at 02/15/20 1210  . ceFEPime (MAXIPIME) IV 1 g (02/15/20 1701)     LOS: 6 days    Time spent: 25 mins.   Bonnell Public, MD [facility] P2/05/2020, 5:54 PM

## 2020-02-16 NOTE — Plan of Care (Signed)
  Problem: Nutritional: Goal: Ability to make healthy dietary choices will improve Outcome: Progressing   Problem: Clinical Measurements: Goal: Ability to maintain clinical measurements within normal limits will improve Outcome: Progressing   Problem: Nutrition: Goal: Adequate nutrition will be maintained Outcome: Progressing   Problem: Coping: Goal: Level of anxiety will decrease Outcome: Progressing   Problem: Activity: Goal: Ability to perform//tolerate increased activity and mobilize with assistive devices will improve Outcome: Progressing

## 2020-02-17 DIAGNOSIS — L03031 Cellulitis of right toe: Secondary | ICD-10-CM | POA: Diagnosis not present

## 2020-02-17 DIAGNOSIS — L02611 Cutaneous abscess of right foot: Secondary | ICD-10-CM | POA: Diagnosis not present

## 2020-02-17 LAB — GLUCOSE, CAPILLARY
Glucose-Capillary: 146 mg/dL — ABNORMAL HIGH (ref 70–99)
Glucose-Capillary: 154 mg/dL — ABNORMAL HIGH (ref 70–99)
Glucose-Capillary: 126 mg/dL — ABNORMAL HIGH (ref 70–99)
Glucose-Capillary: 156 mg/dL — ABNORMAL HIGH (ref 70–99)

## 2020-02-17 MED ORDER — ALUM & MAG HYDROXIDE-SIMETH 200-200-20 MG/5ML PO SUSP
15.0000 mL | ORAL | Status: DC | PRN
  Administered 2020-02-17 – 2020-02-19 (×4): 15 mL via ORAL
  Filled 2020-02-17 (×4): qty 30

## 2020-02-17 MED ORDER — ALUM & MAG HYDROXIDE-SIMETH 200-200-20 MG/5ML PO SUSP
30.0000 mL | Freq: Once | ORAL | Status: AC
  Administered 2020-02-17: 30 mL via TOPICAL
  Filled 2020-02-17: qty 30

## 2020-02-17 NOTE — Progress Notes (Addendum)
[facility] Progress Note   Subjective:   Patient seen and examined at bedside.  Doing well.  Pain in RLE off and on but mostly well controlled.  Tolerated dialysis well yesterday.  Denies CP, SOB, n/v/d, and abdominal pain  Objective Vitals:   02/16/20 1438 02/16/20 1618 02/17/20 0514 02/17/20 0835  BP:  (!) 112/52 (!) 111/59 97/61  Pulse: 89 90 89 94  Resp: 17 16 (!) 21 20  Temp: 98.4 F (36.9 C)  99.1 F (37.3 C) 98.3 F (36.8 C)  TempSrc: Oral  Oral Oral  SpO2: 100% 95% 97% 96%  Weight:   91.1 kg   Height:       Physical Exam General:Well appearing male in NAD Heart:RRR, no mrg Lungs:mostly CTAB, breath sounds decreased at bases, nml WOB on RA Abdomen:soft, NTND Extremities:no LE edema Dialysis Access: [facility], RU AVF +b/t   Filed Weights   02/16/20 1125 02/16/20 1405 02/17/20 0514  Weight: 93.3 kg 91 kg 91.1 kg    Intake/Output Summary (Last 24 hours) at 02/17/2020 0955 Last data filed at 02/16/2020 1405 Gross per 24 hour  Intake 10 ml  Output 2000 ml  Net -1990 ml    Additional Objective Labs: Basic Metabolic Panel: Recent Labs  Lab 02/12/20 2243 02/13/20 0249 02/14/20 1633 02/15/20 0256 02/16/20 1210  NA 133*   < > 133* 135 133*  K 4.0   < > 3.8 4.2 4.2  CL 97*   < > 94* 96* 96*  CO2 21*   < > 24 24 23   GLUCOSE 87   < > 143* 118* 152*  BUN 59*   < > 47* 29* 45*  CREATININE 6.69*   < > 6.46* 4.98* 6.57*  CALCIUM 8.7*   < > 8.9 9.1 8.8*  PHOS 5.9*  --  5.2*  --  5.8*   < > = values in this interval not displayed.   Liver Function Tests: Recent Labs  Lab 02/11/20 0235 02/12/20 2243 02/14/20 1633 02/16/20 1210  AST 32  --   --   --   ALT 33  --   --   --   ALKPHOS 56  --   --   --   BILITOT 1.4*  --   --   --   PROT 6.0*  --   --   --   ALBUMIN 3.3* 2.8* 2.8* 2.8*   CBC: Recent Labs  Lab 02/10/20 1612 02/11/20 0235 02/12/20 2243 02/13/20 0249 02/14/20 1633 02/15/20 0256 02/16/20 1210  WBC 13.8*   < > 14.2* 13.7* 14.3*  15.7* 14.0*  NEUTROABS 4.5  --   --  4.0  --  3.5  --   HGB 12.0*   < > 11.4* 10.9* 11.0* 11.3* 10.7*  HCT 40.9   < > 36.0* 36.4* 36.9* 37.7* 34.8*  MCV 90.1   < > 86.3 88.6 88.7 88.5 89.0  PLT 138*   < > 129* 124* 128* 117* 123*   < > = values in this interval not displayed.   Blood Culture    Component Value Date/Time   SDES BLOOD LEFT UPPER HAND 02/12/2020 2308   SDES BLOOD LEFT LOWER HAND 02/12/2020 2308   SPECREQUEST  02/12/2020 2308    BOTTLES DRAWN AEROBIC ONLY Blood Culture adequate volume   SPECREQUEST  02/12/2020 2308    BOTTLES DRAWN AEROBIC ONLY Blood Culture adequate volume   CULT  02/12/2020 2308    NO GROWTH 3 DAYS Performed at [facility]  [facility], [place] [address] [place], [place] [address]    CULT  02/12/2020 2308    NO GROWTH 3 DAYS Performed at [place] [address] [place], [place] [address]    REPTSTATUS PENDING 02/12/2020 2308   REPTSTATUS PENDING 02/12/2020 2308    CBG: Recent Labs  Lab 02/16/20 0751 02/16/20 1117 02/16/20 1701 02/16/20 2129 02/17/20 0823  GLUCAP 137* 144* 177* 138* 146*    Medications: . sodium chloride 75 mL/hr at 02/15/20 1210   . aspirin EC  81 mg Oral Daily  . calcitRIOL  0.25 mcg Oral Q M,W,F  . Chlorhexidine Gluconate Cloth  6 each Topical Q0600  . Chlorhexidine Gluconate Cloth  6 each Topical Q0600  . clopidogrel  75 mg Oral Daily  . cyanocobalamin  1,000 mcg Intramuscular Q30 days  . docusate sodium  100 mg Oral BID  . folic acid  1 mg Oral Daily  . heparin  5,000 Units Subcutaneous Q8H  . insulin aspart  0-15 Units Subcutaneous TID WC  . insulin aspart  0-5 Units Subcutaneous QHS  . midodrine  10 mg Oral Q T,Th,Sa-HD  . multivitamin  1 tablet Oral QHS  . pravastatin  40 mg Oral QHS    Dialysis Orders: Center:[facility]. 180NRe, 4 hours, BFR 400, DFR 800, EDW 85kg, 2K/2.5Ca, TDC, no heparin No ESA Calcitriol 0.75 mcg PO q HD  Assessment/Plan: 1. Gangrene of R foot/PAD: [facility]VS  following,s/parteriogram on 2/2/22and TMA 02/15/20 by Dr. Sharol Given.Antibiotics per primary team.VVS recommend plavix and ASA on d/c with f/u of fem pop bypass with ABI in 1 month.  TMA per Dr. Sharol Given.  2. ESRD:TTS schedule.  Dialysis treatments are currently shortened due to high census/nursing shortage.  Next HD 02/19/20. 3. Hypotension/volume:BP controlled.Continue midodrine prior to HD.No edema on exam.UF as tolerated. 4. Anemia:Hgb at goal, last 10.7. No ESA indicated at this time. 5. Metabolic bone disease:Calcium controlled.Phosslightly elevated today.Continue calcitriol. No binder on outpt med list. If phosphorus remains high will need to start a non-calcium based binder. 6. T2DM:Mgt per primary team 7. Thrombocytopenia: Stable, no heparin with HD   Jen Mow, PA-C [place] 02/17/2020,9:55 AM  LOS: 7 days   I have seen and examined this patient and agree with plan and assessment in the above note with renal recommendations/intervention highlighted.  Broadus John A Latina Frank,MD 02/17/2020 12:00 PM

## 2020-02-17 NOTE — Progress Notes (Signed)
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  He was found to have gangrene of the second, third and fourth right toes.  On presentation he was hemodynamically stable.  X-rays of the foot showed no evidence of osteomyelitis but large linear metallic foreign body in the plantar soft tissue of the foot and on the mid first metatarsal.  Started on broad spectrum antibiotics.  Patient was transferred from [facility] to [facility] for  continuation of dialysis.  He underwent  lower extremity arteriogram with  intervention on 02/12/20.  Nephrology following for dialysis.  Patient underwent right metatarsal amputation by Dr. Sharol Given on 02/15/2020.    02/17/2020: Patient seen alongside patient's wife.  Patient has continued to remain stable.  PT OT input is appreciated.  Patient will be discharge back home with home health PT and 24 hours supervision once cleared for discharge by the orthopedic team.  Next hemodialysis will be on Tuesday.  Patient is on TTS schedule.  Assessment & Plan:   Principal Problem:   Cellulitis and abscess of toe of right foot Active Problems:   Chronic lymphocytic leukemia (CLL), B-cell (HCC)   PVD (peripheral vascular disease) (HCC)   CKD (chronic kidney disease)   DM2 (diabetes mellitus, type 2) (HCC)   Cellulitis of right foot   Necrotic toes (HCC)   Gangrene of the right foot: Presented with discoloration, swelling of the right foot.  X-ray did not show  any evidence of  osteomyelitis.  Vascular surgery following.  Underwent  lower extremity angiogram stenting of  right femoral-popliteal artery ,  right tibioperoneal trunk and Balloon angioplasty on 02/12/20.  Continue current  antibiotics.  Follow-up cultures.  Currently is afebrile, hemodynamically stable.  Has mild leukocytosis. Dr. Sharol Given planning for right transmetatarsal amputation tomorrow. 02/17/2020: Patient is status post transmetatarsal amputation of the right foot.  Please see above documentation.  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  We will continue his home medications on discharge. 02/17/2020: Patient will follow vascular surgery team on discharge.  ESRD on dialysis:  Dialyzed on TTS schedule.   Next hemodialysis on Tuesday.   Diabetes type 2: Recent hemoglobinA1C of 6.1.  Monitor blood sugars.  Continue current regimen  Normocytic anemia: Secondary to ESRD.  Continue to monitor.  Currently hemoglobin stable.  History of CLL: Follows with Dr. Learta Codding.  Outpatient follow-up recommended  Thrombocytopenia: Continue to monitor.  Stable  Chronic hypotension: On midodrine  Hyperlipidemia: On Pravachol  DVT prophylaxis:Heparin [place] Code Status: Full Family Communication: Called wife for update today Status is: Inpatient  Remains inpatient appropriate because:Inpatient level of care appropriate due to severity of illness   Dispo: The patient is from: Home              Anticipated d/c is to: Home              Anticipated d/c date is: 2-3 days  Patient currently is not medically stable to d/c.   Difficult to place patient No  Consultants: Vascular surgery, nephrology and orthopedic surgery  Procedures: Right metatarsal amputation.  Antimicrobials:  Anti-infectives (From admission, onward)   Start     Dose/Rate Route Frequency Ordered Stop   02/16/20 1259   vancomycin (VANCOCIN) 500-5 MG/100ML-% IVPB       Note to Pharmacy: Judieth Keens  : cabinet override      02/16/20 1259 02/16/20 1301   02/15/20 0630  ceFAZolin (ANCEF) IVPB 2g/100 mL premix        2 g 200 mL/hr over 30 Minutes Intravenous 30 min pre-op 02/14/20 1942 02/15/20 0747   02/15/20 0600  ceFAZolin (ANCEF) IVPB 2g/100 mL premix  Status:  Discontinued        2 g 200 mL/hr over 30 Minutes Intravenous On call to O.R. 02/14/20 1944 02/14/20 1948   02/14/20 1200  vancomycin (VANCOREADY) IVPB 500 mg/100 mL        500 mg 100 mL/hr over 60 Minutes Intravenous Every T-Th-Sa (Hemodialysis) 02/13/20 1347 02/16/20 1404   02/13/20 1445  vancomycin (VANCOREADY) IVPB 500 mg/100 mL        500 mg 100 mL/hr over 60 Minutes Intravenous  Once 02/13/20 1346 02/13/20 1718   02/12/20 1200  vancomycin (VANCOCIN) IVPB 1000 mg/200 mL premix  Status:  Discontinued        1,000 mg 200 mL/hr over 60 Minutes Intravenous Every T-Th-Sa (Hemodialysis) 02/10/20 1904 02/13/20 1347   02/11/20 0000  ceFEPIme (MAXIPIME) 1 g in sodium chloride 0.9 % 100 mL IVPB        1 g 200 mL/hr over 30 Minutes Intravenous Daily-1800 02/10/20 1904 02/16/20 2359   02/10/20 1630  vancomycin (VANCOREADY) IVPB 2000 mg/400 mL        2,000 mg 200 mL/hr over 120 Minutes Intravenous STAT 02/10/20 1619 02/10/20 1929   02/10/20 1630  piperacillin-tazobactam (ZOSYN) IVPB 3.375 g        3.375 g 100 mL/hr over 30 Minutes Intravenous STAT 02/10/20 1619 02/10/20 1707      Subjective:  Patient seen and examined the bedside this morning.  Hemodynamically stable.  Denies any complaints  Objective: Vitals:   02/16/20 1438 02/16/20 1618 02/17/20 0514 02/17/20 0835  BP:  (!) 112/52 (!) 111/59 97/61  Pulse: 89 90 89 94  Resp: 17 16 (!) 21 20  Temp: 98.4 F (36.9 C)  99.1 F (37.3 C) 98.3 F (36.8 C)  TempSrc: Oral  Oral Oral  SpO2: 100% 95% 97% 96%  Weight:   91.1 kg   Height:        Intake/Output Summary (Last 24 hours) at  02/17/2020 1812 Last data filed at 02/17/2020 1400 Gross per 24 hour  Intake 200 ml  Output --  Net 200 ml   Filed Weights   02/16/20 1125 02/16/20 1405 02/17/20 0514  Weight: 93.3 kg 91 kg 91.1 kg    Examination:  General exam: Comfortable, very pleasant gentleman Respiratory system: Clear to auscultation.   Cardiovascular system: S1 & S2  Gastrointestinal system: Abdomen is nondistended, soft and nontender. No organomegaly or masses felt. Normal bowel sounds heard. Central nervous system: Alert and oriented. No focal neurological deficits. Extremities: No edema.  Status post right transmetatarsal amputation.  Status post left first, second and third to the amputation.       Data Reviewed: I have personally reviewed following labs and imaging studies  CBC: Recent Labs  Lab 02/12/20 2243 02/13/20  3382 02/14/20 1633 02/15/20 0256 02/16/20 1210  WBC 14.2* 13.7* 14.3* 15.7* 14.0*  NEUTROABS  --  4.0  --  3.5  --   HGB 11.4* 10.9* 11.0* 11.3* 10.7*  HCT 36.0* 36.4* 36.9* 37.7* 34.8*  MCV 86.3 88.6 88.7 88.5 89.0  PLT 129* 124* 128* 117* 505*   Basic Metabolic Panel: Recent Labs  Lab 02/12/20 2243 02/13/20 0249 02/14/20 1633 02/15/20 0256 02/16/20 1210  NA 133* 136 133* 135 133*  K 4.0 3.8 3.8 4.2 4.2  CL 97* 96* 94* 96* 96*  CO2 21* 23 24 24 23   GLUCOSE 87 137* 143* 118* 152*  BUN 59* 61* 47* 29* 45*  CREATININE 6.69* 6.81* 6.46* 4.98* 6.57*  CALCIUM 8.7* 9.0 8.9 9.1 8.8*  PHOS 5.9*  --  5.2*  --  5.8*   GFR: Estimated Creatinine Clearance: 12.1 mL/min (A) (by C-G formula based on SCr of 6.57 mg/dL (H)). Liver Function Tests: Recent Labs  Lab 02/11/20 0235 02/12/20 2243 02/14/20 1633 02/16/20 1210  AST 32  --   --   --   ALT 33  --   --   --   ALKPHOS 56  --   --   --   BILITOT 1.4*  --   --   --   PROT 6.0*  --   --   --   ALBUMIN 3.3* 2.8* 2.8* 2.8*   No results for input(s): LIPASE, AMYLASE in the last 168 hours. No results for input(s): AMMONIA  in the last 168 hours. Coagulation Profile: No results for input(s): INR, PROTIME in the last 168 hours. Cardiac Enzymes: No results for input(s): CKTOTAL, CKMB, CKMBINDEX, TROPONINI in the last 168 hours. BNP (last 3 results) No results for input(s): PROBNP in the last 8760 hours. HbA1C: No results for input(s): HGBA1C in the last 72 hours. CBG: Recent Labs  Lab 02/16/20 1701 02/16/20 2129 02/17/20 0823 02/17/20 1309 02/17/20 1645  GLUCAP 177* 138* 146* 154* 126*   Lipid Profile: No results for input(s): CHOL, HDL, LDLCALC, TRIG, CHOLHDL, LDLDIRECT in the last 72 hours. Thyroid Function Tests: No results for input(s): TSH, T4TOTAL, FREET4, T3FREE, THYROIDAB in the last 72 hours. Anemia Panel: No results for input(s): VITAMINB12, FOLATE, FERRITIN, TIBC, IRON, RETICCTPCT in the last 72 hours. Sepsis Labs: No results for input(s): PROCALCITON, LATICACIDVEN in the last 168 hours.  Recent Results (from the past 240 hour(s))  SARS Coronavirus 2 by RT PCR (hospital order, performed in [place] hospital lab)     Status: None   Collection Time: 02/10/20  7:00 PM  Result Value Ref Range Status   SARS Coronavirus 2 NEGATIVE NEGATIVE Final    Comment: (NOTE) SARS-CoV-2 target nucleic acids are NOT DETECTED.  The SARS-CoV-2 RNA is generally detectable in upper and lower respiratory specimens during the acute phase of infection. The lowest concentration of SARS-CoV-2 viral copies this assay can detect is 250 copies / mL. A negative result does not preclude SARS-CoV-2 infection and should not be used as the sole basis for treatment or other patient management decisions.  A negative result may occur with improper specimen collection / handling, submission of specimen other than nasopharyngeal swab, presence of viral mutation(s) within the areas targeted by this assay, and inadequate number of viral copies (<250 copies / mL). A negative result must be combined with  clinical observations, patient history, and epidemiological information.  Fact Sheet for Patients:   StrictlyIdeas.no  Fact Sheet for Healthcare Providers: BankingDealers.co.za  This  test is not yet approved or  cleared by the [place] and has been authorized for detection and/or diagnosis of SARS-CoV-2 by FDA under an Emergency Use Authorization (EUA).  This EUA will remain in effect (meaning this test can be used) for the duration of the COVID-19 declaration under Section 564(b)(1) of the Act, 21 U.S.C. section 360bbb-3(b)(1), unless the authorization is terminated or revoked sooner.  Performed at [facility], [place] [address], [place], [place] [address]   MRSA PCR Screening     Status: None   Collection Time: 02/11/20 11:07 PM   Specimen: Nasopharyngeal  Result Value Ref Range Status   MRSA by PCR NEGATIVE NEGATIVE Final    Comment:        The GeneXpert MRSA Assay (FDA approved for NASAL specimens only), is one component of a comprehensive MRSA colonization surveillance program. It is not intended to diagnose MRSA infection nor to guide or monitor treatment for MRSA infections. Performed at [facility], [place] [address] [place], [place] [address]   Culture, blood (routine x 2)     Status: None (Preliminary result)   Collection Time: 02/12/20 11:08 PM   Specimen: BLOOD  Result Value Ref Range Status   Specimen Description BLOOD LEFT UPPER HAND  Final   Special Requests   Final    BOTTLES DRAWN AEROBIC ONLY Blood Culture adequate volume   Culture   Final    NO GROWTH 4 DAYS Performed at [facility], [place] [address] [place], [place] [address]    Report Status PENDING  Incomplete  Culture, blood (routine x 2)     Status: None (Preliminary result)   Collection Time: 02/12/20 11:08 PM   Specimen: BLOOD  Result Value Ref Range Status   Specimen Description BLOOD LEFT LOWER HAND   Final   Special Requests   Final    BOTTLES DRAWN AEROBIC ONLY Blood Culture adequate volume   Culture   Final    NO GROWTH 4 DAYS Performed at [facility], [place] [address] [place], [place] [address]    Report Status PENDING  Incomplete  Surgical pcr screen     Status: None   Collection Time: 02/14/20  9:08 PM   Specimen: Nasal Mucosa; Nasal Swab  Result Value Ref Range Status   MRSA, PCR NEGATIVE NEGATIVE Final   Staphylococcus aureus NEGATIVE NEGATIVE Final    Comment: (NOTE) The Xpert SA Assay (FDA approved for NASAL specimens in patients 54 years of age and older), is one component of a comprehensive surveillance program. It is not intended to diagnose infection nor to guide or monitor treatment. Performed at [facility], [place] [address], [place], [place] [address]          Radiology Studies: No results found.      Scheduled Meds: . aspirin EC  81 mg Oral Daily  . calcitRIOL  0.25 mcg Oral Q M,W,F  . Chlorhexidine Gluconate Cloth  6 each Topical Q0600  . Chlorhexidine Gluconate Cloth  6 each Topical Q0600  . clopidogrel  75 mg Oral Daily  . cyanocobalamin  1,000 mcg Intramuscular Q30 days  . docusate sodium  100 mg Oral BID  . folic acid  1 mg Oral Daily  . heparin  5,000 Units Subcutaneous Q8H  . insulin aspart  0-15 Units Subcutaneous TID WC  . insulin aspart  0-5 Units Subcutaneous QHS  . midodrine  10 mg Oral Q T,Th,Sa-HD  .  multivitamin  1 tablet Oral QHS  . pravastatin  40 mg Oral QHS   Continuous Infusions: . sodium chloride 75 mL/hr at 02/15/20 1210     LOS: 7 days    Time spent: 25 mins.   Bonnell Public, MD [facility] P2/06/2020, 6:12 PM

## 2020-02-18 DIAGNOSIS — L02611 Cutaneous abscess of right foot: Secondary | ICD-10-CM | POA: Diagnosis not present

## 2020-02-18 DIAGNOSIS — L03031 Cellulitis of right toe: Secondary | ICD-10-CM | POA: Diagnosis not present

## 2020-02-18 LAB — CULTURE, BLOOD (ROUTINE X 2)
Culture: NO GROWTH
Culture: NO GROWTH
Special Requests: ADEQUATE
Special Requests: ADEQUATE

## 2020-02-18 LAB — CBC WITH DIFFERENTIAL/PLATELET
Abs Immature Granulocytes: 0 10*3/uL (ref 0.00–0.07)
Basophils Absolute: 0 10*3/uL (ref 0.0–0.1)
Basophils Relative: 0 %
Eosinophils Absolute: 0.1 10*3/uL (ref 0.0–0.5)
Eosinophils Relative: 1 %
HCT: 33.5 % — ABNORMAL LOW (ref 39.0–52.0)
Hemoglobin: 10 g/dL — ABNORMAL LOW (ref 13.0–17.0)
Lymphocytes Relative: 68 %
Lymphs Abs: 9.8 10*3/uL — ABNORMAL HIGH (ref 0.7–4.0)
MCH: 26.3 pg (ref 26.0–34.0)
MCHC: 29.9 g/dL — ABNORMAL LOW (ref 30.0–36.0)
MCV: 88.2 fL (ref 80.0–100.0)
Monocytes Absolute: 0.3 10*3/uL (ref 0.1–1.0)
Monocytes Relative: 2 %
Neutro Abs: 4.2 10*3/uL (ref 1.7–7.7)
Neutrophils Relative %: 29 %
Platelets: 122 10*3/uL — ABNORMAL LOW (ref 150–400)
RBC: 3.8 MIL/uL — ABNORMAL LOW (ref 4.22–5.81)
RDW: 19 % — ABNORMAL HIGH (ref 11.5–15.5)
WBC: 14.4 10*3/uL — ABNORMAL HIGH (ref 4.0–10.5)
nRBC: 0 /100 WBC
nRBC: 0.1 % (ref 0.0–0.2)

## 2020-02-18 LAB — RENAL FUNCTION PANEL
Albumin: 2.5 g/dL — ABNORMAL LOW (ref 3.5–5.0)
Anion gap: 13 (ref 5–15)
BUN: 47 mg/dL — ABNORMAL HIGH (ref 8–23)
CO2: 25 mmol/L (ref 22–32)
Calcium: 8.7 mg/dL — ABNORMAL LOW (ref 8.9–10.3)
Chloride: 96 mmol/L — ABNORMAL LOW (ref 98–111)
Creatinine, Ser: 6.69 mg/dL — ABNORMAL HIGH (ref 0.61–1.24)
GFR, Estimated: 8 mL/min — ABNORMAL LOW (ref >60)
Glucose, Bld: 154 mg/dL — ABNORMAL HIGH (ref 70–99)
Phosphorus: 5.7 mg/dL — ABNORMAL HIGH (ref 2.5–4.6)
Potassium: 4.4 mmol/L (ref 3.5–5.1)
Sodium: 134 mmol/L — ABNORMAL LOW (ref 135–145)

## 2020-02-18 LAB — HEPATITIS B SURFACE ANTIBODY, QUANTITATIVE: Hep B S AB Quant (Post): <3.1 m[IU]/mL — ABNORMAL LOW (ref >9.9)

## 2020-02-18 LAB — GLUCOSE, CAPILLARY
Glucose-Capillary: 123 mg/dL — ABNORMAL HIGH (ref 70–99)
Glucose-Capillary: 174 mg/dL — ABNORMAL HIGH (ref 70–99)
Glucose-Capillary: 138 mg/dL — ABNORMAL HIGH (ref 70–99)
Glucose-Capillary: 136 mg/dL — ABNORMAL HIGH (ref 70–99)

## 2020-02-18 MED ORDER — FERRIC CITRATE 1 GM 210 MG(FE) PO TABS
420.0000 mg | ORAL_TABLET | Freq: Three times a day (TID) | ORAL | Status: DC
  Administered 2020-02-18 – 2020-02-19 (×3): 420 mg via ORAL
  Filled 2020-02-18 (×4): qty 2

## 2020-02-18 NOTE — NC FL2 (Signed)
[place] LEVEL OF CARE SCREENING TOOL     IDENTIFICATION  Patient Name: Thomas Mcguire Birthdate: 05-17-1940 Sex: male Admission Date (Current Location): 02/10/2020  [facility] and [place] Number:  Herbalist and Address:  [facility] [place], [place] [address] [place], [place] [address]      Provider Number: [id number]  Attending Physician Name and Address:  Shawna Clamp, MD  Relative Name and Phone Number:  Vira Agar  [phone]    Current Level of Care: Hospital Recommended Level of Care: [place] Prior Approval Number:    Date Approved/Denied:   PASRR Number: [id number]  Discharge Plan: SNF    Current Diagnoses: Patient Active Problem List   Diagnosis Date Noted  . Necrotic toes ([place]) 02/11/2020  . Cellulitis and abscess of toe of right foot 02/10/2020  . Cellulitis of right foot 02/10/2020  . Fluid overload 11/07/2019  . Cardiomegaly 11/07/2019  . DM2 (diabetes mellitus, type 2) ([place]) 11/07/2019  . CKD (chronic kidney disease) 10/06/2018  . PVD (peripheral vascular disease) ([place]) 07/11/2018  . B12 deficiency 11/30/2017  . Folic acid deficiency 35/70/1779  . Chronic lymphocytic leukemia (CLL), B-cell ([place]) 11/30/2017    Orientation RESPIRATION BLADDER Height & Weight     Self,Time,Situation,Place  Normal Continent Weight: 206 lb (93.4 kg) Height:  6\' 7"  (200.7 cm)  BEHAVIORAL SYMPTOMS/MOOD NEUROLOGICAL BOWEL NUTRITION STATUS      Continent Diet (See discharge summary)  AMBULATORY STATUS COMMUNICATION OF NEEDS Skin   Limited Assist Verbally Surgical wounds (Amputation foot right,Incision closed foot right, negative pressure wound therapy foot right, dressing in place)                       Personal Care Assistance Level of Assistance  Bathing,Feeding,Dressing Bathing Assistance: Limited assistance Feeding assistance: Independent (able to feed self;Cardiac) Dressing Assistance: Limited  assistance     Functional Limitations Info  Sight,Hearing,Speech Sight Info: Adequate Hearing Info: Adequate Speech Info: Adequate    SPECIAL CARE FACTORS FREQUENCY  PT (By licensed PT),OT (By licensed OT)     PT Frequency: 5x min weekly OT Frequency: 5x min weekly            Contractures Contractures Info: Not present    Additional Factors Info  Code Status,Insulin Sliding Scale Code Status Info: FULL     Insulin Sliding Scale Info: insulin aspart (novoLOG) injection 0-15 Units 3 times daily with meals,insulin aspart (novoLOG) injection 0-5 Units daily at bedtime,       Current Medications (02/18/2020):  This is the current hospital active medication list Current Facility-Administered Medications  Medication Dose Route Frequency Provider Last Rate Last Admin  . 0.9 %  sodium chloride infusion   Intravenous Continuous Persons, Bevely Palmer, [place] 75 mL/hr at 02/15/20 1210 New Bag at 02/15/20 1210  . acetaminophen (TYLENOL) tablet 650 mg  650 mg Oral Q6H PRN Elwyn Reach, MD       Or  . acetaminophen (TYLENOL) suppository 650 mg  650 mg Rectal Q6H PRN Elwyn Reach, MD      . alum & mag hydroxide-simeth (MAALOX/MYLANTA) 200-200-20 MG/5ML suspension 15 mL  15 mL Oral Q4H PRN Dana Allan I, MD   15 mL at 02/17/20 2044  . aspirin EC tablet 81 mg  81 mg Oral Daily [name], MD   81 mg at 02/18/20 0834  . calcitRIOL (ROCALTROL) capsule 0.25 mcg  0.25 mcg Oral Q M,W,F Gala Romney  L, MD   0.25 mcg at 02/18/20 0834  . Chlorhexidine Gluconate Cloth 2 % PADS 6 each  6 each Topical Q0600 Janalee Dane, PA-C   6 each at 02/18/20 1234  . Chlorhexidine Gluconate Cloth 2 % PADS 6 each  6 each Topical Q0600 Penninger, [place], [place]   6 each at 02/16/20 [phone]  . clopidogrel (PLAVIX) tablet 75 mg  75 mg Oral Daily [name], MD   75 mg at 02/18/20 0834  . cyanocobalamin ((VITAMIN B-12)) injection 1,000 mcg  1,000 mcg Intramuscular Q30 days Elwyn Reach, MD   1,000 mcg at 02/11/20 1158  . docusate sodium (COLACE) capsule 100 mg  100 mg Oral BID Persons, Bevely Palmer, PA   100 mg at 02/18/20 0834  . ferric citrate (AURYXIA) tablet 420 mg  420 mg Oral TID WC Ernest Haber, PA-C   420 mg at 02/18/20 1247  . folic acid (FOLVITE) tablet 1 mg  1 mg Oral Daily Gala Romney L, MD   1 mg at 02/18/20 0834  . heparin injection 5,000 Units  5,000 Units Subcutaneous Q8H Persons, Bevely Palmer, PA   5,000 Units at 02/18/20 1404  . HYDROmorphone (DILAUDID) injection 0.5 mg  0.5 mg Intravenous Q4H PRN Persons, Bevely Palmer, PA   0.5 mg at 02/18/20 1026  . insulin aspart (novoLOG) injection 0-15 Units  0-15 Units Subcutaneous TID WC Elwyn Reach, MD   3 Units at 02/18/20 1247  . insulin aspart (novoLOG) injection 0-5 Units  0-5 Units Subcutaneous QHS Garba, Mohammad L, MD      . insulin NPH Human (NOVOLIN N) injection 50 Units  50 Units Subcutaneous Daily PRN Elwyn Reach, MD      . metoCLOPramide (REGLAN) tablet 5-10 mg  5-10 mg Oral Q8H PRN Persons, Bevely Palmer, PA       Or  . metoCLOPramide (REGLAN) injection 5-10 mg  5-10 mg Intravenous Q8H PRN Persons, Bevely Palmer, PA      . midodrine (PROAMATINE) tablet 10 mg  10 mg Oral Q T,Th,Sa-HD Collins, Samantha G, PA-C   10 mg at 02/14/20 1419  . multivitamin (RENA-VIT) tablet 1 tablet  1 tablet Oral QHS Elwyn Reach, MD   1 tablet at 02/17/20 2045  . ondansetron (ZOFRAN) tablet 4 mg  4 mg Oral Q6H PRN Elwyn Reach, MD       Or  . ondansetron (ZOFRAN) injection 4 mg  4 mg Intravenous Q6H PRN Gala Romney L, MD      . oxyCODONE (Oxy IR/ROXICODONE) immediate release tablet 5-10 mg  5-10 mg Oral Q4H PRN Persons, Bevely Palmer, PA      . pravastatin (PRAVACHOL) tablet 40 mg  40 mg Oral QHS Elwyn Reach, MD   40 mg at 02/17/20 2045  . traZODone (DESYREL) tablet 50 mg  50 mg Oral QHS PRN Shalhoub, Sherryll Burger, MD   50 mg at 02/18/20 0032     Discharge Medications: Please see discharge summary for a list  of discharge medications.  Relevant Imaging Results:  Relevant Lab Results:   Additional Information [id number]  Trula Ore, LCSWA

## 2020-02-18 NOTE — TOC Progression Note (Signed)
Transition of Care [facility]) - Progression Note    Patient Details  Name: Thomas Mcguire MRN: [id number] Date of Birth: 1940/08/08  Transition of Care [facility]) CM/SW B[place], Gaylord Phone Number: 02/18/2020, 2:35 PM  Clinical Narrative:     CSW started insurance authorization for patient. Reference number is #[id number].    SNF choice pending. Insurance authorization pending.  CSW will continue to follow.     Expected Discharge Plan: [place] Barriers to Discharge: Continued Medical Work up  Expected Discharge Plan and Services Expected Discharge Plan: [place] arrangements for the past 2 months: Single Family Home                                       Social Determinants of Health (SDOH) Interventions    Readmission Risk Interventions Readmission Risk Prevention Plan 07/14/2018  Transportation Screening Complete  PCP or Specialist Appt within 5-7 Days Complete  Home Care Screening Complete  Medication Review (RN CM) Complete  Some recent data might be hidden

## 2020-02-18 NOTE — Evaluation (Signed)
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  He had arteriorgram with intervention on 02/12/20 and underwent transmet amputation 02/15/20.   Clinical Impression   Pt was ambulating with a cane and independent in self care prior to admission. Presents with generalized weakness and decreased activity tolerance. Pt declined OOB to chair, offered to assist with stand-pivot or lateral scoot. Per PT note, pt unable to maintain NWB on R LE with transfer. Began educating pt in compensatory strategies for ADL adhering to NWB status. Pt receptive, but concerned he will not be able to manage at home. Agreeable to post acute rehab if necessary. Pt has steps to negotiate to get into his home and must be able to get in and out to go to HD. Recommending SNF.     Follow Up Recommendations  SNF    Equipment Recommendations  None recommended by OT    Recommendations for Other Services       Precautions / Restrictions Precautions Precautions: Fall Precaution Comments: wound vac Restrictions Weight Bearing Restrictions: Yes RLE Weight Bearing: Non weight bearing      Mobility Bed Mobility Overal bed mobility: Needs Assistance Bed Mobility: Supine to Sit;Sit to Supine     Supine to sit: Supervision Sit to supine: Supervision        Transfers                 General transfer comment: refused pivot with RW and lateral scoot    Balance Overall balance assessment: Needs assistance   Sitting balance-Leahy Scale: Good                                      ADL either performed or assessed with clinical judgement   ADL Overall ADL's : Needs assistance/impaired Eating/Feeding: Independent;Bed level   Grooming: Set up;Bed level;Wash/dry hands;Wash/dry face   Upper Body Bathing: Set up;Sitting   Lower Body Bathing: Moderate assistance;Bed level   Upper Body Dressing : Set up;Sitting   Lower Body Dressing: Moderate assistance;Bed level                 General ADL Comments: pt declined OOB, citing weakness, educated in importance of activity to combat weakness, pt wants to speak to Dr Sharol Given before he gets [place] Patient Visual Report: No change from baseline       Perception     Praxis      Pertinent Vitals/Pain Pain Assessment: Faces Faces Pain Scale: Hurts little more Pain Location: R foot Pain Descriptors / Indicators: Throbbing Pain Intervention(s): Monitored during session;Repositioned     Hand Dominance Right   Extremity/Trunk Assessment Upper Extremity Assessment Upper Extremity Assessment: Generalized weakness;Overall [facility] for tasks assessed   Lower Extremity Assessment Lower Extremity Assessment: Defer to PT evaluation       Communication Communication Communication: No difficulties   Cognition Arousal/Alertness: Awake/alert Behavior During Therapy: WFL for tasks assessed/performed Overall Cognitive Status: Within Functional Limits for tasks assessed  General Comments: pt with concerns about being able to manage at home   General Comments       Exercises     Shoulder Instructions      Home Living Family/patient expects to be discharged to:: Private residence Living Arrangements: Spouse/significant other Available Help at Discharge: Family;Available 24 hours/day Type of Home: House Home Access: Stairs to enter [employer] of Steps: 3-4 Entrance Stairs-Rails: Can reach both Home Layout: One level      Bathroom Shower/Tub: Occupational psychologist: Standard     Home Equipment: Environmental consultant - 2 wheels;Shower seat;Wheelchair - manual;Grab bars - tub/shower;Bedside commode          Prior Functioning/Environment Level of Independence: Independent with assistive device(s)        Comments: using a cane intermittently        OT Problem List: Decreased strength;Decreased activity tolerance;Impaired balance (sitting and/or standing);Decreased knowledge of use of DME or AE;Pain      OT Treatment/Interventions: Self-care/ADL training;Therapeutic activities;Patient/family education;Balance training;DME and/or AE instruction    OT Goals(Current goals can be found in the care plan section) Acute Rehab OT Goals Patient Stated Goal: to get stronger before going home OT Goal Formulation: With patient Time For Goal Achievement: 03/03/20 Potential to Achieve Goals: Good ADL Goals Pt Will Perform Lower Body Bathing: with supervision;sitting/lateral leans Pt Will Perform Lower Body Dressing: with supervision;sitting/lateral leans Pt Will Transfer to Toilet: with supervision;bedside commode (lateral scoot vs squat pivot) Pt Will Perform Toileting - Clothing Manipulation and hygiene: with supervision;sitting/lateral leans  OT Frequency: Min 2X/week   Barriers to D/C: Inaccessible home environment  pt not likely to be able to manage steps with NWB status       Co-evaluation              AM-PAC OT "6 Clicks" Daily Activity     Outcome Measure Help from another person eating meals?: None Help from another person taking care of personal grooming?: A Little Help from another person toileting, which includes using toliet, bedpan, or urinal?: A Lot Help from another person bathing (including washing, rinsing, drying)?: A Lot Help from another person to put on and taking off regular upper body clothing?: A Little Help from another person to put on and taking off regular lower body  clothing?: A Lot 6 Click Score: 16   End of Session    Activity Tolerance: Patient limited by fatigue Patient left: in bed;with call bell/phone within reach;with bed alarm set  OT Visit Diagnosis: Muscle weakness (generalized) (M62.81);Pain Pain - Right/Left: Right Pain - part of body: Ankle and joints of foot                Time: 1505-6979 OT Time Calculation (min): 21 min Charges:  OT General Charges $OT Visit: 1 Visit OT Evaluation $OT Eval Moderate Complexity: 1 Mod  Nestor Lewandowsky, OTR/L Acute Rehabilitation Services Pager: [phone] Office: [phone]  Malka So 02/18/2020, 9:21 AM

## 2020-02-18 NOTE — TOC Initial Note (Signed)
Transition of Care [facility]) - Initial/Assessment Note    Patient Details  Name: Thomas Mcguire MRN: [id number] Date of Birth: 10/16/1940  [facility] CM/SW Contact:    Trula Ore, [place] Phone Number: 02/18/2020, 2:03 PM  Clinical Narrative:                  CSW received consult for possible SNF placement at time of discharge. CSW spoke with patient regarding PT recommendation of SNF placement at time of discharge. Patient reported that patient's spouse is currently unable to care for patient at their home given patient's current physical needs and fall risk. Patient expressed understanding of PT recommendation and is agreeable to SNF placement at time of discharge. Patient gave CSW permission to fax out initial referral near [place] and [place] area . CSW discussed insurance authorization process Patient has received the COVID vaccines as well as booster. Patient gave CSW permission to discuss hjs care with his spouse Vira Agar.Patient expressed being hopeful for rehab and to feel better soon. No further questions reported at this time. CSW to continue to follow and assist with discharge planning needs.  Expected Discharge Plan: Skilled Nursing Facility Barriers to Discharge: Continued Medical Work up   Patient Goals and CMS Choice Patient states their goals for this hospitalization and ongoing recovery are:: SNF CMS Medicare.gov Compare Post Acute Care list provided to:: Patient Choice offered to / list presented to : Patient  Expected Discharge Plan and Services Expected Discharge Plan: [place]       Living arrangements for the past 2 months: Single Family Home                                      Prior Living Arrangements/Services Living arrangements for the past 2 months: Single Family Home Lives with:: Self,Spouse Patient language and need for interpreter reviewed:: Yes Do you feel safe going back to the place where you  live?: No   SNF  Need for Family Participation in Patient Care: Yes (Comment) Care giver support system in place?: Yes (comment)   Criminal Activity/Legal Involvement Pertinent to Current Situation/Hospitalization: No - Comment as needed  Activities of Daily Living Home Assistive Devices/Equipment: Cane (specify quad or straight),Dentures (specify type),Walker (specify type),Shower chair without back,Other (Comment),CBG Meter (walk-in shower, front wheeled walker, single point cane, upper/lower dentures) ADL Screening (condition at time of admission) Patient's cognitive ability adequate to safely complete daily activities?: Yes Is the patient deaf or have difficulty hearing?: No Does the patient have difficulty seeing, even when wearing glasses/contacts?: No Does the patient have difficulty concentrating, remembering, or making decisions?: No Patient able to express need for assistance with ADLs?: Yes Does the patient have difficulty dressing or bathing?: No Independently performs ADLs?: No Communication: Independent Dressing (OT): Independent Grooming: Independent Feeding: Independent Bathing: Independent Toileting: Independent with device (comment) In/Out Bed: Independent Walks in Home: Independent with device (comment) Does the patient have difficulty walking or climbing stairs?: Yes (secondary to right foot pain) Weakness of Legs: Right Weakness of Arms/Hands: None  Permission Sought/Granted Permission sought to share information with : Case Manager,Family Chief Financial Officer Permission granted to share information with : Yes, Verbal Permission Granted  Share Information with NAME: Vira Agar  Permission granted to share info w AGENCY: SNF  Permission granted to share info w Relationship: spouse  Permission granted to share info w Contact Information: Vira Agar  [phone]  Emotional Assessment Appearance:: Appears stated age Attitude/Demeanor/Rapport:  Gracious Affect (typically observed): Calm Orientation: : Oriented to Self,Oriented to Place,Oriented to  Time,Oriented to Situation Alcohol / Substance Use: Not Applicable Psych Involvement: No (comment)  Admission diagnosis:  Gangrene ([place]) [I96] PVD (peripheral vascular disease) ([place]) [I73.9] Cellulitis of right foot [L03.115] Cellulitis of right lower extremity [L03.115] ESRD on hemodialysis ([place]) [N18.6, Z99.2] Necrotic toes ([facility]) [I96] Cellulitis and abscess of toe of left foot [L03.032, L02.612] Patient Active Problem List   Diagnosis Date Noted  . Necrotic toes ([place]) 02/11/2020  . Cellulitis and abscess of toe of right foot 02/10/2020  . Cellulitis of right foot 02/10/2020  . Fluid overload 11/07/2019  . Cardiomegaly 11/07/2019  . DM2 (diabetes mellitus, type 2) ([place]) 11/07/2019  . CKD (chronic kidney disease) 10/06/2018  . PVD (peripheral vascular disease) ([place]) 07/11/2018  . B12 deficiency 11/30/2017  . Folic acid deficiency 00/86/7619  . 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 [address] [place] [address] Phone: [phone] Fax: [phone]     Social Determinants of Health (SDOH) Interventions    Readmission Risk Interventions Readmission Risk Prevention Plan 07/14/2018  Transportation Screening Complete  PCP or Specialist Appt within 5-7 Days Complete  Home Care Screening Complete  Medication Review (RN CM) Complete  Some recent data might be hidden

## 2020-02-18 NOTE — Plan of Care (Signed)

## 2020-02-18 NOTE — Progress Notes (Addendum)
Subjective: No current complaints, plan for dialysis tomorrow, noted OT in room.  Per patient he lives at home with his elderly wife not sure if he can manage at home with her  Objective Vital signs in last 24 hours: Vitals:   02/17/20 0835 02/17/20 2015 02/18/20 0414 02/18/20 0831  BP: 97/61 (!) 108/57 (!) 108/58 (!) 105/55  Pulse: 94 91 85 87  Resp: 20 20 15 18   Temp: 98.3 F (36.8 C) 98.2 F (36.8 C) 98.2 F (36.8 C) 98.5 F (36.9 C)  TempSrc: Oral Oral Oral Oral  SpO2: 96% 100% 100% 99%  Weight:   93.4 kg   Height:       Weight change: 0.141 kg  Physical Exam General: Alert, elderly male, NAD Heart:RRR, no mrg Lungs: CTAB, breath sounds decreased at bases, nml WOB on RA Abdomen:soft, NTND Extremities:no LE edema, right transmit wound VAC placed Dialysis Access: TDC, RU AVF +b/t     Dialysis Orders: Center:[facility] TTS. 180NRe, 4 hours, BFR 400, DFR 800, EDW 85kg, 2K/2.5Ca, TDC, no heparin No ESA Calcitriol 0.75 mcg PO q HD   Problem/Plan: 1. Gangrene of R foot/PAD: [facility]VS following,s/parteriogram on 2/2/22and TMA2/4/22by Dr. Sharol Given.Antibiotics per primary team.VVS recommend plavix and ASA on d/c with f/u of fem pop bypass with ABI in 1 month. TMA per Dr. Sharol Given.  2. ESRD:TTS schedule. HD is shortened due to high census/nursing shortage.  Next HD 02/19/20.  We will schedule 3. Hypotension/volume:BP controlled.Continue midodrine prior to HD.No edema on exam.UF as tolerated. 4. Anemia:Hgb 10.0 follow-up trend no ESA indicated at this time. Start ESA if lower than 10.5 tomorrow 5. Metabolic bone disease:Calcium controlled.Phos5.7, start phosphate binder Auryxia, continue calcitriol. 6. T2DM:Mgt per primary team 7. Thrombocytopenia: Stable, 122, no heparin with HD   Ernest Haber, PA-C [place] [phone] 02/18/2020,10:24 AM  LOS: 8 days   Pt seen, examined and agree w A/P as above.  Kelly Splinter  MD 02/18/2020, 5:19  PM    Labs: Basic Metabolic Panel: Recent Labs  Lab 02/14/20 1633 02/15/20 0256 02/16/20 1210 02/18/20 0130  NA 133* 135 133* 134*  K 3.8 4.2 4.2 4.4  CL 94* 96* 96* 96*  CO2 24 24 23 25   GLUCOSE 143* 118* 152* 154*  BUN 47* 29* 45* 47*  CREATININE 6.46* 4.98* 6.57* 6.69*  CALCIUM 8.9 9.1 8.8* 8.7*  PHOS 5.2*  --  5.8* 5.7*   Liver Function Tests: Recent Labs  Lab 02/14/20 1633 02/16/20 1210 02/18/20 0130  ALBUMIN 2.8* 2.8* 2.5*   No results for input(s): LIPASE, AMYLASE in the last 168 hours. No results for input(s): AMMONIA in the last 168 hours. CBC: Recent Labs  Lab 02/13/20 0249 02/14/20 1633 02/15/20 0256 02/16/20 1210 02/18/20 0130  WBC 13.7* 14.3* 15.7* 14.0* 14.4*  NEUTROABS 4.0  --  3.5  --  4.2  HGB 10.9* 11.0* 11.3* 10.7* 10.0*  HCT 36.4* 36.9* 37.7* 34.8* 33.5*  MCV 88.6 88.7 88.5 89.0 88.2  PLT 124* 128* 117* 123* 122*   Cardiac Enzymes: No results for input(s): CKTOTAL, CKMB, CKMBINDEX, TROPONINI in the last 168 hours. CBG: Recent Labs  Lab 02/17/20 0823 02/17/20 1309 02/17/20 1645 02/17/20 2148 02/18/20 0742  GLUCAP 146* 154* 126* 156* 123*    Studies/Results: No results found. Medications: . sodium chloride 75 mL/hr at 02/15/20 1210   . aspirin EC  81 mg Oral Daily  . calcitRIOL  0.25 mcg Oral Q M,W,F  . Chlorhexidine Gluconate Cloth  6 each Topical  [id number]  . Chlorhexidine Gluconate Cloth  6 each Topical Q0600  . clopidogrel  75 mg Oral Daily  . cyanocobalamin  1,000 mcg Intramuscular Q30 days  . docusate sodium  100 mg Oral BID  . folic acid  1 mg Oral Daily  . heparin  5,000 Units Subcutaneous Q8H  . insulin aspart  0-15 Units Subcutaneous TID WC  . insulin aspart  0-5 Units Subcutaneous QHS  . midodrine  10 mg Oral Q T,Th,Sa-HD  . multivitamin  1 tablet Oral QHS  . pravastatin  40 mg Oral QHS

## 2020-02-18 NOTE — Progress Notes (Addendum)
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  He was found to have gangrene of the second, third and fourth right toes.  On presentation he was hemodynamically stable.  X-rays of the foot showed no evidence of osteomyelitis but large linear metallic foreign body in the plantar soft tissue of the foot and on the mid first metatarsal.  Started on broad spectrum antibiotics. Patient was transferred from [facility] to [facility] for  continuation of dialysis.  He underwent  lower extremity arteriogram with  intervention on 02/12/20.  Nephrology following for dialysis.  Patient underwent right metatarsal amputation by Dr. Sharol Given on 02/15/2020.    Assessment & Plan:   Principal Problem:   Cellulitis and abscess of toe of right foot Active Problems:   Chronic lymphocytic leukemia (CLL), B-cell (HCC)   PVD (peripheral vascular disease) (HCC)   CKD (chronic kidney disease)   DM2 (diabetes mellitus, type 2) (HCC)   Cellulitis of right foot   Necrotic toes (HCC)   Gangrene of the right foot:  Patient presented with discoloration, swelling of the right foot.   X-ray did not show any evidence of osteomyelitis.  Vascular surgery following.  He underwent  lower extremity angiogram stenting of  right femoral-popliteal artery ,  right tibioperoneal trunk and Balloon angioplasty on 02/12/20.   Continue current  antibiotics.  Follow-up cultures.  Currently he is afebrile, hemodynamically stable.  Has mild leukocytosis. Dr. Sharol Given recommended right transmetatarsal amputation  . 02/17/2020: Patient is status post transmetatarsal amputation of the right foot.   02/18/20: Wound Vac noted. PT SNF awaiting ortho clearance.  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   We will continue his home medications on discharge. 02/17/2020: Patient will follow vascular surgery team on discharge.  ESRD on dialysis:  Dialyzed on TTS schedule.   Next hemodialysis on Tuesday.   Diabetes type 2: Recent hemoglobin A1C of 6.1.  Monitor blood sugars.  Continue current regimen  Normocytic anemia: Secondary to ESRD.  Continue to monitor.  Currently hemoglobin stable.  History of CLL: Follows with Dr. Learta Codding.  Outpatient follow-up recommended  Thrombocytopenia: Continue to monitor.  Stable  Chronic hypotension: On midodrine  Hyperlipidemia: On Pravachol   DVT prophylaxis: Heparin sq Code Status: Full code. Family Communication: No family at bed side. Disposition Plan:   Status is: Inpatient  Remains inpatient appropriate because:Inpatient level of care appropriate due to severity of illness   Dispo: The patient is from: Home              Anticipated d/c is to: Home              Anticipated d/c date is: 2 days              Patient currently is not medically stable to d/c.   Difficult to place patient No    Consultants:   Orthopedics  Procedures: Right metatarsal amputation. Antimicrobials:  Anti-infectives (From admission, onward)   Start     Dose/Rate Route Frequency  Ordered Stop   02/16/20 1259  vancomycin (VANCOCIN) 500-5 MG/100ML-% IVPB       Note to Pharmacy: Judieth Keens  : cabinet override      02/16/20 1259 02/16/20 1301   02/15/20 0630  ceFAZolin (ANCEF) IVPB 2g/100 mL premix        2 g 200 mL/hr over 30 Minutes Intravenous 30 min pre-op 02/14/20 1942 02/15/20 0747   02/15/20 0600  ceFAZolin (ANCEF) IVPB 2g/100 mL premix   Status:  Discontinued        2 g 200 mL/hr over 30 Minutes Intravenous On call to O.R. 02/14/20 1944 02/14/20 1948   02/14/20 1200  vancomycin (VANCOREADY) IVPB 500 mg/100 mL        500 mg 100 mL/hr over 60 Minutes Intravenous Every T-Th-Sa (Hemodialysis) 02/13/20 1347 02/16/20 1404   02/13/20 1445  vancomycin (VANCOREADY) IVPB 500 mg/100 mL        500 mg 100 mL/hr over 60 Minutes Intravenous  Once 02/13/20 1346 02/13/20 1718   02/12/20 1200  vancomycin (VANCOCIN) IVPB 1000 mg/200 mL premix  Status:  Discontinued        1,000 mg 200 mL/hr over 60 Minutes Intravenous Every T-Th-Sa (Hemodialysis) 02/10/20 1904 02/13/20 1347   02/11/20 0000  ceFEPIme (MAXIPIME) 1 g in sodium chloride 0.9 % 100 mL IVPB        1 g 200 mL/hr over 30 Minutes Intravenous Daily-1800 02/10/20 1904 02/16/20 2359   02/10/20 1630  vancomycin (VANCOREADY) IVPB 2000 mg/400 mL        2,000 mg 200 mL/hr over 120 Minutes Intravenous STAT 02/10/20 1619 02/10/20 1929   02/10/20 1630  piperacillin-tazobactam (ZOSYN) IVPB 3.375 g        3.375 g 100 mL/hr over 30 Minutes Intravenous STAT 02/10/20 1619 02/10/20 1707     Subjective: Patient was seen and examined at bedside,  Overnight events noted.   Patient reports feeling better,  states patient is having pain but which is better controlled.  Objective: Vitals:   02/18/20 0414 02/18/20 0831 02/18/20 1024 02/18/20 1222  BP: (!) 108/58 (!) 105/55  93/77  Pulse: 85 87 89 89  Resp: 15 18 18 17   Temp: 98.2 F (36.8 C) 98.5 F (36.9 C)  98.1 F (36.7 C)  TempSrc: Oral Oral  Oral  SpO2: 100% 99% 98% 99%  Weight: 93.4 kg     Height:        Intake/Output Summary (Last 24 hours) at 02/18/2020 1550 Last data filed at 02/18/2020 1400 Gross per 24 hour  Intake 852.96 ml  Output 200 ml  Net 652.96 ml   Filed Weights   02/16/20 1405 02/17/20 0514 02/18/20 0414  Weight: 91 kg 91.1 kg 93.4 kg    Examination:  General exam: Appears calm and comfortable, Not in any acute  distress. Respiratory system: Clear to auscultation. Respiratory effort normal. Cardiovascular system: S1 & S2 heard, RRR. No JVD, murmurs, rubs, gallops or clicks. No pedal edema. Gastrointestinal system: Abdomen is nondistended, soft and nontender. No organomegaly or masses felt. Normal bowel sounds heard. Central nervous system: Alert and oriented. No focal neurological deficits. Extremities: Symmetric 5 x 5 power. Right front toes amputated, Wound vac noted. Skin: No rashes, lesions or ulcers Psychiatry: Judgement and insight appear normal. Mood & affect appropriate.     Data Reviewed: I have personally reviewed following labs and imaging studies  CBC: Recent Labs  Lab 02/13/20 0249 02/14/20 1633 02/15/20 0256 02/16/20 1210 02/18/20 0130  WBC 13.7*  14.3* 15.7* 14.0* 14.4*  NEUTROABS 4.0  --  3.5  --  4.2  HGB 10.9* 11.0* 11.3* 10.7* 10.0*  HCT 36.4* 36.9* 37.7* 34.8* 33.5*  MCV 88.6 88.7 88.5 89.0 88.2  PLT 124* 128* 117* 123* 466*   Basic Metabolic Panel: Recent Labs  Lab 02/12/20 2243 02/13/20 0249 02/14/20 1633 02/15/20 0256 02/16/20 1210 02/18/20 0130  NA 133* 136 133* 135 133* 134*  K 4.0 3.8 3.8 4.2 4.2 4.4  CL 97* 96* 94* 96* 96* 96*  CO2 21* 23 24 24 23 25   GLUCOSE 87 137* 143* 118* 152* 154*  BUN 59* 61* 47* 29* 45* 47*  CREATININE 6.69* 6.81* 6.46* 4.98* 6.57* 6.69*  CALCIUM 8.7* 9.0 8.9 9.1 8.8* 8.7*  PHOS 5.9*  --  5.2*  --  5.8* 5.7*   GFR: Estimated Creatinine Clearance: 12.2 mL/min (A) (by C-G formula based on SCr of 6.69 mg/dL (H)). Liver Function Tests: Recent Labs  Lab 02/12/20 2243 02/14/20 1633 02/16/20 1210 02/18/20 0130  ALBUMIN 2.8* 2.8* 2.8* 2.5*   No results for input(s): LIPASE, AMYLASE in the last 168 hours. No results for input(s): AMMONIA in the last 168 hours. Coagulation Profile: No results for input(s): INR, PROTIME in the last 168 hours. Cardiac Enzymes: No results for input(s): CKTOTAL, CKMB, CKMBINDEX, TROPONINI in  the last 168 hours. BNP (last 3 results) No results for input(s): PROBNP in the last 8760 hours. HbA1C: No results for input(s): HGBA1C in the last 72 hours. CBG: Recent Labs  Lab 02/17/20 1309 02/17/20 1645 02/17/20 2148 02/18/20 0742 02/18/20 1119  GLUCAP 154* 126* 156* 123* 174*   Lipid Profile: No results for input(s): CHOL, HDL, LDLCALC, TRIG, CHOLHDL, LDLDIRECT in the last 72 hours. Thyroid Function Tests: No results for input(s): TSH, T4TOTAL, FREET4, T3FREE, THYROIDAB in the last 72 hours. Anemia Panel: No results for input(s): VITAMINB12, FOLATE, FERRITIN, TIBC, IRON, RETICCTPCT in the last 72 hours. Sepsis Labs: No results for input(s): PROCALCITON, LATICACIDVEN in the last 168 hours.  Recent Results (from the past 240 hour(s))  SARS Coronavirus 2 by RT PCR (hospital order, performed in [place] hospital lab)     Status: None   Collection Time: 02/10/20  7:00 PM  Result Value Ref Range Status   SARS Coronavirus 2 NEGATIVE NEGATIVE Final    Comment: (NOTE) SARS-CoV-2 target nucleic acids are NOT DETECTED.  The SARS-CoV-2 RNA is generally detectable in upper and lower respiratory specimens during the acute phase of infection. The lowest concentration of SARS-CoV-2 viral copies this assay can detect is 250 copies / mL. A negative result does not preclude SARS-CoV-2 infection and should not be used as the sole basis for treatment or other patient management decisions.  A negative result may occur with improper specimen collection / handling, submission of specimen other than nasopharyngeal swab, presence of viral mutation(s) within the areas targeted by this assay, and inadequate number of viral copies (<250 copies / mL). A negative result must be combined with clinical observations, patient history, and epidemiological information.  Fact Sheet for Patients:   StrictlyIdeas.no  Fact Sheet for Healthcare  Providers: BankingDealers.co.za  This test is not yet approved or  cleared by the [place] FDA and has been authorized for detection and/or diagnosis of SARS-CoV-2 by FDA under an Emergency Use Authorization (EUA).  This EUA will remain in effect (meaning this test can be used) for the duration of the COVID-19 declaration under Section 564(b)(1) of the Act, 21 U.S.C. section 360bbb-3(b)(1),  unless the authorization is terminated or revoked sooner.  Performed at [facility], [place] [address] [place], [place] [address]   MRSA PCR Screening     Status: None   Collection Time: 02/11/20 11:07 PM   Specimen: Nasopharyngeal  Result Value Ref Range Status   MRSA by PCR NEGATIVE NEGATIVE Final    Comment:        The GeneXpert MRSA Assay (FDA approved for NASAL specimens only), is one component of a comprehensive MRSA colonization surveillance program. It is not intended to diagnose MRSA infection nor to guide or monitor treatment for MRSA infections. Performed at [facility], [place] [address] [place], [place] [address]   Culture, blood (routine x 2)     Status: None   Collection Time: 02/12/20 11:08 PM   Specimen: BLOOD  Result Value Ref Range Status   Specimen Description BLOOD LEFT UPPER HAND  Final   Special Requests   Final    BOTTLES DRAWN AEROBIC ONLY Blood Culture adequate volume   Culture   Final    NO GROWTH 5 DAYS Performed at [facility], [place] [address], [place], [place] [address]    Report Status 02/18/2020 FINAL  Final  Culture, blood (routine x 2)     Status: None   Collection Time: 02/12/20 11:08 PM   Specimen: BLOOD  Result Value Ref Range Status   Specimen Description BLOOD LEFT LOWER HAND  Final   Special Requests   Final    BOTTLES DRAWN AEROBIC ONLY Blood Culture adequate volume   Culture   Final    NO GROWTH 5 DAYS Performed at [facility], [place] [address], [place], [place] [address]     Report Status 02/18/2020 FINAL  Final  Surgical pcr screen     Status: None   Collection Time: 02/14/20  9:08 PM   Specimen: Nasal Mucosa; Nasal Swab  Result Value Ref Range Status   MRSA, PCR NEGATIVE NEGATIVE Final   Staphylococcus aureus NEGATIVE NEGATIVE Final    Comment: (NOTE) The Xpert SA Assay (FDA approved for NASAL specimens in patients 45 years of age and older), is one component of a comprehensive surveillance program. It is not intended to diagnose infection nor to guide or monitor treatment. Performed at [facility], [place] [address] [place], [place] [address]     Radiology Studies: No results found.  Scheduled Meds: . aspirin EC  81 mg Oral Daily  . calcitRIOL  0.25 mcg Oral Q M,W,F  . Chlorhexidine Gluconate Cloth  6 each Topical Q0600  . Chlorhexidine Gluconate Cloth  6 each Topical Q0600  . clopidogrel  75 mg Oral Daily  . cyanocobalamin  1,000 mcg Intramuscular Q30 days  . docusate sodium  100 mg Oral BID  . ferric citrate  420 mg Oral TID WC  . folic acid  1 mg Oral Daily  . heparin  5,000 Units Subcutaneous Q8H  . insulin aspart  0-15 Units Subcutaneous TID WC  . insulin aspart  0-5 Units Subcutaneous QHS  . midodrine  10 mg Oral Q T,Th,Sa-HD  . multivitamin  1 tablet Oral QHS  . pravastatin  40 mg Oral QHS   Continuous Infusions: . sodium chloride 75 mL/hr at 02/15/20 1210     LOS: 8 days    Time spent: 25 mins    Ekin Pilar, MD [facility]   If 7PM-7AM, please contact night-coverage

## 2020-02-18 NOTE — Progress Notes (Signed)
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  He had arteriorgram with intervention on 02/12/20 and underwent transmet amputation 02/15/20.    PT Comments    Pt was seen for progression of movement, with new R foot transmet amp, not interested in trying to walk today. Talked with him about using a crutch and stair rail for easiest management, but per pt this is not expected to be much trouble.  Pt did note his seeping of blood on R foot the day of surgery.  Continue on for goals of PT and focus on gait of stairs, NWB on RLE and control of transitions to stand and walk.   Follow Up Recommendations  Home health PT;Supervision/Assistance - 24 hour     Equipment Recommendations  None recommended by PT    Recommendations for Other Services       Precautions / Restrictions Precautions Precautions: Fall Precaution Comments: wound vac Required Braces or Orthoses: Other Brace Other Brace: post op shoe Restrictions Weight Bearing Restrictions: Yes RLE Weight Bearing: Non weight bearing    Mobility  Bed Mobility Overal bed mobility: Needs Assistance Bed Mobility: Rolling Rolling: Min guard         General bed mobility comments: declines to move on the bed,  Transfers                 General transfer comment: declines  Ambulation/Gait             General Gait Details: declines to try   Stairs             Wheelchair Mobility    Modified Rankin (Stroke Patients Only)       Balance Overall balance assessment:  Needs assistance   Sitting balance-Leahy Scale: Good                                      Cognition Arousal/Alertness: Awake/alert Behavior During Therapy: WFL for tasks assessed/performed Overall Cognitive Status: Within Functional Limits for tasks assessed                                 General Comments: pt is not willing to get OOB until he speaks to Dr Sharol Given, not following conversation about crutch training      Exercises General Exercises - Lower Extremity Ankle Circles/Pumps: AAROM;5 reps Quad Sets: AAROM;10 reps Gluteal Sets: Strengthening;10 reps Heel Slides: AAROM;AROM;10 reps Hip ABduction/ADduction: AROM;10 reps Straight Leg Raises: AROM;10 reps    General Comments General comments (skin integrity, edema, etc.): pt is in bed and reluctant to try to move but is prepared for exercises only      Pertinent Vitals/Pain Pain Assessment: Faces Faces Pain Scale: Hurts little more Pain Location: R foot Pain Descriptors / Indicators: Throbbing Pain Intervention(s): Limited activity within patient's tolerance;Repositioned    Home Living                      Prior  Function            PT Goals (current goals can now be found in the care plan section) Acute Rehab PT Goals Patient Stated Goal: to get stronger before going home PT Goal Formulation: With patient Progress towards PT goals: Progressing toward goals    Frequency    Min 3X/week      PT Plan Current plan remains appropriate    Co-evaluation              AM-PAC PT "6 Clicks" Mobility   Outcome Measure  Help needed turning from your back to your side while in a flat bed without using bedrails?: None Help needed moving from lying on your back to sitting on the side of a flat bed without using bedrails?: None Help needed moving to and from a bed to a chair (including a wheelchair)?: A Little Help needed standing up from a chair using your arms (e.g.,  wheelchair or bedside chair)?: A Little Help needed to walk in hospital room?: A Little Help needed climbing 3-5 steps with a railing? : A Lot 6 Click Score: 19    End of Session   Activity Tolerance: Patient limited by pain Patient left: in bed;with call bell/phone within reach Nurse Communication: Mobility status PT Visit Diagnosis: Other abnormalities of gait and mobility (R26.89);Muscle weakness (generalized) (M62.81);Difficulty in walking, not elsewhere classified (R26.2);Pain Pain - Right/Left: Right Pain - part of body: Ankle and joints of foot     Time: 1130-1155 PT Time Calculation (min) (ACUTE ONLY): 25 min  Charges:  $Therapeutic Exercise: 23-37 mins                 Ramond Dial 02/18/2020, 5:39 PM  Mee Hives, PT MS Acute Rehab Dept. Number: [place]

## 2020-02-19 DIAGNOSIS — L03031 Cellulitis of right toe: Secondary | ICD-10-CM | POA: Diagnosis not present

## 2020-02-19 DIAGNOSIS — L02611 Cutaneous abscess of right foot: Secondary | ICD-10-CM | POA: Diagnosis not present

## 2020-02-19 LAB — CBC WITH DIFFERENTIAL/PLATELET
Abs Immature Granulocytes: 0.14 10*3/uL — ABNORMAL HIGH (ref 0.00–0.07)
Basophils Absolute: 0 10*3/uL (ref 0.0–0.1)
Basophils Relative: 0 %
Eosinophils Absolute: 0.1 10*3/uL (ref 0.0–0.5)
Eosinophils Relative: 0 %
HCT: 33.4 % — ABNORMAL LOW (ref 39.0–52.0)
Hemoglobin: 10.7 g/dL — ABNORMAL LOW (ref 13.0–17.0)
Immature Granulocytes: 1 %
Lymphocytes Relative: 60 %
Lymphs Abs: 9.4 10*3/uL — ABNORMAL HIGH (ref 0.7–4.0)
MCH: 27.6 pg (ref 26.0–34.0)
MCHC: 32 g/dL (ref 30.0–36.0)
MCV: 86.1 fL (ref 80.0–100.0)
Monocytes Absolute: 1.5 10*3/uL — ABNORMAL HIGH (ref 0.1–1.0)
Monocytes Relative: 10 %
Neutro Abs: 4.6 10*3/uL (ref 1.7–7.7)
Neutrophils Relative %: 29 %
Platelets: 143 10*3/uL — ABNORMAL LOW (ref 150–400)
RBC: 3.88 MIL/uL — ABNORMAL LOW (ref 4.22–5.81)
RDW: 19.1 % — ABNORMAL HIGH (ref 11.5–15.5)
WBC: 15.8 10*3/uL — ABNORMAL HIGH (ref 4.0–10.5)
nRBC: 0.1 % (ref 0.0–0.2)

## 2020-02-19 LAB — COMPREHENSIVE METABOLIC PANEL
ALT: 16 U/L (ref 0–44)
AST: 30 U/L (ref 15–41)
Albumin: 2.9 g/dL — ABNORMAL LOW (ref 3.5–5.0)
Alkaline Phosphatase: 75 U/L (ref 38–126)
Anion gap: 17 — ABNORMAL HIGH (ref 5–15)
BUN: 60 mg/dL — ABNORMAL HIGH (ref 8–23)
CO2: 22 mmol/L (ref 22–32)
Calcium: 8.8 mg/dL — ABNORMAL LOW (ref 8.9–10.3)
Chloride: 93 mmol/L — ABNORMAL LOW (ref 98–111)
Creatinine, Ser: 7.79 mg/dL — ABNORMAL HIGH (ref 0.61–1.24)
GFR, Estimated: 7 mL/min — ABNORMAL LOW (ref >60)
Glucose, Bld: 156 mg/dL — ABNORMAL HIGH (ref 70–99)
Potassium: 5 mmol/L (ref 3.5–5.1)
Sodium: 132 mmol/L — ABNORMAL LOW (ref 135–145)
Total Bilirubin: 0.9 mg/dL (ref 0.3–1.2)
Total Protein: 5.7 g/dL — ABNORMAL LOW (ref 6.5–8.1)

## 2020-02-19 LAB — PHOSPHORUS: Phosphorus: 5.6 mg/dL — ABNORMAL HIGH (ref 2.5–4.6)

## 2020-02-19 LAB — GLUCOSE, CAPILLARY
Glucose-Capillary: 164 mg/dL — ABNORMAL HIGH (ref 70–99)
Glucose-Capillary: 142 mg/dL — ABNORMAL HIGH (ref 70–99)
Glucose-Capillary: 137 mg/dL — ABNORMAL HIGH (ref 70–99)
Glucose-Capillary: 116 mg/dL — ABNORMAL HIGH (ref 70–99)

## 2020-02-19 LAB — MAGNESIUM: Magnesium: 2.7 mg/dL — ABNORMAL HIGH (ref 1.7–2.4)

## 2020-02-19 MED ORDER — SUCROFERRIC OXYHYDROXIDE 500 MG PO CHEW
500.0000 mg | CHEWABLE_TABLET | Freq: Three times a day (TID) | ORAL | Status: DC
  Administered 2020-02-19 – 2020-02-20 (×4): 500 mg via ORAL
  Filled 2020-02-19 (×6): qty 1

## 2020-02-19 MED ORDER — HEPARIN SODIUM (PORCINE) 1000 UNIT/ML IJ SOLN
INTRAMUSCULAR | Status: AC
  Filled 2020-02-19: qty 4

## 2020-02-19 NOTE — Progress Notes (Signed)
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  He was found to have gangrene of the second, third and fourth right toes.  On presentation he was hemodynamically stable.  X-rays of the foot showed no evidence of osteomyelitis but large linear metallic foreign body in the plantar soft tissue of the foot and on the mid first metatarsal.  Started on broad spectrum antibiotics. Patient was transferred from [facility] to [facility] for  continuation of dialysis.  He underwent  lower extremity arteriogram with  intervention on 02/12/20.  Nephrology following for dialysis.  Patient underwent right metatarsal amputation by Dr. Sharol Given on 02/15/2020.    Assessment & Plan:   Principal Problem:   Cellulitis and abscess of toe of right foot Active Problems:   Chronic lymphocytic leukemia (CLL), B-cell (HCC)   PVD (peripheral vascular disease) (HCC)   CKD (chronic kidney disease)   DM2 (diabetes mellitus, type 2) (HCC)   Cellulitis of right foot   Necrotic toes (HCC)   Gangrene of the right foot toes:  Patient presented with discoloration, swelling of the right foot.   X-ray did not show any evidence of osteomyelitis.  Vascular surgery following.  He underwent  lower extremity angiogram stenting of  right femoral-popliteal artery ,  right tibioperoneal trunk and Balloon angioplasty on 02/12/20.   He completed antibiotics for 7 days. Culture negative so far. Currently he is afebrile, hemodynamically stable.  Has mild leukocytosis. Dr. Sharol Given recommended right transmetatarsal  amputation . 02/17/2020: Patient is status post transmetatarsal amputation of the right foot.   02/18/20: Wound Vac noted. PT SNF awaiting ortho clearance.  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   We will continue his home medications on discharge. 02/17/2020: Patient will follow vascular surgery team on discharge.  ESRD on dialysis:  Dialyzed on TTS schedule.   Next hemodialysis on Thursday.   Diabetes type 2: Recent hemoglobin A1C of 6.1.  Monitor blood sugars.  Continue current regimen  Normocytic anemia: Secondary to ESRD.  Continue to monitor.  Currently hemoglobin stable.  History of CLL: Follows with Dr. Learta Codding.  Outpatient follow-up recommended  Thrombocytopenia: Continue to monitor.  Stable  Chronic hypotension: On midodrine  Hyperlipidemia: On Pravachol   DVT prophylaxis: Heparin sq Code Status: Full code. Family Communication: No family at bed side. Disposition Plan:   Status is: Inpatient  Remains inpatient appropriate because:Inpatient level of care appropriate due to severity of illness   Dispo: The patient is from: Home              Anticipated d/c is to: SNF              Anticipated d/c date is: 2 days              Patient currently is not medically stable to d/c.   Difficult to place patient No    Consultants:   Orthopedics  Procedures: Right metatarsal amputation. Antimicrobials:  Anti-infectives (From admission, onward)   Start  Dose/Rate Route Frequency Ordered Stop   02/16/20 1259  vancomycin (VANCOCIN) 500-5 MG/100ML-% IVPB       Note to Pharmacy: Judieth Keens  : cabinet override      02/16/20 1259 02/16/20 1301   02/15/20 0630  ceFAZolin (ANCEF) IVPB 2g/100 mL premix        2 g 200 mL/hr over 30 Minutes Intravenous 30 min pre-op 02/14/20 1942 02/15/20 0747   02/15/20 0600  ceFAZolin (ANCEF) IVPB 2g/100  mL premix  Status:  Discontinued        2 g 200 mL/hr over 30 Minutes Intravenous On call to O.R. 02/14/20 1944 02/14/20 1948   02/14/20 1200  vancomycin (VANCOREADY) IVPB 500 mg/100 mL        500 mg 100 mL/hr over 60 Minutes Intravenous Every T-Th-Sa (Hemodialysis) 02/13/20 1347 02/16/20 1404   02/13/20 1445  vancomycin (VANCOREADY) IVPB 500 mg/100 mL        500 mg 100 mL/hr over 60 Minutes Intravenous  Once 02/13/20 1346 02/13/20 1718   02/12/20 1200  vancomycin (VANCOCIN) IVPB 1000 mg/200 mL premix  Status:  Discontinued        1,000 mg 200 mL/hr over 60 Minutes Intravenous Every T-Th-Sa (Hemodialysis) 02/10/20 1904 02/13/20 1347   02/11/20 0000  ceFEPIme (MAXIPIME) 1 g in sodium chloride 0.9 % 100 mL IVPB        1 g 200 mL/hr over 30 Minutes Intravenous Daily-1800 02/10/20 1904 02/16/20 2359   02/10/20 1630  vancomycin (VANCOREADY) IVPB 2000 mg/400 mL        2,000 mg 200 mL/hr over 120 Minutes Intravenous STAT 02/10/20 1619 02/10/20 1929   02/10/20 1630  piperacillin-tazobactam (ZOSYN) IVPB 3.375 g        3.375 g 100 mL/hr over 30 Minutes Intravenous STAT 02/10/20 1619 02/10/20 1707     Subjective: Patient was seen and examined at bedside,  Overnight events noted.   Patient reports feeling better, states pain is better controlled. Patient is stating that he needs rehab, not able to go home because he was told not to put weight on his right foot.  Objective: Vitals:   02/19/20 0054 02/19/20 0350 02/19/20 0805 02/19/20 1205  BP: 117/61 115/74 114/64 100/72  Pulse: 84 89 93 86  Resp: 16 13 17 15   Temp: 98.4 F (36.9 C) 99.1 F (37.3 C) 98.1 F (36.7 C) 98.6 F (37 C)  TempSrc: Oral Oral Oral Oral  SpO2: 99% 100% 99% 99%  Weight:  97.3 kg    Height:       No intake or output data in the 24 hours ending 02/19/20 1418 Filed Weights   02/17/20 0514 02/18/20 0414 02/19/20 0350  Weight: 91.1 kg 93.4 kg 97.3 kg    Examination:  General exam: Appears calm and comfortable,  Not in any acute distress. Respiratory system: Clear to auscultation. Respiratory effort normal. Cardiovascular system: S1 & S2 heard, RRR. No JVD, murmurs, rubs, gallops or clicks. No pedal edema. Gastrointestinal system: Abdomen is nondistended, soft and nontender. No organomegaly or masses felt. Normal bowel sounds heard. Central nervous system: Alert and oriented. No focal neurological deficits. Extremities: Symmetric 5 x 5 power. Right front toes amputated, Wound vac noted. Skin: No rashes, lesions or ulcers Psychiatry: Judgement and insight appear normal. Mood & affect appropriate.     Data Reviewed: I have personally reviewed following labs and imaging studies  CBC: Recent Labs  Lab 02/13/20 0249 02/14/20 1633 02/15/20 0256 02/16/20 1210 02/18/20 0130 02/19/20 0246  WBC 13.7* 14.3* 15.7* 14.0* 14.4* 15.8*  NEUTROABS 4.0  --  3.5  --  4.2 4.6  HGB 10.9* 11.0* 11.3* 10.7* 10.0* 10.7*  HCT 36.4* 36.9* 37.7* 34.8* 33.5* 33.4*  MCV 88.6 88.7 88.5 89.0 88.2 86.1  PLT 124* 128* 117* 123* 122* 154*   Basic Metabolic Panel: Recent Labs  Lab 02/12/20 2243 02/13/20 0249 02/14/20 1633 02/15/20 0256 02/16/20 1210 02/18/20 0130 02/19/20 0246  NA 133*   < > 133* 135 133* 134* 132*  K 4.0   < > 3.8 4.2 4.2 4.4 5.0  CL 97*   < > 94* 96* 96* 96* 93*  CO2 21*   < > 24 24 23 25 22   GLUCOSE 87   < > 143* 118* 152* 154* 156*  BUN 59*   < > 47* 29* 45* 47* 60*  CREATININE 6.69*   < > 6.46* 4.98* 6.57* 6.69* 7.79*  CALCIUM 8.7*   < > 8.9 9.1 8.8* 8.7* 8.8*  MG  --   --   --   --   --   --  2.7*  PHOS 5.9*  --  5.2*  --  5.8* 5.7* 5.6*   < > = values in this interval not displayed.   GFR: Estimated Creatinine Clearance: 10.5 mL/min (A) (by C-G formula based on SCr of 7.79 mg/dL (H)). Liver Function Tests: Recent Labs  Lab 02/12/20 2243 02/14/20 1633 02/16/20 1210 02/18/20 0130 02/19/20 0246  AST  --   --   --   --  30  ALT  --   --   --   --  16  ALKPHOS  --   --   --    --  75  BILITOT  --   --   --   --  0.9  PROT  --   --   --   --  5.7*  ALBUMIN 2.8* 2.8* 2.8* 2.5* 2.9*   No results for input(s): LIPASE, AMYLASE in the last 168 hours. No results for input(s): AMMONIA in the last 168 hours. Coagulation Profile: No results for input(s): INR, PROTIME in the last 168 hours. Cardiac Enzymes: No results for input(s): CKTOTAL, CKMB, CKMBINDEX, TROPONINI in the last 168 hours. BNP (last 3 results) No results for input(s): PROBNP in the last 8760 hours. HbA1C: No results for input(s): HGBA1C in the last 72 hours. CBG: Recent Labs  Lab 02/18/20 1651 02/18/20 2038 02/19/20 0354 02/19/20 0810 02/19/20 1208  GLUCAP 138* 136* 164* 142* 137*   Lipid Profile: No results for input(s): CHOL, HDL, LDLCALC, TRIG, CHOLHDL, LDLDIRECT in the last 72 hours. Thyroid Function Tests: No results for input(s): TSH, T4TOTAL, FREET4, T3FREE, THYROIDAB in the last 72 hours. Anemia Panel: No results for input(s): VITAMINB12, FOLATE, FERRITIN, TIBC, IRON, RETICCTPCT in the last 72 hours. Sepsis Labs: No results for input(s): PROCALCITON, LATICACIDVEN in the last 168 hours.  Recent Results (from the past 240 hour(s))  SARS Coronavirus 2 by RT PCR (hospital order, performed in [facility] hospital lab)     Status: None   Collection Time: 02/10/20  7:00 PM  Result Value Ref Range Status   SARS Coronavirus 2 NEGATIVE NEGATIVE Final    Comment: (NOTE) SARS-CoV-2 target nucleic acids are NOT DETECTED.  The SARS-CoV-2 RNA is generally detectable in upper and lower respiratory specimens during the acute phase of infection. The lowest concentration of SARS-CoV-2 viral copies this assay can detect is 250 copies / mL. A negative result does  not preclude SARS-CoV-2 infection and should not be used as the sole basis for treatment or other patient management decisions.  A negative result may occur with improper specimen collection / handling, submission of specimen other than  nasopharyngeal swab, presence of viral mutation(s) within the areas targeted by this assay, and inadequate number of viral copies (<250 copies / mL). A negative result must be combined with clinical observations, patient history, and epidemiological information.  Fact Sheet for Patients:   StrictlyIdeas.no  Fact Sheet for Healthcare Providers: BankingDealers.co.za  This test is not yet approved or  cleared by the [place] FDA and has been authorized for detection and/or diagnosis of SARS-CoV-2 by FDA under an Emergency Use Authorization (EUA).  This EUA will remain in effect (meaning this test can be used) for the duration of the COVID-19 declaration under Section 564(b)(1) of the Act, 21 U.S.C. section 360bbb-3(b)(1), unless the authorization is terminated or revoked sooner.  Performed at [facility], [place] [address], [place], [place] [address]   MRSA PCR Screening     Status: None   Collection Time: 02/11/20 11:07 PM   Specimen: Nasopharyngeal  Result Value Ref Range Status   MRSA by PCR NEGATIVE NEGATIVE Final    Comment:        The GeneXpert MRSA Assay (FDA approved for NASAL specimens only), is one component of a comprehensive MRSA colonization surveillance program. It is not intended to diagnose MRSA infection nor to guide or monitor treatment for MRSA infections. Performed at [facility], [place] [address] [place], [place] [address]   Culture, blood (routine x 2)     Status: None   Collection Time: 02/12/20 11:08 PM   Specimen: BLOOD  Result Value Ref Range Status   Specimen Description BLOOD LEFT UPPER HAND  Final   Special Requests   Final    BOTTLES DRAWN AEROBIC ONLY Blood Culture adequate volume   Culture   Final    NO GROWTH 5 DAYS Performed at [facility], [place] [address] [place], [place] [address]    Report Status 02/18/2020 FINAL  Final  Culture, blood (routine x 2)      Status: None   Collection Time: 02/12/20 11:08 PM   Specimen: BLOOD  Result Value Ref Range Status   Specimen Description BLOOD LEFT LOWER HAND  Final   Special Requests   Final    BOTTLES DRAWN AEROBIC ONLY Blood Culture adequate volume   Culture   Final    NO GROWTH 5 DAYS Performed at [facility], [place] [address], [place], [place] [address]    Report Status 02/18/2020 FINAL  Final  Surgical pcr screen     Status: None   Collection Time: 02/14/20  9:08 PM   Specimen: Nasal Mucosa; Nasal Swab  Result Value Ref Range Status   MRSA, PCR NEGATIVE NEGATIVE Final   Staphylococcus aureus NEGATIVE NEGATIVE Final    Comment: (NOTE) The Xpert SA Assay (FDA approved for NASAL specimens in patients 63 years of age and older), is one component of a comprehensive surveillance program. It is not intended to diagnose infection nor to guide or monitor treatment. Performed at [facility], [place] [address] [place], [place] [address]     Radiology Studies: No results found.  Scheduled Meds: . aspirin EC  81 mg Oral Daily  . calcitRIOL  0.25 mcg Oral Q M,W,F  . Chlorhexidine Gluconate Cloth  6 each Topical Q0600  .  Chlorhexidine Gluconate Cloth  6 each Topical Q0600  . clopidogrel  75 mg Oral Daily  . cyanocobalamin  1,000 mcg Intramuscular Q30 days  . docusate sodium  100 mg Oral BID  . folic acid  1 mg Oral Daily  . heparin  5,000 Units Subcutaneous Q8H  . insulin aspart  0-15 Units Subcutaneous TID WC  . insulin aspart  0-5 Units Subcutaneous QHS  . midodrine  10 mg Oral Q T,Th,Sa-HD  . multivitamin  1 tablet Oral QHS  . pravastatin  40 mg Oral QHS  . sucroferric oxyhydroxide  500 mg Oral TID WC   Continuous Infusions: . sodium chloride 75 mL/hr at 02/15/20 1210     LOS: 9 days    Time spent: 25 mins    Cobi Delph, MD [facility]   If 7PM-7AM, please contact night-coverage

## 2020-02-19 NOTE — Progress Notes (Signed)
PT Cancellation Note  Patient Details Name: Thomas Mcguire MRN: [id number] DOB: 03-26-1940   Cancelled Treatment:    Reason Eval/Treat Not Completed: Other (comment). Initiated treatment but quickly interrupted by lunch which pt wanted to eat prior to going to HD. Will continue to attempt. Agree pt will have difficulty at home while maintaining NWB and updated recommendations to ST-SNF.    [place] 02/19/2020, 11:51 AM [place] Pager [phone] Office [phone]

## 2020-02-19 NOTE — Progress Notes (Signed)
Subjective: Dialysis today no complaints, admits he will need rehab  Objective Vital signs in last 24 hours: Vitals:   02/18/20 2040 02/19/20 0054 02/19/20 0350 02/19/20 0805  BP: (!) 109/59 117/61 115/74 114/64  Pulse: 79 84 89 93  Resp: 17 16 13 17   Temp: 97.7 F (36.5 C) 98.4 F (36.9 C) 99.1 F (37.3 C) 98.1 F (36.7 C)  TempSrc: Oral Oral Oral Oral  SpO2: 98% 99% 100% 99%  Weight:   97.3 kg   Height:       Weight change: 3.859 kg  Physical Exam General: Alert, elderly male, NAD Heart:RRR, no mrg Lungs: CTAB, breath sounds decreased at bases, nml WOB on RA Abdomen:soft, NTND Extremities:no LE edema, right transmit wound VAC placed Dialysis Access:TDC, RU AVF +b/t    Dialysis Orders: Center:[facility] TTS. 180NRe, 4 hours, BFR 400, DFR 800, EDW 85kg, 2K/2.5Ca, TDC, no heparin No ESA Calcitriol 0.75 mcg PO q HD   Problem/Plan: 1. Gangrene of R foot/PAD: VVS following,s/parteriogram on 2/2/22and TMA2/4/22by Dr. Sharol Given.  Wound VAC onantibiotics per primary team.VVS recommend plavix and ASA on d/c with f/u of fem pop bypass with ABI in 1 month. TMA per Dr. Sharol Given.  2. ESRD:TTS schedule. HD is shortened due to high census/nursing shortage.Next HD 02/19/20.   3. Hypotension/volume:BP controlled.Continue midodrine prior to HD.No edema on exam.UF as tolerated. 4. Anemia:Hgb 10.7 follow-up trend no ESA indicated at this time. Start ESA if lower than 10.5  5. Metabolic bone disease:Calcium controlled.Phos5.6, start phosphate binder Auryxia yesterday but hard to swallow will use chewable VELPHORO 6. T2DM:Mgt per primary team 7. Thrombocytopenia: Stable, 143, no heparin with HD  Ernest Haber, PA-C [facility] [place] [phone] 02/19/2020,10:55 AM  LOS: 9 days   Labs: Basic Metabolic Panel: Recent Labs  Lab 02/16/20 1210 02/18/20 0130 02/19/20 0246  NA 133* 134* 132*  K 4.2 4.4 5.0  CL 96* 96* 93*  CO2 23 25 22    GLUCOSE 152* 154* 156*  BUN 45* 47* 60*  CREATININE 6.57* 6.69* 7.79*  CALCIUM 8.8* 8.7* 8.8*  PHOS 5.8* 5.7* 5.6*   Liver Function Tests: Recent Labs  Lab 02/16/20 1210 02/18/20 0130 02/19/20 0246  AST  --   --  30  ALT  --   --  16  ALKPHOS  --   --  75  BILITOT  --   --  0.9  PROT  --   --  5.7*  ALBUMIN 2.8* 2.5* 2.9*   No results for input(s): LIPASE, AMYLASE in the last 168 hours. No results for input(s): AMMONIA in the last 168 hours. CBC: Recent Labs  Lab 02/14/20 1633 02/15/20 0256 02/16/20 1210 02/18/20 0130 02/19/20 0246  WBC 14.3* 15.7* 14.0* 14.4* 15.8*  NEUTROABS  --  3.5  --  4.2 4.6  HGB 11.0* 11.3* 10.7* 10.0* 10.7*  HCT 36.9* 37.7* 34.8* 33.5* 33.4*  MCV 88.7 88.5 89.0 88.2 86.1  PLT 128* 117* 123* 122* 143*   Cardiac Enzymes: No results for input(s): CKTOTAL, CKMB, CKMBINDEX, TROPONINI in the last 168 hours. CBG: Recent Labs  Lab 02/18/20 1119 02/18/20 1651 02/18/20 2038 02/19/20 0354 02/19/20 0810  GLUCAP 174* 138* 136* 164* 142*    Studies/Results: No results found. Medications: . sodium chloride 75 mL/hr at 02/15/20 1210   . aspirin EC  81 mg Oral Daily  . calcitRIOL  0.25 mcg Oral Q M,W,F  . Chlorhexidine Gluconate Cloth  6 each Topical Q0600  . Chlorhexidine Gluconate Cloth  6 each  Topical Q0600  . clopidogrel  75 mg Oral Daily  . cyanocobalamin  1,000 mcg Intramuscular Q30 days  . docusate sodium  100 mg Oral BID  . folic acid  1 mg Oral Daily  . heparin  5,000 Units Subcutaneous Q8H  . insulin aspart  0-15 Units Subcutaneous TID WC  . insulin aspart  0-5 Units Subcutaneous QHS  . midodrine  10 mg Oral Q T,Th,Sa-HD  . multivitamin  1 tablet Oral QHS  . pravastatin  40 mg Oral QHS  . sucroferric oxyhydroxide  500 mg Oral TID WC

## 2020-02-19 NOTE — Care Management Important Message (Signed)
Important Message  Patient Details  Name: NOUR SCALISE MRN: [id number] Date of Birth: 02-16-1940   Medicare Important Message Given:  Yes     Shelda Altes 02/19/2020, 11:15 AM

## 2020-02-19 NOTE — TOC Progression Note (Signed)
Transition of Care [facility]) - Progression Note    Patient Details  Name: Thomas Mcguire MRN: [id number] Date of Birth: 1940/04/30  Transition of Care [facility]) CM/SW [place], [place] Phone Number: 02/19/2020, 4:16 PM  Clinical Narrative:     Update 4:17pm- CSW spoke with patient at bedside. CSW let patient know that he has a SNF bed offer with [facility]. Patient wants CSW to follow up with him in the morning with SNF choice. Patient wants to see if he gets any SNF offers in [place] area before making final decision. CSW updated patients insurance.  SNF choice pending. Insurance authorization pending.  CSW will continue to follow.  CSW spoke with patient and let him know there are no current SNF bed offers. Patient gave CSW permission to fax out further  to [place] area. Patient has HD Tuesday,thursday, and Saturday at [facility].     Expected Discharge Plan: [place] Barriers to Discharge: Continued Medical Work up  Expected Discharge Plan and Services Expected Discharge Plan: [place] arrangements for the past 2 months: Single Family Home                                       Social Determinants of Health (SDOH) Interventions    Readmission Risk Interventions Readmission Risk Prevention Plan 07/14/2018  Transportation Screening Complete  PCP or Specialist Appt within 5-7 Days Complete  Home Care Screening Complete  Medication Review (RN CM) Complete  Some recent data might be hidden

## 2020-02-19 NOTE — Progress Notes (Signed)
Patient is status post transmetatarsal amputation.  Right foot.  He is doing well.  He is having difficulty mobilizing and being safe but has no complaints of pain  Vital signs stable afebrile Prevena wound VAC is functioning with some 50 cc of drainage.  Dressing is clean dry and intact.  Status post above plan for discharge to skilled nursing until he can be safe to maintain nonweightbearing status.  Will need follow-up in our office in 1 week

## 2020-02-19 NOTE — Progress Notes (Signed)
PT Cancellation Note  Patient Details Name: Thomas Mcguire MRN: [id number] DOB: 10/18/1940   Cancelled Treatment:    Reason Eval/Treat Not Completed: Other (comment). I attempted a couple of times after lunch. On first attempt after lunch pt reported HD had just called and was on the way. Currently pt is still in room and HD hasn't come. Pt now reporting he is too fatigued to do therapy. Will continue attempts at later date.    Shary Decamp [facility] 02/19/2020, 2:53 PM Thomas Mcguire Pager [phone] Office [phone]

## 2020-02-20 ENCOUNTER — Ambulatory Visit: Payer: Medicare Other | Admitting: Internal Medicine

## 2020-02-20 LAB — RESP PANEL BY RT-PCR (FLU A&B, COVID) ARPGX2
Influenza A by PCR: NEGATIVE
Influenza B by PCR: NEGATIVE
SARS Coronavirus 2 by RT PCR: NEGATIVE

## 2020-02-20 LAB — RENAL FUNCTION PANEL
Albumin: 2.6 g/dL — ABNORMAL LOW (ref 3.5–5.0)
Anion gap: 13 (ref 5–15)
BUN: 43 mg/dL — ABNORMAL HIGH (ref 8–23)
CO2: 25 mmol/L (ref 22–32)
Calcium: 8.1 mg/dL — ABNORMAL LOW (ref 8.9–10.3)
Chloride: 96 mmol/L — ABNORMAL LOW (ref 98–111)
Creatinine, Ser: 6.05 mg/dL — ABNORMAL HIGH (ref 0.61–1.24)
GFR, Estimated: 9 mL/min — ABNORMAL LOW (ref >60)
Glucose, Bld: 141 mg/dL — ABNORMAL HIGH (ref 70–99)
Phosphorus: 3.6 mg/dL (ref 2.5–4.6)
Potassium: 4.4 mmol/L (ref 3.5–5.1)
Sodium: 134 mmol/L — ABNORMAL LOW (ref 135–145)

## 2020-02-20 LAB — CBC WITH DIFFERENTIAL/PLATELET
Abs Immature Granulocytes: 0.11 10*3/uL — ABNORMAL HIGH (ref 0.00–0.07)
Basophils Absolute: 0 10*3/uL (ref 0.0–0.1)
Basophils Relative: 0 %
Eosinophils Absolute: 0 10*3/uL (ref 0.0–0.5)
Eosinophils Relative: 0 %
HCT: 30.1 % — ABNORMAL LOW (ref 39.0–52.0)
Hemoglobin: 9.5 g/dL — ABNORMAL LOW (ref 13.0–17.0)
Immature Granulocytes: 1 %
Lymphocytes Relative: 57 %
Lymphs Abs: 7.3 10*3/uL — ABNORMAL HIGH (ref 0.7–4.0)
MCH: 27.4 pg (ref 26.0–34.0)
MCHC: 31.6 g/dL (ref 30.0–36.0)
MCV: 86.7 fL (ref 80.0–100.0)
Monocytes Absolute: 1.3 10*3/uL — ABNORMAL HIGH (ref 0.1–1.0)
Monocytes Relative: 11 %
Neutro Abs: 4 10*3/uL (ref 1.7–7.7)
Neutrophils Relative %: 31 %
Platelets: 144 10*3/uL — ABNORMAL LOW (ref 150–400)
RBC: 3.47 MIL/uL — ABNORMAL LOW (ref 4.22–5.81)
RDW: 19.2 % — ABNORMAL HIGH (ref 11.5–15.5)
WBC Morphology: ABNORMAL
WBC: 12.8 10*3/uL — ABNORMAL HIGH (ref 4.0–10.5)
nRBC: 0 % (ref 0.0–0.2)

## 2020-02-20 LAB — GLUCOSE, CAPILLARY
Glucose-Capillary: 117 mg/dL — ABNORMAL HIGH (ref 70–99)
Glucose-Capillary: 168 mg/dL — ABNORMAL HIGH (ref 70–99)
Glucose-Capillary: 106 mg/dL — ABNORMAL HIGH (ref 70–99)

## 2020-02-20 LAB — MAGNESIUM: Magnesium: 2.6 mg/dL — ABNORMAL HIGH (ref 1.7–2.4)

## 2020-02-20 MED ORDER — CLOPIDOGREL BISULFATE 75 MG PO TABS: 75.0000 mg | ORAL_TABLET | Freq: Every day | ORAL | Status: DC

## 2020-02-20 MED ORDER — OXYCODONE HCL 5 MG PO TABS
5.0000 mg | ORAL_TABLET | ORAL | 0 refills | Status: AC | PRN
Start: 2020-02-20 — End: ?

## 2020-02-20 MED ORDER — ASPIRIN 81 MG PO TBEC: 81.0000 mg | DELAYED_RELEASE_TABLET | Freq: Every day | ORAL | 11 refills | Status: AC

## 2020-02-20 MED ORDER — SUCROFERRIC OXYHYDROXIDE 500 MG PO CHEW: 500.0000 mg | CHEWABLE_TABLET | Freq: Three times a day (TID) | ORAL | Status: AC

## 2020-02-20 NOTE — TOC Transition Note (Signed)
Transition of Care [facility]) - CM/SW Discharge Note   Patient Details  Name: Thomas Mcguire MRN: [id number] Date of Birth: [birth year]  Transition of Care [facility]) CM/SW Contact:  Trula Ore, [place] Phone Number: 02/20/2020, 1:44 PM   Clinical Narrative:     Patient will DC to: [place] date: 02/20/2020  Family notified: Vira Agar   Transport by: Corey Harold  ?  Per MD patient ready for DC to [facility] . RN, patient, patient's family, renal navigator, and facility notified of DC. Discharge Summary sent to facility. RN given number for report [phone]. DC packet on chart. Ambulance transport requested for patient.  CSW signing off.    Final next level of care: Skilled Nursing Facility Barriers to Discharge: No Barriers Identified   Patient Goals and CMS Choice Patient states their goals for this hospitalization and ongoing recovery are:: to go to SNF CMS Medicare.gov Compare Post Acute Care list provided to:: Patient Choice offered to / list presented to : Patient  Discharge Placement              Patient chooses bed at: [facility] Patient to be transferred to facility by: [place] Name of family member notified: Vira Agar Patient and family notified of of transfer: 02/20/20  Discharge Plan and Services                                     Social Determinants of Health ([place]) Interventions     Readmission Risk Interventions Readmission Risk Prevention Plan 07/14/2018  Transportation Screening Complete  PCP or Specialist Appt within 5-7 Days Complete  Home Care Screening Complete  Medication Review (RN CM) Complete  Some recent data might be hidden

## 2020-02-20 NOTE — TOC Progression Note (Addendum)
Transition of Care [facility]) - Progression Note    Patient Details  Name: DONOLD MAROTTO MRN: [id number] Date of Birth: 25-Feb-1940  Transition of Care [facility]) CM/SW [place], [place] Phone Number: 02/20/2020, 11:37 AM  Clinical Narrative:      CSW spoke with patient and patients spouse and they have accepted SNF bed offer with [facility]. CSW called patients insurance and gave SNF choice. Insurance authorization has been approved. [place] JQ#[id number]. Insurance has been approved from 2/9-2/11. Next review is for 2/11.  Patient has a SNF bed at [facility]. Insurance authorization has been approved.  CSW will continue to follow.  CSW spoke with patient and patients spouse at bedside. CSW let patient know that currently only bed offer is with [facility]. Patient requested to talk over decision with spouse. CSW will follow up with patient this afternoon with SNF choice.  Pending SNF choice. CSW needs to call patients insurance to update facility choice once SNF choice is made.  CSW will continue to follow.    Expected Discharge Plan: [place] Barriers to Discharge: Continued Medical Work up  Expected Discharge Plan and Services Expected Discharge Plan: [place] arrangements for the past 2 months: Single Family Home                                       Social Determinants of Health (SDOH) Interventions    Readmission Risk Interventions Readmission Risk Prevention Plan 07/14/2018  Transportation Screening Complete  PCP or Specialist Appt within 5-7 Days Complete  Home Care Screening Complete  Medication Review (RN CM) Complete  Some recent data might be hidden

## 2020-02-20 NOTE — Discharge Instructions (Signed)
Gangrene Gangrene is a medical condition that is caused by a lack of blood supply to an area of your body. Oxygen travels through blood, so less blood means less oxygen. Without oxygen, body tissue will start to die. Gangrene can affect many parts of the body. It is most common on the skin (external gangrene) and on your arms and legs, but it can also affect internal body parts (internal gangrene). Gangrene requires emergency medical treatment. What are the causes? Any condition that cuts off blood supply can cause gangrene. Common causes include:  Injuries.  Blood vessel diseases.  Diabetes.  Infections. What increases the risk? You may be at increased risk for gangrene if you have recently had surgery, or if you have:  A serious injury.  Diabetes.  A weak disease-fighting system (immune system).  Narrowing of your arteries due to plaque buildup (arteriosclerosis).  An immune system disease that causes your arteries to tighten (Raynaud's disease).  A history of IV drug use. What are the signs or symptoms? Symptoms depend on which part of your body is affected. If you have external gangrene, you may have:  Severe pain, followed by loss of feeling.  Redness and swelling.  Crackling sounds when you press on a swollen area of skin.  A wound with bad-smelling drainage.  Changes in the color of your skin. It may turn red, blue, black, or white.  Fever and chills. If you have internal gangrene, you may have:  Fever and chills.  Confusion.  Dizziness.  Severe pain.  Rapid heartbeat and breathing.  Loss of appetite.  Nausea or vomiting.   How is this diagnosed? This condition may be diagnosed based on:  Your symptoms and medical history.  A physical exam.  X-rays of your blood vessels after injecting dye into your blood vessels (arteriogram).  Blood tests to check for infection.  Imaging tests such as a CT scan or MRI.  Testing a sample of wound drainage  for bacteria (culture).  Testing a tissue sample for cell death (biopsy).  Surgery to look for gangrene inside your body. How is this treated? Treatment for gangrene depends on the cause and the area of your body that is affected. Gangrene is usually treated in the hospital. It is very important to start treatment early, before gangrene spreads. Treatment may include:  High doses of IV antibiotic medicine, for gangrene caused by infection.  Surgery. Surgical options may include: ? Debridement. This is surgery to remove dead tissue. You may need to have debridement several times. ? Bypass or angioplasty. These surgeries improve blood flow to an affected area. ? Amputation. This is surgery to remove a body part. This may be necessary in very severe cases.  Oxygen therapy. This involves treatment in a chamber designed to provide high levels of oxygen (hyperbaric oxygen therapy). It can improve the oxygen supply to an affected area.  Supportive care to keep the rest of your body healthy. This may include: ? IV fluids and nutrients. ? Pain medicines. ? Blood thinners to prevent blood clots. Follow these instructions at home: Skin and wound care  Clean any cuts or scratches with a germ-killing (antiseptic) solution. Your health care provider may recommend certain over-the-counter antiseptics.  Cover any open wounds with a clean bandage. Change the bandage as often as needed to keep the wound clean. Make sure to wash your hands before touching your bandage.  Check wounds every day for signs of infection, such as: ? Redness, swelling, or pain. ?  Fluid or blood. ? Warmth. ? Pus or a bad smell.  If you have diabetes or another blood vessel disease, check your feet every day for signs of injury or infection. You may be at higher risk for gangrene. Lifestyle  Thomas Mcguire not use any products that contain nicotine or tobacco, such as cigarettes and e-cigarettes. These can delay wound healing. If you  need help quitting, ask your health care provider.  Limit alcohol intake to no more than 1 drink a day for women (no drinks if you are pregnant) and 2 drinks a day for men. One drink equals 12 oz of beer, 5 oz of wine, or 1 oz of hard liquor.  Eat a healthy diet and maintain a healthy weight.  Get some exercise on most days of the week. Ask your health care provider what forms of exercise may be best for you. General instructions  Keep all follow-up visits as told by your health care provider. This is important. Medicines  Take over-the-counter and prescription medicines only as told by your health care provider.  If you were prescribed an antibiotic medicine, take it as told by your health care provider. Thomas Mcguire not stop taking the antibiotic even if you start to feel better.  If you are taking blood thinners: ? Talk with your health care provider before you take any medicines that contain aspirin or NSAIDs. These medicines increase your risk for dangerous bleeding. ? Take your medicine exactly as told, at the same time every day. ? Avoid activities that could cause injury or bruising, and follow instructions about how to prevent falls. ? Wear a medical alert bracelet or carry a card that lists what medicines you take. Contact a health care provider if:  You have a wound or sore that is not healing. Get help right away if:  You have a wound or sore that shows signs of infection.  An area of your skin turns white, red, blue, or black.  You have rapidly worsening pain at the site of a skin infection or wound.  You experience numbness at the site of a skin infection or wound.  You have unexplained: ? Fever. ? Chills. ? Confusion. ? Fainting. Summary  Gangrene is a medical condition that is caused by a lack of blood supply to an area of your body.  It is most common on the skin (external gangrene), but it can also affect internal body parts (internal gangrene).  Injuries and  blood vessel diseases are common causes of gangrene.  Gangrene requires emergency medical treatment. Treatment may include hospitalization, antibiotics, or surgery. This information is not intended to replace advice given to you by your health care provider. Make sure you discuss any questions you have with your health care provider. Document Revised: 10/25/2019 Document Reviewed: 10/25/2019 Elsevier Patient Education  2021 [employer].

## 2020-02-20 NOTE — Progress Notes (Signed)
Pt .was discharge in fair condition via [facility] ;Valuables went with pt.

## 2020-02-20 NOTE — Progress Notes (Signed)
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  He had arteriorgram with intervention on 02/12/20 and underwent transmet amputation 02/15/20.    PT Comments    Patient refused OOB mobility due to "doctor told me to keep it elevated and not let it dangle today." Educated patient on importance of mobility and MD recommends elevating R LE when not working with therapy. Agreeable to bed level exercises. Patient continues to present with generalized weakness, decreased activity tolerance, and impaired functional mobility. Continue to recommend SNF for ongoing Physical Therapy.      Follow Up Recommendations  SNF     Equipment Recommendations  None recommended by PT    Recommendations for Other Services       Precautions / Restrictions Precautions Precautions: Fall Precaution Comments: wound vac Required Braces or Orthoses: Other Brace Other Brace: post op shoe Restrictions Weight Bearing Restrictions: Yes RLE Weight Bearing: Non weight bearing Other Position/Activity Restrictions: per PA if puts a little weight on it for transfers in post op shoe, it's okay    Mobility  Bed Mobility               General bed mobility comments: declines OOB mobility  Transfers                    Ambulation/Gait                 Stairs             Wheelchair Mobility    Modified Rankin (Stroke Patients Only)       Balance                                            Cognition Arousal/Alertness:  Awake/alert Behavior During Therapy: WFL for tasks assessed/performed Overall Cognitive Status: Within Functional Limits for tasks assessed                                 General Comments: Patient is not willing to get OOB this session. Patient states "doctor told me to keep it elevated and not let it dangle at all today"      Exercises General Exercises - Lower Extremity Ankle Circles/Pumps: AROM;Both;10 reps Quad Sets: AROM;Both;10 reps Heel Slides: AROM;Both;10 reps Hip ABduction/ADduction: AROM;Both;10 reps Straight Leg Raises: AROM;Both;10 reps    General Comments        Pertinent Vitals/Pain Pain Assessment: Faces Faces Pain Scale: Hurts little more Pain Location: R foot Pain Descriptors / Indicators: Throbbing Pain Intervention(s): Monitored during session    Home Living                      Prior Function            PT Goals (current goals can now be found in the care plan section) Acute Rehab PT Goals Patient Stated Goal: to get stronger before going home PT Goal  Formulation: With patient Time For Goal Achievement: 02/29/20 Potential to Achieve Goals: Good Progress towards PT goals: Not progressing toward goals - comment (refuses OOB mobility the past 2 sessions)    Frequency    Min 3X/week      PT Plan Current plan remains appropriate    Co-evaluation              AM-PAC PT "6 Clicks" Mobility   Outcome Measure  Help needed turning from your back to your side while in a flat bed without using bedrails?: None Help needed moving from lying on your back to sitting on the side of a flat bed without using bedrails?: None Help needed moving to and from a bed to a chair (including a wheelchair)?: A Little Help needed standing up from a chair using your arms (e.g., wheelchair or bedside chair)?: A Little Help needed to walk in hospital room?: A Little Help needed climbing 3-5 steps with a railing? : A Lot 6 Click Score:  19    End of Session   Activity Tolerance: Patient tolerated treatment well Patient left: in bed;with call bell/phone within reach Nurse Communication: Mobility status PT Visit Diagnosis: Other abnormalities of gait and mobility (R26.89);Muscle weakness (generalized) (M62.81);Difficulty in walking, not elsewhere classified (R26.2);Pain Pain - Right/Left: Right Pain - part of body: Ankle and joints of foot     Time: 1002-1015 PT Time Calculation (min) (ACUTE ONLY): 13 min  Charges:  $Therapeutic Exercise: 8-22 mins                     Akaysha Cobern A. Gilford Rile PT, DPT Acute Rehabilitation Services Pager [phone] Office [phone]    Alda Lea 02/20/2020, 12:41 PM

## 2020-02-20 NOTE — Discharge Summary (Signed)
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 He was found to have gangrene of the second, third and fourth right toes. On presentation he was hemodynamically stable. X-rays of the foot showed no evidence of osteomyelitis but large linear metallic foreign body in the plantar soft tissue of the foot and on the mid first metatarsal. Started on broad spectrum antibiotics. Patient was transferred from [facility] to [facility] for continuation of dialysis. He underwent lower extremity arteriogram with intervention on 02/12/20. Nephrology following for dialysis. Patient underwent right metatarsal amputation by Dr. Sharol Given on 02/15/2020. PT/OT recommended skilled nursing facility on discharge.  He is medically stable for discharge to skilled nursing facility as soon as bed is available.  He will follow-up with orthopedics in a week and vascular surgery as an outpatient.  Following problems were addressed during his hospitalization:   Gangrene of the right foot toes: Patient presented with discoloration, swelling of the right foot.  X-ray did not show any evidence of osteomyelitis.  Vascular surgery following. He underwent lower extremity angiogram stenting of right femoral-popliteal artery , right tibioperoneal trunk and Balloon angioplasty on 02/12/20.  He completed  antibiotics for 7 days. Culture negative so far. Currently he is afebrile, hemodynamically stable.  Patient is status post transmetatarsal amputation of the right foot. On wound vac.  He should follow up with orthopedics after 1 week from discharge.  Peripheral vascular disease: Status post femoropopliteal bypass surgery in 2020, chronic ischemic ulcers of the foot  He underwent lower extremity angiogram stenting of right femoral-popliteal artery , right tibioperoneal trunk and Balloon angioplasty on 02/12/20.  Patient will follow vascular surgery team on discharge. Plan for  right leg arterial duplex and ABI's to evaluate stented bypass as an outpatient.  ESRD on dialysis: Dialyzed on TTS schedule. Nephrology following here.  Diabetes type 2:Recent hemoglobin A1C of 6.1.  Continue current regimen  Normocytic anemia: Secondary to ESRD. Continue to monitor. Currently hemoglobin stable.Started on iron supplements  History of CLL: Follows with Dr. Learta Codding. Outpatient follow-up recommended  Thrombocytopenia:Continue to monitor. Stable  Chronic hypotension:On midodrine  Hyperlipidemia:On Pravachol   Discharge Diagnoses:  Principal Problem:   Cellulitis and abscess of toe of right foot Active Problems:   Chronic lymphocytic leukemia (CLL), B-cell (HCC)   PVD (peripheral vascular disease) (HCC)   CKD (chronic kidney disease)   DM2 (diabetes mellitus, type 2) (HCC)   Cellulitis of right foot   Necrotic toes (HCC)     Discharge Instructions  Discharge Instructions    Diet - low sodium heart healthy   Complete by: As directed    Discharge instructions   Complete by: As directed    1)Please follow-up with orthopedics in a week.  Name and number the provider has been attached. 2)Do  a CBC and BMP test in a week 3)Follow up with vascular surgery as an outpatient 4)Take prescribed medications as instucted   Discharge wound care:   Complete by: As  directed  Follow up with orthopedics in a week   Increase activity slowly   Complete by: As directed    Negative Pressure Wound Therapy - Incisional   Complete by: As directed    Show patient how to attach prevena vac. Call office if alarms     Allergies as of 02/20/2020   No Known Allergies     Medication List    TAKE these medications   aspirin 81 MG EC tablet Take 1 tablet (81 mg total) by mouth daily. Swallow whole. Start taking on: February 21, 2020   calcitRIOL 0.25 MCG capsule Commonly known as: ROCALTROL Take 0.25 mcg by mouth every Monday, Wednesday, and Friday.   clopidogrel 75 MG tablet Commonly known as: PLAVIX Take 1 tablet (75 mg total) by mouth daily. Start taking on: February 21, 2020   cyanocobalamin 1000 MCG/ML injection Commonly known as: (VITAMIN B-12) Vitamin B12 injection: 1000 mcg (1 mL) by subcu injection once daily for 7 days; then once weekly for 4 weeks; then once monthly thereafter.  7 07/15/2010 What changed:   how much to take  how to take this  when to take this  additional instructions   folic acid 1 MG tablet Commonly known as: FOLVITE TAKE 1 TABLET BY MOUTH EVERY DAY   insulin NPH Human 100 UNIT/ML injection Commonly known as: NOVOLIN N Inject 0.5 mLs (50 Units total) into the skin daily before breakfast. What changed:   when to take this  reasons to take this   melatonin 1 MG Tabs tablet Take 3 mg by mouth at bedtime as needed (sleep).   midodrine 10 MG tablet Commonly known as: PROAMATINE Take 10 mg by mouth 3 (three) times a week. Tues, Thurs, Sat   NEPHRO-VITE RX PO Take 1 tablet by mouth at bedtime.   oxyCODONE 5 MG immediate release tablet Commonly known as: Oxy IR/ROXICODONE Take 1-2 tablets (5-10 mg total) by mouth every 4 (four) hours as needed for moderate pain (pain score 4-6).   pravastatin 40 MG tablet Commonly known as: PRAVACHOL Take 40 mg by mouth at bedtime.   sucroferric oxyhydroxide 500 MG  chewable tablet Commonly known as: VELPHORO Chew 1 tablet (500 mg total) by mouth 3 (three) times daily with meals.            Discharge Care Instructions  (From admission, onward)         Start     Ordered   02/20/20 0000  Discharge wound care:       Comments: Follow up with orthopedics in a week   02/20/20 1257          Follow-up Information    Persons, Bevely Palmer, PA In 1 week.   Specialty: Orthopedic Surgery Contact information: [place] [address] [phone]        [name], MD. Schedule an appointment as soon as possible for a visit in 3 week(s).   Specialty: Vascular Surgery Contact information: [place] [address] [phone]              No Known Allergies   Vascular surgery, nephrology, orthopedics   Procedures/Studies: DG Chest 2 View  Result Date: 02/14/2020 CLINICAL DATA:  Preop transmetatarsal amputation. EXAM: CHEST - 2 VIEW COMPARISON:  11/07/2019 FINDINGS: Right-sided dialysis catheter with tip in the lower SVC. Chronic cardiomegaly with unchanged mediastinal contours. Small bilateral pleural effusions, right greater than left. Vascular congestion appears similar. Suspicion of mild pulmonary edema with septal thickening  peripherally. No pneumothorax or confluent airspace disease. IMPRESSION: 1. Right-sided dialysis catheter with tip in the lower SVC. 2. Small bilateral pleural effusions, right greater than left, with vascular congestion and mild pulmonary edema. Electronically Signed   By: Keith Rake M.D.   On: 02/14/2020 21:33   PERIPHERAL VASCULAR CATHETERIZATION  Result Date: 02/12/2020 Patient name: NYXON STRUPP MRN: [id number] DOB: Jun 29, 1940 Sex: male 02/12/2020 Pre-operative Diagnosis: Right leg ulcers Post-operative diagnosis:  Same Surgeon:  Annamarie Major Procedure Performed:  1.  Ultrasound-guided access, left femoral artery  2.  Abdominal aortogram  3.  Right leg runoff  4.   Stent, right femoral-popliteal artery  5.  Stent, right tibioperoneal trunk  6.  Balloon angioplasty, right peroneal artery  7.  Conscious sedation, 138 minutes Indications: The patient has previously undergone a right femoral to below-knee popliteal artery bypass graft with vein.  He has developed ischemic changes to his toes.  His bypass graft is presumed to be occluded.  He comes in today for angiography and possible intervention. Procedure:  The patient was identified in the holding area and taken to room 8.  The patient was then placed supine on the table and prepped and draped in the usual sterile fashion.  A time out was called.  Conscious sedation was administered with the use of IV fentanyl and Versed under continuous physician and nurse monitoring.  Heart rate, blood pressure, and oxygen saturation were continuously monitored.  Total sedation time was 138 minutes.  Ultrasound was used to evaluate the left common femoral artery.  It was patent .  A digital ultrasound image was acquired.  A micropuncture needle was used to access the left common femoral artery under ultrasound guidance.  An 018 wire was advanced without resistance and a micropuncture sheath was placed.  The 018 wire was removed and a benson wire was placed.  The micropuncture sheath was exchanged for a 5 french sheath.  An omniflush catheter was advanced over the wire to the level of L-1.  An abdominal angiogram was obtained.  Next, using the omniflush catheter and a benson wire, the aortic bifurcation was crossed and the catheter was placed into theright external iliac artery and right runoff was obtained.  Findings:  Aortogram: No significant renal artery stenosis was identified.  No significant aortic stenosis identified.  No significant bilateral iliac stenosis was identified.  Right Lower Extremity: The right common femoral artery is widely patent.  The right profundofemoral artery is without stenosis.  The proximal portion of the  superficial femoral artery is diseased but patent and then occludes in the mid thigh with reconstitution of the below-knee popliteal artery with single-vessel runoff.  The right femoral-popliteal bypass graft opacifies proximally but then occludes just beyond its origin.  Left Lower Extremity: Not evaluated due to procedural time and patient's difficulty to sit still Intervention: After the above images were acquired the decision was made to proceed with intervention.  A 6 French 45 cm sheath was advanced over the aortic bifurcation and placed into the right external iliac artery.  The patient was fully heparinized.  I tried to recanalize the right superficial femoral artery.  I used a Glidewire advantage and a quick cross catheter.  With significant difficulty, I was ultimately able to get the catheter wire down into the distal thigh.  I struggled trying to pass the catheter further past the joint space.  I switched out to a glide catheter and tried multiple wires.  Ultimately, I was not ever  able to reenter into the below-knee popliteal artery.  I had created a large dissection plane which precluded further attempts at recanalization. Given that the patient had very little surgical options, I felt trying to recanalize his bypass graft was a reasonable option.  The wire was then directed into the proximal bypass graft.  Using the Glidewire advantage and a quick cross catheter I was ultimately able to cross through the bypass graft and get wire access into the below-knee popliteal artery.  This was confirmed with a contrast injection.  I then tried to advance a 5 x 100 INNOVA stent to cross the distal anastomosis however this would not pass.  The stent was removed and then a 4 x 100 balloon was inserted.  Balloon angioplasty across the distal anastomosis was performed and then I reinserted the 5 x 100 stent.  My intent was to deploy the stent into the below-knee popliteal artery, however upon deployment the stent  jumped forward into the tibioperoneal trunk.  I then elected to extend approximately it with overlapping 5 x 250 Viabahn stents.  2 were palced, with approximately 1 cm overlap and landing at the origin of the bypass graft.  The stents were then molded with a 5 mm balloon.  Follow-up imaging revealed a widely patent bypass graft.  There did appear to be occlusion of the peroneal artery at this time.  I then inserted a 3 x 100 Sterling balloon and perform balloon angioplasty of the peroneal artery.  After 2 inflations of the balloon in the peroneal artery a completion arteriogram was performed.  This showed inline flow through the bypass graft and into the peroneal artery which reconstitutes the posterior tibial across the ankle.  At this point, I elected to terminate the procedure.  The sheath was withdrawn and the patient be taken to the holding area for sheath pull. Impression:  #1  Occluded right superficial femoral and right femoral-popliteal bypass graft  #2  Failed attempt at recanalization of the native right superficial femoral and popliteal artery  #3  Successful recanalization of the right femoral below-knee popliteal artery bypass graft.  A 5 x 100 INNOVA stent was placed beginning in the tibioperoneal trunk extending up into the bypass graft.  I then placed 2 overlapping 5x250 Viabahn stents within the vein graft.  Following the procedure, the patient had inline flow through the recanalized bypass graft and into the peroneal artery which perfuses the foot. Theotis Burrow, M.D., [facility] [place] Office: [phone] Pager:  [phone]  DG Foot Complete Right  Result Date: 02/10/2020 CLINICAL DATA:  Osteomyelitis. 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 There are advanced vascular calcifications. There are degenerative changes of the midfoot. There is a large linear metallic foreign body in  the plantar soft tissues of the foot at the level of the mid first metatarsal. There is a moderate-sized plantar calcaneal spur. There is soft tissue swelling about the forefoot. No definite acute displaced fracture or dislocation. No evidence for osteomyelitis. IMPRESSION: 1. No evidence for osteomyelitis. 2. Large linear metallic foreign body in the plantar soft tissues of the foot at the level of the mid first metatarsal. 3. Soft tissue swelling about the forefoot. No acute displaced fracture or dislocation. Electronically Signed   By: Constance Holster M.D.   On: 02/10/2020 17:42       Subjective: Patient seen and examined at bedside this  morning.  Hemodynamically stable for discharge.  Discharge Exam: Vitals:   02/20/20 0437 02/20/20 1210  BP: (!) 94/48 (!) 111/38  Pulse: 78 96  Resp: 20 18  Temp: 98.5 F (36.9 C) 98.5 F (36.9 C)  SpO2: 99% 94%   Vitals:   02/19/20 2100 02/19/20 2247 02/20/20 0437 02/20/20 1210  BP:  (!) 106/55 (!) 94/48 (!) 111/38  Pulse:  80 78 96  Resp:  18 20 18   Temp: 97.9 F (36.6 C) 97.9 F (36.6 C) 98.5 F (36.9 C) 98.5 F (36.9 C)  TempSrc: Oral Oral Oral Oral  SpO2:  100% 99% 94%  Weight:      Height:        General: Pt is alert, awake, not in acute distress Cardiovascular: RRR, S1/S2 +, no rubs, no gallops Respiratory: CTA bilaterally, no wheezing, no rhonchi Abdominal: Soft, NT, ND, bowel sounds + Extremities: no edema, no cyanosis, dressing on the right foot, wound VAC    The results of significant diagnostics from this hospitalization (including imaging, microbiology, ancillary and laboratory) are listed below for reference.     Microbiology: Recent Results (from the past 240 hour(s))  SARS Coronavirus 2 by RT PCR (hospital order, performed in [place] hospital lab)     Status: None   Collection Time: 02/10/20  7:00 PM  Result Value Ref Range Status   SARS Coronavirus 2 NEGATIVE NEGATIVE Final    Comment: (NOTE) SARS-CoV-2  target nucleic acids are NOT DETECTED.  The SARS-CoV-2 RNA is generally detectable in upper and lower respiratory specimens during the acute phase of infection. The lowest concentration of SARS-CoV-2 viral copies this assay can detect is 250 copies / mL. A negative result does not preclude SARS-CoV-2 infection and should not be used as the sole basis for treatment or other patient management decisions.  A negative result may occur with improper specimen collection / handling, submission of specimen other than nasopharyngeal swab, presence of viral mutation(s) within the areas targeted by this assay, and inadequate number of viral copies (<250 copies / mL). A negative result must be combined with clinical observations, patient history, and epidemiological information.  Fact Sheet for Patients:   StrictlyIdeas.no  Fact Sheet for Healthcare Providers: BankingDealers.co.za  This test is not yet approved or  cleared by the [place] FDA and has been authorized for detection and/or diagnosis of SARS-CoV-2 by FDA under an Emergency Use Authorization (EUA).  This EUA will remain in effect (meaning this test can be used) for the duration of the COVID-19 declaration under Section 564(b)(1) of the Act, 21 U.S.C. section 360bbb-3(b)(1), unless the authorization is terminated or revoked sooner.  Performed at [facility], [place] [address], [place], [place] [address]   MRSA PCR Screening     Status: None   Collection Time: 02/11/20 11:07 PM   Specimen: Nasopharyngeal  Result Value Ref Range Status   MRSA by PCR NEGATIVE NEGATIVE Final    Comment:        The GeneXpert MRSA Assay (FDA approved for NASAL specimens only), is one component of a comprehensive MRSA colonization surveillance program. It is not intended to diagnose MRSA infection nor to guide or monitor treatment for MRSA infections. Performed at [facility], [place] [address], [place], [place] [address]   Culture, blood (routine x 2)     Status: None   Collection Time: 02/12/20 11:08 PM   Specimen: BLOOD  Result Value Ref Range Status   Specimen  Description BLOOD LEFT UPPER HAND  Final   Special Requests   Final    BOTTLES DRAWN AEROBIC ONLY Blood Culture adequate volume   Culture   Final    NO GROWTH 5 DAYS Performed at [facility], [address], [place], [place] [address]    Report Status 02/18/2020 FINAL  Final  Culture, blood (routine x 2)     Status: None   Collection Time: 02/12/20 11:08 PM   Specimen: BLOOD  Result Value Ref Range Status   Specimen Description BLOOD LEFT LOWER HAND  Final   Special Requests   Final    BOTTLES DRAWN AEROBIC ONLY Blood Culture adequate volume   Culture   Final    NO GROWTH 5 DAYS Performed at [facility], [place] [address], [place], [place] [address]    Report Status 02/18/2020 FINAL  Final  Surgical pcr screen     Status: None   Collection Time: 02/14/20  9:08 PM   Specimen: Nasal Mucosa; Nasal Swab  Result Value Ref Range Status   MRSA, PCR NEGATIVE NEGATIVE Final   Staphylococcus aureus NEGATIVE NEGATIVE Final    Comment: (NOTE) The Xpert SA Assay (FDA approved for NASAL specimens in patients 32 years of age and older), is one component of a comprehensive surveillance program. It is not intended to diagnose infection nor to guide or monitor treatment. Performed at [facility], [place] [address], [place], [place] [address]      Labs: BNP (last 3 results) Recent Labs    10/21/19 0023  BNP 2,130.8*   Basic Metabolic Panel: Recent Labs  Lab 02/14/20 1633 02/15/20 0256 02/16/20 1210 02/18/20 0130 02/19/20 0246 02/20/20 0127  NA 133* 135 133* 134* 132* 134*  K 3.8 4.2 4.2 4.4 5.0 4.4  CL 94* 96* 96* 96* 93* 96*  CO2 24 24 23 25 22 25   GLUCOSE 143* 118* 152* 154* 156* 141*  BUN 47* 29* 45* 47* 60* 43*  CREATININE 6.46* 4.98* 6.57* 6.69* 7.79* 6.05*   CALCIUM 8.9 9.1 8.8* 8.7* 8.8* 8.1*  MG  --   --   --   --  2.7* 2.6*  PHOS 5.2*  --  5.8* 5.7* 5.6* 3.6   Liver Function Tests: Recent Labs  Lab 02/14/20 1633 02/16/20 1210 02/18/20 0130 02/19/20 0246 02/20/20 0127  AST  --   --   --  30  --   ALT  --   --   --  16  --   ALKPHOS  --   --   --  75  --   BILITOT  --   --   --  0.9  --   PROT  --   --   --  5.7*  --   ALBUMIN 2.8* 2.8* 2.5* 2.9* 2.6*   No results for input(s): LIPASE, AMYLASE in the last 168 hours. No results for input(s): AMMONIA in the last 168 hours. CBC: Recent Labs  Lab 02/15/20 0256 02/16/20 1210 02/18/20 0130 02/19/20 0246 02/20/20 0127  WBC 15.7* 14.0* 14.4* 15.8* 12.8*  NEUTROABS 3.5  --  4.2 4.6 4.0  HGB 11.3* 10.7* 10.0* 10.7* 9.5*  HCT 37.7* 34.8* 33.5* 33.4* 30.1*  MCV 88.5 89.0 88.2 86.1 86.7  PLT 117* 123* 122* 143* 144*   Cardiac Enzymes: No results for input(s): CKTOTAL, CKMB, CKMBINDEX, TROPONINI in the last 168 hours. BNP: Invalid input(s): POCBNP CBG: Recent Labs  Lab 02/19/20 0810 02/19/20 1208 02/19/20 2100  02/20/20 0746 02/20/20 1158  GLUCAP 142* 137* 116* 117* 168*   D-Dimer No results for input(s): DDIMER in the last 72 hours. Hgb A1c No results for input(s): HGBA1C in the last 72 hours. Lipid Profile No results for input(s): CHOL, HDL, LDLCALC, TRIG, CHOLHDL, LDLDIRECT in the last 72 hours. Thyroid function studies No results for input(s): TSH, T4TOTAL, T3FREE, THYROIDAB in the last 72 hours.  Invalid input(s): FREET3 Anemia work up No results for input(s): VITAMINB12, FOLATE, FERRITIN, TIBC, IRON, RETICCTPCT in the last 72 hours. Urinalysis    Component Value Date/Time   COLORURINE YELLOW 07/11/2018 0802   APPEARANCEUR CLEAR 07/11/2018 0802   LABSPEC 1.009 07/11/2018 0802   PHURINE 6.0 07/11/2018 0802   GLUCOSEU NEGATIVE 07/11/2018 0802   HGBUR NEGATIVE 07/11/2018 0802   BILIRUBINUR NEGATIVE 07/11/2018 0802   KETONESUR NEGATIVE 07/11/2018 0802    PROTEINUR 100 (A) 07/11/2018 0802   NITRITE NEGATIVE 07/11/2018 0802   LEUKOCYTESUR NEGATIVE 07/11/2018 0802   Sepsis Labs Invalid input(s): PROCALCITONIN,  WBC,  LACTICIDVEN Microbiology Recent Results (from the past 240 hour(s))  SARS Coronavirus 2 by RT PCR (hospital order, performed in [place] hospital lab)     Status: None   Collection Time: 02/10/20  7:00 PM  Result Value Ref Range Status   SARS Coronavirus 2 NEGATIVE NEGATIVE Final    Comment: (NOTE) SARS-CoV-2 target nucleic acids are NOT DETECTED.  The SARS-CoV-2 RNA is generally detectable in upper and lower respiratory specimens during the acute phase of infection. The lowest concentration of SARS-CoV-2 viral copies this assay can detect is 250 copies / mL. A negative result does not preclude SARS-CoV-2 infection and should not be used as the sole basis for treatment or other patient management decisions.  A negative result may occur with improper specimen collection / handling, submission of specimen other than nasopharyngeal swab, presence of viral mutation(s) within the areas targeted by this assay, and inadequate number of viral copies (<250 copies / mL). A negative result must be combined with clinical observations, patient history, and epidemiological information.  Fact Sheet for Patients:   StrictlyIdeas.no  Fact Sheet for Healthcare Providers: BankingDealers.co.za  This test is not yet approved or  cleared by the [place] FDA and has been authorized for detection and/or diagnosis of SARS-CoV-2 by FDA under an Emergency Use Authorization (EUA).  This EUA will remain in effect (meaning this test can be used) for the duration of the COVID-19 declaration under Section 564(b)(1) of the Act, 21 U.S.C. section 360bbb-3(b)(1), unless the authorization is terminated or revoked sooner.  Performed at [facility], [place] [address], [place], [place] [address]   MRSA PCR Screening     Status: None   Collection Time: 02/11/20 11:07 PM   Specimen: Nasopharyngeal  Result Value Ref Range Status   MRSA by PCR NEGATIVE NEGATIVE Final    Comment:        The GeneXpert MRSA Assay (FDA approved for NASAL specimens only), is one component of a comprehensive MRSA colonization surveillance program. It is not intended to diagnose MRSA infection nor to guide or monitor treatment for MRSA infections. Performed at [facility], [place] [address] [place], [place] [address]   Culture, blood (routine x 2)     Status: None   Collection Time: 02/12/20 11:08 PM   Specimen: BLOOD  Result Value Ref Range Status   Specimen Description BLOOD LEFT UPPER HAND  Final   Special Requests   Final  BOTTLES DRAWN AEROBIC ONLY Blood Culture adequate volume   Culture   Final    NO GROWTH 5 DAYS Performed at [facility], [place] [address], [place], [place] [address]    Report Status 02/18/2020 FINAL  Final  Culture, blood (routine x 2)     Status: None   Collection Time: 02/12/20 11:08 PM   Specimen: BLOOD  Result Value Ref Range Status   Specimen Description BLOOD LEFT LOWER HAND  Final   Special Requests   Final    BOTTLES DRAWN AEROBIC ONLY Blood Culture adequate volume   Culture   Final    NO GROWTH 5 DAYS Performed at [facility], [place] [address] [place], [place] [address]    Report Status 02/18/2020 FINAL  Final  Surgical pcr screen     Status: None   Collection Time: 02/14/20  9:08 PM   Specimen: Nasal Mucosa; Nasal Swab  Result Value Ref Range Status   MRSA, PCR NEGATIVE NEGATIVE Final   Staphylococcus aureus NEGATIVE NEGATIVE Final    Comment: (NOTE) The Xpert SA Assay (FDA approved for NASAL specimens in patients 14 years of age and older), is one component of a comprehensive surveillance program. It is not intended to diagnose infection nor to guide or monitor treatment. Performed at [facility], [place] [address], [place], [place] [address]     Please note: You were cared for by a hospitalist during your hospital stay. Once you are discharged, your primary care physician will handle any further medical issues. Please note that NO REFILLS for any discharge medications will be authorized once you are discharged, as it is imperative that you return to your primary care physician (or establish a relationship with a primary care physician if you do not have one) for your post hospital discharge needs so that they can reassess your need for medications and monitor your lab values.    Time coordinating discharge: 40 minutes  SIGNED:   Shelly Coss, MD  [facility] 02/20/2020, 12:58 PM Pager [phone]  If 7PM-7AM, please contact night-coverage [email or web address] Password [email or web address]

## 2020-02-20 NOTE — Plan of Care (Signed)
  Problem: Education: Goal: Knowledge of disease and its progression will improve Outcome: Progressing Goal: Individualized Educational Video(s) Outcome: Progressing   Problem: Fluid Volume: Goal: Compliance with measures to maintain balanced fluid volume will improve Outcome: Progressing   Problem: Health Behavior/Discharge Planning: Goal: Ability to manage health-related needs will improve Outcome: Progressing   Problem: Nutritional: Goal: Ability to make healthy dietary choices will improve Outcome: Progressing   Problem: Clinical Measurements: Goal: Complications related to the disease process, condition or treatment will be avoided or minimized Outcome: Progressing   Problem: Education: Goal: Knowledge of General Education information will improve Description: Including pain rating scale, medication(s)/side effects and non-pharmacologic comfort measures Outcome: Progressing   Problem: Health Behavior/Discharge Planning: Goal: Ability to manage health-related needs will improve Outcome: Progressing   Problem: Clinical Measurements: Goal: Ability to maintain clinical measurements within normal limits will improve Outcome: Progressing Goal: Will remain free from infection Outcome: Progressing Goal: Diagnostic test results will improve Outcome: Progressing Goal: Respiratory complications will improve Outcome: Progressing Goal: Cardiovascular complication will be avoided Outcome: Progressing   Problem: Activity: Goal: Risk for activity intolerance will decrease Outcome: Progressing   Problem: Nutrition: Goal: Adequate nutrition will be maintained Outcome: Progressing   Problem: Coping: Goal: Level of anxiety will decrease Outcome: Progressing   Problem: Elimination: Goal: Will not experience complications related to bowel motility Outcome: Progressing Goal: Will not experience complications related to urinary retention Outcome: Progressing   Problem: Pain  Managment: Goal: General experience of comfort will improve Outcome: Progressing   Problem: Safety: Goal: Ability to remain free from injury will improve Outcome: Progressing   Problem: Skin Integrity: Goal: Risk for impaired skin integrity will decrease Outcome: Progressing   Problem: Education: Goal: Knowledge of the prescribed therapeutic regimen will improve Outcome: Progressing Goal: Ability to verbalize activity precautions or restrictions will improve Outcome: Progressing Goal: Understanding of discharge needs will improve Outcome: Progressing   Problem: Activity: Goal: Ability to perform//tolerate increased activity and mobilize with assistive devices will improve Outcome: Progressing   Problem: Clinical Measurements: Goal: Postoperative complications will be avoided or minimized Outcome: Progressing   Problem: Self-Care: Goal: Ability to meet self-care needs will improve Outcome: Progressing   Problem: Self-Concept: Goal: Ability to maintain and perform role responsibilities to the fullest extent possible will improve Outcome: Progressing   Problem: Pain Management: Goal: Pain level will decrease with appropriate interventions Outcome: Progressing   Problem: Skin Integrity: Goal: Demonstration of wound healing without infection will improve Outcome: Progressing

## 2020-02-20 NOTE — Progress Notes (Signed)
Report called to [place], LPN @ [facility].

## 2020-02-20 NOTE — Progress Notes (Signed)
Subjective:  No cos , said tolerated hd yest.1.5 l uf / noted for dc   Objective Vital signs in last 24 hours: Vitals:   02/19/20 2100 02/19/20 2247 02/20/20 0437 02/20/20 1210  BP:  (!) 106/55 (!) 94/48 (!) 111/38  Pulse:  80 78 96  Resp:  18 20 18   Temp: 97.9 F (36.6 C) 97.9 F (36.6 C) 98.5 F (36.9 C) 98.5 F (36.9 C)  TempSrc: Oral Oral Oral Oral  SpO2:  100% 99% 94%  Weight:      Height:       Weight change: -4.9 kg   Physical Exam General:Alert, elderly male,NAD Heart:RRR, no mrg Lungs: CTAB, breath sounds decreased at bases, nml WOB on RA Abdomen:soft, NTND Extremities:no LE edema, right transmit wound VAC placed Dialysis Access:TDC, RU AVF +b/t   Dialysis Orders: Center:[facility]. 180NRe, 4 hours, BFR 400, DFR 800, EDW 85kg, 2K/2.5Ca, TDC, no heparin No ESA Calcitriol 0.75 mcg PO q HD   Problem/Plan: 1. Gangrene of R foot/PAD: [facility] following,s/parteriogram on 2/2/22and TMA2/4/22by Dr. Sharol Given.  Wound VAC onantibiotics per primary team.VVS recommend plavix and ASA on d/c with f/u of fem pop bypass with ABI in 1 month. TMA per Dr. Sharol Given.  2. ESRD:TTS schedule. HD isshortened due to high census/nursing shortage.. 3. Hypotension/volume:BP controlled.Continue midodrine prior to HD.No edema on exam.UF as tolerated.has wound vac  So no stand wts 4. Anemia:Hgb9.5  follow-up trend time to start as op  ESA with hgb  < 10.5  5. Metabolic bone disease:Calcium controlled.Phos3.6, started  phosphate binder Auryxia  but hard to swallow  Changed to  chewable VELPHORO and tolerating per pt. 6. T2DM:Mgt per primary team 7. Thrombocytopenia: Stable, 144, no heparin with HD   Ernest Haber, PA-C [facility] [phone] 02/20/2020,3:33 PM  LOS: 10 days   Labs: Basic Metabolic Panel: Recent Labs  Lab 02/18/20 0130 02/19/20 0246 02/20/20 0127  NA 134* 132* 134*  K 4.4 5.0 4.4  CL 96* 93* 96*  CO2 25 22 25    GLUCOSE 154* 156* 141*  BUN 47* 60* 43*  CREATININE 6.69* 7.79* 6.05*  CALCIUM 8.7* 8.8* 8.1*  PHOS 5.7* 5.6* 3.6   Liver Function Tests: Recent Labs  Lab 02/18/20 0130 02/19/20 0246 02/20/20 0127  AST  --  30  --   ALT  --  16  --   ALKPHOS  --  75  --   BILITOT  --  0.9  --   PROT  --  5.7*  --   ALBUMIN 2.5* 2.9* 2.6*   No results for input(s): LIPASE, AMYLASE in the last 168 hours. No results for input(s): AMMONIA in the last 168 hours. CBC: Recent Labs  Lab 02/15/20 0256 02/16/20 1210 02/18/20 0130 02/19/20 0246 02/20/20 0127  WBC 15.7* 14.0* 14.4* 15.8* 12.8*  NEUTROABS 3.5  --  4.2 4.6 4.0  HGB 11.3* 10.7* 10.0* 10.7* 9.5*  HCT 37.7* 34.8* 33.5* 33.4* 30.1*  MCV 88.5 89.0 88.2 86.1 86.7  PLT 117* 123* 122* 143* 144*   Cardiac Enzymes: No results for input(s): CKTOTAL, CKMB, CKMBINDEX, TROPONINI in the last 168 hours. CBG: Recent Labs  Lab 02/19/20 0810 02/19/20 1208 02/19/20 2100 02/20/20 0746 02/20/20 1158  GLUCAP 142* 137* 116* 117* 168*    Studies/Results: No results found. Medications:  . aspirin EC  81 mg Oral Daily  . calcitRIOL  0.25 mcg Oral Q M,W,F  . Chlorhexidine Gluconate Cloth  6 each Topical Q0600  . Chlorhexidine Gluconate  Cloth  6 each Topical [id number]  . clopidogrel  75 mg Oral Daily  . cyanocobalamin  1,000 mcg Intramuscular Q30 days  . docusate sodium  100 mg Oral BID  . folic acid  1 mg Oral Daily  . heparin  5,000 Units Subcutaneous Q8H  . insulin aspart  0-15 Units Subcutaneous TID WC  . insulin aspart  0-5 Units Subcutaneous QHS  . midodrine  10 mg Oral Q T,Th,Sa-HD  . multivitamin  1 tablet Oral QHS  . pravastatin  40 mg Oral QHS  . sucroferric oxyhydroxide  500 mg Oral TID WC

## 2020-02-25 ENCOUNTER — Encounter: Payer: Self-pay | Admitting: Physician Assistant

## 2020-02-25 ENCOUNTER — Ambulatory Visit (INDEPENDENT_AMBULATORY_CARE_PROVIDER_SITE_OTHER): Payer: Medicare Other | Admitting: Orthopedic Surgery

## 2020-02-25 DIAGNOSIS — Z89432 Acquired absence of left foot: Secondary | ICD-10-CM

## 2020-02-25 NOTE — Progress Notes (Signed)
Office Visit Note   Patient: Thomas Mcguire           Date of Birth: 1940/12/11           MRN: [id number] Visit Date: 02/25/2020              Requested by: Leanna Battles, [place],  [place] [address] PCP: Leanna Battles, MD  Chief Complaint  Patient presents with  . Right Foot - Routine Post Op    02/15/20 right transmet amputation       HPI: Patient is a 80 year old gentleman who presents in follow-up status post transmetatarsal amputation he is 10 days out from surgery.  Patient does have bright red bloody drainage he is currently on Plavix and aspirin.  Assessment & Plan: Visit Diagnoses:  1. History of transmetatarsal amputation of left foot ([place])     Plan: Patient was given a prescription to hold the Plavix for 5 days dry dressing changes daily nonweightbearing on the right follow-up in 1 week.  Follow-Up Instructions: Return in about 4 weeks (around 03/24/2020).   Ortho Exam  Patient is alert, oriented, no adenopathy, well-dressed, normal affect, normal respiratory effort. Examination the surgical incision is well approximated there is no ischemic changes there is no purulence no signs of infection no cellulitis.  Patient does have a small amount of bright red blood consistent with his use of both Plavix and aspirin.  We will have him hold the Plavix for 5 days.  Imaging: No results found. No images are attached to the encounter.  Labs: Lab Results  Component Value Date   HGBA1C 6.1 (H) 02/11/2020   HGBA1C 6.2 (H) 11/07/2019   HGBA1C 6.8 (H) 07/11/2018   CRP <0.8 10/14/2017   LABURIC 5.8 11/02/2017   REPTSTATUS 02/18/2020 FINAL 02/12/2020   REPTSTATUS 02/18/2020 FINAL 02/12/2020   CULT  02/12/2020    NO GROWTH 5 DAYS Performed at [facility], [place] [address], [place], [place] [address]    CULT  02/12/2020    NO GROWTH 5 DAYS Performed at [place] [address], [place],  [address]      Lab Results   Component Value Date   ALBUMIN 2.6 (L) 02/20/2020   ALBUMIN 2.9 (L) 02/19/2020   ALBUMIN 2.5 (L) 02/18/2020   LABURIC 5.8 11/02/2017    Lab Results  Component Value Date   MG 2.6 (H) 02/20/2020   MG 2.7 (H) 02/19/2020   MG 2.1 11/11/2019   No results found for: VD25OH  No results found for: PREALBUMIN CBC EXTENDED Latest Ref Rng & Units 02/20/2020 02/19/2020 02/18/2020  WBC 4.0 - 10.5 K/uL 12.8(H) 15.8(H) 14.4(H)  RBC 4.22 - 5.81 MIL/uL 3.47(L) 3.88(L) 3.80(L)  HGB 13.0 - 17.0 g/dL 9.5(L) 10.7(L) 10.0(L)  HCT 39.0 - 52.0 % 30.1(L) 33.4(L) 33.5(L)  PLT 150 - 400 K/uL 144(L) 143(L) 122(L)  NEUTROABS 1.7 - 7.7 K/uL 4.0 4.6 4.2  LYMPHSABS 0.7 - 4.0 K/uL 7.3(H) 9.4(H) 9.8(H)     There is no height or weight on file to calculate BMI.  Orders:  No orders of the defined types were placed in this encounter.  No orders of the defined types were placed in this encounter.    Procedures: No procedures performed  Clinical Data: No additional findings.  ROS:  All other systems negative, except as noted in the HPI. Review of Systems  Objective: Vital Signs: There were no vitals taken for this visit.  Specialty Comments:  No specialty comments available.  PMFS History: Patient Active Problem List   Diagnosis Date Noted  . Necrotic toes ([place]) 02/11/2020  . Cellulitis and abscess of toe of right foot 02/10/2020  . Cellulitis of right foot 02/10/2020  . Fluid overload 11/07/2019  . Cardiomegaly 11/07/2019  . DM2 (diabetes mellitus, type 2) ([place]) 11/07/2019  . CKD (chronic kidney disease) 10/06/2018  . PVD (peripheral vascular disease) ([place]) 07/11/2018  . B12 deficiency 11/30/2017  . Folic acid deficiency 43/15/4008  . 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 Anemia    low iron  . Arthritis   . Chronic renal insufficiency    CHECKED Q3MOS PER PT. Stage 4 as of 05/22/2018 per pt.  . Diabetes mellitus    type  2  . ESRD (end stage renal disease) ([place])   . Gout    Has not had recently- 08/24/11  . Hyperlipidemia   . Hypertension   . 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 PAD (peripheral artery disease) ([place])   . Wears dentures     Family History  Problem Relation Age of Onset  . Colon cancer Neg Hx     Past Surgical History:  Procedure Laterality Date  . A/V FISTULAGRAM N/A 08/11/2018   Procedure: A/V FISTULAGRAM - Right Arm;  Surgeon: Angelia Mould, MD;  Location: [facility];  Service: Cardiovascular;  Laterality: N/A;  . A/V FISTULAGRAM N/A 09/07/2019   Procedure: A/V FISTULAGRAM - Right AVF;  Surgeon: Angelia Mould, MD;  Location: [facility];  Service: Cardiovascular;  Laterality: N/A;  . ABDOMINAL AORTOGRAM W/LOWER EXTREMITY Right 07/03/2018   Procedure: ABDOMINAL AORTOGRAM W/LOWER EXTREMITY;  Surgeon: Waynetta Sandy, MD;  Location: [facility];  Service: Cardiovascular;  Laterality: Right;  . ABDOMINAL AORTOGRAM W/LOWER EXTREMITY N/A 02/12/2020   Procedure: ABDOMINAL AORTOGRAM W/LOWER EXTREMITY;  Surgeon: Serafina Mitchell, MD;  Location: [facility];  Service: Cardiovascular;  Laterality: N/A;  . AMPUTATION  08/27/2011   Procedure: AMPUTATION DIGIT;  Surgeon: Newt Minion, MD;  Location: [facility];  Service: Orthopedics;  Laterality: Left;  Left Foot 3rd toe Amputation MTP joint  . AMPUTATION Right 07/11/2018   Procedure: AMPUTATION RIGHT GREAT TOE;  Surgeon: Angelia Mould, MD;  Location: [facility];  Service: Vascular;  Laterality: Right;  . AMPUTATION Right 02/15/2020   Procedure: TRANSMETATARSAL AMPUTATION;  Surgeon: Newt Minion, MD;  Location: [facility];  Service: Orthopedics;  Laterality: Right;  . amputation great toe  2009   left  . AV FISTULA PLACEMENT Right 05/23/2018   Procedure: RADIAL- CEPHALIC ARTERIOVENOUS (AV) FISTULA CREATION RIGHT ARM AND LIGATION OF COMPETING BRANCH.;  Surgeon: Angelia Mould, MD;  Location: [facility];  Service: Vascular;  Laterality: Right;  . BASCILIC VEIN TRANSPOSITION Right 10/01/2019   Procedure: FIRST STAGE BASCILIC VEIN TRANSPOSITION;  Surgeon: Angelia Mould, MD;  Location: [facility];  Service: Vascular;  Laterality: Right;  . BASCILIC VEIN TRANSPOSITION Right 11/13/2019   Procedure: RIGHT SECOND STAGE BASCILIC VEIN TRANSPOSITION;  Surgeon: Rosetta Posner, MD;  Location: [facility];  Service: Vascular;  Laterality: Right;  . COLONOSCOPY    . cyst elbow  1999   right  . FEMORAL-POPLITEAL BYPASS GRAFT Right 07/11/2018   Procedure: BYPASS GRAFT FEMORAL-POPLITEAL ARTERY;  Surgeon: Angelia Mould, MD;  Location: [facility];  Service: Vascular;  Laterality: Right;  .  FOOT SURGERY     both feet for something that was torn  . IR FLUORO GUIDE CV LINE RIGHT  11/08/2019  . IR US GUIDE VASC ACCESS RIGHT  11/08/2019  . LIGATION OF ARTERIOVENOUS  FISTULA Right 10/01/2019   Procedure: LIGATION OF RIGHT RADIOCEPHALIC  ARTERIOVENOUS  FISTULA;  Surgeon: Angelia Mould, MD;  Location: [facility];  Service: Vascular;  Laterality: Right;  . PERIPHERAL VASCULAR BALLOON ANGIOPLASTY Right 02/12/2020   Procedure: PERIPHERAL VASCULAR BALLOON ANGIOPLASTY;  Surgeon: Serafina Mitchell, MD;  Location: [facility];  Service: Cardiovascular;  Laterality: Right;  peroneal  . PERIPHERAL VASCULAR INTERVENTION Right 02/12/2020   Procedure: PERIPHERAL VASCULAR INTERVENTION;  Surgeon: Serafina Mitchell, MD;  Location: [facility];  Service: Cardiovascular;  Laterality: Right;  fem/pop bypass graft  . TOE AMPUTATION  2010   left 2nd toe  . UPPER GI ENDOSCOPY     Social History   Occupational History  . Not on file  Tobacco Use  . Smoking status: Former Smoker    Types: Cigarettes    Quit date: 03/11/1996    Years since quitting: 23.9  . Smokeless tobacco: Never Used  Vaping Use  . Vaping Use: Never used  Substance and Sexual Activity  . Alcohol use: No  . Drug use: No  .  Sexual activity: Not Currently

## 2020-03-03 ENCOUNTER — Ambulatory Visit: Payer: Medicare Other | Admitting: Physician Assistant

## 2020-03-10 ENCOUNTER — Ambulatory Visit (INDEPENDENT_AMBULATORY_CARE_PROVIDER_SITE_OTHER): Payer: Medicare Other | Admitting: Physician Assistant

## 2020-03-10 ENCOUNTER — Encounter: Payer: Self-pay | Admitting: Orthopedic Surgery

## 2020-03-10 DIAGNOSIS — L03115 Cellulitis of right lower limb: Secondary | ICD-10-CM

## 2020-03-10 NOTE — Progress Notes (Signed)
Office Visit Note   Patient: Thomas Mcguire           Date of Birth: 07/06/1940           MRN: [id number] Visit Date: 03/10/2020              Requested by: Leanna Battles, [place],  [place] [id number] PCP: Leanna Battles, MD  Chief Complaint  Patient presents with  . Right Foot - Routine Post Op    02/15/20 right transmet amputation       HPI: Patient is a pleasant 80 year old gentleman who is 3 and half weeks status post right transmetatarsal amputation.  He is currently residing in a skilled nursing facility.  He notices 1 area of the wound that still has some minimal bleeding  Assessment & Plan: Visit Diagnoses: No diagnosis found.  Plan: Patient should continue to be nonweightbearing until the remainder of the sutures are removed hopefully next week.  He will need eventually a prescription for extra-depth shoes and a carbon fiber plate and filler.  He will follow-up in 1 week.  Continue with wound care at the nursing facility emphasis on swelling control  Follow-Up Instructions: No follow-ups on file.   Ortho Exam  Patient is alert, oriented, no adenopathy, well-dressed, normal affect, normal respiratory effort.  Examination of his transmetatarsal amputation stump no cellulitis no foul odor he does have some bloody drainage in an area in the midportion of the wound that is still healing.  There is no frank dehiscence.  Sutures on the outside of the wound and towards the central portion of the wound that were loose removed to reveal well apposed wound edges no ascending cellulitis  Imaging: No results found. No images are attached to the encounter.  Labs: Lab Results  Component Value Date   HGBA1C 6.1 (H) 02/11/2020   HGBA1C 6.2 (H) 11/07/2019   HGBA1C 6.8 (H) 07/11/2018   CRP <0.8 10/14/2017   LABURIC 5.8 11/02/2017   REPTSTATUS 02/18/2020 FINAL 02/12/2020   REPTSTATUS 02/18/2020 FINAL 02/12/2020   CULT  02/12/2020    NO GROWTH 5  DAYS Performed at [facility], [place] [address], [place], [place] [address]    CULT  02/12/2020    NO GROWTH 5 DAYS Performed at [facility] [address] [place], [place] [address]      Lab Results  Component Value Date   ALBUMIN 2.6 (L) 02/20/2020   ALBUMIN 2.9 (L) 02/19/2020   ALBUMIN 2.5 (L) 02/18/2020   LABURIC 5.8 11/02/2017    Lab Results  Component Value Date   MG 2.6 (H) 02/20/2020   MG 2.7 (H) 02/19/2020   MG 2.1 11/11/2019   No results found for: VD25OH  No results found for: PREALBUMIN CBC EXTENDED Latest Ref Rng & Units 02/20/2020 02/19/2020 02/18/2020  WBC 4.0 - 10.5 K/uL 12.8(H) 15.8(H) 14.4(H)  RBC 4.22 - 5.81 MIL/uL 3.47(L) 3.88(L) 3.80(L)  HGB 13.0 - 17.0 g/dL 9.5(L) 10.7(L) 10.0(L)  HCT 39.0 - 52.0 % 30.1(L) 33.4(L) 33.5(L)  PLT 150 - 400 K/uL 144(L) 143(L) 122(L)  NEUTROABS 1.7 - 7.7 K/uL 4.0 4.6 4.2  LYMPHSABS 0.7 - 4.0 K/uL 7.3(H) 9.4(H) 9.8(H)     There is no height or weight on file to calculate BMI.  Orders:  No orders of the defined types were placed in this encounter.  No orders of the defined types were placed in this encounter.    Procedures: No procedures performed  Clinical Data: No additional findings.  ROS:  All other systems negative, except as noted in the HPI. Review of Systems  Objective: Vital Signs: There were no vitals taken for this visit.  Specialty Comments:  No specialty comments available.  PMFS History: Patient Active Problem List   Diagnosis Date Noted  . Necrotic toes ([place]) 02/11/2020  . Cellulitis and abscess of toe of right foot 02/10/2020  . Cellulitis of right foot 02/10/2020  . Fluid overload 11/07/2019  . Cardiomegaly 11/07/2019  . DM2 (diabetes mellitus, type 2) ([place]) 11/07/2019  . CKD (chronic kidney disease) 10/06/2018  . PVD (peripheral vascular disease) ([place]) 07/11/2018  . B12 deficiency 11/30/2017  . Folic acid deficiency 61/44/3154  . Chronic lymphocytic leukemia (CLL),  B-cell ([place]) 11/30/2017   Past Medical History:  Diagnosis Date  . Anemia    low iron  . Arthritis   . Chronic renal insufficiency    CHECKED Q3MOS PER PT. Stage 4 as of 05/22/2018 per pt.  . Diabetes mellitus    type 2  . ESRD (end stage renal disease) ([place])   . Gout    Has not had recently- 08/24/11  . Hyperlipidemia   . Hypertension   . 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 PAD (peripheral artery disease) ([place])   . Wears dentures     Family History  Problem Relation Age of Onset  . Colon cancer Neg Hx     Past Surgical History:  Procedure Laterality Date  . A/V FISTULAGRAM N/A 08/11/2018   Procedure: A/V FISTULAGRAM - Right Arm;  Surgeon: Angelia Mould, MD;  Location: [facility];  Service: Cardiovascular;  Laterality: N/A;  . A/V FISTULAGRAM N/A 09/07/2019   Procedure: A/V FISTULAGRAM - Right AVF;  Surgeon: Angelia Mould, MD;  Location: [facility];  Service: Cardiovascular;  Laterality: N/A;  . ABDOMINAL AORTOGRAM W/LOWER EXTREMITY Right 07/03/2018   Procedure: ABDOMINAL AORTOGRAM W/LOWER EXTREMITY;  Surgeon: Waynetta Sandy, MD;  Location: [facility];  Service: Cardiovascular;  Laterality: Right;  . ABDOMINAL AORTOGRAM W/LOWER EXTREMITY N/A 02/12/2020   Procedure: ABDOMINAL AORTOGRAM W/LOWER EXTREMITY;  Surgeon: Serafina Mitchell, MD;  Location: [facility];  Service: Cardiovascular;  Laterality: N/A;  . AMPUTATION  08/27/2011   Procedure: AMPUTATION DIGIT;  Surgeon: Newt Minion, MD;  Location: [facility];  Service: Orthopedics;  Laterality: Left;  Left Foot 3rd toe Amputation MTP joint  . AMPUTATION Right 07/11/2018   Procedure: AMPUTATION RIGHT GREAT TOE;  Surgeon: Angelia Mould, MD;  Location: [facility];  Service: Vascular;  Laterality: Right;  . AMPUTATION Right 02/15/2020   Procedure: TRANSMETATARSAL AMPUTATION;  Surgeon: Newt Minion, MD;  Location: [facility];  Service: Orthopedics;  Laterality:  Right;  . amputation great toe  2009   left  . AV FISTULA PLACEMENT Right 05/23/2018   Procedure: RADIAL- CEPHALIC ARTERIOVENOUS (AV) FISTULA CREATION RIGHT ARM AND LIGATION OF COMPETING BRANCH.;  Surgeon: Angelia Mould, MD;  Location: [facility];  Service: Vascular;  Laterality: Right;  . BASCILIC VEIN TRANSPOSITION Right 10/01/2019   Procedure: FIRST STAGE BASCILIC VEIN TRANSPOSITION;  Surgeon: Angelia Mould, MD;  Location: [facility];  Service: Vascular;  Laterality: Right;  . BASCILIC VEIN TRANSPOSITION Right 11/13/2019   Procedure: RIGHT SECOND STAGE BASCILIC VEIN TRANSPOSITION;  Surgeon: Rosetta Posner, MD;  Location: [facility];  Service: Vascular;  Laterality: Right;  . COLONOSCOPY    . cyst elbow  1999   right  . FEMORAL-POPLITEAL BYPASS GRAFT Right 07/11/2018   Procedure: BYPASS GRAFT FEMORAL-POPLITEAL ARTERY;  Surgeon: Angelia Mould, MD;  Location: [facility];  Service: Vascular;  Laterality: Right;  . FOOT SURGERY     both feet for something that was torn  . IR FLUORO GUIDE CV LINE RIGHT  11/08/2019  . IR US GUIDE VASC ACCESS RIGHT  11/08/2019  . LIGATION OF ARTERIOVENOUS  FISTULA Right 10/01/2019   Procedure: LIGATION OF RIGHT RADIOCEPHALIC  ARTERIOVENOUS  FISTULA;  Surgeon: Angelia Mould, MD;  Location: [facility];  Service: Vascular;  Laterality: Right;  . PERIPHERAL VASCULAR BALLOON ANGIOPLASTY Right 02/12/2020   Procedure: PERIPHERAL VASCULAR BALLOON ANGIOPLASTY;  Surgeon: Serafina Mitchell, MD;  Location: [facility];  Service: Cardiovascular;  Laterality: Right;  peroneal  . PERIPHERAL VASCULAR INTERVENTION Right 02/12/2020   Procedure: PERIPHERAL VASCULAR INTERVENTION;  Surgeon: Serafina Mitchell, MD;  Location: [facility];  Service: Cardiovascular;  Laterality: Right;  fem/pop bypass graft  . TOE AMPUTATION  2010   left 2nd toe  . UPPER GI ENDOSCOPY     Social History   Occupational History  . Not on file  Tobacco Use  . Smoking status: Former  Smoker    Types: Cigarettes    Quit date: 03/11/1996    Years since quitting: 24.0  . Smokeless tobacco: Never Used  Vaping Use  . Vaping Use: Never used  Substance and Sexual Activity  . Alcohol use: No  . Drug use: No  . Sexual activity: Not Currently

## 2020-03-11 ENCOUNTER — Other Ambulatory Visit: Payer: Self-pay

## 2020-03-11 DIAGNOSIS — I5043 Acute on chronic combined systolic (congestive) and diastolic (congestive) heart failure: Secondary | ICD-10-CM | POA: Insufficient documentation

## 2020-03-11 DIAGNOSIS — F5101 Primary insomnia: Secondary | ICD-10-CM | POA: Insufficient documentation

## 2020-03-11 DIAGNOSIS — Z992 Dependence on renal dialysis: Secondary | ICD-10-CM | POA: Insufficient documentation

## 2020-03-11 DIAGNOSIS — L89152 Pressure ulcer of sacral region, stage 2: Secondary | ICD-10-CM | POA: Insufficient documentation

## 2020-03-11 DIAGNOSIS — I739 Peripheral vascular disease, unspecified: Secondary | ICD-10-CM

## 2020-03-11 DIAGNOSIS — L97511 Non-pressure chronic ulcer of other part of right foot limited to breakdown of skin: Secondary | ICD-10-CM | POA: Insufficient documentation

## 2020-03-11 DIAGNOSIS — F329 Major depressive disorder, single episode, unspecified: Secondary | ICD-10-CM | POA: Insufficient documentation

## 2020-03-11 DIAGNOSIS — N186 End stage renal disease: Secondary | ICD-10-CM | POA: Insufficient documentation

## 2020-03-14 ENCOUNTER — Telehealth: Payer: Self-pay

## 2020-03-14 ENCOUNTER — Other Ambulatory Visit: Payer: Self-pay | Admitting: Oncology

## 2020-03-14 DIAGNOSIS — E538 Deficiency of other specified B group vitamins: Secondary | ICD-10-CM

## 2020-03-14 DIAGNOSIS — C9110 Chronic lymphocytic leukemia of B-cell type not having achieved remission: Secondary | ICD-10-CM

## 2020-03-14 DIAGNOSIS — D649 Anemia, unspecified: Secondary | ICD-10-CM

## 2020-03-14 NOTE — Telephone Encounter (Signed)
Ivar Drape from [facility] called she stated the patient is being discharged today and she is requesting clinical/office notes from patients last visit which was 2/28 call back:[phone] Fax:[phone]

## 2020-03-14 NOTE — Telephone Encounter (Signed)
This was faxed today 

## 2020-03-17 ENCOUNTER — Encounter: Payer: Self-pay | Admitting: Orthopedic Surgery

## 2020-03-17 ENCOUNTER — Ambulatory Visit (INDEPENDENT_AMBULATORY_CARE_PROVIDER_SITE_OTHER): Payer: Medicare Other | Admitting: Physician Assistant

## 2020-03-17 DIAGNOSIS — L03115 Cellulitis of right lower limb: Secondary | ICD-10-CM

## 2020-03-17 NOTE — Progress Notes (Signed)
Office Visit Note   Patient: Thomas Mcguire           Date of Birth: [birth year]           MRN: [id number] Visit Date: 03/17/2020              Requested by: Leanna Battles, [place],  [place] [id number] PCP: Leanna Battles, MD  Chief Complaint  Patient presents with  . 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  He was recently discharged from a nursing facility is now at his home with home care.  He has no complaints.  Assessment & Plan: Visit Diagnoses: No diagnosis found.  Plan: Patient will be doing daily cleansing with Dial soap and water.  Has been provided a prescription to work with Museum/gallery curator for an appropriate orthotic and extra-depth shoes with a carbon plate.  We will follow-up in 2 weeks  Follow-Up Instructions: No follow-ups on file.   Ortho Exam  Patient is alert, oriented, no adenopathy, well-dressed, normal affect, normal respiratory effort. Examination quite a bit of eschar at the end of the incision with sutures still remaining in place.  These were removed without difficulty.  He has mild to moderate soft tissue swelling but no erythema.  No purulence no fluctuance only bloody drainage from where the sutures were removed.  No ascending cellulitis  Imaging: No results found. No images are attached to the encounter.  Labs: Lab Results  Component Value Date   HGBA1C 6.1 (H) 02/11/2020   HGBA1C 6.2 (H) 11/07/2019   HGBA1C 6.8 (H) 07/11/2018   CRP <0.8 10/14/2017   LABURIC 5.8 11/02/2017   REPTSTATUS 02/18/2020 FINAL 02/12/2020   REPTSTATUS 02/18/2020 FINAL 02/12/2020   CULT  02/12/2020    NO GROWTH 5 DAYS Performed at [facility], [place] [address] [place], [place] [address]    CULT  02/12/2020    NO GROWTH 5 DAYS Performed at [facility] [address], [place], [place] [address]      Lab  Results  Component Value Date   ALBUMIN 2.6 (L) 02/20/2020   ALBUMIN 2.9 (L) 02/19/2020   ALBUMIN 2.5 (L) 02/18/2020   LABURIC 5.8 11/02/2017    Lab Results  Component Value Date   MG 2.6 (H) 02/20/2020   MG 2.7 (H) 02/19/2020   MG 2.1 11/11/2019   No results found for: VD25OH  No results found for: PREALBUMIN CBC EXTENDED Latest Ref Rng & Units 02/20/2020 02/19/2020 02/18/2020  WBC 4.0 - 10.5 K/uL 12.8(H) 15.8(H) 14.4(H)  RBC 4.22 - 5.81 MIL/uL 3.47(L) 3.88(L) 3.80(L)  HGB 13.0 - 17.0 g/dL 9.5(L) 10.7(L) 10.0(L)  HCT 39.0 - 52.0 % 30.1(L) 33.4(L) 33.5(L)  PLT 150 - 400 K/uL 144(L) 143(L) 122(L)  NEUTROABS 1.7 - 7.7 K/uL 4.0 4.6 4.2  LYMPHSABS 0.7 - 4.0 K/uL 7.3(H) 9.4(H) 9.8(H)     There is no height or weight on file to calculate BMI.  Orders:  No orders of the defined types were placed in this encounter.  No orders of the defined types were placed in this encounter.    Procedures: No procedures performed  Clinical Data: No additional findings.  ROS:  All other systems negative, except as noted in the HPI. Review of Systems  Objective: Vital Signs: There were no vitals taken for this visit.  Specialty Comments:  No specialty comments available.  PMFS History: Patient Active Problem List   Diagnosis Date Noted  . Acute on chronic combined systolic and diastolic heart failure ([place]) 03/11/2020  . End stage renal disease ([place]) 03/11/2020  . Dependence on renal dialysis ([place]) 03/11/2020  . Non-pressure chronic ulcer of other part of right foot limited to breakdown of skin ([place]) 03/11/2020  . Pressure injury of sacral region, stage 2 ([place]) 03/11/2020  . Primary insomnia 03/11/2020  . Reactive depression 03/11/2020  . Necrotic toes ([place]) 02/11/2020  . Cellulitis and abscess of toe of right foot 02/10/2020  . Cellulitis of right foot 02/10/2020  . Bilateral hearing loss 11/26/2019  . Excessive cerumen in both ear canals 11/26/2019  . Fluid overload 11/07/2019   . Cardiomegaly 11/07/2019  . DM2 (diabetes mellitus, type 2) ([place]) 11/07/2019  . Heart failure ([place]) 12/15/2018  . CKD (chronic kidney disease) 10/06/2018  . Amputation of great toe ([place]) 08/14/2018  . PVD (peripheral vascular disease) ([place]) 07/11/2018  . Degeneration of thoracic intervertebral disc 03/09/2018  . Pain in thoracic spine 03/09/2018  . B12 deficiency 11/30/2017  . Folic acid deficiency 44/01/270  . 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 Shortness of breath 11/07/2017  . Anemia in chronic kidney disease 10/12/2017  . Cellulitis of right lower limb 08/01/2017  . 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 ([place]) 08/01/2017  . Pain in right ankle and joints of right foot 11/08/2016  . Peripheral venous insufficiency 07/26/2016  . Encounter for general adult medical examination without abnormal findings 04/14/2015  . 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[place]) 04/26/2014  . Obesity 08/26/2010  . 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 Essential hypertension 11/21/2008  . Gout 11/21/2008  . Hyperlipidemia 11/21/2008   Past Medical History:  Diagnosis Date  . Anemia    low iron  . Arthritis   . Chronic renal insufficiency    CHECKED Q3MOS PER PT. Stage 4 as of 05/22/2018 per pt.  . Diabetes mellitus    type 2  . ESRD (end stage renal disease) ([place])   . Gout    Has not had recently- 08/24/11  . Hyperlipidemia   . Hypertension   . 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 PAD (peripheral artery disease) ([place])   . Wears dentures     Family History  Problem Relation Age of Onset  . Colon cancer Neg Hx     Past Surgical History:  Procedure Laterality Date  . A/V FISTULAGRAM N/A 08/11/2018   Procedure: A/V FISTULAGRAM - Right Arm;  Surgeon: Angelia Mould, MD;  Location: [facility];  Service: Cardiovascular;  Laterality: N/A;  . A/V FISTULAGRAM N/A 09/07/2019   Procedure: A/V  FISTULAGRAM - Right AVF;  Surgeon: Angelia Mould, MD;  Location: [facility];  Service: Cardiovascular;  Laterality: N/A;  . ABDOMINAL AORTOGRAM W/LOWER EXTREMITY Right 07/03/2018   Procedure: ABDOMINAL AORTOGRAM W/LOWER EXTREMITY;  Surgeon: Waynetta Sandy, MD;  Location: [facility];  Service: Cardiovascular;  Laterality: Right;  . ABDOMINAL AORTOGRAM W/LOWER EXTREMITY N/A 02/12/2020   Procedure: ABDOMINAL AORTOGRAM W/LOWER EXTREMITY;  Surgeon: Serafina Mitchell, MD;  Location: [facility];  Service: Cardiovascular;  Laterality: N/A;  . AMPUTATION  08/27/2011   Procedure: AMPUTATION DIGIT;  Surgeon: Newt Minion, MD;  Location: [facility];  Service: Orthopedics;  Laterality: Left;  Left Foot 3rd toe Amputation MTP joint  . AMPUTATION  Right 07/11/2018   Procedure: AMPUTATION RIGHT GREAT TOE;  Surgeon: Angelia Mould, MD;  Location: [facility];  Service: Vascular;  Laterality: Right;  . AMPUTATION Right 02/15/2020   Procedure: TRANSMETATARSAL AMPUTATION;  Surgeon: Newt Minion, MD;  Location: [facility];  Service: Orthopedics;  Laterality: Right;  . amputation great toe  2009   left  . AV FISTULA PLACEMENT Right 05/23/2018   Procedure: RADIAL- CEPHALIC ARTERIOVENOUS (AV) FISTULA CREATION RIGHT ARM AND LIGATION OF COMPETING BRANCH.;  Surgeon: Angelia Mould, MD;  Location: [facility];  Service: Vascular;  Laterality: Right;  . BASCILIC VEIN TRANSPOSITION Right 10/01/2019   Procedure: FIRST STAGE BASCILIC VEIN TRANSPOSITION;  Surgeon: Angelia Mould, MD;  Location: [facility];  Service: Vascular;  Laterality: Right;  . BASCILIC VEIN TRANSPOSITION Right 11/13/2019   Procedure: RIGHT SECOND STAGE BASCILIC VEIN TRANSPOSITION;  Surgeon: Rosetta Posner, MD;  Location: [facility];  Service: Vascular;  Laterality: Right;  . COLONOSCOPY    . cyst elbow  1999   right  . FEMORAL-POPLITEAL BYPASS GRAFT Right 07/11/2018   Procedure: BYPASS GRAFT FEMORAL-POPLITEAL ARTERY;  Surgeon:  Angelia Mould, MD;  Location: [facility];  Service: Vascular;  Laterality: Right;  . FOOT SURGERY     both feet for something that was torn  . IR FLUORO GUIDE CV LINE RIGHT  11/08/2019  . IR US GUIDE VASC ACCESS RIGHT  11/08/2019  . LIGATION OF ARTERIOVENOUS  FISTULA Right 10/01/2019   Procedure: LIGATION OF RIGHT RADIOCEPHALIC  ARTERIOVENOUS  FISTULA;  Surgeon: Angelia Mould, MD;  Location: [facility];  Service: Vascular;  Laterality: Right;  . PERIPHERAL VASCULAR BALLOON ANGIOPLASTY Right 02/12/2020   Procedure: PERIPHERAL VASCULAR BALLOON ANGIOPLASTY;  Surgeon: Serafina Mitchell, MD;  Location: [facility];  Service: Cardiovascular;  Laterality: Right;  peroneal  . PERIPHERAL VASCULAR INTERVENTION Right 02/12/2020   Procedure: PERIPHERAL VASCULAR INTERVENTION;  Surgeon: Serafina Mitchell, MD;  Location: [facility];  Service: Cardiovascular;  Laterality: Right;  fem/pop bypass graft  . TOE AMPUTATION  2010   left 2nd toe  . UPPER GI ENDOSCOPY     Social History   Occupational History  . Not on file  Tobacco Use  . Smoking status: Former Smoker    Types: Cigarettes    Quit date: 03/11/1996    Years since quitting: 24.0  . Smokeless tobacco: Never Used  Vaping Use  . Vaping Use: Never used  Substance and Sexual Activity  . Alcohol use: No  . Drug use: No  . Sexual activity: Not Currently

## 2020-03-19 ENCOUNTER — Ambulatory Visit (INDEPENDENT_AMBULATORY_CARE_PROVIDER_SITE_OTHER): Payer: Medicare Other | Admitting: Physician Assistant

## 2020-03-19 ENCOUNTER — Ambulatory Visit (HOSPITAL_COMMUNITY)
Admission: RE | Admit: 2020-03-19 | Discharge: 2020-03-19 | Disposition: A | Discharge status: Home / Self Care | Payer: Medicare Other | Source: Ambulatory Visit | Attending: Vascular Surgery | Admitting: Vascular Surgery

## 2020-03-19 ENCOUNTER — Other Ambulatory Visit: Payer: Self-pay

## 2020-03-19 ENCOUNTER — Ambulatory Visit (INDEPENDENT_AMBULATORY_CARE_PROVIDER_SITE_OTHER)
Admit: 2020-03-19 | Discharge: 2020-03-19 | Disposition: A | Discharge status: Home / Self Care | Payer: Medicare Other | Attending: Vascular Surgery | Admitting: Vascular Surgery

## 2020-03-19 VITALS — BP 108/56 | HR 86 | Temp 97.6°F | Resp 20 | Ht 79.0 in | Wt 198.0 lb

## 2020-03-19 DIAGNOSIS — I739 Peripheral vascular disease, unspecified: Secondary | ICD-10-CM

## 2020-03-19 NOTE — Progress Notes (Signed)
History of Present Illness:  Patient is a 80 y.o. year old male who presents for evaluation of PAD. He is s/p angiogram on 02/12/20 by Dr. Trula Slade who placed stents beginning in the tibioperoneal trunk extending up into the bypass graft.  I then placed 2 overlapping 5x250 Viabahn stents within the vein graft. and angioplasty.  He was placed on ASA and Plavix.    Dr. Sharol Given then performed right TMA.  He was discharged to a SNF.  He has bleeding episodes and Dr. Sharol Given stopped the Plavix, but continued ASA.  He is now home.  The TMA has healed well.  He denise fever and chills.  He does have dependent edema which improves with elevation and after sleep at night.   Past Medical History:  Diagnosis Date  . Anemia    low iron  . Arthritis   . Chronic renal insufficiency    CHECKED Q3MOS PER PT. Stage 4 as of 05/22/2018 per pt.  . Diabetes mellitus    type 2  . ESRD (end stage renal disease) ([place])   . Gout    Has not had recently- 08/24/11  . Hyperlipidemia   . Hypertension   . 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 PAD (peripheral artery disease) ([place])   . Wears dentures     Past Surgical History:  Procedure Laterality Date  . A/V FISTULAGRAM N/A 08/11/2018   Procedure: A/V FISTULAGRAM - Right Arm;  Surgeon: Angelia Mould, MD;  Location: [facility];  Service: Cardiovascular;  Laterality: N/A;  . A/V FISTULAGRAM N/A 09/07/2019   Procedure: A/V FISTULAGRAM - Right AVF;  Surgeon: Angelia Mould, MD;  Location: [facility];  Service: Cardiovascular;  Laterality: N/A;  . ABDOMINAL AORTOGRAM W/LOWER EXTREMITY Right 07/03/2018   Procedure: ABDOMINAL AORTOGRAM W/LOWER EXTREMITY;  Surgeon: Waynetta [facility], MD;  Location: [facility];  Service: Cardiovascular;  Laterality: Right;  . ABDOMINAL AORTOGRAM W/LOWER EXTREMITY N/A 02/12/2020   Procedure: ABDOMINAL AORTOGRAM W/LOWER EXTREMITY;  Surgeon: Serafina [facility], MD;  Location: [facility];  Service: Cardiovascular;  Laterality: N/A;  . AMPUTATION  08/27/2011   Procedure: AMPUTATION DIGIT;  Surgeon: Newt Minion, MD;  Location: [facility];  Service: Orthopedics;  Laterality: Left;  Left Foot 3rd toe Amputation MTP joint  . AMPUTATION Right 07/11/2018   Procedure: AMPUTATION RIGHT GREAT TOE;  Surgeon: Angelia Mould, MD;  Location: [facility];  Service: Vascular;  Laterality: Right;  . AMPUTATION Right 02/15/2020   Procedure: TRANSMETATARSAL AMPUTATION;  Surgeon: Newt Minion, MD;  Location: [place];  Service: Orthopedics;  Laterality: Right;  . amputation great toe  2009   left  . AV FISTULA PLACEMENT Right 05/23/2018   Procedure: RADIAL- CEPHALIC ARTERIOVENOUS (AV) FISTULA CREATION RIGHT ARM AND LIGATION OF COMPETING BRANCH.;  Surgeon: Angelia Mould, MD;  Location: [facility];  Service: Vascular;  Laterality: Right;  . BASCILIC VEIN TRANSPOSITION Right 10/01/2019   Procedure: FIRST STAGE BASCILIC VEIN TRANSPOSITION;  Surgeon: Angelia Mould, MD;  Location: [name];  Service: Vascular;  Laterality: Right;  . BASCILIC VEIN TRANSPOSITION Right 11/13/2019   Procedure: RIGHT SECOND STAGE BASCILIC VEIN TRANSPOSITION;  Surgeon: Rosetta Posner, MD;  Location: [facility];  Service: Vascular;  Laterality: Right;  . COLONOSCOPY    . cyst elbow  1999   right  . FEMORAL-POPLITEAL BYPASS GRAFT Right 07/11/2018   Procedure: BYPASS GRAFT FEMORAL-POPLITEAL ARTERY;  Surgeon: Angelia Mould, MD;  Location: [facility];  Service: Vascular;  Laterality: Right;  . FOOT SURGERY     both feet for something that was torn  . IR FLUORO GUIDE CV LINE RIGHT  11/08/2019  . IR US GUIDE VASC ACCESS RIGHT  11/08/2019  . LIGATION OF ARTERIOVENOUS  FISTULA Right 10/01/2019   Procedure: LIGATION OF RIGHT RADIOCEPHALIC  ARTERIOVENOUS  FISTULA;  Surgeon: Angelia Mould, MD;  Location: [facility];  Service: Vascular;  Laterality: Right;  . PERIPHERAL VASCULAR BALLOON ANGIOPLASTY Right 02/12/2020    Procedure: PERIPHERAL VASCULAR BALLOON ANGIOPLASTY;  Surgeon: Serafina [facility], MD;  Location: [place] [facility];  Service: Cardiovascular;  Laterality: Right;  peroneal  . PERIPHERAL VASCULAR INTERVENTION Right 02/12/2020   Procedure: PERIPHERAL VASCULAR INTERVENTION;  Surgeon: Serafina [facility], MD;  Location: [facility];  Service: Cardiovascular;  Laterality: Right;  fem/pop bypass graft  . TOE AMPUTATION  2010   left 2nd toe  . UPPER GI ENDOSCOPY      ROS:   General:  No weight loss, Fever, chills  HEENT: No recent headaches, no nasal bleeding, no visual changes, no sore throat  Neurologic: No dizziness, blackouts, seizures. No recent symptoms of stroke or mini- stroke. No recent episodes of slurred speech, or temporary blindness.  Cardiac: No recent episodes of chest pain/pressure, no shortness of breath at rest.  No shortness of breath with exertion.  Denies history of atrial fibrillation or irregular heartbeat  Vascular: No history of rest pain in feet.  No history of claudication.  No history of non-healing ulcer, No history of DVT   Pulmonary: No home oxygen, no productive cough, no hemoptysis,  No asthma or wheezing  Musculoskeletal:  _0  Arthritis, _1  Low back pain,  [x ] Joint pain  Hematologic:No history of hypercoagulable state.  No history of easy bleeding.  No history of anemia  Gastrointestinal: No hematochezia or melena,  No gastroesophageal reflux, no trouble swallowing  Urinary: [x ] chronic Kidney disease, _2  on HD - _3  MWF or _4  TTHS, _5  Burning with urination, _6  Frequent urination, _7  Difficulty urinating;   Skin: No rashes  Psychological: No history of anxiety,  No history of depression  Social History Social History   Tobacco Use  . Smoking status: Former Smoker    Types: Cigarettes    Quit date: 03/11/1996    Years since quitting: 24.0  . Smokeless tobacco: Never Used  Vaping Use  . Vaping Use: Never used  Substance Use Topics  .  Alcohol use: No  . Drug use: No    Family History Family History  Problem Relation Age of Onset  . Colon cancer Neg Hx     Allergies  No Known Allergies   Current Outpatient Medications  Medication Sig Dispense Refill  . Accu-Chek FastClix Lancets MISC Use one lancet 5 times every day  DX:  250.00    . aspirin EC 81 MG EC tablet Take 1 tablet (81 mg total) by mouth daily. Swallow whole. 30 tablet 11  . B Complex-C-Folic Acid (NEPHRO-VITE RX PO) Take 1 tablet by mouth at bedtime.    . Blood Glucose Calibration (ACCU-CHEK AVIVA) SOLN U.A.D TO CALIBRATE METER    . Blood Glucose Monitoring Suppl (ACCU-CHEK AVIVA PLUS) w/Device KIT Use meter to check blood sugar    . calcitRIOL (ROCALTROL) 0.25 MCG capsule Take 0.25 mcg by mouth every Monday, Wednesday, and Friday.  5  . clopidogrel (PLAVIX) 75 MG tablet Take 1 tablet (75 mg total) by mouth daily.    . cyanocobalamin (,VITAMIN B-12,) 1000 MCG/ML injection Vitamin B12 injection: 1000 mcg (1 mL) by subcu injection once daily for 7 days; then once weekly for 4 weeks; then once monthly thereafter.  7 07/15/2010 (Patient taking differently: Inject 1,000 mcg into the muscle every 30 (thirty) days.) 12 mL 1  . folic acid (FOLVITE) 1 MG tablet TAKE 1 TABLET BY MOUTH EVERY DAY 90 tablet 1  . glucose blood (ACCU-CHEK AVIVA PLUS) test strip Check blood sugar 5 times daily (dx E11.49)    . insulin NPH Human (NOVOLIN N) 100 UNIT/ML injection Inject 0.5 mLs (50 Units total) into the skin daily before breakfast. (Patient taking differently: Inject 50 Units into the skin daily as needed (high blood sugar).) 10 mL 11  . Insulin Syringe-Needle U-100 31G X 5/16" 0.5 ML MISC Use one new syringe 6 times daily for insulin DX E11.49    . Lancets Misc. (ACCU-CHEK FASTCLIX LANCET) KIT u.a.d to test BS 5 times daily    . melatonin 1 MG TABS tablet Take 3 mg by mouth at bedtime as needed (sleep).    . midodrine (PROAMATINE) 10 MG tablet Take 10 mg by mouth 3 (three)  times a week. Tues, Thurs, Sat    . oxyCODONE (OXY IR/ROXICODONE) 5 MG immediate release tablet Take 1-2 tablets (5-10 mg total) by mouth every 4 (four) hours as needed for moderate pain (pain score 4-6). 15 tablet 0  . pravastatin (PRAVACHOL) 40 MG tablet Take 40 mg by mouth at bedtime.    . sucroferric oxyhydroxide (VELPHORO) 500 MG chewable tablet Chew 1 tablet (500 mg total) by mouth 3 (three) times daily with meals. 90 tablet   . traZODone (DESYREL) 50 MG tablet Take 50 mg by mouth at bedtime.     No current facility-administered medications for this visit.    Physical Examination  Vitals:   03/19/20 1519  BP: (!) 108/56  Pulse: 86  Resp: 20  Temp: 97.6 F (36.4 C)  TempSrc: Temporal  SpO2: 100%  Weight: 198 lb (89.8 kg)  Height: _0  (2.007 m)    Body mass index is 22.31 kg/m.  General:  Alert and oriented, no acute distress HEENT: Normal Neck: No bruit or JVD Pulmonary: Clear to auscultation bilaterally Cardiac: Regular Rate and Rhythm without murmur Abdomen: Soft, non-tender, non-distended, no mass, no scars Skin: No rash Well healed TMA Musculoskeletal: No deformity or edema  Neurologic: Upper and lower extremity motor  and symmetric  DATA:  +-----------+--------+-----+--------+---------+--------+  RIGHT   PSV cm/sRatioStenosisWaveform Comments  +-----------+--------+-----+--------+---------+--------+  CFA Prox  102          triphasic      +-----------+--------+-----+--------+---------+--------+  CFA Distal 116          triphasic      +-----------+--------+-----+--------+---------+--------+  DFA    82          biphasic       +-----------+--------+-----+--------+---------+--------+  POP Prox  119          biphasic       +-----------+--------+-----+--------+---------+--------+  POP Distal 98          biphasic        +-----------+--------+-----+--------+---------+--------+  ATA Distal 90          biphasic       +-----------+--------+-----+--------+---------+--------+  PTA Distal 68          biphasic       +-----------+--------+-----+--------+---------+--------+  PERO [id number]          biphasic       +-----------+--------+-----+--------+---------+--------+       Right Graft #1: Femoral- BK popliteal  +------------------+--------+--------+--------+--------+           PSV cm/sStenosisWaveformComments  +------------------+--------+--------+--------+--------+  Inflow      104       biphasic      +------------------+--------+--------+--------+--------+  Prox Anastomosis 146       biphasic      +------------------+--------+--------+--------+--------+  Proximal Graft  114       biphasic      +------------------+--------+--------+--------+--------+  Mid Graft     56       biphasic      +------------------+--------+--------+--------+--------+  Distal Graft   47       biphasic      +------------------+--------+--------+--------+--------+  Distal Anastomosis98       biphasic      +------------------+--------+--------+--------+--------+  Outflow      109       biphasic      +------------------+--------+--------+--------+--------+   Summary:  Right: Patent femoral-popliteal bypass graft without evidence of  restenosis.     ABI Findings:  +--------+------------------+-----+--------+--------+  Right  Rt Pressure (mmHg)IndexWaveformComment   +--------+------------------+-----+--------+--------+  Brachial                Fistula   +--------+------------------+-----+--------+--------+  PTA   230        2.02 biphasic       +--------+------------------+-----+--------+--------+  DP   230        2.02 biphasic      +--------+------------------+-----+--------+--------+   +--------+------------------+-----+----------+-------+  Left  Lt Pressure (mmHg)IndexWaveform Comment  +--------+------------------+-----+----------+-------+  [id number]                      +--------+------------------+-----+----------+-------+  PTA   230        2.02 monophasic      +--------+------------------+-----+----------+-------+  DP   230        2.02 monophasic      +--------+------------------+-----+----------+-------+   +-------+-----------+-----------+------------+------------+  ABI/TBIToday's ABIToday's TBIPrevious ABIPrevious TBI  +-------+-----------+-----------+------------+------------+  Right [place]     Amputated [place]     Amputated    +-------+-----------+-----------+------------+------------+  Left  [place]     Amputated [place]     Amputated    +-------+-----------+-----------+------------+------------+    ASSESSMENT:  PAD s/p Occluded Fem -pop after angiogram intervention  Successful recanalization of the right femoral below-knee popliteal artery bypass graft.  A 5 x 100 INNOVA stent was placed beginning in the tibioperoneal trunk extending up into the bypass graft.  I then placed 2 overlapping 5x250 Viabahn stents within the vein graft.    S/P TMA healed  PLAN: He will increase activity as tolerates.  Plan for f/u 9 months with repeat duplex and ABI.  Elevation of LE when at rest.  Call the office if he develops new complaints or if the TMA does not appear viable.   Roxy Horseman PA-C [facility] Office: [phone]  MD in office [place]

## 2020-03-20 ENCOUNTER — Encounter: Payer: Self-pay | Admitting: Physician Assistant

## 2020-03-20 ENCOUNTER — Other Ambulatory Visit: Payer: Self-pay

## 2020-03-20 DIAGNOSIS — I739 Peripheral vascular disease, unspecified: Secondary | ICD-10-CM

## 2020-03-31 ENCOUNTER — Ambulatory Visit (INDEPENDENT_AMBULATORY_CARE_PROVIDER_SITE_OTHER): Payer: Medicare Other | Admitting: Orthopedic Surgery

## 2020-03-31 ENCOUNTER — Encounter: Payer: Self-pay | Admitting: Orthopedic Surgery

## 2020-03-31 DIAGNOSIS — Z89431 Acquired absence of right foot: Secondary | ICD-10-CM

## 2020-03-31 DIAGNOSIS — Z89432 Acquired absence of left foot: Secondary | ICD-10-CM

## 2020-03-31 NOTE — Progress Notes (Signed)
Office Visit Note   Patient: Thomas Mcguire           Date of Birth: November 26, 1940           MRN: [id number] Visit Date: 03/31/2020              Requested by: Leanna Battles, [place],  [place] [address] PCP: Leanna Battles, MD  Chief Complaint  Patient presents with  . Right Foot - Follow-up, Pain      HPI: Patient is a 80 year old gentleman who presents 6 weeks status post right transmetatarsal amputation patient is currently in a [place] shoe he denies any pain.  Assessment & Plan: Visit Diagnoses:  1. History of transmetatarsal amputation of left foot ([place])     Plan: Patient will continue with the postoperative shoe will follow-up with [facility] in 2 weeks to be fit for shoe spacer and 3 extra-large compression sock.  Follow-Up Instructions: Return in about 4 weeks (around 04/28/2020).   Ortho Exam  Patient is alert, oriented, no adenopathy, well-dressed, normal affect, normal respiratory effort. Examination there is increased callus along the surgical incision the callus was removed there is healthy granulation tissue along the wound margins no drainage no cellulitis no signs of infection.  Imaging: No results found. No images are attached to the encounter.  Labs: Lab Results  Component Value Date   HGBA1C 6.1 (H) 02/11/2020   HGBA1C 6.2 (H) 11/07/2019   HGBA1C 6.8 (H) 07/11/2018   CRP <0.8 10/14/2017   LABURIC 5.8 11/02/2017   REPTSTATUS 02/18/2020 FINAL 02/12/2020   REPTSTATUS 02/18/2020 FINAL 02/12/2020   CULT  02/12/2020    NO GROWTH 5 DAYS Performed at [facility], [place] [address], [place], [place] [address]    CULT  02/12/2020    NO GROWTH 5 DAYS Performed at [facility] [address], [place], [place] [address]      Lab Results  Component Value Date   ALBUMIN 2.6 (L) 02/20/2020   ALBUMIN 2.9 (L) 02/19/2020   ALBUMIN 2.5 (L) 02/18/2020    Lab Results  Component Value Date   MG 2.6 (H) 02/20/2020   MG 2.7  (H) 02/19/2020   MG 2.1 11/11/2019   No results found for: VD25OH  No results found for: PREALBUMIN CBC EXTENDED Latest Ref Rng & Units 02/20/2020 02/19/2020 02/18/2020  WBC 4.0 - 10.5 K/uL 12.8(H) 15.8(H) 14.4(H)  RBC 4.22 - 5.81 MIL/uL 3.47(L) 3.88(L) 3.80(L)  HGB 13.0 - 17.0 g/dL 9.5(L) 10.7(L) 10.0(L)  HCT 39.0 - 52.0 % 30.1(L) 33.4(L) 33.5(L)  PLT 150 - 400 K/uL 144(L) 143(L) 122(L)  NEUTROABS 1.7 - 7.7 K/uL 4.0 4.6 4.2  LYMPHSABS 0.7 - 4.0 K/uL 7.3(H) 9.4(H) 9.8(H)     There is no height or weight on file to calculate BMI.  Orders:  No orders of the defined types were placed in this encounter.  No orders of the defined types were placed in this encounter.    Procedures: No procedures performed  Clinical Data: No additional findings.  ROS:  All other systems negative, except as noted in the HPI. Review of Systems  Objective: Vital Signs: There were no vitals taken for this visit.  Specialty Comments:  No specialty comments available.  PMFS History: Patient Active Problem List   Diagnosis Date Noted  . Acute on chronic combined systolic and diastolic heart failure ([place]) 03/11/2020  . End stage renal disease ([place]) 03/11/2020  . Dependence on renal dialysis ([place]) 03/11/2020  .  Non-pressure chronic ulcer of other part of right foot limited to breakdown of skin ([place]) 03/11/2020  . Pressure injury of sacral region, stage 2 ([place]) 03/11/2020  . Primary insomnia 03/11/2020  . Reactive depression 03/11/2020  . Necrotic toes ([place]) 02/11/2020  . Cellulitis and abscess of toe of right foot 02/10/2020  . Cellulitis of right foot 02/10/2020  . Bilateral hearing loss 11/26/2019  . Excessive cerumen in both ear canals 11/26/2019  . Fluid overload 11/07/2019  . Cardiomegaly 11/07/2019  . DM2 (diabetes mellitus, type 2) ([place]) 11/07/2019  . Heart failure ([place]) 12/15/2018  . CKD (chronic kidney disease) 10/06/2018  . Amputation of great toe ([place]) 08/14/2018  . PVD (peripheral  vascular disease) ([place]) 07/11/2018  . Degeneration of thoracic intervertebral disc 03/09/2018  . Pain in thoracic spine 03/09/2018  . B12 deficiency 11/30/2017  . Folic acid deficiency 40/97/3532  . 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 Shortness of breath 11/07/2017  . Anemia in chronic kidney disease 10/12/2017  . Cellulitis of right lower limb 08/01/2017  . 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 ([place]) 08/01/2017  . Pain in right ankle and joints of right foot 11/08/2016  . Peripheral venous insufficiency 07/26/2016  . Encounter for general adult medical examination without abnormal findings 04/14/2015  . 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[place]) 04/26/2014  . Obesity 08/26/2010  . 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 Essential hypertension 11/21/2008  . Gout 11/21/2008  . Hyperlipidemia 11/21/2008   Past Medical History:  Diagnosis Date  . Anemia    low iron  . Arthritis   . Chronic renal insufficiency    CHECKED Q3MOS PER PT. Stage 4 as of 05/22/2018 per pt.  . Diabetes mellitus    type 2  . ESRD (end stage renal disease) ([place])   . Gout    Has not had recently- 08/24/11  . Hyperlipidemia   . Hypertension   . 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 PAD (peripheral artery disease) ([place])   . Wears dentures     Family History  Problem Relation Age of Onset  . Colon cancer Neg Hx     Past Surgical History:  Procedure Laterality Date  . A/V FISTULAGRAM N/A 08/11/2018   Procedure: A/V FISTULAGRAM - Right Arm;  Surgeon: Angelia Mould, MD;  Location: [facility];  Service: Cardiovascular;  Laterality: N/A;  . A/V FISTULAGRAM N/A 09/07/2019   Procedure: A/V FISTULAGRAM - Right AVF;  Surgeon: Angelia Mould, MD;  Location: [facility];  Service: Cardiovascular;  Laterality: N/A;  . ABDOMINAL AORTOGRAM W/LOWER EXTREMITY Right 07/03/2018   Procedure: ABDOMINAL  AORTOGRAM W/LOWER EXTREMITY;  Surgeon: Waynetta Sandy, MD;  Location: [facility];  Service: Cardiovascular;  Laterality: Right;  . ABDOMINAL AORTOGRAM W/LOWER EXTREMITY N/A 02/12/2020   Procedure: ABDOMINAL AORTOGRAM W/LOWER EXTREMITY;  Surgeon: Serafina [facility], MD;  Location: [facility];  Service: Cardiovascular;  Laterality: N/A;  . AMPUTATION  08/27/2011   Procedure: AMPUTATION DIGIT;  Surgeon: Newt Minion, MD;  Location: [facility];  Service: Orthopedics;  Laterality: Left;  Left Foot 3rd toe Amputation MTP joint  . AMPUTATION Right 07/11/2018   Procedure: AMPUTATION RIGHT GREAT TOE;  Surgeon: Angelia Mould, MD;  Location: [facility];  Service: Vascular;  Laterality: Right;  . AMPUTATION Right 02/15/2020   Procedure: TRANSMETATARSAL AMPUTATION;  Surgeon: Newt Minion, MD;  Location: [facility];  Service:  Orthopedics;  Laterality: Right;  . amputation great toe  2009   left  . AV FISTULA PLACEMENT Right 05/23/2018   Procedure: RADIAL- CEPHALIC ARTERIOVENOUS (AV) FISTULA CREATION RIGHT ARM AND LIGATION OF COMPETING BRANCH.;  Surgeon: Angelia Mould, MD;  Location: [facility];  Service: Vascular;  Laterality: Right;  . BASCILIC VEIN TRANSPOSITION Right 10/01/2019   Procedure: FIRST STAGE BASCILIC VEIN TRANSPOSITION;  Surgeon: Angelia Mould, MD;  Location: [facility];  Service: Vascular;  Laterality: Right;  . BASCILIC VEIN TRANSPOSITION Right 11/13/2019   Procedure: RIGHT SECOND STAGE BASCILIC VEIN TRANSPOSITION;  Surgeon: Rosetta Posner, MD;  Location: [facility];  Service: Vascular;  Laterality: Right;  . COLONOSCOPY    . cyst elbow  1999   right  . FEMORAL-POPLITEAL BYPASS GRAFT Right 07/11/2018   Procedure: BYPASS GRAFT FEMORAL-POPLITEAL ARTERY;  Surgeon: Angelia Mould, MD;  Location: [facility];  Service: Vascular;  Laterality: Right;  . FOOT SURGERY     both feet for something that was torn  . IR FLUORO GUIDE CV LINE RIGHT  11/08/2019  . IR US GUIDE VASC ACCESS  RIGHT  11/08/2019  . LIGATION OF ARTERIOVENOUS  FISTULA Right 10/01/2019   Procedure: LIGATION OF RIGHT RADIOCEPHALIC  ARTERIOVENOUS  FISTULA;  Surgeon: Angelia Mould, MD;  Location: [facility];  Service: Vascular;  Laterality: Right;  . PERIPHERAL VASCULAR BALLOON ANGIOPLASTY Right 02/12/2020   Procedure: PERIPHERAL VASCULAR BALLOON ANGIOPLASTY;  Surgeon: Serafina [facility], MD;  Location: [facility];  Service: Cardiovascular;  Laterality: Right;  peroneal  . PERIPHERAL VASCULAR INTERVENTION Right 02/12/2020   Procedure: PERIPHERAL VASCULAR INTERVENTION;  Surgeon: Serafina [facility], MD;  Location: [facility];  Service: Cardiovascular;  Laterality: Right;  fem/pop bypass graft  . TOE AMPUTATION  2010   left 2nd toe  . UPPER GI ENDOSCOPY     Social History   Occupational History  . Not on file  Tobacco Use  . Smoking status: Former Smoker    Types: Cigarettes    Quit date: 03/11/1996    Years since quitting: 24.0  . Smokeless tobacco: Never Used  Vaping Use  . Vaping Use: Never used  Substance and Sexual Activity  . Alcohol use: No  . Drug use: No  . Sexual activity: Not Currently

## 2020-04-02 ENCOUNTER — Telehealth: Payer: Self-pay | Admitting: Orthopedic Surgery

## 2020-04-02 NOTE — Telephone Encounter (Signed)
Thomas Mcguire with [facility] called wanting to know if any orders have changed?   Thomas Mcguire CB# [phone]

## 2020-04-02 NOTE — Telephone Encounter (Signed)
Called and sw Linus Orn to advise that the pt was in the office 03/31/20 and was to continue with post op shoe and compression sock and will follow up with [facility] in several weeks for shoe and carbon fiber plate. No wound care. Will call with any question.

## 2020-04-16 ENCOUNTER — Telehealth: Payer: Self-pay

## 2020-04-16 NOTE — Telephone Encounter (Signed)
Advised WBAT

## 2020-04-16 NOTE — Telephone Encounter (Signed)
Stacey from [facility] called regarding patient weight bearing status, call back:[phone]

## 2020-04-21 ENCOUNTER — Telehealth: Payer: Self-pay

## 2020-04-21 NOTE — Telephone Encounter (Signed)
TC from Pt stating he would like to change his appointment date which is 05/29/20 Pt states he has dialysis on Tues and Thurs. Scheduling message sent.

## 2020-04-22 ENCOUNTER — Telehealth: Payer: Self-pay | Admitting: Nurse Practitioner

## 2020-04-22 NOTE — Telephone Encounter (Signed)
Called and advised patient that I had moved his 5/19 appt to 5/20 Friday and he was ok with new date & time per 4/11 sch msg

## 2020-04-28 ENCOUNTER — Ambulatory Visit (INDEPENDENT_AMBULATORY_CARE_PROVIDER_SITE_OTHER): Payer: Medicare Other | Admitting: Physician Assistant

## 2020-04-28 ENCOUNTER — Encounter: Payer: Self-pay | Admitting: Orthopedic Surgery

## 2020-04-28 DIAGNOSIS — Z89432 Acquired absence of left foot: Secondary | ICD-10-CM

## 2020-04-28 NOTE — Progress Notes (Signed)
Office Visit Note   Patient: Thomas Mcguire           Date of Birth: 12/18/1940           MRN: [id number] Visit Date: 04/28/2020              Requested by: Leanna Battles, [place],  [place] [address] PCP: Leanna Battles, MD  Chief Complaint  Patient presents with  . Right Foot - Routine Post Op    02/15/20 right transmet amputation       HPI: Patient presents today 2-1/23-month status post right transmetatarsal amputation.  He has no complaints and is in the process of obtaining issues  Assessment & Plan: Visit Diagnoses: No diagnosis found.  Plan: Patient will follow-up for final visit in 2 months.  Discussed with him placing cocoa butter to soften some of the skin.  Also reminded them to use a compression sock when they can  Follow-Up Instructions: No follow-ups on file.   Ortho Exam  Patient is alert, oriented, no adenopathy, well-dressed, normal affect, normal respiratory effort. Examination well-healed surgical incision has a small eschar on the medial side with some dry skin plantar surface is very well-healed swelling is overall fairly well controlled no ascending cellulitis no signs of infection no drainage  Imaging: No results found. No images are attached to the encounter.  Labs: Lab Results  Component Value Date   HGBA1C 6.1 (H) 02/11/2020   HGBA1C 6.2 (H) 11/07/2019   HGBA1C 6.8 (H) 07/11/2018   CRP <0.8 10/14/2017   LABURIC 5.8 11/02/2017   REPTSTATUS 02/18/2020 FINAL 02/12/2020   REPTSTATUS 02/18/2020 FINAL 02/12/2020   CULT  02/12/2020    NO GROWTH 5 DAYS Performed at [facility], [place] [address], [place], [place] [address]    CULT  02/12/2020    NO GROWTH 5 DAYS Performed at [facility] [address] [place], [place] [address]      Lab Results  Component Value Date   ALBUMIN 2.6 (L) 02/20/2020   ALBUMIN 2.9 (L) 02/19/2020   ALBUMIN 2.5 (L) 02/18/2020    Lab Results  Component Value Date   MG 2.6  (H) 02/20/2020   MG 2.7 (H) 02/19/2020   MG 2.1 11/11/2019   No results found for: VD25OH  No results found for: PREALBUMIN CBC EXTENDED Latest Ref Rng & Units 02/20/2020 02/19/2020 02/18/2020  WBC 4.0 - 10.5 K/uL 12.8(H) 15.8(H) 14.4(H)  RBC 4.22 - 5.81 MIL/uL 3.47(L) 3.88(L) 3.80(L)  HGB 13.0 - 17.0 g/dL 9.5(L) 10.7(L) 10.0(L)  HCT 39.0 - 52.0 % 30.1(L) 33.4(L) 33.5(L)  PLT 150 - 400 K/uL 144(L) 143(L) 122(L)  NEUTROABS 1.7 - 7.7 K/uL 4.0 4.6 4.2  LYMPHSABS 0.7 - 4.0 K/uL 7.3(H) 9.4(H) 9.8(H)     There is no height or weight on file to calculate BMI.  Orders:  No orders of the defined types were placed in this encounter.  No orders of the defined types were placed in this encounter.    Procedures: No procedures performed  Clinical Data: No additional findings.  ROS:  All other systems negative, except as noted in the HPI. Review of Systems  Objective: Vital Signs: There were no vitals taken for this visit.  Specialty Comments:  No specialty comments available.  PMFS History: Patient Active Problem List   Diagnosis Date Noted  . Acute on chronic combined systolic and diastolic heart failure ([place]) 03/11/2020  . End stage renal disease ([place]) 03/11/2020  .  Dependence on renal dialysis ([place]) 03/11/2020  . Non-pressure chronic ulcer of other part of right foot limited to breakdown of skin ([place]) 03/11/2020  . Pressure injury of sacral region, stage 2 ([place]) 03/11/2020  . Primary insomnia 03/11/2020  . Reactive depression 03/11/2020  . Necrotic toes ([place]) 02/11/2020  . Cellulitis and abscess of toe of right foot 02/10/2020  . Cellulitis of right foot 02/10/2020  . Bilateral hearing loss 11/26/2019  . Excessive cerumen in both ear canals 11/26/2019  . Fluid overload 11/07/2019  . Cardiomegaly 11/07/2019  . DM2 (diabetes mellitus, type 2) ([place]) 11/07/2019  . Heart failure ([place]) 12/15/2018  . CKD (chronic kidney disease) 10/06/2018  . Amputation of great toe ([employer])  08/14/2018  . PVD (peripheral vascular disease) ([place]) 07/11/2018  . Degeneration of thoracic intervertebral disc 03/09/2018  . Pain in thoracic spine 03/09/2018  . B12 deficiency 11/30/2017  . Folic acid deficiency 33/29/5188  . 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 Shortness of breath 11/07/2017  . Anemia in chronic kidney disease 10/12/2017  . Cellulitis of right lower limb 08/01/2017  . 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 ([place]) 08/01/2017  . Pain in right ankle and joints of right foot 11/08/2016  . Peripheral venous insufficiency 07/26/2016  . Encounter for general adult medical examination without abnormal findings 04/14/2015  . 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[place]) 04/26/2014  . Obesity 08/26/2010  . 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 Essential hypertension 11/21/2008  . Gout 11/21/2008  . Hyperlipidemia 11/21/2008   Past Medical History:  Diagnosis Date  . Anemia    low iron  . Arthritis   . Chronic renal insufficiency    CHECKED Q3MOS PER PT. Stage 4 as of 05/22/2018 per pt.  . Diabetes mellitus    type 2  . ESRD (end stage renal disease) ([place])   . Gout    Has not had recently- 08/24/11  . Hyperlipidemia   . Hypertension   . 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 PAD (peripheral artery disease) ([place])   . Wears dentures     Family History  Problem Relation Age of Onset  . Colon cancer Neg Hx     Past Surgical History:  Procedure Laterality Date  . A/V FISTULAGRAM N/A 08/11/2018   Procedure: A/V FISTULAGRAM - Right Arm;  Surgeon: Angelia Mould, MD;  Location: [facility];  Service: Cardiovascular;  Laterality: N/A;  . A/V FISTULAGRAM N/A 09/07/2019   Procedure: A/V FISTULAGRAM - Right AVF;  Surgeon: Angelia Mould, MD;  Location: [facility];  Service: Cardiovascular;  Laterality: N/A;  . ABDOMINAL AORTOGRAM W/LOWER EXTREMITY Right 07/03/2018    Procedure: ABDOMINAL AORTOGRAM W/LOWER EXTREMITY;  Surgeon: Waynetta Sandy, MD;  Location: [facility];  Service: Cardiovascular;  Laterality: Right;  . ABDOMINAL AORTOGRAM W/LOWER EXTREMITY N/A 02/12/2020   Procedure: ABDOMINAL AORTOGRAM W/LOWER EXTREMITY;  Surgeon: Serafina Mitchell, MD;  Location: [facility];  Service: Cardiovascular;  Laterality: N/A;  . AMPUTATION  08/27/2011   Procedure: AMPUTATION DIGIT;  Surgeon: Newt Minion, MD;  Location: [facility];  Service: Orthopedics;  Laterality: Left;  Left Foot 3rd toe Amputation MTP joint  . AMPUTATION Right 07/11/2018   Procedure: AMPUTATION RIGHT GREAT TOE;  Surgeon: Angelia Mould, MD;  Location: [facility];  Service: Vascular;  Laterality: Right;  . AMPUTATION Right 02/15/2020   Procedure: TRANSMETATARSAL AMPUTATION;  Surgeon: Meridee Score  V, MD;  Location: [facility];  Service: Orthopedics;  Laterality: Right;  . amputation great toe  2009   left  . AV FISTULA PLACEMENT Right 05/23/2018   Procedure: RADIAL- CEPHALIC ARTERIOVENOUS (AV) FISTULA CREATION RIGHT ARM AND LIGATION OF COMPETING BRANCH.;  Surgeon: Angelia Mould, MD;  Location: [facility];  Service: Vascular;  Laterality: Right;  . BASCILIC VEIN TRANSPOSITION Right 10/01/2019   Procedure: FIRST STAGE BASCILIC VEIN TRANSPOSITION;  Surgeon: Angelia Mould, MD;  Location: [facility];  Service: Vascular;  Laterality: Right;  . BASCILIC VEIN TRANSPOSITION Right 11/13/2019   Procedure: RIGHT SECOND STAGE BASCILIC VEIN TRANSPOSITION;  Surgeon: Rosetta Posner, MD;  Location: [facility];  Service: Vascular;  Laterality: Right;  . COLONOSCOPY    . cyst elbow  1999   right  . FEMORAL-POPLITEAL BYPASS GRAFT Right 07/11/2018   Procedure: BYPASS GRAFT FEMORAL-POPLITEAL ARTERY;  Surgeon: Angelia Mould, MD;  Location: [facility];  Service: Vascular;  Laterality: Right;  . FOOT SURGERY     both feet for something that was torn  . IR FLUORO GUIDE CV LINE RIGHT  11/08/2019  . IR  US GUIDE VASC ACCESS RIGHT  11/08/2019  . LIGATION OF ARTERIOVENOUS  FISTULA Right 10/01/2019   Procedure: LIGATION OF RIGHT RADIOCEPHALIC  ARTERIOVENOUS  FISTULA;  Surgeon: Angelia Mould, MD;  Location: [facility];  Service: Vascular;  Laterality: Right;  . PERIPHERAL VASCULAR BALLOON ANGIOPLASTY Right 02/12/2020   Procedure: PERIPHERAL VASCULAR BALLOON ANGIOPLASTY;  Surgeon: Serafina Mitchell, MD;  Location: [facility];  Service: Cardiovascular;  Laterality: Right;  peroneal  . PERIPHERAL VASCULAR INTERVENTION Right 02/12/2020   Procedure: PERIPHERAL VASCULAR INTERVENTION;  Surgeon: Serafina Mitchell, MD;  Location: [facility];  Service: Cardiovascular;  Laterality: Right;  fem/pop bypass graft  . TOE AMPUTATION  2010   left 2nd toe  . UPPER GI ENDOSCOPY     Social History   Occupational History  . Not on file  Tobacco Use  . Smoking status: Former Smoker    Types: Cigarettes    Quit date: 03/11/1996    Years since quitting: 24.1  . Smokeless tobacco: Never Used  Vaping Use  . Vaping Use: Never used  Substance and Sexual Activity  . Alcohol use: No  . Drug use: No  . Sexual activity: Not Currently

## 2020-05-14 ENCOUNTER — Other Ambulatory Visit: Payer: Self-pay | Admitting: Internal Medicine

## 2020-05-14 ENCOUNTER — Ambulatory Visit
Admission: RE | Admit: 2020-05-14 | Discharge: 2020-05-14 | Disposition: A | Discharge status: Home / Self Care | Payer: Medicare Other | Source: Ambulatory Visit | Attending: Internal Medicine | Admitting: Internal Medicine

## 2020-05-14 DIAGNOSIS — R0600 Dyspnea, unspecified: Secondary | ICD-10-CM

## 2020-05-14 DIAGNOSIS — R0609 Other forms of dyspnea: Secondary | ICD-10-CM

## 2020-05-14 MED ORDER — IOPAMIDOL (ISOVUE-370) INJECTION 76%
75.0000 mL | Freq: Once | INTRAVENOUS | Status: AC | PRN
  Administered 2020-05-14: 75 mL via INTRAVENOUS

## 2020-05-29 ENCOUNTER — Other Ambulatory Visit: Payer: Medicare Other

## 2020-05-29 ENCOUNTER — Ambulatory Visit: Payer: Medicare Other | Admitting: Nurse Practitioner

## 2020-05-30 ENCOUNTER — Other Ambulatory Visit: Payer: Self-pay

## 2020-05-30 ENCOUNTER — Ambulatory Visit: Payer: Medicare Other | Attending: Internal Medicine

## 2020-05-30 ENCOUNTER — Inpatient Hospital Stay: Payer: Medicare Other | Attending: Nurse Practitioner

## 2020-05-30 ENCOUNTER — Other Ambulatory Visit (HOSPITAL_BASED_OUTPATIENT_CLINIC_OR_DEPARTMENT_OTHER): Payer: Self-pay

## 2020-05-30 ENCOUNTER — Inpatient Hospital Stay (HOSPITAL_BASED_OUTPATIENT_CLINIC_OR_DEPARTMENT_OTHER): Payer: Medicare Other | Admitting: Nurse Practitioner

## 2020-05-30 VITALS — BP 106/50 | HR 85 | Temp 97.9°F | Resp 18 | Ht 79.0 in | Wt 170.0 lb

## 2020-05-30 DIAGNOSIS — C9110 Chronic lymphocytic leukemia of B-cell type not having achieved remission: Secondary | ICD-10-CM

## 2020-05-30 DIAGNOSIS — N186 End stage renal disease: Secondary | ICD-10-CM | POA: Diagnosis not present

## 2020-05-30 DIAGNOSIS — D696 Thrombocytopenia, unspecified: Secondary | ICD-10-CM | POA: Diagnosis not present

## 2020-05-30 DIAGNOSIS — I120 Hypertensive chronic kidney disease with stage 5 chronic kidney disease or end stage renal disease: Secondary | ICD-10-CM | POA: Insufficient documentation

## 2020-05-30 DIAGNOSIS — Z23 Encounter for immunization: Secondary | ICD-10-CM

## 2020-05-30 DIAGNOSIS — E1122 Type 2 diabetes mellitus with diabetic chronic kidney disease: Secondary | ICD-10-CM | POA: Insufficient documentation

## 2020-05-30 DIAGNOSIS — D649 Anemia, unspecified: Secondary | ICD-10-CM | POA: Diagnosis not present

## 2020-05-30 LAB — CBC WITH DIFFERENTIAL (CANCER CENTER ONLY)
Abs Immature Granulocytes: 0.07 10*3/uL (ref 0.00–0.07)
Basophils Absolute: 0 10*3/uL (ref 0.0–0.1)
Basophils Relative: 0 %
Eosinophils Absolute: 0.1 10*3/uL (ref 0.0–0.5)
Eosinophils Relative: 1 %
HCT: 38.8 % — ABNORMAL LOW (ref 39.0–52.0)
Hemoglobin: 12.1 g/dL — ABNORMAL LOW (ref 13.0–17.0)
Immature Granulocytes: 1 %
Lymphocytes Relative: 71 %
Lymphs Abs: 7.5 10*3/uL — ABNORMAL HIGH (ref 0.7–4.0)
MCH: 29.9 pg (ref 26.0–34.0)
MCHC: 31.2 g/dL (ref 30.0–36.0)
MCV: 95.8 fL (ref 80.0–100.0)
Monocytes Absolute: 0.6 10*3/uL (ref 0.1–1.0)
Monocytes Relative: 5 %
Neutro Abs: 2.3 10*3/uL (ref 1.7–7.7)
Neutrophils Relative %: 22 %
Platelet Count: 89 10*3/uL — ABNORMAL LOW (ref 150–400)
RBC: 4.05 MIL/uL — ABNORMAL LOW (ref 4.22–5.81)
RDW: 20.5 % — ABNORMAL HIGH (ref 11.5–15.5)
WBC Count: 10.5 10*3/uL (ref 4.0–10.5)
nRBC: 0.2 % (ref 0.0–0.2)

## 2020-05-30 MED ORDER — PFIZER-BIONT COVID-19 VAC-TRIS 30 MCG/0.3ML IM SUSP
INTRAMUSCULAR | 0 refills | Status: AC
  Filled 2020-05-30: qty 0.3, 1d supply, fill #0

## 2020-05-30 NOTE — Progress Notes (Signed)
   Covid-19 Vaccination Clinic  Name:  Thomas Mcguire    MRN: [id number] DOB: 1940-03-17  05/30/2020  [name] was observed post Covid-19 immunization for 15 minutes without incident. He was provided with Vaccine Information Sheet and instruction to access the V-Safe system.   [name] was instructed to call 911 with any severe reactions post vaccine: Marland Kitchen Difficulty breathing  . Swelling of face and throat  . A fast heartbeat  . A bad rash all over body  . Dizziness and weakness   Immunizations Administered    Name Date Dose VIS Date Route   PFIZER Comrnaty(Gray TOP) Covid-19 Vaccine 05/30/2020 12:52 PM 0.3 mL 12/20/2019 Intramuscular   Manufacturer: [place]   Lot: [id number]   NDC: [phone]

## 2020-05-30 NOTE — Progress Notes (Addendum)
  [facility] OFFICE PROGRESS NOTE   Diagnosis:  CLL  INTERVAL HISTORY:   Thomas Mcguire returns for follow-up.  He underwent right transmetatarsal amputation 02/15/2020.  No fevers or sweats.  He has a good appetite.  He has lost weight which he attributes to dialysis.  He reports a good appetite.  He does not think the peripheral lymph nodes have changed.  He has had dyspnea on exertion since beginning dialysis.  No cough or fever.  He continues dialysis on a Tuesday Thursday Saturday schedule.    Objective:  Vital signs in last 24 hours:  Blood pressure (!) 106/50, pulse 85, temperature 97.9 F (36.6 C), temperature source Oral, resp. rate 18, height 6\' 7"  (2.007 m), weight 170 lb (77.1 kg), SpO2 100 %.    Lymphatics:  1 to 2 cm soft mobile bilateral cervical and scalene nodes.  1 cm submental node.  Bilateral axillary, inguinal and femoral nodes, 1 to 3 cm. Resp: Lungs clear bilaterally. Cardio: Regular rate and rhythm. GI: No hepatosplenomegaly. Vascular: 1+ edema lower leg bilaterally. Skin: Healed surgical incision right foot.  Ecchymoses bilateral forearm. Dialysis catheter right chest.  Lab Results:  Lab Results  Component Value Date   WBC 10.5 05/30/2020   HGB 12.1 (L) 05/30/2020   HCT 38.8 (L) 05/30/2020   MCV 95.8 05/30/2020   PLT 89 (L) 05/30/2020   NEUTROABS 2.3 05/30/2020    Imaging:  No results found.  Medications: I have reviewed the patient's current medications.  Assessment/Plan: 1.Chronic lymphocytic leukemia 2.Anemia 3.Chronic renal failure, on hemodialysis 4.Vitamin A63, folic acid? Deficiency 5.Diabetes 6.Hypertension 7.Thrombocytopenia 8.  Right transmetatarsal amputation 02/15/2020  Disposition: Thomas Mcguire has chronic lymphocytic leukemia.  He has stable mild anemia and lymphocytosis.  Platelet count is slightly lower today.  Peripheral adenopathy is stable.  There is no indication for treating the CLL at  present.  He will obtain the second COVID booster vaccine.  We also made a referral for Evusheld.  He will return for a CBC and follow-up visit in 3 months.  We are available to see him sooner if needed.  Patient seen with Dr. Benay Spice.  Yakub Lodes ANP/GNP-BC   05/30/2020  12:00 PM   This was a shared visit with [name].  Thomas Mcguire was interviewed and examined.  He appears asymptomatic from the CLL.  The plan is to continue observation.  He will be referred for a COVID booster vaccine and consideration of Evusheld.  I was present for greater than 50% of today's visit.  I performed medical decision making.  Julieanne Manson, MD

## 2020-06-01 ENCOUNTER — Telehealth: Payer: Self-pay | Admitting: Adult Health

## 2020-06-01 NOTE — Telephone Encounter (Signed)
I called patient to discuss Evusheld, a long acting monoclonal antibody injection administered to patients with decreased immune systems or intolerance/allergy to the COVID 19 vaccine as COVID19 prevention.    Unable to reach patient.  LMOM to return my call.  Shafter Jupin, NP  

## 2020-06-18 ENCOUNTER — Ambulatory Visit: Payer: Medicare Other | Admitting: Vascular Surgery

## 2020-06-18 ENCOUNTER — Other Ambulatory Visit: Payer: Self-pay

## 2020-06-18 ENCOUNTER — Encounter: Payer: Self-pay | Admitting: Vascular Surgery

## 2020-06-18 VITALS — BP 107/60 | HR 82 | Temp 97.9°F | Resp 20 | Ht 79.0 in | Wt 172.0 lb

## 2020-06-18 DIAGNOSIS — Z992 Dependence on renal dialysis: Secondary | ICD-10-CM

## 2020-06-18 DIAGNOSIS — N186 End stage renal disease: Secondary | ICD-10-CM | POA: Diagnosis not present

## 2020-06-18 NOTE — Progress Notes (Signed)
REASON FOR VISIT:   Right arm fistula is difficult to cannulate.  The consult is requested by Dr. Posey Pronto.  MEDICAL ISSUES:   END-STAGE RENAL DISEASE: This patient has a right basilic vein transposition.  Apparently the dialysis unit is having a hard time cannulating this as it is somewhat medial.  However based on my assessment it would be difficult to move it much further laterally given the length of the vein.  I think if he positions his arm correctly it should be reasonably easy to access.  The vein is not however especially large which might also be contributing.  He has undergone a previous intervention for a stenosis within the fistula.  Currently the fistula has a good thrill and he does not have evidence of an outflow stenosis.  If he continues to have problems cannulating the fistula I think we would have to consider placement of an AV graft in the right arm or new access in the left arm.    HPI:   Thomas Mcguire is a pleasant 80 y.o. male who dialyzes on Tuesdays Thursdays and Saturdays.  He had a for stage basilic vein transposition on 10/01/2019.  On 11/13/2019 Dr. Donnetta Hutching performed a second stage basilic vein transposition.  He currently is dialyzed with a tunneled dialysis catheter in the right IJ.  He was referred because the been having some difficulty cannulating the fistula since it is somewhat medial.  He tells me that he had an intervention on the fistula not too long ago because there was a stenosis in the fistula.  He denies any recent uremic symptoms.  Past Medical History:  Diagnosis Date  . Anemia    low iron  . Arthritis   . Chronic renal insufficiency    CHECKED Q3MOS PER PT. Stage 4 as of 05/22/2018 per pt.  . Diabetes mellitus    type 2  . ESRD (end stage renal disease) ([place])   . Gout    Has not had recently- 08/24/11  . Hyperlipidemia   . Hypertension   . 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 PAD (peripheral artery disease)  ([place])   . Wears dentures     Family History  Problem Relation Age of Onset  . Colon cancer Neg Hx     SOCIAL HISTORY: Social History   Tobacco Use  . Smoking status: Former Smoker    Types: Cigarettes    Quit date: 03/11/1996    Years since quitting: 24.2  . Smokeless tobacco: Never Used  Substance Use Topics  . Alcohol use: No    No Known Allergies  Current Outpatient Medications  Medication Sig Dispense Refill  . Accu-Chek FastClix Lancets MISC Use one lancet 5 times every day  DX:  250.00    . aspirin EC 81 MG EC tablet Take 1 tablet (81 mg total) by mouth daily. Swallow whole. 30 tablet 11  . B Complex-C-Folic Acid (NEPHRO-VITE RX PO) Take 1 tablet by mouth at bedtime.    . Blood Glucose Calibration (ACCU-CHEK AVIVA) SOLN U.A.D TO CALIBRATE METER    . Blood Glucose Monitoring Suppl (ACCU-CHEK AVIVA PLUS) w/Device KIT Use meter to check blood sugar    . COVID-19 mRNA Vac-TriS, Pfizer, (PFIZER-BIONT COVID-19 VAC-TRIS) SUSP injection Inject into the muscle. 0.3 mL 0  . cyanocobalamin (,VITAMIN B-12,) 1000 MCG/ML injection Vitamin B12 injection: 1000 mcg (1 mL) by subcu injection once daily for 7 days; then once weekly for 4 weeks;  then once monthly thereafter.  7 07/15/2010 (Patient taking differently: Inject 1,000 mcg into the muscle every 30 (thirty) days.) 12 mL 1  . folic acid (FOLVITE) 1 MG tablet TAKE 1 TABLET BY MOUTH EVERY DAY 90 tablet 1  . glucose blood (ACCU-CHEK AVIVA PLUS) test strip Check blood sugar 5 times daily (dx E11.49)    . insulin NPH Human (NOVOLIN N) 100 UNIT/ML injection Inject 0.5 mLs (50 Units total) into the skin daily before breakfast. (Patient taking differently: Inject 50 Units into the skin daily as needed (high blood sugar).) 10 mL 11  . Insulin Syringe-Needle U-100 31G X 5/16" 0.5 ML MISC Use one new syringe 6 times daily for insulin DX E11.49    . Lancets Misc. (ACCU-CHEK FASTCLIX LANCET) KIT u.a.d to test BS 5 times daily    . midodrine  (PROAMATINE) 10 MG tablet Take 10 mg by mouth 3 (three) times a week. Tues, Thurs, Sat    . oxyCODONE (OXY IR/ROXICODONE) 5 MG immediate release tablet Take 1-2 tablets (5-10 mg total) by mouth every 4 (four) hours as needed for moderate pain (pain score 4-6). 15 tablet 0  . pravastatin (PRAVACHOL) 40 MG tablet Take 40 mg by mouth at bedtime.    . sucroferric oxyhydroxide (VELPHORO) 500 MG chewable tablet Chew 1 tablet (500 mg total) by mouth 3 (three) times daily with meals. 90 tablet   . traZODone (DESYREL) 50 MG tablet Take 50 mg by mouth at bedtime.    . calcitRIOL (ROCALTROL) 0.25 MCG capsule Take 0.25 mcg by mouth every Monday, Wednesday, and Friday.  (Patient not taking: No sig reported)  5  . clopidogrel (PLAVIX) 75 MG tablet Take 1 tablet (75 mg total) by mouth daily. (Patient not taking: No sig reported)    . melatonin 1 MG TABS tablet Take 3 mg by mouth at bedtime as needed (sleep). (Patient not taking: No sig reported)     No current facility-administered medications for this visit.    REVIEW OF SYSTEMS:  '[X]'  denotes positive finding, '[ ]'  denotes negative finding Cardiac  Comments:  Chest pain or chest pressure:    Shortness of breath upon exertion:    Short of breath when lying flat:    Irregular heart rhythm:        Vascular    Pain in calf, thigh, or hip brought on by ambulation:    Pain in feet at night that wakes you up from your sleep:     Blood clot in your veins:    Leg swelling:         Pulmonary    Oxygen at home:    Productive cough:     Wheezing:         Neurologic    Sudden weakness in arms or legs:     Sudden numbness in arms or legs:     Sudden onset of difficulty speaking or slurred speech:    Temporary loss of vision in one eye:     Problems with dizziness:         Gastrointestinal    Blood in stool:     Vomited blood:         Genitourinary    Burning when urinating:     Blood in urine:        Psychiatric    Major depression:          Hematologic    Bleeding problems:    Problems with blood clotting too easily:  Skin    Rashes or ulcers:        Constitutional    Fever or chills:     PHYSICAL EXAM:   Vitals:   06/18/20 0911  BP: 107/60  Pulse: 82  Resp: 20  Temp: 97.9 F (36.6 C)  SpO2: 97%  Weight: 172 lb (78 kg)  Height: '6\' 7"'  (2.007 m)    GENERAL: The patient is a well-nourished male, in no acute distress. The vital signs are documented above. CARDIAC: There is a regular rate and rhythm.  VASCULAR: He has a diminished but palpable radial pulse bilaterally. He has a thrill in his right upper arm fistula.  The fistula is not pulsatile. PULMONARY: There is good air exchange bilaterally without wheezing or rales. SKIN: There are no ulcers or rashes noted. PSYCHIATRIC: The patient has a normal affect.  DATA:    No new data  Deitra Mayo [facility] [phone]

## 2020-06-19 ENCOUNTER — Telehealth: Payer: Self-pay | Admitting: *Deleted

## 2020-06-19 NOTE — Telephone Encounter (Signed)
Evusheld coordinator, Wilber Bihari unsuccessful in reaching patient. This RN called him and left message with contact # for NP to arrange the appointment. Also sent Mychart message.

## 2020-06-23 ENCOUNTER — Other Ambulatory Visit: Payer: Self-pay | Admitting: Internal Medicine

## 2020-06-23 ENCOUNTER — Telehealth: Payer: Self-pay | Admitting: Adult Health

## 2020-06-23 DIAGNOSIS — R0602 Shortness of breath: Secondary | ICD-10-CM

## 2020-06-23 DIAGNOSIS — R599 Enlarged lymph nodes, unspecified: Secondary | ICD-10-CM

## 2020-06-23 NOTE — Telephone Encounter (Signed)
Patient called and let me know that he is not interested in Evusheld at this time.  He will let me know if he changes his mind.  Wilber Bihari, NP

## 2020-06-30 ENCOUNTER — Other Ambulatory Visit: Payer: Self-pay

## 2020-06-30 ENCOUNTER — Ambulatory Visit (INDEPENDENT_AMBULATORY_CARE_PROVIDER_SITE_OTHER): Payer: Medicare Other | Admitting: Orthopedic Surgery

## 2020-06-30 ENCOUNTER — Encounter: Payer: Self-pay | Admitting: Orthopedic Surgery

## 2020-06-30 DIAGNOSIS — Z89431 Acquired absence of right foot: Secondary | ICD-10-CM

## 2020-06-30 DIAGNOSIS — L97511 Non-pressure chronic ulcer of other part of right foot limited to breakdown of skin: Secondary | ICD-10-CM

## 2020-06-30 DIAGNOSIS — L97521 Non-pressure chronic ulcer of other part of left foot limited to breakdown of skin: Secondary | ICD-10-CM | POA: Diagnosis not present

## 2020-06-30 DIAGNOSIS — Z89432 Acquired absence of left foot: Secondary | ICD-10-CM | POA: Diagnosis not present

## 2020-06-30 NOTE — Progress Notes (Signed)
Office Visit Note   Patient: Thomas Mcguire           Date of Birth: October 04, 1940           MRN: [id number] Visit Date: 06/30/2020              Requested by: Leanna Battles, [address] [place],  [place] [address] PCP: Leanna Battles, MD  Chief Complaint  Patient presents with   Left Foot - Follow-up      HPI: Patient presents with acute pain and ulceration left foot first metatarsal head status post bilateral transmetatarsal amputations.  Assessment & Plan: Visit Diagnoses:  1. History of transmetatarsal amputation of left foot ([place])   2. History of transmetatarsal amputation of right foot ([place])   3. Non-pressure chronic ulcer of other part of right foot limited to breakdown of skin ([place])     Plan: Ulcer was debrided back to healthy viable tissue a felt relieving donut is applied reevaluate in 2 weeks  Follow-Up Instructions: Return in about 2 weeks (around 07/14/2020).   Ortho Exam  Patient is alert, oriented, no adenopathy, well-dressed, normal affect, normal respiratory effort. Examination patient has a stable right transmetatarsal amputation with good dorsiflexion of the ankle left foot also has a stable transmetatarsal amputation with good dorsiflexion of the ankle however patient has developed a ulcer beneath the first metatarsal head this is 1 mm in diameter.  After informed consent a 10 blade knife was used to debride the skin and soft tissue back to healthy viable granulation tissue the ulcer after debridement was 3 cm in diameter 1 mm deep this ulcer did not probe to bone there was a superficial purulent abscess and this was debrided and cleansed.  No evidence of any deep infection no surrounding cellulitis.  Imaging: No results found. No images are attached to the encounter.  Labs: Lab Results  Component Value Date   HGBA1C 6.1 (H) 02/11/2020   HGBA1C 6.2 (H) 11/07/2019   HGBA1C 6.8 (H) 07/11/2018   CRP <0.8 10/14/2017   LABURIC 5.8 11/02/2017    REPTSTATUS 02/18/2020 FINAL 02/12/2020   REPTSTATUS 02/18/2020 FINAL 02/12/2020   CULT  02/12/2020    NO GROWTH 5 DAYS Performed at [facility], [place] [address] [place], [place] [address]    CULT  02/12/2020    NO GROWTH 5 DAYS Performed at [facility] [address] [place], [place] [address]      Lab Results  Component Value Date   ALBUMIN 2.6 (L) 02/20/2020   ALBUMIN 2.9 (L) 02/19/2020   ALBUMIN 2.5 (L) 02/18/2020    Lab Results  Component Value Date   MG 2.6 (H) 02/20/2020   MG 2.7 (H) 02/19/2020   MG 2.1 11/11/2019   No results found for: VD25OH  No results found for: PREALBUMIN CBC EXTENDED Latest Ref Rng & Units 05/30/2020 02/20/2020 02/19/2020  WBC 4.0 - 10.5 K/uL 10.5 12.8(H) 15.8(H)  RBC 4.22 - 5.81 MIL/uL 4.05(L) 3.47(L) 3.88(L)  HGB 13.0 - 17.0 g/dL 12.1(L) 9.5(L) 10.7(L)  HCT 39.0 - 52.0 % 38.8(L) 30.1(L) 33.4(L)  PLT 150 - 400 K/uL 89(L) 144(L) 143(L)  NEUTROABS 1.7 - 7.7 K/uL 2.3 4.0 4.6  LYMPHSABS 0.7 - 4.0 K/uL 7.5(H) 7.3(H) 9.4(H)     There is no height or weight on file to calculate BMI.  Orders:  No orders of the defined types were placed in this encounter.  No orders of the defined types were placed in  this encounter.    Procedures: No procedures performed  Clinical Data: No additional findings.  ROS:  All other systems negative, except as noted in the HPI. Review of Systems  Objective: Vital Signs: There were no vitals taken for this visit.  Specialty Comments:  No specialty comments available.  PMFS History: Patient Active Problem List   Diagnosis Date Noted   Acute on chronic combined systolic and diastolic heart failure ([place]) 03/11/2020   End stage renal disease ([place]) 03/11/2020   Dependence on renal dialysis ([place]) 03/11/2020   Non-pressure chronic ulcer of other part of right foot limited to breakdown of skin ([place]) 03/11/2020   Pressure injury of sacral region, stage 2 ([place]) 03/11/2020   Primary insomnia  03/11/2020   Reactive depression 03/11/2020   Necrotic toes ([place]) 02/11/2020   Cellulitis and abscess of toe of right foot 02/10/2020   Cellulitis of right foot 02/10/2020   Bilateral hearing loss 11/26/2019   Excessive cerumen in both ear canals 11/26/2019   Fluid overload 11/07/2019   Cardiomegaly 11/07/2019   DM2 (diabetes mellitus, type 2) ([place]) 11/07/2019   Heart failure ([place]) 12/15/2018   CKD (chronic kidney disease) 10/06/2018   Amputation of great toe ([place]) 08/14/2018   PVD (peripheral vascular disease) ([place]) 07/11/2018   Degeneration of thoracic intervertebral disc 03/09/2018   Pain in thoracic spine 03/09/2018   B12 deficiency 29/92/4268   Folic acid deficiency 34/19/6222   Chronic lymphocytic leukemia (CLL), 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   Shortness of breath 11/07/2017   Anemia in chronic kidney disease 10/12/2017   Cellulitis of right lower limb 08/01/2017   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 Stage 4 as of 05/22/2018 per pt.   Diabetes mellitus    type 2   ESRD (end stage renal disease) (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   PAD (peripheral artery disease) ([place])    Wears dentures     Family History  Problem Relation Age of Onset   Colon cancer Neg Hx     Past Surgical History:  Procedure  Laterality Date   A/V FISTULAGRAM N/A 08/11/2018   Procedure: A/V FISTULAGRAM - Right Arm;  Surgeon: Angelia Mould, MD;  Location: [facility];  Service: Cardiovascular;  Laterality: N/A;   A/V FISTULAGRAM N/A 09/07/2019   Procedure: A/V FISTULAGRAM - Right AVF;  Surgeon: Angelia Mould, MD;  Location: [facility];  Service: Cardiovascular;  Laterality: N/A;   ABDOMINAL AORTOGRAM W/LOWER EXTREMITY Right 07/03/2018   Procedure: ABDOMINAL AORTOGRAM W/LOWER EXTREMITY;  Surgeon: Waynetta Sandy, MD;  Location: [facility];  Service: Cardiovascular;  Laterality: Right;   ABDOMINAL AORTOGRAM W/LOWER EXTREMITY N/A 02/12/2020   Procedure: ABDOMINAL AORTOGRAM W/LOWER EXTREMITY;  Surgeon: Serafina Mitchell, MD;  Location:  [facility];  Service: Cardiovascular;  Laterality: N/A;   AMPUTATION  08/27/2011   Procedure: AMPUTATION DIGIT;  Surgeon: Newt Minion, MD;  Location: [facility];  Service: Orthopedics;  Laterality: Left;  Left Foot 3rd toe Amputation MTP joint   AMPUTATION Right 07/11/2018   Procedure: AMPUTATION RIGHT GREAT TOE;  Surgeon: Angelia Mould, MD;  Location: [facility];  Service: Vascular;  Laterality: Right;   AMPUTATION Right 02/15/2020   Procedure: TRANSMETATARSAL AMPUTATION;  Surgeon: Newt Minion, MD;  Location: [facility];  Service: Orthopedics;  Laterality: Right;   amputation great toe  2009   left   AV FISTULA PLACEMENT Right 05/23/2018   Procedure: RADIAL- CEPHALIC ARTERIOVENOUS (AV) FISTULA CREATION RIGHT ARM AND LIGATION OF COMPETING BRANCH.;  Surgeon: Angelia Mould, MD;  Location: [facility];  Service: Vascular;  Laterality: Right;   [place] Right 10/01/2019   Procedure: FIRST STAGE [place];  Surgeon: Angelia Mould, MD;  Location: [facility];  Service: Vascular;  Laterality: Right;   [place] Right 11/13/2019   Procedure: RIGHT SECOND STAGE [place];   Surgeon: Rosetta Posner, MD;  Location: [facility];  Service: Vascular;  Laterality: Right;   COLONOSCOPY     cyst elbow  1999   right   FEMORAL-POPLITEAL BYPASS GRAFT Right 07/11/2018   Procedure: BYPASS GRAFT FEMORAL-POPLITEAL ARTERY;  Surgeon: Angelia Mould, MD;  Location: [facility];  Service: Vascular;  Laterality: Right;   FOOT SURGERY     both feet for something that was torn   IR FLUORO GUIDE CV LINE RIGHT  11/08/2019   IR US GUIDE VASC ACCESS RIGHT  11/08/2019   LIGATION OF ARTERIOVENOUS  FISTULA Right 10/01/2019   Procedure: LIGATION OF RIGHT RADIOCEPHALIC  ARTERIOVENOUS  FISTULA;  Surgeon: Angelia Mould, MD;  [facility];  Service: Vascular;  Laterality: Right;   PERIPHERAL VASCULAR BALLOON ANGIOPLASTY Right 02/12/2020   Procedure: PERIPHERAL VASCULAR BALLOON ANGIOPLASTY;  Surgeon: Serafina Mitchell, MD;  Location: [facility];  Service: Cardiovascular;  Laterality: Right;  peroneal   PERIPHERAL VASCULAR INTERVENTION Right 02/12/2020   Procedure: PERIPHERAL VASCULAR INTERVENTION;  Surgeon: Serafina Mitchell, MD;  Location: [facility];  Service: Cardiovascular;  Laterality: Right;  fem/pop bypass graft   TOE AMPUTATION  2010   left 2nd toe   UPPER GI ENDOSCOPY     Social History   Occupational History   Not on file  Tobacco Use   Smoking status: Former    Pack years: 0.00    Types: Cigarettes    Quit date: 03/11/1996    Years since quitting: 24.3   Smokeless tobacco: Never  Vaping Use   Vaping Use: Never used  Substance and Sexual Activity   Alcohol use: No   Drug use: No   Sexual activity: Not Currently

## 2020-07-04 ENCOUNTER — Ambulatory Visit (INDEPENDENT_AMBULATORY_CARE_PROVIDER_SITE_OTHER): Payer: Medicare Other | Admitting: Physician Assistant

## 2020-07-04 ENCOUNTER — Encounter: Payer: Self-pay | Admitting: Physician Assistant

## 2020-07-04 ENCOUNTER — Other Ambulatory Visit: Payer: Self-pay

## 2020-07-04 DIAGNOSIS — Z89432 Acquired absence of left foot: Secondary | ICD-10-CM

## 2020-07-04 MED ORDER — DOXYCYCLINE HYCLATE 100 MG PO TABS: 100.0000 mg | ORAL_TABLET | Freq: Two times a day (BID) | ORAL | 0 refills | Status: DC

## 2020-07-04 NOTE — Progress Notes (Signed)
Office Visit Note   Patient: Thomas Mcguire           Date of Birth: 12/01/1940           MRN: [id number] Visit Date: 07/04/2020              Requested by: Leanna Battles, MD [place],  [place] [id number] PCP: Leanna Battles, MD  No chief complaint on file.     HPI: Patient is a pleasant 80 year old gentleman who was in earlier this week for debridement of an ulcer on his left foot.  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  His wife was concerned because when she removed the dressing it had quite a bit of whiteness around it.  Also concerned that the top of his foot is a bit more red.  Assessment & Plan: Visit Diagnoses: No diagnosis found.  Plan: We will call in a short course of doxycycline patient is already on probiotics already has a follow-up appointment in 2 weeks but of course if they have any concerns we can follow-up at any time  Follow-Up Instructions: No follow-ups on file.   Ortho Exam  Patient is alert, oriented, no adenopathy, well-dressed, normal affect, normal respiratory effort. Examination of his foot.  Area noted of previous callus debridement.  Small amount of bleeding but no purulent drainage or foul odor no surrounding cellulitis.  He does have some erythema on the dorsum of his foot which his wife does not recall him having in the past.  No ascending cellulitis into the ankle or leg  Imaging: No results found. No images are attached to the encounter.  Labs: Lab Results  Component Value Date   HGBA1C 6.1 (H) 02/11/2020   HGBA1C 6.2 (H) 11/07/2019   HGBA1C 6.8 (H) 07/11/2018   CRP <0.8 10/14/2017   LABURIC 5.8 11/02/2017   REPTSTATUS 02/18/2020 FINAL 02/12/2020   REPTSTATUS 02/18/2020 FINAL 02/12/2020   CULT  02/12/2020    NO GROWTH 5 DAYS Performed at [facility], [place] [address], [place], [place] [address]    CULT  02/12/2020    NO GROWTH 5 DAYS Performed at [facility]  [address], [place], [place] [address]      Lab Results  Component Value Date   ALBUMIN 2.6 (L) 02/20/2020   ALBUMIN 2.9 (L) 02/19/2020   ALBUMIN 2.5 (L) 02/18/2020    Lab Results  Component Value Date   MG 2.6 (H) 02/20/2020   MG 2.7 (H) 02/19/2020   MG 2.1 11/11/2019   No results found for: VD25OH  No results found for: PREALBUMIN CBC EXTENDED Latest Ref Rng & Units 05/30/2020 02/20/2020 02/19/2020  WBC 4.0 - 10.5 K/uL 10.5 12.8(H) 15.8(H)  RBC 4.22 - 5.81 MIL/uL 4.05(L) 3.47(L) 3.88(L)  HGB 13.0 - 17.0 g/dL 12.1(L) 9.5(L) 10.7(L)  HCT 39.0 - 52.0 % 38.8(L) 30.1(L) 33.4(L)  PLT 150 - 400 K/uL 89(L) 144(L) 143(L)  NEUTROABS 1.7 - 7.7 K/uL 2.3 4.0 4.6  LYMPHSABS 0.7 - 4.0 K/uL 7.5(H) 7.3(H) 9.4(H)     There is no height or weight on file to calculate BMI.  Orders:  No orders of the defined types were placed in this encounter.  No orders of the defined types were placed in this encounter.    Procedures: No procedures performed  Clinical Data: No additional findings.  ROS:  All other systems negative, except as noted in the HPI. Review of Systems  Objective: Vital Signs: There were no vitals taken for this visit.  Specialty Comments:  No specialty comments available.  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[place]) 03/11/2020   Non-pressure chronic ulcer of other part of right foot limited to breakdown of skin ([place]) 03/11/2020   Pressure injury of sacral region, stage 2 ([place]) 03/11/2020   Primary insomnia 03/11/2020   Reactive depression 03/11/2020   Necrotic toes ([place]) 02/11/2020   Cellulitis and abscess of toe of right foot 02/10/2020   Cellulitis of right foot 02/10/2020   Bilateral hearing loss 11/26/2019   Excessive cerumen in both ear canals 11/26/2019   Fluid overload 11/07/2019    Cardiomegaly 11/07/2019   DM2 (diabetes mellitus, type 2) ([place]) 11/07/2019   Heart failure ([place]) 12/15/2018   CKD (chronic kidney disease) 10/06/2018   Amputation of great toe ([place]) 08/14/2018   PVD (peripheral vascular disease) ([place]) 07/11/2018   Degeneration of thoracic intervertebral disc 03/09/2018   Pain in thoracic spine 03/09/2018   B12 deficiency 65/46/5035   Folic acid deficiency 46/56/8127   Chronic lymphocytic leukemia (CLL), 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   Shortness of breath 11/07/2017   Anemia in chronic kidney disease 10/12/2017   Cellulitis of right lower limb 08/01/2017   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 ([place]) 08/01/2017   Pain in right ankle and joints of right foot 11/08/2016   Peripheral venous insufficiency 07/26/2016   Encounter for general adult medical examination without abnormal findings 04/14/2015   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[place]) 04/26/2014   Obesity 08/26/2010   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 Stage 4 as of 05/22/2018 per pt.   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   PAD (peripheral artery disease) ([place])    Wears dentures     Family History  Problem Relation Age of Onset   Colon cancer Neg Hx     Past Surgical History:  Procedure Laterality Date   A/V FISTULAGRAM N/A 08/11/2018   Procedure: A/V FISTULAGRAM - Right Arm;  Surgeon: Angelia Mould, MD;  Location: [facility];  Service: Cardiovascular;  Laterality: N/A;   A/V FISTULAGRAM N/A 09/07/2019   Procedure: A/V FISTULAGRAM - Right AVF;  Surgeon:  Angelia Mould, MD;  Location: [facility];  Service: Cardiovascular;  Laterality: N/A;   ABDOMINAL AORTOGRAM W/LOWER EXTREMITY Right 07/03/2018   Procedure: ABDOMINAL AORTOGRAM W/LOWER EXTREMITY;  Surgeon: Waynetta Sandy, MD;  Location: [facility];  Service: Cardiovascular;  Laterality: Right;   ABDOMINAL AORTOGRAM W/LOWER EXTREMITY N/A 02/12/2020   Procedure: ABDOMINAL AORTOGRAM W/LOWER EXTREMITY;  Surgeon: Serafina Mitchell, MD;  Location: [facility];  Service: Cardiovascular;  Laterality: N/A;   AMPUTATION  08/27/2011   Procedure: AMPUTATION DIGIT;  Surgeon: Newt Minion, MD;  Location: [facility];  Service:  Orthopedics;  Laterality: Left;  Left Foot 3rd toe Amputation MTP joint   AMPUTATION Right 07/11/2018   Procedure: AMPUTATION RIGHT GREAT TOE;  Surgeon: Angelia Mould, MD;  Location: [facility];  Service: Vascular;  Laterality: Right;   AMPUTATION Right 02/15/2020   Procedure: TRANSMETATARSAL AMPUTATION;  Surgeon: Newt Minion, MD;  Location: [facility];  Service: Orthopedics;  Laterality: Right;   amputation great toe  2009   left   AV FISTULA PLACEMENT Right 05/23/2018   Procedure: RADIAL- CEPHALIC ARTERIOVENOUS (AV) FISTULA CREATION RIGHT ARM AND LIGATION OF COMPETING BRANCH.;  Surgeon: Angelia Mould, MD;  Location: [facility];  Service: Vascular;  Laterality: Right;   [place] Right 10/01/2019   Procedure: FIRST STAGE [place];  Surgeon: Angelia Mould, MD;  Location: [facility];  Service: Vascular;  Laterality: Right;   [place] Right 11/13/2019   Procedure: RIGHT SECOND STAGE [place];  Surgeon: Rosetta Posner, MD;  Location: [facility];  Service: Vascular;  Laterality: Right;   COLONOSCOPY     cyst elbow  1999   right   FEMORAL-POPLITEAL BYPASS GRAFT Right 07/11/2018   Procedure: BYPASS GRAFT FEMORAL-POPLITEAL ARTERY;  Surgeon: Angelia Mould, MD;  Location: [facility];   Service: Vascular;  Laterality: Right;   FOOT SURGERY     both feet for something that was torn   IR FLUORO GUIDE CV LINE RIGHT  11/08/2019   IR US GUIDE VASC ACCESS RIGHT  11/08/2019   LIGATION OF ARTERIOVENOUS  FISTULA Right 10/01/2019   Procedure: LIGATION OF RIGHT RADIOCEPHALIC  ARTERIOVENOUS  FISTULA;  Surgeon: Angelia Mould, MD;  Location: [facility];  Service: Vascular;  Laterality: Right;   PERIPHERAL VASCULAR BALLOON ANGIOPLASTY Right 02/12/2020   Procedure: PERIPHERAL VASCULAR BALLOON ANGIOPLASTY;  Surgeon: Serafina Mitchell, MD;  Location: [facility];  Service: Cardiovascular;  Laterality: Right;  peroneal   PERIPHERAL VASCULAR INTERVENTION Right 02/12/2020   Procedure: PERIPHERAL VASCULAR INTERVENTION;  Surgeon: Serafina Mitchell, MD;  Location: [facility];  Service: Cardiovascular;  Laterality: Right;  fem/pop bypass graft   TOE AMPUTATION  2010   left 2nd toe   UPPER GI ENDOSCOPY     Social History   Occupational History   Not on file  Tobacco Use   Smoking status: Former    Pack years: 0.00    Types: Cigarettes    Quit date: 03/11/1996    Years since quitting: 24.3   Smokeless tobacco: Never  Vaping Use   Vaping Use: Never used  Substance and Sexual Activity   Alcohol use: No   Drug use: No   Sexual activity: Not Currently

## 2020-07-11 ENCOUNTER — Encounter: Payer: Self-pay | Admitting: Orthopedic Surgery

## 2020-07-11 ENCOUNTER — Ambulatory Visit (INDEPENDENT_AMBULATORY_CARE_PROVIDER_SITE_OTHER): Payer: Medicare Other | Admitting: Orthopedic Surgery

## 2020-07-11 ENCOUNTER — Ambulatory Visit (INDEPENDENT_AMBULATORY_CARE_PROVIDER_SITE_OTHER): Payer: Medicare Other | Admitting: Physician Assistant

## 2020-07-11 ENCOUNTER — Ambulatory Visit: Payer: Self-pay

## 2020-07-11 ENCOUNTER — Encounter: Payer: Self-pay | Admitting: Physician Assistant

## 2020-07-11 DIAGNOSIS — I739 Peripheral vascular disease, unspecified: Secondary | ICD-10-CM

## 2020-07-11 DIAGNOSIS — L97521 Non-pressure chronic ulcer of other part of left foot limited to breakdown of skin: Secondary | ICD-10-CM | POA: Diagnosis not present

## 2020-07-11 DIAGNOSIS — Z89432 Acquired absence of left foot: Secondary | ICD-10-CM

## 2020-07-11 MED ORDER — NITROGLYCERIN 0.2 MG/HR TD PT24: 0.2000 mg | MEDICATED_PATCH | Freq: Every day | TRANSDERMAL | 12 refills | Status: AC

## 2020-07-11 MED ORDER — PENTOXIFYLLINE ER 400 MG PO TBCR: 400.0000 mg | EXTENDED_RELEASE_TABLET | Freq: Three times a day (TID) | ORAL | 3 refills | Status: AC

## 2020-07-11 NOTE — Progress Notes (Signed)
Office Visit Note   Patient: Thomas Mcguire           Date of Birth: [birth year]           MRN: [id number] Visit Date: 07/11/2020              Requested by: Leanna Battles, MD [address] [place],  [place] [address] PCP: Leanna Battles, MD  Chief Complaint  Patient presents with   Left Foot - Pain      HPI: Patient is a 80 year old gentleman who presents with acute pain ulceration left foot first metatarsal head status post great toe second toe and third toe amputations.  Patient is also status post revascularization to the right lower extremity and status post toe amputations on the right.  Assessment & Plan: Visit Diagnoses:  1. History of transmetatarsal amputation of left foot ([place])   2. PVD (peripheral vascular disease) ([place])   3. Non-pressure chronic ulcer of other part of left foot limited to breakdown of skin ([place])     Plan: We will request an urgent consult with vascular vein surgery patient has an acute ischemic changes to the left foot.  He still has Doppler flow at the dorsalis pedis and posterior tibial pulse so patient should not need to go to the emergency room at this time but his foot is cold to the touch.  Will send in prescriptions for nitroglycerin patch and Trental and will follow-up next Thursday  Follow-Up Instructions: Return in about 1 week (around 07/18/2020).   Ortho Exam  Patient is alert, oriented, no adenopathy, well-dressed, normal affect, normal respiratory effort. Examination patient has a thready palpable dorsalis pedis pulse.  By Doppler patient has monophasic flow at the dorsalis pedis and posterior tibial.  Patient has a new ischemic ulcer over the first metatarsal head which is 2 cm in diameter is superficial with ischemic skin changes.  The medial column is cold to the touch.  Imaging: XR Foot Complete Left  Result Date: 07/11/2020 Three-view radiographs of the left foot shows calcified arteries out to the metatarsal heads.  Patient  is status post first second and third toe amputations without destructive bony changes without air in the soft tissue.  No images are attached to the encounter.  Labs: Lab Results  Component Value Date   HGBA1C 6.1 (H) 02/11/2020   HGBA1C 6.2 (H) 11/07/2019   HGBA1C 6.8 (H) 07/11/2018   CRP <0.8 10/14/2017   LABURIC 5.8 11/02/2017   REPTSTATUS 02/18/2020 FINAL 02/12/2020   REPTSTATUS 02/18/2020 FINAL 02/12/2020   CULT  02/12/2020    NO GROWTH 5 DAYS Performed at [facility], [place] [address], [place], [place] [address]    CULT  02/12/2020    NO GROWTH 5 DAYS Performed at [facility] [address] [place], [place] [address]      Lab Results  Component Value Date   ALBUMIN 2.6 (L) 02/20/2020   ALBUMIN 2.9 (L) 02/19/2020   ALBUMIN 2.5 (L) 02/18/2020    Lab Results  Component Value Date   MG 2.6 (H) 02/20/2020   MG 2.7 (H) 02/19/2020   MG 2.1 11/11/2019   No results found for: VD25OH  No results found for: PREALBUMIN CBC EXTENDED Latest Ref Rng & Units 05/30/2020 02/20/2020 02/19/2020  WBC 4.0 - 10.5 K/uL 10.5 12.8(H) 15.8(H)  RBC 4.22 - 5.81 MIL/uL 4.05(L) 3.47(L) 3.88(L)  HGB 13.0 - 17.0 g/dL 12.1(L) 9.5(L) 10.7(L)  HCT 39.0 - 52.0 % 38.8(L) 30.1(L)  33.4(L)  PLT 150 - 400 K/uL 89(L) 144(L) 143(L)  NEUTROABS 1.7 - 7.7 K/uL 2.3 4.0 4.6  LYMPHSABS 0.7 - 4.0 K/uL 7.5(H) 7.3(H) 9.4(H)     There is no height or weight on file to calculate BMI.  Orders:  Orders Placed This Encounter  Procedures   Ambulatory referral to Vascular Surgery   Meds ordered this encounter  Medications   nitroGLYCERIN (NITRODUR - DOSED IN MG/24 HR) 0.2 mg/hr patch    Sig: Place 1 patch (0.2 mg total) onto the skin daily.    Dispense:  30 patch    Refill:  12   pentoxifylline (TRENTAL) 400 MG CR tablet    Sig: Take 1 tablet (400 mg total) by mouth 3 (three) times daily with meals.    Dispense:  90 tablet    Refill:  3     Procedures: No procedures performed  Clinical  Data: No additional findings.  ROS:  All other systems negative, except as noted in the HPI. Review of Systems  Objective: Vital Signs: There were no vitals taken for this visit.  Specialty Comments:  No specialty comments available.  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[place]) 03/11/2020   Non-pressure chronic ulcer of other part of right foot limited to breakdown of skin ([place]) 03/11/2020   Pressure injury of sacral region, stage 2 ([place]) 03/11/2020   Primary insomnia 03/11/2020   Reactive depression 03/11/2020   Necrotic toes ([place]) 02/11/2020   Cellulitis and abscess of toe of right foot 02/10/2020   Cellulitis of right foot 02/10/2020   Bilateral hearing loss 11/26/2019   Excessive cerumen in both ear canals 11/26/2019   Fluid overload 11/07/2019   Cardiomegaly 11/07/2019   DM2 (diabetes mellitus, type 2) ([place]) 11/07/2019   Heart failure ([place]) 12/15/2018   CKD (chronic kidney disease) 10/06/2018   Amputation of great toe ([place]) 08/14/2018   PVD (peripheral vascular disease) ([place]) 07/11/2018   Degeneration of thoracic intervertebral disc 03/09/2018   Pain in thoracic spine 03/09/2018   B12 deficiency 72/53/6644   Folic acid deficiency 03/47/4259   Chronic lymphocytic leukemia (CLL), 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   Shortness of breath 11/07/2017   Anemia in chronic kidney disease 10/12/2017   Cellulitis of right lower limb 08/01/2017   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 ([place]) 08/01/2017   Pain in right ankle and joints of right foot 11/08/2016   Peripheral venous insufficiency 07/26/2016   Encounter for general adult medical examination without abnormal findings 04/14/2015   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[place]) 04/26/2014   Obesity 08/26/2010    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 Stage 4 as of 05/22/2018 per pt.   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   PAD (peripheral artery disease) ([place])    Wears dentures     Family  History  Problem Relation Age of Onset   Colon cancer Neg Hx     Past Surgical History:  Procedure Laterality Date   A/V FISTULAGRAM N/A 08/11/2018   Procedure: A/V FISTULAGRAM - Right Arm;  Surgeon: Angelia Mould, MD;  Location: [facility];  Service: Cardiovascular;  Laterality: N/A;   A/V FISTULAGRAM N/A 09/07/2019   Procedure: A/V FISTULAGRAM - Right AVF;  Surgeon: Angelia Mould, MD;  Location: [facility];  Service: Cardiovascular;  Laterality: N/A;   ABDOMINAL AORTOGRAM W/LOWER EXTREMITY Right 07/03/2018   Procedure: ABDOMINAL AORTOGRAM W/LOWER EXTREMITY;  Surgeon: Waynetta Sandy, MD;  Location: [facility];  Service: Cardiovascular;  Laterality: Right;   ABDOMINAL AORTOGRAM W/LOWER EXTREMITY N/A 02/12/2020   Procedure: ABDOMINAL AORTOGRAM W/LOWER EXTREMITY;  Surgeon: Serafina Mitchell, MD;  Location: [facility];  Service: Cardiovascular;  Laterality: N/A;   AMPUTATION  08/27/2011   Procedure: AMPUTATION DIGIT;  Surgeon: Newt Minion, MD;  Location: [facility];  Service: Orthopedics;  Laterality: Left;  Left Foot 3rd toe Amputation MTP joint   AMPUTATION Right 07/11/2018   Procedure: AMPUTATION RIGHT GREAT TOE;  Surgeon: Angelia Mould, MD;  Location: [facility];  Service: Vascular;  Laterality: Right;   AMPUTATION Right 02/15/2020   Procedure: TRANSMETATARSAL AMPUTATION;   Surgeon: Newt Minion, MD;  Location: [facility];  Service: Orthopedics;  Laterality: Right;   amputation great toe  2009   left   AV FISTULA PLACEMENT Right 05/23/2018   Procedure: RADIAL- CEPHALIC ARTERIOVENOUS (AV) FISTULA CREATION RIGHT ARM AND LIGATION OF COMPETING BRANCH.;  Surgeon: Angelia Mould, MD;  Location: [facility];  Service: Vascular;  Laterality: Right;   [place] Right 10/01/2019   Procedure: FIRST STAGE [place];  Surgeon: Angelia Mould, MD;  Location: [facility];  Service: Vascular;  Laterality: Right;   [place] Right 11/13/2019   Procedure: RIGHT SECOND STAGE [place];  Surgeon: Rosetta Posner, MD;  Location: [facility];  Service: Vascular;  Laterality: Right;   COLONOSCOPY     cyst elbow  1999   right   FEMORAL-POPLITEAL BYPASS GRAFT Right 07/11/2018   Procedure: BYPASS GRAFT FEMORAL-POPLITEAL ARTERY;  Surgeon: Angelia Mould, MD;  Location: [facility];  Service: Vascular;  Laterality: Right;   FOOT SURGERY     both feet for something that was torn   IR FLUORO GUIDE CV LINE RIGHT  11/08/2019   IR US GUIDE VASC ACCESS RIGHT  11/08/2019   LIGATION OF ARTERIOVENOUS  FISTULA Right 10/01/2019   Procedure: LIGATION OF RIGHT RADIOCEPHALIC  ARTERIOVENOUS  FISTULA;  Surgeon: Angelia Mould, MD;  [facility];  Service: Vascular;  Laterality: Right;   PERIPHERAL VASCULAR BALLOON ANGIOPLASTY Right 02/12/2020   Procedure: PERIPHERAL VASCULAR BALLOON ANGIOPLASTY;  Surgeon: Serafina Mitchell, MD;  Location: [facility];  Service: Cardiovascular;  Laterality: Right;  peroneal   PERIPHERAL VASCULAR INTERVENTION Right 02/12/2020   Procedure: PERIPHERAL VASCULAR INTERVENTION;  Surgeon: Serafina Mitchell, MD;  Location: [facility];  Service: Cardiovascular;  Laterality: Right;  fem/pop bypass graft   TOE AMPUTATION  2010   left 2nd toe   UPPER GI ENDOSCOPY     Social History   Occupational  History   Not on file  Tobacco Use   Smoking status: Former    Pack years: 0.00    Types: Cigarettes    Quit date: 03/11/1996  Years since quitting: 24.3   Smokeless tobacco: Never  Vaping Use   Vaping Use: Never used  Substance and Sexual Activity   Alcohol use: No   Drug use: No   Sexual activity: Not Currently

## 2020-07-11 NOTE — Progress Notes (Signed)
Office Visit Note   Patient: Thomas Mcguire           Date of Birth: 1940/09/23           MRN: [id number] Visit Date: 07/11/2020              Requested by: Leanna Battles, [place],  [place] [address] PCP: Leanna Battles, MD  Chief Complaint  Patient presents with   Right Foot - Follow-up    02/15/20 right transmet amputation       HPI: Patient presents today for an unexpected visit.  He is status post right transmetatarsal amputation after vascular intervention in January.  With regards to that he is doing well.  For the last few weeks he has developed an ulceration under the first MTP joint of the left foot.  He is status post amputation of the first second and third toes on the side.  He also has had recent ABIs which showed noncompressible arteries.  He has had this ulcer debrided and was placed on antibiotics.  His wife comes in today because she feels that things have only gotten worse.  He denies any fever chills.  He does report some progressive pain in this area of the foot  Assessment & Plan: Visit Diagnoses:  1. History of transmetatarsal amputation of left foot ([place])     Plan: I had a discussion with the patient and his wife.  They may very well need some vascular intervention if that is possible and may need further amputation similar to what occurred in January.  I am concerned that he has been on antibiotics and is progressively gotten worse.  Patient has dialysis on Tuesdays Thursdays and Saturdays.  Patient would really like to meet with Dr. Sharol Given if all possible to discuss this further.  He is here this afternoon and they are willing to return.  Follow-Up Instructions: No follow-ups on file.   Ortho Exam  Patient is alert, oriented, no adenopathy, well-dressed, normal affect, normal respiratory effort. Left foot monophasic pulses by Doppler.  Ulcer beneath the first metatarsal head does probe deeply and has expanded delamination of the skin to  the dorsal and medial surface.  No ascending cellulitis.  No significant swelling cannot probe deeply on the plantar surface  Imaging: No results found. No images are attached to the encounter.  Labs: Lab Results  Component Value Date   HGBA1C 6.1 (H) 02/11/2020   HGBA1C 6.2 (H) 11/07/2019   HGBA1C 6.8 (H) 07/11/2018   CRP <0.8 10/14/2017   LABURIC 5.8 11/02/2017   REPTSTATUS 02/18/2020 FINAL 02/12/2020   REPTSTATUS 02/18/2020 FINAL 02/12/2020   CULT  02/12/2020    NO GROWTH 5 DAYS Performed at [facility], [place] [address] [place], [place] [address]    CULT  02/12/2020    NO GROWTH 5 DAYS Performed at [facility] [address], [place], [place] [address]      Lab Results  Component Value Date   ALBUMIN 2.6 (L) 02/20/2020   ALBUMIN 2.9 (L) 02/19/2020   ALBUMIN 2.5 (L) 02/18/2020    Lab Results  Component Value Date   MG 2.6 (H) 02/20/2020   MG 2.7 (H) 02/19/2020   MG 2.1 11/11/2019   No results found for: VD25OH  No results found for: PREALBUMIN CBC EXTENDED Latest Ref Rng & Units 05/30/2020 02/20/2020 02/19/2020  WBC 4.0 - 10.5 K/uL 10.5 12.8(H) 15.8(H)  RBC 4.22 - 5.81 MIL/uL 4.05(L) 3.47(L)  3.88(L)  HGB 13.0 - 17.0 g/dL 12.1(L) 9.5(L) 10.7(L)  HCT 39.0 - 52.0 % 38.8(L) 30.1(L) 33.4(L)  PLT 150 - 400 K/uL 89(L) 144(L) 143(L)  NEUTROABS 1.7 - 7.7 K/uL 2.3 4.0 4.6  LYMPHSABS 0.7 - 4.0 K/uL 7.5(H) 7.3(H) 9.4(H)     There is no height or weight on file to calculate BMI.  Orders:  Orders Placed This Encounter  Procedures   XR Foot Complete Left   No orders of the defined types were placed in this encounter.    Procedures: No procedures performed  Clinical Data: No additional findings.  ROS:  All other systems negative, except as noted in the HPI. Review of Systems  Objective: Vital Signs: There were no vitals taken for this visit.  Specialty Comments:  No specialty comments available.  PMFS History: Patient Active Problem List    Diagnosis Date Noted   Acute on chronic combined systolic and diastolic heart failure ([place]) 03/11/2020   End stage renal disease ([place]) 03/11/2020   Dependence on renal dialysis ([place]) 03/11/2020   Non-pressure chronic ulcer of other part of right foot limited to breakdown of skin ([place]) 03/11/2020   Pressure injury of sacral region, stage 2 ([place]) 03/11/2020   Primary insomnia 03/11/2020   Reactive depression 03/11/2020   Necrotic toes ([place]) 02/11/2020   Cellulitis and abscess of toe of right foot 02/10/2020   Cellulitis of right foot 02/10/2020   Bilateral hearing loss 11/26/2019   Excessive cerumen in both ear canals 11/26/2019   Fluid overload 11/07/2019   Cardiomegaly 11/07/2019   DM2 (diabetes mellitus, type 2) ([place]) 11/07/2019   Heart failure ([place]) 12/15/2018   CKD (chronic kidney disease) 10/06/2018   Amputation of great toe ([place]) 08/14/2018   PVD (peripheral vascular disease) ([place]) 07/11/2018   Degeneration of thoracic intervertebral disc 03/09/2018   Pain in thoracic spine 03/09/2018   B12 deficiency 30/16/0109   Folic acid deficiency 32/35/5732   Chronic lymphocytic leukemia (CLL), 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   Shortness of breath 11/07/2017   Anemia in chronic kidney disease 10/12/2017   Cellulitis of right lower limb 08/01/2017   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 Stage 4 as of 05/22/2018 per pt.    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   PAD (peripheral artery disease) ([place])    Wears dentures     Family History  Problem Relation Age of Onset   Colon cancer Neg Hx     Past Surgical History:  Procedure Laterality Date   A/V FISTULAGRAM N/A 08/11/2018   Procedure: A/V FISTULAGRAM - Right Arm;  Surgeon: Angelia Mould, MD;  Location: [facility];  Service: Cardiovascular;  Laterality: N/A;  A/V FISTULAGRAM N/A 09/07/2019   Procedure: A/V FISTULAGRAM - Right AVF;  Surgeon: Angelia Mould, MD;  Location: [facility];  Service: Cardiovascular;  Laterality: N/A;   ABDOMINAL AORTOGRAM W/LOWER EXTREMITY Right 07/03/2018   Procedure: ABDOMINAL AORTOGRAM W/LOWER EXTREMITY;  Surgeon: Waynetta Sandy, MD;  Location: [facility];  Service: Cardiovascular;  Laterality: Right;   ABDOMINAL AORTOGRAM W/LOWER EXTREMITY N/A 02/12/2020   Procedure: ABDOMINAL AORTOGRAM W/LOWER EXTREMITY;  Surgeon: Serafina Mitchell, MD;  Location: [facility];  Service: Cardiovascular;  Laterality: N/A;   AMPUTATION  08/27/2011   Procedure: AMPUTATION DIGIT;  Surgeon: Newt Minion, MD;  Location: [facility];  Service: Orthopedics;  Laterality: Left;  Left Foot 3rd toe Amputation MTP joint   AMPUTATION Right 07/11/2018   Procedure: AMPUTATION RIGHT GREAT TOE;  Surgeon: Angelia Mould, MD;  Location: [facility];  Service: Vascular;  Laterality: Right;   AMPUTATION Right 02/15/2020   Procedure: TRANSMETATARSAL AMPUTATION;  Surgeon: Newt Minion, MD;  Location: [facility];  Service: Orthopedics;  Laterality: Right;   amputation great toe  2009   left   AV FISTULA PLACEMENT Right 05/23/2018   Procedure: RADIAL- CEPHALIC ARTERIOVENOUS (AV) FISTULA CREATION RIGHT ARM AND LIGATION OF COMPETING BRANCH.;  Surgeon: Angelia Mould, MD;  Location: [facility];  Service: Vascular;  Laterality: Right;   [place] Right 10/01/2019   Procedure: FIRST STAGE [place];  Surgeon: Angelia Mould, MD;  Location: [facility];  Service: Vascular;  Laterality: Right;   [place] Right 11/13/2019   Procedure: RIGHT SECOND STAGE [place];  Surgeon: Rosetta Posner, MD;  Location: [facility];  Service: Vascular;  Laterality: Right;   COLONOSCOPY     cyst elbow  1999   right   FEMORAL-POPLITEAL BYPASS GRAFT Right 07/11/2018   Procedure: BYPASS GRAFT FEMORAL-POPLITEAL ARTERY;  Surgeon: Angelia Mould, MD;  Location: [facility];  Service: Vascular;  Laterality: Right;   FOOT SURGERY     both feet for something that was torn   IR FLUORO GUIDE CV LINE RIGHT  11/08/2019   IR US GUIDE VASC ACCESS RIGHT  11/08/2019   LIGATION OF ARTERIOVENOUS  FISTULA Right 10/01/2019   Procedure: LIGATION OF RIGHT RADIOCEPHALIC  ARTERIOVENOUS  FISTULA;  Surgeon: Angelia Mould, MD;  Location[facility];  Service: Vascular;  Laterality: Right;   PERIPHERAL VASCULAR BALLOON ANGIOPLASTY Right 02/12/2020   Procedure: PERIPHERAL VASCULAR BALLOON ANGIOPLASTY;  Surgeon: Serafina Mitchell, MD;  Location: [facility];  Service: Cardiovascular;  Laterality: Right;  peroneal   PERIPHERAL VASCULAR INTERVENTION Right 02/12/2020   Procedure: PERIPHERAL VASCULAR INTERVENTION;  Surgeon: Serafina Mitchell, MD;  Location: [facility];  Service: Cardiovascular;  Laterality: Right;  fem/pop bypass graft   TOE AMPUTATION  2010   left 2nd toe   UPPER GI ENDOSCOPY     Social History   Occupational History   Not on file  Tobacco Use   Smoking status: Former    Pack years: 0.00    Types: Cigarettes    Quit date: 03/11/1996    Years since quitting: 24.3   Smokeless tobacco: Never  Vaping Use   Vaping Use: Never used  Substance and Sexual Activity   Alcohol use: No   Drug use: No    Sexual activity: Not Currently

## 2020-07-15 ENCOUNTER — Ambulatory Visit: Payer: Medicare Other | Admitting: Orthopaedic Surgery

## 2020-07-16 ENCOUNTER — Ambulatory Visit: Payer: Medicare Other | Admitting: Orthopaedic Surgery

## 2020-07-16 ENCOUNTER — Other Ambulatory Visit (HOSPITAL_COMMUNITY): Payer: Self-pay | Admitting: Vascular Surgery

## 2020-07-16 DIAGNOSIS — I739 Peripheral vascular disease, unspecified: Secondary | ICD-10-CM

## 2020-07-17 ENCOUNTER — Other Ambulatory Visit: Payer: Self-pay

## 2020-07-17 ENCOUNTER — Ambulatory Visit (HOSPITAL_COMMUNITY)
Admission: RE | Admit: 2020-07-17 | Discharge: 2020-07-17 | Disposition: A | Discharge status: Home / Self Care | Payer: Medicare Other | Source: Ambulatory Visit | Attending: Vascular Surgery | Admitting: Vascular Surgery

## 2020-07-17 ENCOUNTER — Ambulatory Visit (INDEPENDENT_AMBULATORY_CARE_PROVIDER_SITE_OTHER): Payer: Medicare Other | Admitting: Vascular Surgery

## 2020-07-17 ENCOUNTER — Ambulatory Visit (INDEPENDENT_AMBULATORY_CARE_PROVIDER_SITE_OTHER): Payer: Medicare Other | Admitting: Orthopedic Surgery

## 2020-07-17 ENCOUNTER — Encounter: Payer: Self-pay | Admitting: Vascular Surgery

## 2020-07-17 ENCOUNTER — Encounter: Payer: Self-pay | Admitting: Orthopedic Surgery

## 2020-07-17 VITALS — BP 123/67 | HR 87 | Temp 98.9°F | Resp 20 | Ht 79.0 in | Wt 172.0 lb

## 2020-07-17 DIAGNOSIS — L97521 Non-pressure chronic ulcer of other part of left foot limited to breakdown of skin: Secondary | ICD-10-CM | POA: Diagnosis not present

## 2020-07-17 DIAGNOSIS — Z89432 Acquired absence of left foot: Secondary | ICD-10-CM | POA: Diagnosis not present

## 2020-07-17 DIAGNOSIS — I739 Peripheral vascular disease, unspecified: Secondary | ICD-10-CM

## 2020-07-17 NOTE — Progress Notes (Signed)
Patient is a 80 year old male who returns for follow-up today.  He was seen by Dr. Sharol Given on July 1 and found to have a new ulceration on his left foot.  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  He is on Plavix aspirin and a statin.  He is on chronic hemodialysis.  His hemodialysis day is Monday Wednesday Friday.  He does not know how the ulcer on his left foot started.  He states it was fine 1 day and then an ulcer the next day.   He was last seen March 2022.  At that point he had recently had an arteriogram with stenting of the tibioperoneal trunk up into an existing vein graft from a prior femoral-popliteal bypass by Dr. Trula Slade.  His femoropopliteal bypass was done by Dr. Scot Dock in June 2020.  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 Sharol Given.    Past Medical History:  Diagnosis Date   Anemia    low iron   Arthritis    Chronic renal insufficiency    CHECKED Q3MOS PER PT. Stage 4 as of 05/22/2018 per pt.   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   PAD (peripheral artery disease) ([place])    Wears dentures     Past Surgical History:  Procedure Laterality Date   A/V FISTULAGRAM N/A 08/11/2018   Procedure: A/V FISTULAGRAM - Right Arm;  Surgeon: Angelia Mould, MD;  Location: [facility];  Service: Cardiovascular;  Laterality: N/A;   A/V FISTULAGRAM N/A 09/07/2019   Procedure: A/V FISTULAGRAM - Right AVF;  Surgeon: Angelia Mould, MD;  Location: [facility];  Service: Cardiovascular;  Laterality: N/A;   ABDOMINAL AORTOGRAM W/LOWER EXTREMITY Right 07/03/2018   Procedure: ABDOMINAL AORTOGRAM W/LOWER EXTREMITY;  Surgeon: Waynetta Sandy, MD;  Location: [facility];  Service: Cardiovascular;  Laterality: Right;   ABDOMINAL AORTOGRAM W/LOWER  EXTREMITY N/A 02/12/2020   Procedure: ABDOMINAL AORTOGRAM W/LOWER EXTREMITY;  Surgeon: Serafina Mitchell, MD;  Location: [facility];  Service: Cardiovascular;  Laterality: N/A;   AMPUTATION  08/27/2011   Procedure: AMPUTATION DIGIT;  Surgeon: Newt Minion, MD;  Location: [facility];  Service: Orthopedics;  Laterality: Left;  Left Foot 3rd toe Amputation MTP joint   AMPUTATION Right 07/11/2018   Procedure: AMPUTATION RIGHT GREAT TOE;  Surgeon: Angelia Mould, MD;  Location: [facility];  Service: Vascular;  Laterality: Right;   AMPUTATION Right 02/15/2020   Procedure: TRANSMETATARSAL AMPUTATION;  Surgeon: Newt Minion, MD;  Location: [facility];  Service: Orthopedics;  Laterality: Right;   amputation great toe  2009   left   AV FISTULA PLACEMENT Right 05/23/2018   Procedure: RADIAL- CEPHALIC ARTERIOVENOUS (AV) FISTULA CREATION RIGHT ARM AND LIGATION OF COMPETING BRANCH.;  Surgeon: Angelia Mould, MD;  Location: [facility];  Service: Vascular;  Laterality: Right;   [place] Right 10/01/2019   Procedure: FIRST STAGE [place];  Surgeon: Angelia Mould, MD;  Location: [facility];  Service: Vascular;  Laterality: Right;   [place] Right 11/13/2019   Procedure: RIGHT SECOND STAGE [place];  Surgeon: Rosetta Posner, MD;  Location: [facility];  Service: Vascular;  Laterality: Right;  COLONOSCOPY     cyst elbow  1999   right   FEMORAL-POPLITEAL BYPASS GRAFT Right 07/11/2018   Procedure: BYPASS GRAFT FEMORAL-POPLITEAL ARTERY;  Surgeon: Angelia Mould, MD;  Location: [facility];  Service: Vascular;  Laterality: Right;   FOOT SURGERY     both feet for something that was torn   IR FLUORO GUIDE CV LINE RIGHT  11/08/2019   IR US GUIDE VASC ACCESS RIGHT  11/08/2019   LIGATION OF ARTERIOVENOUS  FISTULA Right 10/01/2019   Procedure: LIGATION OF RIGHT RADIOCEPHALIC  ARTERIOVENOUS  FISTULA;  Surgeon: Angelia Mould, MD;  Location: [facility];  Service: Vascular;  Laterality: Right;   PERIPHERAL VASCULAR BALLOON ANGIOPLASTY Right 02/12/2020   Procedure: PERIPHERAL VASCULAR BALLOON ANGIOPLASTY;  Surgeon: Serafina Mitchell, MD;  Location: [facility];  Service: Cardiovascular;  Laterality: Right;  peroneal   PERIPHERAL VASCULAR INTERVENTION Right 02/12/2020   Procedure: PERIPHERAL VASCULAR INTERVENTION;  Surgeon: Serafina Mitchell, MD;  Location: [facility];  Service: Cardiovascular;  Laterality: Right;  fem/pop bypass graft   TOE AMPUTATION  2010   left 2nd toe   UPPER GI ENDOSCOPY      Current Outpatient Medications on File Prior to Visit  Medication Sig Dispense Refill   Accu-Chek FastClix Lancets MISC Use one lancet 5 times every day  DX:  250.00     aspirin EC 81 MG EC tablet Take 1 tablet (81 mg total) by mouth daily. Swallow whole. 30 tablet 11   B Complex-C-Folic Acid (NEPHRO-VITE RX PO) Take 1 tablet by mouth at bedtime.     Blood Glucose Calibration (ACCU-CHEK AVIVA) SOLN U.A.D TO CALIBRATE METER     Blood Glucose Monitoring Suppl (ACCU-CHEK AVIVA PLUS) w/Device KIT Use meter to check blood sugar     COVID-19 mRNA Vac-TriS, Pfizer, (PFIZER-BIONT COVID-19 VAC-TRIS) SUSP injection Inject into the muscle. 0.3 mL 0   cyanocobalamin (,VITAMIN B-12,) 1000 MCG/ML injection Vitamin B12 injection: 1000 mcg (1 mL) by subcu injection once daily for 7 days; then once weekly for 4 weeks; then once monthly thereafter.  7 07/15/2010 (Patient taking differently: Inject 1,000 mcg into the muscle every 30 (thirty) days.) 12 mL 1   doxycycline (VIBRA-TABS) 100 MG tablet Take 1 tablet (100 mg total) by mouth 2 (two) times daily. 20 tablet 0   folic acid (FOLVITE) 1 MG tablet TAKE 1 TABLET BY MOUTH EVERY DAY 90 tablet 1   glucose blood (ACCU-CHEK AVIVA PLUS) test strip Check blood sugar 5 times daily (dx E11.49)     insulin NPH Human (NOVOLIN N) 100 UNIT/ML injection Inject 0.5 mLs (50 Units total) into the skin daily before breakfast.  (Patient taking differently: Inject 50 Units into the skin daily as needed (high blood sugar).) 10 mL 11   Insulin Syringe-Needle U-100 31G X 5/16" 0.5 ML MISC Use one new syringe 6 times daily for insulin DX E11.49     Lancets Misc. (ACCU-CHEK FASTCLIX LANCET) KIT u.a.d to test BS 5 times daily     midodrine (PROAMATINE) 10 MG tablet Take 10 mg by mouth 3 (three) times a week. Tues, Thurs, Sat     nitroGLYCERIN (NITRODUR - DOSED IN MG/24 HR) 0.2 mg/hr patch Place 1 patch (0.2 mg total) onto the skin daily. 30 patch 12   oxyCODONE (OXY IR/ROXICODONE) 5 MG immediate release tablet Take 1-2 tablets (5-10 mg total) by mouth every 4 (four) hours as needed for moderate pain (pain score 4-6). 15 tablet 0  pentoxifylline (TRENTAL) 400 MG CR tablet Take 1 tablet (400 mg total) by mouth 3 (three) times daily with meals. 90 tablet 3   pravastatin (PRAVACHOL) 40 MG tablet Take 40 mg by mouth at bedtime.     sucroferric oxyhydroxide (VELPHORO) 500 MG chewable tablet Chew 1 tablet (500 mg total) by mouth 3 (three) times daily with meals. 90 tablet    traZODone (DESYREL) 50 MG tablet Take 50 mg by mouth at bedtime.     calcitRIOL (ROCALTROL) 0.25 MCG capsule Take 0.25 mcg by mouth every Monday, Wednesday, and Friday.  (Patient not taking: No sig reported)  5   clopidogrel (PLAVIX) 75 MG tablet Take 1 tablet (75 mg total) by mouth daily. (Patient not taking: No sig reported)     melatonin 1 MG TABS tablet Take 3 mg by mouth at bedtime as needed (sleep). (Patient not taking: No sig reported)     No current facility-administered medications on file prior to visit.    Physical exam:   Vitals:   07/17/20 1050  BP: 123/67  Pulse: 87  Resp: 20  Temp: 98.9 F (37.2 C)  SpO2: 98%  Weight: 172 lb (78 kg)  Height: '6\' 7"'  (2.007 m)    Extremities: No palpable left femoral pulse but the femoral vessel is easily palpable and bone like quality suggesting severe calcification.  He has a 2+ right femoral pulse.  He  has absent popliteal and pedal pulses in both legs.  He has a well-healed transmetatarsal amputation of the right foot.  Skin:         Data: Patient had bilateral ABIs performed today which were noncompressible bilaterally.  He has had amputation of multiple toes so toe pressure was not available.  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  Plan: Patient will be scheduled for aortogram lower extremity runoff next Thursday with Dr. Carlis Abbott to avoid his dialysis today.  Possible intervention at that point.  Procedure details risk benefits possible complications were discussed with the patient today.  Ruta Hinds, MD [facility] Office: [phone]

## 2020-07-17 NOTE — Progress Notes (Signed)
Office Visit Note   Patient: Thomas Mcguire           Date of Birth: [birth year]           MRN: [id number] Visit Date: 07/17/2020              Requested by: Leanna Battles, MD [address] [place],  [place] [address] PCP: Leanna Battles, MD  Chief Complaint  Patient presents with   Left Foot - Follow-up      HPI: Patient is a 80 year old gentleman who is seen in follow-up for ischemic left transmetatarsal amputation.  Patient's foot was cold to the touch we did start him on a nitroglycerin patch and Trental.  Patient has completed his antibiotics.  He states his foot is less cold less painful.  He did see vascular vein surgery and is scheduled for an arteriogram Thursday.  Assessment & Plan: Visit Diagnoses:  1. History of transmetatarsal amputation of left foot ([place])   2. PVD (peripheral vascular disease) ([place])   3. Non-pressure chronic ulcer of other part of left foot limited to breakdown of skin ([place])     Plan: Recommended continue with the above treatment.  I feel with increased circulation the ischemic changes to the transmetatarsal amputation should resolve uneventfully.  Follow-Up Instructions: Return in about 3 weeks (around 08/07/2020).   Ortho Exam  Patient is alert, oriented, no adenopathy, well-dressed, normal affect, normal respiratory effort. Examination patient's foot is no longer tender to touch the foot is warm and not cold.  The ischemic ulcer is improving.  There is no ascending cellulitis no odor no drainage no signs of infection.  Imaging: VAS [place] ABI WITH/WO TBI  Result Date: 07/17/2020  LOWER EXTREMITY DOPPLER STUDY Patient Name:  Thomas Mcguire  Date of Exam:   07/17/2020 Medical Rec #: [id number]          Accession #:    [id number] Date of Birth: Feb 13, 1940          Patient Gender: M Patient Age:   80Y Exam Location:  [facility] Procedure:      VAS [place] ABI WITH/WO TBI Referring Phys: [place]  --------------------------------------------------------------------------------  Indications: Ulceration, and peripheral artery disease. High Risk         Hypertension, hyperlipidemia, Diabetes, past history of Factors:          smoking.  Vascular Interventions: 02/12/2020: Stent of right femoral-popliteal artery                         (bypass graft). Stent of right tibioperoneal trunk.                         Balloon angioplasty of right peroneal artery.                          07/11/18: Right femoral to BK popliteal bypass graft with                         GSV.                          Right toes amputated. Left digits amputation. Comparison Study: 03/19/2020: Rt ABI [place]; Lt ABI [place]. Performing Technologist: Ivan Croft  Examination Guidelines: A complete evaluation includes at minimum, Doppler waveform signals and systolic blood pressure  reading at the level of bilateral brachial, anterior tibial, and posterior tibial arteries, when vessel segments are accessible. Bilateral testing is considered an integral part of a complete examination. Photoelectric Plethysmograph (PPG) waveforms and toe systolic pressure readings are included as required and additional duplex testing as needed. Limited examinations for reoccurring indications may be performed as noted.  ABI Findings: +---------+------------------+-----+----------+--------------+ Right    Rt Pressure (mmHg)IndexWaveform  Comment        +---------+------------------+-----+----------+--------------+ Brachial                                  Restricted arm +---------+------------------+-----+----------+--------------+ PTA      250               2.21 biphasic                 +---------+------------------+-----+----------+--------------+ DP       250               2.21 monophasic               +---------+------------------+-----+----------+--------------+ Great Toe                                 amputated       +---------+------------------+-----+----------+--------------+ +---------+------------------+-----+----------+---------+ Left     Lt Pressure (mmHg)IndexWaveform  Comment   +---------+------------------+-----+----------+---------+ Brachial 113                                        +---------+------------------+-----+----------+---------+ PTA      250               2.21 monophasic          +---------+------------------+-----+----------+---------+ DP       250               2.21 monophasic          +---------+------------------+-----+----------+---------+ Great Toe                                 amputated +---------+------------------+-----+----------+---------+ +-------+-----------+-----------+------------+------------+ ABI/TBIToday's ABIToday's TBIPrevious ABIPrevious TBI +-------+-----------+-----------+------------+------------+ Right  [place]         amputated  [place]          amputated    +-------+-----------+-----------+------------+------------+ Left   [place]         amputated  [place]          amputated    +-------+-----------+-----------+------------+------------+   Summary: Right: Resting right ankle-brachial index indicates noncompressible right lower extremity arteries. Left: Resting left ankle-brachial index indicates noncompressible left lower extremity arteries.  *See table(s) above for measurements and observations.  Electronically signed by Ruta Hinds MD on 07/17/2020 at 12:32:19 PM.    Final    No images are attached to the encounter.  Labs: Lab Results  Component Value Date   HGBA1C 6.1 (H) 02/11/2020   HGBA1C 6.2 (H) 11/07/2019   HGBA1C 6.8 (H) 07/11/2018   CRP <0.8 10/14/2017   LABURIC 5.8 11/02/2017   REPTSTATUS 02/18/2020 FINAL 02/12/2020   REPTSTATUS 02/18/2020 FINAL 02/12/2020   CULT  02/12/2020    NO GROWTH 5 DAYS Performed at [facility], [place] [address], [place], [place] [address]    CULT  02/12/2020  NO GROWTH 5 DAYS Performed  at [facility], [place] [address] [place], [place] [address]      Lab Results  Component Value Date   ALBUMIN 2.6 (L) 02/20/2020   ALBUMIN 2.9 (L) 02/19/2020   ALBUMIN 2.5 (L) 02/18/2020    Lab Results  Component Value Date   MG 2.6 (H) 02/20/2020   MG 2.7 (H) 02/19/2020   MG 2.1 11/11/2019   No results found for: VD25OH  No results found for: PREALBUMIN CBC EXTENDED Latest Ref Rng & Units 05/30/2020 02/20/2020 02/19/2020  WBC 4.0 - 10.5 K/uL 10.5 12.8(H) 15.8(H)  RBC 4.22 - 5.81 MIL/uL 4.05(L) 3.47(L) 3.88(L)  HGB 13.0 - 17.0 g/dL 12.1(L) 9.5(L) 10.7(L)  HCT 39.0 - 52.0 % 38.8(L) 30.1(L) 33.4(L)  PLT 150 - 400 K/uL 89(L) 144(L) 143(L)  NEUTROABS 1.7 - 7.7 K/uL 2.3 4.0 4.6  LYMPHSABS 0.7 - 4.0 K/uL 7.5(H) 7.3(H) 9.4(H)     There is no height or weight on file to calculate BMI.  Orders:  No orders of the defined types were placed in this encounter.  No orders of the defined types were placed in this encounter.    Procedures: No procedures performed  Clinical Data: No additional findings.  ROS:  All other systems negative, except as noted in the HPI. Review of Systems  Objective: Vital Signs: There were no vitals taken for this visit.  Specialty Comments:  No specialty comments available.  PMFS History: Patient Active Problem List   Diagnosis Date Noted   Acute on chronic combined systolic and diastolic heart failure ([place]) 03/11/2020   End stage renal disease ([place]) 03/11/2020   Dependence on renal dialysis ([place]) 03/11/2020   Non-pressure chronic ulcer of other part of right foot limited to breakdown of skin ([place]) 03/11/2020   Pressure injury of sacral region, stage 2 ([place]) 03/11/2020   Primary insomnia 03/11/2020   Reactive depression 03/11/2020   Necrotic toes ([place]) 02/11/2020   Cellulitis and abscess of toe of right foot 02/10/2020   Cellulitis of right foot 02/10/2020   Bilateral hearing loss 11/26/2019   Excessive cerumen in both ear canals  11/26/2019   Fluid overload 11/07/2019   Cardiomegaly 11/07/2019   DM2 (diabetes mellitus, type 2) ([place]) 11/07/2019   Heart failure ([place]) 12/15/2018   CKD (chronic kidney disease) 10/06/2018   Amputation of great toe ([place]) 08/14/2018   PVD (peripheral vascular disease) ([place]) 07/11/2018   Degeneration of thoracic intervertebral disc 03/09/2018   Pain in thoracic spine 03/09/2018   B12 deficiency 33/54/5625   Folic acid deficiency 63/89/3734   Chronic lymphocytic leukemia (CLL), 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   Shortness of breath 11/07/2017   Anemia in chronic kidney disease 10/12/2017   Cellulitis of right lower limb 08/01/2017   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 Stage 4 as of 05/22/2018 per pt.   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   PAD (peripheral artery disease) ([place])    Wears dentures     Family History  Problem Relation Age of Onset   Colon cancer Neg Hx     Past Surgical History:  Procedure Laterality Date   A/V FISTULAGRAM N/A 08/11/2018   Procedure: A/V FISTULAGRAM - Right Arm;  Surgeon: Angelia Mould, MD;  Location: [facility];  Service: Cardiovascular;  Laterality: N/A;   A/V FISTULAGRAM N/A 09/07/2019   Procedure: A/V  FISTULAGRAM - Right AVF;  Surgeon: Angelia Mould, MD;  Location: [facility];  Service: Cardiovascular;  Laterality: N/A;   ABDOMINAL AORTOGRAM W/LOWER EXTREMITY Right 07/03/2018   Procedure: ABDOMINAL AORTOGRAM W/LOWER EXTREMITY;  Surgeon: Waynetta Sandy, MD;  Location: [facility];  Service: Cardiovascular;  Laterality: Right;   ABDOMINAL AORTOGRAM W/LOWER EXTREMITY N/A 02/12/2020   Procedure: ABDOMINAL AORTOGRAM W/LOWER EXTREMITY;  Surgeon: Serafina Mitchell, MD;  Location: [facility];  Service: Cardiovascular;  Laterality: N/A;   AMPUTATION  08/27/2011   Procedure: AMPUTATION DIGIT;  Surgeon: Newt Minion, MD;  Location: [facility];  Service: Orthopedics;  Laterality: Left;  Left Foot 3rd toe Amputation MTP joint   AMPUTATION Right 07/11/2018   Procedure: AMPUTATION RIGHT GREAT TOE;  Surgeon: Angelia Mould, MD;  Location: [facility];  Service: Vascular;  Laterality: Right;   AMPUTATION Right 02/15/2020   Procedure: TRANSMETATARSAL AMPUTATION;  Surgeon: Newt Minion, MD;  Location: [facility];  Service: Orthopedics;  Laterality: Right;   amputation great toe  2009   left   AV FISTULA PLACEMENT Right 05/23/2018   Procedure: RADIAL- CEPHALIC ARTERIOVENOUS (AV) FISTULA CREATION RIGHT ARM AND LIGATION OF COMPETING BRANCH.;  Surgeon: Angelia Mould, MD;  Location: [facility];  Service: Vascular;  Laterality: Right;   [place] Right 10/01/2019   Procedure: FIRST STAGE [employer];  Surgeon: Angelia Mould, MD;  Location: [facility];  Service: Vascular;  Laterality: Right;   [place] Right 11/13/2019   Procedure: RIGHT SECOND STAGE [place];  Surgeon: Rosetta Posner, MD;  Location: [facility];  Service: Vascular;  Laterality: Right;   COLONOSCOPY     cyst elbow  1999   right   FEMORAL-POPLITEAL BYPASS GRAFT Right 07/11/2018   Procedure: BYPASS GRAFT FEMORAL-POPLITEAL ARTERY;  Surgeon: Angelia Mould, MD;  Location: [facility];  Service: Vascular;  Laterality: Right;   FOOT SURGERY     both feet for something that was torn   IR FLUORO GUIDE CV LINE RIGHT  11/08/2019   IR US GUIDE VASC ACCESS RIGHT  11/08/2019   LIGATION OF ARTERIOVENOUS  FISTULA Right 10/01/2019   Procedure: LIGATION OF RIGHT RADIOCEPHALIC  ARTERIOVENOUS  FISTULA;  Surgeon: Angelia Mould, MD;  Location: [facility];  Service: Vascular;  Laterality: Right;   PERIPHERAL VASCULAR BALLOON ANGIOPLASTY Right 02/12/2020   Procedure: PERIPHERAL VASCULAR BALLOON ANGIOPLASTY;  Surgeon: Serafina Mitchell, MD;  Location: [facility];  Service: Cardiovascular;  Laterality: Right;  peroneal   PERIPHERAL VASCULAR INTERVENTION Right 02/12/2020   Procedure: PERIPHERAL VASCULAR INTERVENTION;  Surgeon: Serafina Mitchell, MD;  Location: [facility];  Service: Cardiovascular;  Laterality: Right;  fem/pop bypass graft   TOE AMPUTATION  2010  left 2nd toe   UPPER GI ENDOSCOPY     Social History   Occupational History   Not on file  Tobacco Use   Smoking status: Former    Pack years: 0.00    Types: Cigarettes    Quit date: 03/11/1996    Years since quitting: 24.3   Smokeless tobacco: Never  Vaping Use   Vaping Use: Never used  Substance and Sexual Activity   Alcohol use: No   Drug use: No   Sexual activity: Not Currently

## 2020-07-24 ENCOUNTER — Ambulatory Visit (HOSPITAL_COMMUNITY)
Admission: RE | Admit: 2020-07-24 | Discharge: 2020-07-24 | Disposition: A | Discharge status: Home / Self Care | Payer: Medicare Other | Attending: Vascular Surgery | Admitting: Vascular Surgery

## 2020-07-24 ENCOUNTER — Ambulatory Visit (HOSPITAL_BASED_OUTPATIENT_CLINIC_OR_DEPARTMENT_OTHER): Payer: Medicare Other

## 2020-07-24 ENCOUNTER — Other Ambulatory Visit: Payer: Self-pay

## 2020-07-24 ENCOUNTER — Encounter (HOSPITAL_COMMUNITY)
Admission: RE | Disposition: A | Discharge status: Home / Self Care | Payer: Self-pay | Source: Home / Self Care | Attending: Vascular Surgery

## 2020-07-24 DIAGNOSIS — E11621 Type 2 diabetes mellitus with foot ulcer: Secondary | ICD-10-CM | POA: Diagnosis not present

## 2020-07-24 DIAGNOSIS — Z794 Long term (current) use of insulin: Secondary | ICD-10-CM | POA: Insufficient documentation

## 2020-07-24 DIAGNOSIS — Z7982 Long term (current) use of aspirin: Secondary | ICD-10-CM | POA: Diagnosis not present

## 2020-07-24 DIAGNOSIS — Z01810 Encounter for preprocedural cardiovascular examination: Secondary | ICD-10-CM

## 2020-07-24 DIAGNOSIS — L97529 Non-pressure chronic ulcer of other part of left foot with unspecified severity: Secondary | ICD-10-CM | POA: Diagnosis not present

## 2020-07-24 DIAGNOSIS — I120 Hypertensive chronic kidney disease with stage 5 chronic kidney disease or end stage renal disease: Secondary | ICD-10-CM | POA: Insufficient documentation

## 2020-07-24 DIAGNOSIS — Z7902 Long term (current) use of antithrombotics/antiplatelets: Secondary | ICD-10-CM | POA: Diagnosis not present

## 2020-07-24 DIAGNOSIS — N186 End stage renal disease: Secondary | ICD-10-CM | POA: Insufficient documentation

## 2020-07-24 DIAGNOSIS — Z992 Dependence on renal dialysis: Secondary | ICD-10-CM | POA: Insufficient documentation

## 2020-07-24 DIAGNOSIS — I70245 Atherosclerosis of native arteries of left leg with ulceration of other part of foot: Secondary | ICD-10-CM

## 2020-07-24 DIAGNOSIS — E1122 Type 2 diabetes mellitus with diabetic chronic kidney disease: Secondary | ICD-10-CM | POA: Insufficient documentation

## 2020-07-24 DIAGNOSIS — Z79899 Other long term (current) drug therapy: Secondary | ICD-10-CM | POA: Diagnosis not present

## 2020-07-24 DIAGNOSIS — E1151 Type 2 diabetes mellitus with diabetic peripheral angiopathy without gangrene: Secondary | ICD-10-CM | POA: Insufficient documentation

## 2020-07-24 HISTORY — PX: ABDOMINAL AORTOGRAM W/LOWER EXTREMITY: CATH118223

## 2020-07-24 LAB — POCT I-STAT, CHEM 8
BUN: 59 mg/dL — ABNORMAL HIGH (ref 8–23)
Calcium, Ion: 1.13 mmol/L — ABNORMAL LOW (ref 1.15–1.40)
Chloride: 96 mmol/L — ABNORMAL LOW (ref 98–111)
Creatinine, Ser: 5.8 mg/dL — ABNORMAL HIGH (ref 0.61–1.24)
Glucose, Bld: 100 mg/dL — ABNORMAL HIGH (ref 70–99)
HCT: 42 % (ref 39.0–52.0)
Hemoglobin: 14.3 g/dL (ref 13.0–17.0)
Potassium: 4.4 mmol/L (ref 3.5–5.1)
Sodium: 137 mmol/L (ref 135–145)
TCO2: 33 mmol/L — ABNORMAL HIGH (ref 22–32)

## 2020-07-24 LAB — POCT ACTIVATED CLOTTING TIME
Activated Clotting Time: 214 seconds
Activated Clotting Time: 167 seconds

## 2020-07-24 LAB — GLUCOSE, CAPILLARY
Glucose-Capillary: 95 mg/dL (ref 70–99)
Glucose-Capillary: 93 mg/dL (ref 70–99)

## 2020-07-24 SURGERY — ABDOMINAL AORTOGRAM W/LOWER EXTREMITY
Anesthesia: LOCAL | Laterality: Left

## 2020-07-24 MED ORDER — FENTANYL CITRATE (PF) 100 MCG/2ML IJ SOLN
INTRAMUSCULAR | Status: AC
  Filled 2020-07-24: qty 2

## 2020-07-24 MED ORDER — FENTANYL CITRATE (PF) 100 MCG/2ML IJ SOLN
INTRAMUSCULAR | Status: DC | PRN
  Administered 2020-07-24: 25 ug via INTRAVENOUS

## 2020-07-24 MED ORDER — HYDRALAZINE HCL 20 MG/ML IJ SOLN: 5.0000 mg | INTRAMUSCULAR | Status: DC | PRN

## 2020-07-24 MED ORDER — SODIUM CHLORIDE 0.9% FLUSH: 3.0000 mL | INTRAVENOUS | Status: DC | PRN

## 2020-07-24 MED ORDER — MIDAZOLAM HCL 2 MG/2ML IJ SOLN
INTRAMUSCULAR | Status: AC
  Filled 2020-07-24: qty 2

## 2020-07-24 MED ORDER — MIDAZOLAM HCL 2 MG/2ML IJ SOLN
INTRAMUSCULAR | Status: DC | PRN
  Administered 2020-07-24: 1 mg via INTRAVENOUS

## 2020-07-24 MED ORDER — ONDANSETRON HCL 4 MG/2ML IJ SOLN: 4.0000 mg | Freq: Four times a day (QID) | INTRAMUSCULAR | Status: DC | PRN

## 2020-07-24 MED ORDER — LIDOCAINE HCL (PF) 1 % IJ SOLN
INTRAMUSCULAR | Status: AC
  Filled 2020-07-24: qty 30

## 2020-07-24 MED ORDER — HEPARIN (PORCINE) IN NACL 1000-0.9 UT/500ML-% IV SOLN
INTRAVENOUS | Status: DC | PRN
  Administered 2020-07-24 (×2): 500 mL

## 2020-07-24 MED ORDER — HEPARIN SODIUM (PORCINE) 1000 UNIT/ML IJ SOLN
INTRAMUSCULAR | Status: AC
  Filled 2020-07-24: qty 1

## 2020-07-24 MED ORDER — SODIUM CHLORIDE 0.9 % IV SOLN: 250.0000 mL | INTRAVENOUS | Status: DC | PRN

## 2020-07-24 MED ORDER — SODIUM CHLORIDE 0.9% FLUSH: 3.0000 mL | Freq: Two times a day (BID) | INTRAVENOUS | Status: DC

## 2020-07-24 MED ORDER — HEPARIN (PORCINE) IN NACL 1000-0.9 UT/500ML-% IV SOLN
INTRAVENOUS | Status: AC
  Filled 2020-07-24: qty 1000

## 2020-07-24 MED ORDER — LABETALOL HCL 5 MG/ML IV SOLN
10.0000 mg | INTRAVENOUS | Status: DC | PRN
Start: 2020-07-24 — End: 2020-07-25

## 2020-07-24 MED ORDER — LIDOCAINE HCL (PF) 1 % IJ SOLN
INTRAMUSCULAR | Status: DC | PRN
  Administered 2020-07-24: 12 mL

## 2020-07-24 MED ORDER — HEPARIN SODIUM (PORCINE) 1000 UNIT/ML IJ SOLN
INTRAMUSCULAR | Status: DC | PRN
  Administered 2020-07-24: 8000 [IU] via INTRAVENOUS

## 2020-07-24 MED ORDER — IODIXANOL 320 MG/ML IV SOLN
INTRAVENOUS | Status: DC | PRN
  Administered 2020-07-24: 70 mL

## 2020-07-24 MED ORDER — ACETAMINOPHEN 325 MG PO TABS: 650.0000 mg | ORAL_TABLET | ORAL | Status: DC | PRN

## 2020-07-24 SURGICAL SUPPLY — 14 items
CATH NAVICROSS ANGLED 135CM (MICROCATHETER) ×2 IMPLANT
CATH OMNI FLUSH 5F 65CM (CATHETERS) ×2 IMPLANT
CATH QUICKCROSS .035X135CM (MICROCATHETER) ×2 IMPLANT
DEVICE CONTINUOUS FLUSH (MISCELLANEOUS) ×2 IMPLANT
GLIDEWIRE ADV .035X260CM (WIRE) ×2 IMPLANT
KIT MICROPUNCTURE NIT STIFF (SHEATH) ×2 IMPLANT
KIT PV (KITS) ×2 IMPLANT
SHEATH FLEX ANSEL ANG 6F 45CM (SHEATH) ×2 IMPLANT
SHEATH PINNACLE 5F 10CM (SHEATH) ×2 IMPLANT
SHEATH PROBE COVER 6X72 (BAG) ×2 IMPLANT
SYR MEDRAD MARK V 150ML (SYRINGE) ×2 IMPLANT
TRANSDUCER W/STOPCOCK (MISCELLANEOUS) ×2 IMPLANT
TRAY PV CATH (CUSTOM PROCEDURE TRAY) ×2 IMPLANT
WIRE BENTSON .035X145CM (WIRE) ×2 IMPLANT

## 2020-07-24 NOTE — H&P (Signed)
History and Physical Interval Note:  07/24/2020 12:30 PM  Thomas Mcguire  has presented today for surgery, with the diagnosis of peripheral vascular disease.  The various methods of treatment have been discussed with the patient and family. After consideration of risks, benefits and other options for treatment, the patient has consented to  Procedure(s): ABDOMINAL AORTOGRAM W/LOWER EXTREMITY (N/A) as a surgical intervention.  The patient's history has been reviewed, patient examined, no change in status, stable for surgery.  I have reviewed the patient's chart and labs.  Questions were answered to the patient's satisfaction.    Aortogram, left leg arteriogram, left foot tissue loss.  Thomas Mcguire  Patient is a 80 year old male who returns for follow-up today.  He was seen by Dr. Sharol Given on July 1 and found to have a new ulceration on his left foot.  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  He is on Plavix aspirin and a statin.  He is on chronic hemodialysis.  His hemodialysis day is Monday Wednesday Friday.  He does not know how the ulcer on his left foot started.  He states it was fine 1 day and then an ulcer the next day.     He was last seen March 2022.  At that point he had recently had an arteriogram with stenting of the tibioperoneal trunk up into an existing vein graft from a prior femoral-popliteal bypass by Dr. Trula Slade.  His femoropopliteal bypass was done by Dr. Scot Dock in June 2020.  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 Sharol Given.           Past Medical History:  Diagnosis Date   Anemia      low iron   Arthritis     Chronic renal insufficiency      CHECKED Q3MOS PER PT. Stage 4 as of 05/22/2018 per pt.   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   PAD  (peripheral artery disease) ([place])     Wears dentures             Past Surgical History:  Procedure Laterality Date   A/V FISTULAGRAM N/A 08/11/2018    Procedure: A/V FISTULAGRAM - Right Arm;  Surgeon: Angelia Mould, MD;  Location: [facility];  Service: Cardiovascular;  Laterality: N/A;   A/V FISTULAGRAM N/A 09/07/2019    Procedure: A/V FISTULAGRAM - Right AVF;  Surgeon: Angelia Mould, MD;  Location: [facility];  Service: Cardiovascular;  Laterality: N/A;   ABDOMINAL AORTOGRAM W/LOWER EXTREMITY Right 07/03/2018    Procedure: ABDOMINAL AORTOGRAM W/LOWER EXTREMITY;  Surgeon: Waynetta Sandy, MD;  Location: [facility];  Service: Cardiovascular;  Laterality: Right;   ABDOMINAL AORTOGRAM W/LOWER EXTREMITY N/A 02/12/2020    Procedure: ABDOMINAL AORTOGRAM W/LOWER EXTREMITY;  Surgeon: Serafina [facility], MD;  Location: [facility];  Service: Cardiovascular;  Laterality: N/A;   AMPUTATION   08/27/2011    Procedure: AMPUTATION DIGIT;  Surgeon: Newt Minion, MD;  Location: [facility];  Service: Orthopedics;  Laterality: Left;  Left Foot 3rd toe Amputation MTP joint   AMPUTATION Right 07/11/2018    Procedure: AMPUTATION RIGHT GREAT TOE;  Surgeon: Angelia Mould, MD;  Location: [facility];  Service: Vascular;  Laterality: Right;   AMPUTATION Right 02/15/2020    Procedure: TRANSMETATARSAL AMPUTATION;  Surgeon: Newt Minion, MD;  Location: [facility];  Service: Orthopedics;  Laterality: Right;   amputation great toe   2009    left   AV FISTULA PLACEMENT Right 05/23/2018    Procedure: RADIAL- CEPHALIC ARTERIOVENOUS (AV) FISTULA CREATION RIGHT ARM AND LIGATION OF COMPETING BRANCH.;  Surgeon: Angelia Mould, MD;  Location: [facility];  Service: Vascular;  Laterality: Right;   [place] Right 10/01/2019    Procedure: FIRST STAGE [place];  Surgeon: Angelia Mould, MD;  Location: [facility];  Service: Vascular;  Laterality:  Right;   [place] Right 11/13/2019    Procedure: RIGHT SECOND STAGE [place];  Surgeon: Rosetta Posner, MD;  Location: [facility];  Service: Vascular;  Laterality: Right;   COLONOSCOPY       cyst elbow   1999    right   FEMORAL-POPLITEAL BYPASS GRAFT Right 07/11/2018    Procedure: BYPASS GRAFT FEMORAL-POPLITEAL ARTERY;  Surgeon: Angelia Mould, MD;  Location: [facility];  Service: Vascular;  Laterality: Right;   FOOT SURGERY        both feet for something that was torn   IR FLUORO GUIDE CV LINE RIGHT   11/08/2019   IR US GUIDE VASC ACCESS RIGHT   11/08/2019   LIGATION OF ARTERIOVENOUS  FISTULA Right 10/01/2019    Procedure: LIGATION OF RIGHT RADIOCEPHALIC  ARTERIOVENOUS  FISTULA;  Surgeon: Angelia Mould, MD;  Location: [facility];  Service: Vascular;  Laterality: Right;   PERIPHERAL VASCULAR BALLOON ANGIOPLASTY Right 02/12/2020    Procedure: PERIPHERAL VASCULAR BALLOON ANGIOPLASTY;  Surgeon: Ser[facility], MD;  Location: [facility];  Service: Cardiovascular;  Laterality: Right;  peroneal   PERIPHERAL VASCULAR INTERVENTION Right 02/12/2020    Procedure: PERIPHERAL VASCULAR INTERVENTION;  Surgeon: Ser[facility], MD;  Location: [facility];  Service: Cardiovascular;  Laterality: Right;  fem/pop bypass graft   TOE AMPUTATION   2010    left 2nd toe   UPPER GI ENDOSCOPY                Current Outpatient Medications on File Prior to Visit  Medication Sig Dispense Refill   Accu-Chek FastClix Lancets MISC Use one lancet 5 times every day  DX:  250.00       aspirin EC 81 MG EC tablet Take 1 tablet (81 mg total) by mouth daily. Swallow whole. 30 tablet 11   B Complex-C-Folic Acid (NEPHRO-VITE RX PO) Take 1 tablet by mouth at bedtime.       Blood Glucose Calibration (ACCU-CHEK AVIVA) SOLN U.A.D TO CALIBRATE METER       Blood Glucose Monitoring Suppl (ACCU-CHEK AVIVA PLUS) w/Device KIT Use meter to check blood sugar       COVID-19 mRNA Vac-TriS,  Pfizer, (PFIZER-BIONT COVID-19 VAC-TRIS) SUSP injection Inject into the muscle. 0.3 mL 0   cyanocobalamin (,VITAMIN B-12,) 1000 MCG/ML injection Vitamin B12 injection: 1000 mcg (1 mL) by subcu injection once daily for 7 days; then once weekly for 4 weeks; then once monthly thereafter.  7 07/15/2010 (Patient taking differently: Inject 1,000 mcg into the muscle every 30 (thirty) days.) 12 mL 1   doxycycline (VIBRA-TABS) 100 MG tablet Take 1 tablet (100 mg total) by mouth 2 (two) times daily. 20 tablet 0   folic acid (  FOLVITE) 1 MG tablet TAKE 1 TABLET BY MOUTH EVERY DAY 90 tablet 1   glucose blood (ACCU-CHEK AVIVA PLUS) test strip Check blood sugar 5 times daily (dx E11.49)       insulin NPH Human (NOVOLIN N) 100 UNIT/ML injection Inject 0.5 mLs (50 Units total) into the skin daily before breakfast. (Patient taking differently: Inject 50 Units into the skin daily as needed (high blood sugar).) 10 mL 11   Insulin Syringe-Needle U-100 31G X 5/16" 0.5 ML MISC Use one new syringe 6 times daily for insulin DX E11.49       Lancets Misc. (ACCU-CHEK FASTCLIX LANCET) KIT u.a.d to test BS 5 times daily       midodrine (PROAMATINE) 10 MG tablet Take 10 mg by mouth 3 (three) times a week. Tues, Thurs, Sat       nitroGLYCERIN (NITRODUR - DOSED IN MG/24 HR) 0.2 mg/hr patch Place 1 patch (0.2 mg total) onto the skin daily. 30 patch 12   oxyCODONE (OXY IR/ROXICODONE) 5 MG immediate release tablet Take 1-2 tablets (5-10 mg total) by mouth every 4 (four) hours as needed for moderate pain (pain score 4-6). 15 tablet 0   pentoxifylline (TRENTAL) 400 MG CR tablet Take 1 tablet (400 mg total) by mouth 3 (three) times daily with meals. 90 tablet 3   pravastatin (PRAVACHOL) 40 MG tablet Take 40 mg by mouth at bedtime.       sucroferric oxyhydroxide (VELPHORO) 500 MG chewable tablet Chew 1 tablet (500 mg total) by mouth 3 (three) times daily with meals. 90 tablet     traZODone (DESYREL) 50 MG tablet Take 50 mg by mouth at bedtime.        calcitRIOL (ROCALTROL) 0.25 MCG capsule Take 0.25 mcg by mouth every Monday, Wednesday, and Friday.  (Patient not taking: No sig reported)   5   clopidogrel (PLAVIX) 75 MG tablet Take 1 tablet (75 mg total) by mouth daily. (Patient not taking: No sig reported)       melatonin 1 MG TABS tablet Take 3 mg by mouth at bedtime as needed (sleep). (Patient not taking: No sig reported)        No current facility-administered medications on file prior to visit.      Physical exam:        Vitals:    07/17/20 1050  BP: 123/67  Pulse: 87  Resp: 20  Temp: 98.9 F (37.2 C)  SpO2: 98%  Weight: 172 lb (78 kg)  Height: _0  (2.007 m)      Extremities: No palpable left femoral pulse but the femoral vessel is easily palpable and bone like quality suggesting severe calcification.  He has a 2+ right femoral pulse.  He has absent popliteal and pedal pulses in both legs.  He has a well-healed transmetatarsal amputation of the right foot.  Skin:              Data: Patient had bilateral ABIs performed today which were noncompressible bilaterally.  He has had amputation of multiple toes so toe pressure was not available.   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  Plan: Patient will be scheduled for aortogram lower extremity runoff next Thursday with Dr. Carlis Abbott to avoid his dialysis today.  Possible intervention at that point.  Procedure details risk benefits possible complications were discussed with the patient today.   Ruta Hinds, MD [facility] Office: [phone]

## 2020-07-24 NOTE — H&P (View-Only) (Signed)
History and Physical Interval Note:  07/24/2020 12:30 PM  Thomas Mcguire  has presented today for surgery, with the diagnosis of peripheral vascular disease.  The various methods of treatment have been discussed with the patient and family. After consideration of risks, benefits and other options for treatment, the patient has consented to  Procedure(s): ABDOMINAL AORTOGRAM W/LOWER EXTREMITY (N/A) as a surgical intervention.  The patient's history has been reviewed, patient examined, no change in status, stable for surgery.  I have reviewed the patient's chart and labs.  Questions were answered to the patient's satisfaction.    Aortogram, left leg arteriogram, left foot tissue loss.  Thomas Mcguire  Patient is a 80 year old male who returns for follow-up today.  He was seen by Dr. Sharol Given on July 1 and found to have a new ulceration on his left foot.  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  He is on Plavix aspirin and a statin.  He is on chronic hemodialysis.  His hemodialysis day is Monday Wednesday Friday.  He does not know how the ulcer on his left foot started.  He states it was fine 1 day and then an ulcer the next day.     He was last seen March 2022.  At that point he had recently had an arteriogram with stenting of the tibioperoneal trunk up into an existing vein graft from a prior femoral-popliteal bypass by Dr. Trula Slade.  His femoropopliteal bypass was done by Dr. Scot Dock in June 2020.  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 Sharol Given.           Past Medical History:  Diagnosis Date   Anemia      low iron   Arthritis     Chronic renal insufficiency      CHECKED Q3MOS PER PT. Stage 4 as of 05/22/2018 per pt.   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   PAD  (peripheral artery disease) ([place])     Wears dentures             Past Surgical History:  Procedure Laterality Date   A/V FISTULAGRAM N/A 08/11/2018    Procedure: A/V FISTULAGRAM - Right Arm;  Surgeon: Angelia Mould, MD;  Location: [facility];  Service: Cardiovascular;  Laterality: N/A;   A/V FISTULAGRAM N/A 09/07/2019    Procedure: A/V FISTULAGRAM - Right AVF;  Surgeon: Angelia Mould, MD;  Location: [facility];  Service: Cardiovascular;  Laterality: N/A;   ABDOMINAL AORTOGRAM W/LOWER EXTREMITY Right 07/03/2018    Procedure: ABDOMINAL AORTOGRAM W/LOWER EXTREMITY;  Surgeon: Waynetta Sandy, MD;  Location: [facility];  Service: Cardiovascular;  Laterality: Right;   ABDOMINAL AORTOGRAM W/LOWER EXTREMITY N/A 02/12/2020    Procedure: ABDOMINAL AORTOGRAM W/LOWER EXTREMITY;  Surgeon: Seraf[facility], MD;  Location: [facility];  Service: Cardiovascular;  Laterality: N/A;   AMPUTATION   08/27/2011    Procedure: AMPUTATION DIGIT;  Surgeon: Newt Minion, MD;  Location: [facility];  Service: Orthopedics;  Laterality: Left;  Left Foot 3rd toe Amputation MTP joint   AMPUTATION Right 07/11/2018    Procedure: AMPUTATION RIGHT GREAT TOE;  Surgeon: Angelia Mould, MD;  Location: [facility];  Service: Vascular;  Laterality: Right;   AMPUTATION Right 02/15/2020    Procedure: TRANSMETATARSAL AMPUTATION;  Surgeon: Newt Minion, MD;  Location: [facility];  Service: Orthopedics;  Laterality: Right;   amputation great toe   2009    left   AV FISTULA PLACEMENT Right 05/23/2018    Procedure: RADIAL- CEPHALIC ARTERIOVENOUS (AV) FISTULA CREATION RIGHT ARM AND LIGATION OF COMPETING BRANCH.;  Surgeon: Angelia Mould, MD;  Location: [facility];  Service: Vascular;  Laterality: Right;   [place] Right 10/01/2019    Procedure: FIRST STAGE [place];  Surgeon: Angelia Mould, MD;  Location: [facility];  Service: Vascular;  Laterality:  Right;   [place] Right 11/13/2019    Procedure: RIGHT SECOND STAGE [place];  Surgeon: Rosetta Posner, MD;  Location: [facility];  Service: Vascular;  Laterality: Right;   COLONOSCOPY       cyst elbow   1999    right   FEMORAL-POPLITEAL BYPASS GRAFT Right 07/11/2018    Procedure: BYPASS GRAFT FEMORAL-POPLITEAL ARTERY;  Surgeon: Angelia Mould, MD;  Location: [facility];  Service: Vascular;  Laterality: Right;   FOOT SURGERY        both feet for something that was torn   IR FLUORO GUIDE CV LINE RIGHT   11/08/2019   IR US GUIDE VASC ACCESS RIGHT   11/08/2019   LIGATION OF ARTERIOVENOUS  FISTULA Right 10/01/2019    Procedure: LIGATION OF RIGHT RADIOCEPHALIC  ARTERIOVENOUS  FISTULA;  Surgeon: Angelia Mould, MD;  Location: [facility];  Service: Vascular;  Laterality: Right;   PERIPHERAL VASCULAR BALLOON ANGIOPLASTY Right 02/12/2020    Procedure: PERIPHERAL VASCULAR BALLOON ANGIOPLASTY;  Surgeon: [facility], MD;  Location: [facility];  Service: Cardiovascular;  Laterality: Right;  peroneal   PERIPHERAL VASCULAR INTERVENTION Right 02/12/2020    Procedure: PERIPHERAL VASCULAR INTERVENTION;  Surgeon: Ser[facility], MD;  Location: [facility];  Service: Cardiovascular;  Laterality: Right;  fem/pop bypass graft   TOE AMPUTATION   2010    left 2nd toe   UPPER GI ENDOSCOPY                Current Outpatient Medications on File Prior to Visit  Medication Sig Dispense Refill   Accu-Chek FastClix Lancets MISC Use one lancet 5 times every day  DX:  250.00       aspirin EC 81 MG EC tablet Take 1 tablet (81 mg total) by mouth daily. Swallow whole. 30 tablet 11   B Complex-C-Folic Acid (NEPHRO-VITE RX PO) Take 1 tablet by mouth at bedtime.       Blood Glucose Calibration (ACCU-CHEK AVIVA) SOLN U.A.D TO CALIBRATE METER       Blood Glucose Monitoring Suppl (ACCU-CHEK AVIVA PLUS) w/Device KIT Use meter to check blood sugar       COVID-19 mRNA Vac-TriS,  Pfizer, (PFIZER-BIONT COVID-19 VAC-TRIS) SUSP injection Inject into the muscle. 0.3 mL 0   cyanocobalamin (,VITAMIN B-12,) 1000 MCG/ML injection Vitamin B12 injection: 1000 mcg (1 mL) by subcu injection once daily for 7 days; then once weekly for 4 weeks; then once monthly thereafter.  7 07/15/2010 (Patient taking differently: Inject 1,000 mcg into the muscle every 30 (thirty) days.) 12 mL 1   doxycycline (VIBRA-TABS) 100 MG tablet Take 1 tablet (100 mg total) by mouth 2 (two) times daily. 20 tablet 0   folic acid (  FOLVITE) 1 MG tablet TAKE 1 TABLET BY MOUTH EVERY DAY 90 tablet 1   glucose blood (ACCU-CHEK AVIVA PLUS) test strip Check blood sugar 5 times daily (dx E11.49)       insulin NPH Human (NOVOLIN N) 100 UNIT/ML injection Inject 0.5 mLs (50 Units total) into the skin daily before breakfast. (Patient taking differently: Inject 50 Units into the skin daily as needed (high blood sugar).) 10 mL 11   Insulin Syringe-Needle U-100 31G X 5/16" 0.5 ML MISC Use one new syringe 6 times daily for insulin DX E11.49       Lancets Misc. (ACCU-CHEK FASTCLIX LANCET) KIT u.a.d to test BS 5 times daily       midodrine (PROAMATINE) 10 MG tablet Take 10 mg by mouth 3 (three) times a week. Tues, Thurs, Sat       nitroGLYCERIN (NITRODUR - DOSED IN MG/24 HR) 0.2 mg/hr patch Place 1 patch (0.2 mg total) onto the skin daily. 30 patch 12   oxyCODONE (OXY IR/ROXICODONE) 5 MG immediate release tablet Take 1-2 tablets (5-10 mg total) by mouth every 4 (four) hours as needed for moderate pain (pain score 4-6). 15 tablet 0   pentoxifylline (TRENTAL) 400 MG CR tablet Take 1 tablet (400 mg total) by mouth 3 (three) times daily with meals. 90 tablet 3   pravastatin (PRAVACHOL) 40 MG tablet Take 40 mg by mouth at bedtime.       sucroferric oxyhydroxide (VELPHORO) 500 MG chewable tablet Chew 1 tablet (500 mg total) by mouth 3 (three) times daily with meals. 90 tablet     traZODone (DESYREL) 50 MG tablet Take 50 mg by mouth at bedtime.        calcitRIOL (ROCALTROL) 0.25 MCG capsule Take 0.25 mcg by mouth every Monday, Wednesday, and Friday.  (Patient not taking: No sig reported)   5   clopidogrel (PLAVIX) 75 MG tablet Take 1 tablet (75 mg total) by mouth daily. (Patient not taking: No sig reported)       melatonin 1 MG TABS tablet Take 3 mg by mouth at bedtime as needed (sleep). (Patient not taking: No sig reported)        No current facility-administered medications on file prior to visit.      Physical exam:        Vitals:    07/17/20 1050  BP: 123/67  Pulse: 87  Resp: 20  Temp: 98.9 F (37.2 C)  SpO2: 98%  Weight: 172 lb (78 kg)  Height: _0  (2.007 m)      Extremities: No palpable left femoral pulse but the femoral vessel is easily palpable and bone like quality suggesting severe calcification.  He has a 2+ right femoral pulse.  He has absent popliteal and pedal pulses in both legs.  He has a well-healed transmetatarsal amputation of the right foot.  Skin:              Data: Patient had bilateral ABIs performed today which were noncompressible bilaterally.  He has had amputation of multiple toes so toe pressure was not available.   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  Plan: Patient will be scheduled for aortogram lower extremity runoff next Thursday with Dr. Carlis Abbott to avoid his dialysis today.  Possible intervention at that point.  Procedure details risk benefits possible complications were discussed with the patient today.   Ruta Hinds, MD [facility] Office: [phone]

## 2020-07-24 NOTE — Progress Notes (Signed)
Site area: rt groin fa sheath Site Prior to Removal:  Level 0 Pressure Applied For: 20 minutes Manual:   yes Patient Status During Pull:  stable Post Pull Site:  Level 0 Post Pull Instructions Given:  yes Post Pull Pulses Present: rt pt dopplered Dressing Applied:  gauze and tegaderm Bedrest begins @ 5284 Comments:

## 2020-07-24 NOTE — Op Note (Signed)
Patient name: Thomas Mcguire MRN: [id number] DOB: Mar 24, 1940 Sex: male  07/24/2020 Pre-operative Diagnosis: Critical limb ischemia of the left lower extremity with tissue loss Post-operative diagnosis:  Same Surgeon:  [name], MD Procedure Performed: 1.  Ultrasound-guided access right common femoral artery 2.  Aortogram including catheter selection of aorta 3.  Left lower extremity arteriogram with selection of third order branches 4.  Unsuccessful antegrade recanalization of long segment left SFA above-knee popliteal occlusion 5.  45 minutes of monitored moderate conscious sedation time  Indications: Patient is a 80 year old male who has previously undergone a right femoropopliteal bypass by Dr. Scot Dock.  He was seen by Dr. Oneida Alar in the office last week and scheduled for arteriogram today with me for a new left foot wound.  He presents today after risk benefits discussed.  Findings: Aortogram showed diffusely diseased aortoiliac segment but no flow-limiting stenosis.  Left leg runoff showed a patent common femoral that has about a 40 to 50% stenosis with a patent profunda.  There is a short stump of SFA but then long segment SFA occlusion and above-knee popliteal occlusion with reconstitution of the popliteal just above the knee joint.  Below-knee popliteal appears patent with single-vessel runoff through the PT into the foot.  The anterior tibial and peroneal appear occluded.  Ultimately, I attempted to cross the long segment left SFA above-knee popliteal occlusion but could not reenter the true lumen at the knee joint.  He will need a left common femoral to below-knee popliteal bypass with possible common femoral endarterectomy.   Procedure:  The patient was identified in the holding area and taken to room 8.  The patient was then placed supine on the table and prepped and draped in the usual sterile fashion.  A time out was called.  Ultrasound was used to evaluate the  right common femoral artery.  It was patent .  A digital ultrasound image was acquired.  A micropuncture needle was used to access the right common femoral artery under ultrasound guidance.  An 018 wire was advanced without resistance and a micropuncture sheath was placed.  The 018 wire was removed and a benson wire was placed.  The micropuncture sheath was exchanged for a 5 french sheath.  An omniflush catheter was advanced over the wire to the level of L-1.  An abdominal angiogram was obtained.  Next, using the omniflush catheter the aortic bifurcation was crossed and the catheter was placed into theleft external iliac artery and left runoff was obtained.  Ultimately after evaluating images elected to try and intervene on the left SFA above-knee popliteal occlusion given proximal SFA stump.  We used Glidewire advantage into the left SFA and then placed a long 6 [place] Ansell sheath in the right groin over the aortic bifurcation.  Patient was given 100 units/kg IV heparin.  I then used a quick cross catheter with both a Glidewire advantage and a glide cath and I could get all the way down to the above-knee popliteal artery but I was subintimal and I could not re-enter the true lumen.  I did not want to risk injury to the below knee popliteal target and elected to stop.  I think his next best option will be left common femoral to below-knee popliteal bypass.  We will obtain vein mapping today.  Taken to holding to have the sheath removed   Plan: Vein mapping today.  We will discuss with office timing of scheduling surgery.  Needs left common femoral  to below knee popliteal bypass and possible left femoral endarterectomy.    [name], MD [facility] Office: [phone]

## 2020-07-24 NOTE — Progress Notes (Signed)
Bilateral lower extremity vein mapping completed. Refer to "CV Proc" under chart review to view preliminary results.  07/24/2020 6:30 PM Kelby Aline., MHA, RVT, RDCS, RDMS

## 2020-07-25 ENCOUNTER — Encounter (HOSPITAL_COMMUNITY): Payer: Self-pay | Admitting: Vascular Surgery

## 2020-07-25 ENCOUNTER — Other Ambulatory Visit
Admission: RE | Admit: 2020-07-25 | Discharge: 2020-07-25 | Disposition: A | Discharge status: Home / Self Care | Payer: Medicare Other | Source: Ambulatory Visit | Attending: Vascular Surgery | Admitting: Vascular Surgery

## 2020-07-25 DIAGNOSIS — Z01812 Encounter for preprocedural laboratory examination: Secondary | ICD-10-CM | POA: Diagnosis present

## 2020-07-25 DIAGNOSIS — Z20822 Contact with and (suspected) exposure to covid-19: Secondary | ICD-10-CM | POA: Insufficient documentation

## 2020-07-25 LAB — SARS CORONAVIRUS 2 (TAT 6-24 HRS): SARS Coronavirus 2: NEGATIVE

## 2020-07-27 ENCOUNTER — Encounter (HOSPITAL_BASED_OUTPATIENT_CLINIC_OR_DEPARTMENT_OTHER): Payer: Self-pay

## 2020-07-27 ENCOUNTER — Other Ambulatory Visit: Payer: Self-pay

## 2020-07-27 ENCOUNTER — Emergency Department (HOSPITAL_BASED_OUTPATIENT_CLINIC_OR_DEPARTMENT_OTHER)
Admission: EM | Admit: 2020-07-27 | Discharge: 2020-07-27 | Disposition: A | Discharge status: Home / Self Care | Payer: Medicare Other | Source: Home / Self Care | Attending: Emergency Medicine | Admitting: Emergency Medicine

## 2020-07-27 ENCOUNTER — Emergency Department (HOSPITAL_BASED_OUTPATIENT_CLINIC_OR_DEPARTMENT_OTHER): Payer: Medicare Other

## 2020-07-27 ENCOUNTER — Emergency Department (HOSPITAL_BASED_OUTPATIENT_CLINIC_OR_DEPARTMENT_OTHER): Payer: Medicare Other | Admitting: Radiology

## 2020-07-27 DIAGNOSIS — I5043 Acute on chronic combined systolic (congestive) and diastolic (congestive) heart failure: Secondary | ICD-10-CM | POA: Insufficient documentation

## 2020-07-27 DIAGNOSIS — R590 Localized enlarged lymph nodes: Secondary | ICD-10-CM | POA: Insufficient documentation

## 2020-07-27 DIAGNOSIS — D631 Anemia in chronic kidney disease: Secondary | ICD-10-CM | POA: Insufficient documentation

## 2020-07-27 DIAGNOSIS — N186 End stage renal disease: Secondary | ICD-10-CM | POA: Insufficient documentation

## 2020-07-27 DIAGNOSIS — Z794 Long term (current) use of insulin: Secondary | ICD-10-CM | POA: Insufficient documentation

## 2020-07-27 DIAGNOSIS — R161 Splenomegaly, not elsewhere classified: Secondary | ICD-10-CM | POA: Insufficient documentation

## 2020-07-27 DIAGNOSIS — I132 Hypertensive heart and chronic kidney disease with heart failure and with stage 5 chronic kidney disease, or end stage renal disease: Secondary | ICD-10-CM | POA: Insufficient documentation

## 2020-07-27 DIAGNOSIS — Z992 Dependence on renal dialysis: Secondary | ICD-10-CM | POA: Insufficient documentation

## 2020-07-27 DIAGNOSIS — Z87891 Personal history of nicotine dependence: Secondary | ICD-10-CM | POA: Insufficient documentation

## 2020-07-27 DIAGNOSIS — I70262 Atherosclerosis of native arteries of extremities with gangrene, left leg: Secondary | ICD-10-CM | POA: Diagnosis not present

## 2020-07-27 DIAGNOSIS — Z7982 Long term (current) use of aspirin: Secondary | ICD-10-CM | POA: Insufficient documentation

## 2020-07-27 DIAGNOSIS — E1122 Type 2 diabetes mellitus with diabetic chronic kidney disease: Secondary | ICD-10-CM | POA: Insufficient documentation

## 2020-07-27 DIAGNOSIS — Z89411 Acquired absence of right great toe: Secondary | ICD-10-CM | POA: Insufficient documentation

## 2020-07-27 DIAGNOSIS — Z89422 Acquired absence of other left toe(s): Secondary | ICD-10-CM | POA: Insufficient documentation

## 2020-07-27 DIAGNOSIS — I462 Cardiac arrest due to underlying cardiac condition: Secondary | ICD-10-CM | POA: Diagnosis not present

## 2020-07-27 LAB — TROPONIN I (HIGH SENSITIVITY)
Troponin I (High Sensitivity): 81 ng/L — ABNORMAL HIGH (ref <18)
Troponin I (High Sensitivity): 75 ng/L — ABNORMAL HIGH (ref <18)

## 2020-07-27 LAB — COMPREHENSIVE METABOLIC PANEL
ALT: 11 U/L (ref 0–44)
AST: 23 U/L (ref 15–41)
Albumin: 4 g/dL (ref 3.5–5.0)
Alkaline Phosphatase: 48 U/L (ref 38–126)
Anion gap: 16 — ABNORMAL HIGH (ref 5–15)
BUN: 61 mg/dL — ABNORMAL HIGH (ref 8–23)
CO2: 29 mmol/L (ref 22–32)
Calcium: 9.3 mg/dL (ref 8.9–10.3)
Chloride: 92 mmol/L — ABNORMAL LOW (ref 98–111)
Creatinine, Ser: 7.84 mg/dL — ABNORMAL HIGH (ref 0.61–1.24)
GFR, Estimated: 7 mL/min — ABNORMAL LOW (ref >60)
Glucose, Bld: 129 mg/dL — ABNORMAL HIGH (ref 70–99)
Potassium: 5.5 mmol/L — ABNORMAL HIGH (ref 3.5–5.1)
Sodium: 137 mmol/L (ref 135–145)
Total Bilirubin: 0.9 mg/dL (ref 0.3–1.2)
Total Protein: 6.8 g/dL (ref 6.5–8.1)

## 2020-07-27 LAB — DIFFERENTIAL
Basophils Relative: 0 %
Eosinophils Relative: 0 %
Lymphocytes Relative: 92 %
Monocytes Relative: 0 %
Neutrophils Relative %: 8 %

## 2020-07-27 LAB — LIPASE, BLOOD: Lipase: 37 U/L (ref 11–51)

## 2020-07-27 LAB — CBC
HCT: 40.8 % (ref 39.0–52.0)
Hemoglobin: 12.8 g/dL — ABNORMAL LOW (ref 13.0–17.0)
MCH: 29.6 pg (ref 26.0–34.0)
MCHC: 31.4 g/dL (ref 30.0–36.0)
MCV: 94.2 fL (ref 80.0–100.0)
Platelets: 131 10*3/uL — ABNORMAL LOW (ref 150–400)
RBC: 4.33 MIL/uL (ref 4.22–5.81)
RDW: 16.2 % — ABNORMAL HIGH (ref 11.5–15.5)
WBC: 13 10*3/uL — ABNORMAL HIGH (ref 4.0–10.5)
nRBC: 0 % (ref 0.0–0.2)

## 2020-07-27 MED ORDER — IOHEXOL 300 MG/ML  SOLN
100.0000 mL | Freq: Once | INTRAMUSCULAR | Status: AC | PRN
  Administered 2020-07-27: 100 mL via INTRAVENOUS

## 2020-07-27 MED ORDER — HYDROCODONE-ACETAMINOPHEN 5-325 MG PO TABS
1.0000 | ORAL_TABLET | Freq: Once | ORAL | Status: AC
  Administered 2020-07-27: 1 via ORAL
  Filled 2020-07-27: qty 1

## 2020-07-27 NOTE — ED Triage Notes (Addendum)
Patient here POV from Home with ABD Pain.  Pain is present in the LLQ, RLQ and radiates to Lower Back with Movement. Pain began approximately 24 hours ago and has been worsening in intensity and frequency.   Patient believes it may be "gas" but states "it usually goes away by now".  Hx of CKD. Dialysis MWF (last Friday). Ambulatory, GCS 15. NAD.   No CP; No SOB; No Fevers; No N/V/D.

## 2020-07-27 NOTE — Discharge Instructions (Signed)
Your CT scan shows that your lymph nodes are way more swollen in your abdomen than they were 2 years ago.  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  Dr. Gearldine Shown office will call you and set up an appointment tomorrow or the next day.

## 2020-07-27 NOTE — ED Notes (Signed)
Patient aware of need for urine. Patient stated he is on dialysis so he does not go too often

## 2020-07-27 NOTE — ED Notes (Signed)
Patient transported to CT 

## 2020-07-27 NOTE — ED Provider Notes (Signed)
[facility] EMERGENC[facility] DEPT Provider Note   CSN: [id number] Arrival date & time: 07/27/20  1901     History Chief Complaint  Patient presents with   Abdominal Pain    Thomas Mcguire is a 80 y.o. male.  HPI 80 year old male presents with abdominal pain. Ongoing for about a week, maybe more.  He feels like his gas.  It is pretty much all day but at times it worsens like when he moves.  It is in his upper abdomen and back.  No chest pain.  However he has been "short winded" for quite some time, maybe a month according to his wife.  He has been using his daughter's oxygen at night until he can use oxygen.  No chest pain.  No vomiting or change in bowel habits.  He is on dialysis and due to go tomorrow.  Past Medical History:  Diagnosis Date   Anemia    low iron   Arthritis    Chronic renal insufficiency    CHECKED Q3MOS PER PT. Stage 4 as of 05/22/2018 per pt.   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   PAD (peripheral artery disease) ([place])    Wears dentures     Patient Active Problem List   Diagnosis Date Noted   Acute on chronic combined systolic and diastolic heart failure ([place]) 03/11/2020   End stage renal disease ([place]) 03/11/2020   Dependence on renal dialysis ([place]) 03/11/2020   Non-pressure chronic ulcer of other part of right foot limited to breakdown of skin ([place]) 03/11/2020   Pressure injury of sacral region, stage 2 ([place]) 03/11/2020   Primary insomnia 03/11/2020   Reactive depression 03/11/2020   Necrotic toes ([place]) 02/11/2020   Cellulitis and abscess of toe of right foot 02/10/2020   Cellulitis of right foot 02/10/2020   Bilateral hearing loss 11/26/2019   Excessive cerumen in both ear canals 11/26/2019   Fluid overload 11/07/2019   Cardiomegaly 11/07/2019   DM2 (diabetes mellitus,  type 2) ([place]) 11/07/2019   Heart failure ([place]) 12/15/2018   CKD (chronic kidney disease) 10/06/2018   Amputation of great toe ([place]) 08/14/2018   PVD (peripheral vascular disease) ([place]) 07/11/2018   Degeneration of thoracic intervertebral disc 03/09/2018   Pain in thoracic spine 03/09/2018   B12 deficiency 76/14/7092   Folic acid deficiency 95/74/7340   Chronic lymphocytic leukemia (CLL), 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   Shortness of breath 11/07/2017   Anemia in chronic kidney disease 10/12/2017   Cellulitis of right lower limb 08/01/2017   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 ([place]) 08/01/2017   Pain in right ankle and joints of right foot 11/08/2016   Peripheral venous insufficiency 07/26/2016   Encounter for general adult medical examination without abnormal findings 04/14/2015   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[place]) 04/26/2014   Obesity 08/26/2010   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   Essential hypertension 11/21/2008   Gout 11/21/2008   Hyperlipidemia 11/21/2008    Past Surgical History:  Procedure Laterality Date   A/V FISTULAGRAM N/A 08/11/2018   Procedure: A/V FISTULAGRAM - Right Arm;  Surgeon: Angelia Mould, MD;  Location: [facility];  Service: Cardiovascular;  Laterality: N/A;   A/V FISTULAGRAM N/A 09/07/2019   Procedure:  A/V FISTULAGRAM - Right AVF;  Surgeon: Angelia Mould, MD;  Location: [facility];  Service: Cardiovascular;  Laterality: N/A;   ABDOMINAL AORTOGRAM W/LOWER EXTREMITY Right 07/03/2018   Procedure: ABDOMINAL AORTOGRAM W/LOWER EXTREMITY;  Surgeon: Waynetta Sandy, MD;  Location: [facility];  Service: Cardiovascular;  Laterality: Right;   ABDOMINAL AORTOGRAM W/LOWER EXTREMITY N/A 02/12/2020   Procedure: ABDOMINAL AORTOGRAM W/LOWER EXTREMITY;  Surgeon: Serafina Mitchell, MD;  Location: [facility];  Service: Cardiovascular;  Laterality: N/A;   ABDOMINAL AORTOGRAM W/LOWER  EXTREMITY Left 07/24/2020   Procedure: ABDOMINAL AORTOGRAM W/LOWER EXTREMITY;  Surgeon: [name], MD;  Location: [facility];  Service: Cardiovascular;  Laterality: Left;   AMPUTATION  08/27/2011   Procedure: AMPUTATION DIGIT;  Surgeon: Newt Minion, MD;  Location: [facility];  Service: Orthopedics;  Laterality: Left;  Left Foot 3rd toe Amputation MTP joint   AMPUTATION Right 07/11/2018   Procedure: AMPUTATION RIGHT GREAT TOE;  Surgeon: Angelia Mould, MD;  Location: [facility];  Service: Vascular;  Laterality: Right;   AMPUTATION Right 02/15/2020   Procedure: TRANSMETATARSAL AMPUTATION;  Surgeon: Newt Minion, MD;  Location: [facility];  Service: Orthopedics;  Laterality: Right;   amputation great toe  2009   left   AV FISTULA PLACEMENT Right 05/23/2018   Procedure: RADIAL- CEPHALIC ARTERIOVENOUS (AV) FISTULA CREATION RIGHT ARM AND LIGATION OF COMPETING BRANCH.;  Surgeon: Angelia Mould, MD;  Location: [facility];  Service: Vascular;  Laterality: Right;   [place] Right 10/01/2019   Procedure: FIRST STAGE [place];  Surgeon: Angelia Mould, MD;  Location: [facility];  Service: Vascular;  Laterality: Right;   [place] Right 11/13/2019   Procedure: RIGHT SECOND STAGE [place];  Surgeon: Rosetta Posner, MD;  Location: [facility];  Service: Vascular;  Laterality: Right;   COLONOSCOPY     cyst elbow  1999   right   FEMORAL-POPLITEAL BYPASS GRAFT Right 07/11/2018   Procedure: BYPASS GRAFT FEMORAL-POPLITEAL ARTERY;  Surgeon: Angelia Mould, MD;  Location: [facility];  Service: Vascular;  Laterality: Right;   FOOT SURGERY     both feet for something that was torn   IR FLUORO GUIDE CV LINE RIGHT  11/08/2019   IR US GUIDE VASC ACCESS RIGHT  11/08/2019   LIGATION OF ARTERIOVENOUS  FISTULA Right 10/01/2019   Procedure: LIGATION OF RIGHT RADIOCEPHALIC  ARTERIOVENOUS  FISTULA;  Surgeon: Angelia Mould, MD;   Location[facility];  Service: Vascular;  Laterality: Right;   PERIPHERAL VASCULAR BALLOON ANGIOPLASTY Right 02/12/2020   Procedure: PERIPHERAL VASCULAR BALLOON ANGIOPLASTY;  Surgeon: Serafina Mitchell, MD;  Location: [facility];  Service: Cardiovascular;  Laterality: Right;  peroneal   PERIPHERAL VASCULAR INTERVENTION Right 02/12/2020   Procedure: PERIPHERAL VASCULAR INTERVENTION;  Surgeon: Serafina Mitchell, MD;  Location: [facility];  Service: Cardiovascular;  Laterality: Right;  fem/pop bypass graft   TOE AMPUTATION  2010   left 2nd toe   UPPER GI ENDOSCOPY         Family History  Problem Relation Age of Onset   Colon cancer Neg Hx     Social History   Tobacco Use   Smoking status: Former    Types: Cigarettes    Quit date: 03/11/1996    Years since quitting: 24.3   Smokeless tobacco: Never  Vaping Use   Vaping Use: Never used  Substance Use Topics  Alcohol use: No   Drug use: No    Home Medications Prior to Admission medications   Medication Sig Start Date End Date Taking? Authorizing Provider  Accu-Chek FastClix Lancets MISC Use one lancet 5 times every day  DX:  250.00 12/30/11   [provider]  aspirin EC 81 MG EC tablet Take 1 tablet (81 mg total) by mouth daily. Swallow whole. 02/21/20   Shelly Coss, MD  B Complex-C-Folic Acid (NEPHRO-VITE RX PO) Take 1 tablet by mouth at bedtime. 11/27/19   [provider]  Blood Glucose Calibration (ACCU-CHEK AVIVA) SOLN U.A.D TO CALIBRATE METER 11/04/11   [provider]  Blood Glucose Monitoring Suppl (ACCU-CHEK AVIVA PLUS) w/Device KIT Use meter to check blood sugar 10/20/11   [provider]  COVID-19 mRNA Vac-TriS, Pfizer, (PFIZER-BIONT COVID-19 VAC-TRIS) SUSP injection Inject into the muscle. 05/30/20   Carlyle Basques, MD  folic acid (FOLVITE) 1 MG tablet TAKE 1 TABLET BY MOUTH EVERY DAY Patient taking differently: Take 1 mg by mouth daily. 03/14/20   Ladell Pier, MD  glucose  blood (ACCU-CHEK AVIVA PLUS) test strip Check blood sugar 5 times daily (dx E11.49) 10/13/11   [provider]  insulin NPH Human (NOVOLIN N) 100 UNIT/ML injection Inject 0.5 mLs (50 Units total) into the skin daily before breakfast. Patient taking differently: Inject 50 Units into the skin daily as needed (high blood sugar). 11/14/19   Geradine Girt, DO  Insulin Syringe-Needle U-100 31G X 5/16" 0.5 ML MISC Use one new syringe 6 times daily for insulin DX E11.49 02/08/17   [provider]  Lancets Misc. (ACCU-CHEK FASTCLIX LANCET) KIT u.a.d to test BS 5 times daily 11/04/11   [provider]  midodrine (PROAMATINE) 10 MG tablet Take 10 mg by mouth 3 (three) times a week. Cain Saupe, Sat 11/22/19   [provider]  nitroGLYCERIN (NITRODUR - DOSED IN MG/24 HR) 0.2 mg/hr patch Place 1 patch (0.2 mg total) onto the skin daily. 07/11/20   Newt Minion, MD  oxyCODONE (OXY IR/ROXICODONE) 5 MG immediate release tablet Take 1-2 tablets (5-10 mg total) by mouth every 4 (four) hours as needed for moderate pain (pain score 4-6). 02/20/20   Shelly Coss, MD  pentoxifylline (TRENTAL) 400 MG CR tablet Take 1 tablet (400 mg total) by mouth 3 (three) times daily with meals. 07/11/20   Newt Minion, MD  pravastatin (PRAVACHOL) 40 MG tablet Take 40 mg by mouth at bedtime.    [provider]  sucroferric oxyhydroxide (VELPHORO) 500 MG chewable tablet Chew 1 tablet (500 mg total) by mouth 3 (three) times daily with meals. 02/20/20   Shelly Coss, MD  traZODone (DESYREL) 50 MG tablet Take 50 mg by mouth at bedtime. 03/14/20   [provider]  vitamin B-12 (CYANOCOBALAMIN) 1000 MCG tablet Take 1,000 mcg by mouth daily.    [provider]    Allergies    Patient has no known allergies.  Review of Systems   Review of Systems  Respiratory:  Positive for shortness of breath.   Cardiovascular:  Negative for chest pain.  Gastrointestinal:  Positive for  abdominal pain. Negative for constipation, diarrhea and vomiting.  Genitourinary:  Negative for dysuria.  Musculoskeletal:  Positive for back pain.  All other systems reviewed and are negative.  Physical Exam Updated Vital Signs BP (!) 117/56   Pulse 91   Temp 97.9 F (36.6 C) (Oral)   Resp (!) 22   Ht _0  (  1.981 m)   Wt 80.7 kg   SpO2 100%   BMI 20.57 kg/m   Physical Exam Vitals and nursing note reviewed.  Constitutional:      General: He is not in acute distress.    Appearance: He is well-developed. He is not ill-appearing or diaphoretic.  HENT:     Head: Normocephalic and atraumatic.     Right Ear: External ear normal.     Left Ear: External ear normal.     Nose: Nose normal.  Eyes:     General:        Right eye: No discharge.        Left eye: No discharge.  Cardiovascular:     Rate and Rhythm: Normal rate and regular rhythm.     Heart sounds: Normal heart sounds.  Pulmonary:     Effort: Pulmonary effort is normal.     Breath sounds: Normal breath sounds.  Abdominal:     Palpations: Abdomen is soft.     Tenderness: There is abdominal tenderness in the right upper quadrant, epigastric area and left upper quadrant.  Musculoskeletal:     Cervical back: Neck supple.  Skin:    General: Skin is warm and dry.  Neurological:     Mental Status: He is alert.  Psychiatric:        Mood and Affect: Mood is not anxious.    ED Results / Procedures / Treatments   Labs (all labs ordered are listed, but only abnormal results are displayed) Labs Reviewed  COMPREHENSIVE METABOLIC PANEL - Abnormal; Notable for the following components:      Result Value   Potassium 5.5 (*)    Chloride 92 (*)    Glucose, Bld 129 (*)    BUN 61 (*)    Creatinine, Ser 7.84 (*)    GFR, Estimated 7 (*)    Anion gap 16 (*)    All other components within normal limits  CBC - Abnormal; Notable for the following components:   WBC 13.0 (*)    Hemoglobin 12.8 (*)    RDW 16.2 (*)    Platelets  131 (*)    All other components within normal limits  TROPONIN I (HIGH SENSITIVITY) - Abnormal; Notable for the following components:   Troponin I (High Sensitivity) 81 (*)    All other components within normal limits  TROPONIN I (HIGH SENSITIVITY) - Abnormal; Notable for the following components:   Troponin I (High Sensitivity) 75 (*)    All other components within normal limits  LIPASE, BLOOD  URINALYSIS, ROUTINE W REFLEX MICROSCOPIC  DIFFERENTIAL    EKG EKG Interpretation  Date/Time:  Sunday July 27 2020 19:50:01 EDT Ventricular Rate:  82 PR Interval:  205 QRS Duration: 116 QT Interval:  395 QTC Calculation: 462 R Axis:   67 Text Interpretation: Sinus rhythm LVH with secondary repolarization abnormality similar to Feb 2022 Confirmed by Sherwood Gambler [phone]) on 07/27/2020 7:55:33 PM  Radiology DG Chest 2 View  Result Date: 07/27/2020 CLINICAL DATA:  Dyspnea EXAM: CHEST - 2 VIEW COMPARISON:  02/14/2020 FINDINGS: Right-sided central venous catheter tip over the SVC. Cardiomegaly with vascular congestion and mild pulmonary edema. Small right pleural effusion. Aortic atherosclerosis. No pneumothorax IMPRESSION: Cardiomegaly with vascular congestion, mild pulmonary edema and small right pleural effusion Electronically Signed   By: Donavan Foil M.D.   On: 07/27/2020 20:49   CT ABDOMEN PELVIS W CONTRAST  Result Date: 07/27/2020 CLINICAL DATA:  Left upper quadrant pain EXAM: CT ABDOMEN  AND PELVIS WITH CONTRAST TECHNIQUE: Multidetector CT imaging of the abdomen and pelvis was performed using the standard protocol following bolus administration of intravenous contrast. CONTRAST:  122m OMNIPAQUE IOHEXOL 300 MG/ML  SOLN COMPARISON:  CT 02/27/2018, chest CT 05/14/2020 FINDINGS: Lower chest: Lung bases demonstrate mild septal thickening. Cardiomegaly. Small right-sided pleural effusion. Hepatobiliary: No focal hepatic abnormality. Small calcified gallstones. No biliary dilatation Pancreas:  Unremarkable. No pancreatic ductal dilatation or surrounding inflammatory changes. Spleen: Enlarged, measuring 15 cm AP Adrenals/Urinary Tract: Right adrenal gland normal. 19 mm left adrenal nodule without significant change. Cortical atrophy of the kidneys. Heterogenous renal enhancement with multiple low-density lesions too small to further characterize. Few renal cysts. Delayed nephrogram and poor excretion of contrast consistent with decreased renal function. Mild hyperdensity within the bladder lumen which is incompletely distended Stomach/Bowel: Stomach nonenlarged. No dilated small bowel. No acute bowel wall thickening. Negative appendix Vascular/Lymphatic: Advanced aortic atherosclerosis without aneurysm. Extensive abdominopelvic adenopathy. Multiple enlarged gastrohepatic, porta hepatis, peripancreatic and retroperitoneal nodes, for example 3 cm porta hepatis node series 2, image 35. Bulky Peri aortic adenopathy. Numerous enlarged mesenteric nodes. Increased external iliac adenopathy measuring 24 mm on the right and 22 mm on the left, previously 16 mm and 11 mm respectively. Bilateral inguinal adenopathy also increased compared to prior Reproductive: Prostate slightly enlarged Other: Negative for free air or pelvic effusion. Subcutaneous thickening of the anterior pelvic wall. Musculoskeletal: Stable small sclerotic lesion is in left iliac bone. Chronic bilateral pars defect at L5 with trace anterolisthesis. IMPRESSION: 1. Extensive bulky abdominal, retroperitoneal, pelvic and inguinal adenopathy, progressed as compared with 2020 CT and felt suspicious for lymphoproliferative disease/lymphoma/leukemia versus metastatic disease. Splenomegaly. 2. Cardiomegaly with small right pleural effusion and mild diffuse septal thickening which could be secondary to edema 3. Delayed excretion of contrast from the kidneys suggesting decreased renal function. Correlate with appropriate laboratory value 4. Hyperdense  material within the lumen of the bladder, doubt contrast given lack of excretion from kidneys. Could represent complex or hemorrhagic debris. 5. Gallstones Electronically Signed   By: KDonavan FoilM.D.   On: 07/27/2020 20:45    Procedures Procedures   Medications Ordered in ED Medications  iohexol (OMNIPAQUE) 300 MG/ML solution 100 mL (100 mLs Intravenous Contrast Given 07/27/20 2005)  HYDROcodone-acetaminophen (NORCO/VICODIN) 5-325 MG per tablet 1 tablet (1 tablet Oral Given 07/27/20 2221)    ED Course  I have reviewed the triage vital signs and the nursing notes.  Pertinent labs & imaging results that were available during my care of the patient were reviewed by me and considered in my medical decision making (see chart for details).    MDM Rules/Calculators/A&P                          Patient's abdominal pain appears to be coming from the worsening lymphadenopathy and splenomegaly.  He has been following with Dr. SBenay Spicefor CLL that is currently not being treated but may need to be now.  I discussed with Dr. SBenay Spiceand he will have his office call the patient tomorrow to set up an appointment likely tomorrow given his upcoming surgery.  The patient initially declined some pain medicine though later was asking for something to help with pain.  We will give a dose of Norco and have him follow-up with his oncologist tomorrow.  He will get dialyzed tomorrow.  Otherwise, doubt atypical cardiac cause and his low level troponins are likely from renal function rather than ACS.  Final Clinical Impression(s) / ED Diagnoses Final diagnoses:  Lymphadenopathy, abdominal    Rx / DC Orders ED Discharge Orders     None        Sherwood Gambler, MD 07/27/20 2228

## 2020-07-27 NOTE — ED Notes (Signed)
Patient discharged home to follow up with MD.

## 2020-07-28 ENCOUNTER — Other Ambulatory Visit: Payer: Self-pay

## 2020-07-28 ENCOUNTER — Telehealth: Payer: Self-pay | Admitting: *Deleted

## 2020-07-28 ENCOUNTER — Encounter (HOSPITAL_COMMUNITY): Payer: Self-pay | Admitting: Vascular Surgery

## 2020-07-28 ENCOUNTER — Encounter: Payer: Self-pay | Admitting: Nurse Practitioner

## 2020-07-28 ENCOUNTER — Inpatient Hospital Stay: Payer: Medicare Other | Attending: Nurse Practitioner | Admitting: Nurse Practitioner

## 2020-07-28 VITALS — BP 103/58 | HR 86 | Temp 97.2°F | Resp 20 | Wt 168.7 lb

## 2020-07-28 DIAGNOSIS — C9110 Chronic lymphocytic leukemia of B-cell type not having achieved remission: Secondary | ICD-10-CM

## 2020-07-28 NOTE — Progress Notes (Signed)
Anesthesia Chart Review: Thomas Mcguire   Case: [id number] Date/Time: 08/04/2020 0715   Procedure: LEFT FEMORAL-POPLITEAL ARTERY ARTERY BYPASS GRAFTING (Left)   Anesthesia type: General   Pre-op diagnosis: PAD   Location: [facility] OR ROOM 12 / [place] OR   Surgeons: Angelia Mould, MD       DISCUSSION: Patient is a 80 year old male scheduled for the above procedure. Late add-on, so case is a same day work-up.  History includes former smoker (quit 03/11/96), ESRD (HD MWF, 2nd stage right basilic vein transposition 11/13/19), HLD, HTN, DM2, anemia, CLL (~ diagnosed ~ 2019), CHF, PAD (left 3rd toe amputation 08/27/11; right femoral-popliteal (below knee) bypass using GSV graft & right great toe amputation 07/11/18; right femoral-popliteal artery stent & right tibioperoneal trunk stent, right peroneal artery angioplasty 02/12/20; right foot transmetatarsal amputation 02/15/20).  - [place] ED visit 07/27/20 for abdominal pain and SOB. CT imaging showed extensive bulky abdominal, retroperitoneal, pelvic and inguinal adenopathy, progressed as compared to 2020 CT. Provider wrote, "Patient's abdominal pain appears to be coming from the worsening lymphadenopathy and splenomegaly.  He has been following with Dr. Benay Spice for CLL that is currently not being treated but may need to be now.  I discussed with Dr. Benay Spice and he will have his office call the patient tomorrow to set up an appointment likely tomorrow given his upcoming surgery.Marland KitchenMarland KitchenMarland KitchenHe will get dialyzed tomorrow.  Otherwise, doubt atypical cardiac cause and his low level troponins are likely from renal function rather than ACS." He was [facility] by [name], NP on 07/28/20. Labs felt stable. Abdominal pain significantly improved. He may be considered to [place]'s tyrosine kinase inhibitor therapy, but holding systemic therapy for now. Plan for follow-up in 2-3 weeks following vascular surgery admission.   - [facility] admission 02/10/20-02/20/20 for right  foot swelling and gangrene in setting of known PAD, right FBPG 06/2018. He underwent stents to right femoral-popliteal artery, right TP trunk, and angioplasty to right peroneal artery. S/p TMA 02/15/20. Nephrology managed ESRD. Discharged to [place] admission 11/07/19-11/14/19 for volume overload in the setting of worsening CKD. His nephrologist and PCP had spoken following PCP visit and nephrologist advised he go to the ED to be considered for initiation of hemodialysis. Echo done and showed LVEF 35-40%, global hypokinesis, grade 2 DD, RVSP 44, severe LAE. Cardiology was not consulted. Plan to resume Coreg and Imdur when BP improved. A TDC was placed on 11/08/19 and HD started on 11/09/19. CHF/fluid status managed by HD.  He underwent 2nd stage BVT pm 11/13/19. Unable to resume Coreg, amlodipine, and Imdur at discharge due to low BP. (Now on midodrine.)  Per his report during PAT RN phone interview, his last HD was on 07/28/20. Recently, he has had dialysis via a TDC due to infiltration of his RUE AVF, but on 07/28/20 was able to use AVF again.   Patient is a same day work-up, as he is a late add-on. PAT RN spoke with patient ~ 4:30 PM on 07/28/20. History as outlined above. He does have HFrEF. EF was 49% in 2003 with non-ischemic stress test, but more recently 35-40% with diffuse hypokinesis by echo during  10/2019 admission for progressive CKD to ESRD. He does not see a cardiologist, but is followed by PCP Leanna Battles, MD and by nephrology. He undergoes HD MWD and has tolerated dialysis access procedure and right TMA surgery under anesthesia since his last echo. He has been on Coreg and Imdur in  the past, but unable to resume after he started dialysis due to hypotension. Unsure of case urgency, as [facility] office is now closed. Discussed with anesthesiologist Suella Broad, MD. Patient will have to be evaluated on the day of surgery by his assigned anesthesiologist. Definitive plan at that time. Patient  is for a COVID-19 test on the day of surgery since he has been out without a mask since his last test on 07/25/20. [place] vaccine 05/30/20.   VS:  BP Readings from Last 3 Encounters:  07/28/20 (!) 103/58  07/27/20 (!) 117/56  07/24/20 (!) 108/55   Pulse Readings from Last 3 Encounters:  07/28/20 86  07/27/20 91  07/24/20 82     PROVIDERS: Leanna Battles, MD is PCP [facility]) Lawson Radar, MD is nephrologist Julieanne Manson, MD is HEM-ONC   LABS: Most recent lab results include: Lab Results  Component Value Date   WBC 13.0 (H) 07/27/2020   HGB 12.8 (L) 07/27/2020   HCT 40.8 07/27/2020   PLT 131 (L) 07/27/2020   GLUCOSE 129 (H) 07/27/2020   ALT 11 07/27/2020   AST 23 07/27/2020   NA 137 07/27/2020   K 5.5 (H) 07/27/2020   CL 92 (L) 07/27/2020   CREATININE 7.84 (H) 07/27/2020   BUN 61 (H) 07/27/2020   CO2 29 07/27/2020   TSH 1.517 11/08/2019   INR 1.4 (H) 11/08/2019   HGBA1C 6.1 (H) 02/11/2020  He reported a more recent A1c of 5.9%.    IMAGES: CXR 07/27/20: FINDINGS: Right-sided central venous catheter tip over the SVC. Cardiomegaly with vascular congestion and mild pulmonary edema. Small right pleural effusion. Aortic atherosclerosis. No pneumothorax IMPRESSION: Cardiomegaly with vascular congestion, mild pulmonary edema and small right pleural effusion   CT Abd/pelvis 07/27/20: IMPRESSION: 1. Extensive bulky abdominal, retroperitoneal, pelvic and inguinal adenopathy, progressed as compared with 2020 CT and felt suspicious for lymphoproliferative disease/lymphoma/leukemia versus metastatic disease. Splenomegaly. 2. Cardiomegaly with small right pleural effusion and mild diffuse septal thickening which could be secondary to edema 3. Delayed excretion of contrast from the kidneys suggesting decreased renal function. Correlate with appropriate laboratory value 4. Hyperdense material within the lumen of the bladder,  doubt contrast given lack of excretion from kidneys. Could represent complex or hemorrhagic debris. 5. Gallstones  CTA Chest 05/14/20: IMPRESSION: 1. No appreciable pulmonary embolus. No thoracic aortic aneurysm. There is aortic atherosclerosis as well as foci of great vessel and coronary artery calcification. 2. Enlargement of the main pulmonary outflow tract, likely representing underlying pulmonary arterial hypertension. 3. Several enlarged lymph nodes. Spleen incompletely visualized but appears enlarged. This combination of findings is concerning for potential underlying neoplasm. Note that lymphoma in particular potentially could present in this manner. 4. Nodular opacities as noted with a ground-glass type opacity measuring 1.2 x 1.0 cm. Given this finding in conjunction with other findings, follow-up chest CT in 3 months to further evaluate is warranted. Note that there is no edema or consolidation. There is a small right pleural effusion with areas of atelectatic change. 5. Reflux of contrast into the inferior vena cava and hepatic veins likely is indicative of a degree of increased right heart pressure. - Dr. Philip Aspen has ordered follow-up CT imaging which is scheduled for 08/14/20.   EKG: 07/27/20: Sinus rhythm LVH with secondary repolarization abnormality similar to Feb 2022 Confirmed by Sherwood Gambler [phone]) on 07/27/2020 7:55:33 PM   CV: Aortogram with LLE arteriogram 07/24/20 Carlis Abbott, Harrell Gave, MD): FINDINGS: - Aortogram showed diffusely diseased aortoiliac segment but  no flow-limiting stenosis. - Left leg runoff showed a patent common femoral that has about a 40 to 50% stenosis with a patent profunda.  There is a short stump of SFA but then long segment SFA occlusion and above-knee popliteal occlusion with reconstitution of the popliteal just above the knee joint.  Below-knee popliteal appears patent with single-vessel runoff through the PT into the foot.  The  anterior tibial and peroneal appear occluded. - Ultimately, I attempted to cross the long segment left SFA above-knee popliteal occlusion but could not reenter the true lumen at the knee joint.  He will need a left common femoral to below-knee popliteal bypass with possible common femoral endarterectomy. PLAN:  Vein mapping today.  We will discuss with office timing of scheduling surgery.  Needs left common femoral to below knee popliteal bypass and possible left femoral endarterectomy.     Echo 11/08/19: IMPRESSIONS   1. Left ventricular ejection fraction, by estimation, is 35 to 40%. The  left ventricle has moderately decreased function. The left ventricle  demonstrates global hypokinesis. The left ventricular internal cavity size  was mildly dilated. Left ventricular  diastolic parameters are consistent with Grade II diastolic dysfunction  (pseudonormalization).   2. Right ventricular systolic function is normal. The right ventricular  size is normal. There is mildly elevated pulmonary artery systolic  pressure. The estimated right ventricular systolic pressure is 29.5 mmHg.   3. The mitral valve is abnormal. Restriction of the posterior leaflet  with moderate mitral valve regurgitation (PISA ERO 0.25 cm^2). No evidence  of mitral stenosis.   4. The aortic valve is tricuspid. Aortic valve regurgitation is mild. No  aortic stenosis is present.   5. Aortic dilatation noted. There is mild dilatation of the aortic root,  measuring 40 mm.   6. Left atrial size was severely dilated.   7. Right atrial size was mildly dilated.   8. The inferior vena cava is dilated in size with <50% respiratory  variability, suggesting right atrial pressure of 15 mmHg.    US Carotid 07/07/18: Summary:  - Right Carotid: Velocities in the right ICA are consistent with a 1-39% stenosis.  - Left Carotid: Velocities in the left ICA are consistent with a 1-39% stenosis.  - Vertebrals:  Bilateral vertebral  arteries demonstrate antegrade flow.  - Subclavians: Normal flow hemodynamics were seen in bilateral subclavian arteries.    Nuclear stress test 01/02/02: Impression: 1.  Fair exercise tolerance, limited by claudication. 2.  Adequate blood pressure response. 3.  Clinically negative for angina. 4.  Electrographically with resting changes, but no further changes of ischemia with exercise. 5.  Low normal ejection fraction of 49% with equivocal inferior basilar hypokinesia. 6.  Inferior basilar thinning questionably due to diaphragmatic attenuation, but with no significant areas of redistribution to suggest ischemia.   Past Medical History:  Diagnosis Date   Anemia    low iron   Arthritis    Chronic renal insufficiency    CHECKED Q3MOS PER PT. Stage 4 as of 05/22/2018 per pt.   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   PAD (peripheral artery disease) ([place])    Wears dentures     Past Surgical History:  Procedure Laterality Date   A/V  FISTULAGRAM N/A 08/11/2018   Procedure: A/V FISTULAGRAM - Right Arm;  Surgeon: Angelia Mould, MD;  Location: [place] [facility];  Service: Cardiovascular;  Laterality: N/A;   A/V FISTULAGRAM N/A 09/07/2019   Procedure: A/V FISTULAGRAM - Right AVF;  Surgeon: Angelia Mould, MD;  Location: [place] [facility];  Service: Cardiovascular;  Laterality: N/A;   ABDOMINAL AORTOGRAM W/LOWER EXTREMITY Right 07/03/2018   Procedure: ABDOMINAL AORTOGRAM W/LOWER EXTREMITY;  Surgeon: Waynetta Sandy, MD;  Location: [place] [facility];  Service: Cardiovascular;  Laterality: Right;   ABDOMINAL AORTOGRAM W/LOWER EXTREMITY N/A 02/12/2020   Procedure: ABDOMINAL AORTOGRAM W/LOWER EXTREMITY;  Surgeon: Serafina Mitchell, MD;  Location: [place] [facility];  Service:  Cardiovascular;  Laterality: N/A;   ABDOMINAL AORTOGRAM W/LOWER EXTREMITY Left 07/24/2020   Procedure: ABDOMINAL AORTOGRAM W/LOWER EXTREMITY;  Surgeon: [name], MD;  Location: [place] [facility];  Service: Cardiovascular;  Laterality: Left;   AMPUTATION  08/27/2011   Procedure: AMPUTATION DIGIT;  Surgeon: Newt Minion, MD;  Location: [place];  Service: Orthopedics;  Laterality: Left;  Left Foot 3rd toe Amputation MTP joint   AMPUTATION Right 07/11/2018   Procedure: AMPUTATION RIGHT GREAT TOE;  Surgeon: Angelia Mould, MD;  Location: [place];  Service: Vascular;  Laterality: Right;   AMPUTATION Right 02/15/2020   Procedure: TRANSMETATARSAL AMPUTATION;  Surgeon: Newt Minion, MD;  Location: [place];  Service: Orthopedics;  Laterality: Right;   amputation great toe  2009   left   AV FISTULA PLACEMENT Right 05/23/2018   Procedure: RADIAL- CEPHALIC ARTERIOVENOUS (AV) FISTULA CREATION RIGHT ARM AND LIGATION OF COMPETING BRANCH.;  Surgeon: Angelia Mould, MD;  Location: [place];  Service: Vascular;  Laterality: Right;   [place] Right 10/01/2019   Procedure: FIRST STAGE [place];  Surgeon: Angelia Mould, MD;  Location: [place];  Service: Vascular;  Laterality: Right;   [place] Right 11/13/2019   Procedure: RIGHT SECOND STAGE [place];  Surgeon: Rosetta Posner, MD;  Location: [place];  Service: Vascular;  Laterality: Right;   COLONOSCOPY     cyst elbow  1999   right   FEMORAL-POPLITEAL BYPASS GRAFT Right 07/11/2018   Procedure: BYPASS GRAFT FEMORAL-POPLITEAL ARTERY;  Surgeon: Angelia Mould, MD;  Location: [place];  Service: Vascular;  Laterality: Right;   FOOT SURGERY     both feet for something that was torn   IR FLUORO GUIDE CV LINE RIGHT  11/08/2019   IR US GUIDE VASC ACCESS RIGHT  11/08/2019   LIGATION OF ARTERIOVENOUS  FISTULA Right 10/01/2019   Procedure: LIGATION OF RIGHT RADIOCEPHALIC   ARTERIOVENOUS  FISTULA;  Surgeon: Angelia Mould, MD;  Location: [facility];  Service: Vascular;  Laterality: Right;   PERIPHERAL VASCULAR BALLOON ANGIOPLASTY Right 02/12/2020   Procedure: PERIPHERAL VASCULAR BALLOON ANGIOPLASTY;  Surgeon: Serafina Mitchell, MD;  Location: [place] [facility];  Service: Cardiovascular;  Laterality: Right;  peroneal   PERIPHERAL VASCULAR INTERVENTION Right 02/12/2020   Procedure: PERIPHERAL VASCULAR INTERVENTION;  Surgeon: Serafina Mitchell, MD;  Location: [place] [facility];  Service: Cardiovascular;  Laterality: Right;  fem/pop bypass graft   TOE AMPUTATION  2010   left 2nd toe   UPPER GI ENDOSCOPY      MEDICATIONS: No current facility-administered medications for this encounter.    Accu-Chek FastClix Lancets MISC   aspirin EC 81 MG EC tablet   B Complex-C-Folic Acid (NEPHRO-VITE RX PO)  Blood Glucose Calibration (ACCU-CHEK AVIVA) SOLN   Blood Glucose Monitoring Suppl (ACCU-CHEK AVIVA PLUS) w/Device KIT   COVID-19 mRNA Vac-TriS, Pfizer, (PFIZER-BIONT COVID-19 VAC-TRIS) SUSP injection   folic acid (FOLVITE) 1 MG tablet   glucose blood (ACCU-CHEK AVIVA PLUS) test strip   insulin NPH Human (NOVOLIN N) 100 UNIT/ML injection   Insulin Syringe-Needle U-100 31G X 5/16" 0.5 ML MISC   Lancets Misc. (ACCU-CHEK FASTCLIX LANCET) KIT   midodrine (PROAMATINE) 10 MG tablet   nitroGLYCERIN (NITRODUR - DOSED IN MG/24 HR) 0.2 mg/hr patch   oxyCODONE (OXY IR/ROXICODONE) 5 MG immediate release tablet   pentoxifylline (TRENTAL) 400 MG CR tablet   pravastatin (PRAVACHOL) 40 MG tablet   sucroferric oxyhydroxide (VELPHORO) 500 MG chewable tablet   traZODone (DESYREL) 50 MG tablet   vitamin B-12 (CYANOCOBALAMIN) 1000 MCG tablet    Myra Gianotti, PA-C Surgical Short Stay/Anesthesiology [facility] Phone [phone] [facility] Phone [phone] 07/28/2020 5:16 PM

## 2020-07-28 NOTE — Progress Notes (Addendum)
Thomas Mcguire denies chest pain or shortness of breath. Thomas Mcguire was negative for Covid on 07/25/20. Patient said that he is wearing a mask when he is out. Thomas Mcguire was eating in a restaurant when I called him.I spoke to Dr. Smith Robert, patient is to be retested on arrival.  Thomas Mcguire has type II diabetes, patient reports that fasting CBGs run in the 100- 110, last A1C was 5.9. I instructed Thomas Mcguire to that 1/2 of NPH Insulin tonight- 25 units.  Thomas Mcguire  reports that he also takes Novolog Insulin on sliding scale basic, he could not tell me the scale.  I called Dr. Beverly Sessions office to request information, the office was closed at 1643. Thomas Mcguire states that he rarely has to take Novolog Insulin. I instructed Thomas Mcguire to take 1/2 of NPH Insulin tonight.  I instructed patient to check CBG after awaking and every 2 hours until arrival  to the hospital. I instructed patient if CBG is less than 70 to take 4 Glucose Tablets or 1 tube of Glucose Gel or 1/2 cup of a clear juice, apple or cranberry. Recheck CBG in 15 minutes if CBG is not over 70 call, pre- op desk at [phone] for further instructions.   I instructed Thomas Mcguire to wash up well with antibiotic soap, patient has a hemodialysis catheter. Dry off with a clean towel. Do not put lotion, powder, cologne or deodorant or makeup.No jewelry or piercings. Men may shave their face and neck.  Wear clean clothes, brush your teeth. Glasses, contact lens,dentures or partials may not be worn in the OR. If you need to wear them, please bring a case for glasses, do not wear contacts or bring a case, the hospital does not have contact cases, dentures or partials will have to be removed , make sure they are clean, we will provide a denture cup to put them in.   PCP is Dr. Leanna Battles, patient does not see a cardiologist

## 2020-07-28 NOTE — Progress Notes (Signed)
[facility] OFFICE PROGRESS NOTE   Diagnosis: CLL  INTERVAL HISTORY:   Thomas Mcguire returns prior to scheduled follow-up.  He was seen in the emergency department yesterday to evaluate abdominal pain.  CT abdomen/pelvis showed extensive bulky abdominal, retroperitoneal, pelvic and inguinal adenopathy, progressed as compared to 2020 CT.  He reports progressive abdominal pain over the past 3 to 4 weeks.  He sought evaluation in the emergency department yesterday.  No nausea or vomiting.  No constipation or diarrhea.  No fevers or sweats.  He reports his appetite is "shot".  He thinks he is losing weight.  He has noted no increase in peripheral lymph nodes.  Abdominal pain is significantly better today.  Objective:  Vital signs in last 24 hours:  Blood pressure (!) 103/58, pulse 86, temperature (!) 97.2 F (36.2 C), temperature source Temporal, resp. rate 20, weight 168 lb 10.4 oz (76.5 kg), SpO2 100 %.    Lymphatics: Bilateral anterior/posterior cervical and scalene lymph nodes.  1 cm submental node.  Bilateral axillary, inguinal and femoral nodes, largest in the left axilla measuring 3 to 4 cm. Resp: Lungs clear bilaterally. Cardio: Regular rate and rhythm. GI: Abdomen soft.  Fullness left upper abdomen with associated mild tenderness.  No hepatomegaly. Vascular: Trace edema lower leg bilaterally. Skin: Ulceration distal left foot medially. Dialysis catheter at the right chest.  Lab Results:  Lab Results  Component Value Date   WBC 13.0 (H) 07/27/2020   HGB 12.8 (L) 07/27/2020   HCT 40.8 07/27/2020   MCV 94.2 07/27/2020   PLT 131 (L) 07/27/2020   NEUTROABS 2.3 05/30/2020    Imaging:  DG Chest 2 View  Result Date: 07/27/2020 CLINICAL DATA:  Dyspnea EXAM: CHEST - 2 VIEW COMPARISON:  02/14/2020 FINDINGS: Right-sided central venous catheter tip over the SVC. Cardiomegaly with vascular congestion and mild pulmonary edema. Small right pleural effusion. Aortic  atherosclerosis. No pneumothorax IMPRESSION: Cardiomegaly with vascular congestion, mild pulmonary edema and small right pleural effusion Electronically Signed   By: Donavan Foil M.D.   On: 07/27/2020 20:49   CT ABDOMEN PELVIS W CONTRAST  Result Date: 07/27/2020 CLINICAL DATA:  Left upper quadrant pain EXAM: CT ABDOMEN AND PELVIS WITH CONTRAST TECHNIQUE: Multidetector CT imaging of the abdomen and pelvis was performed using the standard protocol following bolus administration of intravenous contrast. CONTRAST:  122mL OMNIPAQUE IOHEXOL 300 MG/ML  SOLN COMPARISON:  CT 02/27/2018, chest CT 05/14/2020 FINDINGS: Lower chest: Lung bases demonstrate mild septal thickening. Cardiomegaly. Small right-sided pleural effusion. Hepatobiliary: No focal hepatic abnormality. Small calcified gallstones. No biliary dilatation Pancreas: Unremarkable. No pancreatic ductal dilatation or surrounding inflammatory changes. Spleen: Enlarged, measuring 15 cm AP Adrenals/Urinary Tract: Right adrenal gland normal. 19 mm left adrenal nodule without significant change. Cortical atrophy of the kidneys. Heterogenous renal enhancement with multiple low-density lesions too small to further characterize. Few renal cysts. Delayed nephrogram and poor excretion of contrast consistent with decreased renal function. Mild hyperdensity within the bladder lumen which is incompletely distended Stomach/Bowel: Stomach nonenlarged. No dilated small bowel. No acute bowel wall thickening. Negative appendix Vascular/Lymphatic: Advanced aortic atherosclerosis without aneurysm. Extensive abdominopelvic adenopathy. Multiple enlarged gastrohepatic, porta hepatis, peripancreatic and retroperitoneal nodes, for example 3 cm porta hepatis node series 2, image 35. Bulky Peri aortic adenopathy. Numerous enlarged mesenteric nodes. Increased external iliac adenopathy measuring 24 mm on the right and 22 mm on the left, previously 16 mm and 11 mm respectively. Bilateral  inguinal adenopathy also increased compared to prior Reproductive: Prostate  slightly enlarged Other: Negative for free air or pelvic effusion. Subcutaneous thickening of the anterior pelvic wall. Musculoskeletal: Stable small sclerotic lesion is in left iliac bone. Chronic bilateral pars defect at L5 with trace anterolisthesis. IMPRESSION: 1. Extensive bulky abdominal, retroperitoneal, pelvic and inguinal adenopathy, progressed as compared with 2020 CT and felt suspicious for lymphoproliferative disease/lymphoma/leukemia versus metastatic disease. Splenomegaly. 2. Cardiomegaly with small right pleural effusion and mild diffuse septal thickening which could be secondary to edema 3. Delayed excretion of contrast from the kidneys suggesting decreased renal function. Correlate with appropriate laboratory value 4. Hyperdense material within the lumen of the bladder, doubt contrast given lack of excretion from kidneys. Could represent complex or hemorrhagic debris. 5. Gallstones Electronically Signed   By: Donavan Foil M.D.   On: 07/27/2020 20:45    Medications: I have reviewed the patient's current medications.  Assessment/Plan: 1.  Chronic lymphocytic leukemia CT abdomen/pelvis 07/27/2020-extensive bulky abdominal, retroperitoneal, pelvic and inguinal adenopathy, progressed as compared to 2020. 2.  Anemia 3.  Chronic renal failure, on hemodialysis 4.  Vitamin H41, folic acid?  Deficiency 5.  Diabetes 6.  Hypertension 7.  Thrombocytopenia 8.  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  Nonhealing ulcer left foot first metatarsal head, status post left great, second, and third toe amputations  Disposition: Thomas Mcguire was seen in the emergency department yesterday for evaluation of worsening abdominal pain over the past 3 to 4 weeks.  CT scan showed progressive abdominal, retroperitoneal, pelvic and inguinal adenopathy.  Etiology of the abdominal pain is unclear as the  pain is significantly better today.  Review of the CBC from yesterday shows stable blood counts.  Thomas Mcguire is scheduled to be admitted tomorrow for left common femoral to below-knee popliteal bypass and possible left femoral endarterectomy.  He will return here for follow-up and labs in 2 to 3 weeks.  We are available to see him sooner if needed.  Patient seen with Dr. Benay Spice.  Gustavus Mcguire ANP/GNP-BC   07/28/2020  2:39 PM  This was a shared visit with [name].  Thomas Mcguire was interviewed and examined.  We reviewed the CT images from yesterday with Thomas Mcguire and his wife.  Abdominal pain is significantly improved today.  It is unclear whether his pain is related to CLL.  I suspect the pain is related to another etiology.  The palpable peripheral lymphadenopathy is unchanged and he is stable from a hematologic standpoint.  We discussed beginning treatment with a Bruton's tyrosine kinase inhibitor.  I recommend holding systemic therapy for now.  He will return for an office visit in reassessment in a few weeks.  He is scheduled undergo surgery for peripheral vascular disease tomorrow.  I was present for greater than 50% of today's visit.  I performed medical decision making.  Julieanne Manson, MD

## 2020-07-28 NOTE — Telephone Encounter (Signed)
Wife call back to confirm they can be here at 2:30 pm today. Has dialysis till 1:30 pm.

## 2020-07-28 NOTE — Telephone Encounter (Signed)
Per Dr. Benay Spice: Scheduled for OV today at 2:25. Notified patient via VM and requested call to confirm.

## 2020-07-28 NOTE — Anesthesia Preprocedure Evaluation (Addendum)
Anesthesia Evaluation  Patient identified by MRN, date of birth, ID band Patient awake    Reviewed: Allergy & Precautions, NPO status , Patient's Chart, lab work & pertinent test results  History of Anesthesia Complications Negative for: history of anesthetic complications  Airway Mallampati: I  TM Distance: >3 FB Neck ROM: Full    Dental  (+) Edentulous Upper, Edentulous Lower   Pulmonary COPD, former smoker,  07/25/2020 SARS coronavirus NEG   breath sounds clear to auscultation       Cardiovascular hypertension, + Peripheral Vascular Disease   Rhythm:Regular Rate:Normal  '21 ECHO: EF 35-40%, grade 2 DD, mod MR with post leaflet restriction, mild AI   Neuro/Psych Depression Diabetic peripheral neuropathy    GI/Hepatic negative GI ROS, Neg liver ROS,   Endo/Other  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     Musculoskeletal  (+) Arthritis ,   Abdominal   Peds  Hematology plavix CLL   Anesthesia Other Findings   Reproductive/Obstetrics                           Anesthesia Physical Anesthesia Plan  ASA: 3  Anesthesia Plan: General   Post-op Pain Management:    Induction: Intravenous  PONV Risk Score and Plan: 2 and Ondansetron and Dexamethasone  Airway Management Planned: Oral ETT  Additional Equipment: Arterial line  Intra-op Plan:   Post-operative Plan: Extubation in OR  Informed Consent: I have reviewed the patients History and Physical, chart, labs and discussed the procedure including the risks, benefits and alternatives for the proposed anesthesia with the patient or authorized representative who has indicated his/her understanding and acceptance.       Plan Discussed with: CRNA and Surgeon  Anesthesia Plan Comments: (See PAT note written 07/28/2020 by Myra Gianotti, PA-C. )      Anesthesia Quick Evaluation

## 2020-07-29 ENCOUNTER — Inpatient Hospital Stay (HOSPITAL_COMMUNITY): Payer: Medicare Other

## 2020-07-29 ENCOUNTER — Encounter (HOSPITAL_COMMUNITY): Payer: Self-pay | Admitting: Vascular Surgery

## 2020-07-29 ENCOUNTER — Encounter (HOSPITAL_COMMUNITY): Admission: RE | Disposition: E | Payer: Self-pay | Source: Home / Self Care | Attending: Vascular Surgery

## 2020-07-29 ENCOUNTER — Inpatient Hospital Stay (HOSPITAL_COMMUNITY)
Admission: RE | Admit: 2020-07-29 | Discharge: 2020-08-11 | DRG: 252 | Disposition: E | Payer: Medicare Other | Attending: Vascular Surgery | Admitting: Vascular Surgery

## 2020-07-29 DIAGNOSIS — I739 Peripheral vascular disease, unspecified: Secondary | ICD-10-CM

## 2020-07-29 DIAGNOSIS — Z794 Long term (current) use of insulin: Secondary | ICD-10-CM

## 2020-07-29 DIAGNOSIS — R570 Cardiogenic shock: Secondary | ICD-10-CM | POA: Diagnosis not present

## 2020-07-29 DIAGNOSIS — Z66 Do not resuscitate: Secondary | ICD-10-CM | POA: Diagnosis not present

## 2020-07-29 DIAGNOSIS — E1122 Type 2 diabetes mellitus with diabetic chronic kidney disease: Secondary | ICD-10-CM | POA: Diagnosis present

## 2020-07-29 DIAGNOSIS — E11621 Type 2 diabetes mellitus with foot ulcer: Secondary | ICD-10-CM | POA: Diagnosis present

## 2020-07-29 DIAGNOSIS — L97529 Non-pressure chronic ulcer of other part of left foot with unspecified severity: Secondary | ICD-10-CM | POA: Diagnosis present

## 2020-07-29 DIAGNOSIS — I5043 Acute on chronic combined systolic (congestive) and diastolic (congestive) heart failure: Secondary | ICD-10-CM | POA: Diagnosis not present

## 2020-07-29 DIAGNOSIS — I083 Combined rheumatic disorders of mitral, aortic and tricuspid valves: Secondary | ICD-10-CM | POA: Diagnosis present

## 2020-07-29 DIAGNOSIS — I132 Hypertensive heart and chronic kidney disease with heart failure and with stage 5 chronic kidney disease, or end stage renal disease: Secondary | ICD-10-CM | POA: Diagnosis present

## 2020-07-29 DIAGNOSIS — T8111XA Postprocedural  cardiogenic shock, initial encounter: Secondary | ICD-10-CM | POA: Diagnosis not present

## 2020-07-29 DIAGNOSIS — I429 Cardiomyopathy, unspecified: Secondary | ICD-10-CM | POA: Diagnosis present

## 2020-07-29 DIAGNOSIS — Z4659 Encounter for fitting and adjustment of other gastrointestinal appliance and device: Secondary | ICD-10-CM

## 2020-07-29 DIAGNOSIS — A419 Sepsis, unspecified organism: Secondary | ICD-10-CM | POA: Diagnosis not present

## 2020-07-29 DIAGNOSIS — I779 Disorder of arteries and arterioles, unspecified: Secondary | ICD-10-CM | POA: Diagnosis present

## 2020-07-29 DIAGNOSIS — J9601 Acute respiratory failure with hypoxia: Secondary | ICD-10-CM | POA: Diagnosis not present

## 2020-07-29 DIAGNOSIS — Z419 Encounter for procedure for purposes other than remedying health state, unspecified: Secondary | ICD-10-CM

## 2020-07-29 DIAGNOSIS — N2581 Secondary hyperparathyroidism of renal origin: Secondary | ICD-10-CM | POA: Diagnosis present

## 2020-07-29 DIAGNOSIS — Z452 Encounter for adjustment and management of vascular access device: Secondary | ICD-10-CM

## 2020-07-29 DIAGNOSIS — I70222 Atherosclerosis of native arteries of extremities with rest pain, left leg: Secondary | ICD-10-CM | POA: Diagnosis present

## 2020-07-29 DIAGNOSIS — I70262 Atherosclerosis of native arteries of extremities with gangrene, left leg: Principal | ICD-10-CM | POA: Diagnosis present

## 2020-07-29 DIAGNOSIS — Z992 Dependence on renal dialysis: Secondary | ICD-10-CM

## 2020-07-29 DIAGNOSIS — E1142 Type 2 diabetes mellitus with diabetic polyneuropathy: Secondary | ICD-10-CM | POA: Diagnosis present

## 2020-07-29 DIAGNOSIS — R509 Fever, unspecified: Secondary | ICD-10-CM

## 2020-07-29 DIAGNOSIS — R54 Age-related physical debility: Secondary | ICD-10-CM | POA: Diagnosis present

## 2020-07-29 DIAGNOSIS — Z79899 Other long term (current) drug therapy: Secondary | ICD-10-CM

## 2020-07-29 DIAGNOSIS — E785 Hyperlipidemia, unspecified: Secondary | ICD-10-CM | POA: Diagnosis present

## 2020-07-29 DIAGNOSIS — E1151 Type 2 diabetes mellitus with diabetic peripheral angiopathy without gangrene: Secondary | ICD-10-CM | POA: Diagnosis present

## 2020-07-29 DIAGNOSIS — D631 Anemia in chronic kidney disease: Secondary | ICD-10-CM | POA: Diagnosis present

## 2020-07-29 DIAGNOSIS — Z87891 Personal history of nicotine dependence: Secondary | ICD-10-CM

## 2020-07-29 DIAGNOSIS — R590 Localized enlarged lymph nodes: Secondary | ICD-10-CM | POA: Diagnosis present

## 2020-07-29 DIAGNOSIS — Z7982 Long term (current) use of aspirin: Secondary | ICD-10-CM

## 2020-07-29 DIAGNOSIS — R6521 Severe sepsis with septic shock: Secondary | ICD-10-CM | POA: Diagnosis not present

## 2020-07-29 DIAGNOSIS — N186 End stage renal disease: Secondary | ICD-10-CM | POA: Diagnosis present

## 2020-07-29 DIAGNOSIS — I4891 Unspecified atrial fibrillation: Secondary | ICD-10-CM | POA: Diagnosis not present

## 2020-07-29 DIAGNOSIS — K668 Other specified disorders of peritoneum: Secondary | ICD-10-CM

## 2020-07-29 DIAGNOSIS — T884XXA Failed or difficult intubation, initial encounter: Secondary | ICD-10-CM

## 2020-07-29 DIAGNOSIS — N179 Acute kidney failure, unspecified: Secondary | ICD-10-CM | POA: Diagnosis present

## 2020-07-29 DIAGNOSIS — I9589 Other hypotension: Secondary | ICD-10-CM | POA: Diagnosis present

## 2020-07-29 DIAGNOSIS — L8990 Pressure ulcer of unspecified site, unspecified stage: Secondary | ICD-10-CM | POA: Insufficient documentation

## 2020-07-29 DIAGNOSIS — I77810 Thoracic aortic ectasia: Secondary | ICD-10-CM | POA: Diagnosis present

## 2020-07-29 DIAGNOSIS — I998 Other disorder of circulatory system: Secondary | ICD-10-CM

## 2020-07-29 DIAGNOSIS — C9110 Chronic lymphocytic leukemia of B-cell type not having achieved remission: Secondary | ICD-10-CM | POA: Diagnosis present

## 2020-07-29 DIAGNOSIS — R34 Anuria and oliguria: Secondary | ICD-10-CM | POA: Diagnosis present

## 2020-07-29 DIAGNOSIS — Z7902 Long term (current) use of antithrombotics/antiplatelets: Secondary | ICD-10-CM

## 2020-07-29 DIAGNOSIS — Z89422 Acquired absence of other left toe(s): Secondary | ICD-10-CM

## 2020-07-29 DIAGNOSIS — Z6820 Body mass index (BMI) 20.0-20.9, adult: Secondary | ICD-10-CM

## 2020-07-29 DIAGNOSIS — I2510 Atherosclerotic heart disease of native coronary artery without angina pectoris: Secondary | ICD-10-CM | POA: Diagnosis present

## 2020-07-29 DIAGNOSIS — Z89431 Acquired absence of right foot: Secondary | ICD-10-CM

## 2020-07-29 DIAGNOSIS — I462 Cardiac arrest due to underlying cardiac condition: Secondary | ICD-10-CM | POA: Diagnosis not present

## 2020-07-29 DIAGNOSIS — M109 Gout, unspecified: Secondary | ICD-10-CM | POA: Diagnosis present

## 2020-07-29 DIAGNOSIS — I5082 Biventricular heart failure: Secondary | ICD-10-CM | POA: Diagnosis not present

## 2020-07-29 DIAGNOSIS — R9431 Abnormal electrocardiogram [ECG] [EKG]: Secondary | ICD-10-CM | POA: Diagnosis not present

## 2020-07-29 DIAGNOSIS — Z7189 Other specified counseling: Secondary | ICD-10-CM

## 2020-07-29 DIAGNOSIS — E872 Acidosis: Secondary | ICD-10-CM | POA: Diagnosis present

## 2020-07-29 DIAGNOSIS — R64 Cachexia: Secondary | ICD-10-CM | POA: Diagnosis present

## 2020-07-29 DIAGNOSIS — E875 Hyperkalemia: Secondary | ICD-10-CM | POA: Diagnosis present

## 2020-07-29 HISTORY — PX: FEMORAL-POPLITEAL BYPASS GRAFT: SHX937

## 2020-07-29 HISTORY — DX: Chronic lymphocytic leukemia of B-cell type not having achieved remission: C91.10

## 2020-07-29 HISTORY — PX: INTRAOPERATIVE ARTERIOGRAM: SHX5157

## 2020-07-29 LAB — GLUCOSE, CAPILLARY
Glucose-Capillary: 130 mg/dL — ABNORMAL HIGH (ref 70–99)
Glucose-Capillary: 183 mg/dL — ABNORMAL HIGH (ref 70–99)
Glucose-Capillary: 193 mg/dL — ABNORMAL HIGH (ref 70–99)
Glucose-Capillary: 231 mg/dL — ABNORMAL HIGH (ref 70–99)

## 2020-07-29 LAB — BLOOD GAS, ARTERIAL
Acid-Base Excess: 2.6 mmol/L — ABNORMAL HIGH (ref 0.0–2.0)
Bicarbonate: 25.9 mmol/L (ref 20.0–28.0)
Drawn by: 602861
FIO2: 21
O2 Saturation: 97.4 %
Patient temperature: 37
pCO2 arterial: 34.6 mmHg (ref 32.0–48.0)
pH, Arterial: 7.486 — ABNORMAL HIGH (ref 7.350–7.450)
pO2, Arterial: 96.2 mmHg (ref 83.0–108.0)

## 2020-07-29 LAB — COMPREHENSIVE METABOLIC PANEL
ALT: 12 U/L (ref 0–44)
AST: 17 U/L (ref 15–41)
Albumin: 3.2 g/dL — ABNORMAL LOW (ref 3.5–5.0)
Alkaline Phosphatase: 51 U/L (ref 38–126)
Anion gap: 14 (ref 5–15)
BUN: 54 mg/dL — ABNORMAL HIGH (ref 8–23)
CO2: 24 mmol/L (ref 22–32)
Calcium: 9.3 mg/dL (ref 8.9–10.3)
Chloride: 96 mmol/L — ABNORMAL LOW (ref 98–111)
Creatinine, Ser: 7.07 mg/dL — ABNORMAL HIGH (ref 0.61–1.24)
GFR, Estimated: 7 mL/min — ABNORMAL LOW (ref >60)
Glucose, Bld: 120 mg/dL — ABNORMAL HIGH (ref 70–99)
Potassium: 5.1 mmol/L (ref 3.5–5.1)
Sodium: 134 mmol/L — ABNORMAL LOW (ref 135–145)
Total Bilirubin: 1.1 mg/dL (ref 0.3–1.2)
Total Protein: 6 g/dL — ABNORMAL LOW (ref 6.5–8.1)

## 2020-07-29 LAB — TYPE AND SCREEN
ABO/RH(D): O POS
Antibody Screen: NEGATIVE

## 2020-07-29 LAB — CBC
HCT: 38.5 % — ABNORMAL LOW (ref 39.0–52.0)
Hemoglobin: 11.7 g/dL — ABNORMAL LOW (ref 13.0–17.0)
MCH: 29 pg (ref 26.0–34.0)
MCHC: 30.4 g/dL (ref 30.0–36.0)
MCV: 95.3 fL (ref 80.0–100.0)
Platelets: 134 10*3/uL — ABNORMAL LOW (ref 150–400)
RBC: 4.04 MIL/uL — ABNORMAL LOW (ref 4.22–5.81)
RDW: 15.9 % — ABNORMAL HIGH (ref 11.5–15.5)
WBC: 10.9 10*3/uL — ABNORMAL HIGH (ref 4.0–10.5)
nRBC: 0.3 % — ABNORMAL HIGH (ref 0.0–0.2)

## 2020-07-29 LAB — PROTIME-INR
INR: 1.1 (ref 0.8–1.2)
Prothrombin Time: 14.3 seconds (ref 11.4–15.2)

## 2020-07-29 LAB — HEPATITIS B SURFACE ANTIGEN: Hepatitis B Surface Ag: NONREACTIVE

## 2020-07-29 LAB — HEPATITIS B SURFACE ANTIBODY,QUALITATIVE: Hep B S Ab: NONREACTIVE

## 2020-07-29 LAB — SURGICAL PCR SCREEN
MRSA, PCR: NEGATIVE
Staphylococcus aureus: NEGATIVE

## 2020-07-29 LAB — APTT: aPTT: 28 seconds (ref 24–36)

## 2020-07-29 LAB — HEPATITIS B CORE ANTIBODY, TOTAL: Hep B Core Total Ab: NONREACTIVE

## 2020-07-29 SURGERY — BYPASS GRAFT FEMORAL-POPLITEAL ARTERY
Anesthesia: General | Site: Leg Upper | Laterality: Left

## 2020-07-29 MED ORDER — SENNOSIDES-DOCUSATE SODIUM 8.6-50 MG PO TABS: 1.0000 | ORAL_TABLET | Freq: Every evening | ORAL | Status: DC | PRN

## 2020-07-29 MED ORDER — SUGAMMADEX SODIUM 200 MG/2ML IV SOLN
INTRAVENOUS | Status: DC | PRN
  Administered 2020-07-29: 200 mg via INTRAVENOUS

## 2020-07-29 MED ORDER — SODIUM CHLORIDE 0.9 % IV SOLN: 100.0000 mL | INTRAVENOUS | Status: DC | PRN

## 2020-07-29 MED ORDER — PROSOURCE PLUS PO LIQD
30.0000 mL | Freq: Two times a day (BID) | ORAL | Status: DC
  Administered 2020-07-30 (×2): 30 mL via ORAL
  Filled 2020-07-29 (×2): qty 30

## 2020-07-29 MED ORDER — ORAL CARE MOUTH RINSE: 15.0000 mL | Freq: Once | OROMUCOSAL | Status: AC

## 2020-07-29 MED ORDER — FENTANYL CITRATE (PF) 100 MCG/2ML IJ SOLN
25.0000 ug | INTRAMUSCULAR | Status: DC | PRN
  Administered 2020-07-29 (×4): 25 ug via INTRAVENOUS

## 2020-07-29 MED ORDER — ONDANSETRON HCL 4 MG/2ML IJ SOLN: 4.0000 mg | Freq: Four times a day (QID) | INTRAMUSCULAR | Status: DC | PRN

## 2020-07-29 MED ORDER — LIDOCAINE 2% (20 MG/ML) 5 ML SYRINGE
INTRAMUSCULAR | Status: DC | PRN
  Administered 2020-07-29: 20 mg via INTRAVENOUS
  Administered 2020-07-29: 80 mg via INTRAVENOUS

## 2020-07-29 MED ORDER — PHENYLEPHRINE 40 MCG/ML (10ML) SYRINGE FOR IV PUSH (FOR BLOOD PRESSURE SUPPORT)
PREFILLED_SYRINGE | INTRAVENOUS | Status: DC | PRN
  Administered 2020-07-29 (×3): 40 ug via INTRAVENOUS

## 2020-07-29 MED ORDER — ACETAMINOPHEN 650 MG RE SUPP: 325.0000 mg | RECTAL | Status: DC | PRN

## 2020-07-29 MED ORDER — CHLORHEXIDINE GLUCONATE CLOTH 2 % EX PADS: 6.0000 | MEDICATED_PAD | Freq: Once | CUTANEOUS | Status: DC

## 2020-07-29 MED ORDER — MIDODRINE HCL 5 MG PO TABS
10.0000 mg | ORAL_TABLET | ORAL | Status: DC
  Administered 2020-07-30: 10 mg via ORAL
  Filled 2020-07-29: qty 2

## 2020-07-29 MED ORDER — ROCURONIUM BROMIDE 10 MG/ML (PF) SYRINGE
PREFILLED_SYRINGE | INTRAVENOUS | Status: DC | PRN
  Administered 2020-07-29: 20 mg via INTRAVENOUS
  Administered 2020-07-29: 60 mg via INTRAVENOUS
  Administered 2020-07-29 (×2): 20 mg via INTRAVENOUS

## 2020-07-29 MED ORDER — RENA-VITE PO TABS
1.0000 | ORAL_TABLET | Freq: Every day | ORAL | Status: DC
  Administered 2020-07-29: 1 via ORAL
  Filled 2020-07-29: qty 1

## 2020-07-29 MED ORDER — SODIUM CHLORIDE 0.9 % IV SOLN: 500.0000 mL | Freq: Once | INTRAVENOUS | Status: DC | PRN

## 2020-07-29 MED ORDER — 0.9 % SODIUM CHLORIDE (POUR BTL) OPTIME
TOPICAL | Status: DC | PRN
  Administered 2020-07-29: 2000 mL

## 2020-07-29 MED ORDER — HEPARIN 6000 UNIT IRRIGATION SOLUTION
Status: DC | PRN
  Administered 2020-07-29: 1

## 2020-07-29 MED ORDER — PANTOPRAZOLE SODIUM 40 MG PO TBEC
40.0000 mg | DELAYED_RELEASE_TABLET | Freq: Every day | ORAL | Status: DC
  Administered 2020-07-30: 40 mg via ORAL
  Filled 2020-07-29: qty 1

## 2020-07-29 MED ORDER — IODIXANOL 320 MG/ML IV SOLN
INTRAVENOUS | Status: DC | PRN
  Administered 2020-07-29: 100 mL

## 2020-07-29 MED ORDER — SODIUM CHLORIDE 0.9 % IV SOLN: INTRAVENOUS | Status: DC

## 2020-07-29 MED ORDER — OXYCODONE-ACETAMINOPHEN 5-325 MG PO TABS
1.0000 | ORAL_TABLET | ORAL | Status: DC | PRN
  Administered 2020-07-29: 1 via ORAL
  Administered 2020-07-29 – 2020-07-30 (×2): 2 via ORAL
  Filled 2020-07-29 (×2): qty 2

## 2020-07-29 MED ORDER — MAGNESIUM SULFATE 2 GM/50ML IV SOLN: 2.0000 g | Freq: Every day | INTRAVENOUS | Status: DC | PRN

## 2020-07-29 MED ORDER — ALUM & MAG HYDROXIDE-SIMETH 200-200-20 MG/5ML PO SUSP: 15.0000 mL | ORAL | Status: DC | PRN

## 2020-07-29 MED ORDER — HEPARIN SODIUM (PORCINE) 5000 UNIT/ML IJ SOLN
5000.0000 [IU] | Freq: Three times a day (TID) | INTRAMUSCULAR | Status: DC
  Administered 2020-07-30: 5000 [IU] via SUBCUTANEOUS
  Filled 2020-07-29: qty 1

## 2020-07-29 MED ORDER — CEFAZOLIN SODIUM-DEXTROSE 2-4 GM/100ML-% IV SOLN
INTRAVENOUS | Status: AC
  Filled 2020-07-29: qty 100

## 2020-07-29 MED ORDER — HYDROMORPHONE HCL 1 MG/ML IJ SOLN: 0.5000 mg | INTRAMUSCULAR | Status: DC | PRN

## 2020-07-29 MED ORDER — PAPAVERINE HCL 30 MG/ML IJ SOLN
INTRAMUSCULAR | Status: AC
  Filled 2020-07-29: qty 2

## 2020-07-29 MED ORDER — PHENYLEPHRINE HCL-NACL 10-0.9 MG/250ML-% IV SOLN
INTRAVENOUS | Status: DC | PRN
  Administered 2020-07-29: 10 ug/min via INTRAVENOUS

## 2020-07-29 MED ORDER — CEFAZOLIN SODIUM-DEXTROSE 2-4 GM/100ML-% IV SOLN
2.0000 g | INTRAVENOUS | Status: AC
  Administered 2020-07-29: 2 g via INTRAVENOUS

## 2020-07-29 MED ORDER — DEXAMETHASONE SODIUM PHOSPHATE 10 MG/ML IJ SOLN
INTRAMUSCULAR | Status: DC | PRN
  Administered 2020-07-29: 5 mg via INTRAVENOUS

## 2020-07-29 MED ORDER — PRAVASTATIN SODIUM 40 MG PO TABS
40.0000 mg | ORAL_TABLET | Freq: Every day | ORAL | Status: DC
  Administered 2020-07-29: 40 mg via ORAL
  Filled 2020-07-29: qty 1

## 2020-07-29 MED ORDER — CHLORHEXIDINE GLUCONATE CLOTH 2 % EX PADS
6.0000 | MEDICATED_PAD | Freq: Every day | CUTANEOUS | Status: DC
  Administered 2020-07-30: 6 via TOPICAL

## 2020-07-29 MED ORDER — HEPARIN SODIUM (PORCINE) 1000 UNIT/ML IJ SOLN
INTRAMUSCULAR | Status: DC | PRN
  Administered 2020-07-29: 8000 [IU] via INTRAVENOUS

## 2020-07-29 MED ORDER — PROTAMINE SULFATE 10 MG/ML IV SOLN
INTRAVENOUS | Status: DC | PRN
  Administered 2020-07-29: 40 mg via INTRAVENOUS

## 2020-07-29 MED ORDER — ONDANSETRON HCL 4 MG/2ML IJ SOLN
INTRAMUSCULAR | Status: DC | PRN
  Administered 2020-07-29: 4 mg via INTRAVENOUS

## 2020-07-29 MED ORDER — FENTANYL CITRATE (PF) 250 MCG/5ML IJ SOLN
INTRAMUSCULAR | Status: AC
  Filled 2020-07-29: qty 5

## 2020-07-29 MED ORDER — ACETAMINOPHEN 325 MG PO TABS
325.0000 mg | ORAL_TABLET | ORAL | Status: DC | PRN
  Administered 2020-07-30: 650 mg via ORAL
  Filled 2020-07-29: qty 2

## 2020-07-29 MED ORDER — FENTANYL CITRATE (PF) 100 MCG/2ML IJ SOLN
INTRAMUSCULAR | Status: AC
  Filled 2020-07-29: qty 2

## 2020-07-29 MED ORDER — POTASSIUM CHLORIDE CRYS ER 20 MEQ PO TBCR: 20.0000 meq | EXTENDED_RELEASE_TABLET | Freq: Every day | ORAL | Status: DC | PRN

## 2020-07-29 MED ORDER — HEPARIN SODIUM (PORCINE) 1000 UNIT/ML DIALYSIS: 1000.0000 [IU] | INTRAMUSCULAR | Status: DC | PRN

## 2020-07-29 MED ORDER — DOCUSATE SODIUM 100 MG PO CAPS
100.0000 mg | ORAL_CAPSULE | Freq: Every day | ORAL | Status: DC
  Administered 2020-07-30: 100 mg via ORAL
  Filled 2020-07-29: qty 1

## 2020-07-29 MED ORDER — HEPARIN SODIUM (PORCINE) 1000 UNIT/ML IJ SOLN
INTRAMUSCULAR | Status: AC
  Filled 2020-07-29: qty 1

## 2020-07-29 MED ORDER — HYDRALAZINE HCL 20 MG/ML IJ SOLN: 5.0000 mg | INTRAMUSCULAR | Status: DC | PRN

## 2020-07-29 MED ORDER — METOPROLOL TARTRATE 5 MG/5ML IV SOLN: 2.0000 mg | INTRAVENOUS | Status: DC | PRN

## 2020-07-29 MED ORDER — ROCURONIUM BROMIDE 10 MG/ML (PF) SYRINGE
PREFILLED_SYRINGE | INTRAVENOUS | Status: AC
  Filled 2020-07-29: qty 10

## 2020-07-29 MED ORDER — PROPOFOL 10 MG/ML IV BOLUS
INTRAVENOUS | Status: AC
  Filled 2020-07-29: qty 40

## 2020-07-29 MED ORDER — SUCROFERRIC OXYHYDROXIDE 500 MG PO CHEW
500.0000 mg | CHEWABLE_TABLET | Freq: Three times a day (TID) | ORAL | Status: DC
  Administered 2020-07-29 – 2020-07-30 (×2): 500 mg via ORAL
  Filled 2020-07-29 (×4): qty 1

## 2020-07-29 MED ORDER — PAPAVERINE HCL 30 MG/ML IJ SOLN
INTRAMUSCULAR | Status: DC | PRN
  Administered 2020-07-29: 2 mL via INTRAVENOUS

## 2020-07-29 MED ORDER — INSULIN ASPART 100 UNIT/ML IJ SOLN
0.0000 [IU] | Freq: Three times a day (TID) | INTRAMUSCULAR | Status: DC
  Administered 2020-07-29: 1 [IU] via SUBCUTANEOUS

## 2020-07-29 MED ORDER — PENTAFLUOROPROP-TETRAFLUOROETH EX AERO: 1.0000 "application " | INHALATION_SPRAY | CUTANEOUS | Status: DC | PRN

## 2020-07-29 MED ORDER — LIDOCAINE HCL (PF) 1 % IJ SOLN: 5.0000 mL | INTRAMUSCULAR | Status: DC | PRN

## 2020-07-29 MED ORDER — LIDOCAINE-PRILOCAINE 2.5-2.5 % EX CREA: 1.0000 "application " | TOPICAL_CREAM | CUTANEOUS | Status: DC | PRN

## 2020-07-29 MED ORDER — ONDANSETRON HCL 4 MG/2ML IJ SOLN
INTRAMUSCULAR | Status: AC
  Filled 2020-07-29: qty 2

## 2020-07-29 MED ORDER — LABETALOL HCL 5 MG/ML IV SOLN
10.0000 mg | INTRAVENOUS | Status: DC | PRN
Start: 2020-07-29 — End: 2020-07-31

## 2020-07-29 MED ORDER — ASPIRIN EC 81 MG PO TBEC
81.0000 mg | DELAYED_RELEASE_TABLET | Freq: Every day | ORAL | Status: DC
  Administered 2020-07-30: 81 mg via ORAL
  Filled 2020-07-29: qty 1

## 2020-07-29 MED ORDER — FOLIC ACID 1 MG PO TABS
1.0000 mg | ORAL_TABLET | Freq: Every day | ORAL | Status: DC
  Administered 2020-07-30: 1 mg via ORAL
  Filled 2020-07-29: qty 1

## 2020-07-29 MED ORDER — BISACODYL 5 MG PO TBEC: 5.0000 mg | DELAYED_RELEASE_TABLET | Freq: Every day | ORAL | Status: DC | PRN

## 2020-07-29 MED ORDER — HEPARIN 6000 UNIT IRRIGATION SOLUTION
Status: AC
  Filled 2020-07-29: qty 500

## 2020-07-29 MED ORDER — CHLORHEXIDINE GLUCONATE 0.12 % MT SOLN
OROMUCOSAL | Status: AC
  Filled 2020-07-29: qty 15

## 2020-07-29 MED ORDER — ALTEPLASE 2 MG IJ SOLR: 2.0000 mg | Freq: Once | INTRAMUSCULAR | Status: DC | PRN

## 2020-07-29 MED ORDER — PENTOXIFYLLINE ER 400 MG PO TBCR
400.0000 mg | EXTENDED_RELEASE_TABLET | Freq: Three times a day (TID) | ORAL | Status: DC
  Administered 2020-07-29 – 2020-07-30 (×2): 400 mg via ORAL
  Filled 2020-07-29 (×4): qty 1

## 2020-07-29 MED ORDER — PHENYLEPHRINE 40 MCG/ML (10ML) SYRINGE FOR IV PUSH (FOR BLOOD PRESSURE SUPPORT)
PREFILLED_SYRINGE | INTRAVENOUS | Status: AC
  Filled 2020-07-29: qty 10

## 2020-07-29 MED ORDER — CHLORHEXIDINE GLUCONATE 0.12 % MT SOLN: 15.0000 mL | Freq: Once | OROMUCOSAL | Status: AC

## 2020-07-29 MED ORDER — LIDOCAINE 2% (20 MG/ML) 5 ML SYRINGE
INTRAMUSCULAR | Status: AC
  Filled 2020-07-29: qty 5

## 2020-07-29 MED ORDER — PROPOFOL 10 MG/ML IV BOLUS
INTRAVENOUS | Status: DC | PRN
  Administered 2020-07-29: 70 mg via INTRAVENOUS

## 2020-07-29 MED ORDER — CEFAZOLIN SODIUM-DEXTROSE 2-4 GM/100ML-% IV SOLN
2.0000 g | Freq: Once | INTRAVENOUS | Status: AC
  Administered 2020-07-29: 2 g via INTRAVENOUS
  Filled 2020-07-29: qty 100

## 2020-07-29 MED ORDER — PHENOL 1.4 % MT LIQD: 1.0000 | OROMUCOSAL | Status: DC | PRN

## 2020-07-29 MED ORDER — TRAZODONE HCL 50 MG PO TABS
50.0000 mg | ORAL_TABLET | Freq: Every day | ORAL | Status: DC
  Administered 2020-07-29: 50 mg via ORAL
  Filled 2020-07-29: qty 1

## 2020-07-29 MED ORDER — OXYCODONE-ACETAMINOPHEN 5-325 MG PO TABS
ORAL_TABLET | ORAL | Status: AC
  Filled 2020-07-29: qty 1

## 2020-07-29 MED ORDER — CHLORHEXIDINE GLUCONATE 0.12 % MT SOLN
OROMUCOSAL | Status: AC
  Administered 2020-07-29: 15 mL via OROMUCOSAL
  Filled 2020-07-29: qty 15

## 2020-07-29 MED ORDER — FENTANYL CITRATE (PF) 250 MCG/5ML IJ SOLN
INTRAMUSCULAR | Status: DC | PRN
  Administered 2020-07-29: 50 ug via INTRAVENOUS
  Administered 2020-07-29 (×2): 25 ug via INTRAVENOUS
  Administered 2020-07-29: 150 ug via INTRAVENOUS
  Administered 2020-07-29: 25 ug via INTRAVENOUS
  Administered 2020-07-29 (×3): 50 ug via INTRAVENOUS
  Administered 2020-07-29: 25 ug via INTRAVENOUS

## 2020-07-29 MED ORDER — GUAIFENESIN-DM 100-10 MG/5ML PO SYRP: 15.0000 mL | ORAL_SOLUTION | ORAL | Status: DC | PRN

## 2020-07-29 MED ORDER — DEXMEDETOMIDINE (PRECEDEX) IN NS 20 MCG/5ML (4 MCG/ML) IV SYRINGE
PREFILLED_SYRINGE | INTRAVENOUS | Status: DC | PRN
  Administered 2020-07-29: 8 ug via INTRAVENOUS

## 2020-07-29 MED ORDER — DEXAMETHASONE SODIUM PHOSPHATE 10 MG/ML IJ SOLN
INTRAMUSCULAR | Status: AC
  Filled 2020-07-29: qty 1

## 2020-07-29 SURGICAL SUPPLY — 57 items
BAG COUNTER SPONGE SURGICOUNT (BAG) ×2 IMPLANT
BANDAGE ESMARK 6X9 LF (GAUZE/BANDAGES/DRESSINGS) ×1 IMPLANT
BNDG ELASTIC 4X5.8 VLCR STR LF (GAUZE/BANDAGES/DRESSINGS) ×2 IMPLANT
BNDG ESMARK 6X9 LF (GAUZE/BANDAGES/DRESSINGS) ×2
CANISTER SUCT 3000ML PPV (MISCELLANEOUS) ×2 IMPLANT
CANNULA VESSEL 3MM 2 BLNT TIP (CANNULA) ×8 IMPLANT
CLIP VESOCCLUDE MED 24/CT (CLIP) ×2 IMPLANT
CLIP VESOCCLUDE SM WIDE 24/CT (CLIP) ×4 IMPLANT
COVER PROBE W GEL 5X96 (DRAPES) IMPLANT
CUFF TOURN SGL QUICK 24 (TOURNIQUET CUFF) ×2
CUFF TOURN SGL QUICK 34 (TOURNIQUET CUFF)
CUFF TOURN SGL QUICK 42 (TOURNIQUET CUFF) IMPLANT
CUFF TRNQT CYL 24X4X16.5-23 (TOURNIQUET CUFF) ×1 IMPLANT
CUFF TRNQT CYL 34X4.125X (TOURNIQUET CUFF) IMPLANT
DERMABOND ADVANCED (GAUZE/BANDAGES/DRESSINGS) ×3
DERMABOND ADVANCED .7 DNX12 (GAUZE/BANDAGES/DRESSINGS) ×3 IMPLANT
DRAIN CHANNEL 15F RND FF W/TCR (WOUND CARE) IMPLANT
DRAPE HALF SHEET 40X57 (DRAPES) IMPLANT
DRAPE X-RAY CASS 24X20 (DRAPES) IMPLANT
ELECT REM PT RETURN 9FT ADLT (ELECTROSURGICAL) ×2
ELECTRODE REM PT RTRN 9FT ADLT (ELECTROSURGICAL) ×1 IMPLANT
EVACUATOR SILICONE 100CC (DRAIN) IMPLANT
GLOVE SRG 8 PF TXTR STRL LF DI (GLOVE) ×1 IMPLANT
GLOVE SURG ENC MOIS LTX SZ7.5 (GLOVE) ×2 IMPLANT
GLOVE SURG UNDER POLY LF SZ7 (GLOVE) ×2 IMPLANT
GLOVE SURG UNDER POLY LF SZ8 (GLOVE) ×2
GOWN STRL REUS W/ TWL LRG LVL3 (GOWN DISPOSABLE) ×3 IMPLANT
GOWN STRL REUS W/TWL LRG LVL3 (GOWN DISPOSABLE) ×6
KIT BASIN OR (CUSTOM PROCEDURE TRAY) ×2 IMPLANT
KIT TURNOVER KIT B (KITS) ×2 IMPLANT
MARKER GRAFT CORONARY BYPASS (MISCELLANEOUS) ×2 IMPLANT
NS IRRIG 1000ML POUR BTL (IV SOLUTION) ×4 IMPLANT
PACK PERIPHERAL VASCULAR (CUSTOM PROCEDURE TRAY) ×2 IMPLANT
PAD ARMBOARD 7.5X6 YLW CONV (MISCELLANEOUS) ×4 IMPLANT
SET COLLECT BLD 21X3/4 12 (NEEDLE) IMPLANT
SPONGE SURGIFOAM ABS GEL 100 (HEMOSTASIS) IMPLANT
STOPCOCK 4 WAY LG BORE MALE ST (IV SETS) IMPLANT
SUT ETHIBOND 5 LR DA (SUTURE) IMPLANT
SUT ETHILON 3 0 PS 1 (SUTURE) IMPLANT
SUT MNCRL AB 4-0 PS2 18 (SUTURE) ×8 IMPLANT
SUT PROLENE 5 0 C 1 24 (SUTURE) ×8 IMPLANT
SUT PROLENE 6 0 BV (SUTURE) ×4 IMPLANT
SUT PROLENE 7 0 BV 1 (SUTURE) ×2 IMPLANT
SUT SILK 2 0 (SUTURE) ×2
SUT SILK 2 0 SH (SUTURE) ×2 IMPLANT
SUT SILK 2-0 18XBRD TIE 12 (SUTURE) ×1 IMPLANT
SUT SILK 3 0 (SUTURE) ×4
SUT SILK 3-0 18XBRD TIE 12 (SUTURE) ×2 IMPLANT
SUT VIC AB 2-0 CTB1 (SUTURE) ×2 IMPLANT
SUT VIC AB 3-0 SH 27 (SUTURE) ×8
SUT VIC AB 3-0 SH 27X BRD (SUTURE) ×4 IMPLANT
SYR 20CC LL (SYRINGE) ×2 IMPLANT
TOWEL GREEN STERILE (TOWEL DISPOSABLE) ×2 IMPLANT
TRAY FOLEY MTR SLVR 16FR STAT (SET/KITS/TRAYS/PACK) ×2 IMPLANT
TUBING EXTENTION W/L.L. (IV SETS) IMPLANT
UNDERPAD 30X36 HEAVY ABSORB (UNDERPADS AND DIAPERS) ×2 IMPLANT
WATER STERILE IRR 1000ML POUR (IV SOLUTION) ×2 IMPLANT

## 2020-07-29 NOTE — Discharge Instructions (Signed)

## 2020-07-29 NOTE — Transfer of Care (Signed)
Immediate Anesthesia Transfer of Care Note  Patient: Thomas Mcguire  Procedure(s) Performed: LEFT FEMORAL-BELOW KNEE POPLITEAL ARTERY ARTERY BYPASS GRAFT  WITH EXCISION OF INGUINAL LYMPH NODES (Left: Leg Upper) INTRA OPERATIVE ARTERIOGRAM (Left: Leg Upper)  Patient Location: PACU  Anesthesia Type:General  Level of Consciousness: awake and confused  Airway & Oxygen Therapy: Patient Spontanous Breathing and Patient connected to face mask oxygen  Post-op Assessment: Report given to RN and Post -op Vital signs reviewed and stable  Post vital signs: Reviewed and stable  Last Vitals:  Vitals Value Taken Time  BP 104/51 07/23/2020 1232  Temp    Pulse 58 07/21/2020 1234  Resp 23 08/02/2020 1234  SpO2 100 % 08/04/2020 1234  Vitals shown include unvalidated device data.  Last Pain:  Vitals:   07/27/2020 0641  TempSrc:   PainSc: 3       Patients Stated Pain Goal: 3 (00/17/49 4496)  Complications: No notable events documented.

## 2020-07-29 NOTE — Op Note (Signed)
NAME: Thomas Mcguire    MRN: [id number] DOB: 1940/09/11    DATE OF OPERATION: 07/25/2020  PREOP DIAGNOSIS:    Critical limb ischemia left lower extremity  POSTOP DIAGNOSIS:    Same  PROCEDURE:    Left common femoral to below-knee popliteal artery bypass with long reversed translocated saphenous vein graft Excision of 3 large inguinal lymph nodes Intraoperative arteriogram  SURGEON: Judeth Cornfield. Scot Dock, MD  ASSIST: Karoline Caldwell, PA  ANESTHESIA: General   EBL: 100 cc  INDICATIONS:    Thomas Mcguire is a 80 y.o. male who presented with a nonhealing wound of his left foot.  He underwent an arteriogram which showed severe infrainguinal arterial occlusive disease.  His only options for revascularization was a Fem below-knee popliteal artery bypass.  FINDINGS:   Intraoperative arteriogram showed no technical problems.  There was a good posterior tibial signal at the completion of the procedure.  TECHNIQUE:   The patient was taken to the operating room and received a general anesthetic.  Left lower extremity was prepped and draped in usual sterile fashion.  I did have looked at the great saphenous vein with the SonoSite and felt that it was an adequate bypass conduit.  A longitudinal incision was made in the left groin.  There were 3 very large lymph nodes present here which I excised and sent to pathology.  Through this incision the common femoral artery was dissected free was markedly calcified and I could not clamp at this level.  I therefore had to dissected well into the inguinal ligament like it clamped the artery proximally.  The superficial femoral artery into deep femoral artery branches were controlled.  Using 3 additional incisions along the medial aspect of the left leg the great saphenous vein was harvested from the saphenofemoral junction to the proximal calf.  Branches were divided between clips and 3-0 silk ties.  Through the distal incision the below-knee  popliteal artery was exposed.  A tunnel was created from the below-knee popliteal artery to the femoral artery and the patient was heparinized.  The vein was removed from its bed and the saphenofemoral junction oversewn with a 5-0 Prolene.  The external iliac artery was clamped proximally and then the superficial femoral and deep femoral arteries control.  A longitudinal arteriotomy was made in the common femoral artery and the vein was sewn in a nonodorous fashion.  The proximal valve was sharply excised.  The vein was spatulated and sewn into side to the artery using continuous 5-0 Prolene suture.  Prior to completing anastomosis the graft was flushed and there was excellent inflow.  The anastomosis was completed.  I then used a retrograde Mills valvulotome to lyse the valves.  There was excellent flow established through the graft which was flushed with heparinized saline and then clipped.  It was then brought to the previously created tunnel after it was marked to prevent twisting.  A tourniquet was then placed on the thigh.  The leg was exsanguinated with an Esmarch bandage.  The tourniquet was inflated to 300 mmHg.  Under tourniquet control a longitudinal arteriotomy was made in the below-knee popliteal artery.  The vein was cut to the appropriate length spatulated and sewn into side to the artery using continuous 6-0 Prolene suture.  Prior to completing anastomosis the artery was backbled and flushed appropriately after the tourniquet was released.  There was excellent flow through the graft.  The anastomosis was completed.  At the completion there was an  excellent signal distal to the anastomosis and a palpable pulse.  However there was a diminished posterior tibial signal with the Doppler.  For this reason I obtained an intraoperative arteriogram.  This showed no technical problems.  Hemostasis was obtained in the wounds.  The groin incision was closed with a deep layer of 2-0 Vicryl, the subcutaneous  layer with 3-0 Vicryl, the skin closed with 4-0 Monocryl.  The remaining incisions were closed with 2 deep layers of 3-0 Vicryl and the skin closed with 4-0 Monocryl.  Dermabond was applied.  The patient tolerated procedure well was transferred to recovery room in stable condition.  All needle and sponge counts were correct.  Given the complexity of the case a first assistant was necessary in order to expedient the procedure and safely perform the technical aspects of the operation.  Deitra Mayo, MD, FACS [facility]  DATE OF DICTATION:   07/23/2020

## 2020-07-29 NOTE — Anesthesia Postprocedure Evaluation (Signed)
Anesthesia Post Note  Patient: Thomas Mcguire  Procedure(s) Performed: LEFT FEMORAL-BELOW KNEE POPLITEAL ARTERY ARTERY BYPASS GRAFT  WITH EXCISION OF INGUINAL LYMPH NODES (Left: Leg Upper) INTRA OPERATIVE ARTERIOGRAM (Left: Leg Upper)     Patient location during evaluation: PACU Anesthesia Type: General Level of consciousness: awake and alert, patient cooperative and oriented Pain management: pain level controlled (pain much improved) Vital Signs Assessment: post-procedure vital signs reviewed and stable Respiratory status: spontaneous breathing, nonlabored ventilation, respiratory function stable and patient connected to nasal cannula oxygen Cardiovascular status: blood pressure returned to baseline and stable Postop Assessment: no apparent nausea or vomiting Anesthetic complications: no   No notable events documented.  Last Vitals:  Vitals:   08/08/2020 1502 07/25/2020 1503  BP: (!) 106/51   Pulse:  76  Resp:  (!) 22  Temp:    SpO2: 100% 100%    Last Pain:  Vitals:   07/28/2020 1301  TempSrc:   PainSc: 10-Worst pain ever                 Carry Ortez,E. Olon Russ

## 2020-07-29 NOTE — Progress Notes (Addendum)
  Progress Note    08/06/2020 4:41 PM Day of Surgery  Subjective:  some burning sensation in left groin incision   Vitals:   07/20/2020 1526 08/09/2020 1546  BP: (!) 124/56 (!) 104/51  Pulse: 62 73  Resp: 20 20  Temp: (!) 97 F (36.1 C) 98.3 F (36.8 C)  SpO2: 95% 98%   Physical Exam: Cardiac:  regular Lungs:  non labored Incisions:  left groin, left thigh incisions all clean, dry and intact. No swelling or hematoma Extremities:  well perfused and warm. Doppler PT and AT signals Neurologic: alert and oriented  CBC    Component Value Date/Time   WBC 10.9 (H) 07/22/2020 0554   RBC 4.04 (L) 07/15/2020 0554   HGB 11.7 (L) 08/01/2020 0554   HGB 12.1 (L) 05/30/2020 1106   HCT 38.5 (L) 08/01/2020 0554   PLT 134 (L) 07/15/2020 0554   PLT 89 (L) 05/30/2020 1106   MCV 95.3 07/15/2020 0554   MCH 29.0 07/16/2020 0554   MCHC 30.4 08/05/2020 0554   RDW 15.9 (H) 07/16/2020 0554   LYMPHSABS 7.5 (H) 05/30/2020 1106   MONOABS 0.6 05/30/2020 1106   EOSABS 0.1 05/30/2020 1106   BASOSABS 0.0 05/30/2020 1106    BMET    Component Value Date/Time   NA 134 (L) 08/02/2020 0554   K 5.1 08/03/2020 0554   CL 96 (L) 07/25/2020 0554   CO2 24 07/22/2020 0554   GLUCOSE 120 (H) 07/16/2020 0554   BUN 54 (H) 07/30/2020 0554   CREATININE 7.07 (H) 08/02/2020 0554   CREATININE 5.72 (HH) 12/28/2017 1009   CALCIUM 9.3 08/01/2020 0554   GFRNONAA 7 (L) 08/06/2020 0554   GFRNONAA 9 (L) 12/28/2017 1009   GFRAA 12 (L) 09/20/2018 1013   GFRAA 10 (L) 12/28/2017 1009    INR    Component Value Date/Time   INR 1.1 07/20/2020 0554     Intake/Output Summary (Last 24 hours) at 07/18/2020 1641 Last data filed at 08/06/2020 1526 Gross per 24 hour  Intake 850 ml  Output 100 ml  Net 750 ml     Assessment/Plan:  80 y.o. male is s/p    Left common femoral to below-knee popliteal artery bypass with long reversed translocated saphenous vein graft Excision of 3 large inguinal lymph  nodes Intraoperative arteriogram   Doing well post op. Pain well controlled. LLE incisions c/d/I. No swelling or hematoma Doppler left PT AT signals Hemodynamically stable Nephrology consulted for inpatient HD, dialysis 7/20 Routine post op care PT/ OT to evaluate tomorrow   Karoline Caldwell, PA-C [facility] [phone] 08/02/2020 4:41 PM  I have interviewed the patient and examined the patient. I agree with the findings by the PA. Good doppler signal left PT. Incisions look fine.   Gae Gallop, MD

## 2020-07-29 NOTE — Consult Note (Addendum)
[facility] Renal Consultation Note    Indication for Consultation:  Management of ESRD/hemodialysis, anemia, hypertension/volume, and secondary hyperparathyroidism. PCP: Dr. Sharlett Iles  HPI: Thomas Mcguire is a 80 y.o. male with ESRD, hypotension (on midodrine TID), T2DM, CLL, and PAD with L foot wound who was admitted s/p L femoral-popliteal bypass graft this morning.  Seen in PACU - c/o leg pain, but otherwise feels well. No CP, dyspnea, abdominal pain, nausea, vomiting, diarrhea, fever or chills. Dialyzes on MWF schedule at [facility]. Had full treatment yesterday -> will be due again tomorrow. Has been using his TDC as access, has useable RUE AVF but resting this s/p recent infiltration. Per OP note, 3 large lymph nodes were dissected and sent for pathology - s/p abdominal CT with bulky abdominal LAD.  Past Medical History:  Diagnosis Date   Anemia    low iron   Arthritis    CHF (congestive heart failure) ([place]) 11/08/2019   chronic combined systolic and diastolic CHF   Chronic renal insufficiency    CHECKED Q3MOS PER PT. Stage 4 as of 05/22/2018 per pt.   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   PAD (peripheral artery disease) ([place])    Wears dentures    Past Surgical History:  Procedure Laterality Date   A/V FISTULAGRAM N/A 08/11/2018   Procedure: A/V FISTULAGRAM - Right Arm;  Surgeon: Angelia Mould, MD;  Location: [place] [facility];  Service: Cardiovascular;  Laterality: N/A;   A/V FISTULAGRAM N/A 09/07/2019   Procedure: A/V FISTULAGRAM - Right AVF;  Surgeon: Angelia Mould, MD;  Location: [place] [facility];  Service: Cardiovascular;  Laterality: N/A;   ABDOMINAL AORTOGRAM W/LOWER EXTREMITY Right 07/03/2018    Procedure: ABDOMINAL AORTOGRAM W/LOWER EXTREMITY;  Surgeon: Waynetta Sandy, MD;  Location: [place] [facility];  Service: Cardiovascular;  Laterality: Right;   ABDOMINAL AORTOGRAM W/LOWER EXTREMITY N/A 02/12/2020   Procedure: ABDOMINAL AORTOGRAM W/LOWER EXTREMITY;  Surgeon: Serafina Mitchell, MD;  Location: [place] [facility];  Service: Cardiovascular;  Laterality: N/A;   ABDOMINAL AORTOGRAM W/LOWER EXTREMITY Left 07/24/2020   Procedure: ABDOMINAL AORTOGRAM W/LOWER EXTREMITY;  Surgeon: [name], MD;  Location: [place] [facility];  Service: Cardiovascular;  Laterality: Left;   AMPUTATION  08/27/2011   Procedure: AMPUTATION DIGIT;  Surgeon: Newt Minion, MD;  Location: [place];  Service: Orthopedics;  Laterality: Left;  Left Foot 3rd toe Amputation MTP joint   AMPUTATION Right 07/11/2018   Procedure: AMPUTATION RIGHT GREAT TOE;  Surgeon: Angelia Mould, MD;  Location: [place];  Service: Vascular;  Laterality: Right;   AMPUTATION Right 02/15/2020   Procedure: TRANSMETATARSAL AMPUTATION;  Surgeon: Newt Minion, MD;  Location: [place];  Service: Orthopedics;  Laterality: Right;   amputation great toe  2009   left   AV FISTULA PLACEMENT Right 05/23/2018   Procedure: RADIAL- CEPHALIC ARTERIOVENOUS (AV) FISTULA CREATION RIGHT ARM AND LIGATION OF COMPETING BRANCH.;  Surgeon: Angelia Mould, MD;  Location: [place];  Service: Vascular;  Laterality: Right;   [place] Right 10/01/2019   Procedure: FIRST STAGE [place];  Surgeon: Angelia Mould, MD;  Location: [place];  Service: Vascular;  Laterality: Right;   BASCILIC VEIN TRANSPOSITION Right 11/13/2019   Procedure: RIGHT SECOND STAGE [place];  Surgeon: Rosetta Posner, MD;  Location: [place];  Service: Vascular;  Laterality: Right;   COLONOSCOPY     cyst elbow  1999   right   FEMORAL-POPLITEAL BYPASS GRAFT Right 07/11/2018   Procedure: BYPASS GRAFT FEMORAL-POPLITEAL ARTERY;   Surgeon: Angelia Mould, MD;  Location: [place];  Service: Vascular;  Laterality: Right;   FOOT SURGERY     both feet for something that was torn   IR FLUORO GUIDE CV LINE RIGHT  11/08/2019   IR US GUIDE VASC ACCESS RIGHT  11/08/2019   LIGATION OF ARTERIOVENOUS  FISTULA Right 10/01/2019   Procedure: LIGATION OF RIGHT RADIOCEPHALIC  ARTERIOVENOUS  FISTULA;  Surgeon: Angelia Mould, MD;  Location: [place];  Service: Vascular;  Laterality: Right;   PERIPHERAL VASCULAR BALLOON ANGIOPLASTY Right 02/12/2020   Procedure: PERIPHERAL VASCULAR BALLOON ANGIOPLASTY;  Surgeon: Serafina Mitchell, MD;  Location: [place] [facility];  Service: Cardiovascular;  Laterality: Right;  peroneal   PERIPHERAL VASCULAR INTERVENTION Right 02/12/2020   Procedure: PERIPHERAL VASCULAR INTERVENTION;  Surgeon: Serafina Mitchell, MD;  Location: [place] [facility];  Service: Cardiovascular;  Laterality: Right;  fem/pop bypass graft   TOE AMPUTATION  2010   left 2nd toe   UPPER GI ENDOSCOPY     Family History  Problem Relation Age of Onset   Colon cancer Neg Hx    Social History:  reports that he quit smoking about 24 years ago. His smoking use included cigarettes. He has never used smokeless tobacco. He reports that he does not drink alcohol and does not use drugs.  ROS: As per HPI otherwise negative.  Physical Exam: Vitals:   07/23/2020 1301 07/30/2020 1315 08/02/2020 1331 07/30/2020 1400  BP: (!) 111/48 (!) 110/50 (!) 112/51 (!) 114/52  Pulse: 76 78 75 81  Resp: 18 (!) 24 (!) 22 (!) 22  Temp:    (!) 97.3 F (36.3 C)  TempSrc:      SpO2: 100% 100% 100% 100%  Weight:      Height:         General: Well developed, well nourished, in no acute distress. Nasal O2 in place. Head: Normocephalic, atraumatic, sclera non-icteric, mucus membranes are moist. Neck: Supple without lymphadenopathy/masses. JVD not elevated. Lungs: Clear bilaterally to auscultation without wheezes, rales, or rhonchi. Breathing is  unlabored. Heart: RRR with normal S1, S2. No murmurs, rubs, or gallops appreciated. Abdomen: Soft, non-tender, non-distended with normoactive bowel sounds.  Musculoskeletal:  Strength and tone appear normal for age. Lower extremities: No LE edema; L leg with ACE wrap from knee down, glued incision medial L thigh Neuro: Alert and oriented X 3. Moves all extremities spontaneously. Psych:  Responds to questions appropriately with a normal affect. Dialysis Access: TDC in R chest + RUE AVF + bruit  No Known Allergies Prior to Admission medications   Medication Sig Start Date End Date Taking? Authorizing Provider  aspirin EC 81 MG EC tablet Take 1 tablet (81 mg total) by mouth daily. Swallow whole. 02/21/20  Yes Shelly Coss, MD  B Complex-C-Folic Acid (NEPHRO-VITE RX PO) Take 1 tablet by mouth at bedtime. 11/27/19  Yes [provider]  folic acid (FOLVITE) 1 MG tablet TAKE 1 TABLET BY MOUTH EVERY DAY Patient taking differently: Take 1 mg by mouth daily. 03/14/20  Yes Ladell Pier, MD  insulin NPH Human (NOVOLIN  N) 100 UNIT/ML injection Inject 0.5 mLs (50 Units total) into the skin daily before breakfast. Patient taking differently: Inject 50 Units into the skin daily as needed (high blood sugar). 11/14/19  Yes Vann, Jessica U, DO  midodrine (PROAMATINE) 10 MG tablet Take 10 mg by mouth 3 (three) times a week. 11/22/19  Yes [provider]  nitroGLYCERIN (NITRODUR - DOSED IN MG/24 HR) 0.2 mg/hr patch Place 1 patch (0.2 mg total) onto the skin daily. 07/11/20  Yes Newt Minion, MD  pravastatin (PRAVACHOL) 40 MG tablet Take 40 mg by mouth at bedtime.   Yes [provider]  sucroferric oxyhydroxide (VELPHORO) 500 MG chewable tablet Chew 1 tablet (500 mg total) by mouth 3 (three) times daily with meals. 02/20/20  Yes Shelly Coss, MD  traZODone (DESYREL) 50 MG tablet Take 50 mg by mouth at bedtime. 03/14/20  Yes [provider]  vitamin B-12 (CYANOCOBALAMIN) 1000  MCG tablet Take 1,000 mcg by mouth daily.   Yes [provider]  Accu-Chek FastClix Lancets MISC Use one lancet 5 times every day  DX:  250.00 12/30/11   [provider]  Blood Glucose Calibration (ACCU-CHEK AVIVA) SOLN U.A.D TO CALIBRATE METER 11/04/11   [provider]  Blood Glucose Monitoring Suppl (ACCU-CHEK AVIVA PLUS) w/Device KIT Use meter to check blood sugar 10/20/11   [provider]  COVID-19 mRNA Vac-TriS, Pfizer, (PFIZER-BIONT COVID-19 VAC-TRIS) SUSP injection Inject into the muscle. 05/30/20   Carlyle Basques, MD  glucose blood (ACCU-CHEK AVIVA PLUS) test strip Check blood sugar 5 times daily (dx E11.49) 10/13/11   [provider]  Insulin Syringe-Needle U-100 31G X 5/16" 0.5 ML MISC Use one new syringe 6 times daily for insulin DX E11.49 02/08/17   [provider]  Lancets Misc. (ACCU-CHEK FASTCLIX LANCET) KIT u.a.d to test BS 5 times daily 11/04/11   [provider]  oxyCODONE (OXY IR/ROXICODONE) 5 MG immediate release tablet Take 1-2 tablets (5-10 mg total) by mouth every 4 (four) hours as needed for moderate pain (pain score 4-6). 02/20/20   Shelly Coss, MD  pentoxifylline (TRENTAL) 400 MG CR tablet Take 1 tablet (400 mg total) by mouth 3 (three) times daily with meals. 07/11/20   Newt Minion, MD   Current Facility-Administered Medications  Medication Dose Route Frequency Provider Last Rate Last Admin   0.9 %  sodium chloride infusion   Intravenous Continuous Angelia Mould, MD 10 mL/hr at 07/23/2020 0700 Restarted at 08/10/2020 0753   0.9 %  sodium chloride infusion   Intravenous Continuous Annye Asa, MD       ceFAZolin (ANCEF) 2-4 GM/100ML-% IVPB            chlorhexidine (PERIDEX) 0.12 % solution            Chlorhexidine Gluconate Cloth 2 % PADS 6 each  6 each Topical Once Angelia Mould, MD       And   Chlorhexidine Gluconate Cloth 2 % PADS 6 each  6 each Topical Once Angelia Mould, MD        fentaNYL (SUBLIMAZE) 100 MCG/2ML injection            fentaNYL (SUBLIMAZE) injection 25-50 mcg  25-50 mcg Intravenous Q5 min PRN Annye Asa, MD   25 mcg at 07/24/2020 1424   oxyCODONE-acetaminophen (PERCOCET/ROXICET) 5-325 MG per tablet 1-2 tablet  1-2 tablet Oral Q4H PRN Karoline Caldwell, PA-C       Labs: Basic Metabolic Panel: Recent Labs  Lab 07/24/20 1044 07/27/20 1926 08/05/2020 0554  NA 137 137 134*  K 4.4 5.5* 5.1  CL 96* 92* 96*  CO2  --  29 24  GLUCOSE 100* 129* 120*  BUN 59* 61* 54*  CREATININE 5.80* 7.84* 7.07*  CALCIUM  --  9.3 9.3   Liver Function Tests: Recent Labs  Lab 07/27/20 1926 07/15/2020 0554  AST 23 17  ALT 11 12  ALKPHOS 48 51  BILITOT 0.9 1.1  PROT 6.8 6.0*  ALBUMIN 4.0 3.2*   Recent Labs  Lab 07/27/20 1926  LIPASE 37   CBC: Recent Labs  Lab 07/24/20 1044 07/27/20 1926 08/09/2020 0554  WBC  --  13.0* 10.9*  HGB 14.3 12.8* 11.7*  HCT 42.0 40.8 38.5*  MCV  --  94.2 95.3  PLT  --  131* 134*   CBG: Recent Labs  Lab 07/24/20 1500 07/24/20 1548 07/26/2020 0540 07/28/2020 1256  GLUCAP 95 93 130* 183*   Studies/Results: DG Chest 2 View  Result Date: 07/27/2020 CLINICAL DATA:  Dyspnea EXAM: CHEST - 2 VIEW COMPARISON:  02/14/2020 FINDINGS: Right-sided central venous catheter tip over the SVC. Cardiomegaly with vascular congestion and mild pulmonary edema. Small right pleural effusion. Aortic atherosclerosis. No pneumothorax IMPRESSION: Cardiomegaly with vascular congestion, mild pulmonary edema and small right pleural effusion Electronically Signed   By: Donavan Foil M.D.   On: 07/27/2020 20:49   CT ABDOMEN PELVIS W CONTRAST  Result Date: 07/27/2020 CLINICAL DATA:  Left upper quadrant pain EXAM: CT ABDOMEN AND PELVIS WITH CONTRAST TECHNIQUE: Multidetector CT imaging of the abdomen and pelvis was performed using the standard protocol following bolus administration of intravenous contrast. CONTRAST:  134m OMNIPAQUE IOHEXOL 300 MG/ML  SOLN  COMPARISON:  CT 02/27/2018, chest CT 05/14/2020 FINDINGS: Lower chest: Lung bases demonstrate mild septal thickening. Cardiomegaly. Small right-sided pleural effusion. Hepatobiliary: No focal hepatic abnormality. Small calcified gallstones. No biliary dilatation Pancreas: Unremarkable. No pancreatic ductal dilatation or surrounding inflammatory changes. Spleen: Enlarged, measuring 15 cm AP Adrenals/Urinary Tract: Right adrenal gland normal. 19 mm left adrenal nodule without significant change. Cortical atrophy of the kidneys. Heterogenous renal enhancement with multiple low-density lesions too small to further characterize. Few renal cysts. Delayed nephrogram and poor excretion of contrast consistent with decreased renal function. Mild hyperdensity within the bladder lumen which is incompletely distended Stomach/Bowel: Stomach nonenlarged. No dilated small bowel. No acute bowel wall thickening. Negative appendix Vascular/Lymphatic: Advanced aortic atherosclerosis without aneurysm. Extensive abdominopelvic adenopathy. Multiple enlarged gastrohepatic, porta hepatis, peripancreatic and retroperitoneal nodes, for example 3 cm porta hepatis node series 2, image 35. Bulky Peri aortic adenopathy. Numerous enlarged mesenteric nodes. Increased external iliac adenopathy measuring 24 mm on the right and 22 mm on the left, previously 16 mm and 11 mm respectively. Bilateral inguinal adenopathy also increased compared to prior Reproductive: Prostate slightly enlarged Other: Negative for free air or pelvic effusion. Subcutaneous thickening of the anterior pelvic wall. Musculoskeletal: Stable small sclerotic lesion is in left iliac bone. Chronic bilateral pars defect at L5 with trace anterolisthesis. IMPRESSION: 1. Extensive bulky abdominal, retroperitoneal, pelvic and inguinal adenopathy, progressed as compared with 2020 CT and felt suspicious for lymphoproliferative disease/lymphoma/leukemia versus metastatic disease.  Splenomegaly. 2. Cardiomegaly with small right pleural effusion and mild diffuse septal thickening which could be secondary to edema 3. Delayed excretion of contrast from the kidneys suggesting decreased renal function. Correlate with appropriate laboratory value 4. Hyperdense material within the lumen of the bladder, doubt contrast given lack of excretion from kidneys. Could represent  complex or hemorrhagic debris. 5. Gallstones Electronically Signed   By: Donavan Foil M.D.   On: 07/27/2020 20:45    Dialysis Orders: C MWF at [facility] ([facility] patient) 4hr, 450/800, EDW 77kg, 2K/2.5Ca, UFP #2, TDC/AVF, heparin 4000 bolus - No ESA or VDRA  Assessment/Plan:  PAD/L foot wound: S/p L fem-pop bypass by Dr. Scot Dock on 7/19. Per VVS. Foot wound bandaged and not examined today.  Extensive abdominal LAD/CLL:  Per Op note, 3 large inguinal nodes were dissected for path. Saw oncologist yesterday, plan for f/u in few weeks after recovers from surgery to consider tyrosine kinase inhibitor.  ESRD:  Continue HD per usual MWF schedule -> HD tomorrow, no heparin s/p surgery.  Hypotension/volume: Chronic low BP, on mido. UF as tolerated.  Anemia: Hgb 11.7, no need for ESA  Metabolic bone disease: Ca ok, Phos pending. No VDRA, continue home binder  Nutrition:  Alb low, adding protein supps.  T2DM  Veneta Penton, Hershal Coria 07/11/2020, 2:53 PM  [facility]  I have seen and examined this patient and agree with plan and assessment in the above note with renal recommendations/intervention highlighted. Pt found to have 3 large inguinal lymph nodes which were resected given history of CLL.  Plan for HD tomorrow to keep on his outpatient schedule.  Governor Rooks Ondrea Dow,MD 08/02/2020 3:42 PM

## 2020-07-29 NOTE — Interval H&P Note (Signed)
History and Physical Interval Note:  08/01/2020 7:15 AM  Thomas Mcguire  has presented today for surgery, with the diagnosis of PAD.  The various methods of treatment have been discussed with the patient and family. After consideration of risks, benefits and other options for treatment, the patient has consented to  Procedure(s): LEFT FEMORAL-POPLITEAL ARTERY ARTERY BYPASS GRAFTING (Left) as a surgical intervention.  The patient's history has been reviewed, patient examined, no change in status, stable for surgery.  I have reviewed the patient's chart and labs.  Questions were answered to the patient's satisfaction.     Deitra Mayo

## 2020-07-29 NOTE — Progress Notes (Signed)
Pt  arrived to rm 5 from PACU. Intimidated tele. CHG wipe given. VSS. Oriented pt to the unit. Will continue to monitor the pt.    Lavenia Atlas, RN

## 2020-07-29 NOTE — Anesthesia Procedure Notes (Signed)
Procedure Name: Intubation Date/Time: 08/05/2020 8:07 AM Performed by: Annye Asa, MD Pre-anesthesia Checklist: Patient identified, Emergency Drugs available, Suction available and Patient being monitored Patient Re-evaluated:Patient Re-evaluated prior to induction Oxygen Delivery Method: Circle system utilized Preoxygenation: Pre-oxygenation with 100% oxygen Induction Type: IV induction Ventilation: Mask ventilation without difficulty Laryngoscope Size: Mac and 4 Grade View: Grade I Tube type: Oral Tube size: 7.5 mm Number of attempts: 1 Airway Equipment and Method: Stylet and Oral airway Placement Confirmation: ETT inserted through vocal cords under direct vision, positive ETCO2 and breath sounds checked- equal and bilateral Secured at: 24 cm Tube secured with: Tape Dental Injury: Teeth and Oropharynx as per pre-operative assessment  Comments: Inserted by Paulina Fusi, SRNA

## 2020-07-30 ENCOUNTER — Inpatient Hospital Stay (HOSPITAL_COMMUNITY): Payer: Medicare Other

## 2020-07-30 ENCOUNTER — Encounter (HOSPITAL_COMMUNITY): Payer: Self-pay | Admitting: Vascular Surgery

## 2020-07-30 DIAGNOSIS — R570 Cardiogenic shock: Secondary | ICD-10-CM | POA: Diagnosis not present

## 2020-07-30 DIAGNOSIS — R9431 Abnormal electrocardiogram [ECG] [EKG]: Secondary | ICD-10-CM | POA: Diagnosis not present

## 2020-07-30 DIAGNOSIS — T8111XA Postprocedural  cardiogenic shock, initial encounter: Secondary | ICD-10-CM | POA: Diagnosis present

## 2020-07-30 DIAGNOSIS — J9601 Acute respiratory failure with hypoxia: Secondary | ICD-10-CM | POA: Diagnosis not present

## 2020-07-30 DIAGNOSIS — I739 Peripheral vascular disease, unspecified: Secondary | ICD-10-CM

## 2020-07-30 DIAGNOSIS — L8990 Pressure ulcer of unspecified site, unspecified stage: Secondary | ICD-10-CM | POA: Insufficient documentation

## 2020-07-30 DIAGNOSIS — I4891 Unspecified atrial fibrillation: Secondary | ICD-10-CM

## 2020-07-30 DIAGNOSIS — R6521 Severe sepsis with septic shock: Secondary | ICD-10-CM

## 2020-07-30 DIAGNOSIS — I5043 Acute on chronic combined systolic (congestive) and diastolic (congestive) heart failure: Secondary | ICD-10-CM | POA: Diagnosis not present

## 2020-07-30 DIAGNOSIS — A419 Sepsis, unspecified organism: Secondary | ICD-10-CM | POA: Diagnosis not present

## 2020-07-30 DIAGNOSIS — I779 Disorder of arteries and arterioles, unspecified: Secondary | ICD-10-CM

## 2020-07-30 LAB — COMPREHENSIVE METABOLIC PANEL
ALT: 19 U/L (ref 0–44)
AST: 80 U/L — ABNORMAL HIGH (ref 15–41)
Albumin: 2.8 g/dL — ABNORMAL LOW (ref 3.5–5.0)
Alkaline Phosphatase: 42 U/L (ref 38–126)
Anion gap: 21 — ABNORMAL HIGH (ref 5–15)
BUN: 71 mg/dL — ABNORMAL HIGH (ref 8–23)
CO2: 17 mmol/L — ABNORMAL LOW (ref 22–32)
Calcium: 8.2 mg/dL — ABNORMAL LOW (ref 8.9–10.3)
Chloride: 98 mmol/L (ref 98–111)
Creatinine, Ser: 8.55 mg/dL — ABNORMAL HIGH (ref 0.61–1.24)
GFR, Estimated: 6 mL/min — ABNORMAL LOW (ref >60)
Glucose, Bld: 102 mg/dL — ABNORMAL HIGH (ref 70–99)
Potassium: 4.6 mmol/L (ref 3.5–5.1)
Sodium: 136 mmol/L (ref 135–145)
Total Bilirubin: 1.5 mg/dL — ABNORMAL HIGH (ref 0.3–1.2)
Total Protein: 5.3 g/dL — ABNORMAL LOW (ref 6.5–8.1)

## 2020-07-30 LAB — POCT I-STAT 7, (LYTES, BLD GAS, ICA,H+H)
Acid-base deficit: 9 mmol/L — ABNORMAL HIGH (ref 0.0–2.0)
Acid-base deficit: 9 mmol/L — ABNORMAL HIGH (ref 0.0–2.0)
Acid-base deficit: 12 mmol/L — ABNORMAL HIGH (ref 0.0–2.0)
Bicarbonate: 18.6 mmol/L — ABNORMAL LOW (ref 20.0–28.0)
Bicarbonate: 17 mmol/L — ABNORMAL LOW (ref 20.0–28.0)
Bicarbonate: 14 mmol/L — ABNORMAL LOW (ref 20.0–28.0)
Calcium, Ion: 1.09 mmol/L — ABNORMAL LOW (ref 1.15–1.40)
Calcium, Ion: 1.04 mmol/L — ABNORMAL LOW (ref 1.15–1.40)
Calcium, Ion: 1.02 mmol/L — ABNORMAL LOW (ref 1.15–1.40)
HCT: 33 % — ABNORMAL LOW (ref 39.0–52.0)
HCT: 33 % — ABNORMAL LOW (ref 39.0–52.0)
HCT: 35 % — ABNORMAL LOW (ref 39.0–52.0)
Hemoglobin: 11.2 g/dL — ABNORMAL LOW (ref 13.0–17.0)
Hemoglobin: 11.2 g/dL — ABNORMAL LOW (ref 13.0–17.0)
Hemoglobin: 11.9 g/dL — ABNORMAL LOW (ref 13.0–17.0)
O2 Saturation: 92 %
O2 Saturation: 90 %
O2 Saturation: 90 %
Patient temperature: 102.3
Patient temperature: 99.9
Patient temperature: 99
Potassium: 4.5 mmol/L (ref 3.5–5.1)
Potassium: 4.7 mmol/L (ref 3.5–5.1)
Potassium: 4.3 mmol/L (ref 3.5–5.1)
Sodium: 134 mmol/L — ABNORMAL LOW (ref 135–145)
Sodium: 132 mmol/L — ABNORMAL LOW (ref 135–145)
Sodium: 133 mmol/L — ABNORMAL LOW (ref 135–145)
TCO2: 20 mmol/L — ABNORMAL LOW (ref 22–32)
TCO2: 18 mmol/L — ABNORMAL LOW (ref 22–32)
TCO2: 15 mmol/L — ABNORMAL LOW (ref 22–32)
pCO2 arterial: 49.5 mmHg — ABNORMAL HIGH (ref 32.0–48.0)
pCO2 arterial: 37.2 mmHg (ref 32.0–48.0)
pCO2 arterial: 31.6 mmHg — ABNORMAL LOW (ref 32.0–48.0)
pH, Arterial: 7.194 — CL (ref 7.350–7.450)
pH, Arterial: 7.271 — ABNORMAL LOW (ref 7.350–7.450)
pH, Arterial: 7.257 — ABNORMAL LOW (ref 7.350–7.450)
pO2, Arterial: 89 mmHg (ref 83.0–108.0)
pO2, Arterial: 68 mmHg — ABNORMAL LOW (ref 83.0–108.0)
pO2, Arterial: 67 mmHg — ABNORMAL LOW (ref 83.0–108.0)

## 2020-07-30 LAB — CBC
HCT: 38.9 % — ABNORMAL LOW (ref 39.0–52.0)
HCT: 32.8 % — ABNORMAL LOW (ref 39.0–52.0)
HCT: 37.2 % — ABNORMAL LOW (ref 39.0–52.0)
Hemoglobin: 11.9 g/dL — ABNORMAL LOW (ref 13.0–17.0)
Hemoglobin: 9.6 g/dL — ABNORMAL LOW (ref 13.0–17.0)
Hemoglobin: 11.1 g/dL — ABNORMAL LOW (ref 13.0–17.0)
MCH: 29.6 pg (ref 26.0–34.0)
MCH: 28.7 pg (ref 26.0–34.0)
MCH: 29.1 pg (ref 26.0–34.0)
MCHC: 30.6 g/dL (ref 30.0–36.0)
MCHC: 29.3 g/dL — ABNORMAL LOW (ref 30.0–36.0)
MCHC: 29.8 g/dL — ABNORMAL LOW (ref 30.0–36.0)
MCV: 96.8 fL (ref 80.0–100.0)
MCV: 98.2 fL (ref 80.0–100.0)
MCV: 97.6 fL (ref 80.0–100.0)
Platelets: 157 10*3/uL (ref 150–400)
Platelets: 117 10*3/uL — ABNORMAL LOW (ref 150–400)
Platelets: 102 10*3/uL — ABNORMAL LOW (ref 150–400)
RBC: 4.02 MIL/uL — ABNORMAL LOW (ref 4.22–5.81)
RBC: 3.34 MIL/uL — ABNORMAL LOW (ref 4.22–5.81)
RBC: 3.81 MIL/uL — ABNORMAL LOW (ref 4.22–5.81)
RDW: 16 % — ABNORMAL HIGH (ref 11.5–15.5)
RDW: 16 % — ABNORMAL HIGH (ref 11.5–15.5)
RDW: 16.2 % — ABNORMAL HIGH (ref 11.5–15.5)
WBC: 17.5 10*3/uL — ABNORMAL HIGH (ref 4.0–10.5)
WBC: 49.2 10*3/uL — ABNORMAL HIGH (ref 4.0–10.5)
WBC: 54 10*3/uL (ref 4.0–10.5)
nRBC: 0.3 % — ABNORMAL HIGH (ref 0.0–0.2)
nRBC: 1.2 % — ABNORMAL HIGH (ref 0.0–0.2)
nRBC: 1.5 % — ABNORMAL HIGH (ref 0.0–0.2)

## 2020-07-30 LAB — RENAL FUNCTION PANEL
Albumin: 2.8 g/dL — ABNORMAL LOW (ref 3.5–5.0)
Anion gap: 19 — ABNORMAL HIGH (ref 5–15)
BUN: 71 mg/dL — ABNORMAL HIGH (ref 8–23)
CO2: 19 mmol/L — ABNORMAL LOW (ref 22–32)
Calcium: 8.4 mg/dL — ABNORMAL LOW (ref 8.9–10.3)
Chloride: 98 mmol/L (ref 98–111)
Creatinine, Ser: 8.98 mg/dL — ABNORMAL HIGH (ref 0.61–1.24)
GFR, Estimated: 6 mL/min — ABNORMAL LOW (ref >60)
Glucose, Bld: 91 mg/dL (ref 70–99)
Phosphorus: 6.8 mg/dL — ABNORMAL HIGH (ref 2.5–4.6)
Potassium: 4.9 mmol/L (ref 3.5–5.1)
Sodium: 136 mmol/L (ref 135–145)

## 2020-07-30 LAB — GLUCOSE, CAPILLARY
Glucose-Capillary: 196 mg/dL — ABNORMAL HIGH (ref 70–99)
Glucose-Capillary: 77 mg/dL (ref 70–99)
Glucose-Capillary: 92 mg/dL (ref 70–99)
Glucose-Capillary: 86 mg/dL (ref 70–99)
Glucose-Capillary: 78 mg/dL (ref 70–99)

## 2020-07-30 LAB — ECHOCARDIOGRAM COMPLETE
Area-P 1/2: 4.65 cm2
Calc EF: 15.9 %
Height: 78 in
Single Plane A2C EF: 16.9 %
Single Plane A4C EF: 15 %
Weight: 2804.25 oz

## 2020-07-30 LAB — LACTIC ACID, PLASMA
Lactic Acid, Venous: 8.1 mmol/L (ref 0.5–1.9)
Lactic Acid, Venous: 7.9 mmol/L (ref 0.5–1.9)
Lactic Acid, Venous: 6.8 mmol/L (ref 0.5–1.9)

## 2020-07-30 LAB — LIPID PANEL
Cholesterol: 101 mg/dL (ref 0–200)
HDL: 32 mg/dL — ABNORMAL LOW (ref >40)
LDL Cholesterol: 46 mg/dL (ref 0–99)
Total CHOL/HDL Ratio: 3.2 RATIO
Triglycerides: 116 mg/dL (ref <150)
VLDL: 23 mg/dL (ref 0–40)

## 2020-07-30 LAB — HEPARIN LEVEL (UNFRACTIONATED): Heparin Unfractionated: 0.36 IU/mL (ref 0.30–0.70)

## 2020-07-30 LAB — BASIC METABOLIC PANEL
Anion gap: 21 — ABNORMAL HIGH (ref 5–15)
Anion gap: 19 — ABNORMAL HIGH (ref 5–15)
BUN: 70 mg/dL — ABNORMAL HIGH (ref 8–23)
BUN: 65 mg/dL — ABNORMAL HIGH (ref 8–23)
CO2: 21 mmol/L — ABNORMAL LOW (ref 22–32)
CO2: 16 mmol/L — ABNORMAL LOW (ref 22–32)
Calcium: 9.2 mg/dL (ref 8.9–10.3)
Calcium: 7.7 mg/dL — ABNORMAL LOW (ref 8.9–10.3)
Chloride: 91 mmol/L — ABNORMAL LOW (ref 98–111)
Chloride: 102 mmol/L (ref 98–111)
Creatinine, Ser: 8.95 mg/dL — ABNORMAL HIGH (ref 0.61–1.24)
Creatinine, Ser: 8.04 mg/dL — ABNORMAL HIGH (ref 0.61–1.24)
GFR, Estimated: 6 mL/min — ABNORMAL LOW (ref >60)
GFR, Estimated: 6 mL/min — ABNORMAL LOW (ref >60)
Glucose, Bld: 228 mg/dL — ABNORMAL HIGH (ref 70–99)
Glucose, Bld: 94 mg/dL (ref 70–99)
Potassium: 6.6 mmol/L (ref 3.5–5.1)
Potassium: 4.7 mmol/L (ref 3.5–5.1)
Sodium: 133 mmol/L — ABNORMAL LOW (ref 135–145)
Sodium: 137 mmol/L (ref 135–145)

## 2020-07-30 LAB — COOXEMETRY PANEL
Carboxyhemoglobin: 0.6 % (ref 0.5–1.5)
Carboxyhemoglobin: 0.4 % — ABNORMAL LOW (ref 0.5–1.5)
Carboxyhemoglobin: 0.3 % — ABNORMAL LOW (ref 0.5–1.5)
Methemoglobin: 1 % (ref 0.0–1.5)
Methemoglobin: 1 % (ref 0.0–1.5)
Methemoglobin: 1 % (ref 0.0–1.5)
O2 Saturation: 52.3 %
O2 Saturation: 58.9 %
O2 Saturation: 68 %
Total hemoglobin: 10.7 g/dL — ABNORMAL LOW (ref 12.0–16.0)
Total hemoglobin: 14.3 g/dL (ref 12.0–16.0)
Total hemoglobin: 11.1 g/dL — ABNORMAL LOW (ref 12.0–16.0)

## 2020-07-30 LAB — PATHOLOGIST SMEAR REVIEW

## 2020-07-30 LAB — TROPONIN I (HIGH SENSITIVITY)
Troponin I (High Sensitivity): 598 ng/L (ref <18)
Troponin I (High Sensitivity): 2838 ng/L (ref <18)

## 2020-07-30 MED ORDER — HEPARIN (PORCINE) 2000 UNITS/L FOR CRRT: INTRAVENOUS_CENTRAL | Status: DC | PRN

## 2020-07-30 MED ORDER — ETOMIDATE 2 MG/ML IV SOLN
INTRAVENOUS | Status: AC
  Filled 2020-07-30: qty 20

## 2020-07-30 MED ORDER — HYDROMORPHONE HCL 1 MG/ML IJ SOLN
0.5000 mg | Freq: Once | INTRAMUSCULAR | Status: AC
  Administered 2020-07-30: 0.5 mg via INTRAVENOUS
  Filled 2020-07-30: qty 0.5

## 2020-07-30 MED ORDER — NOREPINEPHRINE 16 MG/250ML-% IV SOLN
0.0000 ug/min | INTRAVENOUS | Status: DC
Start: 2020-07-30 — End: 2020-07-31
  Administered 2020-07-30: 10 ug/min via INTRAVENOUS
  Administered 2020-07-30: 50 ug/min via INTRAVENOUS
  Administered 2020-07-31: 60 ug/min via INTRAVENOUS
  Filled 2020-07-30 (×3): qty 250

## 2020-07-30 MED ORDER — PIPERACILLIN-TAZOBACTAM 3.375 G IVPB 30 MIN
3.3750 g | Freq: Four times a day (QID) | INTRAVENOUS | Status: DC
  Administered 2020-07-30 (×2): 3.375 g via INTRAVENOUS
  Filled 2020-07-30 (×6): qty 50

## 2020-07-30 MED ORDER — ROCURONIUM BROMIDE 10 MG/ML (PF) SYRINGE
PREFILLED_SYRINGE | INTRAVENOUS | Status: AC
  Filled 2020-07-30: qty 10

## 2020-07-30 MED ORDER — ASPIRIN 81 MG PO CHEW: 81.0000 mg | CHEWABLE_TABLET | Freq: Every day | ORAL | Status: DC

## 2020-07-30 MED ORDER — EPINEPHRINE HCL 5 MG/250ML IV SOLN IN NS
0.5000 ug/min | INTRAVENOUS | Status: DC
Start: 2020-07-30 — End: 2020-07-31
  Administered 2020-07-30: 0.5 ug/min via INTRAVENOUS
  Administered 2020-07-31: 40 ug/min via INTRAVENOUS
  Filled 2020-07-30 (×2): qty 250

## 2020-07-30 MED ORDER — VITAL HIGH PROTEIN PO LIQD
1000.0000 mL | ORAL | Status: DC
  Administered 2020-07-30: 1000 mL

## 2020-07-30 MED ORDER — SODIUM BICARBONATE 8.4 % IV SOLN
INTRAVENOUS | Status: AC
  Administered 2020-07-31: 50 meq via INTRAVENOUS
  Filled 2020-07-30: qty 100

## 2020-07-30 MED ORDER — TRAZODONE HCL 50 MG PO TABS
50.0000 mg | ORAL_TABLET | Freq: Every day | ORAL | Status: DC
  Administered 2020-07-30: 50 mg
  Filled 2020-07-30: qty 1

## 2020-07-30 MED ORDER — ALBUMIN HUMAN 5 % IV SOLN
12.5000 g | Freq: Once | INTRAVENOUS | Status: AC
  Administered 2020-07-30: 12.5 g via INTRAVENOUS
  Filled 2020-07-30: qty 250

## 2020-07-30 MED ORDER — PRISMASOL BGK 0/2.5 32-2.5 MEQ/L REPLACEMENT SOLN
Status: DC
  Filled 2020-07-30 (×3): qty 5000

## 2020-07-30 MED ORDER — MIDAZOLAM HCL 2 MG/2ML IJ SOLN
INTRAMUSCULAR | Status: AC
  Filled 2020-07-30: qty 2

## 2020-07-30 MED ORDER — PANTOPRAZOLE SODIUM 40 MG PO PACK: 40.0000 mg | PACK | Freq: Every day | ORAL | Status: DC

## 2020-07-30 MED ORDER — MIDAZOLAM HCL 2 MG/2ML IJ SOLN
INTRAMUSCULAR | Status: AC
  Administered 2020-07-30: 2 mg
  Filled 2020-07-30: qty 2

## 2020-07-30 MED ORDER — FOLIC ACID 1 MG PO TABS: 1.0000 mg | ORAL_TABLET | Freq: Every day | ORAL | Status: DC

## 2020-07-30 MED ORDER — NOREPINEPHRINE 4 MG/250ML-% IV SOLN: 0.0000 ug/min | INTRAVENOUS | Status: DC

## 2020-07-30 MED ORDER — FENTANYL CITRATE (PF) 100 MCG/2ML IJ SOLN: 100.0000 ug | Freq: Once | INTRAMUSCULAR | Status: AC

## 2020-07-30 MED ORDER — CHLORHEXIDINE GLUCONATE 0.12% ORAL RINSE (MEDLINE KIT)
15.0000 mL | Freq: Two times a day (BID) | OROMUCOSAL | Status: DC
  Administered 2020-07-30: 15 mL via OROMUCOSAL

## 2020-07-30 MED ORDER — INSULIN ASPART 100 UNIT/ML IJ SOLN
10.0000 [IU] | Freq: Once | INTRAMUSCULAR | Status: AC
  Administered 2020-07-30: 10 [IU] via SUBCUTANEOUS

## 2020-07-30 MED ORDER — CLOPIDOGREL BISULFATE 75 MG PO TABS: 75.0000 mg | ORAL_TABLET | Freq: Every day | ORAL | Status: DC

## 2020-07-30 MED ORDER — CLOPIDOGREL BISULFATE 300 MG PO TABS
300.0000 mg | ORAL_TABLET | Freq: Once | ORAL | Status: AC
  Administered 2020-07-30: 300 mg
  Filled 2020-07-30: qty 1

## 2020-07-30 MED ORDER — DOCUSATE SODIUM 50 MG/5ML PO LIQD
100.0000 mg | Freq: Two times a day (BID) | ORAL | Status: DC
  Administered 2020-07-30 (×2): 100 mg
  Filled 2020-07-30 (×2): qty 10

## 2020-07-30 MED ORDER — HEPARIN BOLUS VIA INFUSION
4000.0000 [IU] | Freq: Once | INTRAVENOUS | Status: AC
  Administered 2020-07-30: 4000 [IU] via INTRAVENOUS
  Filled 2020-07-30: qty 4000

## 2020-07-30 MED ORDER — ONDANSETRON HCL 4 MG/2ML IJ SOLN
INTRAMUSCULAR | Status: AC
  Administered 2020-07-30: 4 mg via INTRAVENOUS
  Filled 2020-07-30: qty 2

## 2020-07-30 MED ORDER — ETOMIDATE 2 MG/ML IV SOLN: 20.0000 mg | Freq: Once | INTRAVENOUS | Status: AC

## 2020-07-30 MED ORDER — CALCIUM GLUCONATE-NACL 1-0.675 GM/50ML-% IV SOLN
1.0000 g | Freq: Once | INTRAVENOUS | Status: AC
  Administered 2020-07-30: 1000 mg via INTRAVENOUS
  Filled 2020-07-30: qty 50

## 2020-07-30 MED ORDER — ASPIRIN 81 MG PO CHEW
162.0000 mg | CHEWABLE_TABLET | Freq: Once | ORAL | Status: AC
  Administered 2020-07-30: 162 mg
  Filled 2020-07-30: qty 2

## 2020-07-30 MED ORDER — PRAVASTATIN SODIUM 40 MG PO TABS
40.0000 mg | ORAL_TABLET | Freq: Every day | ORAL | Status: DC
  Administered 2020-07-30: 40 mg
  Filled 2020-07-30: qty 1

## 2020-07-30 MED ORDER — HEPARIN SODIUM (PORCINE) 1000 UNIT/ML DIALYSIS
1000.0000 [IU] | INTRAMUSCULAR | Status: DC | PRN
Start: 2020-07-30 — End: 2020-07-31
  Filled 2020-07-30: qty 6

## 2020-07-30 MED ORDER — PRISMASOL BGK 0/2.5 32-2.5 MEQ/L EC SOLN
Status: DC
  Filled 2020-07-30 (×12): qty 5000

## 2020-07-30 MED ORDER — VASOPRESSIN 20 UNITS/100 ML INFUSION FOR SHOCK
0.0000 [IU]/min | INTRAVENOUS | Status: DC
  Administered 2020-07-30: 0.03 [IU]/min via INTRAVENOUS
  Administered 2020-07-30: 0.04 [IU]/min via INTRAVENOUS
  Filled 2020-07-30: qty 200
  Filled 2020-07-30 (×2): qty 100

## 2020-07-30 MED ORDER — PIPERACILLIN-TAZOBACTAM IN DEX 2-0.25 GM/50ML IV SOLN
2.2500 g | Freq: Three times a day (TID) | INTRAVENOUS | Status: DC
  Administered 2020-07-30: 2.25 g via INTRAVENOUS
  Filled 2020-07-30 (×3): qty 50

## 2020-07-30 MED ORDER — SODIUM CHLORIDE 0.9% FLUSH: 10.0000 mL | INTRAVENOUS | Status: DC | PRN

## 2020-07-30 MED ORDER — DILTIAZEM HCL-DEXTROSE 125-5 MG/125ML-% IV SOLN (PREMIX)
5.0000 mg/h | INTRAVENOUS | Status: DC
  Filled 2020-07-30: qty 125

## 2020-07-30 MED ORDER — RENA-VITE PO TABS
1.0000 | ORAL_TABLET | Freq: Every day | ORAL | Status: DC
  Administered 2020-07-30: 1
  Filled 2020-07-30: qty 1

## 2020-07-30 MED ORDER — MILRINONE LACTATE IN DEXTROSE 20-5 MG/100ML-% IV SOLN
0.2500 ug/kg/min | INTRAVENOUS | Status: DC
  Administered 2020-07-30: 0.25 ug/kg/min via INTRAVENOUS
  Filled 2020-07-30: qty 100

## 2020-07-30 MED ORDER — ROCURONIUM BROMIDE 10 MG/ML (PF) SYRINGE: 50.0000 mg | PREFILLED_SYRINGE | Freq: Once | INTRAVENOUS | Status: DC

## 2020-07-30 MED ORDER — POLYETHYLENE GLYCOL 3350 17 G PO PACK
17.0000 g | PACK | Freq: Every day | ORAL | Status: DC
  Administered 2020-07-30: 17 g
  Filled 2020-07-30: qty 1

## 2020-07-30 MED ORDER — SODIUM CHLORIDE 0.9 % IV BOLUS
500.0000 mL | Freq: Once | INTRAVENOUS | Status: AC
  Administered 2020-07-30: 500 mL via INTRAVENOUS

## 2020-07-30 MED ORDER — DEXTROSE 50 % IV SOLN
INTRAVENOUS | Status: AC
  Filled 2020-07-30: qty 50

## 2020-07-30 MED ORDER — DEXTROSE 50 % IV SOLN
50.0000 mL | Freq: Once | INTRAVENOUS | Status: AC
  Administered 2020-07-30: 50 mL via INTRAVENOUS

## 2020-07-30 MED ORDER — DEXTROSE 5 % IV SOLN: INTRAVENOUS | Status: DC

## 2020-07-30 MED ORDER — MIDODRINE HCL 5 MG PO TABS: 10.0000 mg | ORAL_TABLET | ORAL | Status: DC

## 2020-07-30 MED ORDER — VANCOMYCIN HCL 2000 MG/400ML IV SOLN
2000.0000 mg | Freq: Once | INTRAVENOUS | Status: AC
  Administered 2020-07-30: 2000 mg via INTRAVENOUS
  Filled 2020-07-30: qty 400

## 2020-07-30 MED ORDER — ALBUMIN HUMAN 25 % IV SOLN
INTRAVENOUS | Status: AC
  Administered 2020-07-30: 25 g
  Filled 2020-07-30: qty 100

## 2020-07-30 MED ORDER — FENTANYL CITRATE (PF) 100 MCG/2ML IJ SOLN
INTRAMUSCULAR | Status: AC
  Administered 2020-07-30: 100 ug
  Filled 2020-07-30: qty 2

## 2020-07-30 MED ORDER — KETAMINE HCL 50 MG/5ML IJ SOSY
PREFILLED_SYRINGE | INTRAMUSCULAR | Status: AC
  Filled 2020-07-30: qty 5

## 2020-07-30 MED ORDER — SODIUM CHLORIDE 0.9 % IV SOLN
0.5000 mg/h | INTRAVENOUS | Status: DC
  Administered 2020-07-30: 0.5 mg/h via INTRAVENOUS
  Filled 2020-07-30: qty 5

## 2020-07-30 MED ORDER — AMIODARONE LOAD VIA INFUSION
150.0000 mg | Freq: Once | INTRAVENOUS | Status: AC
  Administered 2020-07-30: 150 mg via INTRAVENOUS
  Filled 2020-07-30: qty 83.34

## 2020-07-30 MED ORDER — MIDAZOLAM HCL 2 MG/2ML IJ SOLN: 2.0000 mg | Freq: Once | INTRAMUSCULAR | Status: AC

## 2020-07-30 MED ORDER — SODIUM CHLORIDE 0.9% FLUSH: 10.0000 mL | Freq: Two times a day (BID) | INTRAVENOUS | Status: DC

## 2020-07-30 MED ORDER — FENTANYL CITRATE (PF) 100 MCG/2ML IJ SOLN
INTRAMUSCULAR | Status: AC
  Filled 2020-07-30: qty 2

## 2020-07-30 MED ORDER — VANCOMYCIN HCL IN DEXTROSE 750-5 MG/150ML-% IV SOLN: 750.0000 mg | INTRAVENOUS | Status: DC

## 2020-07-30 MED ORDER — AMIODARONE HCL IN DEXTROSE 360-4.14 MG/200ML-% IV SOLN
60.0000 mg/h | INTRAVENOUS | Status: AC
  Administered 2020-07-30 (×2): 60 mg/h via INTRAVENOUS
  Filled 2020-07-30: qty 200

## 2020-07-30 MED ORDER — ETOMIDATE 2 MG/ML IV SOLN
INTRAVENOUS | Status: AC
  Administered 2020-07-30: 20 mg
  Filled 2020-07-30: qty 20

## 2020-07-30 MED ORDER — AMIODARONE HCL IN DEXTROSE 360-4.14 MG/200ML-% IV SOLN
30.0000 mg/h | INTRAVENOUS | Status: DC
  Administered 2020-07-30: 30 mg/h via INTRAVENOUS

## 2020-07-30 MED ORDER — HYDROMORPHONE BOLUS VIA INFUSION
0.2500 mg | INTRAVENOUS | Status: DC | PRN
  Administered 2020-07-30: 1 mg via INTRAVENOUS
  Filled 2020-07-30: qty 1

## 2020-07-30 MED ORDER — HEPARIN (PORCINE) 25000 UT/250ML-% IV SOLN
1000.0000 [IU]/h | INTRAVENOUS | Status: DC
  Administered 2020-07-30: 1000 [IU]/h via INTRAVENOUS
  Filled 2020-07-30: qty 250

## 2020-07-30 MED ORDER — ORAL CARE MOUTH RINSE
15.0000 mL | OROMUCOSAL | Status: DC
  Administered 2020-07-30 – 2020-07-31 (×3): 15 mL via OROMUCOSAL

## 2020-07-30 MED ORDER — ROCURONIUM BROMIDE 10 MG/ML (PF) SYRINGE
PREFILLED_SYRINGE | INTRAVENOUS | Status: AC
  Administered 2020-07-30: 50 mg
  Filled 2020-07-30: qty 10

## 2020-07-30 MED ORDER — NOREPINEPHRINE 4 MG/250ML-% IV SOLN
2.0000 ug/min | INTRAVENOUS | Status: DC
  Administered 2020-07-30: 2 ug/min via INTRAVENOUS
  Filled 2020-07-30: qty 250

## 2020-07-30 MED ORDER — SODIUM CHLORIDE 0.9 % IV SOLN
250.0000 mL | INTRAVENOUS | Status: DC
  Administered 2020-07-30: 250 mL via INTRAVENOUS

## 2020-07-30 NOTE — Progress Notes (Signed)
Echo reviewed. 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  Patient with progressive shock with escalating doses of IV Levophed.  Co-ox pending.   Plan to add IV milrinone  Advanced heart failure team consulted.   Mitra Duling [place] MD, [facility]

## 2020-07-30 NOTE — Progress Notes (Signed)
ANTICOAGULATION CONSULT NOTE  Pharmacy Consult for heparin Indication: chest pain/ACS  No Known Allergies  Patient Measurements: Height: 6\' 6"  (198.1 cm) Weight: 79.5 kg (175 lb 4.3 oz) IBW/kg (Calculated) : 91.4 Heparin Dosing Weight: 79kg  Vital Signs: Temp: 97.6 F (36.4 C) (07/20 1922) Temp Source: Axillary (07/20 1922) BP: 70/34 (07/20 1630) Pulse Rate: 91 (07/20 2000)  Labs: Recent Labs    07/30/2020 0554 07/30/20 0312 07/30/20 1200 07/30/20 1248 07/30/20 1440 07/30/20 1557 07/30/20 1750 07/30/20 1903 07/30/20 2100  HGB 11.7* 11.9* 9.6*   < >  --  11.2* 11.1* 11.9*  --   HCT 38.5* 38.9* 32.8*   < >  --  33.0* 37.2* 35.0*  --   PLT 134* 157 117*  --   --   --  102*  --   --   APTT 28  --   --   --   --   --   --   --   --   LABPROT 14.3  --   --   --   --   --   --   --   --   INR 1.1  --   --   --   --   --   --   --   --   HEPARINUNFRC  --   --   --   --   --   --   --   --  0.36  CREATININE 7.07* 8.95* 8.04*  --  8.98*  --  8.55*  --   --   TROPONINIHS  --   --  598*  --  2,838*  --   --   --   --    < > = values in this interval not displayed.     Estimated Creatinine Clearance: 8 mL/min (A) (by C-G formula based on SCr of 8.55 mg/dL (H)).   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 Initial heparin level 0.36 on 1000 units/hr. No bleeding issues noted, hgb 11s.    Goal of Therapy:  Heparin level 0.3-0.7  units/ml Monitor platelets by anticoagulation protocol: Yes   Plan:  Heparin at 1000 units/h Check heparin level in am  Erin Hearing PharmD., BCPS Clinical Pharmacist 07/30/2020 10:27 PM

## 2020-07-30 NOTE — Progress Notes (Signed)
Started on HD despite hypotension (78/32) in attempt to treat hyperkalemia. Started with no UF. Gave 451mL NS bolus without improvement. Then gave 25g IV albumin, BP still hanging ~95 systolic. O2 sat low 90's despite turning nasal O2 ^ to 6L/min - putting on NRB mask now. CXR this morning with mild pulm edema. Said felt "phlegm in his throat" - coughed up sizeable chunk of blood. Rapid response called. Will rinse him back. VVS on-call provider paged (Dr. Donzetta Matters) paged to let him know that will likely need transition to intermediate v. ICU bed - he will get in touch with Dr. Gilles Chiquito PA to see him now.  Veneta Penton, PA-C [employer] Pager [phone]

## 2020-07-30 NOTE — Consult Note (Signed)
Cardiology Consultation:   Patient ID: Thomas Mcguire MRN: [id number]; DOB: [birth year]  Admit date: 08/04/2020 Date of Consult: 07/30/2020  PCP:  Leanna Battles, MD   [facility] Providers Cardiologist:  None        Patient Profile:   Thomas Mcguire is a 80 y.o. male with a hx of diabetes, hypertension, hyperlipidemia, PAD, ESRD on HD, CLL, chronic combined systolic and diastolic heart failure who is being seen 07/30/2020 for the evaluation of CHF/abnormal EKG at the request of Scot Dock.  History of Present Illness:   Thomas Mcguire is a 80 year old male with past medical history noted above.  He has been followed by vascular surgery as an outpatient for several years.  Does not appear he has ever been evaluated by cardiology in the past.  He presented for outpatient elective left femoral-popliteal bypass grafting on 7/19 in the setting of nonhealing foot wound.  Underwent left common femoral below the knee popliteal artery bypass with excision of 3 large inguinal lymph nodes.  Course appeared uncomplicated.  He was seen by nephrology for his HD needs.  Notes indicate he went into atrial fibrillation this morning and was placed on a Cardizem drip.  Chest x-ray showed cardiomegaly with mild bilateral interstitial edema. Also noted to be hyperkalemic with a potassium of 6.6.  He was ordered for insulin and glucose and scheduled for dialysis first thing.  While on HD he was noted to be hypotensive but continued on treatment and attempts to treat his hyperkalemia.  He was given normal saline bolus of 400 mL with no improvement.  He was noted to become hypoxic and required nonrebreather.  Notes indicate he coughed up a large blood clot.  Rapid response was called and he was brought back to his room for treatment.  Attempted BiPAP but he deteriorated requiring intubation.  Subsequently transferred to ICU.  Review of EKGs--> at 6:05 AM appears junctional rhythm, 72 bpm, incomplete left bundle,  prolonged QT.  Follow-up EKG 1121am sinus tach 118 bpm, T wave inversion in lead I, aVL, V5, V6.  EKG was reviewed with Dr. Burt Knack, felt not to meet STEMI criteria at present.  Cardiology asked to further evaluate.  Patient is intubated therefore history is obtained from the chart and family at bedside.  Presented to the ED on 7/17 with complaints of abdominal pain and dyspnea.  At that visit it is felt his abdominal pain was coming from worsening lymphadenopathy and splenomegaly.  He carries a diagnosis of CLL that was currently not being treated. Recommendations were to follow-up with Dr. Benay Spice.  Was seen in the oncology office the following day on 7/18 and it was felt that his pain was related to another etiology.  Discussed beginning treatment but decision was made to hold on systemic therapy at that time.  In talking with family he has not been very active over the last couple months due to combination of many factors.  States that he has never complained of chest pain with the exception of the day he was seen in the ER on 7/17.  Had associated dyspnea as well.  At this visit his high-sensitivity troponin was 81>> 75.  EKG at that visit shows sinus rhythm with biphasic T waves in lateral leads.  At time of assessment patient is currently on norepinephrine and IV heparin.  PCCM managing with need for CRRT.  Also noted to be febrile earlier this morning with increased WBC from 10.9>>17.5.  Does have nonhealing wound on left  lower extremity.  It was not felt that this was source of infection.  He has been started on broad-spectrum antibiotics.   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   PAD (peripheral artery disease) ([place])    Wears dentures     Past Surgical History:  Procedure Laterality Date   A/V FISTULAGRAM N/A 08/11/2018   Procedure: A/V FISTULAGRAM - Right Arm;  Surgeon: Angelia Mould, MD;  Location: [place] [facility];  Service: Cardiovascular;  Laterality: N/A;   A/V FISTULAGRAM N/A 09/07/2019   Procedure: A/V FISTULAGRAM - Right AVF;  Surgeon: Angelia Mould, MD;  Location: [place] [facility];  Service: Cardiovascular;  Laterality: N/A;   ABDOMINAL AORTOGRAM W/LOWER EXTREMITY Right 07/03/2018   Procedure: ABDOMINAL AORTOGRAM W/LOWER EXTREMITY;  Surgeon: Waynetta Sandy, MD;  Location: [place] [facility];  Service: Cardiovascular;  Laterality: Right;   ABDOMINAL AORTOGRAM W/LOWER EXTREMITY N/A 02/12/2020   Procedure: ABDOMINAL AORTOGRAM W/LOWER EXTREMITY;  Surgeon: Serafina Mitchell, MD;  Location: [place] [facility];  Service: Cardiovascular;  Laterality: N/A;   ABDOMINAL AORTOGRAM W/LOWER EXTREMITY Left 07/24/2020   Procedure: ABDOMINAL AORTOGRAM W/LOWER EXTREMITY;  Surgeon: [name], MD;  Location: [place] [facility];  Service: Cardiovascular;  Laterality: Left;   AMPUTATION  08/27/2011   Procedure: AMPUTATION DIGIT;  Surgeon: Newt Minion, MD;  Location: [place];  Service: Orthopedics;  Laterality: Left;  Left Foot 3rd toe Amputation MTP joint   AMPUTATION Right 07/11/2018   Procedure: AMPUTATION RIGHT GREAT TOE;  Surgeon: Angelia Mould, MD;  Location: [place];  Service: Vascular;  Laterality: Right;   AMPUTATION Right 02/15/2020   Procedure: TRANSMETATARSAL AMPUTATION;  Surgeon: Newt Minion, MD;  Location: [place];  Service: Orthopedics;  Laterality: Right;   amputation great toe  2009   left   AV FISTULA PLACEMENT Right 05/23/2018   Procedure: RADIAL- CEPHALIC ARTERIOVENOUS (AV) FISTULA CREATION RIGHT ARM AND LIGATION OF COMPETING  BRANCH.;  Surgeon: Angelia Mould, MD;  Location: [place];  Service: Vascular;  Laterality: Right;   [place] Right 10/01/2019   Procedure: FIRST STAGE [place];  Surgeon: Angelia Mould, MD;  Location: [place];  Service: Vascular;  Laterality: Right;   [place] Right 11/13/2019   Procedure: RIGHT SECOND STAGE [place];  Surgeon: Rosetta Posner, MD;  Location: [place];  Service: Vascular;  Laterality: Right;   COLONOSCOPY     cyst elbow  1999   right   FEMORAL-POPLITEAL BYPASS GRAFT Right 07/11/2018   Procedure: BYPASS GRAFT FEMORAL-POPLITEAL ARTERY;  Surgeon: Angelia Mould, MD;  Location: [place];  Service: Vascular;  Laterality: Right;   FEMORAL-POPLITEAL BYPASS GRAFT Left 07/21/2020   Procedure: LEFT FEMORAL-BELOW KNEE POPLITEAL ARTERY ARTERY BYPASS GRAFT  WITH EXCISION OF INGUINAL LYMPH NODES;  Surgeon: Angelia Mould, MD;  Location: [place];  Service: Vascular;  Laterality: Left;   FOOT SURGERY     both  feet for something that was torn   INTRAOPERATIVE ARTERIOGRAM Left 08/07/2020   Procedure: INTRA OPERATIVE ARTERIOGRAM;  Surgeon: Angelia Mould, MD;  Location: [place];  Service: Vascular;  Laterality: Left;   IR FLUORO GUIDE CV LINE RIGHT  11/08/2019   IR US GUIDE VASC ACCESS RIGHT  11/08/2019   LIGATION OF ARTERIOVENOUS  FISTULA Right 10/01/2019   Procedure: LIGATION OF RIGHT RADIOCEPHALIC  ARTERIOVENOUS  FISTULA;  Surgeon: Angelia Mould, MD;  Location: [place];  Service: Vascular;  Laterality: Right;   PERIPHERAL VASCULAR BALLOON ANGIOPLASTY Right 02/12/2020   Procedure: PERIPHERAL VASCULAR BALLOON ANGIOPLASTY;  Surgeon: Serafina Mitchell, MD;  Location: [place] [facility];  Service: Cardiovascular;  Laterality: Right;  peroneal   PERIPHERAL VASCULAR INTERVENTION Right 02/12/2020   Procedure: PERIPHERAL VASCULAR INTERVENTION;  Surgeon: Serafina Mitchell, MD;  Location: [place] [facility];   Service: Cardiovascular;  Laterality: Right;  fem/pop bypass graft   TOE AMPUTATION  2010   left 2nd toe   UPPER GI ENDOSCOPY       Home Medications:  Prior to Admission medications   Medication Sig Start Date End Date Taking? Authorizing Provider  aspirin EC 81 MG EC tablet Take 1 tablet (81 mg total) by mouth daily. Swallow whole. 02/21/20  Yes Shelly Coss, MD  B Complex-C-Folic Acid (NEPHRO-VITE RX PO) Take 1 tablet by mouth at bedtime. 11/27/19  Yes [provider]  folic acid (FOLVITE) 1 MG tablet TAKE 1 TABLET BY MOUTH EVERY DAY Patient taking differently: Take 1 mg by mouth daily. 03/14/20  Yes Ladell Pier, MD  insulin NPH Human (NOVOLIN N) 100 UNIT/ML injection Inject 0.5 mLs (50 Units total) into the skin daily before breakfast. Patient taking differently: Inject 50 Units into the skin daily as needed (high blood sugar). 11/14/19  Yes Vann, Jessica U, DO  midodrine (PROAMATINE) 10 MG tablet Take 10 mg by mouth 3 (three) times a week. 11/22/19  Yes [provider]  nitroGLYCERIN (NITRODUR - DOSED IN MG/24 HR) 0.2 mg/hr patch Place 1 patch (0.2 mg total) onto the skin daily. 07/11/20  Yes Newt Minion, MD  pravastatin (PRAVACHOL) 40 MG tablet Take 40 mg by mouth at bedtime.   Yes [provider]  sucroferric oxyhydroxide (VELPHORO) 500 MG chewable tablet Chew 1 tablet (500 mg total) by mouth 3 (three) times daily with meals. 02/20/20  Yes Shelly Coss, MD  traZODone (DESYREL) 50 MG tablet Take 50 mg by mouth at bedtime. 03/14/20  Yes [provider]  vitamin B-12 (CYANOCOBALAMIN) 1000 MCG tablet Take 1,000 mcg by mouth daily.   Yes [provider]  Accu-Chek FastClix Lancets MISC Use one lancet 5 times every day  DX:  250.00 12/30/11   [provider]  Blood Glucose Calibration (ACCU-CHEK AVIVA) SOLN U.A.D TO CALIBRATE METER 11/04/11   [provider]  Blood Glucose Monitoring Suppl (ACCU-CHEK AVIVA PLUS) w/Device KIT  Use meter to check blood sugar 10/20/11   [provider]  COVID-19 mRNA Vac-TriS, Pfizer, (PFIZER-BIONT COVID-19 VAC-TRIS) SUSP injection Inject into the muscle. 05/30/20   Carlyle Basques, MD  glucose blood (ACCU-CHEK AVIVA PLUS) test strip Check blood sugar 5 times daily (dx E11.49) 10/13/11   [provider]  Insulin Syringe-Needle U-100 31G X 5/16" 0.5 ML MISC Use one new syringe 6 times daily for insulin DX E11.49 02/08/17   [provider]  Lancets Misc. (ACCU-CHEK FASTCLIX LANCET) KIT u.a.d to test BS 5 times daily 11/04/11  [provider]  oxyCODONE (OXY IR/ROXICODONE) 5 MG immediate release tablet Take 1-2 tablets (5-10 mg total) by mouth every 4 (four) hours as needed for moderate pain (pain score 4-6). 02/20/20   Shelly Coss, MD  pentoxifylline (TRENTAL) 400 MG CR tablet Take 1 tablet (400 mg total) by mouth 3 (three) times daily with meals. 07/11/20   Newt Minion, MD    Inpatient Medications: Scheduled Meds:  (feeding supplement) PROSource Plus  30 mL Oral BID BM   aspirin  162 mg Per Tube Once   [START ON 20-Aug-2020] aspirin  81 mg Oral Daily   Chlorhexidine Gluconate Cloth  6 each Topical Daily   docusate  100 mg Per Tube BID   [START ON 3/70/4888] folic acid  1 mg Per Tube Daily   heparin  4,000 Units Intravenous Once    HYDROmorphone (DILAUDID) injection  0.5 mg Intravenous Once   insulin aspart  0-6 Units Subcutaneous TID WC   ketamine HCl       [START ON 08/01/2020] midodrine  10 mg Per Tube Once per day on Mon Wed Fri   multivitamin  1 tablet Per Tube QHS   [START ON Aug 20, 2020] pantoprazole sodium  40 mg Per Tube Daily   polyethylene glycol  17 g Per Tube Daily   pravastatin  40 mg Per Tube QHS   rocuronium bromide  50 mg Intravenous Once   traZODone  50 mg Per Tube QHS   Continuous Infusions:  sodium chloride     sodium chloride     heparin     HYDROmorphone     magnesium sulfate bolus IVPB     norepinephrine (LEVOPHED) Adult  infusion     piperacillin-tazobactam (ZOSYN)  IV 2.25 g (07/30/20 0744)   PRN Meds: sodium chloride, acetaminophen **OR** acetaminophen, bisacodyl, guaiFENesin-dextromethorphan, hydrALAZINE, HYDROmorphone, labetalol, magnesium sulfate bolus IVPB, metoprolol tartrate, ondansetron, oxyCODONE-acetaminophen, phenol, potassium chloride, senna-docusate  Allergies:   No Known Allergies  Social History:   Social History   Socioeconomic History   Marital status: Married    Spouse name: [name]   Number of children: Not on file   Years of education: Not on file   Highest education level: Not on file  Occupational History   Not on file  Tobacco Use   Smoking status: Former    Types: Cigarettes    Quit date: 03/11/1996    Years since quitting: 24.4   Smokeless tobacco: Never  Vaping Use   Vaping Use: Never used  Substance and Sexual Activity   Alcohol use: No   Drug use: No   Sexual activity: Not Currently  Other Topics Concern   Not on file  Social History Narrative   Not on file   Social Determinants of Health   Financial Resource Strain: Not on file  Food Insecurity: Not on file  Transportation Needs: Not on file  Physical Activity: Not on file  Stress: Not on file  Social Connections: Not on file  Intimate Partner Violence: Not on file    Family History:    Family History  Problem Relation Age of Onset   Colon cancer Neg Hx      ROS:  Please see the history of present illness.   All other ROS reviewed and negative.     Physical Exam/Data:   Vitals:   07/30/20 1125 07/30/20 1127 07/30/20 1142 07/30/20 1150  BP: (!) 119/54 (!) 111/46 (!) 104/45 (!) 101/39  Pulse: (!) 113 (!) 126 (!) 120 98  Resp: (!) 29 (!) 36 (!) 25 (!) 22  Temp:      TempSrc:      SpO2: (!) 82% 97% 96% 100%  Weight:      Height:        Intake/Output Summary (Last 24 hours) at 07/30/2020 1222 Last data filed at 07/30/2020 0300 Gross per 24 hour  Intake 530 ml  Output --  Net 530 ml    Last 3 Weights 07/30/2020 08/02/2020 07/28/2020  Weight (lbs) 175 lb 4.3 oz 175 lb 168 lb 10.4 oz  Weight (kg) 79.5 kg 79.379 kg 76.5 kg     Body mass index is 20.25 kg/m.  General:  Intubated, older AAM HEENT: normal Lymph: no adenopathy Neck: no JVD Endocrine:  No thryomegaly Vascular: No carotid bruits Cardiac:  normal S1, S2; Irreg Irreg; no murmur Lungs:  bilateral crackles Abd: soft, nontender, no hepatomegaly  Ext: no edema Musculoskeletal:  Prior left foot partial foot amp.  Skin: warm and dry  Neuro:  intubated, sedated  EKG:  The EKG was personally reviewed and demonstrates:  6:05 AM appears junctional rhythm, 72 bpm, incomplete left bundle, prolonged QT.  Follow-up EKG 1121am sinus tach 118 bpm, T wave inversion in lead I, aVL, V5, V6.   Telemetry:  Telemetry was personally reviewed and demonstrates:  Afib RVR---> ST  Relevant CV Studies:  Echo: 10/2019  IMPRESSIONS     1. Left ventricular ejection fraction, by estimation, is 35 to 40%. The  left ventricle has moderately decreased function. The left ventricle  demonstrates global hypokinesis. The left ventricular internal cavity size  was mildly dilated. Left ventricular  diastolic parameters are consistent with Grade II diastolic dysfunction  (pseudonormalization).   2. Right ventricular systolic function is normal. The right ventricular  size is normal. There is mildly elevated pulmonary artery systolic  pressure. The estimated right ventricular systolic pressure is 34.1 mmHg.   3. The mitral valve is abnormal. Restriction of the posterior leaflet  with moderate mitral valve regurgitation (PISA ERO 0.25 cm^2). No evidence  of mitral stenosis.   4. The aortic valve is tricuspid. Aortic valve regurgitation is mild. No  aortic stenosis is present.   5. Aortic dilatation noted. There is mild dilatation of the aortic root,  measuring 40 mm.   6. Left atrial size was severely dilated.   7. Right atrial size was  mildly dilated.   8. The inferior vena cava is dilated in size with <50% respiratory  variability, suggesting right atrial pressure of 15 mmHg.   FINDINGS   Left Ventricle: Left ventricular ejection fraction, by estimation, is 35  to 40%. The left ventricle has moderately decreased function. The left  ventricle demonstrates global hypokinesis. The left ventricular internal  cavity size was mildly dilated.  There is no left ventricular hypertrophy. Left ventricular diastolic  parameters are consistent with Grade II diastolic dysfunction  (pseudonormalization).   Right Ventricle: The right ventricular size is normal. No increase in  right ventricular wall thickness. Right ventricular systolic function is  normal. There is mildly elevated pulmonary artery systolic pressure. The  tricuspid regurgitant velocity is 2.69   m/s, and with an assumed right atrial pressure of 15 mmHg, the estimated  right ventricular systolic pressure is 93.7 mmHg.   Left Atrium: Left atrial size was severely dilated.   Right Atrium: Right atrial size was mildly dilated.   Pericardium: There is no evidence of pericardial effusion.   Mitral Valve: The mitral valve is abnormal. There  is mild calcification of  the mitral valve leaflet(s). Mild mitral annular calcification. Moderate  mitral valve regurgitation. No evidence of mitral valve stenosis.   Tricuspid Valve: The tricuspid valve is normal in structure. Tricuspid  valve regurgitation is mild.   Aortic Valve: The aortic valve is tricuspid. Aortic valve regurgitation is  mild. Aortic regurgitation PHT measures 335 msec. No aortic stenosis is  present.   Pulmonic Valve: The pulmonic valve was normal in structure. Pulmonic valve  regurgitation is trivial.   Aorta: Aortic dilatation noted. There is mild dilatation of the aortic  root, measuring 40 mm.   Venous: The inferior vena cava is dilated in size with less than 50%  respiratory variability,  suggesting right atrial pressure of 15 mmHg.   IAS/Shunts: No atrial level shunt detected by color flow Doppler.   Laboratory Data:  High Sensitivity Troponin:   Recent Labs  Lab 07/27/20 1938 07/27/20 2135  TROPONINIHS 81* 75*     Chemistry Recent Labs  Lab 07/27/20 1926 07/12/2020 0554 07/30/20 0312  NA 137 134* 133*  K 5.5* 5.1 6.6*  CL 92* 96* 91*  CO2 29 24 21*  GLUCOSE 129* 120* 228*  BUN 61* 54* 70*  CREATININE 7.84* 7.07* 8.95*  CALCIUM 9.3 9.3 9.2  GFRNONAA 7* 7* 6*  ANIONGAP 16* 14 21*    Recent Labs  Lab 07/27/20 1926 07/12/2020 0554  PROT 6.8 6.0*  ALBUMIN 4.0 3.2*  AST 23 17  ALT 11 12  ALKPHOS 48 51  BILITOT 0.9 1.1   Hematology Recent Labs  Lab 07/27/20 1926 07/12/2020 0554 07/30/20 0312  WBC 13.0* 10.9* 17.5*  RBC 4.33 4.04* 4.02*  HGB 12.8* 11.7* 11.9*  HCT 40.8 38.5* 38.9*  MCV 94.2 95.3 96.8  MCH 29.6 29.0 29.6  MCHC 31.4 30.4 30.6  RDW 16.2* 15.9* 16.0*  PLT 131* 134* 157   BNPNo results for input(s): BNP, PROBNP in the last 168 hours.  DDimer No results for input(s): DDIMER in the last 168 hours.   Radiology/Studies:  DG Chest 2 View  Result Date: 07/27/2020 CLINICAL DATA:  Dyspnea EXAM: CHEST - 2 VIEW COMPARISON:  02/14/2020 FINDINGS: Right-sided central venous catheter tip over the SVC. Cardiomegaly with vascular congestion and mild pulmonary edema. Small right pleural effusion. Aortic atherosclerosis. No pneumothorax IMPRESSION: Cardiomegaly with vascular congestion, mild pulmonary edema and small right pleural effusion Electronically Signed   By: Donavan Foil M.D.   On: 07/27/2020 20:49   CT ABDOMEN PELVIS W CONTRAST  Result Date: 07/27/2020 CLINICAL DATA:  Left upper quadrant pain EXAM: CT ABDOMEN AND PELVIS WITH CONTRAST TECHNIQUE: Multidetector CT imaging of the abdomen and pelvis was performed using the standard protocol following bolus administration of intravenous contrast. CONTRAST:  122m OMNIPAQUE IOHEXOL 300 MG/ML  SOLN  COMPARISON:  CT 02/27/2018, chest CT 05/14/2020 FINDINGS: Lower chest: Lung bases demonstrate mild septal thickening. Cardiomegaly. Small right-sided pleural effusion. Hepatobiliary: No focal hepatic abnormality. Small calcified gallstones. No biliary dilatation Pancreas: Unremarkable. No pancreatic ductal dilatation or surrounding inflammatory changes. Spleen: Enlarged, measuring 15 cm AP Adrenals/Urinary Tract: Right adrenal gland normal. 19 mm left adrenal nodule without significant change. Cortical atrophy of the kidneys. Heterogenous renal enhancement with multiple low-density lesions too small to further characterize. Few renal cysts. Delayed nephrogram and poor excretion of contrast consistent with decreased renal function. Mild hyperdensity within the bladder lumen which is incompletely distended Stomach/Bowel: Stomach nonenlarged. No dilated small bowel. No acute bowel wall thickening. Negative appendix Vascular/Lymphatic: Advanced aortic atherosclerosis  without aneurysm. Extensive abdominopelvic adenopathy. Multiple enlarged gastrohepatic, porta hepatis, peripancreatic and retroperitoneal nodes, for example 3 cm porta hepatis node series 2, image 35. Bulky Peri aortic adenopathy. Numerous enlarged mesenteric nodes. Increased external iliac adenopathy measuring 24 mm on the right and 22 mm on the left, previously 16 mm and 11 mm respectively. Bilateral inguinal adenopathy also increased compared to prior Reproductive: Prostate slightly enlarged Other: Negative for free air or pelvic effusion. Subcutaneous thickening of the anterior pelvic wall. Musculoskeletal: Stable small sclerotic lesion is in left iliac bone. Chronic bilateral pars defect at L5 with trace anterolisthesis. IMPRESSION: 1. Extensive bulky abdominal, retroperitoneal, pelvic and inguinal adenopathy, progressed as compared with 2020 CT and felt suspicious for lymphoproliferative disease/lymphoma/leukemia versus metastatic disease.  Splenomegaly. 2. Cardiomegaly with small right pleural effusion and mild diffuse septal thickening which could be secondary to edema 3. Delayed excretion of contrast from the kidneys suggesting decreased renal function. Correlate with appropriate laboratory value 4. Hyperdense material within the lumen of the bladder, doubt contrast given lack of excretion from kidneys. Could represent complex or hemorrhagic debris. 5. Gallstones Electronically Signed   By: Donavan Foil M.D.   On: 07/27/2020 20:45   DG CHEST PORT 1 VIEW  Result Date: 07/30/2020 CLINICAL DATA:  Fever.  Low blood pressure. EXAM: PORTABLE CHEST 1 VIEW COMPARISON:  07/27/2020. FINDINGS: Catheter stable position. Cardiomegaly with pulmonary venous congestion. Mild bilateral interstitial prominence. Mild interstitial edema cannot be excluded. Similar findings noted on prior exam. No pleural effusion or pneumothorax. IMPRESSION: 1.  Dual-lumen catheter stable position. 2. Cardiomegaly with pulmonary venous congestion mild bilateral interstitial prominence suggesting mild interstitial edema. Similar findings noted on prior exam. Electronically Signed   By: Marcello Moores  Register   On: 07/30/2020 07:07   DG Ang/Ext/Uni/Or Left  Result Date: 07/17/2020 CLINICAL DATA:  Intraoperative arteriogram EXAM: LEFT ANG/EXT/UNI/ OR CONTRAST:  See operative note for detail FLUOROSCOPY TIME:  See operative note for detail COMPARISON:  CT abdomen/pelvis 07/27/2020 FINDINGS: A single intraoperative saved images submitted for review. The image demonstrates a patent below the knee popliteal artery bypass graft with retrograde filling of the popliteal artery as well as antegrade filling of the P3 segment of the popliteal artery. The anterior tibial artery is widely patent. The tibioperoneal trunk is patent for approximately 2 cm before occluding. No definite visualization of the posterior tibial or peroneal arteries. IMPRESSION: Intraoperative arteriogram as above.  Electronically Signed   By: Jacqulynn Cadet M.D.   On: 07/17/2020 15:24     Assessment and Plan:   Thomas Mcguire is a 80 y.o. male with a hx of diabetes, hypertension, hyperlipidemia, PAD, ESRD on HD, CLL, chronic combined systolic and diastolic heart failure who is being seen 07/30/2020 for the evaluation of CHF/abnormal EKG at the request of Scot Dock.  Acute respiratory failure: acute deterioration requiring intubation. Noted to be febrile with rising WBC as well. Does have wound on left foot, but initially not felt to be source of infection.  -- on broad spectrum antibiotics per PCCM  New onset Afib RVR: developed this morning, started on Diltiazem drip. With hx of combined systolic/diastolic HF this was stopped. Has since converted back into SR--> ST.  -- remains on IV heparin -- will start IV amiodarone to hopefully suppress further episodes of afib in the acute setting -- would add BB when able  Acute on chronic combined systolic/diastolic HF: had an echo done back 10/21 showing EF of 35-40% with global hypokinesis. Etiology is unclear. He had  significant PVD therefore likely has some degree of CAD.  -- repeat echo -- volume management per nephrology, starting CRRT in the ICU -- unable to add GDMT at present with hypotension in need for pressor support  Abnormal EKG: EKG this morning notably abnormal with LVH but also in the setting of hyperkalemia.  -- IV heparin, ASA, statin -- trend troponin -- echo -- will follow clinic course, will need invasive work up once stable  PAD: now s/p left fem-pop bypass on 7/19 -- on ASA, statin  ESRD on HD: per nephrology, starting CRRT  Hyperkalemia: K+ 6.6 this morning, repeat 4.5 on recheck   For questions or updates, please contact [place] Please consult [email or web address] for contact info under    Signed, Reino Bellis, NP  07/30/2020 12:22 PM

## 2020-07-30 NOTE — Congregational Nurse Program (Signed)
Pt off the floor to dialysis. Unable to obtained the last VS per red MEWS protocol. Will recheck pt's vs when pt back to the unit.   Lavenia Atlas, RN

## 2020-07-30 NOTE — Progress Notes (Signed)
Attempted to collect sputum sample but unable to at this time.

## 2020-07-30 NOTE — Progress Notes (Signed)
Pharmacy Antibiotic Note  Thomas Mcguire is a 80 y.o. male admitted on 07/27/2020  s/p L fem-pop bypass 7/19. Now with low grade fever .  Pharmacy has been consulted for Zosyn dosing. PT with ESRD - on HD.  Plan: Zosyn 2.25gm IV q8h Will f/u fever curve and micro data  Height: 6\' 6"  (198.1 cm) Weight: 79.5 kg (175 lb 4.3 oz) IBW/kg (Calculated) : 91.4  Temp (24hrs), Avg:98.2 F (36.8 C), Min:97 F (36.1 C), Max:100.5 F (38.1 C)  Recent Labs  Lab 07/24/20 1044 07/27/20 1926 08/06/2020 0554 07/30/20 0312  WBC  --  13.0* 10.9* 17.5*  CREATININE 5.80* 7.84* 7.07* 8.95*    Estimated Creatinine Clearance: 7.6 mL/min (A) (by C-G formula based on SCr of 8.95 mg/dL (H)).    No Known Allergies  Antimicrobials this admission: 7/19 Ancef x2 7/20 Zosyn >>   Microbiology results: Pending  Thank you for allowing pharmacy to be a part of this patient's care.  Sherlon Handing, PharmD, BCPS Please see amion for complete clinical pharmacist phone list 07/30/2020 6:44 AM

## 2020-07-30 NOTE — Progress Notes (Signed)
Late entry for Dialysis Nurse Note  Pt scheduled for first shift dialysis tx dt hyperkalemia (6.6). Received report from [facility]; reported pt had new onset afibat approx 4am, Bps were soft, pt started developing fever 101.5 and had AMS at 0610. This RN reported new findings to nephrology Stephania Fragmin PA) and received orders to wait until further assessment could be done on pt.   At approx 0800, received orders to continue with plan for pt to receive tx on HD unit.   Received pt at approx 0850, SBP low 80's, fever progressed to 102.3, pt began vomiting dark green semi liquid emesis.   9509 Gave zofran IVP through NSL. Dialysis had not been started yet; reported progression of symptoms to [facility] and Dr. Marval Regal. Received order to start dialysis dt concern for K+ 6.6.   0930 initiation of tx (no fluid removal), BP 78/32 which required 200 ml NS; no improvement noted, second bolus 200 ml ns given and nephrology notified.   1000 Received orders for albumin 25 grams since BP continued to show no improvement, promptly given.   Respiratory status was deteriorating throughout this time. Pt had arrived on RA, but continued to have desaturations into upper 70's and 80's despite progression of increasing Fio2 on nasal cannula, then to non-rebreather. Pt had also started coughing up frank-red blood, 1-2 clots noted.   1005 Pt's blood returned to terminate tx. BP 87/40, oximetry high 80's on non-rebreather.   Stephania Fragmin called for Rapid Response Nurse.   1035 Rapid Response Nurse and this RN transported pt to 4E. Report given to Rupert Stacks.

## 2020-07-30 NOTE — Progress Notes (Signed)
PT Cancellation Note  Patient Details Name: Thomas Mcguire MRN: [id number] DOB: [birth year]   Cancelled Treatment:    Reason Eval/Treat Not Completed: Medical issues which prohibited therapy.  Pt intubated for resp distress.  Will hold today. 07/30/2020  Ginger Carne., PT Acute Rehabilitation Services [phone]  (pager) [phone]  (office)   Tessie Fass Mala Gibbard 07/30/2020, 12:53 PM

## 2020-07-30 NOTE — Progress Notes (Signed)
RT responded to rapid response after patient arrived back to 4E05 from dialysis. BIPAP was attempted & the patient failed due to anxiety & increased work of breathing with the BIPAP on. Dr. Lynetta Mare was made aware & instructed for the patient to be taken off BIPAP & placed on a NRB. RT placed the patient on NRB & the patient was transported to 2H02 with rapid response RN, 4E RN & 2 RT's.

## 2020-07-30 NOTE — Progress Notes (Signed)
PHARMACY NOTE:  ANTIMICROBIAL RENAL DOSAGE ADJUSTMENT  Current antimicrobial regimen includes a mismatch between antimicrobial dosage and estimated renal function.  As per policy approved by the Pharmacy & Therapeutics and Medical Executive Committees, the antimicrobial dosage will be adjusted accordingly.  Current antimicrobial dosage:  Zosyn 2.25g IV q8h  Indication: sepsis  Renal Function:  Estimated Creatinine Clearance: 7.6 mL/min (A) (by C-G formula based on SCr of 8.95 mg/dL (H)). []      On intermittent HD, scheduled: [x]      On CRRT    Antimicrobial dosage has been changed to:  3.375g IV q6h  Additional comments:   Thank you for allowing pharmacy to be a part of this patient's care.  Einar Grad, [facility] 07/30/2020 1:20 PM

## 2020-07-30 NOTE — Progress Notes (Signed)
[facility] Progress Note Patient Name: Thomas Mcguire DOB: 1940/11/12 MRN: [id number]   Date of Service  07/30/2020  HPI/Events of Note  ABG results noted, patient is on CRRT.  eICU Interventions  Calcium gluconate 1 gm iv bolus.        Kerry Kass Tywan Siever 07/30/2020, 8:21 PM

## 2020-07-30 NOTE — Progress Notes (Signed)
   07/30/20 1256  Clinical Encounter Type  Visited With Family  Visit Type Social support  Referral From Nurse  Consult/Referral To Chaplain   Chaplain was referred for family support. Chaplain encountered Pt's family in the waiting area. Chaplain engaged active listening and provided emotional and physical support. Chaplain remains available.   This note was prepared by Chaplain Resident, Dante Gang, MDiv. Chaplain remains available as needed through the on-call pager: [phone].

## 2020-07-30 NOTE — Procedures (Signed)
Intubation Procedure Note Thomas Mcguire [id number] 07/21/1940  Procedure: Intubation Indications: Respiratory insufficiency  Procedure Details Consent: Unable to obtain consent because of altered level of consciousness. Time Out: Verified patient identification, verified procedure, site/side was marked, verified correct patient position, special equipment/implants available, medications/allergies/relevent history reviewed, required imaging and test results available.  Performed  MAC and 3 Medications:  Fentanyl 100 mcg Etomidate 20 mg Versed 54m NMB rocuronium 100 mg   Evaluation Hemodynamic Status: BP stable throughout; O2 sats: stable throughout Patient's Current Condition: stable Complications: No apparent complications Patient did tolerate procedure well. Chest X-ray ordered to verify placement.  CXR: pending.   SRichardson LandryMinor ACNP LMaryanna ShapePCCM Pager [phone] 3 pm If no answer page [phone]/20/2022, 11:57 AM

## 2020-07-30 NOTE — Progress Notes (Signed)
VASCULAR SURGERY ASSESSMENT & PLAN:   POD 1 LEFT FEM BK POP BYPASS (VEIN): His bypass graft is patent with a brisk posterior tibial signal with the Doppler.  VASCULAR QUALITY INITIATIVE -he is on aspirin and a statin.  ATRIAL FIBRILLATION: He went into A. fib this morning with a heart rate up to 109.  I have ordered a Cardizem drip.  HYPERKALEMIA: His potassium this morning is 6.6.  I have ordered insulin and glucose and he is scheduled for dialysis first thing this morning.  I have spoken to the dialysis unit.  LYMPHADENOPATHY: I sent 3 large lymph nodes from the left groin.  The pathology is pending.  LOW GRADE FEVER: T max = 100.5.  The foot does not appear to be a source of sepsis.  His lungs are clear.  I will obtain a chest x-ray however.  Given the wound on his foot however and his history of diabetes I will start Zosyn (Day 1).  DVT PROPHYLAXIS: He is on subcu heparin.  Given his multiple medical issues including chronic lymphocytic leukemia, end-stage renal disease, diabetes, new onset atrial fibrillation, and hypertension I will ask for [facility] hospitalist to consult to assist with management of his multiple medical issues.  SUBJECTIVE:   No specific complaints this morning.  He answers questions appropriately.  Earlier he was a little bit confused.  PHYSICAL EXAM:   Vitals:   07/30/20 0005 07/30/20 0200 07/30/20 0410 07/30/20 0611  BP: (!) 105/45 (!) 99/44 (!) 116/47 (!) 89/45  Pulse: (!) 58 63 64 60  Resp: 17 (!) 22 20 (!) 32  Temp:   98.9 F (37.2 C) (!) 100.5 F (38.1 C)  TempSrc:   Oral Oral  SpO2: 98% 99% 100% 95%  Weight:   79.5 kg   Height:       Brisk posterior tibial signal with the Doppler. His incisions all look fine. 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  There is no erythema or drainage to suggest a source for infection.  LABS:   Lab Results  Component Value Date   WBC 17.5 (H) 07/30/2020   HGB 11.9 (L) 07/30/2020   HCT 38.9  (L) 07/30/2020   MCV 96.8 07/30/2020   PLT 157 07/30/2020   Lab Results  Component Value Date   CREATININE 8.95 (H) 07/30/2020   Lab Results  Component Value Date   INR 1.1 07/24/2020   CBG (last 3)  Recent Labs    07/26/2020 1558 07/15/2020 2030 07/30/20 0609  GLUCAP 193* 231* 196*    PROBLEM LIST:    Active Problems:   Peripheral arterial occlusive disease (HCC)   CURRENT MEDS:    (feeding supplement) PROSource Plus  30 mL Oral BID BM   aspirin EC  81 mg Oral Daily   Chlorhexidine Gluconate Cloth  6 each Topical Daily   dextrose       docusate sodium  100 mg Oral Daily   folic acid  1 mg Oral Daily   heparin  5,000 Units Subcutaneous Q8H   insulin aspart  0-6 Units Subcutaneous TID WC   midodrine  10 mg Oral Once per day on Mon Wed Fri   multivitamin  1 tablet Oral QHS   pantoprazole  40 mg Oral Daily   pentoxifylline  400 mg Oral TID WC   pravastatin  40 mg Oral QHS   sucroferric oxyhydroxide  500 mg Oral TID WC   traZODone  50 mg Oral  QHS    Deitra Mayo Office: [phone] 07/30/2020

## 2020-07-30 NOTE — Consult Note (Addendum)
Advanced Heart Failure Team Consult Note   Primary Physician: Leanna Battles, MD PCP-Cardiologist:  None OncologyL Dr Benay Spice   Reason for Consultation: Shock   HPI:    Thomas Mcguire is seen today for evaluation of shock at the request of Dr [place].   Thomas Mcguire is a 80 year old with history of DM, HTN, HLD, CLL, ESRD on dialysis, severe PAD, chronic systolic heart failure,  non healing left foot ulcer. Has not seen cardiology in many year. Had myoview in 2003 and this was negative for ischemia.   On 07/24/20 had aortogram with diffusely diseased aortoiliac  with plan for left fem pop bypass.   Presented to the ED on 07/27/20 with abdominal pain and shortness of breath. CT abdomen/pelvis showed extensive bulky abdominal, retroperitoneal, pelvic and inguinal adenopathy, progressed as compared to 2020 CT. He discharged later that night with follow up in Dr Bernette Redbird office the next day.   Saw Dr Benay Spice 07/28/20. Scans reviewed. The palpable peripheral lymphadenopathy is unchanged and he is stable from a hematologic standpoint.  He discussed beginning treatment with a Bruton's tyrosine kinase inhibitor. Recommended holding systemic therapy for now with plan to follow up.   Admitted 08/04/2020 for left fem pop by pass graft and excision of 3 inguinal nodes.  Did well after surgery . Nephrology consulted with plan for dialysis today. Today he had  K 6.6  and new onset A fib + hypotension. Today he went to dialysis and was hypotension, in A fib RVR and had acute hypoxemic respiratory failure. CCM consulted with urgent intubation. Started on amio drip and placed on norepi. Had ECHO today and this showed severe biventricular HF with LVEF < 20 and RV severely reduced, RA/LA moderately dilated, mild-mod Thomas, mod TR, and RA 3 mm hg.   Blood Cx obtained.  Lactic Acid 8.1>7.9  HS Trop 598> 2838   QX calculate- Fick CO 3.3 CI 1.8   Review of Systems: [y] = yes, '[ ]'  = no Patient is  encephalopathic and or intubated. Therefore history has been obtained from chart review.    General: Weight gain '[ ]' ; Weight loss '[ ]' ; Anorexia '[ ]' ; Fatigue '[ ]' ; Fever '[ ]' ; Chills '[ ]' ; Weakness '[ ]'   Cardiac: Chest pain/pressure '[ ]' ; Resting SOB '[ ]' ; Exertional SOB '[ ]' ; Orthopnea '[ ]' ; Pedal Edema '[ ]' ; Palpitations '[ ]' ; Syncope '[ ]' ; Presyncope '[ ]' ; Paroxysmal nocturnal dyspnea'[ ]'   Pulmonary: Cough '[ ]' ; Wheezing'[ ]' ; Hemoptysis'[ ]' ; Sputum '[ ]' ; Snoring '[ ]'   GI: Vomiting'[ ]' ; Dysphagia'[ ]' ; Melena'[ ]' ; Hematochezia '[ ]' ; Heartburn'[ ]' ; Abdominal pain '[ ]' ; Constipation '[ ]' ; Diarrhea '[ ]' ; BRBPR '[ ]'   GU: Hematuria'[ ]' ; Dysuria '[ ]' ; Nocturia'[ ]'   Vascular: Pain in legs with walking '[ ]' ; Pain in feet with lying flat '[ ]' ; Non-healing sores '[ ]' ; Stroke '[ ]' ; TIA '[ ]' ; Slurred speech '[ ]' ;  Neuro: Headaches'[ ]' ; Vertigo'[ ]' ; Seizures'[ ]' ; Paresthesias'[ ]' ;Blurred vision '[ ]' ; Diplopia '[ ]' ; Vision changes '[ ]'   Ortho/Skin: Arthritis '[ ]' ; Joint pain '[ ]' ; Muscle pain '[ ]' ; Joint swelling '[ ]' ; Back Pain '[ ]' ; Rash '[ ]'   Psych: Depression'[ ]' ; Anxiety'[ ]'   Heme: Bleeding problems '[ ]' ; Clotting disorders '[ ]' ; Anemia '[ ]'   Endocrine: Diabetes '[ ]' ; Thyroid dysfunction'[ ]'   Home Medications Prior to Admission medications   Medication Sig Start Date End Date Taking? Authorizing Provider  aspirin EC 81 MG EC tablet Take  1 tablet (81 mg total) by mouth daily. Swallow whole. 02/21/20  Yes Shelly Coss, MD  B Complex-C-Folic Acid (NEPHRO-VITE RX PO) Take 1 tablet by mouth at bedtime. 11/27/19  Yes [provider]  folic acid (FOLVITE) 1 MG tablet TAKE 1 TABLET BY MOUTH EVERY DAY Patient taking differently: Take 1 mg by mouth daily. 03/14/20  Yes Ladell Pier, MD  insulin NPH Human (NOVOLIN N) 100 UNIT/ML injection Inject 0.5 mLs (50 Units total) into the skin daily before breakfast. Patient taking differently: Inject 50 Units into the skin daily as needed (high blood sugar). 11/14/19  Yes Vann, Jessica U, DO  midodrine (PROAMATINE)  10 MG tablet Take 10 mg by mouth 3 (three) times a week. 11/22/19  Yes [provider]  nitroGLYCERIN (NITRODUR - DOSED IN MG/24 HR) 0.2 mg/hr patch Place 1 patch (0.2 mg total) onto the skin daily. 07/11/20  Yes Newt Minion, MD  pravastatin (PRAVACHOL) 40 MG tablet Take 40 mg by mouth at bedtime.   Yes [provider]  sucroferric oxyhydroxide (VELPHORO) 500 MG chewable tablet Chew 1 tablet (500 mg total) by mouth 3 (three) times daily with meals. 02/20/20  Yes Shelly Coss, MD  traZODone (DESYREL) 50 MG tablet Take 50 mg by mouth at bedtime. 03/14/20  Yes [provider]  vitamin B-12 (CYANOCOBALAMIN) 1000 MCG tablet Take 1,000 mcg by mouth daily.   Yes [provider]  Accu-Chek FastClix Lancets MISC Use one lancet 5 times every day  DX:  250.00 12/30/11   [provider]  Blood Glucose Calibration (ACCU-CHEK AVIVA) SOLN U.A.D TO CALIBRATE METER 11/04/11   [provider]  Blood Glucose Monitoring Suppl (ACCU-CHEK AVIVA PLUS) w/Device KIT Use meter to check blood sugar 10/20/11   [provider]  COVID-19 mRNA Vac-TriS, Pfizer, (PFIZER-BIONT COVID-19 VAC-TRIS) SUSP injection Inject into the muscle. 05/30/20   Carlyle Basques, MD  glucose blood (ACCU-CHEK AVIVA PLUS) test strip Check blood sugar 5 times daily (dx E11.49) 10/13/11   [provider]  Insulin Syringe-Needle U-100 31G X 5/16" 0.5 ML MISC Use one new syringe 6 times daily for insulin DX E11.49 02/08/17   [provider]  Lancets Misc. (ACCU-CHEK FASTCLIX LANCET) KIT u.a.d to test BS 5 times daily 11/04/11   [provider]  oxyCODONE (OXY IR/ROXICODONE) 5 MG immediate release tablet Take 1-2 tablets (5-10 mg total) by mouth every 4 (four) hours as needed for moderate pain (pain score 4-6). 02/20/20   Shelly Coss, MD  pentoxifylline (TRENTAL) 400 MG CR tablet Take 1 tablet (400 mg total) by mouth 3 (three) times daily with meals. 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   PAD (peripheral artery disease) ([place])    Wears dentures     Past Surgical History: Past Surgical History:  Procedure Laterality Date   A/V FISTULAGRAM N/A 08/11/2018   Procedure: A/V FISTULAGRAM - Right Arm;  Surgeon: Angelia Mould, MD;  Location: [facility];  Service: Cardiovascular;  Laterality: N/A;   A/V FISTULAGRAM N/A 09/07/2019   Procedure: A/V FISTULAGRAM - Right AVF;  Surgeon: Angelia Mould, MD;  Location: [place] [facility];  Service: Cardiovascular;  Laterality: N/A;   ABDOMINAL AORTOGRAM W/LOWER EXTREMITY Right 07/03/2018   Procedure: ABDOMINAL AORTOGRAM W/LOWER EXTREMITY;  Surgeon: Waynetta Sandy, MD;  Location: [facility];  Service: Cardiovascular;  Laterality: Right;   ABDOMINAL AORTOGRAM W/LOWER EXTREMITY N/A 02/12/2020   Procedure: ABDOMINAL AORTOGRAM W/LOWER EXTREMITY;  Surgeon: Serafina Mitchell, MD;  Location: [facility];  Service: Cardiovascular;  Laterality: N/A;   ABDOMINAL AORTOGRAM W/LOWER EXTREMITY Left 07/24/2020   Procedure: ABDOMINAL AORTOGRAM W/LOWER EXTREMITY;  Surgeon: [name], MD;  Location: [facility] [facility];  Service: Cardiovascular;  Laterality: Left;   AMPUTATION  08/27/2011   Procedure: AMPUTATION DIGIT;  Surgeon: Newt Minion, MD;  Location: [place];  Service: Orthopedics;  Laterality: Left;  Left Foot 3rd toe Amputation MTP joint   AMPUTATION Right 07/11/2018   Procedure: AMPUTATION RIGHT GREAT TOE;  Surgeon: Angelia Mould, MD;  Location: [place];  Service: Vascular;  Laterality: Right;   AMPUTATION Right 02/15/2020   Procedure: TRANSMETATARSAL AMPUTATION;  Surgeon: Newt Minion, MD;  Location: [place];  Service: Orthopedics;  Laterality: Right;   amputation great toe  2009   left   AV FISTULA PLACEMENT Right 05/23/2018   Procedure: RADIAL- CEPHALIC ARTERIOVENOUS (AV) FISTULA CREATION RIGHT ARM AND LIGATION OF COMPETING BRANCH.;  Surgeon: Angelia Mould, MD;  Location: [place];  Service: Vascular;  Laterality: Right;   [place] Right 10/01/2019   Procedure: FIRST STAGE [place];  Surgeon: Angelia Mould, MD;  Location: [place];  Service: Vascular;  Laterality: Right;   [place] Right 11/13/2019   Procedure: RIGHT SECOND STAGE [place];  Surgeon: Rosetta Posner, MD;  Location: [place];  Service: Vascular;  Laterality: Right;   COLONOSCOPY     cyst elbow  1999   right   FEMORAL-POPLITEAL BYPASS GRAFT Right 07/11/2018   Procedure: BYPASS GRAFT FEMORAL-POPLITEAL ARTERY;  Surgeon: Angelia Mould, MD;  Location: [place];  Service: Vascular;  Laterality: Right;   FEMORAL-POPLITEAL BYPASS GRAFT Left 07/21/2020   Procedure: LEFT FEMORAL-BELOW KNEE POPLITEAL ARTERY ARTERY BYPASS GRAFT  WITH EXCISION OF INGUINAL LYMPH NODES;  Surgeon: Angelia Mould, MD;  Location: [place];  Service: Vascular;  Laterality: Left;   FOOT SURGERY     both feet for something that was torn   INTRAOPERATIVE ARTERIOGRAM Left 07/26/2020   Procedure: INTRA OPERATIVE ARTERIOGRAM;  Surgeon: Angelia Mould, MD;  Location: [place];  Service: Vascular;  Laterality: Left;   IR FLUORO GUIDE CV LINE RIGHT  11/08/2019   IR US GUIDE VASC ACCESS RIGHT  11/08/2019   LIGATION OF ARTERIOVENOUS  FISTULA Right 10/01/2019   Procedure: LIGATION OF RIGHT RADIOCEPHALIC  ARTERIOVENOUS  FISTULA;  Surgeon: Angelia Mould, MD;  Location: [facility];  Service:  Vascular;  Laterality: Right;   PERIPHERAL VASCULAR BALLOON ANGIOPLASTY Right 02/12/2020   Procedure: PERIPHERAL VASCULAR BALLOON ANGIOPLASTY;  Surgeon: Serafina Mitchell, MD;  Location: [place] [facility];  Service: Cardiovascular;  Laterality: Right;  peroneal   PERIPHERAL VASCULAR INTERVENTION Right 02/12/2020   Procedure: PERIPHERAL VASCULAR INTERVENTION;  Surgeon: Serafina Mitchell, MD;  Location: [facility] [facility];  Service: Cardiovascular;  Laterality: Right;  fem/pop bypass graft   TOE AMPUTATION  2010   left 2nd toe   UPPER GI ENDOSCOPY      Family History: Family History  Problem Relation Age of Onset   Colon cancer Neg Hx     Social History: Social History   Socioeconomic History   Marital status: Married    Spouse name: [name]   Number of children: Not on file   Years of education: Not on file   Highest education level: Not on file  Occupational History   Not on file  Tobacco Use   Smoking status: Former    Types: Cigarettes    Quit date: 03/11/1996    Years since quitting: 24.4   Smokeless tobacco: Never  Vaping Use   Vaping Use: Never used  Substance and Sexual Activity   Alcohol use: No   Drug use: No   Sexual activity: Not Currently  Other Topics Concern   Not on file  Social History Narrative   Not on file   Social Determinants of Health   Financial Resource Strain: Not on file  Food Insecurity: Not on file  Transportation Needs: Not on file  Physical Activity: Not on file  Stress: Not on file  Social Connections: Not on file    Allergies:  No Known Allergies  Objective:    Vital Signs:   Temp:  [97.9 F (36.6 C)-102.3 F (39.1 C)] 99 F (37.2 C) (07/20 1548) Pulse Rate:  [58-126] 80 (07/20 1922) Resp:  [10-36] 32 (07/20 1922) BP: (70-119)/(18-82) 70/34 (07/20 1630) SpO2:  [82 %-100 %] 100 % (07/20 1922) Arterial Line BP: (88-108)/(28-32) 108/28 (07/20 1900) FiO2 (%):  [80 %-100 %] 80 % (07/20 1922) Weight:  [79.5 kg] 79.5 kg (07/20  0410) Last BM Date: 07/28/20  Weight change: Filed Weights   07/27/2020 0543 07/30/20 0410  Weight: 79.4 kg 79.5 kg    Intake/Output:   Intake/Output Summary (Last 24 hours) at 07/30/2020 1928 Last data filed at 07/30/2020 1900 Gross per 24 hour  Intake 1553.99 ml  Output 534 ml  Net 1019.99 ml      Physical Exam    General:  Sedated/Intubated.  HEENT: ETT  Neck: supple. JVP difficult to assess. Carotids 2+ bilat; no bruits. No lymphadenopathy or thyromegaly appreciated. Cor: PMI nondisplaced. Tachy regular rate & rhythm. No rubs, gallops or murmurs. Lungs: clear Abdomen: soft, nontender, nondistended. No hepatosplenomegaly. No bruits or masses. Good bowel sounds. Extremities: no cyanosis, clubbing, rash, edema. L foot with eschar  Neuro: Intubated/sedated    Telemetry  Sinus Tach  EKG   A fib RVR   Labs   Basic Metabolic Panel: Recent Labs  Lab 08/07/2020 0554 07/30/20 0312 07/30/20 1200 07/30/20 1248 07/30/20 1440 07/30/20 1557 07/30/20 1750 07/30/20 1903  NA 134* 133* 137 134* 136 132* 136 133*  K 5.1 6.6* 4.7 4.5 4.9 4.7 4.6 4.3  CL 96* 91* 102  --  98  --  98  --   CO2 24 21* 16*  --  19*  --  17*  --   GLUCOSE 120* 228* 94  --  91  --  102*  --   BUN 54* 70* 65*  --  71*  --  71*  --   CREATININE 7.07* 8.95* 8.04*  --  8.98*  --  8.55*  --   CALCIUM 9.3 9.2 7.7*  --  8.4*  --  8.2*  --   PHOS  --   --   --   --  6.8*  --   --   --     Liver Function Tests: Recent Labs  Lab 07/27/20 1926 07/28/2020 0554 07/30/20 1440 07/30/20 1750  AST 23 17  --  80*  ALT 11 12  --  19  ALKPHOS 48 51  --  42  BILITOT 0.9 1.1  --  1.5*  PROT 6.8 6.0*  --  5.3*  ALBUMIN 4.0 3.2* 2.8* 2.8*   Recent Labs  Lab 07/27/20 1926  LIPASE 37   No results for input(s): AMMONIA in the last 168 hours.  CBC: Recent Labs  Lab 07/27/20 1926 07/28/2020 0554 07/30/20 0312 07/30/20 1200 07/30/20 1248 07/30/20 1557 07/30/20 1750 07/30/20 1903  WBC 13.0* 10.9* 17.5*  49.2*  --   --  54.0*  --   HGB 12.8* 11.7* 11.9* 9.6* 11.2* 11.2* 11.1* 11.9*  HCT 40.8 38.5* 38.9* 32.8* 33.0* 33.0* 37.2* 35.0*  MCV 94.2 95.3 96.8 98.2  --   --  97.6  --   PLT 131* 134* 157 117*  --   --  102*  --     Cardiac Enzymes: No results for input(s): CKTOTAL, CKMB, CKMBINDEX, TROPONINI in the last 168 hours.  BNP: BNP (last 3 results) Recent Labs    10/21/19 0023  BNP 1,915.6*    ProBNP (last 3 results) No results for input(s): PROBNP in the last 8760 hours.   CBG: Recent Labs  Lab 07/13/2020 1558 08/04/2020 2030 07/30/20 0609 07/30/20 1223 07/30/20 1704  GLUCAP 193* 231* 196* 77 92    Coagulation Studies: Recent Labs    07/20/2020 0554  LABPROT 14.3  INR 1.1     Imaging   DG CHEST PORT 1 VIEW  Result Date: 07/30/2020 CLINICAL DATA:  New left internal jugular catheter.  Desaturations. EXAM: PORTABLE CHEST 1 VIEW COMPARISON:  Radiograph earlier today. FINDINGS: Left internal jugular central catheter tip overlies the mid SVC. No pneumothorax. There is increasing right basilar airspace disease with air bronchograms and obscuration of the hemidiaphragm. The endotracheal tube tip is 6.3 cm from the carina. Enteric tube and right dialysis catheter remain in place. Stable heart size and mediastinal contours with aortic atherosclerosis. Left costophrenic angle not entirely included in the field of view, no large left pleural effusion. IMPRESSION: 1. Left internal jugular central catheter with tip overlying the mid SVC. No pneumothorax. 2. Increasing right basilar airspace disease with air bronchograms, suspicious for pneumonia and associated pleural effusions. Electronically Signed   By: Keith Rake M.D.   On: 07/30/2020 15:37   DG CHEST PORT 1 VIEW  Result Date: 07/30/2020 CLINICAL DATA:  Endotracheal tube placement. EXAM: PORTABLE CHEST 1 VIEW COMPARISON:  Same day. FINDINGS: Stable cardiomediastinal silhouette. Right internal jugular dialysis catheter is  unchanged in position. Endotracheal and nasogastric tubes are in grossly good position. No pneumothorax is noted. Left lung is clear. Right lower lobe opacity is noted concerning for atelectasis or possibly infiltrate. Bony thorax is unremarkable. IMPRESSION: Endotracheal and nasogastric tubes are in grossly good position. Right lower lobe opacity is noted concerning for atelectasis or possibly infiltrate. Electronically Signed   By: Marijo Conception M.D.   On: 07/30/2020 13:18   DG CHEST PORT 1 VIEW  Result Date: 07/30/2020 CLINICAL DATA:  Fever.  Low blood pressure. EXAM: PORTABLE CHEST 1 VIEW COMPARISON:  07/27/2020. FINDINGS: Catheter stable position. Cardiomegaly with pulmonary venous congestion. Mild bilateral interstitial prominence. Mild interstitial edema cannot be excluded. Similar findings noted on prior exam. No pleural effusion or  pneumothorax. IMPRESSION: 1.  Dual-lumen catheter stable position. 2. Cardiomegaly with pulmonary venous congestion mild bilateral interstitial prominence suggesting mild interstitial edema. Similar findings noted on prior exam. Electronically Signed   By: Marcello Moores  Register   On: 07/30/2020 07:07   ECHOCARDIOGRAM COMPLETE  Result Date: 07/30/2020    ECHOCARDIOGRAM REPORT   Patient Name:   EDREI NORGAARD Date of Exam: 07/30/2020 Medical Rec #:  [id number]         Height:       78.0 in Accession #:    [id number]        Weight:       175.3 lb Date of Birth:  06-Mar-1940         BSA:          2.134 m Patient Age:    56 years          BP:           85/46 mmHg Patient Gender: M                 HR:           109 bpm. Exam Location:  Inpatient Procedure: 2D Echo, Color Doppler and Cardiac Doppler STAT ECHO Indications:    Acute MI  History:        Patient has prior history of Echocardiogram examinations, most                 recent 11/08/2019. CHF, PAD; Risk Factors:Hypertension,                 Diabetes, Dyslipidemia and ESRD.  Sonographer:    Raquel Sarna Senior RDCS Referring Phys:  [id number] Kipp Brood  Sonographer Comments: Scanned supine on artificial respirator. No parasternal window. IMPRESSIONS  1. Left ventricular ejection fraction, by estimation, is <20%. The left ventricle has severely decreased function. The left ventricle demonstrates global hypokinesis. Left ventricular diastolic parameters are consistent with Grade II diastolic dysfunction (pseudonormalization).  2. Right ventricular systolic function is severely reduced. The right ventricular size is mildly enlarged. There is moderately elevated pulmonary artery systolic pressure. The estimated right ventricular systolic pressure is 71.1 mmHg.  3. Left atrial size was moderately dilated.  4. Right atrial size was moderately dilated.  5. The mitral valve is normal in structure. Mild to moderate mitral valve regurgitation. No evidence of mitral stenosis.  6. Tricuspid valve regurgitation is moderate.  7. The aortic valve is normal in structure. Aortic valve regurgitation is mild. No aortic stenosis is present.  8. The inferior vena cava is normal in size with greater than 50% respiratory variability, suggesting right atrial pressure of 3 mmHg. Comparison(s): Prior images unable to be directly viewed, comparison made by report only. The left ventricular function is worsened. FINDINGS  Left Ventricle: Left ventricular ejection fraction, by estimation, is <20%. The left ventricle has severely decreased function. The left ventricle demonstrates global hypokinesis. The left ventricular internal cavity size was normal in size. There is no  left ventricular hypertrophy. Left ventricular diastolic parameters are consistent with Grade II diastolic dysfunction (pseudonormalization). Right Ventricle: The right ventricular size is mildly enlarged. No increase in right ventricular wall thickness. Right ventricular systolic function is severely reduced. There is moderately elevated pulmonary artery systolic pressure. The tricuspid regurgitant  velocity is 2.81 m/s, and with an assumed right atrial pressure of 15 mmHg, the estimated right ventricular systolic pressure is 65.7 mmHg. Left Atrium: Left atrial size was moderately dilated. Right Atrium: Right atrial size was  moderately dilated. Pericardium: There is no evidence of pericardial effusion. Mitral Valve: The mitral valve is normal in structure. There is mild thickening of the mitral valve leaflet(s). There is mild calcification of the mitral valve leaflet(s). Mild to moderate mitral valve regurgitation. No evidence of mitral valve stenosis. Tricuspid Valve: The tricuspid valve is normal in structure. Tricuspid valve regurgitation is moderate . No evidence of tricuspid stenosis. Aortic Valve: The aortic valve is normal in structure. Aortic valve regurgitation is mild. No aortic stenosis is present. Pulmonic Valve: The pulmonic valve was normal in structure. Pulmonic valve regurgitation is not visualized. No evidence of pulmonic stenosis. Aorta: The aortic root is normal in size and structure. Venous: The inferior vena cava is normal in size with greater than 50% respiratory variability, suggesting right atrial pressure of 3 mmHg. IAS/Shunts: There is redundancy of the interatrial septum. No atrial level shunt detected by color flow Doppler.  LEFT VENTRICLE PLAX 2D LVOT diam:     2.20 cm LV SV:         42 LV SV Index:   20 LVOT Area:     3.80 cm  LV Volumes (MOD) LV vol d, MOD A2C: 178.0 ml LV vol d, MOD A4C: 200.0 ml LV vol s, MOD A2C: 148.0 ml LV vol s, MOD A4C: 170.0 ml LV SV MOD A2C:     30.0 ml LV SV MOD A4C:     200.0 ml LV SV MOD BP:      30.7 ml RIGHT VENTRICLE RV S prime:     4.13 cm/s TAPSE (M-mode): 0.9 cm LEFT ATRIUM              Index       RIGHT ATRIUM           Index LA Vol (A2C):   111.0 ml 52.01 ml/m RA Area:     14.50 cm LA Vol (A4C):   86.1 ml  40.34 ml/m RA Volume:   36.90 ml  17.29 ml/m LA Biplane Vol: 98.1 ml  45.96 ml/m  AORTIC VALVE LVOT Vmax:   59.90 cm/s LVOT Vmean:   44.300 cm/s LVOT VTI:    0.110 m MITRAL VALVE               TRICUSPID VALVE MV Area (PHT): 4.65 cm    TR Peak grad:   31.6 mmHg MV Decel Time: 163 msec    TR Vmax:        281.00 cm/s MV E velocity: 97.50 cm/s                            SHUNTS                            Systemic VTI:  0.11 m                            Systemic Diam: 2.20 cm Candee Furbish MD Electronically signed by Candee Furbish MD Signature Date/Time: 07/30/2020/1:36:19 PM    Final      Medications:     Current Medications:  (feeding supplement) PROSource Plus  30 mL Oral BID BM   [START ON August 23, 2020] aspirin  81 mg Per Tube Daily   chlorhexidine gluconate (MEDLINE KIT)  15 mL Mouth Rinse BID   Chlorhexidine Gluconate Cloth  6 each Topical Daily   [START ON  08/29/2020] clopidogrel  75 mg Per Tube Daily   docusate  100 mg Per Tube BID   feeding supplement (VITAL HIGH PROTEIN)  1,000 mL Per Tube Q24H   [START ON 04/14/7094] folic acid  1 mg Per Tube Daily   insulin aspart  0-6 Units Subcutaneous TID WC   mouth rinse  15 mL Mouth Rinse 10 times per day   [START ON 08/01/2020] midodrine  10 mg Per Tube Once per day on Mon Wed Fri   multivitamin  1 tablet Per Tube QHS   [START ON August 29, 2020] pantoprazole sodium  40 mg Per Tube Daily   polyethylene glycol  17 g Per Tube Daily   pravastatin  40 mg Per Tube QHS   rocuronium bromide  50 mg Intravenous Once   sodium chloride flush  10-40 mL Intracatheter Q12H   traZODone  50 mg Per Tube QHS    Infusions:  sodium chloride     sodium chloride     sodium chloride 10 mL/hr at 07/30/20 1900   amiodarone 60 mg/hr (07/30/20 1900)   Followed by   amiodarone     dextrose 50 mL/hr at 07/30/20 1900   heparin 1,000 Units/hr (07/30/20 1900)   HYDROmorphone 1 mg/hr (07/30/20 1900)   magnesium sulfate bolus IVPB     milrinone 0.25 mcg/kg/min (07/30/20 1900)   norepinephrine (LEVOPHED) Adult infusion 40 mcg/min (07/30/20 1900)   piperacillin-tazobactam Stopped (07/30/20 1623)   prismasol BGK  0/2.5 300 mL/hr at 07/30/20 1509   prismasol BGK 0/2.5 300 mL/hr at 07/30/20 1511   prismasol BGK 2/2.5 dialysis solution 1,500 mL/hr at 07/30/20 1512   vasopressin 0.03 Units/min (07/30/20 1900)        Assessment/Plan   Shock ---> Septic/Cardiogenic with severe biventricular heart failure.  -non healing LLE. ? Osteo -Lactic Acid 8.1 >7.9 - WBC 49 - CO-OX 53% .on norepi.  - Remains hypotensive. Increasing norepi and milrinone.  -Blood CX obtained.  - Placed on zosyn.   2. Acute Hypoxemic Respiratory Failure  - PCCM  consulted. Required intubation.   3. Acute on Chronic Systolic Heart Failure--> Cardiogenic Shock  -ECHO this admit with severe biventricular heart failure < 20%.  - Volume management with HD. Start CVVHD today.  - HS Trop 598> 2838  - If recovers would benefit from ischemic evaluation with cath.  - On Norepi  4. PAD--> S/P LFem Pop BPG -Per [facility]   5.   Afib RVR  -On amio drip and heparin drip.   6. ESRD Nephrology consulted  - Starting on CVVHD   7. Hyperkalemia  K 6.6 --> given lokelma, insulin, glucose. Resolved K 4.9   8. CLL Followed by Dr Benay Spice  07/27/20 CT abdomen/pelvis showed extensive bulky abdominal, retroperitoneal, pelvic and inguinal adenopathy, progressed as compared to 2020 CT 07/27/2020 Had 3 inguinal lymph nodes removed - path pending.      Length of Stay: 1  Glori Bickers, MD  07/30/2020, 7:28 PM  Advanced Heart Failure Team Pager [phone] (M-F; 7a - 5p)  Please contact [facility] for night-coverage after hours (4p -7a ) and weekends on [email or web address]   Agree with above.   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  Underwent L fem-pop yesterday for non-healing ulcer and limb ischemia  CT shows diffuse bulky lymphadenopathy  Today developed severe shock with marked lactic acidosis. Lactate 8.1.  Intubated.  Echo EF < 20% severe RV dysfunction. CXR with RLL infiltrate  Now on NE 40,  milrinone 0.25 VP 0.03  Co-ox 59%   General:  Critically ill. On vent HEENT: + ETT Neck: supple. no JVD. Carotids 2+ bilat; no bruits. No lymphadenopathy or thryomegaly appreciated. Cor: PMI nondisplaced. Irregular rate & rhythm. Lungs: course Abdomen: soft, nontender, nondistended. No hepatosplenomegaly. No bruits or masses. Good bowel sounds. Extremities: no cyanosis, clubbing, rash, edema  wound on left foot with surgical dressing Neuro: intubated sedated  He is critically ill with mix of septic and cardiogenic shock. Continue support with inotropes. May need to stop milrinone if unable to maintain MAP > 65. Continue broad spectrum abx.   Prognosis likely poor.   CRITICAL CARE Performed by: Glori Bickers  Total critical care time: 45 minutes  Critical care time was exclusive of separately billable procedures and treating other patients.  Critical care was necessary to treat or prevent imminent or life-threatening deterioration.  Critical care was time spent personally by me (independent of midlevel providers or residents) on the following activities: development of treatment plan with patient and/or surrogate as well as nursing, discussions with consultants, evaluation of patient's response to treatment, examination of patient, obtaining history from patient or surrogate, ordering and performing treatments and interventions, ordering and review of laboratory studies, ordering and review of radiographic studies, pulse oximetry and re-evaluation of patient's condition.  Glori Bickers, MD  7:31 PM

## 2020-07-30 NOTE — Progress Notes (Signed)
OT Cancellation Note  Patient Details Name: Thomas Mcguire MRN: [id number] DOB: March 05, 1940   Cancelled Treatment:    Reason Eval/Treat Not Completed: Patient at procedure or test/ unavailable- pt in HD. Will follow and see as able.   [place] Pager [phone] Office [phone]   [name] 07/30/2020, 10:47 AM

## 2020-07-30 NOTE — Progress Notes (Signed)
  Progress Note    07/30/2020 11:18 AM 1 Day Post-Op  Subjective:  called to room by RN due to rapid response being called. Patient just returned from HD. Hypotensive. Only able to tolerate 30 minutes given hypotension and decreased 02 sats. Started having difficulty breathing and coughing up bloody sputum. Brought to 4E in Respiratory distress   Vitals:   07/30/20 1105 07/30/20 1106  BP:  (!) 90/48  Pulse: (!) 111 (!) 121  Resp: (!) 31 (!) 23  Temp:    SpO2:     Physical Exam: Cardiac:  irregular, tachycardic Lungs: labored, on Non rebreather Incisions:  left lower extremity incisions remain intact Extremities:  well perfused Neurologic: alert and oriented  CBC    Component Value Date/Time   WBC 17.5 (H) 07/30/2020 0312   RBC 4.02 (L) 07/30/2020 0312   HGB 11.9 (L) 07/30/2020 0312   HGB 12.1 (L) 05/30/2020 1106   HCT 38.9 (L) 07/30/2020 0312   PLT 157 07/30/2020 0312   PLT 89 (L) 05/30/2020 1106   MCV 96.8 07/30/2020 0312   MCH 29.6 07/30/2020 0312   MCHC 30.6 07/30/2020 0312   RDW 16.0 (H) 07/30/2020 0312   LYMPHSABS 7.5 (H) 05/30/2020 1106   MONOABS 0.6 05/30/2020 1106   EOSABS 0.1 05/30/2020 1106   BASOSABS 0.0 05/30/2020 1106    BMET    Component Value Date/Time   NA 133 (L) 07/30/2020 0312   K 6.6 (HH) 07/30/2020 0312   CL 91 (L) 07/30/2020 0312   CO2 21 (L) 07/30/2020 0312   GLUCOSE 228 (H) 07/30/2020 0312   BUN 70 (H) 07/30/2020 0312   CREATININE 8.95 (H) 07/30/2020 0312   CREATININE 5.72 (HH) 12/28/2017 1009   CALCIUM 9.2 07/30/2020 0312   GFRNONAA 6 (L) 07/30/2020 0312   GFRNONAA 9 (L) 12/28/2017 1009   GFRAA 12 (L) 09/20/2018 1013   GFRAA 10 (L) 12/28/2017 1009    INR    Component Value Date/Time   INR 1.1 07/23/2020 0554     Intake/Output Summary (Last 24 hours) at 07/30/2020 1118 Last data filed at 07/30/2020 0300 Gross per 24 hour  Intake 930 ml  Output 50 ml  Net 880 ml     Assessment/Plan:  80 y.o. male is s/p  left fem  popliteal bypass  1 Day Post-Op   In respiratory distress. Unable to tolerate HD this morning due to hypotension despite Albumin and bolus Chest x-ray this morning showing some pulmonary edema New onset Afib 12 lead EKG showing ST elevation, concerning for STEMI CCM consulted appreciate their assistance  Transfer order placed for [facility] ICU    [facility] [phone] 07/30/2020 11:18 AM

## 2020-07-30 NOTE — Progress Notes (Signed)
Patient ID: Thomas Mcguire, male   DOB: [birth year], 80 y.o.   MRN: [id number] S: Pt is lethargic this morning and blood pressure soft. O:BP (!) 101/38 (BP Location: Left Arm)   Pulse 76   Temp 98.6 F (37 C) (Oral)   Resp (!) 21   Ht 6\' 6"  (1.981 m)   Wt 79.5 kg   SpO2 96%   BMI 20.25 kg/m   Intake/Output Summary (Last 24 hours) at 07/30/2020 0957 Last data filed at 07/30/2020 0300 Gross per 24 hour  Intake 930 ml  Output 100 ml  Net 830 ml   Intake/Output: I/O last 3 completed shifts: In: 1330 [P.O.:480; I.V.:850] Out: 100 [Blood:100]  Intake/Output this shift:  No intake/output data recorded. Weight change: 0.12 kg WUJ:WJXBJ and ill-appearing YNW:GNFAO at 102 Resp:cta Abd: benign Ext: no edema, incision is C/D/I  Recent Labs  Lab 07/24/20 1044 07/27/20 1926 07/28/2020 0554 07/30/20 0312  NA 137 137 134* 133*  K 4.4 5.5* 5.1 6.6*  CL 96* 92* 96* 91*  CO2  --  29 24 21*  GLUCOSE 100* 129* 120* 228*  BUN 59* 61* 54* 70*  CREATININE 5.80* 7.84* 7.07* 8.95*  ALBUMIN  --  4.0 3.2*  --   CALCIUM  --  9.3 9.3 9.2  AST  --  23 17  --   ALT  --  11 12  --    Liver Function Tests: Recent Labs  Lab 07/27/20 1926 07/12/2020 0554  AST 23 17  ALT 11 12  ALKPHOS 48 51  BILITOT 0.9 1.1  PROT 6.8 6.0*  ALBUMIN 4.0 3.2*   Recent Labs  Lab 07/27/20 1926  LIPASE 37   No results for input(s): AMMONIA in the last 168 hours. CBC: Recent Labs  Lab 07/27/20 1926 08/02/2020 0554 07/30/20 0312  WBC 13.0* 10.9* 17.5*  HGB 12.8* 11.7* 11.9*  HCT 40.8 38.5* 38.9*  MCV 94.2 95.3 96.8  PLT 131* 134* 157   Cardiac Enzymes: No results for input(s): CKTOTAL, CKMB, CKMBINDEX, TROPONINI in the last 168 hours. CBG: Recent Labs  Lab 07/28/2020 0540 07/16/2020 1256 08/03/2020 1558 07/22/2020 2030 07/30/20 0609  GLUCAP 130* 183* 193* 231* 196*    Iron Studies: No results for input(s): IRON, TIBC, TRANSFERRIN, FERRITIN in the last 72 hours. Studies/Results: DG CHEST PORT 1  VIEW  Result Date: 07/30/2020 CLINICAL DATA:  Fever.  Low blood pressure. EXAM: PORTABLE CHEST 1 VIEW COMPARISON:  07/27/2020. FINDINGS: Catheter stable position. Cardiomegaly with pulmonary venous congestion. Mild bilateral interstitial prominence. Mild interstitial edema cannot be excluded. Similar findings noted on prior exam. No pleural effusion or pneumothorax. IMPRESSION: 1.  Dual-lumen catheter stable position. 2. Cardiomegaly with pulmonary venous congestion mild bilateral interstitial prominence suggesting mild interstitial edema. Similar findings noted on prior exam. Electronically Signed   By: Marcello Moores  Register   On: 07/30/2020 07:07   DG Ang/Ext/Uni/Or Left  Result Date: 07/30/2020 CLINICAL DATA:  Intraoperative arteriogram EXAM: LEFT ANG/EXT/UNI/ OR CONTRAST:  See operative note for detail FLUOROSCOPY TIME:  See operative note for detail COMPARISON:  CT abdomen/pelvis 07/27/2020 FINDINGS: A single intraoperative saved images submitted for review. The image demonstrates a patent below the knee popliteal artery bypass graft with retrograde filling of the popliteal artery as well as antegrade filling of the P3 segment of the popliteal artery. The anterior tibial artery is widely patent. The tibioperoneal trunk is patent for approximately 2 cm before occluding. No definite visualization of the posterior tibial or peroneal arteries. IMPRESSION: Intraoperative  arteriogram as above. Electronically Signed   By: Jacqulynn Cadet M.D.   On: 08/02/2020 15:24    (feeding supplement) PROSource Plus  30 mL Oral BID BM   aspirin EC  81 mg Oral Daily   Chlorhexidine Gluconate Cloth  6 each Topical Daily   docusate sodium  100 mg Oral Daily   folic acid  1 mg Oral Daily   heparin  5,000 Units Subcutaneous Q8H   insulin aspart  0-6 Units Subcutaneous TID WC   midodrine  10 mg Oral Once per day on Mon Wed Fri   multivitamin  1 tablet Oral QHS   ondansetron       pantoprazole  40 mg Oral Daily    pentoxifylline  400 mg Oral TID WC   pravastatin  40 mg Oral QHS   sucroferric oxyhydroxide  500 mg Oral TID WC   traZODone  50 mg Oral QHS    BMET    Component Value Date/Time   NA 133 (L) 07/30/2020 0312   K 6.6 (HH) 07/30/2020 0312   CL 91 (L) 07/30/2020 0312   CO2 21 (L) 07/30/2020 0312   GLUCOSE 228 (H) 07/30/2020 0312   BUN 70 (H) 07/30/2020 0312   CREATININE 8.95 (H) 07/30/2020 0312   CREATININE 5.72 (HH) 12/28/2017 1009   CALCIUM 9.2 07/30/2020 0312   GFRNONAA 6 (L) 07/30/2020 0312   GFRNONAA 9 (L) 12/28/2017 1009   GFRAA 12 (L) 09/20/2018 1013   GFRAA 10 (L) 12/28/2017 1009   CBC    Component Value Date/Time   WBC 17.5 (H) 07/30/2020 0312   RBC 4.02 (L) 07/30/2020 0312   HGB 11.9 (L) 07/30/2020 0312   HGB 12.1 (L) 05/30/2020 1106   HCT 38.9 (L) 07/30/2020 0312   PLT 157 07/30/2020 0312   PLT 89 (L) 05/30/2020 1106   MCV 96.8 07/30/2020 0312   MCH 29.6 07/30/2020 0312   MCHC 30.6 07/30/2020 0312   RDW 16.0 (H) 07/30/2020 0312   LYMPHSABS 7.5 (H) 05/30/2020 1106   MONOABS 0.6 05/30/2020 1106   EOSABS 0.1 05/30/2020 1106   BASOSABS 0.0 05/30/2020 1106    Dialysis Orders: C MWF at [facility] ([facility] patient) 4hr, 450/800, EDW 77kg, 2K/2.5Ca, UFP #2, TDC/AVF, heparin 4000 bolus - No ESA or VDRA   Assessment/Plan:  PAD/L foot wound: S/p L fem-pop bypass by Dr. Scot Dock on 7/19. Per [facility]. Foot wound bandaged and not examined today.  Extensive abdominal LAD/CLL:  Per Op note, 3 large inguinal nodes were dissected for path. Saw oncologist yesterday, plan for f/u in few weeks after recovers from surgery to consider tyrosine kinase inhibitor.  ESRD:  Continue HD per usual MWF schedule -> HD tomorrow, no heparin s/p surgery.  Hypotension/volume: Chronic low BP, on mido. UF as tolerated.  Anemia: Hgb 11.7, no need for ESA  Metabolic bone disease: Ca ok, Phos pending. No VDRA, continue home binder  Nutrition:  Alb low, adding protein supps.  T2DM Hyperkalemia  - plan for HD this morning.  Donetta Potts, MD [employer] [phone]

## 2020-07-30 NOTE — Progress Notes (Signed)
Pharmacy Antibiotic Note  Thomas Mcguire is a 80 y.o. male admitted on 07/15/2020  s/p L fem-pop bypass 7/19. Now with low grade fever .  Pharmacy has been consulted for Zosyn and vancomycin dosing. PT with ESRD - on HD. Patient started on CRRT today.   Plan: Zosyn 3.375g IV q6 hours while on crrt Vancomycin 2g IV now then 750mg  q24 hours while on CRRT Will f/u fever curve and micro data  Height: 6\' 6"  (198.1 cm) Weight: 79.5 kg (175 lb 4.3 oz) IBW/kg (Calculated) : 91.4  Temp (24hrs), Avg:99.5 F (37.5 C), Min:97.6 F (36.4 C), Max:102.3 F (39.1 C)  Recent Labs  Lab 07/27/20 1926 07/17/2020 0554 07/30/20 0312 07/30/20 1200 07/30/20 1426 07/30/20 1440 07/30/20 1748 07/30/20 1750  WBC 13.0* 10.9* 17.5* 49.2*  --   --   --  54.0*  CREATININE 7.84* 7.07* 8.95* 8.04*  --  8.98*  --  8.55*  LATICACIDVEN  --   --   --  8.1* 7.9*  --  6.8*  --      Estimated Creatinine Clearance: 8 mL/min (A) (by C-G formula based on SCr of 8.55 mg/dL (H)).    No Known Allergies  Antimicrobials this admission: 7/19 Ancef x2 7/20 Zosyn >>  Vanc 7/20>>  Microbiology results: Pending  Thank you for allowing pharmacy to be a part of this patient's care.  Sherlon Handing, PharmD, BCPS Please see amion for complete clinical pharmacist phone list 07/30/2020 8:29 PM

## 2020-07-30 NOTE — Progress Notes (Signed)
Pt placed on Bipap    07/30/20 1125  Vitals  BP (!) 119/54  MAP (mmHg) 72  BP Location Left Arm  BP Method Automatic  Patient Position (if appropriate) Lying  Pulse Rate (!) 113  Pulse Rate Source Monitor  ECG Heart Rate (!) 103  Resp (!) 29  Level of Consciousness  Level of Consciousness Responds to Voice  Oxygen Therapy  SpO2 (!) 82 %  O2 Device Bi-PAP  MEWS Score  MEWS Temp 0  MEWS Systolic 0  MEWS Pulse 1  MEWS RR 2  MEWS LOC 1  MEWS Score 4  MEWS Score Color Red

## 2020-07-30 NOTE — Progress Notes (Signed)
   07/30/20 2993  Assess: MEWS Score  Temp (!) 100.5 F (38.1 C)  BP (!) 89/45  Pulse Rate 60  ECG Heart Rate 84  Resp (!) 32  SpO2 95 %  O2 Device Room Air  Assess: MEWS Score  MEWS Temp 1  MEWS Systolic 1  MEWS Pulse 0  MEWS RR 2  MEWS LOC 0  MEWS Score 4  MEWS Score Color Red  Assess: if the MEWS score is Yellow or Red  Were vital signs taken at a resting state? Yes  Focused Assessment Change from prior assessment (see assessment flowsheet)  Early Detection of Sepsis Score *See Row Information* Low  MEWS guidelines implemented *See Row Information* Yes  Take Vital Signs  Increase Vital Sign Frequency  Red: Q 1hr X 4 then Q 4hr X 4, if remains red, continue Q 4hrs  Escalate  MEWS: Escalate Red: discuss with charge nurse/RN and provider, consider discussing with RRT  Notify: Charge Nurse/RN  Name of Charge Nurse/RN Notified Ivin Booty RN  Date Charge Nurse/RN Notified 07/30/20  Time Charge Nurse/RN Notified [phone]  Notify: Provider  Provider Name/Title Scot Dock MD  Date Provider Notified 07/30/20  Time Provider Notified [phone]  Notification Type Call  Notification Reason Change in status  Provider response En route  Date of Provider Response 07/30/20  Time of Provider Response [phone]  Lab called this RN to notify me of critical lab result of potassium 6.6, upon assessment patient was found to be in Afib, was lethargic and oriented to self and place only, patient denied chest pain, denies SOB. EKG and vital signs obtained see flowsheet.  Scot Dock MD notified of critical potassium level, vital signs and that patient was in afib. See new orders. Tylenol given for fever.  @0650 . BP remains low, Dickson MD notified, instructions received to hold Caredizem drip.

## 2020-07-30 NOTE — Significant Event (Signed)
Rapid Response Event Note   Reason for Call :  Increased O2 needs and coughing up blood  Initial Focused Assessment:  Patient in hemodialysis treatment need after 30 min.  Per staff he has been hypotensive and has received a bolus on NS and albumin.  He has also had a temperature of 102.  He has increased WOB and is frequently coughing up blood.  Lung sounds with rhonchi/crackles.   O2 via NRB, O2 sats 92-97%.  RR 26-36  BP 96/55  Returned to 4E for additional medical care. Vascular surgery called to come to bedside, PA met [place] on 4E  Patient with declining respiratory status.  Prepped for intubation.  CCM at bedside.  Attempted Bipap, patient intolerant of Bipap.  Urgently transported to [place], urgently intubated.     Interventions:    Plan of Care:  CRRT   Event Summary:   MD Notified: Donzetta Matters and CCM Call Time: 3536 Arrival Time: 1443 End Time: 1210  Raliegh Ip, RN

## 2020-07-30 NOTE — Progress Notes (Signed)
[place] for heparin Indication: chest pain/ACS  No Known Allergies  Patient Measurements: Height: 6\' 6"  (198.1 cm) Weight: 79.5 kg (175 lb 4.3 oz) IBW/kg (Calculated) : 91.4 Heparin Dosing Weight: 79kg  Vital Signs: Temp: 102.3 F (39.1 C) (07/20 0900) Temp Source: Oral (07/20 0900) BP: 101/39 (07/20 1150) Pulse Rate: 98 (07/20 1150)  Labs: Recent Labs    07/27/20 1926 07/27/20 1938 07/27/20 2135 08/05/2020 0554 07/30/20 0312  HGB 12.8*  --   --  11.7* 11.9*  HCT 40.8  --   --  38.5* 38.9*  PLT 131*  --   --  134* 157  APTT  --   --   --  28  --   LABPROT  --   --   --  14.3  --   INR  --   --   --  1.1  --   CREATININE 7.84*  --   --  7.07* 8.95*  TROPONINIHS  --  81* 75*  --   --     Estimated Creatinine Clearance: 7.6 mL/min (A) (by C-G formula based on SCr of 8.95 mg/dL (H)).   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 CBC stable, DVT ppx given ~0600 this am.   Goal of Therapy:  Heparin level 0.3-0.7 units/ml Monitor platelets by anticoagulation protocol: Yes   Plan:  Heparin 4000 units x1 then 1000 units/h Check heparin level in 8h  Arrie Senate, PharmD, [place], [facility] Clinical Pharmacist [phone] Please check AMION for all [facility] Pharmacy numbers 07/30/2020

## 2020-07-30 NOTE — Consult Note (Addendum)
Medical Consultation   Thomas Mcguire  UQJ:[id number]  DOB: Dec 01, 1940  DOA: 07/15/2020  PCP: Leanna Battles, MD    Requesting physician: Scot Dock - vascular surgery  Reason for consultation: Bypass yesterday, lots of medical problems. K+ 6.6, given insulin/glucose, for HD this AM.  New-onset afib, soft BP.  No chest pain.  Fever.  Foot wound is dry and without drainage.  CXR pending.  Anuric.  Started on Zosyn.  History of Present Illness: Thomas Mcguire is an 80 y.o. male with h/o chronic combined CHF: ESRD on MWF HD; CLL; DM; HTN; HLD; and PAD who presented on 7/19 for critical limb ischemia of the LLE and who underwent L fem-pop bypass.  At the time of my evaluation, he appeared ill and quite frail, cachectic.  He reported no specific concerns.    Review of Systems:  ROS As per HPI otherwise 10 point review of systems negative.    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   PAD (peripheral artery disease) ([place])    Wears dentures     Past Surgical History: Past Surgical History:  Procedure Laterality Date   A/V FISTULAGRAM N/A 08/11/2018   Procedure: A/V FISTULAGRAM - Right Arm;  Surgeon: Angelia Mould, MD;  Location: [facility];  Service: Cardiovascular;  Laterality: N/A;   A/V FISTULAGRAM N/A 09/07/2019   Procedure: A/V FISTULAGRAM - Right AVF;  Surgeon: Angelia Mould, MD;  Location: [place] [facility];  Service: Cardiovascular;  Laterality: N/A;   ABDOMINAL AORTOGRAM W/LOWER EXTREMITY Right 07/03/2018    Procedure: ABDOMINAL AORTOGRAM W/LOWER EXTREMITY;  Surgeon: Waynetta Sandy, MD;  Location: [place] [facility];  Service: Cardiovascular;  Laterality: Right;   ABDOMINAL AORTOGRAM W/LOWER EXTREMITY N/A 02/12/2020   Procedure: ABDOMINAL AORTOGRAM W/LOWER EXTREMITY;  Surgeon: Serafina Mitchell, MD;  Location: [place] [facility];  Service: Cardiovascular;  Laterality: N/A;   ABDOMINAL AORTOGRAM W/LOWER EXTREMITY Left 07/24/2020   Procedure: ABDOMINAL AORTOGRAM W/LOWER EXTREMITY;  Surgeon: [name], MD;  Location: [place] [facility];  Service: Cardiovascular;  Laterality: Left;   AMPUTATION  08/27/2011   Procedure: AMPUTATION DIGIT;  Surgeon: Newt Minion, MD;  Location: [place];  Service: Orthopedics;  Laterality: Left;  Left Foot 3rd toe Amputation MTP joint   AMPUTATION Right 07/11/2018   Procedure: AMPUTATION RIGHT GREAT TOE;  Surgeon: Angelia Mould, MD;  Location: [place];  Service: Vascular;  Laterality: Right;   AMPUTATION Right 02/15/2020   Procedure: TRANSMETATARSAL AMPUTATION;  Surgeon: Newt Minion, MD;  Location: [place];  Service: Orthopedics;  Laterality: Right;   amputation great toe  2009   left   AV FISTULA PLACEMENT Right 05/23/2018   Procedure: RADIAL- CEPHALIC ARTERIOVENOUS (AV) FISTULA CREATION RIGHT ARM AND LIGATION OF COMPETING BRANCH.;  Surgeon: Angelia Mould, MD;  Location: [place];  Service: Vascular;  Laterality: Right;  BASCILIC VEIN TRANSPOSITION Right 10/01/2019   Procedure: FIRST STAGE BASCILIC VEIN TRANSPOSITION;  Surgeon: Angelia Mould, MD;  Location: [place];  Service: Vascular;  Laterality: Right;   [place] Right 11/13/2019   Procedure: RIGHT SECOND STAGE [place];  Surgeon: Rosetta Posner, MD;  Location: [place];  Service: Vascular;  Laterality: Right;   COLONOSCOPY     cyst elbow  1999   right   FEMORAL-POPLITEAL BYPASS GRAFT Right 07/11/2018   Procedure: BYPASS GRAFT FEMORAL-POPLITEAL ARTERY;   Surgeon: Angelia Mould, MD;  Location: [place];  Service: Vascular;  Laterality: Right;   FEMORAL-POPLITEAL BYPASS GRAFT Left 07/28/2020   Procedure: LEFT FEMORAL-BELOW KNEE POPLITEAL ARTERY ARTERY BYPASS GRAFT  WITH EXCISION OF INGUINAL LYMPH NODES;  Surgeon: Angelia Mould, MD;  Location: [place];  Service: Vascular;  Laterality: Left;   FOOT SURGERY     both feet for something that was torn   INTRAOPERATIVE ARTERIOGRAM Left 07/13/2020   Procedure: INTRA OPERATIVE ARTERIOGRAM;  Surgeon: Angelia Mould, MD;  Location: [place] [facility];  Service: Vascular;  Laterality: Left;   IR FLUORO GUIDE CV LINE RIGHT  11/08/2019   IR US GUIDE VASC ACCESS RIGHT  11/08/2019   LIGATION OF ARTERIOVENOUS  FISTULA Right 10/01/2019   Procedure: LIGATION OF RIGHT RADIOCEPHALIC  ARTERIOVENOUS  FISTULA;  Surgeon: Angelia Mould, MD;  Location: [facility];  Service: Vascular;  Laterality: Right;   PERIPHERAL VASCULAR BALLOON ANGIOPLASTY Right 02/12/2020   Procedure: PERIPHERAL VASCULAR BALLOON ANGIOPLASTY;  Surgeon: Serafina Mitchell, MD;  Location: [place] [facility];  Service: Cardiovascular;  Laterality: Right;  peroneal   PERIPHERAL VASCULAR INTERVENTION Right 02/12/2020   Procedure: PERIPHERAL VASCULAR INTERVENTION;  Surgeon: Serafina Mitchell, MD;  Location: [facility];  Service: Cardiovascular;  Laterality: Right;  fem/pop bypass graft   TOE AMPUTATION  2010   left 2nd toe   UPPER GI ENDOSCOPY       Allergies:  No Known Allergies   Social History:  reports that he quit smoking about 24 years ago. His smoking use included cigarettes. He has never used smokeless tobacco. He reports that he does not drink alcohol and does not use drugs.   Family History: Family History  Problem Relation Age of Onset   Colon cancer Neg Hx       Physical Exam: Vitals:   07/30/20 1200 07/30/20 1500 07/30/20 1537 07/30/20 1548  BP:  (!) 89/40  (!) 86/24  Pulse:  95  100  Resp:  (!) 22  20  Temp:     99 F (37.2 C)  TempSrc:    Axillary  SpO2:  94%  96%  Weight:      Height: 6\' 6"  (1.981 m)  6\' 6"  (1.981 m)     Constitutional: Alert and awake, oriented x3, not in any acute distress.  Ill appearing, warm to touch Eyes:  EOMI, irises appear normal, anicteric sclera,  ENMT: external ears and nose appear normal, normal hearing, Lips appear normal Neck: neck appears normal, no masses, normal ROM, no thyromegaly, no JVD  CVS: S1-S2 clear, no murmur rubs or gallops Respiratory:  clear to auscultation bilaterally, no wheezing, rales or rhonchi. Respiratory effort mildly increased.  Abdomen: soft nontender, nondistended Musculoskeletal: : chronic appearing ulcer along the medial first metatarsal head      Psych: judgement and insight appear normal, stable mood and affect, mental status    Data reviewed:  I have personally reviewed the  recent labs and imaging studies  Pertinent Labs:   K+ 6.6 Glucose 228 BUN 70/Creatinine 8.95/GFR 6 Anion gap 21 Lipids controlled WBC 17.5 Hgb 11.9   Inpatient Medications:   Scheduled Meds:  (feeding supplement) PROSource Plus  30 mL Oral BID BM   [START ON 08-13-20] aspirin  81 mg Per Tube Daily   Chlorhexidine Gluconate Cloth  6 each Topical Daily   [START ON Aug 13, 2020] clopidogrel  75 mg Per Tube Daily   docusate  100 mg Per Tube BID   feeding supplement (VITAL HIGH PROTEIN)  1,000 mL Per Tube Q24H   [START ON 8/46/9629] folic acid  1 mg Per Tube Daily   insulin aspart  0-6 Units Subcutaneous TID WC   [START ON 08/01/2020] midodrine  10 mg Per Tube Once per day on Mon Wed Fri   multivitamin  1 tablet Per Tube QHS   [START ON 2020-08-13] pantoprazole sodium  40 mg Per Tube Daily   polyethylene glycol  17 g Per Tube Daily   pravastatin  40 mg Per Tube QHS   rocuronium bromide  50 mg Intravenous Once   traZODone  50 mg Per Tube QHS   Continuous Infusions:  sodium chloride     sodium chloride     sodium chloride Stopped (07/30/20  1546)   amiodarone 60 mg/hr (07/30/20 1600)   Followed by   amiodarone     dextrose 50 mL/hr at 07/30/20 1600   heparin 1,000 Units/hr (07/30/20 1600)   HYDROmorphone 0.75 mg/hr (07/30/20 1600)   magnesium sulfate bolus IVPB     milrinone 0.25 mcg/kg/min (07/30/20 1602)   norepinephrine (LEVOPHED) Adult infusion 18 mcg/min (07/30/20 1600)   piperacillin-tazobactam 100 mL/hr at 07/30/20 1600   prismasol BGK 0/2.5 300 mL/hr at 07/30/20 1509   prismasol BGK 0/2.5 300 mL/hr at 07/30/20 1511   prismasol BGK 2/2.5 dialysis solution 1,500 mL/hr at 07/30/20 1512     Radiological Exams on Admission: DG CHEST PORT 1 VIEW  Result Date: 07/30/2020 CLINICAL DATA:  New left internal jugular catheter.  Desaturations. EXAM: PORTABLE CHEST 1 VIEW COMPARISON:  Radiograph earlier today. FINDINGS: Left internal jugular central catheter tip overlies the mid SVC. No pneumothorax. There is increasing right basilar airspace disease with air bronchograms and obscuration of the hemidiaphragm. The endotracheal tube tip is 6.3 cm from the carina. Enteric tube and right dialysis catheter remain in place. Stable heart size and mediastinal contours with aortic atherosclerosis. Left costophrenic angle not entirely included in the field of view, no large left pleural effusion. IMPRESSION: 1. Left internal jugular central catheter with tip overlying the mid SVC. No pneumothorax. 2. Increasing right basilar airspace disease with air bronchograms, suspicious for pneumonia and associated pleural effusions. Electronically Signed   By: Keith Rake M.D.   On: 07/30/2020 15:37   DG CHEST PORT 1 VIEW  Result Date: 07/30/2020 CLINICAL DATA:  Endotracheal tube placement. EXAM: PORTABLE CHEST 1 VIEW COMPARISON:  Same day. FINDINGS: Stable cardiomediastinal silhouette. Right internal jugular dialysis catheter is unchanged in position. Endotracheal and nasogastric tubes are in grossly good position. No pneumothorax is noted. Left  lung is clear. Right lower lobe opacity is noted concerning for atelectasis or possibly infiltrate. Bony thorax is unremarkable. IMPRESSION: Endotracheal and nasogastric tubes are in grossly good position. Right lower lobe opacity is noted concerning for atelectasis or possibly infiltrate. Electronically Signed   By: Marijo Conception M.D.   On: 07/30/2020 13:18   DG CHEST PORT 1 VIEW  Result Date: 07/30/2020 CLINICAL DATA:  Fever.  Low blood pressure. EXAM: PORTABLE CHEST 1 VIEW COMPARISON:  07/27/2020. FINDINGS: Catheter stable position. Cardiomegaly with pulmonary venous congestion. Mild bilateral interstitial prominence. Mild interstitial edema cannot be excluded. Similar findings noted on prior exam. No pleural effusion or pneumothorax. IMPRESSION: 1.  Dual-lumen catheter stable position. 2. Cardiomegaly with pulmonary venous congestion mild bilateral interstitial prominence suggesting mild interstitial edema. Similar findings noted on prior exam. Electronically Signed   By: Marcello Moores  Register   On: 07/30/2020 07:07   DG Ang/Ext/Uni/Or Left  Result Date: 08/02/2020 CLINICAL DATA:  Intraoperative arteriogram EXAM: LEFT ANG/EXT/UNI/ OR CONTRAST:  See operative note for detail FLUOROSCOPY TIME:  See operative note for detail COMPARISON:  CT abdomen/pelvis 07/27/2020 FINDINGS: A single intraoperative saved images submitted for review. The image demonstrates a patent below the knee popliteal artery bypass graft with retrograde filling of the popliteal artery as well as antegrade filling of the P3 segment of the popliteal artery. The anterior tibial artery is widely patent. The tibioperoneal trunk is patent for approximately 2 cm before occluding. No definite visualization of the posterior tibial or peroneal arteries. IMPRESSION: Intraoperative arteriogram as above. Electronically Signed   By: Jacqulynn Cadet M.D.   On: 07/28/2020 15:24   ECHOCARDIOGRAM COMPLETE  Result Date: 07/30/2020    ECHOCARDIOGRAM  REPORT   Patient Name:   Thomas Mcguire Date of Exam: 07/30/2020 Medical Rec #:  [id number]         Height:       78.0 in Accession #:    [id number]        Weight:       175.3 lb Date of Birth:  [birth year]         BSA:          2.134 m Patient Age:    80 years          BP:           85/46 mmHg Patient Gender: M                 HR:           109 bpm. Exam Location:  Inpatient Procedure: 2D Echo, Color Doppler and Cardiac Doppler STAT ECHO Indications:    Acute MI  History:        Patient has prior history of Echocardiogram examinations, most                 recent 11/08/2019. CHF, PAD; Risk Factors:Hypertension,                 Diabetes, Dyslipidemia and ESRD.  Sonographer:    Raquel Sarna Senior RDCS Referring Phys: [id number] Kipp Brood  Sonographer Comments: Scanned supine on artificial respirator. No parasternal window. IMPRESSIONS  1. Left ventricular ejection fraction, by estimation, is <20%. The left ventricle has severely decreased function. The left ventricle demonstrates global hypokinesis. Left ventricular diastolic parameters are consistent with Grade II diastolic dysfunction (pseudonormalization).  2. Right ventricular systolic function is severely reduced. The right ventricular size is mildly enlarged. There is moderately elevated pulmonary artery systolic pressure. The estimated right ventricular systolic pressure is 62.7 mmHg.  3. Left atrial size was moderately dilated.  4. Right atrial size was moderately dilated.  5. The mitral valve is normal in structure. Mild to moderate mitral valve regurgitation. No evidence of mitral stenosis.  6. Tricuspid valve regurgitation is moderate.  7. The aortic valve is normal in structure. Aortic valve regurgitation is  mild. No aortic stenosis is present.  8. The inferior vena cava is normal in size with greater than 50% respiratory variability, suggesting right atrial pressure of 3 mmHg. Comparison(s): Prior images unable to be directly viewed, comparison made by report  only. The left ventricular function is worsened. FINDINGS  Left Ventricle: Left ventricular ejection fraction, by estimation, is <20%. The left ventricle has severely decreased function. The left ventricle demonstrates global hypokinesis. The left ventricular internal cavity size was normal in size. There is no  left ventricular hypertrophy. Left ventricular diastolic parameters are consistent with Grade II diastolic dysfunction (pseudonormalization). Right Ventricle: The right ventricular size is mildly enlarged. No increase in right ventricular wall thickness. Right ventricular systolic function is severely reduced. There is moderately elevated pulmonary artery systolic pressure. The tricuspid regurgitant velocity is 2.81 m/s, and with an assumed right atrial pressure of 15 mmHg, the estimated right ventricular systolic pressure is 71.2 mmHg. Left Atrium: Left atrial size was moderately dilated. Right Atrium: Right atrial size was moderately dilated. Pericardium: There is no evidence of pericardial effusion. Mitral Valve: The mitral valve is normal in structure. There is mild thickening of the mitral valve leaflet(s). There is mild calcification of the mitral valve leaflet(s). Mild to moderate mitral valve regurgitation. No evidence of mitral valve stenosis. Tricuspid Valve: The tricuspid valve is normal in structure. Tricuspid valve regurgitation is moderate . No evidence of tricuspid stenosis. Aortic Valve: The aortic valve is normal in structure. Aortic valve regurgitation is mild. No aortic stenosis is present. Pulmonic Valve: The pulmonic valve was normal in structure. Pulmonic valve regurgitation is not visualized. No evidence of pulmonic stenosis. Aorta: The aortic root is normal in size and structure. Venous: The inferior vena cava is normal in size with greater than 50% respiratory variability, suggesting right atrial pressure of 3 mmHg. IAS/Shunts: There is redundancy of the interatrial septum. No atrial  level shunt detected by color flow Doppler.  LEFT VENTRICLE PLAX 2D LVOT diam:     2.20 cm LV SV:         42 LV SV Index:   20 LVOT Area:     3.80 cm  LV Volumes (MOD) LV vol d, MOD A2C: 178.0 ml LV vol d, MOD A4C: 200.0 ml LV vol s, MOD A2C: 148.0 ml LV vol s, MOD A4C: 170.0 ml LV SV MOD A2C:     30.0 ml LV SV MOD A4C:     200.0 ml LV SV MOD BP:      30.7 ml RIGHT VENTRICLE RV S prime:     4.13 cm/s TAPSE (M-mode): 0.9 cm LEFT ATRIUM              Index       RIGHT ATRIUM           Index LA Vol (A2C):   111.0 ml 52.01 ml/m RA Area:     14.50 cm LA Vol (A4C):   86.1 ml  40.34 ml/m RA Volume:   36.90 ml  17.29 ml/m LA Biplane Vol: 98.1 ml  45.96 ml/m  AORTIC VALVE LVOT Vmax:   59.90 cm/s LVOT Vmean:  44.300 cm/s LVOT VTI:    0.110 m MITRAL VALVE               TRICUSPID VALVE MV Area (PHT): 4.65 cm    TR Peak grad:   31.6 mmHg MV Decel Time: 163 msec    TR Vmax:        281.00 cm/s  MV E velocity: 97.50 cm/s                            SHUNTS                            Systemic VTI:  0.11 m                            Systemic Diam: 2.20 cm Candee Furbish MD Electronically signed by Candee Furbish MD Signature Date/Time: 07/30/2020/1:36:19 PM    Final     Impression/Recommendations Active Problems:   PAD (peripheral artery disease) (HCC)   Acute on chronic combined systolic and diastolic CHF (congestive heart failure) (HCC)   Peripheral arterial occlusive disease (HCC)   Acute respiratory failure with hypoxia (HCC)   Atrial fibrillation with RVR (HCC)   Cardiogenic shock (HCC)   Severe sepsis with septic shock (HCC)   Cardiogenic postoperative shock (HCC)  Shock -I was asked to consult on this patient for fever with sepsis physiology this AM -At the time of my consult, he was ill-appearing but medically stable -He appeared to have sepsis at that time without shock -He went to HD and appears to have significantly decompensated - he is now intubated and in the ICU with PCCM consultation. -He currently  appears to have both septic and cardiogenic shock with overall very guarded prognosis. -TRH will sign off at this time. -Once he is medically stable and appropriate for transfer out of the ICU, we will be happy to resume care of this patient.   Total critical care time: 45 minutes Critical care time was exclusive of separately billable procedures and treating other patients. Critical care was necessary to treat or prevent imminent or life-threatening deterioration. Critical care was time spent personally by me on the following activities: development of treatment plan with patient and/or surrogate as well as nursing, discussions with consultants, evaluation of patient's response to treatment, examination of patient, obtaining history from patient or surrogate, ordering and performing treatments and interventions, ordering and review of laboratory studies, ordering and review of radiographic studies, pulse oximetry and re-evaluation of patient's condition.     Thank you for this consultation.  Our [facility] hospitalist team will be happy to resume care after his critical illness is resolved.   Time Spent: 51 minutes  Karmen Bongo M.D. [facility] 07/30/2020, 4:21 PM

## 2020-07-30 NOTE — Procedures (Signed)
Arterial Catheter Insertion Procedure Note  LENFORD BEDDOW  [id number]  1940/04/09  Date:07/30/20  Time:3:45 PM    Provider Performing: Corey Harold    Procedure: Insertion of Arterial Line [phone]) with US guidance ([id number])   Indication(s) Blood pressure monitoring and/or need for frequent ABGs  Consent Risks of the procedure as well as the alternatives and risks of each were explained to the patient and/or caregiver.  Consent for the procedure was obtained and is signed in the bedside chart  Anesthesia None   Time Out Verified patient identification, verified procedure, site/side was marked, verified correct patient position, special equipment/implants available, medications/allergies/relevant history reviewed, required imaging and test results available.   Sterile Technique Maximal sterile technique including full sterile barrier drape, hand hygiene, sterile gown, sterile gloves, mask, hair covering, sterile ultrasound probe cover (if used).   Procedure Description Area of catheter insertion was cleaned with chlorhexidine and draped in sterile fashion. With real-time ultrasound guidance an arterial catheter was placed into the left  axillary  artery.  Appropriate arterial tracings confirmed on monitor.     Complications/Tolerance None; patient tolerated the procedure well.   EBL Minimal   Specimen(s) None   Georgann Housekeeper, AGACNP-BC [facility]  See Amion for personal pager PCCM on call pager [phone] until 7pm. Please call Elink 7p-7a. [phone]  07/30/2020 3:46 PM

## 2020-07-30 NOTE — Progress Notes (Signed)
Pt arrived to unit from dialysis with rapid at bedside. Dialysis stated pt continued to decrease and started vomiting blood. Pt went to RA to non-rebreather. Respiratory and rapid response RN and vascular PA at bedside  Pt currently on Non-breather very lethargic.  EKG obtained.     07/30/20 1106  Vitals  BP (!) 90/48  MAP (mmHg) (!) 61  BP Location Left Arm  BP Method Automatic  Patient Position (if appropriate) Lying  Pulse Rate (!) 121  Pulse Rate Source Monitor  ECG Heart Rate (!) 118  Resp (!) 23  Level of Consciousness  Level of Consciousness Responds to Voice  Oxygen Therapy  O2 Device Non-rebreather Mask  MEWS Score  MEWS Temp 0  MEWS Systolic 1  MEWS Pulse 2  MEWS RR 1  MEWS LOC 1  MEWS Score 5  MEWS Score Color Red

## 2020-07-30 NOTE — Progress Notes (Signed)
NAME:  Thomas Mcguire, MRN:  [id number], DOB:  March 31, 1940, LOS: 1 ADMISSION DATE:  07/21/2020, CONSULTATION DATE:  07/11/2020 REFERRING MD:  Scot Dock - VVS, CHIEF COMPLAINT:  hypotension and hypoxia   History of Present Illness:  80 year old male vasculopath who underwent elective L femoral popliteal grafting 7/19 for a non-healing foot wound.   ESRD with chronic hypotension requiring midodrine for MWF HD. T2DM, CLL.  Was dialyzed on normal schedule and was due for HD  today.  OR uneventful and bulky nodes also sampled at the same time. No symptoms apart from expected post operative pain.   Rapid AF this morning, fever and elevated potassium.  Initially no distress, but mildly confused.  Unable to do HD due to hypotension to the 70's. No improvement in spite fluid bolus.  Began desaturating oxygen therapy escalated to nonrebreather.  PCCM called to evaluate.   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 7/20 brought to ICU for respiratory distress and hypotension. 7/20 intubated for respiratory distress.  Interim History / Subjective:  When seen initially prior to intubation the patient was in moderate respiratory distress but denied chest pain.  Complained only of  constipation.  Objective   Blood pressure (!) 101/39, pulse 98, temperature (!) 102.3 F (39.1 C), temperature source Oral, resp. rate (!) 22, height _0  (1.981 m), weight 79.5 kg, SpO2 100 %.    Vent Mode: PRVC FiO2 (%):  [100 %] 100 % Set Rate:  [22 bmp] 22 bmp Vt Set:  [650 mL] 650 mL PEEP:  [5 cmH20] 5 cmH20 Plateau Pressure:  [21 cmH20] 21 cmH20   Intake/Output Summary (Last 24 hours) at 07/30/2020 1217 Last data filed at 07/30/2020 0300 Gross per 24 hour  Intake 530 ml  Output --  Net 530 ml   Filed Weights   08/02/2020 0543 07/30/20 0410  Weight: 79.4 kg 79.5 kg    Examination: General: Thin borderline cachectic male in moderate respiratory distress. HENT: Dry mucosa.  No scleral icterus. Lungs: Bibasilar crackles Cardiovascular: JVP not visible.  Heart sounds unremarkable. Abdomen: Abdomen soft and nontender Extremities: No peripheral edema.  Surgical incisions clean.  Prior left forefoot amputation.  Wounds clean.  No edema.  Extremities with warm bilaterally. Neuro: Awake and communicative.  No focal deficits. GU: Anuric.  Resolved Hospital Problem list   None.  Assessment & Plan:  Critically ill due to acute hypoxic respiratory failure requiring mechanical ventilation Hyperkalemia secondary to acute on chronic renal failure Borderline hypotension due to possible acute coronary syndrome Known cardiomyopathy with ejection fraction 35 to 40% Peripheral vascular disease status post left femoral-popliteal bypass Type 2 diabetes  Plan:  -  Admit to ICU.  Intubated for mechanical ventilation. -Initiate CRRT for fluid removal and correction of hyperkalemia -Serial troponin, echocardiogram, repeat EKG.  Discussed initial ECG with Dr. Burt Knack.  No clear indication for angiography at this time.  We will revisit if worsening ST segment elevation. -Continue broad-spectrum antibiotics.  Best Practice (right click and "Reselect all SmartList Selections" daily)    Diet/type: tubefeeds DVT prophylaxis: systemic heparin GI prophylaxis: PPI Lines: Dialysis Catheter Foley:  Yes, and it is no longer needed Code Status:  full code Last date of multidisciplinary goals of care discussion [7/20 - wife updated]  Labs   CBC: Recent Labs  Lab 07/24/20 1044 07/27/20 1926 08/09/2020 0554 07/30/20 0312  WBC  --  13.0* 10.9* 17.5*  HGB 14.3 12.8* 11.7* 11.9*  HCT 42.0 40.8 38.5* 38.9*  MCV  --  94.2 95.3 96.8  PLT  --  131* 134* 983    Basic Metabolic Panel: Recent Labs  Lab 07/24/20 1044 07/27/20 1926 08/05/2020 0554 07/30/20 0312  NA 137 137 134* 133*  K 4.4 5.5* 5.1 6.6*  CL 96* 92* 96* 91*  CO2  --  29 24 21*  GLUCOSE 100* 129* 120* 228*  BUN 59* 61* 54* 70*  CREATININE 5.80* 7.84* 7.07* 8.95*  CALCIUM  --  9.3 9.3 9.2   GFR: Estimated Creatinine Clearance: 7.6 mL/min (A) (by C-G formula based on SCr of 8.95 mg/dL (H)). Recent Labs  Lab 07/27/20 1926 08/01/2020 0554 07/30/20 0312  WBC 13.0* 10.9* 17.5*    Liver Function Tests: Recent Labs  Lab 07/27/20 1926 07/26/2020 0554  AST 23 17  ALT 11 12  ALKPHOS 48 51  BILITOT 0.9 1.1  PROT 6.8 6.0*  ALBUMIN 4.0 3.2*   Recent Labs  Lab 07/27/20 1926  LIPASE 37   No results for input(s): AMMONIA in the last 168 hours.  ABG    Component Value Date/Time   PHART 7.486 (H) 07/24/2020 0643   PCO2ART 34.6 07/15/2020 0643   PO2ART 96.2 07/13/2020 0643   HCO3 25.9 08/06/2020 0643   TCO2 33 (H) 07/24/2020 1044   O2SAT 97.4 07/27/2020 0643     Coagulation Profile: Recent Labs  Lab 08/07/2020 0554  INR 1.1    Cardiac Enzymes: No results for input(s): CKTOTAL, CKMB, CKMBINDEX, TROPONINI in the last 168 hours.  HbA1C: Hgb A1c MFr Bld  Date/Time Value Ref Range Status  02/11/2020 02:18 AM 6.1 (H) 4.8 - 5.6 % Final    Comment:    (NOTE) Pre diabetes:          5.7%-6.4%  Diabetes:              >6.4%  Glycemic control for   <7.0% adults with diabetes   11/07/2019 07:02  PM 6.2 (H) 4.8 - 5.6 % Final    Comment:    (NOTE)         Prediabetes: 5.7 - 6.4         Diabetes: >6.4         Glycemic control for adults with diabetes: <7.0     CBG: Recent Labs  Lab 07/22/2020 0540 07/11/2020 1256 07/20/2020 1558 07/27/2020 2030 07/30/20 0609  GLUCAP 130* 183* 193* 231* 196*    Review of Systems:   Unable to complete due to critical illness.  Past Medical History:  He,  has a past medical history of Anemia, Arthritis, CHF (congestive heart failure) ([place]) (11/08/2019), CLL (chronic lymphocytic leukemia) ([place]), Diabetes mellitus, ESRD (end stage renal disease) (  [place]), Gout, Hyperlipidemia, Hypertension, Neuromuscular disorder ([place]), PAD (peripheral artery disease) ([place]), and Wears dentures.   Surgical History:   Past Surgical History:  Procedure Laterality Date   A/V FISTULAGRAM N/A 08/11/2018   Procedure: A/V FISTULAGRAM - Right Arm;  Surgeon: Angelia Mould, MD;  Location: [facility];  Service: Cardiovascular;  Laterality: N/A;   A/V FISTULAGRAM N/A 09/07/2019   Procedure: A/V FISTULAGRAM - Right AVF;  Surgeon: Angelia Mould, MD;  Location: [facility];  Service: Cardiovascular;  Laterality: N/A;   ABDOMINAL AORTOGRAM W/LOWER EXTREMITY Right 07/03/2018   Procedure: ABDOMINAL AORTOGRAM W/LOWER EXTREMITY;  Surgeon: Waynetta Sandy, MD;  Location: [facility];  Service: Cardiovascular;  Laterality: Right;   ABDOMINAL AORTOGRAM W/LOWER EXTREMITY N/A 02/12/2020   Procedure: ABDOMINAL AORTOGRAM W/LOWER EXTREMITY;  Surgeon: Serafina Mitchell, MD;  Location: [facility];  Service: Cardiovascular;  Laterality: N/A;   ABDOMINAL AORTOGRAM W/LOWER EXTREMITY Left 07/24/2020   Procedure: ABDOMINAL AORTOGRAM W/LOWER EXTREMITY;  Surgeon: [name], MD;  Location: [facility];  Service: Cardiovascular;  Laterality: Left;   AMPUTATION  08/27/2011   Procedure: AMPUTATION DIGIT;  Surgeon: Newt Minion, MD;  Location:  [facility];  Service: Orthopedics;  Laterality: Left;  Left Foot 3rd toe Amputation MTP joint   AMPUTATION Right 07/11/2018   Procedure: AMPUTATION RIGHT GREAT TOE;  Surgeon: Angelia Mould, MD;  Location: [facility];  Service: Vascular;  Laterality: Right;   AMPUTATION Right 02/15/2020   Procedure: TRANSMETATARSAL AMPUTATION;  Surgeon: Newt Minion, MD;  Location: [facility];  Service: Orthopedics;  Laterality: Right;   amputation great toe  2009   left   AV FISTULA PLACEMENT Right 05/23/2018   Procedure: RADIAL- CEPHALIC ARTERIOVENOUS (AV) FISTULA CREATION RIGHT ARM AND LIGATION OF COMPETING BRANCH.;  Surgeon: Angelia Mould, MD;  Location: [facility];  Service: Vascular;  Laterality: Right;   [place] Right 10/01/2019   Procedure: FIRST STAGE [place];  Surgeon: Angelia Mould, MD;  Location: [facility];  Service: Vascular;  Laterality: Right;   [place] Right 11/13/2019   Procedure: RIGHT SECOND STAGE [place];  Surgeon: Rosetta Posner, MD;  Location: [facility];  Service: Vascular;  Laterality: Right;   COLONOSCOPY     cyst elbow  1999   right   FEMORAL-POPLITEAL BYPASS GRAFT Right 07/11/2018   Procedure: BYPASS GRAFT FEMORAL-POPLITEAL ARTERY;  Surgeon: Angelia Mould, MD;  Location: [facility];  Service: Vascular;  Laterality: Right;   FEMORAL-POPLITEAL BYPASS GRAFT Left 07/20/2020   Procedure: LEFT FEMORAL-BELOW KNEE POPLITEAL ARTERY ARTERY BYPASS GRAFT  WITH EXCISION OF INGUINAL LYMPH NODES;  Surgeon: Angelia Mould, MD;  Location: [place] [facility];  Service: Vascular;  Laterality: Left;   FOOT SURGERY     both feet for something that was torn   INTRAOPERATIVE ARTERIOGRAM Left 08/10/2020   Procedure: INTRA OPERATIVE ARTERIOGRAM;  Surgeon: Angelia Mould, MD;  Location: [facility];  Service: Vascular;  Laterality: Left;   IR FLUORO GUIDE CV LINE RIGHT  11/08/2019   IR US GUIDE VASC ACCESS RIGHT  11/08/2019   LIGATION OF  ARTERIOVENOUS  FISTULA Right 10/01/2019   Procedure: LIGATION OF RIGHT RADIOCEPHALIC  ARTERIOVENOUS  FISTULA;  Surgeon: Angelia Mould, MD;  Location: [facility] OR;  Service: Vascular;  Laterality: Right;   PERIPHERAL VASCULAR BALLOON ANGIOPLASTY Right 02/12/2020   Procedure: PERIPHERAL VASCULAR BALLOON ANGIOPLASTY;  Surgeon: Serafina Mitchell, MD;  Location:  [facility];  Service: Cardiovascular;  Laterality: Right;  peroneal   PERIPHERAL VASCULAR INTERVENTION Right 02/12/2020   Procedure: PERIPHERAL VASCULAR INTERVENTION;  Surgeon: Serafina Mitchell, MD;  Location: [facility] [place] [facility];  Service: Cardiovascular;  Laterality: Right;  fem/pop bypass graft   TOE AMPUTATION  2010   left 2nd toe   UPPER GI ENDOSCOPY       Social History:   reports that he quit smoking about 24 years ago. His smoking use included cigarettes. He has never used smokeless tobacco. He reports that he does not drink alcohol and does not use drugs.   Family History:  His family history is negative for Colon cancer.   Allergies No Known Allergies   Home Medications  Prior to Admission medications   Medication Sig Start Date End Date Taking? Authorizing Provider  aspirin EC 81 MG EC tablet Take 1 tablet (81 mg total) by mouth daily. Swallow whole. 02/21/20  Yes Shelly Coss, MD  B Complex-C-Folic Acid (NEPHRO-VITE RX PO) Take 1 tablet by mouth at bedtime. 11/27/19  Yes [provider]  folic acid (FOLVITE) 1 MG tablet TAKE 1 TABLET BY MOUTH EVERY DAY Patient taking differently: Take 1 mg by mouth daily. 03/14/20  Yes Ladell Pier, MD  insulin NPH Human (NOVOLIN N) 100 UNIT/ML injection Inject 0.5 mLs (50 Units total) into the skin daily before breakfast. Patient taking differently: Inject 50 Units into the skin daily as needed (high blood sugar). 11/14/19  Yes Vann, Jessica U, DO  midodrine (PROAMATINE) 10 MG tablet Take 10 mg by mouth 3 (three) times a week. 11/22/19  Yes [provider]   nitroGLYCERIN (NITRODUR - DOSED IN MG/24 HR) 0.2 mg/hr patch Place 1 patch (0.2 mg total) onto the skin daily. 07/11/20  Yes Newt Minion, MD  pravastatin (PRAVACHOL) 40 MG tablet Take 40 mg by mouth at bedtime.   Yes [provider]  sucroferric oxyhydroxide (VELPHORO) 500 MG chewable tablet Chew 1 tablet (500 mg total) by mouth 3 (three) times daily with meals. 02/20/20  Yes Shelly Coss, MD  traZODone (DESYREL) 50 MG tablet Take 50 mg by mouth at bedtime. 03/14/20  Yes [provider]  vitamin B-12 (CYANOCOBALAMIN) 1000 MCG tablet Take 1,000 mcg by mouth daily.   Yes [provider]  Accu-Chek FastClix Lancets MISC Use one lancet 5 times every day  DX:  250.00 12/30/11   [provider]  Blood Glucose Calibration (ACCU-CHEK AVIVA) SOLN U.A.D TO CALIBRATE METER 11/04/11   [provider]  Blood Glucose Monitoring Suppl (ACCU-CHEK AVIVA PLUS) w/Device KIT Use meter to check blood sugar 10/20/11   [provider]  COVID-19 mRNA Vac-TriS, Pfizer, (PFIZER-BIONT COVID-19 VAC-TRIS) SUSP injection Inject into the muscle. 05/30/20   Carlyle Basques, MD  glucose blood (ACCU-CHEK AVIVA PLUS) test strip Check blood sugar 5 times daily (dx E11.49) 10/13/11   [provider]  Insulin Syringe-Needle U-100 31G X 5/16" 0.5 ML MISC Use one new syringe 6 times daily for insulin DX E11.49 02/08/17   [provider]  Lancets Misc. (ACCU-CHEK FASTCLIX LANCET) KIT u.a.d to test BS 5 times daily 11/04/11   [provider]  oxyCODONE (OXY IR/ROXICODONE) 5 MG immediate release tablet Take 1-2 tablets (5-10 mg total) by mouth every 4 (four) hours as needed for moderate pain (pain score 4-6). 02/20/20   Shelly Coss, MD  pentoxifylline (TRENTAL) 400 MG CR tablet Take 1 tablet (400 mg total) by mouth 3 (three)  times daily with meals. 07/11/20   Newt Minion, MD    CRITICAL CARE Performed by: Kipp Brood   Total critical care time: 45  minutes  Critical care time was exclusive of separately billable procedures and treating other patients.  Critical care was necessary to treat or prevent imminent or life-threatening deterioration.  Critical care was time spent personally by me on the following activities: development of treatment plan with patient and/or surrogate as well as nursing, discussions with consultants, evaluation of patient's response to treatment, examination of patient, obtaining history from patient or surrogate, ordering and performing treatments and interventions, ordering and review of laboratory studies, ordering and review of radiographic studies, pulse oximetry, re-evaluation of patient's condition and participation in multidisciplinary rounds.  Kipp Brood, MD [facility] ICU Physician [place]  Pager: [phone] Mobile: [phone] After hours: [phone]. 33

## 2020-07-30 NOTE — Progress Notes (Signed)
Inpatient Diabetes Program Recommendations  AACE/ADA: New Consensus Statement on Inpatient Glycemic Control (2015)  Target Ranges:  Prepandial:   less than 140 mg/dL      Peak postprandial:   less than 180 mg/dL (1-2 hours)      Critically ill patients:  140 - 180 mg/dL   Lab Results  Component Value Date   GLUCAP 196 (H) 07/30/2020   HGBA1C 6.1 (H) 02/11/2020    Review of Glycemic Control Results for Thomas Mcguire, Thomas Mcguire (MRN [id number]) as of 07/30/2020 10:16  Ref. Range 07/17/2020 05:40 07/14/2020 12:56 07/28/2020 15:58 08/04/2020 20:30 07/30/2020 06:09  Glucose-Capillary Latest Ref Range: 70 - 99 mg/dL 130 (H) 183 (H) 193 (H) 231 (H) 196 (H)   Diabetes history: DM 2 Outpatient Diabetes medications: NPH 50 units Daily Current orders for Inpatient glycemic control:  Novolog 0-6 units tid  Inpatient Diabetes Program Recommendations:    Note Decadron 5 mg given yesterday  - increase Novolog Correction to 0-9 units tid + hs scale  Thanks,  Tama Headings RN, MSN, BC-ADM Inpatient Diabetes Coordinator Team Pager [phone] (8a-5p)

## 2020-07-30 NOTE — Procedures (Signed)
Central Venous Catheter Insertion Procedure Note  Thomas Mcguire  [id number]  09-Nov-1940  Date:07/30/20  Time:2:54 PM   Provider Performing:Latroy Gaymon D Rollene Rotunda   Procedure: Insertion of Non-tunneled Central Venous [phone]) with US guidance (24268)   Indication(s) Medication administration  Consent Risks of the procedure as well as the alternatives and risks of each were explained to the patient and/or caregiver.  Consent for the procedure was obtained and is signed in the bedside chart  Anesthesia Topical only with 1% lidocaine   Timeout Verified patient identification, verified procedure, site/side was marked, verified correct patient position, special equipment/implants available, medications/allergies/relevant history reviewed, required imaging and test results available.  Sterile Technique Maximal sterile technique including full sterile barrier drape, hand hygiene, sterile gown, sterile gloves, mask, hair covering, sterile ultrasound probe cover (if used).  Procedure Description Area of catheter insertion was cleaned with chlorhexidine and draped in sterile fashion.  With real-time ultrasound guidance a central venous catheter was placed into the left internal jugular vein. Nonpulsatile blood flow and easy flushing noted in all ports.  The catheter was sutured in place and sterile dressing applied.     Complications/Tolerance None; patient tolerated the procedure well. Chest X-ray is ordered to verify placement for internal jugular or subclavian cannulation.   Chest x-ray is not ordered for femoral cannulation.  EBL Minimal  Specimen(s) None  JD Rexene Agent [facility] 07/30/2020, 2:55 PM  Please see [email or web address] for pager details.  From 7A-7P if no response, please call [phone]. After hours, please call ELink [phone].

## 2020-07-30 NOTE — Progress Notes (Addendum)
Echocardiogram 2D Echocardiogram has been performed.  Oneal Deputy Tony Friscia RDCS 07/30/2020, 12:26 PM  Dr. Marlou Porch notified of stat at 12:35

## 2020-07-31 ENCOUNTER — Inpatient Hospital Stay (HOSPITAL_COMMUNITY): Payer: Medicare Other

## 2020-07-31 DIAGNOSIS — Z66 Do not resuscitate: Secondary | ICD-10-CM | POA: Diagnosis not present

## 2020-07-31 DIAGNOSIS — I5043 Acute on chronic combined systolic (congestive) and diastolic (congestive) heart failure: Secondary | ICD-10-CM

## 2020-07-31 DIAGNOSIS — J9601 Acute respiratory failure with hypoxia: Secondary | ICD-10-CM | POA: Diagnosis not present

## 2020-07-31 LAB — POCT I-STAT, CHEM 8
BUN: 51 mg/dL — ABNORMAL HIGH (ref 8–23)
Calcium, Ion: 1.2 mmol/L (ref 1.15–1.40)
Chloride: 94 mmol/L — ABNORMAL LOW (ref 98–111)
Creatinine, Ser: 7.6 mg/dL — ABNORMAL HIGH (ref 0.61–1.24)
Glucose, Bld: 117 mg/dL — ABNORMAL HIGH (ref 70–99)
HCT: 40 % (ref 39.0–52.0)
Hemoglobin: 13.6 g/dL (ref 13.0–17.0)
Potassium: 5.7 mmol/L — ABNORMAL HIGH (ref 3.5–5.1)
Sodium: 132 mmol/L — ABNORMAL LOW (ref 135–145)
TCO2: 30 mmol/L (ref 22–32)

## 2020-07-31 LAB — BASIC METABOLIC PANEL
Anion gap: 23 — ABNORMAL HIGH (ref 5–15)
BUN: 40 mg/dL — ABNORMAL HIGH (ref 8–23)
CO2: 16 mmol/L — ABNORMAL LOW (ref 22–32)
Calcium: 7.2 mg/dL — ABNORMAL LOW (ref 8.9–10.3)
Chloride: 105 mmol/L (ref 98–111)
Creatinine, Ser: 4.43 mg/dL — ABNORMAL HIGH (ref 0.61–1.24)
GFR, Estimated: 13 mL/min — ABNORMAL LOW (ref >60)
Glucose, Bld: 102 mg/dL — ABNORMAL HIGH (ref 70–99)
Potassium: 4.9 mmol/L (ref 3.5–5.1)
Sodium: 144 mmol/L (ref 135–145)

## 2020-07-31 LAB — POCT I-STAT 7, (LYTES, BLD GAS, ICA,H+H)
Acid-base deficit: 16 mmol/L — ABNORMAL HIGH (ref 0.0–2.0)
Acid-base deficit: 19 mmol/L — ABNORMAL HIGH (ref 0.0–2.0)
Acid-base deficit: 5 mmol/L — ABNORMAL HIGH (ref 0.0–2.0)
Bicarbonate: 11.2 mmol/L — ABNORMAL LOW (ref 20.0–28.0)
Bicarbonate: 12.6 mmol/L — ABNORMAL LOW (ref 20.0–28.0)
Bicarbonate: 23.6 mmol/L (ref 20.0–28.0)
Calcium, Ion: 1.03 mmol/L — ABNORMAL LOW (ref 1.15–1.40)
Calcium, Ion: 1.11 mmol/L — ABNORMAL LOW (ref 1.15–1.40)
Calcium, Ion: 1.28 mmol/L (ref 1.15–1.40)
HCT: 28 % — ABNORMAL LOW (ref 39.0–52.0)
HCT: 29 % — ABNORMAL LOW (ref 39.0–52.0)
HCT: 35 % — ABNORMAL LOW (ref 39.0–52.0)
Hemoglobin: 11.9 g/dL — ABNORMAL LOW (ref 13.0–17.0)
Hemoglobin: 9.5 g/dL — ABNORMAL LOW (ref 13.0–17.0)
Hemoglobin: 9.9 g/dL — ABNORMAL LOW (ref 13.0–17.0)
O2 Saturation: 73 %
O2 Saturation: 89 %
O2 Saturation: 90 %
Patient temperature: 33
Patient temperature: 34
Patient temperature: 34.2
Potassium: 4.5 mmol/L (ref 3.5–5.1)
Potassium: 4.6 mmol/L (ref 3.5–5.1)
Potassium: 4.9 mmol/L (ref 3.5–5.1)
Sodium: 133 mmol/L — ABNORMAL LOW (ref 135–145)
Sodium: 137 mmol/L (ref 135–145)
Sodium: 143 mmol/L (ref 135–145)
TCO2: 13 mmol/L — ABNORMAL LOW (ref 22–32)
TCO2: 14 mmol/L — ABNORMAL LOW (ref 22–32)
TCO2: 25 mmol/L (ref 22–32)
pCO2 arterial: 34.3 mmHg (ref 32.0–48.0)
pCO2 arterial: 41 mmHg (ref 32.0–48.0)
pCO2 arterial: 52.8 mmHg — ABNORMAL HIGH (ref 32.0–48.0)
pH, Arterial: 7.027 — CL (ref 7.350–7.450)
pH, Arterial: 7.155 — CL (ref 7.350–7.450)
pH, Arterial: 7.235 — ABNORMAL LOW (ref 7.350–7.450)
pO2, Arterial: 48 mmHg — ABNORMAL LOW (ref 83.0–108.0)
pO2, Arterial: 57 mmHg — ABNORMAL LOW (ref 83.0–108.0)
pO2, Arterial: 62 mmHg — ABNORMAL LOW (ref 83.0–108.0)

## 2020-07-31 LAB — RENAL FUNCTION PANEL
Albumin: 2.3 g/dL — ABNORMAL LOW (ref 3.5–5.0)
Anion gap: 25 — ABNORMAL HIGH (ref 5–15)
BUN: 51 mg/dL — ABNORMAL HIGH (ref 8–23)
CO2: 17 mmol/L — ABNORMAL LOW (ref 22–32)
Calcium: 7.6 mg/dL — ABNORMAL LOW (ref 8.9–10.3)
Chloride: 96 mmol/L — ABNORMAL LOW (ref 98–111)
Creatinine, Ser: 5.97 mg/dL — ABNORMAL HIGH (ref 0.61–1.24)
GFR, Estimated: 9 mL/min — ABNORMAL LOW (ref >60)
Glucose, Bld: 82 mg/dL (ref 70–99)
Phosphorus: 7.2 mg/dL — ABNORMAL HIGH (ref 2.5–4.6)
Potassium: 4.7 mmol/L (ref 3.5–5.1)
Sodium: 138 mmol/L (ref 135–145)

## 2020-07-31 LAB — LACTIC ACID, PLASMA
Lactic Acid, Venous: >11 mmol/L (ref 0.5–1.9)
Lactic Acid, Venous: 9.3 mmol/L (ref 0.5–1.9)

## 2020-07-31 LAB — TROPONIN I (HIGH SENSITIVITY)
Troponin I (High Sensitivity): 8453 ng/L (ref <18)
Troponin I (High Sensitivity): 7542 ng/L (ref <18)

## 2020-07-31 MED ORDER — CALCIUM GLUCONATE-NACL 1-0.675 GM/50ML-% IV SOLN
1.0000 g | Freq: Once | INTRAVENOUS | Status: AC
  Administered 2020-07-31: 1000 mg via INTRAVENOUS
  Filled 2020-07-31: qty 50

## 2020-07-31 MED ORDER — ALBUMIN HUMAN 25 % IV SOLN: 25.0000 g | Freq: Once | INTRAVENOUS | Status: AC

## 2020-07-31 MED ORDER — SODIUM BICARBONATE 8.4 % IV SOLN
INTRAVENOUS | Status: DC
  Filled 2020-07-31: qty 1000

## 2020-07-31 MED ORDER — SODIUM CHLORIDE 0.9 % IV BOLUS: 1000.0000 mL | Freq: Once | INTRAVENOUS | Status: DC

## 2020-07-31 MED ORDER — SODIUM BICARBONATE 8.4 % IV SOLN
INTRAVENOUS | Status: AC
  Administered 2020-07-31: 50 meq via INTRAVENOUS
  Filled 2020-07-31: qty 50

## 2020-07-31 MED ORDER — CALCIUM CHLORIDE 10 % IV SOLN
INTRAVENOUS | Status: AC
  Administered 2020-07-31: 1000 mg
  Filled 2020-07-31: qty 10

## 2020-07-31 MED ORDER — DEXTROSE 50 % IV SOLN
INTRAVENOUS | Status: AC
  Filled 2020-07-31: qty 50

## 2020-07-31 MED ORDER — SODIUM CHLORIDE 0.9 % IV SOLN
1.2500 ng/kg/min | INTRAVENOUS | Status: DC
  Administered 2020-07-31: 10 ng/kg/min via INTRAVENOUS
  Filled 2020-07-31: qty 1

## 2020-07-31 MED ORDER — CALCIUM CHLORIDE 10 % IV SOLN: 1.0000 g | Freq: Once | INTRAVENOUS | Status: AC

## 2020-07-31 MED ORDER — SODIUM BICARBONATE 8.4 % IV SOLN: 50.0000 meq | Freq: Once | INTRAVENOUS | Status: AC

## 2020-08-01 LAB — SURGICAL PATHOLOGY

## 2020-08-04 LAB — CULTURE, BLOOD (ROUTINE X 2)
Culture: NO GROWTH
Culture: NO GROWTH

## 2020-08-07 ENCOUNTER — Ambulatory Visit: Payer: Medicare Other | Admitting: Orthopedic Surgery

## 2020-08-11 NOTE — Progress Notes (Addendum)
   08-30-2020 0110  Clinical Encounter Type  Visited With Family;Patient not available  Visit Type Code  Referral From Nurse  Consult/Referral To Chaplain   This chaplain responded to code blue. The patient's wife,(Pauline),  daughter, and daughter-in-law were present. I provided emotional support to his wife. She held my hand and said, "it is in the Lord's hand and if it is his time to go, she understands." The patient's son was in the hallway and felt uncomfortable being at the patient's bedside. The family's pastor arrived, he introduced himself as Barrister's clerk. I advised the family I am available if they need additional assistance. This note was prepared by Jeanine Luz, M.Div..  For questions please contact by phone [phone].

## 2020-08-11 NOTE — Progress Notes (Addendum)
[place] Progress Note Patient Name: Thomas Mcguire DOB: Dec 31, 1940 MRN: [id number]   Date of Service  08-18-20  HPI/Events of Note  Patient with severe multi-factorial shock, including cardiogenic, vasoplegia and also a suggestion of possible third spacing due to capillary leak (reflected in a hemoglobin of  11.1 gm in ESRD patient), Stat ABG also showed severe metabolic acidosis  (PH  7.09, and hypocalcemia (ionized calcium 1.02), patient on the verge of coding with a MAP in the 30's.  eICU Interventions  Epinephrine 500 mcg pushed, calcium chloride 1 amp pushed,  2 amps of sodium bicarbonate pushed and a Bicarb gtt started at 75 ml / hour, 25 % Albumin 25 gm iv x 1 given to address any component of third spacing, blood pressure initially recovered to the low to mid teens SBP but diastolic blood pressure remained in the 26- 36 mmHg range, blood pressure then dropped again with SBP in the low 70's and DBP in the 20's, Giapreza was started to address vasoplegia, echo result was reviewed and aortic and mitral valve regurgitation were mild to moderate, overall prognosis appears grim so I called the patient's spouse Mrs. Theodosia Paling and communicated his dire situation to her, she wanted all measures pursued including CPR / shocks if it came to it, she asked if the family could come to the hospital and , after consultation with the ICU nurses at bed side, I let her know it was okay for them to come in given the life threatening circumstances, I then requested the PCCM Ground Team to come and assume care of the patient, I verified that the vascular surgery attending physician was updated.        Kerry Kass Calixto Pavel August 18, 2020, 12:50 AM

## 2020-08-11 NOTE — Significant Event (Addendum)
CODE BLUE NOTE  CTSP re: hypotension and bradyarrythmia.  The patient developed PEA arrest and CPR was initiated per ACLS protocol.  ROSC was achieved after 20 minutes.  Family was at bedside to witness CPR efforts.  After CPR efforts were discontinued for ROSC, I did update the family that the patient's condition remained tenuous and that ROSC after CPR could not be interpreted as clinically significant improvement.  Rather, the patient now has "all the problems leading up to CPR" in addition to anticipated neurologic complications from requiring 20 minutes of CPR.  They voiced understanding.  The patient's wife was present and decided that the patient's CODE STATUS should be changed to DNR.  Active Problems:   DNR (do not resuscitate)   PAD (peripheral artery disease) ([place])   Acute on chronic combined systolic and diastolic CHF (congestive heart failure) ([place])   DNR (do not resuscitate) discussion   Peripheral arterial occlusive disease ([place])   Acute respiratory failure with hypoxia (HCC)   Atrial fibrillation with RVR (HCC)   Cardiogenic shock (HCC)   Severe sepsis with septic shock (HCC)   Cardiogenic postoperative shock ([place])   Pressure injury of skin  Critical care time: 30 minutes.  The treatment and management of the patient's condition was required based on the threat of imminent deterioration. This time reflects time spent by the physician evaluating, providing care and managing the critically ill patient's care. The time was spent at the immediate bedside (or on the same floor/unit and dedicated to this patient's care). Time involved in separately billable procedures is NOT included int he critical care time indicated above. Family meeting and update time may be included above if and only if the patient is unable/incompetent to participate in clinical interview and/or decision making, and the discussion was necessary to determining treatment decisions.  Renee Pain, MD Board  Certified by the ABIM, [place]

## 2020-08-11 NOTE — Death Summary Note (Signed)
Patient ID: Thomas Mcguire MRN: [id number] DOB/AGE: 1940-06-04 80 y.o.  Admit date: 18-Aug-2020 Date of death: 2020-08-20  Admission Diagnosis: Peripheral arterial occlusive disease ([place]) [I77.9]  Cause of death:  Cardiac arrest secondary to severe congestive heart failure.  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  He was set up for an arteriogram which was performed on 07/24/2020.  He did not have any endovascular options and was felt that his only option for potential limb salvage was a femoral to popliteal artery bypass.  Hospital Course: The patient was admitted on Aug 18, 2020.  He underwent a left common femoral artery to below-knee popliteal artery bypass with a vein graft and also excision of 3 large lymph nodes in the left groin.  He has a history of chronic lymphocytic leukemia.  He initially did well postoperatively and was doing well the evening of surgery.  The following morning he developed atrial fibrillation with a rapid response but this resolved fairly quickly.  He was having problems with low blood pressure and his dialysis had to be stopped because of this.  He  was transferred to the intensive care unit and was found to have significant heart failure.  He underwent an echo which showed an ejection fraction of less than 20%.  He was aggressively resuscitated by critical care medicine.  He developed PEA arrest and CPR was initiated.  This was on 2020-08-20.  Patient's wife was present and the decision was made to stop CPR. Consults:  Treatment Team:  Donato Heinz, MD  Significant Diagnostic Studies:  DG CHEST PORT 1 VIEW  Result Date: 07/30/2020 CLINICAL DATA:  New left internal jugular catheter.  Desaturations. EXAM: PORTABLE CHEST 1 VIEW COMPARISON:  Radiograph earlier today. FINDINGS: Left internal jugular central catheter tip overlies the mid SVC. No pneumothorax. There is increasing right basilar airspace disease with air bronchograms and obscuration of the hemidiaphragm. The endotracheal tube tip is 6.3 cm from the carina. Enteric tube and right dialysis catheter remain in place. Stable heart size and mediastinal contours with aortic atherosclerosis. Left costophrenic angle not entirely included in the field of view, no large left pleural effusion. IMPRESSION: 1. Left internal jugular central catheter with tip overlying the mid SVC. No pneumothorax. 2. Increasing right basilar airspace disease with air bronchograms, suspicious for pneumonia and associated pleural effusions. Electronically Signed   By: Keith Rake M.D.   On: 07/30/2020 15:37   DG CHEST PORT 1 VIEW  Result Date: 07/30/2020 CLINICAL DATA:  Endotracheal tube placement. EXAM: PORTABLE CHEST 1 VIEW COMPARISON:  Same day. FINDINGS: Stable cardiomediastinal silhouette. Right internal jugular  dialysis catheter is unchanged in position. Endotracheal and nasogastric tubes are in grossly good position. No pneumothorax is noted. Left lung is clear. Right lower lobe opacity is noted concerning for atelectasis or possibly infiltrate. Bony thorax is unremarkable. IMPRESSION: Endotracheal and  nasogastric tubes are in grossly good position. Right lower lobe opacity is noted concerning for atelectasis or possibly infiltrate. Electronically Signed   By: Marijo Conception M.D.   On: 07/30/2020 13:18   DG CHEST PORT 1 VIEW  Result Date: 07/30/2020 CLINICAL DATA:  Fever.  Low blood pressure. EXAM: PORTABLE CHEST 1 VIEW COMPARISON:  07/27/2020. FINDINGS: Catheter stable position. Cardiomegaly with pulmonary venous congestion. Mild bilateral interstitial prominence. Mild interstitial edema cannot be excluded. Similar findings noted on prior exam. No pleural effusion or pneumothorax. IMPRESSION: 1.  Dual-lumen catheter stable position. 2. Cardiomegaly with pulmonary venous congestion mild bilateral interstitial prominence suggesting mild interstitial edema. Similar findings noted on prior exam. Electronically Signed   By: Marcello Moores  Register   On: 07/30/2020 07:07   DG Ang/Ext/Uni/Or Left  Result Date: 07/15/2020 CLINICAL DATA:  Intraoperative arteriogram EXAM: LEFT ANG/EXT/UNI/ OR CONTRAST:  See operative note for detail FLUOROSCOPY TIME:  See operative note for detail COMPARISON:  CT abdomen/pelvis 07/27/2020 FINDINGS: A single intraoperative saved images submitted for review. The image demonstrates a patent below the knee popliteal artery bypass graft with retrograde filling of the popliteal artery as well as antegrade filling of the P3 segment of the popliteal artery. The anterior tibial artery is widely patent. The tibioperoneal trunk is patent for approximately 2 cm before occluding. No definite visualization of the posterior tibial or peroneal arteries. IMPRESSION: Intraoperative arteriogram as above. Electronically Signed   By: Jacqulynn Cadet M.D.   On: 08/07/2020 15:24   DG Abd Portable 1V  Result Date: 15-Aug-2020 CLINICAL DATA:  Free intraperitoneal air. Possible small bowel obstruction. EXAM: PORTABLE ABDOMEN - 1 VIEW COMPARISON:  None. FINDINGS: Esophageal catheter tip and side port project  in the low mid abdomen, likely within the J-shaped stomach. No dilated loops of bowel visualized. Lack of upright positioning limits assessment for free intraperitoneal air. IMPRESSION: No visible dilated loops of bowel. Lack of upright positioning limits assessment for free intraperitoneal air. Electronically Signed   By: Ulyses Jarred M.D.   On: Aug 15, 2020 00:37   ECHOCARDIOGRAM COMPLETE  Result Date: 07/30/2020    ECHOCARDIOGRAM REPORT   Patient Name:   BECKEM TOMBERLIN Date of Exam: 07/30/2020 Medical Rec #:  [id number]         Height:       78.0 in Accession #:    [id number]        Weight:       175.3 lb Date of Birth:  [birth year]         BSA:          2.134 m Patient Age:    60 years          BP:           85/46 mmHg Patient Gender: M                 HR:           109 bpm. Exam Location:  Inpatient Procedure: 2D Echo, Color Doppler and Cardiac Doppler STAT ECHO Indications:    Acute MI  History:        Patient has prior history of Echocardiogram examinations, most  recent 11/08/2019. CHF, PAD; Risk Factors:Hypertension,                 Diabetes, Dyslipidemia and ESRD.  Sonographer:    Raquel Sarna Senior RDCS Referring Phys: [id number] Kipp Brood  Sonographer Comments: Scanned supine on artificial respirator. No parasternal window. IMPRESSIONS  1. Left ventricular ejection fraction, by estimation, is <20%. The left ventricle has severely decreased function. The left ventricle demonstrates global hypokinesis. Left ventricular diastolic parameters are consistent with Grade II diastolic dysfunction (pseudonormalization).  2. Right ventricular systolic function is severely reduced. The right ventricular size is mildly enlarged. There is moderately elevated pulmonary artery systolic pressure. The estimated right ventricular systolic pressure is 54.0 mmHg.  3. Left atrial size was moderately dilated.  4. Right atrial size was moderately dilated.  5. The mitral valve is normal in structure. Mild to  moderate mitral valve regurgitation. No evidence of mitral stenosis.  6. Tricuspid valve regurgitation is moderate.  7. The aortic valve is normal in structure. Aortic valve regurgitation is mild. No aortic stenosis is present.  8. The inferior vena cava is normal in size with greater than 50% respiratory variability, suggesting right atrial pressure of 3 mmHg. Comparison(s): Prior images unable to be directly viewed, comparison made by report only. The left ventricular function is worsened. FINDINGS  Left Ventricle: Left ventricular ejection fraction, by estimation, is <20%. The left ventricle has severely decreased function. The left ventricle demonstrates global hypokinesis. The left ventricular internal cavity size was normal in size. There is no  left ventricular hypertrophy. Left ventricular diastolic parameters are consistent with Grade II diastolic dysfunction (pseudonormalization). Right Ventricle: The right ventricular size is mildly enlarged. No increase in right ventricular wall thickness. Right ventricular systolic function is severely reduced. There is moderately elevated pulmonary artery systolic pressure. The tricuspid regurgitant velocity is 2.81 m/s, and with an assumed right atrial pressure of 15 mmHg, the estimated right ventricular systolic pressure is 08.6 mmHg. Left Atrium: Left atrial size was moderately dilated. Right Atrium: Right atrial size was moderately dilated. Pericardium: There is no evidence of pericardial effusion. Mitral Valve: The mitral valve is normal in structure. There is mild thickening of the mitral valve leaflet(s). There is mild calcification of the mitral valve leaflet(s). Mild to moderate mitral valve regurgitation. No evidence of mitral valve stenosis. Tricuspid Valve: The tricuspid valve is normal in structure. Tricuspid valve regurgitation is moderate . No evidence of tricuspid stenosis. Aortic Valve: The aortic valve is normal in structure. Aortic valve  regurgitation is mild. No aortic stenosis is present. Pulmonic Valve: The pulmonic valve was normal in structure. Pulmonic valve regurgitation is not visualized. No evidence of pulmonic stenosis. Aorta: The aortic root is normal in size and structure. Venous: The inferior vena cava is normal in size with greater than 50% respiratory variability, suggesting right atrial pressure of 3 mmHg. IAS/Shunts: There is redundancy of the interatrial septum. No atrial level shunt detected by color flow Doppler.  LEFT VENTRICLE PLAX 2D LVOT diam:     2.20 cm LV SV:         42 LV SV Index:   20 LVOT Area:     3.80 cm  LV Volumes (MOD) LV vol d, MOD A2C: 178.0 ml LV vol d, MOD A4C: 200.0 ml LV vol s, MOD A2C: 148.0 ml LV vol s, MOD A4C: 170.0 ml LV SV MOD A2C:     30.0 ml LV SV MOD A4C:     200.0 ml LV SV MOD  BP:      30.7 ml RIGHT VENTRICLE RV S prime:     4.13 cm/s TAPSE (M-mode): 0.9 cm LEFT ATRIUM              Index       RIGHT ATRIUM           Index LA Vol (A2C):   111.0 ml 52.01 ml/m RA Area:     14.50 cm LA Vol (A4C):   86.1 ml  40.34 ml/m RA Volume:   36.90 ml  17.29 ml/m LA Biplane Vol: 98.1 ml  45.96 ml/m  AORTIC VALVE LVOT Vmax:   59.90 cm/s LVOT Vmean:  44.300 cm/s LVOT VTI:    0.110 m MITRAL VALVE               TRICUSPID VALVE MV Area (PHT): 4.65 cm    TR Peak grad:   31.6 mmHg MV Decel Time: 163 msec    TR Vmax:        281.00 cm/s MV E velocity: 97.50 cm/s                            SHUNTS                            Systemic VTI:  0.11 m                            Systemic Diam: 2.20 cm Candee Furbish MD Electronically signed by Candee Furbish MD Signature Date/Time: 07/30/2020/1:36:19 PM    Final     CBC    Component Value Date/Time   WBC 54.0 (HH) 07/30/2020 1750   RBC 3.81 (L) 07/30/2020 1750   HGB 9.5 (L) 08-19-20 0133   HGB 12.1 (L) 05/30/2020 1106   HCT 28.0 (L) 08/19/20 0133   PLT 102 (L) 07/30/2020 1750   PLT 89 (L) 05/30/2020 1106   MCV 97.6 07/30/2020 1750   MCH 29.1 07/30/2020 1750   MCHC  29.8 (L) 07/30/2020 1750   RDW 16.2 (H) 07/30/2020 1750   LYMPHSABS 7.5 (H) 05/30/2020 1106   MONOABS 0.6 05/30/2020 1106   EOSABS 0.1 05/30/2020 1106   BASOSABS 0.0 05/30/2020 1106    BMET    Component Value Date/Time   NA 143 2020-08-19 0133   K 4.6 08/19/2020 0133   CL 105 19-Aug-2020 0130   CO2 16 (L) August 19, 2020 0130   GLUCOSE 102 (H) August 19, 2020 0130   BUN 40 (H) Aug 19, 2020 0130   CREATININE 4.43 (H) 2020/08/19 0130   CREATININE 5.72 (HH) 12/28/2017 1009   CALCIUM 7.2 (L) 19-Aug-2020 0130   GFRNONAA 13 (L) 2020-08-19 0130   GFRNONAA 9 (L) 12/28/2017 1009   GFRAA 12 (L) 09/20/2018 1013   GFRAA 10 (L) 12/28/2017 1009    COAG Lab Results  Component Value Date   INR 1.1 07/28/2020   INR 1.4 (H) 11/08/2019   INR 1.2 07/11/2018   No results found for: PTT  Disposition:  There are no questions and answers to display.            Signed: Deitra Mayo August 19, 2020, 8:34 AM

## 2020-08-11 NOTE — Progress Notes (Signed)
IVT: Responded to code blue announcement. Pt with multiple IV access. Code blue resuscitative measures in progress.

## 2020-08-11 NOTE — Progress Notes (Signed)
Vascular surgery on-call MD made aware of patient's deteriorating status. No new orders.

## 2020-08-11 DEATH — deceased

## 2020-08-13 ENCOUNTER — Other Ambulatory Visit: Payer: Medicare Other

## 2020-08-14 ENCOUNTER — Other Ambulatory Visit: Payer: Medicare Other

## 2020-08-16 MED FILL — Medication: Qty: 1 | Status: AC

## 2020-08-19 ENCOUNTER — Ambulatory Visit: Payer: Medicare Other | Admitting: Oncology

## 2020-08-19 ENCOUNTER — Other Ambulatory Visit: Payer: Medicare Other

## 2020-08-28 ENCOUNTER — Ambulatory Visit: Payer: Medicare Other | Admitting: Oncology

## 2020-08-28 ENCOUNTER — Other Ambulatory Visit: Payer: Medicare Other

## 2020-08-29 ENCOUNTER — Ambulatory Visit: Payer: Medicare Other | Admitting: Oncology

## 2020-08-29 ENCOUNTER — Other Ambulatory Visit: Payer: Medicare Other

## 2020-09-04 IMAGING — MR MR PROSTATE W/O CM
10 series · 48 of 48 positions shown · non-contrast
Comparison: None.

CLINICAL DATA: Diagnosed with prostate cancer 1 year ago. PSA of
7.0. No treatment. Watchful waiting.

EXAM:
MR PROSTATE WITHOUT CONTRAST
TECHNIQUE: Multiplanar multisequence MRI images were obtained of the pelvis
centered about the prostate, without contrast administration.

[Series 3: T1 · axial · 8.0mm · 1.06mm/px · z∈[-180,+90]mm · 5 of 28 slices shown (1 of 2)]
[im 1/28]
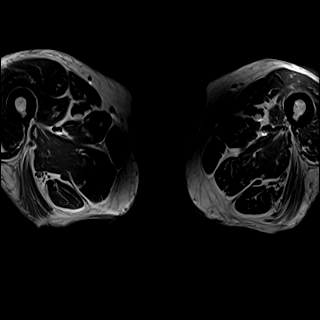
[im 7/28]
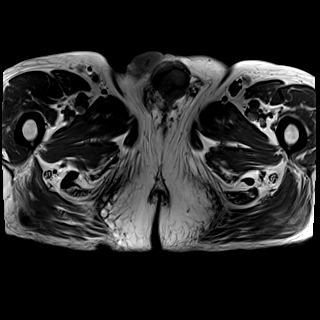
[im 14/28]
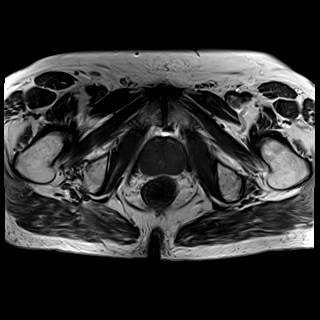
[im 21/28]
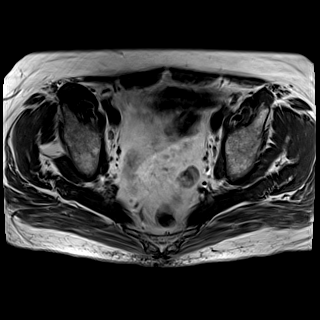
[im 28/28]
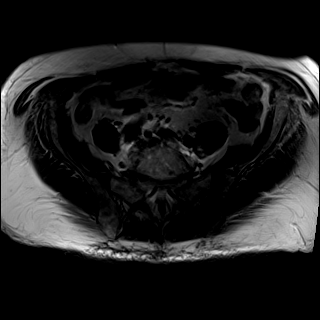

[Series 4: bSSFP fat-sat · axial · 8.0mm · 0.74mm/px · z∈[-180,+90]mm · 4 of 28 slices shown]
[im 1/28]
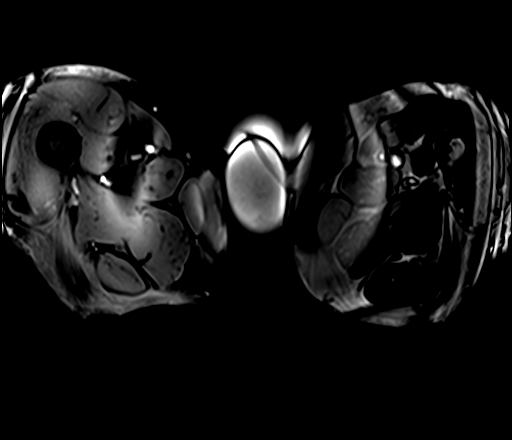
[im 10/28]
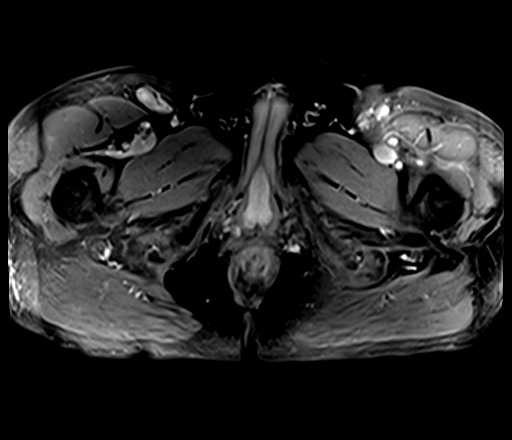
[im 19/28]
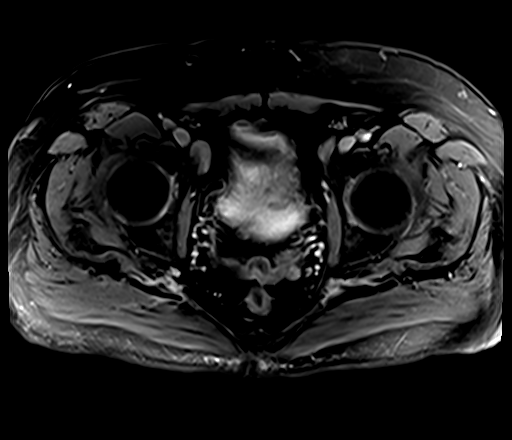
[im 28/28]
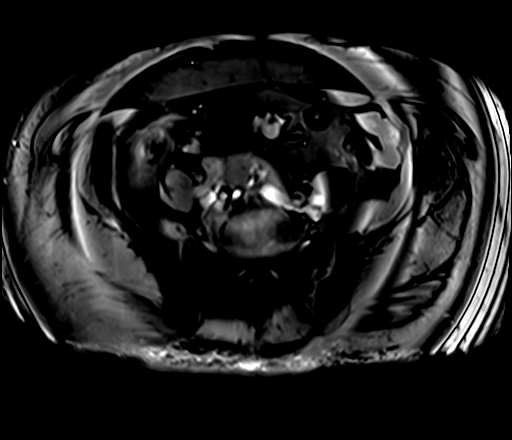

[Series 5: T2 · sagittal · 3.5mm · 0.56mm/px · 5 of 39 slices shown (1 of 4)]
[im 1/39]
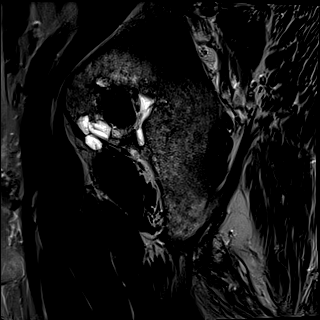
[im 10/39]
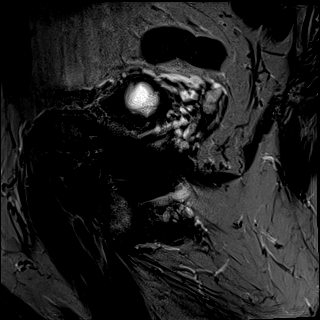
[im 20/39]
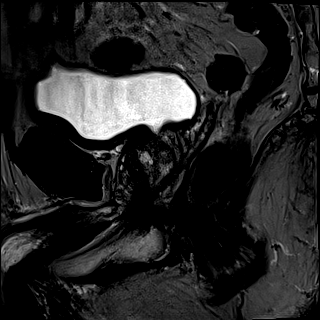
[im 29/39]
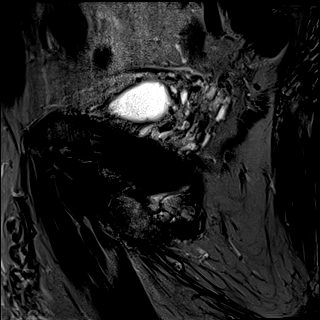
[im 39/39]
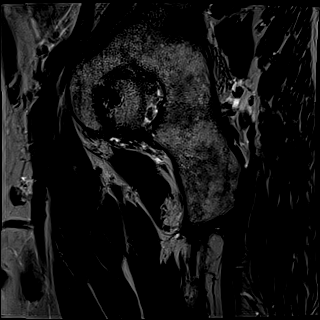

[Series 6: T1 · axial · 3.0mm · 0.31mm/px · z∈[-88,-8]mm · 3 of 24 slices shown (2 of 2)]
[im 1/24]
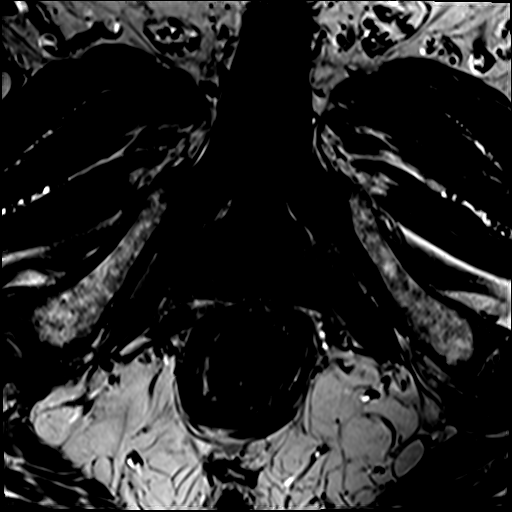
[im 12/24]
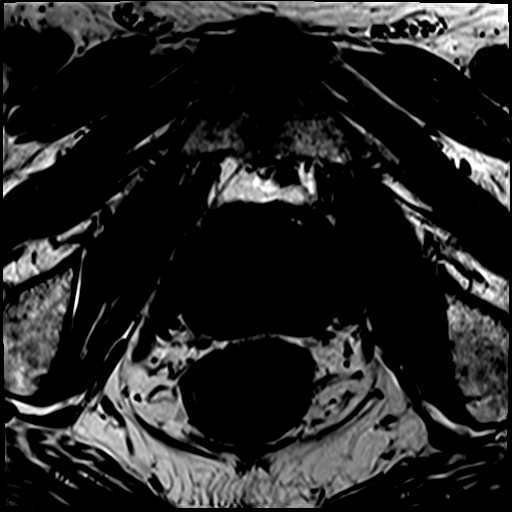
[im 24/24]
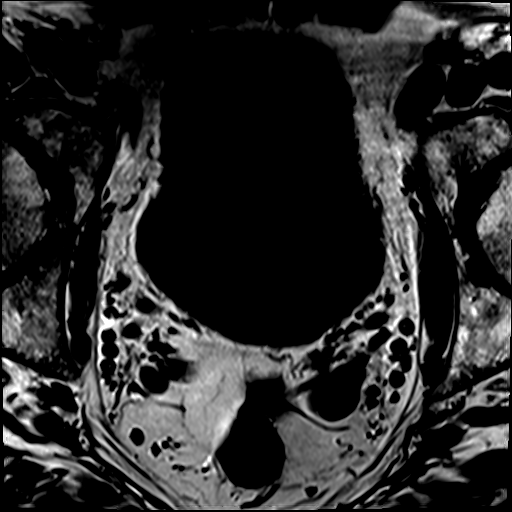

[Series 7: T2 · axial · 3.5mm · 0.56mm/px · z∈[-87,-10]mm · 3 of 23 slices shown (2 of 4)]
[im 1/23]
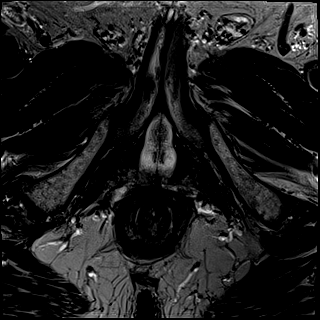
[im 12/23]
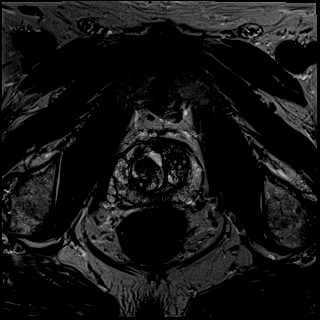
[im 23/23]
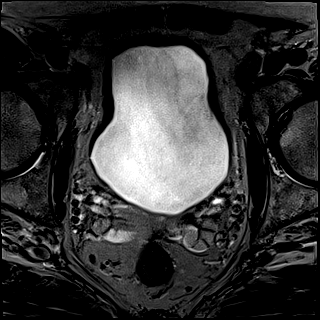

[Series 8: T2 · axial · 1.0mm · 1.04mm/px · z∈[-88,-9]mm · 11 of 80 slices shown (3 of 4)]
[im 1/80]
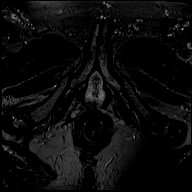
[im 8/80]
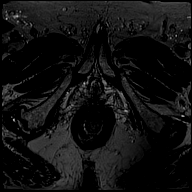
[im 16/80]
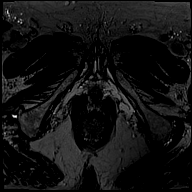
[im 24/80]
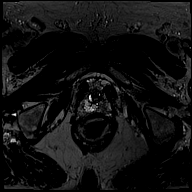
[im 32/80]
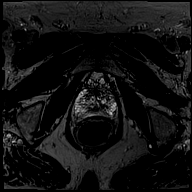
[im 40/80]
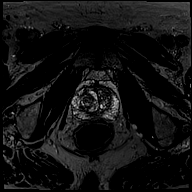
[im 48/80]
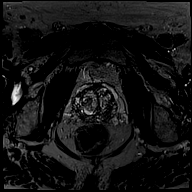
[im 56/80]
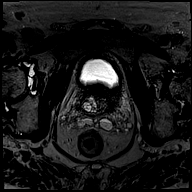
[im 64/80]
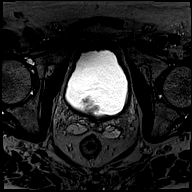
[im 72/80]
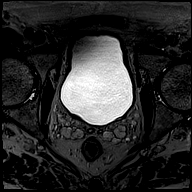
[im 80/80]
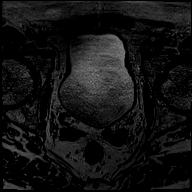

[Series 9: T2 · coronal · 3.5mm · 0.56mm/px · 3 of 23 slices shown (4 of 4)]
[im 1/23]
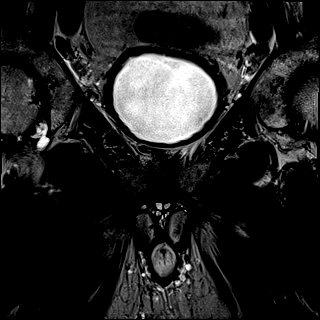
[im 12/23]
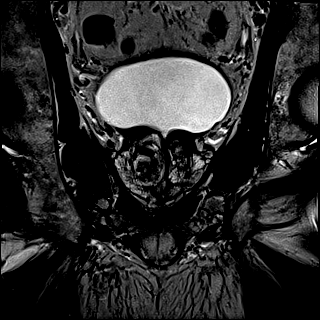
[im 23/23]
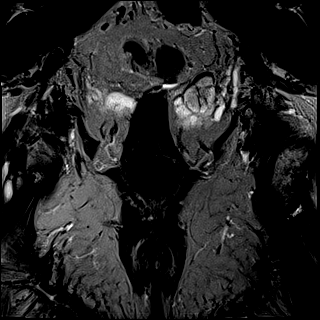

[Series 10: DWI · axial · 3.5mm · 1.56mm/px · z∈[-81,-15]mm · 8 of 57 slices shown (1 of 2)]
[im 1/57]
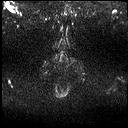
[im 9/57]
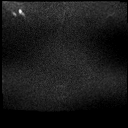
[im 17/57]
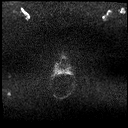
[im 25/57]
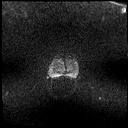
[im 33/57]
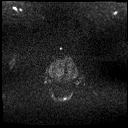
[im 41/57]
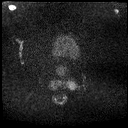
[im 49/57]
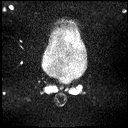
[im 57/57]
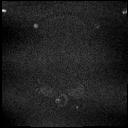

[Series 11: DWI · axial · 3.5mm · 1.56mm/px · z∈[-81,-15]mm · 3 of 20 slices shown (2 of 2)]
[im 1/20]
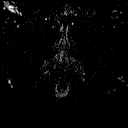
[im 10/20]
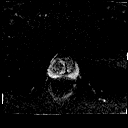
[im 20/20]
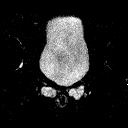

[Series 12: pre t1_twist_tra_dyn_ttc=5.3s · axial · non-contrast · 3.5mm · 0.83mm/px · z∈[-81,-15]mm · 3 of 20 slices shown]
[im 1/20]
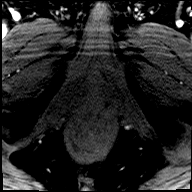
[im 10/20]
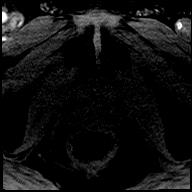
[im 20/20]
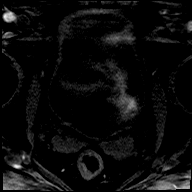

[48 of 48 positions shown; findings below may reference images not displayed]

FINDINGS: Prostate: Mild central gland enlargement and heterogeneity,
consistent with benign prostatic hyperplasia. No suspicious central
gland nodule.

No areas of peripheral zone masslike T2 hypointensity or restricted
diffusion. Postcontrast images could not be performed secondary to
elevated creatinine.

Note is made of foci of central gland T1 hyperintensity on series 6
which are likely related to prior biopsy.

Volume: 3.6 x 5.0 x 3.8 cm (volume = 36 cm^3)

Transcapsular spread:  Absent

Seminal vesicle involvement: Absent

Neurovascular bundle involvement: Absent

Pelvic adenopathy: Present. Example right external iliac node at
cm on image [DATE]. Left external iliac node at 1.2 cm on image [DATE].

Bone metastasis: Absent

Other findings: No significant free fluid. Scattered colonic
diverticula.
IMPRESSION: 1. No findings of high-grade or macroscopic prostate carcinoma. Mild
limitation secondary to lack of IV contrast (withheld secondary to
elevated creatinine).
2. Bilateral pelvic sidewall adenopathy, greater on the right.
Although this could represent metastatic disease from relatively
low-grade prostate carcinoma, differential considerations include
lymphoproliferative process or reactive adenopathy. Correlate with
prior cross-sectional imaging, if available. If not available,
potential clinical strategies include PET or dedicated chest abdomen
and pelvic CTs.

These results will be called to the ordering clinician or
representative by the Radiologist Assistant, and communication
documented in the PACS or zVision Dashboard.

## 2020-09-29 ENCOUNTER — Other Ambulatory Visit (HOSPITAL_COMMUNITY): Payer: Self-pay

## 2020-10-14 ENCOUNTER — Other Ambulatory Visit: Payer: Self-pay | Admitting: Oncology

## 2020-10-14 DIAGNOSIS — D649 Anemia, unspecified: Secondary | ICD-10-CM

## 2020-10-14 DIAGNOSIS — E538 Deficiency of other specified B group vitamins: Secondary | ICD-10-CM

## 2020-10-14 DIAGNOSIS — C9110 Chronic lymphocytic leukemia of B-cell type not having achieved remission: Secondary | ICD-10-CM
